# Patient Record
Sex: Female | Born: 1935 | Race: White | Hispanic: No | Smoking: Never smoker
Health system: Southern US, Community
[De-identification: ages and names within clinical notes are randomized; demographics above are authoritative.]

## PROBLEM LIST (undated history)

## (undated) DIAGNOSIS — K224 Dyskinesia of esophagus: Secondary | ICD-10-CM

## (undated) DIAGNOSIS — E1129 Type 2 diabetes mellitus with other diabetic kidney complication: Principal | ICD-10-CM

## (undated) DIAGNOSIS — L309 Dermatitis, unspecified: Secondary | ICD-10-CM

## (undated) DIAGNOSIS — Z950 Presence of cardiac pacemaker: Secondary | ICD-10-CM

## (undated) DIAGNOSIS — I452 Bifascicular block: Secondary | ICD-10-CM

## (undated) DIAGNOSIS — Z9181 History of falling: Secondary | ICD-10-CM

## (undated) DIAGNOSIS — K08109 Complete loss of teeth, unspecified cause, unspecified class: Secondary | ICD-10-CM

## (undated) DIAGNOSIS — I639 Cerebral infarction, unspecified: Secondary | ICD-10-CM

## (undated) DIAGNOSIS — F411 Generalized anxiety disorder: Secondary | ICD-10-CM

## (undated) DIAGNOSIS — I5032 Chronic diastolic (congestive) heart failure: Secondary | ICD-10-CM

## (undated) DIAGNOSIS — D649 Anemia, unspecified: Secondary | ICD-10-CM

## (undated) DIAGNOSIS — E785 Hyperlipidemia, unspecified: Secondary | ICD-10-CM

## (undated) DIAGNOSIS — I350 Nonrheumatic aortic (valve) stenosis: Secondary | ICD-10-CM

## (undated) DIAGNOSIS — N183 Chronic kidney disease, stage 3 unspecified: Secondary | ICD-10-CM

## (undated) DIAGNOSIS — K219 Gastro-esophageal reflux disease without esophagitis: Secondary | ICD-10-CM

## (undated) DIAGNOSIS — L299 Pruritus, unspecified: Secondary | ICD-10-CM

## (undated) DIAGNOSIS — M545 Low back pain, unspecified: Secondary | ICD-10-CM

## (undated) DIAGNOSIS — E611 Iron deficiency: Secondary | ICD-10-CM

## (undated) DIAGNOSIS — H9190 Unspecified hearing loss, unspecified ear: Secondary | ICD-10-CM

## (undated) DIAGNOSIS — R269 Unspecified abnormalities of gait and mobility: Secondary | ICD-10-CM

## (undated) DIAGNOSIS — M25519 Pain in unspecified shoulder: Secondary | ICD-10-CM

## (undated) DIAGNOSIS — I219 Acute myocardial infarction, unspecified: Secondary | ICD-10-CM

## (undated) DIAGNOSIS — I10 Essential (primary) hypertension: Secondary | ICD-10-CM

## (undated) DIAGNOSIS — E1165 Type 2 diabetes mellitus with hyperglycemia: Principal | ICD-10-CM

## (undated) DIAGNOSIS — K648 Other hemorrhoids: Secondary | ICD-10-CM

## (undated) DIAGNOSIS — I480 Paroxysmal atrial fibrillation: Secondary | ICD-10-CM

## (undated) DIAGNOSIS — N393 Stress incontinence (female) (male): Secondary | ICD-10-CM

## (undated) DIAGNOSIS — Z973 Presence of spectacles and contact lenses: Secondary | ICD-10-CM

## (undated) DIAGNOSIS — N390 Urinary tract infection, site not specified: Secondary | ICD-10-CM

## (undated) DIAGNOSIS — Z972 Presence of dental prosthetic device (complete) (partial): Secondary | ICD-10-CM

## (undated) DIAGNOSIS — I951 Orthostatic hypotension: Secondary | ICD-10-CM

## (undated) DIAGNOSIS — I739 Peripheral vascular disease, unspecified: Secondary | ICD-10-CM

## (undated) DIAGNOSIS — E669 Obesity, unspecified: Secondary | ICD-10-CM

## (undated) DIAGNOSIS — R945 Abnormal results of liver function studies: Secondary | ICD-10-CM

## (undated) DIAGNOSIS — M1990 Unspecified osteoarthritis, unspecified site: Secondary | ICD-10-CM

## (undated) DIAGNOSIS — I779 Disorder of arteries and arterioles, unspecified: Secondary | ICD-10-CM

## (undated) DIAGNOSIS — F3289 Other specified depressive episodes: Secondary | ICD-10-CM

## (undated) DIAGNOSIS — R413 Other amnesia: Secondary | ICD-10-CM

## (undated) DIAGNOSIS — I2510 Atherosclerotic heart disease of native coronary artery without angina pectoris: Secondary | ICD-10-CM

## (undated) DIAGNOSIS — F329 Major depressive disorder, single episode, unspecified: Secondary | ICD-10-CM

## (undated) DIAGNOSIS — Z7901 Long term (current) use of anticoagulants: Secondary | ICD-10-CM

## (undated) HISTORY — DX: Chronic kidney disease, stage 3 unspecified: N18.30

## (undated) HISTORY — DX: Generalized anxiety disorder: F41.1

## (undated) HISTORY — DX: Pruritus, unspecified: L29.9

## (undated) HISTORY — DX: Bifascicular block: I45.2

## (undated) HISTORY — DX: Pain in unspecified shoulder: M25.519

## (undated) HISTORY — DX: Stress incontinence (female) (male): N39.3

## (undated) HISTORY — DX: Anemia, unspecified: D64.9

## (undated) HISTORY — DX: Chronic kidney disease, stage 3 (moderate): N18.3

## (undated) HISTORY — DX: History of falling: Z91.81

## (undated) HISTORY — DX: Orthostatic hypotension: I95.1

## (undated) HISTORY — DX: Low back pain, unspecified: M54.50

## (undated) HISTORY — DX: Dermatitis, unspecified: L30.9

## (undated) HISTORY — DX: Other hemorrhoids: K64.8

## (undated) HISTORY — DX: Abnormal results of liver function studies: R94.5

## (undated) HISTORY — DX: Type 2 diabetes mellitus with other diabetic kidney complication: E11.29

## (undated) HISTORY — PX: COLONOSCOPY: SHX174

## (undated) HISTORY — DX: Nonrheumatic aortic (valve) stenosis: I35.0

## (undated) HISTORY — DX: Other specified depressive episodes: F32.89

## (undated) HISTORY — PX: EXTREMITY CYST EXCISION: SHX1558

## (undated) HISTORY — PX: CATARACT EXTRACTION W/ INTRAOCULAR LENS  IMPLANT, BILATERAL: SHX1307

## (undated) HISTORY — PX: INSERT / REPLACE / REMOVE PACEMAKER: SUR710

## (undated) HISTORY — DX: Obesity, unspecified: E66.9

## (undated) HISTORY — DX: Low back pain: M54.5

## (undated) HISTORY — PX: EYE SURGERY: SHX253

## (undated) HISTORY — DX: Essential (primary) hypertension: I10

## (undated) HISTORY — DX: Other amnesia: R41.3

## (undated) HISTORY — DX: Unspecified abnormalities of gait and mobility: R26.9

## (undated) HISTORY — DX: Type 2 diabetes mellitus with hyperglycemia: E11.65

## (undated) HISTORY — DX: Hyperlipidemia, unspecified: E78.5

## (undated) HISTORY — DX: Major depressive disorder, single episode, unspecified: F32.9

---

## 1998-02-05 ENCOUNTER — Ambulatory Visit (HOSPITAL_BASED_OUTPATIENT_CLINIC_OR_DEPARTMENT_OTHER): Admission: RE | Admit: 1998-02-05 | Discharge: 1998-02-05 | Payer: Self-pay | Admitting: Orthopedic Surgery

## 2000-10-23 ENCOUNTER — Emergency Department (HOSPITAL_COMMUNITY): Admission: EM | Admit: 2000-10-23 | Discharge: 2000-10-23 | Payer: Self-pay | Admitting: Emergency Medicine

## 2000-10-23 ENCOUNTER — Encounter: Payer: Self-pay | Admitting: Emergency Medicine

## 2001-08-20 ENCOUNTER — Encounter: Payer: Self-pay | Admitting: Emergency Medicine

## 2001-08-20 ENCOUNTER — Emergency Department (HOSPITAL_COMMUNITY): Admission: EM | Admit: 2001-08-20 | Discharge: 2001-08-20 | Payer: Self-pay | Admitting: Emergency Medicine

## 2002-01-11 ENCOUNTER — Encounter (INDEPENDENT_AMBULATORY_CARE_PROVIDER_SITE_OTHER): Payer: Self-pay | Admitting: *Deleted

## 2002-01-11 ENCOUNTER — Ambulatory Visit (HOSPITAL_BASED_OUTPATIENT_CLINIC_OR_DEPARTMENT_OTHER): Admission: RE | Admit: 2002-01-11 | Discharge: 2002-01-11 | Payer: Self-pay | Admitting: Orthopedic Surgery

## 2003-12-02 ENCOUNTER — Inpatient Hospital Stay (HOSPITAL_COMMUNITY): Admission: AD | Admit: 2003-12-02 | Discharge: 2003-12-03 | Payer: Self-pay | Admitting: Emergency Medicine

## 2003-12-03 HISTORY — PX: CARDIAC CATHETERIZATION: SHX172

## 2005-06-11 ENCOUNTER — Emergency Department (HOSPITAL_COMMUNITY): Admission: EM | Admit: 2005-06-11 | Discharge: 2005-06-11 | Payer: Self-pay | Admitting: Emergency Medicine

## 2007-03-16 ENCOUNTER — Emergency Department (HOSPITAL_COMMUNITY): Admission: EM | Admit: 2007-03-16 | Discharge: 2007-03-16 | Payer: Self-pay | Admitting: Emergency Medicine

## 2009-08-14 ENCOUNTER — Emergency Department (HOSPITAL_COMMUNITY): Admission: EM | Admit: 2009-08-14 | Discharge: 2009-08-15 | Payer: Self-pay | Admitting: Emergency Medicine

## 2010-03-27 ENCOUNTER — Emergency Department (HOSPITAL_COMMUNITY): Admission: EM | Admit: 2010-03-27 | Discharge: 2010-03-27 | Payer: Self-pay | Admitting: Emergency Medicine

## 2010-05-04 ENCOUNTER — Emergency Department (HOSPITAL_COMMUNITY): Admission: EM | Admit: 2010-05-04 | Discharge: 2010-05-04 | Payer: Self-pay | Admitting: Oncology

## 2010-12-26 LAB — RAPID URINE DRUG SCREEN, HOSP PERFORMED
Barbiturates: NOT DETECTED
Benzodiazepines: POSITIVE — AB
Cocaine: NOT DETECTED
Opiates: NOT DETECTED
Tetrahydrocannabinol: NOT DETECTED

## 2010-12-26 LAB — DIFFERENTIAL
Basophils Relative: 1 % (ref 0–1)
Eosinophils Absolute: 0.2 10*3/uL (ref 0.0–0.7)
Eosinophils Relative: 3 % (ref 0–5)
Lymphocytes Relative: 33 % (ref 12–46)
Monocytes Relative: 8 % (ref 3–12)
Neutro Abs: 3.5 10*3/uL (ref 1.7–7.7)

## 2010-12-26 LAB — BASIC METABOLIC PANEL
CO2: 25 mEq/L (ref 19–32)
Calcium: 9.5 mg/dL (ref 8.4–10.5)
Chloride: 109 mEq/L (ref 96–112)
Potassium: 4.2 mEq/L (ref 3.5–5.1)

## 2010-12-26 LAB — CBC
MCV: 90.9 fL (ref 78.0–100.0)
RBC: 4.19 MIL/uL (ref 3.87–5.11)
RDW: 16.9 % — ABNORMAL HIGH (ref 11.5–15.5)

## 2010-12-26 LAB — SALICYLATE LEVEL: Salicylate Lvl: <4 mg/dL (ref 2.8–20.0)

## 2010-12-26 LAB — ETHANOL: Alcohol, Ethyl (B): <5 mg/dL (ref 0–10)

## 2010-12-26 LAB — TRICYCLICS SCREEN, URINE: TCA Scrn: NOT DETECTED

## 2011-01-13 LAB — URINALYSIS, ROUTINE W REFLEX MICROSCOPIC
Bilirubin Urine: NEGATIVE
Glucose, UA: NEGATIVE mg/dL
Hgb urine dipstick: NEGATIVE
Ketones, ur: NEGATIVE mg/dL
Nitrite: NEGATIVE
Protein, ur: 30 mg/dL — AB
Specific Gravity, Urine: 1.024 (ref 1.005–1.030)
Urobilinogen, UA: 1 mg/dL (ref 0.0–1.0)
pH: 6 (ref 5.0–8.0)

## 2011-01-13 LAB — DIFFERENTIAL
Basophils Relative: 1 % (ref 0–1)
Eosinophils Absolute: 0.2 10*3/uL (ref 0.0–0.7)
Lymphs Abs: 3.5 10*3/uL (ref 0.7–4.0)
Monocytes Absolute: 0.7 10*3/uL (ref 0.1–1.0)
Neutro Abs: 3.4 10*3/uL (ref 1.7–7.7)

## 2011-01-13 LAB — BASIC METABOLIC PANEL WITH GFR
BUN: 19 mg/dL (ref 6–23)
CO2: 29 meq/L (ref 19–32)
Calcium: 9.5 mg/dL (ref 8.4–10.5)
Chloride: 102 meq/L (ref 96–112)
Creatinine, Ser: 0.96 mg/dL (ref 0.4–1.2)
GFR calc non Af Amer: 57 mL/min — ABNORMAL LOW
Glucose, Bld: 142 mg/dL — ABNORMAL HIGH (ref 70–99)
Potassium: 3.9 meq/L (ref 3.5–5.1)
Sodium: 138 meq/L (ref 135–145)

## 2011-01-13 LAB — RAPID URINE DRUG SCREEN, HOSP PERFORMED
Amphetamines: NOT DETECTED
Barbiturates: NOT DETECTED
Benzodiazepines: NOT DETECTED
Opiates: NOT DETECTED

## 2011-01-13 LAB — CBC
HCT: 39.5 % (ref 36.0–46.0)
Hemoglobin: 13.2 g/dL (ref 12.0–15.0)
MCHC: 33.4 g/dL (ref 30.0–36.0)
MCV: 91.1 fL (ref 78.0–100.0)
Platelets: 152 10*3/uL (ref 150–400)
RBC: 4.34 MIL/uL (ref 3.87–5.11)
RDW: 15.1 % (ref 11.5–15.5)
WBC: 7.8 10*3/uL (ref 4.0–10.5)

## 2011-01-13 LAB — URINE MICROSCOPIC-ADD ON

## 2011-01-13 LAB — ETHANOL

## 2011-02-26 NOTE — Op Note (Signed)
[name]. [facility]  Patient:    Danielle Benitez, Danielle Benitez Visit Number: [id number] MRN: [id number]          Service Type: DSU Location: [facility] Attending Physician:  Ronne Binning Dictated by:   Nicki Reaper, M.D. Proc. Date: 01/11/02 Admit Date:  01/11/2002 Discharge Date: 01/11/2002   CC:         (2)   Operative Report  PREOPERATIVE DIAGNOSIS: Mass, right middle finger.  POSTOPERATIVE DIAGNOSIS: Mass, right middle finger.  OPERATION/PROCEDURE: Excision of mass of distal interphalangeal joint and debridement of joint, right middle finger.  SURGEON: Nicki Reaper, M.D.  ASSISTANT: Joaquin Courts, R.N.  ANESTHESIA: Forearm-based IV regional.  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  DESCRIPTION OF PROCEDURE: The patient was brought to the operating room, where a forearm-based IV regional anesthetic was carried out without difficulty. She was prepped and draped using Betadine scrubbing solution with the right arm free in the supine position.  A curvilinear incision was made over the mass and carried down through subcutaneous tissue.  A large gouty tophus was immediately encountered.  With blunt and sharp dissection this was dissected free, having eroded through the radial side of the capsule.  A large exostosis was present.  This was removed with a rongeur, including a portion of bone. The remainder of the joint was inspected and no further lesions were identified.  The wound was irrigated.  The skin was then closed with interrupted 5-0 nylon suture.  A sterile compressive dressing and splint were applied to the finger.  The patient tolerated the procedure well and was taken to the recovery room for observation in satisfactory condition.  She is discharged home to return to the [facility] in one week, on Vicodin and Keflex. Dictated by:   Nicki Reaper, M.D. Attending Physician:  Ronne Binning DD:  01/11/02 TD:  01/11/02 Job: [id number]

## 2011-02-26 NOTE — H&P (Signed)
NAME:  Danielle Benitez, Danielle Benitez NO.:  [id number]   MEDICAL RECORD NO.:  [id number]                   PATIENT TYPE:  EMS   LOCATION:  [facility]                                 FACILITY:  [facility]   PHYSICIAN:  Rosanne Sack, M.D.         DATE OF BIRTH:  04/24/1932   DATE OF ADMISSION:  12/02/2003  DATE OF DISCHARGE:                                HISTORY & PHYSICAL   CHIEF COMPLAINT:  Chest pain and tightness.   PROBLEM LIST:  1. Chest pain at rest with radiation to the left arm, left arm numbness but     no diaphoresis on December 01, 2003.  This is now followed with sensation     of chest tightness.     a. History of chest pain distantly with emergency room visit at that        time.  She was discharged home with instructions to follow with Dr.        Lucas Mallow, cardiology but did not see him.  She had sinus bradycardia per        electrocardiogram in 2002 at 48 per minute.  2. Complaints of dysuria, rule out urinary tract infection.  Patient     complaining of frequency and burning.  3. Uncontrolled diabetes.  Serum glucose 188.     a. History of diabetes on oral agents only.  Does not follow home CBG's.  4. Normocytic anemia unclear baseline hemoglobin, current hemoglobin 11.2.  5. Uncontrolled hypertension, systolic blood pressures in the 170's.  6. Non healed ulcers lower abdomen and legs the patient states from old bug     bites.  7. History diverticulosis and hemorrhoids.     a. Flexible sigmoidoscopy August 1989 noting diverticulosis.     b. Barium enema 1987 noting diverticulosis.  8. History of abnormal liver function tests in 1991.  9. History of gout.  10.      History of allergic conjunctivitis.  11.      History chronic back pain.  12.      History osteoarthritis with chronic knee pain.  13.      History fibromyalgia.  14.      Obesity.  15.      ALLERGIES/intolerance's:  Had bad reaction to beta blocker.  16.      CODE STATUS:  Patient  currently full code.   HISTORY OF PRESENT ILLNESS:  This 77 year old female is followed as an  outpatient by Dr. Murray Hodgkins of [facility].  She lives at  home independently.  She is accompanied to the emergency room by her  daughter.  She states she had central chest pain on December 01, 2003.  She  states this was at rest while sitting at a table. There was radiation of  pain to the left arm and then left arm numbness.  She denies diaphoresis or  radiation to the jaw.  She  states that the pain gradually subsided and then  was followed with a sensation of chest tightness which she still currently  feels. She also states she has been having symptomatology consistent with  dysuria with increased frequency and burning.  Finally, she has complaints  of some chronic ulcers of lower abdomen and lower legs bilaterally from old  bug bites.  She came to the emergency room today to have her chest  pain/tightness evaluated.   FAMILY HISTORY:  Noncontributory in this 77 year old female.   SOCIAL HISTORY:  The patient recently widowed in November of 2004.  She has  two children. She has no history of alcohol or tobacco use.  She is followed  in primary care by Dr. Chilton Si.   REVIEW OF SYMPTOMS:  HEENT:  Denies any fever or weight change.  Denies any  falls or trauma.  Denies any syncope associated with her complaints of chest  pain.  EYES:  Wears corrective lenses.  Has had no previous eye surgery.  Ears with no hearing  deficit or use of hearing aids.  Nose with no rhinitis  or sinusitis.  Mouth/throat:  No pain with swallowing.  Denies dysphagia.  HEART:  As described under history of present illness.  The patient also has  a listed history of hypertension which is currently uncontrolled.  LUNGS:  No shortness of breath or cough.  ABDOMEN:  No nausea or vomiting.  No  abdominal pain.  No tarry stools or frank rectal bleeding.  Does have a  listed history of diverticulosis.   UROGENITAL:  As under history of present  illness.  MUSCULOSKELETAL:  Chronic low back and knee pain with listed  history of osteoarthritis.  Denies any falls or trauma.  NEUROLOGICAL:  No  history of cerebrovascular accident or seizure.  Denies peripheral  neuropathy.  INTEGUMENTARY:  Some chronic nonhealing ulcers of lower abdomen  and lower extremities from old bug bites.  Denies any skin cancers.   MEDICATIONS AT TIME OF ADMISSION:  1. Lotrel 5/20 mg one tablet daily.  2. DiaBeta 2.5 mg taking one-half tablet daily in A.M.  3. ___________ for loose stools p.r.n.  4. Tylenol PM as needed.  5. Aleve twice daily.  6. Allopurinol 300 mg daily.  7. Claritin 10 mg daily as needed.  8. Gas-X as needed.   LABORATORY DATA:  Chest x-ray notes bibasilar atelectasis.  Twelve lead  electrocardiogram in 2002 noted sinus bradycardia with a left anterior  block.  Today's electrocardiogram notes rate of 60 per minute, normal sinus  rhythm with left anterior fascicular block.  ISTAT blood gas ph 7.410, pCO2  43.7, bicarb 27.7, total cO2 29.  ISTAT metabolic values sodium 138,  potassium 4.1, chloride 102, BUN 14, creatinine 1.  Glucose was elevated at  188.  A CBC found white blood cell count 5.9, hemoglobin 11.2, hematocrit  34.9, platelet count 222,000.  Differential was unremarkable.  Emergency  room cardiac enzymes X3 sets were unremarkable with CK-MB initially 1.2 and  trending down to less than 1.  Troponin levels all three less than 0.05.  Myoglobin's were unremarkable.   PHYSICAL EXAMINATION:  VITAL SIGNS:  Temperature 98.0. Pulse 69.  Respirations 20.  Blood pressure elevated at 175/79.  Oxygen saturations on  low flow nasal cannula oxygen 96%.  GENERAL: Obese elderly female in no acute distress.  She is alert and  oriented.  HEENT:  Head normocephalic.  Eyes: Pupils equal and reactive.  Red light reflexes bilaterally.  Extraocular  muscles intact.  Ears: Canals are clear.  Hearing  grossly normal.  Nose: Nares patent without masses or discharge  noted. Oral mucosa pink and moist.  NECK:  No jugular venous distention or bruits noted.  No cervical  adenopathy.  Thyroid nonpalpable.  HEART:  Rate and rhythm regular with 1/6 systolic ejection murmur heard best  over the right upper sternal border.  Mild bradycardia during the course of  examination with apical pulse 56 to 58.  There is no edema noted.  LUNGS:  Breath sounds clear and equal bilaterally.  No distress or cough.  CHEST:  No axillary adenopathy.  Breast examination is deferred as  noncontributory.  ABDOMEN:  Soft and obese with positive bowel sounds.  No pain with  palpation.  No organomegaly noted.  GENITOURINARY:  No obvious bladder pain or costovertebral angle tenderness.  Genital examination deferred as noncontributory.  RECTAL: Examination deferred as noncontributory.  MUSCULOSKELETAL:  Range of motion full.  Strength is 5/5 and equal x4  extremities.  Some crepitus in the knee joints.  NEUROLOGICAL:  Cranial nerves II-XII grossly intact.  No obvious unilateral  or focal defects.  Soft touch is intact to the toes bilaterally.  INTEGUMENTARY:  Skin is warm and dry.  Turgor appears adequate.  There are  some stage 2 unhealed ulcers over the lower extremities.  Also similar  ulcers are found to the distal lower extremities bilaterally.  None of these  sites appear infected.   IMPRESSION AND PLANS:  1. Complaints of chest pain, rule out myocardial infarction.  Rule out     unstable angina with chest pain at rest December 01, 2003.  Now complains     of chest tightness.  There was radiation to the left arm initially then     left arm numbness.  No diaphoresis or radiation to the jaw.  Twelve lead     electrocardiogram shows no significant change since 2002 tracings.     Cardiac enzymes are negative X3 sets in the emergency room.  Will     admission to telemetry bed service of Dr. Nolon Rod Monguilod,  [phone].  Follow with three additional sets of full cardiac enzymes.     Cardiology consultation per her daughter's request, Dr. Charolette Child.     Would initiate aspirin and nitroglycerin.  Heparin drip per cardiology.     Would follow with 12 lead electrocardiogram in the A.M. Would check a     serum lipid panel for hyperlipidemia and initiate statin therapy if     indicated.  Patient is not a good candidate for beta blocker due to     previous intolerance which is not further defined by the patient.     Currently on ACE inhibitor and Norvasc.  Not a good candidate for     Cardizem due to mild bradycardia currently.  Would continue her current     combination ACE inhibitor and Norvasc and follow for any further     recommendations by cardiology.  2. Complaints of dysuria with increased frequency and burning.  Will check a     urinalysis, culture and sensitivity and follow for results.  No current    indication for antibiotics with patient afebrile and no leukocytosis.  3. Uncontrolled diabetes.  Serum glucose currently 188.  Does not check home     CBG's.  Is taking oral DiaBeta.  Will initiate with carbohydrate-modified     diet, low sodium, low fat, low  cholesterol.  Continue DiaBeta and follow     with four time daily CBG's and sliding scale insulin.  Adjust medications     as needed.  Will check a hemoglobin A1C post admission.  4. Uncontrolled hypertension.  Systolic blood pressure currently 175.  Will     admit and follow blood pressures.  If blood pressures remain this     elevated would increase Lotrel to maximal dose 10/20 mg.  5. Normocytic anemia with current hemoglobin 11.2.  Unsure of previous     baseline hemoglobin.  Patient is listed taking routine Aleve twice daily.     Would hold Aleve and follow for stool occult bloods.  Recheck hemoglobin     in A.M.  6. Superficial abdominal and leg ulcers.  No indication of cellulitis per     examination.  Sites reported from  old bug bites.  Would use a topical     agent such as triple antibiotic ointment.  7. CODE STATUS:  Patient currently full code.   DISPOSITION:  Telemetry bed service Dr. Nolon Rod Monguilod, [phone].   ACTIVITY:  Up as tolerated to bathroom on telemetry.   DIET:  Carbohydrate-modified, low fat, low cholesterol, low sodium.   CONDITION ON ADMISSION:  Guarded.      Everett Graff, N.P.                     Rosanne Sack, M.D.    TC/MEDQ  D:  12/02/2003  T:  12/02/2003  Job:  [id number]

## 2011-02-26 NOTE — Discharge Summary (Signed)
NAME:  Danielle Benitez, Danielle Benitez                             ACCOUNT NO.:  [id number]   MEDICAL RECORD NO.:  [id number]                   PATIENT TYPE:  INP   LOCATION:  2001                                 FACILITY:  [facility]   PHYSICIAN:  Rosanne Sack, M.D.         DATE OF BIRTH:  12-16-1931   DATE OF ADMISSION:  12/02/2003  DATE OF DISCHARGE:  12/03/2003                                 DISCHARGE SUMMARY   CHIEF COMPLAINT:  1. Chest pain and tightness.     a. A 12-lead EKG essentially unchanged, cardiac enzymes negative.     b. Status post cardiology consult [facility].     c. A cardiac catheterization, done by Dr. Clarene Duke December 03, 2003, notes        no arterial disease except for proximal RCA less than 30% occlusion.        Felt this does not explain symptomatology.     d. Complaints of chest pain, felt possibly due to GERD with proton pump        inhibitor prescribed and continued post discharge.     e. Previous history complaints of chest pain with emergency room visit        distantly.  Was instructed to follow with Dr. Lucas Mallow cardiology at that        time but did not see him post discharge.  2. Uncontrolled diabetes.     a. Hemoglobin A1c 9.4 over the course of hospitalization.     b. DiaBeta dose increased from one half of a 2.5 mg tablet to full 2.5 mg        tablet at discharge.     c. Requested followup diabetic control 1-2 weeks post discharge at        [facility].  3. Uncontrolled hypertension. Systolic blood pressures, at the time of     admission, 175 range, question secondary to anxiety.     a. Blood pressures well controlled over hospitalization in the 120-130        range systolic.  Previous medications continued post discharge with        requested followup blood pressure control post discharge at [facility] office.  4. Complaints of dysuria, rule out urinary tract infection.     a. Urine microscopic 3-6 WBCs  with rate bacteria and rare yeast.     b. Will empirically start Cipro 250 mg twice daily for a five day course        at the time of discharge.  5. Normocytic anemia.  Hemoglobin in the 11-11.2 range over the course of     hospitalization.  No sign of acute bleed.     a. Stool occult blood requested but unfortunately not obtained prior to        discharge.  The patient was taking fairly routine Aleve prior to  hospitalization.     b. The patient requested not to utilize Aleve.  Request followup stool        occult blood, anemia indices, recheck hemoglobin next office visit at        [facility] 1-2 weeks post discharge.     c. If no indication of stool occult blood, consideration should be given        starting low-dose aspirin 81 mg daily.  6. Question hyperlipidemia.  The patient felt at increased risk due to age     and obesity.     a. Lipid panel obtained, over the course of hospitalization, but results        are currently pending at the time of discharge dictation.  Request        followup of lipid panel results post discharge by primary care        physician and anti-lipid medications utilized if indicated.  7. Superficial skin ulcers.     a. No indication of cellulitis.  The patient requested to utilize triple        antibiotic ointment twice daily until healed.  8. History of diverticulosis and hemorrhoids.     a. Flexible sigmoidoscopy, August 1989, noted diverticulosis.     b. Barium enema, 1987, noted diverticulosis.  9. Abnormal liver function tests, 1991.  10.      History of gout.  The patient continues on allopurinol prophylaxis.  11.      History of allergic conjunctivitis.  12.      History of chronic back pain.  13.      History of osteoarthritis with chronic knee pain.  14.      History of fibromyalgia.  15.      Obesity.   ALLERGIES/INTOLERANCE:  Patient states she had a bad reaction previously to:  1. BETA BLOCKERS.  2. INFLUENZA VACCINE.    CODE STATUS:  Patient currently a full code.   DISCHARGE MEDICATIONS:  1. Allopurinol 300 mg daily.  2. Protonix 40 mg daily.  3. Lotrel 5/20 mg once daily.  4. DiaBeta 2.5 mg, full tablet, daily a.m.  5. Cipro 250 mg twice daily for five days post discharge.  6. Triple antibiotic ointment over the counter to his skin ulcers twice     daily until healed.  7. Patient requested not to restart previous medication Aleve.  8. Restart p.r.n. medications as before.   SPECIAL DISCHARGE INSTRUCTIONS:  Tylenol p.r.n. over the counter for pain  management.   DISPOSITION:  Returning home where she lives independently.   ACTIVITY:  Minimize walking for 24 hours status post catheterization, then  light duty for the rest of the week with off work until Monday, February 29,  2005.   WOUND CARE:  Triple antibiotic ointment to skin ulcer as above.  She may  wash right groin catheter site with mild soap and water, pat dry.  Notify of  any increased swelling suggestive of a hematoma.  Notify any increased  redness, pain, fever greater than 100.5.   FOLLOW UP:  1. [facility] 1-2 weeks post discharge, call [phone] for an     appointment.  2. Needs followup post discharge at [facility] diabetic control,     anemia, blood pressure control as above.   CONSULTATIONS:  [facility].   PROCEDURES:  Cardiac catheterization, November 02, 2003, Dr. Clarene Duke with  results as above.   DISCHARGE LABORATORY:  A CBC, December 03, 2003,  found WBC 6.9, hemoglobin  11.0, hematocrit 33.9, platelets 205.  Cardiac enzymes unremarkable.  Hemoglobin A1c elevated at 9.4.  A metabolic panel, December 02, 2003,  sodium 138, potassium 4.0, chloride 102, CO2 27, BUN 11, and creatinine 0.9.  Urine microscopic exam as above.   HISTORY OF PRESENT ILLNESS:  A 77 year old female followed as an outpatient by Dr. Lenon Curt. Green at [facility]  She came to the emergency  room  after suffering central chest pain, December 01, 2003.  She states that  this was while at rest sitting at a table.  There was radiation to her left  arm and some left arm numbness.  She denied diaphoresis.  The pain gradually  subsided but this has been followed with the sensation of chest tightness  continuing to her presentation in the emergency room.  She also complained  of dysuria with increased frequency and burning.  Finally, she complained of  some non-healing ulcers lower legs and lower abdomen originally from bug  bites.  She was admitted to rule out MI.   For dictated family history, social history, review of systems, admission  medications, laboratory data, physical exam, impressions and plan, please  see dictated admission history and physical, dated December 02, 2003.   HOSPITAL COURSE:  1. Chest pain rule out MI.  The patient presented as described under history     of present illness.  Serial cardiac enzymes were obtained and these were     all unremarkable.  EKG did not appear changed from her previous EKG from     2002.  She was placed on the telemetry unit for further monitoring and     telemetry has been unremarkable.  She had a cardiac consult with     [facility].  Dr. Clarene Duke proceed with a cardiac     catheterization, December 03, 2003, noting essentially clear coronary     arteries except for less than 30% blockage proximal RCA.  A lipid panel     was requested but results are still pending at the time of discharge and     these should be followed up by primary care physician.  She is currently     on bed rest with a pressure dressing in place.  She is cleared for     discharge if the wound site is stable post pressure dressing removal and     with ambulation.  Wound care instructions as above.  Most likely     explanation for her presenting complaints is GERD.  The patient was     initiated on proton pump inhibitor and this will continue post  discharge.   1. Uncontrolled diabetes.  Hemoglobin A1c was obtained on admission and this     was elevated at 9.4.  Her blood glucose, labile and elevated, 140-204     range.  Her previous medication DiaBeta is increased to a full 2.5 mg     tab, from previous one half tab.  She does not follow finger stick CBGs     at home and is not interested in starting this.  She should have her     glucose control checked next office visit in 1-2 weeks at [facility].   1. Uncontrolled hypertension.  Systolic blood pressures somewhat elevated     174 on admission.  She was continued on her previous Lotrel 5/20 mg.     Post admission, her blood pressures  have been well controlled with     systolic pressures in the 120-130 range.  I have continued her previous     medications post discharge and would simply follow her blood pressure     control at [facility] office.  1. Dysuria complaints rule out UTI.  The patient did complain of dysuria     with increased frequency and burning.  A urine microscopic exam was     obtained with 3-6 WBCs, rare bacteria, rare yeast.  She has empirically     started Cipro 250 mg twice daily for a five day course at the time of     discharge.   1. Normocytic anemia.  The patient noted normocytic anemia, hemoglobin 11.2     on admission.  She had been taking routine Aleve for her arthritic     complaints.  Aleve was discontinued on admission.  Stool occult blood was     requested but unfortunately not obtained prior to discharge.  Followup     hemoglobin is stable at 11.0.  No sign of acute bleed.  I have requested     the patient refrain from utilizing Aleve post discharge.  Her arthritic     pain can be managed with Tylenol.  Would follow at [facility]     office where I suggest stool occult blood, anemia panel, recheck her     hemoglobin.  The primary care physician may wish to start low-dose     aspirin after a GI bleed is ruled out  per stool occult blood but will     defer to his discretion.   1. Superficial skin ulcers.  The patient states she obtained bug bites and     these have resulted in superficial ulcers lower abdomen and bilateral     distal legs.  She states she scratches these sites frequently due to     pruritus.  There was no sign of infection.  Recommend she use triple     antibiotic ointment topically to prevent infection also this will help     keep the sites moist and diminish her pruritus.   1. Code status.  The patient was full code over the course of the     hospitalization.   DISPOSITION:  Discharged home with followup as above.   ACTIVITY:  The patient to minimize ambulation 24 hours post cath, after that  light duty for the remainder of the week.  She may return to work on Monday,  February 29, 2005.   CONDITION AT TIME OF DISCHARGE:  Stable.   DISCHARGE PROCESS:  Greater than 30 minutes, 10 a.m. through 11 a.m.      Everett Graff, N.P.                     Rosanne Sack, M.D.    TC/MEDQ  D:  12/03/2003  T:  12/03/2003  Job:  [id number]   cc:   Thereasa Solo. Little, M.D.  [address]  [address]  [place]  [place] [address]  Fax: [phone]

## 2011-02-26 NOTE — Cardiovascular Report (Signed)
NAME:  Danielle Benitez, WUBBEN NO.:  [id number]   MEDICAL RECORD NO.:  [id number]                   PATIENT TYPE:  INP   LOCATION:  2001                                 FACILITY:  [facility]   PHYSICIAN:  Thereasa Solo. Little, M.D.              DATE OF BIRTH:  27-Mar-1932   DATE OF PROCEDURE:  12/03/2003  DATE OF DISCHARGE:                              CARDIAC CATHETERIZATION   INDICATIONS FOR TEST:  Ms. Schartz is a 77 year old diabetic female who  presented to the hospital with chest discomfort.  Her cardiac enzymes are  negative.  She is brought to the cath lab for evaluation of coronary  anatomy.   The patient was prepped and draped in the usual sterile fashion exposing the  right groin. Following local anesthetic with 1% Xylocaine, the Seldinger  technique was employed and a 5 [place] introducer sheath was placed into the  right femoral artery.  Left and right coronary arteriography,  ventriculography in the RAO projection and a distal aortogram was performed.  Distal aortogram was performed because of hypertension and because of some  mild tortuosity noted as the catheter traversed the bifurcation of the  iliacs.   COMPLICATIONS:  None.   EQUIPMENT:  5 French Judkins configuration catheters.   TOTAL CONTRAST:  105 mL.   RESULTS:   HEMODYNAMIC MONITORING:  Central aortic pressure was 158/64.  Left  ventricular pressure was 160/7 with  no significant gradient at the time of  pullback.   VENTRICULOGRAPHY:  Ventriculography in the RAO projection revealed normal LV  systolic function.  Ejection fraction was greater than 60%, end-diastolic  pressure 10.  No mitral regurgitation seen.   DISTAL AORTOGRAM:  Distal aortogram at the level of the renal showed no  evidence of renal artery stenosis.  No evidence of abdominal aortic aneurysm  and no disease at the bifurcation.   CORONARY ARTERIOGRAPHY:  On fluoroscopy, calcification was noted in the  proximal portion of  the circumflex and LAD.   1. Left main normal, it bifurcated.  2. LAD:  The left anterior descending went to the apex of the heart and gave     rise to a very large first diagonal.  This system was free of disease.     The LAD itself was around 3.5-4 mm.  3. Circumflex:  The circumflex was a large 4 mm vessel that gave rise to two     large OMs, both of which were free of disease.  4. Right coronary artery:  The right coronary artery was about 3.5 mm in     diameter.  Supplied the PDA and two posterior lateral branches.  There     was proximal 30% narrowing in several areas of the right coronary artery.     No high grade stenoses were seen.   CONCLUSION:  1. Normal left ventricular systolic function.  2. No abdominal aortic  aneurysm.  3. No renal artery stenosis.  4. Mild irregularities in the proximal portion of the right coronary artery     with no high grade stenoses.   At this point, I cannot explain her chest pain from a cardiac anatomy  standpoint.  Provided she does not have any bruising at the time of  introducer sheath removal, she could probably be discharged to home later  today.                                               Thereasa Solo. Little, M.D.    ABL/MEDQ  D:  12/03/2003  T:  12/03/2003  Job:  [id number]   cc:   Lenon Curt. Chilton Si, M.D.  [address]  [place]  [place] [address]  Fax: [phone]   Madaline Savage, M.D.  [phone]  [place]  [place] [address]  Fax: [phone]

## 2012-01-08 ENCOUNTER — Inpatient Hospital Stay (HOSPITAL_COMMUNITY)
Admission: EM | Admit: 2012-01-08 | Discharge: 2012-01-12 | DRG: 243 | Disposition: A | Discharge status: Home / Self Care | Payer: Medicare Other | Attending: Cardiovascular Disease | Admitting: Cardiovascular Disease

## 2012-01-08 ENCOUNTER — Emergency Department (HOSPITAL_COMMUNITY): Payer: Medicare Other

## 2012-01-08 ENCOUNTER — Encounter (HOSPITAL_COMMUNITY): Payer: Self-pay

## 2012-01-08 ENCOUNTER — Other Ambulatory Visit: Payer: Self-pay

## 2012-01-08 DIAGNOSIS — J4489 Other specified chronic obstructive pulmonary disease: Secondary | ICD-10-CM | POA: Diagnosis present

## 2012-01-08 DIAGNOSIS — I350 Nonrheumatic aortic (valve) stenosis: Secondary | ICD-10-CM | POA: Diagnosis present

## 2012-01-08 DIAGNOSIS — Z833 Family history of diabetes mellitus: Secondary | ICD-10-CM

## 2012-01-08 DIAGNOSIS — Y831 Surgical operation with implant of artificial internal device as the cause of abnormal reaction of the patient, or of later complication, without mention of misadventure at the time of the procedure: Secondary | ICD-10-CM | POA: Diagnosis not present

## 2012-01-08 DIAGNOSIS — Y9210 Unspecified residential institution as the place of occurrence of the external cause: Secondary | ICD-10-CM | POA: Diagnosis not present

## 2012-01-08 DIAGNOSIS — F329 Major depressive disorder, single episode, unspecified: Secondary | ICD-10-CM | POA: Diagnosis present

## 2012-01-08 DIAGNOSIS — IMO0001 Reserved for inherently not codable concepts without codable children: Secondary | ICD-10-CM | POA: Diagnosis present

## 2012-01-08 DIAGNOSIS — R42 Dizziness and giddiness: Secondary | ICD-10-CM

## 2012-01-08 DIAGNOSIS — Z950 Presence of cardiac pacemaker: Secondary | ICD-10-CM

## 2012-01-08 DIAGNOSIS — R0789 Other chest pain: Principal | ICD-10-CM | POA: Diagnosis present

## 2012-01-08 DIAGNOSIS — F3289 Other specified depressive episodes: Secondary | ICD-10-CM | POA: Diagnosis present

## 2012-01-08 DIAGNOSIS — D649 Anemia, unspecified: Secondary | ICD-10-CM | POA: Diagnosis present

## 2012-01-08 DIAGNOSIS — R51 Headache: Secondary | ICD-10-CM

## 2012-01-08 DIAGNOSIS — IMO0002 Reserved for concepts with insufficient information to code with codable children: Secondary | ICD-10-CM

## 2012-01-08 DIAGNOSIS — R0602 Shortness of breath: Secondary | ICD-10-CM | POA: Diagnosis present

## 2012-01-08 DIAGNOSIS — E1165 Type 2 diabetes mellitus with hyperglycemia: Secondary | ICD-10-CM | POA: Diagnosis present

## 2012-01-08 DIAGNOSIS — E1129 Type 2 diabetes mellitus with other diabetic kidney complication: Secondary | ICD-10-CM

## 2012-01-08 DIAGNOSIS — J449 Chronic obstructive pulmonary disease, unspecified: Secondary | ICD-10-CM | POA: Diagnosis present

## 2012-01-08 DIAGNOSIS — R5383 Other fatigue: Secondary | ICD-10-CM | POA: Diagnosis present

## 2012-01-08 DIAGNOSIS — I498 Other specified cardiac arrhythmias: Secondary | ICD-10-CM | POA: Diagnosis present

## 2012-01-08 DIAGNOSIS — I10 Essential (primary) hypertension: Secondary | ICD-10-CM | POA: Diagnosis present

## 2012-01-08 DIAGNOSIS — E119 Type 2 diabetes mellitus without complications: Secondary | ICD-10-CM | POA: Diagnosis present

## 2012-01-08 DIAGNOSIS — E785 Hyperlipidemia, unspecified: Secondary | ICD-10-CM | POA: Diagnosis present

## 2012-01-08 DIAGNOSIS — R001 Bradycardia, unspecified: Secondary | ICD-10-CM | POA: Diagnosis present

## 2012-01-08 DIAGNOSIS — I359 Nonrheumatic aortic valve disorder, unspecified: Secondary | ICD-10-CM | POA: Diagnosis present

## 2012-01-08 DIAGNOSIS — I2510 Atherosclerotic heart disease of native coronary artery without angina pectoris: Secondary | ICD-10-CM | POA: Diagnosis present

## 2012-01-08 HISTORY — DX: Unspecified osteoarthritis, unspecified site: M19.90

## 2012-01-08 HISTORY — DX: Presence of cardiac pacemaker: Z95.0

## 2012-01-08 HISTORY — DX: Reserved for concepts with insufficient information to code with codable children: IMO0002

## 2012-01-08 HISTORY — DX: Type 2 diabetes mellitus with other diabetic kidney complication: E11.29

## 2012-01-08 LAB — DIFFERENTIAL
Eosinophils Relative: 3 % (ref 0–5)
Lymphocytes Relative: 36 % (ref 12–46)
Lymphs Abs: 2.4 10*3/uL (ref 0.7–4.0)
Neutrophils Relative %: 55 % (ref 43–77)

## 2012-01-08 LAB — POCT I-STAT TROPONIN I

## 2012-01-08 LAB — POCT I-STAT, CHEM 8
BUN: 22 mg/dL (ref 6–23)
Creatinine, Ser: 1.1 mg/dL (ref 0.50–1.10)
Potassium: 4.4 mEq/L (ref 3.5–5.1)
Sodium: 142 mEq/L (ref 135–145)

## 2012-01-08 LAB — GLUCOSE, CAPILLARY
Glucose-Capillary: 56 mg/dL — ABNORMAL LOW (ref 70–99)
Glucose-Capillary: 126 mg/dL — ABNORMAL HIGH (ref 70–99)

## 2012-01-08 LAB — CBC
MCV: 90.7 fL (ref 78.0–100.0)
Platelets: 149 10*3/uL — ABNORMAL LOW (ref 150–400)
RBC: 4.21 MIL/uL (ref 3.87–5.11)
WBC: 6.7 10*3/uL (ref 4.0–10.5)

## 2012-01-08 LAB — CARDIAC PANEL(CRET KIN+CKTOT+MB+TROPI)
CK, MB: 2 ng/mL (ref 0.3–4.0)
Total CK: 71 U/L (ref 7–177)
Troponin I: <0.3 ng/mL (ref <0.30)

## 2012-01-08 MED ORDER — SODIUM CHLORIDE 0.9 % IV BOLUS (SEPSIS)
1000.0000 mL | Freq: Once | INTRAVENOUS | Status: AC
  Administered 2012-01-08: 1000 mL via INTRAVENOUS

## 2012-01-08 MED ORDER — ONDANSETRON HCL 4 MG PO TABS: 4.0000 mg | ORAL_TABLET | Freq: Four times a day (QID) | ORAL | Status: DC | PRN

## 2012-01-08 MED ORDER — INSULIN ASPART 100 UNIT/ML ~~LOC~~ SOLN: 0.0000 [IU] | Freq: Every day | SUBCUTANEOUS | Status: DC

## 2012-01-08 MED ORDER — ZOLPIDEM TARTRATE 5 MG PO TABS
5.0000 mg | ORAL_TABLET | Freq: Every evening | ORAL | Status: DC | PRN
  Administered 2012-01-08: 5 mg via ORAL
  Filled 2012-01-08: qty 1

## 2012-01-08 MED ORDER — INSULIN ASPART 100 UNIT/ML ~~LOC~~ SOLN
0.0000 [IU] | Freq: Three times a day (TID) | SUBCUTANEOUS | Status: DC
  Administered 2012-01-09 – 2012-01-11 (×2): 1 [IU] via SUBCUTANEOUS
  Administered 2012-01-11 – 2012-01-12 (×2): 3 [IU] via SUBCUTANEOUS

## 2012-01-08 MED ORDER — ONDANSETRON HCL 4 MG/2ML IJ SOLN
4.0000 mg | Freq: Four times a day (QID) | INTRAMUSCULAR | Status: DC | PRN
  Administered 2012-01-10: 4 mg via INTRAVENOUS
  Filled 2012-01-08: qty 2

## 2012-01-08 MED ORDER — NITROGLYCERIN 0.2 MG/HR TD PT24
0.2000 mg | MEDICATED_PATCH | Freq: Every day | TRANSDERMAL | Status: DC
  Administered 2012-01-09 (×2): 0.2 mg via TRANSDERMAL
  Filled 2012-01-08 (×3): qty 1

## 2012-01-08 MED ORDER — MORPHINE SULFATE 4 MG/ML IJ SOLN
4.0000 mg | Freq: Once | INTRAMUSCULAR | Status: AC
  Administered 2012-01-08: 4 mg via INTRAVENOUS
  Filled 2012-01-08: qty 1

## 2012-01-08 MED ORDER — OXYCODONE HCL 5 MG PO TABS
5.0000 mg | ORAL_TABLET | ORAL | Status: DC | PRN
  Administered 2012-01-09 – 2012-01-10 (×2): 5 mg via ORAL
  Filled 2012-01-08 (×2): qty 1

## 2012-01-08 MED ORDER — SODIUM CHLORIDE 0.9 % IJ SOLN
3.0000 mL | Freq: Two times a day (BID) | INTRAMUSCULAR | Status: DC
  Administered 2012-01-08 – 2012-01-10 (×4): 3 mL via INTRAVENOUS

## 2012-01-08 MED ORDER — ASPIRIN 325 MG PO TABS
325.0000 mg | ORAL_TABLET | Freq: Every day | ORAL | Status: DC
  Administered 2012-01-08: 325 mg via ORAL
  Filled 2012-01-08: qty 1

## 2012-01-08 MED ORDER — HYDROMORPHONE HCL PF 1 MG/ML IJ SOLN
0.5000 mg | INTRAMUSCULAR | Status: DC | PRN
  Administered 2012-01-10: 1 mg via INTRAVENOUS
  Filled 2012-01-08: qty 1

## 2012-01-08 MED ORDER — ACETAMINOPHEN 650 MG RE SUPP: 650.0000 mg | Freq: Four times a day (QID) | RECTAL | Status: DC | PRN

## 2012-01-08 MED ORDER — ACETAMINOPHEN 325 MG PO TABS
650.0000 mg | ORAL_TABLET | Freq: Four times a day (QID) | ORAL | Status: DC | PRN
  Administered 2012-01-09: 650 mg via ORAL
  Filled 2012-01-08: qty 2

## 2012-01-08 MED ORDER — ENOXAPARIN SODIUM 40 MG/0.4ML ~~LOC~~ SOLN
40.0000 mg | SUBCUTANEOUS | Status: DC
  Administered 2012-01-08 – 2012-01-09 (×2): 40 mg via SUBCUTANEOUS
  Filled 2012-01-08 (×4): qty 0.4

## 2012-01-08 MED ORDER — ASPIRIN EC 325 MG PO TBEC
325.0000 mg | DELAYED_RELEASE_TABLET | Freq: Every day | ORAL | Status: DC
  Administered 2012-01-09 – 2012-01-12 (×3): 325 mg via ORAL
  Filled 2012-01-08 (×4): qty 1

## 2012-01-08 MED ORDER — ALUM & MAG HYDROXIDE-SIMETH 200-200-20 MG/5ML PO SUSP: 30.0000 mL | Freq: Four times a day (QID) | ORAL | Status: DC | PRN

## 2012-01-08 MED ORDER — SODIUM CHLORIDE 0.9 % IV SOLN
INTRAVENOUS | Status: DC
  Administered 2012-01-08 – 2012-01-09 (×2): via INTRAVENOUS
  Administered 2012-01-10: 50 mL via INTRAVENOUS
  Administered 2012-01-10 – 2012-01-11 (×2): via INTRAVENOUS

## 2012-01-08 NOTE — ED Provider Notes (Signed)
  I performed a history and physical examination of Danielle Benitez and discussed her management with Dr. Criss Alvine.  I agree with the history, physical, assessment, and plan of care, with the following exceptions: None  Multiple risk factors and presents with cp and exertional dyspnea. Chest pain free on arrival. She's also had daily headaches which is gradually gotten worse. She has orthostatic dizziness. Examination is relatively unremarkable except for mild 2/6 systolic murmur. Manner of her workup relatively unremarkable. Examination otherwise unremarkable. She'll be admitted for observation  I was present for the following procedures: None Time Spent in Critical Care of the patient: None Time spent in discussions with the patient and family: 10 min  Brooke Dare, Roney Mans, MD 01/08/12 2025

## 2012-01-08 NOTE — ED Notes (Signed)
Pt. 's blood sugar is 56, orange juice given

## 2012-01-08 NOTE — ED Notes (Signed)
MD at bedside - Dr. Goldston 

## 2012-01-08 NOTE — ED Notes (Signed)
MD at bedside; Dr. King

## 2012-01-08 NOTE — H&P (Signed)
DATE OF ADMISSION:  01/08/2012  PCP:  [facility], Dr. Murray Hodgkins   Chief Complaint: Chest Pain and SOB   HPI: Danielle Benitez is an 78 y.o. female who presents to the ED with complaints of intermittent Chest pain over the past 24 hours, and SOB that she has had for several months.  She describes the chest pain as sharp and stabbing, and is substernal.  The pain last only for seconds and she rates the intensity of the pain as being a 6-7 out of 10.  The chest pain at times radiates into her right shoulder and down her arm.  And the pain occurs at rest.  She denies any previous similar pain.  She denies having any nausea vomiting or diaphoresis associated with the  Pain, but she does report having increased headaches and dizziness. Her ED workup revealed a negative troponin level, but an EKG with T wave inversions present in which it is not clear whether this is new or old.   A ct scan of the Brain was performed in the ED due to Her headache and Dizziness, she was also found to have orthostasis.    Past Medical History  Diagnosis Date  . Diabetes mellitus   . Hypertension   . Gout   . Osteoarthritis   . Bursitis     Past Surgical History  Procedure Date  . Extremity cyst excision     Medications:  HOME MEDS: Prior to Admission medications   Medication Sig Start Date End Date Taking? Authorizing Provider  allopurinol (ZYLOPRIM) 300 MG tablet Take 300 mg by mouth daily.   Yes Historical Provider, MD  furosemide (LASIX) 20 MG tablet Take 20 mg by mouth daily.   Yes Historical Provider, MD  glyBURIDE (DIABETA) 5 MG tablet Take 10 mg by mouth daily with breakfast.   Yes Historical Provider, MD  lisinopril (PRINIVIL,ZESTRIL) 40 MG tablet Take 40 mg by mouth daily.   Yes Historical Provider, MD  metFORMIN (GLUCOPHAGE) 500 MG tablet Take 500 mg by mouth 2 (two) times daily with a meal.   Yes Historical Provider, MD  sertraline (ZOLOFT) 50 MG tablet Take 50 mg by mouth daily.   Yes  Historical Provider, MD    Allergies:  Allergies  Allergen Reactions  . Fluzone (Flu Virus Vaccine) Other (See Comments)    Feeling unwell for a week. Cold symptoms/flu symtoms    Social History:   reports that she has never smoked. She does not have any smokeless tobacco history on file. She reports that she does not drink alcohol or use illicit drugs.  Family History: Family History  Problem Relation Age of Onset  . Breast cancer Mother   . Diabetes Daughter     Review of Systems:  The patient denies anorexia, fever, weight loss,, vision loss, decreased hearing, hoarseness, chest pain, syncope, dyspnea on exertion, peripheral edema, balance deficits, hemoptysis, abdominal pain, melena, hematochezia, severe indigestion/heartburn, hematuria, incontinence, genital sores, muscle weakness, suspicious skin lesions, transient blindness, difficulty walking, depression, unusual weight change, abnormal bleeding, enlarged lymph nodes, angioedema, and breast masses.   Physical Exam:  GEN:  Pleasant 78 year old elderly Caucasian  examined  and in no acute distress; cooperative with exam Filed Vitals:   01/08/12 1552 01/08/12 1800  BP: 133/64 154/64  Pulse: 85 55  Temp: 98.2 F (36.8 C) 98.1 F (36.7 C)  TempSrc: Oral Oral  Resp:  16  SpO2: 96% 99%   Blood pressure 154/64, pulse 55,  temperature 98.1 F (36.7 C), temperature source Oral, resp. rate 16, SpO2 99.00%. PSYCH: She is alert and oriented x4; does not appear anxious does not appear depressed; affect is normal HEENT: Normocephalic and Atraumatic, Mucous membranes pink; PERRLA; EOM intact; Fundi:  Benign;  No scleral icterus, Nares: Patent, Oropharynx: Clear, Neck:  FROM, no cervical lymphadenopathy nor thyromegaly or carotid bruit; no JVD; Breasts:: Not examined CHEST WALL: No tenderness CHEST: Normal respiration, clear to auscultation bilaterally HEART: Regular rate and rhythm; no murmurs rubs or gallops BACK: No kyphosis or  scoliosis; no CVA tenderness ABDOMEN: Positive Bowel Sounds, Soft non-tender; no masses, no organomegaly.   Rectal Exam: Not done EXTREMITIES: No cyanosis, clubbing or edema; no ulcerations. Genitalia: not examined PULSES: 2+ and symmetric SKIN: Normal hydration no rash or ulceration CNS: Cranial nerves 2-12 grossly intact no focal neurologic deficit   Labs & Imaging Results for orders placed during the hospital encounter of 01/08/12 (from the past 48 hour(s))  GLUCOSE, CAPILLARY     Status: Abnormal   Collection Time   01/08/12  4:10 PM      Component Value Range Comment   Glucose-Capillary 56 (*) 70 - 99 (mg/dL)    Comment 1 Notify RN     GLUCOSE, CAPILLARY     Status: Normal   Collection Time   01/08/12  4:30 PM      Component Value Range Comment   Glucose-Capillary 70  70 - 99 (mg/dL)   CBC     Status: Abnormal   Collection Time   01/08/12  4:59 PM      Component Value Range Comment   WBC 6.7  4.0 - 10.5 (K/uL)    RBC 4.21  3.87 - 5.11 (MIL/uL)    Hemoglobin 12.3  12.0 - 15.0 (g/dL)    HCT 40.9  81.1 - 91.4 (%)    MCV 90.7  78.0 - 100.0 (fL)    MCH 29.2  26.0 - 34.0 (pg)    MCHC 32.2  30.0 - 36.0 (g/dL)    RDW 78.2  95.6 - 21.3 (%)    Platelets 149 (*) 150 - 400 (K/uL)   DIFFERENTIAL     Status: Normal   Collection Time   01/08/12  4:59 PM      Component Value Range Comment   Neutrophils Relative 55  43 - 77 (%)    Neutro Abs 3.7  1.7 - 7.7 (K/uL)    Lymphocytes Relative 36  12 - 46 (%)    Lymphs Abs 2.4  0.7 - 4.0 (K/uL)    Monocytes Relative 7  3 - 12 (%)    Monocytes Absolute 0.4  0.1 - 1.0 (K/uL)    Eosinophils Relative 3  0 - 5 (%)    Eosinophils Absolute 0.2  0.0 - 0.7 (K/uL)    Basophils Relative 0  0 - 1 (%)    Basophils Absolute 0.0  0.0 - 0.1 (K/uL)   GLUCOSE, CAPILLARY     Status: Abnormal   Collection Time   01/08/12  5:05 PM      Component Value Range Comment   Glucose-Capillary 126 (*) 70 - 99 (mg/dL)    Comment 1 Documented in Chart      Comment 2  Notify RN     POCT I-STAT TROPONIN I     Status: Normal   Collection Time   01/08/12  5:12 PM      Component Value Range Comment   Troponin i, poc 0.00  0.00 - 0.08 (ng/mL)    Comment 3            POCT I-STAT, CHEM 8     Status: Abnormal   Collection Time   01/08/12  5:14 PM      Component Value Range Comment   Sodium 142  135 - 145 (mEq/L)    Potassium 4.4  3.5 - 5.1 (mEq/L)    Chloride 107  96 - 112 (mEq/L)    BUN 22  6 - 23 (mg/dL)    Creatinine, Ser 2.13  0.50 - 1.10 (mg/dL)    Glucose, Bld 086 (*) 70 - 99 (mg/dL)    Calcium, Ion 5.78  1.12 - 1.32 (mmol/L)    TCO2 24  0 - 100 (mmol/L)    Hemoglobin 12.2  12.0 - 15.0 (g/dL)    HCT 46.9  62.9 - 52.8 (%)    Dg Chest 2 View  01/08/2012  *RADIOLOGY REPORT*  Clinical Data: Chest pain.  CHEST - 2 VIEW  Comparison: 06/11/2005  Findings: Cardiomegaly. There is hyperinflation of the lungs compatible with COPD.  Diffuse peribronchial thickening again noted, likely chronic bronchitis.  No acute opacities or effusions. Degenerative changes in the thoracic spine and shoulders.  IMPRESSION: COPD/chronic bronchitis.  Cardiomegaly.  No active disease.  Original Report Authenticated By: Cyndie Chime, M.D.   Ct Head Wo Contrast  01/08/2012  *RADIOLOGY REPORT*  Clinical Data: Headache.  CT HEAD WITHOUT CONTRAST  Technique:  Contiguous axial images were obtained from the base of the skull through the vertex without contrast.  Comparison: 08/14/2009  Findings: Mild cerebral atrophy.  Physiologic calcifications in the basal ganglia, stable. No acute intracranial abnormality. Specifically, no hemorrhage, hydrocephalus, mass lesion, acute infarction, or significant intracranial injury.  No acute calvarial abnormality. Visualized paranasal sinuses and mastoids clear. Orbital soft tissues unremarkable.  IMPRESSION: No acute intracranial abnormality.  Original Report Authenticated By: Cyndie Chime, M.D.   EKG:  Normal Sinus Rhythm with Twave inversions in the  inferior leads   Assessment: Present on Admission:  .Chest pain at rest .SOB (shortness of breath) .Hypertension .Diabetes mellitus  Headaches  Orthostasis  Plan:    Admit to 23 hour Observation Telemetry Bed for Cardiac Rule Out Cardiac Enzymes, Nitropatch, O2, ASA Rx Continuous Pulse Oximetry Check orthostatic Vitals q shift Gentle IVFs Check Fasting Lipids in AM Reconcile Home Meds DVT Prophylaxis Other plans as per orders.    CODE STATUS:      FULL CODE         Lorian Yaun C 01/08/2012, 8:33 PM

## 2012-01-08 NOTE — ED Notes (Signed)
Pt. Has a hx of chest pain in the last 2days the pain has become worse. Rt. Side of chest radiates into her rt.shoulder. She is also dizzy, nauseated and fatiqued

## 2012-01-08 NOTE — ED Notes (Signed)
Per daughter at bedside, pt c/o headache "for a while," dizziness, nauseous, tremors, mid-sternum chest pain radiating to (R) shoulder, and sob w/exertion. Pt reports her chest pain woke her from sleep last night, increase chest pain w/raising (R) arm, pt reports hx of chronic (R) shoulder pain d/t an old injury. Pt reports she feels like she's off balance when walking, denies increase weakness to one side. No neuro deficits noted, no facial droop, slurred speech, or arm drift

## 2012-01-08 NOTE — ED Provider Notes (Signed)
History     CSN: [id number]  Arrival date & time 01/08/12  1543   First MD Initiated Contact with Patient 01/08/12 1640      Chief Complaint  Patient presents with  . Chest Pain    (Consider location/radiation/quality/duration/timing/severity/associated sxs/prior treatment) HPI Comments: 78 yo female with intermittent chest pain at rest since yesterday. Also having intermittent lightheadedness when standing with headache. No weakness, falls or trouble walking. Lightheadedness only when standing. No blurry vision. Chest pain not worse with exertion, but patient does get dyspneic every time she walks. Radiates to right shoulder. No prior history of heart disease. Was found to have glucose of 88 when she initially had lightheadedness. Got worse in triage, was given food, though despite blood sugar coming up patient's lightheadedness still persists when she stands.  Patient is a 78 y.o. female presenting with chest pain and neurologic complaint. The history is provided by the patient and a relative.  Chest Pain The chest pain began yesterday. Chest pain occurs frequently. The chest pain is unchanged. Associated with: nothing. The severity of the pain is moderate. The quality of the pain is described as sharp. The pain radiates to the right shoulder. Exacerbated by: nothing. Primary symptoms include nausea and dizziness. Pertinent negatives for primary symptoms include no fever, no shortness of breath (none now but has it every time she walks), no cough, no abdominal pain and no vomiting.  Dizziness also occurs with nausea. Dizziness does not occur with vomiting or weakness.   Pertinent negatives for associated symptoms include no weakness.    Neurologic Problem The primary symptoms include headaches (intermittent), dizziness and nausea. Primary symptoms do not include loss of consciousness, fever or vomiting. The symptoms began yesterday. The symptoms are unchanged. The neurological symptoms  are diffuse. The symptoms occurred after standing up.  The headache is not associated with neck stiffness or weakness.  Dizziness also occurs with nausea. Dizziness does not occur with vomiting or weakness.   Additional symptoms do not include neck stiffness, weakness or lower back pain.    Past Medical History  Diagnosis Date  . Diabetes mellitus   . Hypertension     History reviewed. No pertinent past surgical history.  No family history on file.  History  Substance Use Topics  . Smoking status: Never Smoker   . Smokeless tobacco: Not on file  . Alcohol Use: No    OB History    Grav Para Term Preterm Abortions TAB SAB Ect Mult Living                  Review of Systems  Constitutional: Negative for fever and chills.  HENT: Negative for congestion, rhinorrhea and neck stiffness.   Respiratory: Negative for cough, chest tightness and shortness of breath (none now but has it every time she walks).   Cardiovascular: Positive for chest pain. Negative for leg swelling.  Gastrointestinal: Positive for nausea. Negative for vomiting, abdominal pain and constipation.  Genitourinary: Negative for urgency, decreased urine volume and difficulty urinating.  Skin: Negative for wound.  Neurological: Positive for dizziness and headaches (intermittent). Negative for loss of consciousness and weakness.  Psychiatric/Behavioral: Negative for confusion.  All other systems reviewed and are negative.    Allergies  Fluzone  Home Medications   Current Outpatient Rx  Name Route Sig Dispense Refill  . ALLOPURINOL 300 MG PO TABS Oral Take 300 mg by mouth daily.    . FUROSEMIDE 20 MG PO TABS Oral Take 20 mg by  mouth daily.    . GLYBURIDE 5 MG PO TABS Oral Take 10 mg by mouth daily with breakfast.    . LISINOPRIL 40 MG PO TABS Oral Take 40 mg by mouth daily.    Marland Kitchen METFORMIN HCL 500 MG PO TABS Oral Take 500 mg by mouth 2 (two) times daily with a meal.    . SERTRALINE HCL 50 MG PO TABS Oral  Take 50 mg by mouth daily.      BP 133/64  Pulse 85  Temp(Src) 98.2 F (36.8 C) (Oral)  SpO2 96%  Physical Exam  Nursing note and vitals reviewed. Constitutional: She is oriented to person, place, and time. She appears well-developed and well-nourished. No distress.  HENT:  Head: Normocephalic and atraumatic.  Right Ear: External ear normal.  Left Ear: External ear normal.  Nose: Nose normal.  Mouth/Throat: Oropharynx is clear and moist.  Eyes: EOM are normal. Pupils are equal, round, and reactive to light.  Neck: Neck supple.  Cardiovascular: Normal rate, regular rhythm, normal heart sounds and intact distal pulses.   Pulmonary/Chest: Effort normal and breath sounds normal. No respiratory distress. She has no wheezes. She has no rales.  Abdominal: Soft. She exhibits no distension. There is no tenderness.  Musculoskeletal: She exhibits no edema.  Lymphadenopathy:    She has no cervical adenopathy.  Neurological: She is alert and oriented to person, place, and time. She has normal strength. No cranial nerve deficit or sensory deficit. She exhibits normal muscle tone. She displays a negative Romberg sign. Coordination and gait normal. GCS eye subscore is 4. GCS verbal subscore is 5. GCS motor subscore is 6.  Skin: Skin is warm and dry. She is not diaphoretic. No pallor.    ED Course  Procedures (including critical care time)  Labs Reviewed  GLUCOSE, CAPILLARY - Abnormal; Notable for the following:    Glucose-Capillary 56 (*)    All other components within normal limits  CBC - Abnormal; Notable for the following:    Platelets 149 (*)    All other components within normal limits  POCT I-STAT, CHEM 8 - Abnormal; Notable for the following:    Glucose, Bld 121 (*)    All other components within normal limits  GLUCOSE, CAPILLARY - Abnormal; Notable for the following:    Glucose-Capillary 126 (*)    All other components within normal limits  GLUCOSE, CAPILLARY  DIFFERENTIAL    POCT I-STAT TROPONIN I   Dg Chest 2 View  01/08/2012  *RADIOLOGY REPORT*  Clinical Data: Chest pain.  CHEST - 2 VIEW  Comparison: 06/11/2005  Findings: Cardiomegaly. There is hyperinflation of the lungs compatible with COPD.  Diffuse peribronchial thickening again noted, likely chronic bronchitis.  No acute opacities or effusions. Degenerative changes in the thoracic spine and shoulders.  IMPRESSION: COPD/chronic bronchitis.  Cardiomegaly.  No active disease.  Original Report Authenticated By: Cyndie Chime, M.D.   Ct Head Wo Contrast  01/08/2012  *RADIOLOGY REPORT*  Clinical Data: Headache.  CT HEAD WITHOUT CONTRAST  Technique:  Contiguous axial images were obtained from the base of the skull through the vertex without contrast.  Comparison: 08/14/2009  Findings: Mild cerebral atrophy.  Physiologic calcifications in the basal ganglia, stable. No acute intracranial abnormality. Specifically, no hemorrhage, hydrocephalus, mass lesion, acute infarction, or significant intracranial injury.  No acute calvarial abnormality. Visualized paranasal sinuses and mastoids clear. Orbital soft tissues unremarkable.  IMPRESSION: No acute intracranial abnormality.  Original Report Authenticated By: Cyndie Chime, M.D.  Date: 01/08/2012  Rate: 90  Rhythm: normal sinus rhythm  QRS Axis: left  Intervals: normal  ST/T Wave abnormalities: T wave inversions in AVL  Conduction Disutrbances:none  Narrative Interpretation:   Old EKG Reviewed: changes noted - new T wave inversions AVL only   1. Chest pain   2. Lightheadedness   3. Headache       MDM  EKG with mild changes from previous. ASA given, though no trop elevation. Doubt ACS, but due to age and risk factors will admit to [facility] for rule out. CT head negative (obtained for daily headaches). Lightheadedness likely related to dehydration and/or orthostasis. Given fluids. No evidence of vertigo or central nervous system etiology. Will admit to [facility].  Stable in ED.        Pricilla Loveless, MD 01/08/12 2022

## 2012-01-09 ENCOUNTER — Other Ambulatory Visit: Payer: Self-pay

## 2012-01-09 LAB — LIPID PANEL
Cholesterol: 168 mg/dL (ref 0–200)
HDL: 34 mg/dL — ABNORMAL LOW (ref >39)
LDL Cholesterol: 107 mg/dL — ABNORMAL HIGH (ref 0–99)
Total CHOL/HDL Ratio: 4.9 RATIO
Triglycerides: 137 mg/dL (ref <150)
VLDL: 27 mg/dL (ref 0–40)

## 2012-01-09 LAB — BASIC METABOLIC PANEL
BUN: 22 mg/dL (ref 6–23)
CO2: 25 mEq/L (ref 19–32)
GFR calc non Af Amer: 49 mL/min — ABNORMAL LOW (ref >90)
Glucose, Bld: 78 mg/dL (ref 70–99)
Potassium: 3.6 mEq/L (ref 3.5–5.1)
Sodium: 140 mEq/L (ref 135–145)

## 2012-01-09 LAB — GLUCOSE, CAPILLARY
Glucose-Capillary: 116 mg/dL — ABNORMAL HIGH (ref 70–99)
Glucose-Capillary: 157 mg/dL — ABNORMAL HIGH (ref 70–99)

## 2012-01-09 LAB — CBC
HCT: 30.3 % — ABNORMAL LOW (ref 36.0–46.0)
Hemoglobin: 9.8 g/dL — ABNORMAL LOW (ref 12.0–15.0)
MCH: 29.4 pg (ref 26.0–34.0)
MCHC: 32.3 g/dL (ref 30.0–36.0)
MCV: 91 fL (ref 78.0–100.0)
RBC: 3.33 MIL/uL — ABNORMAL LOW (ref 3.87–5.11)

## 2012-01-09 LAB — CARDIAC PANEL(CRET KIN+CKTOT+MB+TROPI)
CK, MB: 2 ng/mL (ref 0.3–4.0)
Total CK: 55 U/L (ref 7–177)
Troponin I: <0.3 ng/mL (ref <0.30)

## 2012-01-09 MED ORDER — SERTRALINE HCL 50 MG PO TABS
50.0000 mg | ORAL_TABLET | Freq: Every day | ORAL | Status: DC
  Administered 2012-01-09: 50 mg via ORAL
  Filled 2012-01-09 (×2): qty 1

## 2012-01-09 MED ORDER — POTASSIUM CHLORIDE CRYS ER 20 MEQ PO TBCR
40.0000 meq | EXTENDED_RELEASE_TABLET | Freq: Once | ORAL | Status: AC
  Administered 2012-01-09: 40 meq via ORAL
  Filled 2012-01-09: qty 2

## 2012-01-09 MED ORDER — FLUTICASONE PROPIONATE 50 MCG/ACT NA SUSP
1.0000 | Freq: Every day | NASAL | Status: DC
  Administered 2012-01-09 – 2012-01-10 (×2): 1 via NASAL
  Filled 2012-01-09: qty 16

## 2012-01-09 MED ORDER — ALLOPURINOL 300 MG PO TABS
300.0000 mg | ORAL_TABLET | Freq: Every day | ORAL | Status: DC
  Administered 2012-01-09 – 2012-01-12 (×3): 300 mg via ORAL
  Filled 2012-01-09 (×4): qty 1

## 2012-01-09 NOTE — Progress Notes (Signed)
  Echocardiogram 2D Echocardiogram has been performed.  Cathie Beams Deneen 01/09/2012, 3:13 PM

## 2012-01-09 NOTE — Progress Notes (Signed)
Subjective: Patient denies chest pain, has mild frontal headache for months now, she has some worsening of her sinus congestion.  She has not been eating well, feels depressed , because she is not anymore independent and is retired. She denies currently suicidal thought. She has had some dizziness for a while worse yesterday.   Objective: Filed Vitals:   01/09/12 0300 01/09/12 0500 01/09/12 0503 01/09/12 0506  BP: 113/37 109/44 129/50 128/54  Pulse: 43 43 51 49  Temp: 97.8 F (36.6 C) 97.8 F (36.6 C)    TempSrc:      Resp: 16 16    Height:      Weight:      SpO2: 98% 98%     Weight change:   Intake/Output Summary (Last 24 hours) at 01/09/12 1040 Last data filed at 01/09/12 0918  Gross per 24 hour  Intake      3 ml  Output      0 ml  Net      3 ml    General: Alert, awake, oriented x3, in no acute distress.  HEENT: No bruits, no goiter.  Heart: Regular rate and rhythm, without murmurs, rubs, gallops.  Lungs: CTA, bilateral air movement.  Abdomen: Soft, nontender, nondistended, positive bowel sounds.  Neuro: Grossly intact, nonfocal. Extremities; no edema.   Lab Results:  [facility] 01/09/12 0514 01/08/12 1714  NA 140 142  K 3.6 4.4  CL 106 107  CO2 25 --  GLUCOSE 78 121*  BUN 22 22  CREATININE 1.06 1.10  CALCIUM 8.9 --  MG -- --  PHOS -- --   Basename 01/09/12 0514 01/08/12 1714 01/08/12 1659  WBC 4.8 -- 6.7  NEUTROABS -- -- 3.7  HGB 9.8* 12.2 --  HCT 30.3* 36.0 --  MCV 91.0 -- 90.7  PLT 120* -- 149*    Basename 01/09/12 0514 01/08/12 2037  CKTOTAL 55 71  CKMB 2.0 2.0  CKMBINDEX -- --  TROPONINI <0.30 <0.30    Basename 01/09/12 0514  CHOL 168  HDL 34*  LDLCALC 107*  TRIG 137  CHOLHDL 4.9  LDLDIRECT --   Micro Results: No results found for this or any previous visit (from the past 240 hour(s)).  Studies/Results: Dg Chest 2 View  01/08/2012  *RADIOLOGY REPORT*  Clinical Data: Chest pain.  CHEST - 2 VIEW  Comparison: 06/11/2005  Findings:  Cardiomegaly. There is hyperinflation of the lungs compatible with COPD.  Diffuse peribronchial thickening again noted, likely chronic bronchitis.  No acute opacities or effusions. Degenerative changes in the thoracic spine and shoulders.  IMPRESSION: COPD/chronic bronchitis.  Cardiomegaly.  No active disease.  Original Report Authenticated By: Cyndie Chime, M.D.   Ct Head Wo Contrast  01/08/2012  *RADIOLOGY REPORT*  Clinical Data: Headache.  CT HEAD WITHOUT CONTRAST  Technique:  Contiguous axial images were obtained from the base of the skull through the vertex without contrast.  Comparison: 08/14/2009  Findings: Mild cerebral atrophy.  Physiologic calcifications in the basal ganglia, stable. No acute intracranial abnormality. Specifically, no hemorrhage, hydrocephalus, mass lesion, acute infarction, or significant intracranial injury.  No acute calvarial abnormality. Visualized paranasal sinuses and mastoids clear. Orbital soft tissues unremarkable.  IMPRESSION: No acute intracranial abnormality.  Original Report Authenticated By: Cyndie Chime, M.D.    Medications: I have reviewed the patient's current medications.   Patient Active Hospital Problem List:  Chest pain; SOB Per admission note EKG with T wave inversion unclear if old. Cardiac enzymes times 2 negative. No more  chest pain.  I will check ECHO. Had cath for same complaints in 2005 that show only 30 % occlusion of proximal RCA. No pulomonary edema. No SOB.  Headaches:  CT head negative. Probably related to sinus headaches. If no resolution of headaches, she might benefit of MRI. Patient aware.  Hypertension (01/08/2012) Hold BP medications in setting of dizziness and orthostatic.  Diabetes mellitus (01/08/2012) CBG in the 70 and 90. Hold Glyburide. Hold metformin.  Bradycardia: Check TSH, continue to cycle cardiac enzymes. ECHO.  Orthostatic, Dizzeness: Continue with IV fluids.  PT, OT consult tomorrow.  Depression:  Continue with  Zoloft. Patient advised to follow up with PCP and psychiatrist son aware also.    LOS: 1 day   Jasten Guyette M.D.  [facility] 01/09/2012, 10:40 AM

## 2012-01-10 ENCOUNTER — Encounter (HOSPITAL_COMMUNITY)
Admission: EM | Disposition: A | Discharge status: Home / Self Care | Payer: Self-pay | Source: Home / Self Care | Attending: Internal Medicine

## 2012-01-10 ENCOUNTER — Inpatient Hospital Stay (HOSPITAL_COMMUNITY): Payer: Medicare Other

## 2012-01-10 DIAGNOSIS — R5383 Other fatigue: Secondary | ICD-10-CM | POA: Diagnosis present

## 2012-01-10 DIAGNOSIS — I350 Nonrheumatic aortic (valve) stenosis: Secondary | ICD-10-CM | POA: Diagnosis present

## 2012-01-10 DIAGNOSIS — R001 Bradycardia, unspecified: Secondary | ICD-10-CM | POA: Diagnosis present

## 2012-01-10 DIAGNOSIS — D649 Anemia, unspecified: Secondary | ICD-10-CM | POA: Diagnosis present

## 2012-01-10 DIAGNOSIS — I2510 Atherosclerotic heart disease of native coronary artery without angina pectoris: Secondary | ICD-10-CM | POA: Diagnosis present

## 2012-01-10 DIAGNOSIS — IMO0001 Reserved for inherently not codable concepts without codable children: Secondary | ICD-10-CM | POA: Diagnosis present

## 2012-01-10 DIAGNOSIS — E785 Hyperlipidemia, unspecified: Secondary | ICD-10-CM | POA: Diagnosis present

## 2012-01-10 HISTORY — PX: LEFT HEART CATHETERIZATION WITH CORONARY ANGIOGRAM: SHX5451

## 2012-01-10 LAB — GLUCOSE, CAPILLARY
Glucose-Capillary: 91 mg/dL (ref 70–99)
Glucose-Capillary: 125 mg/dL — ABNORMAL HIGH (ref 70–99)
Glucose-Capillary: 90 mg/dL (ref 70–99)
Glucose-Capillary: 103 mg/dL — ABNORMAL HIGH (ref 70–99)
Glucose-Capillary: 137 mg/dL — ABNORMAL HIGH (ref 70–99)

## 2012-01-10 LAB — BASIC METABOLIC PANEL
BUN: 21 mg/dL (ref 6–23)
Chloride: 109 mEq/L (ref 96–112)
Glucose, Bld: 109 mg/dL — ABNORMAL HIGH (ref 70–99)
Potassium: 4.4 mEq/L (ref 3.5–5.1)

## 2012-01-10 LAB — CBC
HCT: 30.6 % — ABNORMAL LOW (ref 36.0–46.0)
HCT: 33 % — ABNORMAL LOW (ref 36.0–46.0)
Hemoglobin: 9.7 g/dL — ABNORMAL LOW (ref 12.0–15.0)
Hemoglobin: 10.7 g/dL — ABNORMAL LOW (ref 12.0–15.0)
MCH: 28.9 pg (ref 26.0–34.0)
MCHC: 32.4 g/dL (ref 30.0–36.0)
RBC: 3.39 MIL/uL — ABNORMAL LOW (ref 3.87–5.11)
RDW: 15 % (ref 11.5–15.5)
WBC: 5.2 10*3/uL (ref 4.0–10.5)

## 2012-01-10 SURGERY — LEFT HEART CATHETERIZATION WITH CORONARY ANGIOGRAM
Anesthesia: LOCAL

## 2012-01-10 MED ORDER — LISINOPRIL 40 MG PO TABS
40.0000 mg | ORAL_TABLET | Freq: Every day | ORAL | Status: DC
  Administered 2012-01-10 – 2012-01-12 (×2): 40 mg via ORAL
  Filled 2012-01-10 (×3): qty 1

## 2012-01-10 MED ORDER — SODIUM CHLORIDE 0.9 % IJ SOLN: 3.0000 mL | INTRAMUSCULAR | Status: DC | PRN

## 2012-01-10 MED ORDER — CEFAZOLIN SODIUM-DEXTROSE 2-3 GM-% IV SOLR
2.0000 g | INTRAVENOUS | Status: DC
  Filled 2012-01-10 (×2): qty 50

## 2012-01-10 MED ORDER — DIAZEPAM 5 MG PO TABS
5.0000 mg | ORAL_TABLET | ORAL | Status: AC
  Administered 2012-01-11: 5 mg via ORAL
  Filled 2012-01-10: qty 1

## 2012-01-10 MED ORDER — SODIUM CHLORIDE 0.9 % IV SOLN
INTRAVENOUS | Status: DC
  Administered 2012-01-10: 14:00:00 via INTRAVENOUS

## 2012-01-10 MED ORDER — ENOXAPARIN SODIUM 40 MG/0.4ML ~~LOC~~ SOLN
40.0000 mg | SUBCUTANEOUS | Status: DC
  Filled 2012-01-10: qty 0.4

## 2012-01-10 MED ORDER — DIAZEPAM 5 MG PO TABS
5.0000 mg | ORAL_TABLET | ORAL | Status: AC
  Administered 2012-01-10: 5 mg via ORAL
  Filled 2012-01-10: qty 1

## 2012-01-10 MED ORDER — FENTANYL CITRATE 0.05 MG/ML IJ SOLN
INTRAMUSCULAR | Status: AC
  Filled 2012-01-10: qty 2

## 2012-01-10 MED ORDER — ACETAMINOPHEN 325 MG PO TABS: 650.0000 mg | ORAL_TABLET | ORAL | Status: DC | PRN

## 2012-01-10 MED ORDER — SODIUM CHLORIDE 0.9 % IV SOLN
1.0000 mL/kg/h | INTRAVENOUS | Status: AC
  Administered 2012-01-10: 1 mL/kg/h via INTRAVENOUS

## 2012-01-10 MED ORDER — LIDOCAINE HCL (PF) 1 % IJ SOLN
INTRAMUSCULAR | Status: AC
  Filled 2012-01-10: qty 30

## 2012-01-10 MED ORDER — HYDRALAZINE HCL 20 MG/ML IJ SOLN
INTRAMUSCULAR | Status: AC
  Filled 2012-01-10: qty 1

## 2012-01-10 MED ORDER — SODIUM CHLORIDE 0.45 % IV SOLN: INTRAVENOUS | Status: DC

## 2012-01-10 MED ORDER — NITROGLYCERIN 0.2 MG/ML ON CALL CATH LAB
INTRAVENOUS | Status: AC
  Filled 2012-01-10: qty 1

## 2012-01-10 MED ORDER — HEPARIN (PORCINE) IN NACL 2-0.9 UNIT/ML-% IJ SOLN
INTRAMUSCULAR | Status: AC
  Filled 2012-01-10: qty 2000

## 2012-01-10 MED ORDER — ATORVASTATIN CALCIUM 40 MG PO TABS
40.0000 mg | ORAL_TABLET | Freq: Every day | ORAL | Status: DC
  Administered 2012-01-11: 40 mg via ORAL
  Filled 2012-01-10 (×4): qty 1

## 2012-01-10 MED ORDER — ONDANSETRON HCL 4 MG/2ML IJ SOLN: 4.0000 mg | Freq: Four times a day (QID) | INTRAMUSCULAR | Status: DC | PRN

## 2012-01-10 MED ORDER — MIDAZOLAM HCL 2 MG/2ML IJ SOLN
INTRAMUSCULAR | Status: AC
  Filled 2012-01-10: qty 2

## 2012-01-10 MED ORDER — SODIUM CHLORIDE 0.9 % IR SOLN
80.0000 mg | Status: DC
  Filled 2012-01-10 (×2): qty 2

## 2012-01-10 MED ORDER — SODIUM CHLORIDE 0.9 % IV SOLN
INTRAVENOUS | Status: DC
  Administered 2012-01-11: 07:00:00 via INTRAVENOUS

## 2012-01-10 MED ORDER — SODIUM CHLORIDE 0.9 % IV SOLN: 250.0000 mL | INTRAVENOUS | Status: DC | PRN

## 2012-01-10 MED ORDER — HYDRALAZINE HCL 25 MG PO TABS
25.0000 mg | ORAL_TABLET | Freq: Three times a day (TID) | ORAL | Status: DC | PRN
  Filled 2012-01-10: qty 1

## 2012-01-10 NOTE — Consult Note (Addendum)
Pt. Seen and examined. Agree with the NP/PA-C note as written.  78 yo female with progressive fatigue, dyspnea and chest pain. Pain is somewhat atypical, however, she has a history of prior 30% stenosis to the RCA.  She is also in marked sinus bradycardia (LAFB and NS IVCD) in the mid-30's, suggesting infra-Hissian disease. BP is stable and she is currently tolerating it. Discussed options with the patient and feel that she needs an ischemia evaluation for progression of RCA stenosis. If there is no significant obstructive CAD, then we could plan a permanent pacemaker tomorrow.  She is agreeable to this approach.  Chrystie Nose, MD, [facility] Attending Cardiologist [facility]

## 2012-01-10 NOTE — Consult Note (Signed)
Reason for Consult: Chest pain  Requesting Physician: [facility]  HPI: This is a 78 y.o. female with a past medical history significant for prior cardiac cath 2005 which showed 30% RCA. She was admitted this weekend with complaints of Rt sided chest pain that radiated down her Rt arm Her symptoms were atypical being non-exertion, sharp in nature, and brief in duration. She was noted to have an "abnormal EKG" in the ER and was admitted. Her troponin is negative X2. On telemetry she has had significant bradycardia with rates in the 30s. She is tolerating this well. She does admit to some generalized fatigue but denies any tachycardia or syncope.  PMHx:  Past Medical History  Diagnosis Date  . Diabetes mellitus   . Hypertension   . Gout   . Osteoarthritis   . Bursitis    Past Surgical History  Procedure Date  . Extremity cyst excision     FAMHx: Family History  Problem Relation Age of Onset  . Breast cancer Mother   . Diabetes Daughter     SOCHx:  reports that she has never smoked. She does not have any smokeless tobacco history on file. She reports that she does not drink alcohol or use illicit drugs.  ALLERGIES: Allergies  Allergen Reactions  . Fluzone (Flu Virus Vaccine) Other (See Comments)    Feeling unwell for a week. Cold symptoms/flu symtoms    ROS: Pertinent items are noted in HPI. Positive for symptoms of carpal tunnel  HOME MEDICATIONS: Prescriptions prior to admission  Medication Sig Dispense Refill  . allopurinol (ZYLOPRIM) 300 MG tablet Take 300 mg by mouth daily.      . furosemide (LASIX) 20 MG tablet Take 20 mg by mouth daily.      Marland Kitchen glyBURIDE (DIABETA) 5 MG tablet Take 10 mg by mouth daily with breakfast.      . lisinopril (PRINIVIL,ZESTRIL) 40 MG tablet Take 40 mg by mouth daily.      . metFORMIN (GLUCOPHAGE) 500 MG tablet Take 500 mg by mouth 2 (two) times daily with a meal.      . sertraline (ZOLOFT) 50 MG tablet Take 50 mg by mouth daily.         HOSPITAL MEDICATIONS: I have reviewed the patient's current medications.  VITALS: Blood pressure 148/60, pulse 45, temperature 97.6 F (36.4 C), temperature source Oral, resp. rate 18, height 5\' 4"  (1.626 m), weight 74.8 kg (164 lb 14.5 oz), SpO2 96.00%.  PHYSICAL EXAM: General appearance: alert, cooperative and no distress Neck: no JVD, supple, symmetrical, trachea midline and soft transmitted murmur to Lt carotid Lungs: clear to auscultation bilaterally Heart: regular rate and rhythm and 2/6 systolic murmur LSB Abdomen: soft, non-tender; bowel sounds normal; no masses,  no organomegaly Extremities: extremities normal, atraumatic, no cyanosis or edema Pulses: 2+ and symmetric Skin: Skin color, texture, turgor normal. No rashes or lesions Neurologic: Grossly normal  LABS: Results for orders placed during the hospital encounter of 01/08/12 (from the past 48 hour(s))  GLUCOSE, CAPILLARY     Status: Abnormal   Collection Time   01/08/12  4:10 PM      Component Value Range Comment   Glucose-Capillary 56 (*) 70 - 99 (mg/dL)    Comment 1 Notify RN     GLUCOSE, CAPILLARY     Status: Normal   Collection Time   01/08/12  4:30 PM      Component Value Range Comment   Glucose-Capillary 70  70 - 99 (mg/dL)  CBC     Status: Abnormal   Collection Time   01/08/12  4:59 PM      Component Value Range Comment   WBC 6.7  4.0 - 10.5 (K/uL)    RBC 4.21  3.87 - 5.11 (MIL/uL)    Hemoglobin 12.3  12.0 - 15.0 (g/dL)    HCT 16.1  09.6 - 04.5 (%)    MCV 90.7  78.0 - 100.0 (fL)    MCH 29.2  26.0 - 34.0 (pg)    MCHC 32.2  30.0 - 36.0 (g/dL)    RDW 40.9  81.1 - 91.4 (%)    Platelets 149 (*) 150 - 400 (K/uL)   DIFFERENTIAL     Status: Normal   Collection Time   01/08/12  4:59 PM      Component Value Range Comment   Neutrophils Relative 55  43 - 77 (%)    Neutro Abs 3.7  1.7 - 7.7 (K/uL)    Lymphocytes Relative 36  12 - 46 (%)    Lymphs Abs 2.4  0.7 - 4.0 (K/uL)    Monocytes Relative 7  3 -  12 (%)    Monocytes Absolute 0.4  0.1 - 1.0 (K/uL)    Eosinophils Relative 3  0 - 5 (%)    Eosinophils Absolute 0.2  0.0 - 0.7 (K/uL)    Basophils Relative 0  0 - 1 (%)    Basophils Absolute 0.0  0.0 - 0.1 (K/uL)   GLUCOSE, CAPILLARY     Status: Abnormal   Collection Time   01/08/12  5:05 PM      Component Value Range Comment   Glucose-Capillary 126 (*) 70 - 99 (mg/dL)    Comment 1 Documented in Chart      Comment 2 Notify RN     POCT I-STAT TROPONIN I     Status: Normal   Collection Time   01/08/12  5:12 PM      Component Value Range Comment   Troponin i, poc 0.00  0.00 - 0.08 (ng/mL)    Comment 3            POCT I-STAT, CHEM 8     Status: Abnormal   Collection Time   01/08/12  5:14 PM      Component Value Range Comment   Sodium 142  135 - 145 (mEq/L)    Potassium 4.4  3.5 - 5.1 (mEq/L)    Chloride 107  96 - 112 (mEq/L)    BUN 22  6 - 23 (mg/dL)    Creatinine, Ser 7.82  0.50 - 1.10 (mg/dL)    Glucose, Bld 956 (*) 70 - 99 (mg/dL)    Calcium, Ion 2.13  1.12 - 1.32 (mmol/L)    TCO2 24  0 - 100 (mmol/L)    Hemoglobin 12.2  12.0 - 15.0 (g/dL)    HCT 08.6  57.8 - 46.9 (%)   CARDIAC PANEL(CRET KIN+CKTOT+MB+TROPI)     Status: Normal   Collection Time   01/08/12  8:37 PM      Component Value Range Comment   Total CK 71  7 - 177 (U/L)    CK, MB 2.0  0.3 - 4.0 (ng/mL)    Troponin I <0.30  <0.30 (ng/mL)    Relative Index RELATIVE INDEX IS INVALID  0.0 - 2.5    GLUCOSE, CAPILLARY     Status: Normal   Collection Time   01/08/12  9:04 PM      Component Value  Range Comment   Glucose-Capillary 75  70 - 99 (mg/dL)   HEMOGLOBIN W0J     Status: Abnormal   Collection Time   01/08/12  9:13 PM      Component Value Range Comment   Hemoglobin A1C 6.3 (*) <5.7 (%)    Mean Plasma Glucose 134 (*) <117 (mg/dL)   CARDIAC PANEL(CRET KIN+CKTOT+MB+TROPI)     Status: Normal   Collection Time   01/09/12  5:14 AM      Component Value Range Comment   Total CK 55  7 - 177 (U/L)    CK, MB 2.0  0.3 - 4.0  (ng/mL)    Troponin I <0.30  <0.30 (ng/mL)    Relative Index RELATIVE INDEX IS INVALID  0.0 - 2.5    BASIC METABOLIC PANEL     Status: Abnormal   Collection Time   01/09/12  5:14 AM      Component Value Range Comment   Sodium 140  135 - 145 (mEq/L)    Potassium 3.6  3.5 - 5.1 (mEq/L)    Chloride 106  96 - 112 (mEq/L)    CO2 25  19 - 32 (mEq/L)    Glucose, Bld 78  70 - 99 (mg/dL)    BUN 22  6 - 23 (mg/dL)    Creatinine, Ser 8.11  0.50 - 1.10 (mg/dL)    Calcium 8.9  8.4 - 10.5 (mg/dL)    GFR calc non Af Amer 49 (*) >90 (mL/min)    GFR calc Af Amer 57 (*) >90 (mL/min)   CBC     Status: Abnormal   Collection Time   01/09/12  5:14 AM      Component Value Range Comment   WBC 4.8  4.0 - 10.5 (K/uL)    RBC 3.33 (*) 3.87 - 5.11 (MIL/uL)    Hemoglobin 9.8 (*) 12.0 - 15.0 (g/dL) DELTA CHECK NOTED   HCT 30.3 (*) 36.0 - 46.0 (%)    MCV 91.0  78.0 - 100.0 (fL)    MCH 29.4  26.0 - 34.0 (pg)    MCHC 32.3  30.0 - 36.0 (g/dL)    RDW 91.4  78.2 - 95.6 (%)    Platelets 120 (*) 150 - 400 (K/uL)   LIPID PANEL     Status: Abnormal   Collection Time   01/09/12  5:14 AM      Component Value Range Comment   Cholesterol 168  0 - 200 (mg/dL)    Triglycerides 213  <150 (mg/dL)    HDL 34 (*) >08 (mg/dL)    Total CHOL/HDL Ratio 4.9      VLDL 27  0 - 40 (mg/dL)    LDL Cholesterol 657 (*) 0 - 99 (mg/dL)   GLUCOSE, CAPILLARY     Status: Normal   Collection Time   01/09/12  8:09 AM      Component Value Range Comment   Glucose-Capillary 94  70 - 99 (mg/dL)   GLUCOSE, CAPILLARY     Status: Abnormal   Collection Time   01/09/12 11:46 AM      Component Value Range Comment   Glucose-Capillary 116 (*) 70 - 99 (mg/dL)   CARDIAC PANEL(CRET KIN+CKTOT+MB+TROPI)     Status: Normal   Collection Time   01/09/12 11:56 AM      Component Value Range Comment   Total CK 62  7 - 177 (U/L)    CK, MB 2.2  0.3 - 4.0 (ng/mL)    Troponin I <  0.30  <0.30 (ng/mL)    Relative Index RELATIVE INDEX IS INVALID  0.0 - 2.5    TSH      Status: Normal   Collection Time   01/09/12 11:56 AM      Component Value Range Comment   TSH 1.994  0.350 - 4.500 (uIU/mL)   GLUCOSE, CAPILLARY     Status: Abnormal   Collection Time   01/09/12  4:58 PM      Component Value Range Comment   Glucose-Capillary 125 (*) 70 - 99 (mg/dL)   GLUCOSE, CAPILLARY     Status: Abnormal   Collection Time   01/09/12  9:10 PM      Component Value Range Comment   Glucose-Capillary 157 (*) 70 - 99 (mg/dL)   CBC     Status: Abnormal   Collection Time   01/10/12  5:30 AM      Component Value Range Comment   WBC 5.2  4.0 - 10.5 (K/uL)    RBC 3.39 (*) 3.87 - 5.11 (MIL/uL)    Hemoglobin 9.7 (*) 12.0 - 15.0 (g/dL)    HCT 16.1 (*) 09.6 - 46.0 (%)    MCV 90.3  78.0 - 100.0 (fL)    MCH 28.6  26.0 - 34.0 (pg)    MCHC 31.7  30.0 - 36.0 (g/dL)    RDW 04.5  40.9 - 81.1 (%)    Platelets 121 (*) 150 - 400 (K/uL)   BASIC METABOLIC PANEL     Status: Abnormal   Collection Time   01/10/12  5:30 AM      Component Value Range Comment   Sodium 140  135 - 145 (mEq/L)    Potassium 4.4  3.5 - 5.1 (mEq/L)    Chloride 109  96 - 112 (mEq/L)    CO2 23  19 - 32 (mEq/L)    Glucose, Bld 109 (*) 70 - 99 (mg/dL)    BUN 21  6 - 23 (mg/dL)    Creatinine, Ser 9.14  0.50 - 1.10 (mg/dL)    Calcium 8.9  8.4 - 10.5 (mg/dL)    GFR calc non Af Amer 55 (*) >90 (mL/min)    GFR calc Af Amer 64 (*) >90 (mL/min)   GLUCOSE, CAPILLARY     Status: Normal   Collection Time   01/10/12  7:44 AM      Component Value Range Comment   Glucose-Capillary 91  70 - 99 (mg/dL)     IMAGING: Dg Chest 2 View  01/08/2012  *RADIOLOGY REPORT*  Clinical Data: Chest pain.  CHEST - 2 VIEW  Comparison: 06/11/2005  Findings: Cardiomegaly. There is hyperinflation of the lungs compatible with COPD.  Diffuse peribronchial thickening again noted, likely chronic bronchitis.  No acute opacities or effusions. Degenerative changes in the thoracic spine and shoulders.  IMPRESSION: COPD/chronic bronchitis.  Cardiomegaly.   No active disease.  Original Report Authenticated By: Cyndie Chime, M.D.   Ct Head Wo Contrast  01/08/2012  *RADIOLOGY REPORT*  Clinical Data: Headache.  CT HEAD WITHOUT CONTRAST  Technique:  Contiguous axial images were obtained from the base of the skull through the vertex without contrast.  Comparison: 08/14/2009  Findings: Mild cerebral atrophy.  Physiologic calcifications in the basal ganglia, stable. No acute intracranial abnormality. Specifically, no hemorrhage, hydrocephalus, mass lesion, acute infarction, or significant intracranial injury.  No acute calvarial abnormality. Visualized paranasal sinuses and mastoids clear. Orbital soft tissues unremarkable.  IMPRESSION: No acute intracranial abnormality.  Original Report Authenticated By: Caryn Bee  G. Kearney Hard, M.D.    IMPRESSION:  Principal Problem:  *Chest pain at rest, (atypical)  Active Problems:  Sinus bradycardia, HR as low As 35  SOB (shortness of breath)  COPD bronchitis by CXR  CAD, mild, 30% RCA in 2005  Fatigue  Hypertension  Diabetes mellitus  Dyslipidemia  Aortic stenosis, NL LVF, 2D 01/09/12  Anemia, Hgb 9.7   RECOMMENDATION: MD to see. She needs ischemia evaluation (? Cath vs Myoview)  and possibly a pacemaker as it appears she was not on any chronotropic agents prior to admission. Will add statin with history of DM and CAD. Check stool guaiac and follow up CBC.  Time Spent Directly with Patient: 45 minutes  Marcia Lepera K 01/10/2012, 11:03 AM

## 2012-01-10 NOTE — Evaluation (Signed)
Occupational Therapy Evaluation and Discharge Patient Details Name: Danielle Benitez MRN: [id number] DOB: [birth year] Today's Date: 01/10/2012  Problem List:  Patient Active Problem List  Diagnoses  . Chest pain at rest, (atypical)  . SOB (shortness of breath)  . Hypertension  . Diabetes mellitus  . COPD bronchitis by CXR  . Dyslipidemia  . CAD, mild, 30% RCA in 2005  . Fatigue  . Sinus bradycardia, HR as low As 35  . Aortic stenosis, NL LVF, 2D 01/09/12  . Anemia, hgb 9.7    Past Medical History:  Past Medical History  Diagnosis Date  . Diabetes mellitus   . Hypertension   . Gout   . Osteoarthritis   . Bursitis    Past Surgical History:  Past Surgical History  Procedure Date  . Extremity cyst excision     OT Assessment/Plan/Recommendation OT Assessment Clinical Impression Statement: This 78 yo female at a Supervison level for BADLs and will have intermittent S at home. All DME recommendations discussed with pt and daughter ( tub seat, hand held shower head, 4 wheeled walker). No further OT needs, will sign off. OT Recommendation/Assessment: Patient does not need any further OT services OT Recommendation Follow Up Recommendations: No OT follow up Equipment Recommended:  (discussed with pt and daughter) OT Goals    OT Evaluation Precautions/Restrictions  Precautions Precautions: Fall Precaution Comments: Has a h/o but does not currently want to use an AD Required Braces or Orthoses: No Restrictions Weight Bearing Restrictions: No Prior Functioning Home Living Lives With: Daughter;Family Receives Help From: Family Type of Home: Apartment Home Layout: One level Home Access: Level entry Bathroom Shower/Tub: Tub/shower unit;Curtain Firefighter: Standard Bathroom Accessibility: Yes How Accessible: Accessible via walker Home Adaptive Equipment: Straight cane Prior Function Level of Independence: Independent with basic ADLs;Independent with homemaking with  ambulation;Independent with gait;Independent with transfers Able to Take Stairs?: Yes Driving: Yes Vocation: Retired ADL ADL Eating/Feeding: Simulated;Independent Where Assessed - Eating/Feeding: Edge of bed;Chair Grooming: Performed;Supervision/safety Where Assessed - Grooming: Unsupported;Standing at sink Upper Body Bathing: Simulated;Supervision/safety Where Assessed - Upper Body Bathing: Unsupported;Sitting, chair;Sitting, bed Lower Body Bathing: Simulated;Supervision/safety Where Assessed - Lower Body Bathing: Unsupported;Sit to stand from chair;Sit to stand from bed Upper Body Dressing: Simulated;Supervision/safety Where Assessed - Upper Body Dressing: Unsupported;Sitting, chair;Sitting, bed Lower Body Dressing: Simulated;Supervision/safety Where Assessed - Lower Body Dressing: Unsupported;Sit to stand from chair;Sit to stand from bed Toilet Transfer: Performed;Supervision/safety Toilet Transfer Method: Proofreader: Comfort height toilet;Grab bars Toileting - Clothing Manipulation: Independent;Performed Where Assessed - Glass blower/designer Manipulation: Standing Toileting - Hygiene: Performed;Independent Where Assessed - Toileting Hygiene: Sit to stand from 3-in-1 or toilet Tub/Shower Transfer: Not assessed (Did recommend a tub seat) Tub/Shower Transfer Method: Not assessed Ambulation Related to ADLs: Supervision Vision/Perception    Cognition Cognition Arousal/Alertness: Awake/alert Overall Cognitive Status: Appears within functional limits for tasks assessed Sensation/Coordination   Extremity Assessment RUE Assessment RUE Assessment:  (grossly 3/5) LUE Assessment LUE Assessment:  (grossly 3/5) Mobility  Bed Mobility Bed Mobility: No Transfers Transfers: Yes Sit to Stand: 6: Modified independent (Device/Increase time);With upper extremity assist;From bed (increased time) Stand to Sit: 6: Modified independent (Device/Increase time);With  upper extremity assist;To bed (increased time) Exercises   End of Session OT - End of Session Equipment Utilized During Treatment:  (None) Activity Tolerance: Patient tolerated treatment well Patient left: in bed;with call bell in reach;with family/visitor present (sitting EOB) General Behavior During Session: [facility] for tasks performed Cognition: [facility] for tasks performed  Co-eval with PT  Evette Georges [phone] 01/10/2012, 11:06 AM

## 2012-01-10 NOTE — Significant Event (Signed)
Rapid Response Event Note Called to evaluate new groin site hematoma s/p cath Overview: Time Called: 2044 Arrival Time: 2045 Event Type: Other (Comment) (hematoma)  Initial Focused Assessment: Patient alert and oriented, complaining of pain in her vagina and rectum, hematoma noted on R groin site, reported by bedside RN that initial Mariana Kaufman score was 0 at shift change , BP stable, HR-40-50 (patient's baseline)  Interventions: Manual pressure applied while sandbag was obtained, Dilaudid given to help pain, orders received for stat CBC and to transfer patient to step down for further observation  Event Summary: Name of Physician Notified: Dr. Allyson Sabal at 2045    at    Outcome: Transferred (Comment) (tx [id number])  Event End Time: 2115  Burna Forts Isidoro Donning

## 2012-01-10 NOTE — Progress Notes (Signed)
Physical Therapy Evaluation Patient Details Name: Danielle Benitez MRN: [id number] DOB: [birth year] Today's Date: 01/10/2012  Problem List:  Patient Active Problem List  Diagnoses  . Chest pain at rest, (atypical)  . SOB (shortness of breath)  . Hypertension  . Diabetes mellitus  . COPD bronchitis by CXR  . Dyslipidemia  . Abnormal EKG  . CAD, mild, 30% RCA in 2005    Past Medical History:  Past Medical History  Diagnosis Date  . Diabetes mellitus   . Hypertension   . Gout   . Osteoarthritis   . Bursitis    Past Surgical History:  Past Surgical History  Procedure Date  . Extremity cyst excision     PT Assessment/Plan/Recommendation PT Assessment Clinical Impression Statement: pt presents with Chest Pain and ? Orthostatic.  pt's BP actually increased with standing and pt without c/o dizziness or lightheadedness.  Discussion with pt and Dtr about pt beginning to use cane at home or consider 4WW for safety, as pt notes she hold furniture and sometimes feels like she is going to fall.  Feel pt would benefit from HHPT for Home Safety eval and f/u on AD use.   PT Recommendation/Assessment: Patient will need skilled PT in the acute care venue PT Problem List: Decreased strength;Decreased activity tolerance;Decreased balance;Decreased mobility;Decreased knowledge of use of DME;Decreased knowledge of precautions;Pain Barriers to Discharge: None PT Therapy Diagnosis : Difficulty walking PT Plan PT Frequency: Min 3X/week PT Treatment/Interventions: DME instruction;Gait training;Functional mobility training;Therapeutic activities;Therapeutic exercise;Balance training;Patient/family education PT Recommendation Follow Up Recommendations: Home health PT Equipment Recommended:  (2WU) PT Goals  Acute Rehab PT Goals PT Goal Formulation: With patient/family Time For Goal Achievement: 2 weeks Pt will go Sit to Stand: with modified independence PT Goal: Sit to Stand - Progress: Goal set  today Pt will go Stand to Sit: with modified independence PT Goal: Stand to Sit - Progress: Goal set today Pt will Ambulate: >150 feet;with modified independence;with least restrictive assistive device PT Goal: Ambulate - Progress: Goal set today  PT Evaluation Precautions/Restrictions  Precautions Precautions: Fall Restrictions Weight Bearing Restrictions: No Prior Functioning  Home Living Lives With: Daughter;Family Receives Help From: Family Type of Home: Apartment Home Layout: One level Home Access: Level entry Bathroom Shower/Tub: Tub/shower unit;Curtain Firefighter: Standard Bathroom Accessibility: Yes How Accessible: Accessible via walker Home Adaptive Equipment: Straight cane Prior Function Level of Independence: Independent with basic ADLs;Independent with homemaking with ambulation;Independent with gait;Independent with transfers Able to Take Stairs?: Yes Driving: Yes Vocation: Retired Financial risk analyst Overall Cognitive Status: Appears within functional limits for tasks assessed Sensation/Coordination   Extremity Assessment RLE Assessment RLE Assessment: Exceptions to [facility] RLE Strength RLE Overall Strength Comments: pt's AROM WFL, Strength grossly 4/5 and limited by painful knee and ankle.   LLE Assessment LLE Assessment: Exceptions to [facility] LLE Strength LLE Overall Strength Comments: pt's AROM WFL, Strength grossly 4+/5, limited by painful ankle.   Mobility (including Balance) Bed Mobility Bed Mobility: No (pt sitting EOB) Transfers Transfers: Yes Sit to Stand: 6: Modified independent (Device/Increase time);With upper extremity assist;From bed (Needs increased time.  ) Stand to Sit: 6: Modified independent (Device/Increase time);With upper extremity assist;To bed Ambulation/Gait Ambulation/Gait: Yes Ambulation/Gait Assistance: 5: Supervision (Close Supervision) Ambulation/Gait Assistance Details (indicate cue type and reason): pt requires close  supervision.  pt notes she was told before that she should use a cane, but doesn't like it because it makes her feel old.  pt antalgic R knee and notes always like this.  Also  notes Bil ankles give her "trouble".  Per Dtr, pt's gait appears to be at baseline.   Ambulation Distance (Feet): 100 Feet Assistive device: None Gait Pattern: Step-through pattern;Decreased stride length;Decreased stance time - right;Decreased step length - left;Antalgic;Trunk flexed Stairs: No Wheelchair Mobility Wheelchair Mobility: No  Posture/Postural Control Posture/Postural Control: Postural limitations Postural Limitations: Kyphotic and R Lateral lean.   Balance Balance Assessed: No Exercise    End of Session PT - End of Session Equipment Utilized During Treatment: Gait belt Activity Tolerance: Patient tolerated treatment well Patient left: in bed;with call bell in reach;with family/visitor present (Sitting EOB) Nurse Communication: Mobility status for transfers General Behavior During Session: [facility] for tasks performed Cognition: [facility] for tasks performed  Sunny Schlein, [place] [phone] 01/10/2012, 10:39 AM

## 2012-01-10 NOTE — Progress Notes (Addendum)
Subjective: No more chest pain, still with mild lightheaded on standing.  Had headaches yesterday, none today.  Objective: Filed Vitals:   01/09/12 1300 01/09/12 2100 01/10/12 0100 01/10/12 0500  BP: 110/57 158/49 120/42 134/38  Pulse: 42 45 41 38  Temp: 98 F (36.7 C) 98 F (36.7 C) 97.9 F (36.6 C) 97.6 F (36.4 C)  TempSrc:      Resp: 18 20 16 18   Height:      Weight:      SpO2: 95% 97% 98% 99%   Weight change:   Intake/Output Summary (Last 24 hours) at 01/10/12 0811 Last data filed at 01/10/12 0659  Gross per 24 hour  Intake   2408 ml  Output      0 ml  Net   2408 ml    General: Alert, awake, oriented x3, in no acute distress.  HEENT: No bruits, no goiter.  Heart: Regular rate and rhythm, without murmurs, rubs, gallops.  Lungs: CTA, bilateral air movement.  Abdomen: Soft, nontender, nondistended, positive bowel sounds.  Neuro: Grossly intact, nonfocal. Extremities; no edema.   Lab Results:  [facility] 01/10/12 0530 01/09/12 0514  NA 140 140  K 4.4 3.6  CL 109 106  CO2 23 25  GLUCOSE 109* 78  BUN 21 22  CREATININE 0.96 1.06  CALCIUM 8.9 8.9  MG -- --  PHOS -- --    Basename 01/10/12 0530 01/09/12 0514 01/08/12 1659  WBC 5.2 4.8 --  NEUTROABS -- -- 3.7  HGB 9.7* 9.8* --  HCT 30.6* 30.3* --  MCV 90.3 91.0 --  PLT 121* 120* --    Basename 01/09/12 1156 01/09/12 0514 01/08/12 2037  CKTOTAL 62 55 71  CKMB 2.2 2.0 2.0  CKMBINDEX -- -- --  TROPONINI <0.30 <0.30 <0.30    Basename 01/08/12 2113  HGBA1C 6.3*    Basename 01/09/12 0514  CHOL 168  HDL 34*  LDLCALC 107*  TRIG 137  CHOLHDL 4.9  LDLDIRECT --    Basename 01/09/12 1156  TSH 1.994  T4TOTAL --  T3FREE --  THYROIDAB --    Micro Results: No results found for this or any previous visit (from the past 240 hour(s)).  Studies/Results: Dg Chest 2 View  01/08/2012  *RADIOLOGY REPORT*  Clinical Data: Chest pain.  CHEST - 2 VIEW  Comparison: 06/11/2005  Findings: Cardiomegaly. There is  hyperinflation of the lungs compatible with COPD.  Diffuse peribronchial thickening again noted, likely chronic bronchitis.  No acute opacities or effusions. Degenerative changes in the thoracic spine and shoulders.  IMPRESSION: COPD/chronic bronchitis.  Cardiomegaly.  No active disease.  Original Report Authenticated By: Cyndie Chime, M.D.   Ct Head Wo Contrast  01/08/2012  *RADIOLOGY REPORT*  Clinical Data: Headache.  CT HEAD WITHOUT CONTRAST  Technique:  Contiguous axial images were obtained from the base of the skull through the vertex without contrast.  Comparison: 08/14/2009  Findings: Mild cerebral atrophy.  Physiologic calcifications in the basal ganglia, stable. No acute intracranial abnormality. Specifically, no hemorrhage, hydrocephalus, mass lesion, acute infarction, or significant intracranial injury.  No acute calvarial abnormality. Visualized paranasal sinuses and mastoids clear. Orbital soft tissues unremarkable.  IMPRESSION: No acute intracranial abnormality.  Original Report Authenticated By: Cyndie Chime, M.D.    Medications: I have reviewed the patient's current medications.  Chest pain; SOB  Cardiac enzymes times 3 negative. No more chest pain.  ECHO no wall motion abnormalities. Had cath for same complaints in 2005 that show only 30 %  occlusion of proximal RCA. No pulomonary edema. No SOB.  Headaches:  CT head negative. Probably related to sinus headaches ? Marland Kitchen If no resolution of headaches, she might benefit of MRI. Patient aware.  Hypertension (01/08/2012) Hold BP medications in setting of dizziness and orthostatic.  Diabetes mellitus (01/08/2012) CBG in the 70 and 90. Hold Glyburide. Hold metformin. HB-A1c at 6.3. Will consider discharge patient only on metformin.  Bradycardia:  TSH 1.9,. ECHO no wall motion abnormalities, EF 65 to 70 %. Unclear if some of her symptoms are related to bradycardia, lightheaded, dizziness. I will ask her cardiologist for evaluation.     Dizzeness: Continue with IV fluids, asked nurse to repeat orthostatic vital.  PT, OT consult tomorrow.  Depression: Discontinue Zoloft rare cases can cause bradycardic.  Patient advised to follow up with PCP and psychiatrist, son aware also.       LOS: 2 days   Syrita Dovel M.D.  [facility] 01/10/2012, 8:11 AM

## 2012-01-10 NOTE — Progress Notes (Signed)
   CARE MANAGEMENT NOTE 01/10/2012  Patient:  Danielle Benitez, Danielle Benitez   Account Number:  [id number]  Date Initiated:  01/10/2012  Documentation initiated by:  Darlyne Russian  Subjective/Objective Assessment:   Patient admitted with chest pain     Action/Plan:   Patient lives with daughter   Anticipated DC Date:  01/11/2012   Anticipated DC Plan:  HOME/SELF CARE      DC Planning Services  CM consult      Choice offered to / List presented to:             Status of service:  In process, will continue to follow Medicare Important Message given?   (If response is "NO", the following Medicare IM given date fields will be blank) Date Medicare IM given:   Date Additional Medicare IM given:    Discharge Disposition:    Per UR Regulation:    If discussed at Long Length of Stay Meetings, dates discussed:    Comments:  PCP: Dr Lenoria Farrier Neva Seat  01/10/2012  1200 Darlyne Russian RN, [place] [phone]  Met with patient and daughter to discuss discharge planning. She has a straight cane at home and she thinks a rolling walker may help her walking. She does declines any home health care visits. CM to continue to follow for discharge planning needs.

## 2012-01-10 NOTE — Op Note (Signed)
[facility]     CARDIAC CATHETERIZATION REPORT  Danielle Benitez   [id number] [birth year]  Performing Cardiologist: Chrystie Nose Primary Physician: No primary provider on file. Primary Cardiologist:  Dr. Clarene Duke  Procedures Performed:  Left Heart Catheterization via 5 Fr right femoral artery access  Left Ventriculography, (RAO/LAO) 15 ml/sec for 26 ml total contrast  Native Coronary Angiography  Indication(s): Chest pain, symptomatic bradycardia, know mild RCA disease by cath in 2005  History: 78 y.o. female with a history of several months of progressive shortness of breath, fatigue, weakness and right-sided chest pain. She has a history of mild CAD by cardiac cath in 2005 in the RCA. On presentation she was profoundly bradycardic in the 30's and hypertensive. She is referred for cardiac catheterization.  Consent: The procedure with Risks/Benefits/Alternatives and Indications was reviewed with the patient and family.  All questions were answered.    Risks / Complications include, but not limited to: Death, MI, CVA/TIA, VF/VT (with defibrillation), Bradycardia (need for temporary pacer placement), contrast induced nephropathy, bleeding / bruising / hematoma / pseudoaneurysm, vascular or coronary injury (with possible emergent CT or Vascular Surgery), adverse medication reactions, infection.    The patient voices understanding and agree to proceed.    Risks of procedure as well as the alternatives and risks of each were explained to the (patient/caregiver).  Consent for procedure obtained. Consent for signed by MD and patient with RN witness -- placed on chart.  Procedure: The patient was brought to the 2nd Floor [place] [facility] Lab in the fasting state and prepped and draped in the usual sterile fashion for (Right groin) access.  Sterile technique was used including antiseptics, cap, gloves, gown, hand hygiene, mask and sheet.  Skin prep:  Chlorhexidine;  Time Out: Verified patient identification, verified procedure, site/side was marked, verified correct patient position, special equipment/implants available, medications/allergies/relevent history reviewed, required imaging and test results available.  Performed  @DHRADCATH @ @DHGROINCATH @ @DHGRAFTANGIO @  The patient was transported to the cath lab holding area in stable condition.   The patient  was stable before, during and following the procedure.   Patient did tolerate procedure well. There were not complications. EBL: <10 cc  Medications:  Premedication: None  Sedation:  1 mg IV Versed, 25 IV mcg Fentanyl  Contrast:  90  cc Omnipaque   Hemodynamics:  Central Aortic Pressure / Mean Aortic Pressure: 181/62  LV Pressure / LV End diastolic Pressure:  23  Left Ventriculography:  EF:  65%  Wall Motion: Normal. No significant MR.  Coronary Angiographic Data:  Left Main:  Angiographically normal but calcified distally.  Left Anterior Descending (LAD):  Mild luminal irregularities, extends to the apex.   1st diagonal (D1):  No significant stenosis.  Circumflex (LCx):  Large vessel which bifurcates in the mid portion. No significant obstruction.  1st obtuse marginal:  Large marginal branch from bifurcation point. No obstructive disease  Right Coronary Artery: There is a 20-30% proximal stenosis. Mild luminal irregularities. No significant obstruction.  posterior descending artery: Patent without significant obstruction.  posterior lateral branch:  Patent without significant obstruction.  Impression: 1.  No significant obstructive CAD. 2.  Symptomatic bradycardia with HR in the 30's. 3.  LVEF 65%, no WMA's, no regurgitation. 4.  Hypertension with high LVEDP.  Plan: 1.  Plan permanent pacemaker tomorrow. 2.  Blood pressure control with hydralazine overnight. 3.  External pacer at the bedside should her HR decline overnight.  The case and results was  discussed with the patient (and family). The case and results was not discussed with the patient's PCP. The case and results was not discussed with the patient's Cardiologist.  Time Spend Directly with Patient:  45 minutes  Chrystie Nose, MD, [facility] Attending Cardiologist [facility]  Danielle Benitez 01/10/2012, 5:10 PM

## 2012-01-10 NOTE — Progress Notes (Addendum)
Pt called in to room, experiencing severe pain in groin area. Groin a level 0 when initially assessed, but pt has been "fidgety" and wanting to get out of bed. Rt groin reassessed, pt developed a hematoma below groin extending to vagina. Pt complains of pressure in vagina and rectum.  Pressured held to rt groin, MD, and rapid called.   Pressured held and sand bag applied. VS remain stable, HR 45-50 SB. IV dilaudid 0.5mg  given for pain. Orders to move to stepdown.

## 2012-01-10 NOTE — Progress Notes (Signed)
Utilization review complete 

## 2012-01-11 ENCOUNTER — Encounter (HOSPITAL_COMMUNITY): Payer: Self-pay | Admitting: Cardiology

## 2012-01-11 ENCOUNTER — Encounter (HOSPITAL_COMMUNITY)
Admission: EM | Disposition: A | Discharge status: Home / Self Care | Payer: Self-pay | Source: Home / Self Care | Attending: Internal Medicine

## 2012-01-11 DIAGNOSIS — Z950 Presence of cardiac pacemaker: Secondary | ICD-10-CM

## 2012-01-11 HISTORY — DX: Presence of cardiac pacemaker: Z95.0

## 2012-01-11 HISTORY — PX: PERMANENT PACEMAKER INSERTION: SHX5480

## 2012-01-11 LAB — SURGICAL PCR SCREEN
MRSA, PCR: POSITIVE — AB
Staphylococcus aureus: POSITIVE — AB

## 2012-01-11 LAB — CBC
Hemoglobin: 9.9 g/dL — ABNORMAL LOW (ref 12.0–15.0)
MCH: 28.7 pg (ref 26.0–34.0)
MCV: 90.4 fL (ref 78.0–100.0)
Platelets: 125 10*3/uL — ABNORMAL LOW (ref 150–400)
RBC: 3.45 MIL/uL — ABNORMAL LOW (ref 3.87–5.11)
WBC: 8.8 10*3/uL (ref 4.0–10.5)

## 2012-01-11 LAB — GLUCOSE, CAPILLARY
Glucose-Capillary: 135 mg/dL — ABNORMAL HIGH (ref 70–99)
Glucose-Capillary: 79 mg/dL (ref 70–99)
Glucose-Capillary: 209 mg/dL — ABNORMAL HIGH (ref 70–99)

## 2012-01-11 SURGERY — PERMANENT PACEMAKER INSERTION
Anesthesia: LOCAL

## 2012-01-11 SURGERY — PERMANENT PACEMAKER INSERTION
Anesthesia: Choice | Laterality: Left

## 2012-01-11 MED ORDER — ONDANSETRON HCL 4 MG/2ML IJ SOLN: 4.0000 mg | Freq: Four times a day (QID) | INTRAMUSCULAR | Status: DC | PRN

## 2012-01-11 MED ORDER — ALPRAZOLAM 0.25 MG PO TABS: 0.2500 mg | ORAL_TABLET | Freq: Three times a day (TID) | ORAL | Status: DC | PRN

## 2012-01-11 MED ORDER — VANCOMYCIN HCL IN DEXTROSE 1-5 GM/200ML-% IV SOLN
1000.0000 mg | INTRAVENOUS | Status: AC
  Filled 2012-01-11: qty 200

## 2012-01-11 MED ORDER — MIDAZOLAM HCL 2 MG/2ML IJ SOLN
INTRAMUSCULAR | Status: AC
  Filled 2012-01-11: qty 2

## 2012-01-11 MED ORDER — CHLORHEXIDINE GLUCONATE 4 % EX LIQD: Freq: Once | CUTANEOUS | Status: DC

## 2012-01-11 MED ORDER — ZOLPIDEM TARTRATE 5 MG PO TABS
5.0000 mg | ORAL_TABLET | Freq: Every evening | ORAL | Status: DC | PRN
  Administered 2012-01-11: 5 mg via ORAL
  Filled 2012-01-11: qty 1

## 2012-01-11 MED ORDER — LIDOCAINE HCL (PF) 1 % IJ SOLN
INTRAMUSCULAR | Status: AC
  Filled 2012-01-11: qty 60

## 2012-01-11 MED ORDER — MUPIROCIN 2 % EX OINT: 1.0000 "application " | TOPICAL_OINTMENT | Freq: Two times a day (BID) | CUTANEOUS | Status: DC

## 2012-01-11 MED ORDER — CHLORHEXIDINE GLUCONATE CLOTH 2 % EX PADS: 6.0000 | MEDICATED_PAD | Freq: Every day | CUTANEOUS | Status: DC

## 2012-01-11 MED ORDER — CHLORHEXIDINE GLUCONATE 4 % EX LIQD
CUTANEOUS | Status: AC
  Filled 2012-01-11: qty 30

## 2012-01-11 MED ORDER — CHLORHEXIDINE GLUCONATE CLOTH 2 % EX PADS
6.0000 | MEDICATED_PAD | Freq: Every day | CUTANEOUS | Status: DC
  Administered 2012-01-11 – 2012-01-12 (×2): 6 via TOPICAL

## 2012-01-11 MED ORDER — HEPARIN (PORCINE) IN NACL 2-0.9 UNIT/ML-% IJ SOLN
INTRAMUSCULAR | Status: AC
  Filled 2012-01-11: qty 1000

## 2012-01-11 MED ORDER — HYDROCODONE-ACETAMINOPHEN 5-325 MG PO TABS: 1.0000 | ORAL_TABLET | ORAL | Status: DC | PRN

## 2012-01-11 MED ORDER — ACETAMINOPHEN 325 MG PO TABS: 325.0000 mg | ORAL_TABLET | ORAL | Status: DC | PRN

## 2012-01-11 MED ORDER — VANCOMYCIN HCL IN DEXTROSE 1-5 GM/200ML-% IV SOLN
1000.0000 mg | Freq: Two times a day (BID) | INTRAVENOUS | Status: AC
  Administered 2012-01-11: 1000 mg via INTRAVENOUS
  Filled 2012-01-11: qty 200

## 2012-01-11 MED ORDER — FENTANYL CITRATE 0.05 MG/ML IJ SOLN
INTRAMUSCULAR | Status: AC
  Filled 2012-01-11: qty 2

## 2012-01-11 MED ORDER — SODIUM CHLORIDE 0.9 % IV SOLN
INTRAVENOUS | Status: AC
  Administered 2012-01-11: 12:00:00 via INTRAVENOUS

## 2012-01-11 MED ORDER — VANCOMYCIN HCL 1000 MG IV SOLR: 1000.0000 mg | INTRAVENOUS | Status: DC

## 2012-01-11 MED ORDER — MUPIROCIN 2 % EX OINT
1.0000 "application " | TOPICAL_OINTMENT | Freq: Two times a day (BID) | CUTANEOUS | Status: DC
  Administered 2012-01-11 – 2012-01-12 (×2): 1 via NASAL
  Filled 2012-01-11 (×2): qty 22

## 2012-01-11 NOTE — Progress Notes (Signed)
Physical Therapy Treatment Patient Details Name: Danielle Benitez MRN: [id number] DOB: Mar 03, 1932 Today's Date: 01/11/2012  PT Assessment/Plan  PT - Assessment/Plan Comments on Treatment Session: Patient s/p pacemaker insertion this am little unsteady and groggy with meds from procedure.  Still participates happliy.  Will need HHPT at d/c.  May try with cane at next session. PT Plan: Discharge plan remains appropriate PT Frequency: Min 3X/week Follow Up Recommendations: Home health PT Equipment Recommended: None recommended by PT PT Goals  Acute Rehab PT Goals Pt will go Sit to Stand: with modified independence PT Goal: Sit to Stand - Progress: Progressing toward goal Pt will go Stand to Sit: with modified independence PT Goal: Stand to Sit - Progress: Progressing toward goal Pt will Ambulate: >150 feet;with modified independence;with least restrictive assistive device PT Goal: Ambulate - Progress: Progressing toward goal  PT Treatment Precautions/Restrictions  Precautions Precautions: Fall;ICD/Pacemaker Precaution Comments: Has a h/o but does not currently want to use an AD Required Braces or Orthoses: Yes Other Brace/Splint: sling left UE s/p pacemaker this am Restrictions Weight Bearing Restrictions: Yes LUE Weight Bearing: Non weight bearing Mobility (including Balance) Bed Mobility Supine to Sit: 5: Supervision Supine to Sit Details (indicate cue type and reason): scooting and pushing with right UE only Sit to Supine: 3: Mod assist Sit to Supine - Details (indicate cue type and reason): for LE's into bed Transfers Sit to Stand: 4: Min assist;5: Supervision;From bed;From toilet Sit to Stand Details (indicate cue type and reason): min assist from bed and initally from toilet, then supervision after 2-3 repititions for hygiene Stand to Sit: 4: Min assist;To toilet;To bed Stand to Sit Details: for safety Ambulation/Gait Ambulation/Gait Assistance: 3: Mod assist Ambulation/Gait  Assistance Details (indicate cue type and reason): patient sleep with meds following pacemaker, forward flexed and still unsteady despite pushing IV pole with right UE Ambulation Distance (Feet): 12 Feet (times two going to and from bathroom) Gait Pattern: Trunk flexed;Decreased stride length  Balance Balance Assessed: Yes Static Standing Balance Static Standing - Balance Support: No upper extremity supported Static Standing - Level of Assistance: 4: Min assist Static Standing - Comment/# of Minutes: standing to wipe following toileting Exercise    End of Session PT - End of Session Equipment Utilized During Treatment: Gait belt Activity Tolerance: Patient limited by fatigue Patient left: in bed;with call bell in reach General Behavior During Session: [facility] for tasks performed Cognition: [facility] for tasks performed  [facility] 01/11/2012, 2:50 PM

## 2012-01-11 NOTE — Progress Notes (Signed)
Pt with 4 beat VTach run on telemetry, MD-Croitoru called and notified.  Strip in chart. 01/11/2012 1:32 PM Danielle Benitez, [place]

## 2012-01-11 NOTE — Progress Notes (Signed)
Pt evaluated and discharged from OT yesterday. Noted new order and noted events from yesterday PM with pt now in step down. Noted plans for pacemaker---will need a re-order post pacemaker to go over BADLs with arm movement precautions--please  Re-order OT post pacemaker. Acute OT signing off at this time. Thank you. Ignacia Palma, [place] [phone] 01/11/2012

## 2012-01-11 NOTE — Progress Notes (Signed)
[facility]  DAILY PROGRESS NOTE   Subjective:  Sore in Rt groin, but better compared to last PM.  Objective:  Temp:  [97.2 F (36.2 C)-98.6 F (37 C)] 97.2 F (36.2 C) (04/02 0721) Pulse Rate:  [37-66] 47  (04/02 0700) Resp:  [12-19] 15  (04/02 0700) BP: (117-196)/(41-84) 147/43 mmHg (04/02 0700) SpO2:  [93 %-100 %] 100 % (04/02 0700) Weight:  [78.8 kg (173 lb 11.6 oz)] 78.8 kg (173 lb 11.6 oz) (04/01 1234) Weight change:   Intake/Output from previous day: 04/01 0701 - 04/02 0700 In: 397 [I.V.:397] Out: -   Intake/Output from this shift:    Medications: Current Facility-Administered Medications  Medication Dose Route Frequency Provider Last Rate Last Dose  . 0.45 % sodium chloride infusion   Intravenous Continuous Eda Paschal [place], PA      . 0.9 %  sodium chloride infusion   Intravenous Continuous Belkys A Regalado, MD 50 mL/hr at 01/10/12 1101 50 mL at 01/10/12 1101  . 0.9 %  sodium chloride infusion  1 mL/kg/hr Intravenous Continuous Chrystie Nose, MD 78.8 mL/hr at 01/10/12 1830 1 mL/kg/hr at 01/10/12 1830  . 0.9 %  sodium chloride infusion   Intravenous Continuous Eda Paschal [place], [place] 50 mL/hr at 01/11/12 2956    . acetaminophen (TYLENOL) tablet 650 mg  650 mg Oral Q6H PRN Ron Parker, MD   650 mg at 01/09/12 2204   Or  . acetaminophen (TYLENOL) suppository 650 mg  650 mg Rectal Q6H PRN Ron Parker, MD      . acetaminophen (TYLENOL) tablet 650 mg  650 mg Oral Q4H PRN Chrystie Nose, MD      . allopurinol (ZYLOPRIM) tablet 300 mg  300 mg Oral Daily Belkys A Regalado, MD   300 mg at 01/10/12 0955  . alum & mag hydroxide-simeth (MAALOX/MYLANTA) 200-200-20 MG/5ML suspension 30 mL  30 mL Oral Q6H PRN Ron Parker, MD      . aspirin EC tablet 325 mg  325 mg Oral Daily Ron Parker, MD   325 mg at 01/10/12 0955  . atorvastatin (LIPITOR) tablet 40 mg  40 mg Oral q1800 Eda Paschal [place], [place]      . chlorhexidine (HIBICLENS) 4 %  liquid           . chlorhexidine (HIBICLENS) 4 % liquid   Topical Once Duke Salvia, MD      . Chlorhexidine Gluconate Cloth 2 % PADS 6 each  6 each Topical Q0600 Cristal Ford, MD   6 each at 01/11/12 0615  . diazepam (VALIUM) tablet 5 mg  5 mg Oral On Call Abelino Derrick, PA   5 mg at 01/10/12 1607  . diazepam (VALIUM) tablet 5 mg  5 mg Oral On Call Abelino Derrick, PA      . fentaNYL (SUBLIMAZE) 0.05 MG/ML injection           . fluticasone (FLONASE) 50 MCG/ACT nasal spray 1 spray  1 spray Each Nare Daily Belkys A Regalado, MD   1 spray at 01/10/12 0955  . gentamicin (GARAMYCIN) 80 mg in sodium chloride irrigation 0.9 % 500 mL irrigation  80 mg Irrigation On Call [employer], PA      . heparin 2-0.9 UNIT/ML-% infusion           . hydrALAZINE (APRESOLINE) 20 MG/ML injection           . hydrALAZINE (APRESOLINE) tablet  25 mg  25 mg Oral Q8H PRN Chrystie Nose, MD      . HYDROmorphone (DILAUDID) injection 0.5-1 mg  0.5-1 mg Intravenous Q3H PRN Ron Parker, MD   1 mg at 01/10/12 2101  . insulin aspart (novoLOG) injection 0-5 Units  0-5 Units Subcutaneous QHS Harvette C Jenkins, MD      . insulin aspart (novoLOG) injection 0-9 Units  0-9 Units Subcutaneous TID WC Ron Parker, MD   1 Units at 01/11/12 [phone]  . lidocaine (XYLOCAINE) 1 % injection           . lisinopril (PRINIVIL,ZESTRIL) tablet 40 mg  40 mg Oral Daily Belkys A Regalado, MD   40 mg at 01/10/12 1100  . midazolam (VERSED) 2 MG/2ML injection           . mupirocin ointment (BACTROBAN) 2 % 1 application  1 application Nasal BID Cristal Ford, MD      . nitroGLYCERIN (NTG ON-CALL) 0.2 mg/mL injection           . ondansetron (ZOFRAN) tablet 4 mg  4 mg Oral Q6H PRN Ron Parker, MD       Or  . ondansetron [facility]) injection 4 mg  4 mg Intravenous Q6H PRN Ron Parker, MD   4 mg at 01/10/12 2303  . ondansetron (ZOFRAN) injection 4 mg  4 mg Intravenous Q6H PRN Chrystie Nose, MD      . oxyCODONE (Oxy  IR/ROXICODONE) immediate release tablet 5 mg  5 mg Oral Q4H PRN Ron Parker, MD   5 mg at 01/10/12 2029  . DISCONTD: 0.9 %  sodium chloride infusion  250 mL Intravenous PRN Abelino Derrick, PA      . DISCONTD: 0.9 %  sodium chloride infusion   Intravenous Continuous Abelino Derrick, PA 75 mL/hr at 01/10/12 1426    . DISCONTD: ceFAZolin (ANCEF) IVPB 2 g/50 mL premix  2 g Intravenous On Call Abelino Derrick, PA      . DISCONTD: enoxaparin (LOVENOX) injection 40 mg  40 mg Subcutaneous Q24H Ron Parker, MD   40 mg at 01/09/12 2204  . DISCONTD: enoxaparin (LOVENOX) injection 40 mg  40 mg Subcutaneous Q24H Eda Paschal [place], [place]      . DISCONTD: sertraline (ZOLOFT) tablet 50 mg  50 mg Oral Daily Belkys A Regalado, MD   50 mg at 01/09/12 1546  . DISCONTD: sodium chloride 0.9 % injection 3 mL  3 mL Intravenous Q12H Ron Parker, MD   3 mL at 01/10/12 1000  . DISCONTD: sodium chloride 0.9 % injection 3 mL  3 mL Intravenous PRN Abelino Derrick, PA      . DISCONTD: vancomycin (VANCOCIN) powder 1,000 mg  1,000 mg Other To OR Thurmon Fair, MD        Physical Exam:General appearance: alert, cooperative and no distress  Neck: no JVD, supple, symmetrical, trachea midline and soft transmitted murmur to Lt carotid  Lungs: clear to auscultation bilaterally  Heart: regular rate and rhythm and 2/6 systolic murmur LSB  Abdomen: soft, non-tender; bowel sounds normal; no masses, no organomegaly  Extremities: extremities normal, atraumatic, no cyanosis or edema  Pulses: 2+ and symmetric  Skin: Skin color, texture, turgor normal. No rashes or lesions ; there is an extensive ecchymosis in Rt groin, but there is no palpable hematoma, audible bruit or active bleeding. Neurologic: Grossly normal   Lab Results: Results for orders placed during the hospital encounter  of 01/08/12 (from the past 48 hour(s))  GLUCOSE, CAPILLARY     Status: Abnormal   Collection Time   01/09/12 11:46 AM      Component Value Range  Comment   Glucose-Capillary 116 (*) 70 - 99 (mg/dL)   CARDIAC PANEL(CRET KIN+CKTOT+MB+TROPI)     Status: Normal   Collection Time   01/09/12 11:56 AM      Component Value Range Comment   Total CK 62  7 - 177 (U/L)    CK, MB 2.2  0.3 - 4.0 (ng/mL)    Troponin I <0.30  <0.30 (ng/mL)    Relative Index RELATIVE INDEX IS INVALID  0.0 - 2.5    TSH     Status: Normal   Collection Time   01/09/12 11:56 AM      Component Value Range Comment   TSH 1.994  0.350 - 4.500 (uIU/mL)   GLUCOSE, CAPILLARY     Status: Abnormal   Collection Time   01/09/12  4:58 PM      Component Value Range Comment   Glucose-Capillary 125 (*) 70 - 99 (mg/dL)   GLUCOSE, CAPILLARY     Status: Abnormal   Collection Time   01/09/12  9:10 PM      Component Value Range Comment   Glucose-Capillary 157 (*) 70 - 99 (mg/dL)   CBC     Status: Abnormal   Collection Time   01/10/12  5:30 AM      Component Value Range Comment   WBC 5.2  4.0 - 10.5 (K/uL)    RBC 3.39 (*) 3.87 - 5.11 (MIL/uL)    Hemoglobin 9.7 (*) 12.0 - 15.0 (g/dL)    HCT 16.1 (*) 09.6 - 46.0 (%)    MCV 90.3  78.0 - 100.0 (fL)    MCH 28.6  26.0 - 34.0 (pg)    MCHC 31.7  30.0 - 36.0 (g/dL)    RDW 04.5  40.9 - 81.1 (%)    Platelets 121 (*) 150 - 400 (K/uL)   BASIC METABOLIC PANEL     Status: Abnormal   Collection Time   01/10/12  5:30 AM      Component Value Range Comment   Sodium 140  135 - 145 (mEq/L)    Potassium 4.4  3.5 - 5.1 (mEq/L)    Chloride 109  96 - 112 (mEq/L)    CO2 23  19 - 32 (mEq/L)    Glucose, Bld 109 (*) 70 - 99 (mg/dL)    BUN 21  6 - 23 (mg/dL)    Creatinine, Ser 9.14  0.50 - 1.10 (mg/dL)    Calcium 8.9  8.4 - 10.5 (mg/dL)    GFR calc non Af Amer 55 (*) >90 (mL/min)    GFR calc Af Amer 64 (*) >90 (mL/min)   GLUCOSE, CAPILLARY     Status: Normal   Collection Time   01/10/12  7:44 AM      Component Value Range Comment   Glucose-Capillary 91  70 - 99 (mg/dL)   GLUCOSE, CAPILLARY     Status: Abnormal   Collection Time   01/10/12 11:28 AM       Component Value Range Comment   Glucose-Capillary 125 (*) 70 - 99 (mg/dL)    Comment 1 Notify RN     PROTIME-INR     Status: Normal   Collection Time   01/10/12  1:49 PM      Component Value Range Comment   Prothrombin Time 14.0  11.6 -  15.2 (seconds)    INR 1.06  0.00 - 1.49    GLUCOSE, CAPILLARY     Status: Normal   Collection Time   01/10/12  5:16 PM      Component Value Range Comment   Glucose-Capillary 90  70 - 99 (mg/dL)   GLUCOSE, CAPILLARY     Status: Abnormal   Collection Time   01/10/12  8:20 PM      Component Value Range Comment   Glucose-Capillary 103 (*) 70 - 99 (mg/dL)    Comment 1 Notify RN     CBC     Status: Abnormal   Collection Time   01/10/12  9:15 PM      Component Value Range Comment   WBC 9.7  4.0 - 10.5 (K/uL)    RBC 3.70 (*) 3.87 - 5.11 (MIL/uL)    Hemoglobin 10.7 (*) 12.0 - 15.0 (g/dL)    HCT 96.0 (*) 45.4 - 46.0 (%)    MCV 89.2  78.0 - 100.0 (fL)    MCH 28.9  26.0 - 34.0 (pg)    MCHC 32.4  30.0 - 36.0 (g/dL)    RDW 09.8  11.9 - 14.7 (%)    Platelets 142 (*) 150 - 400 (K/uL)   GLUCOSE, CAPILLARY     Status: Abnormal   Collection Time   01/10/12  9:34 PM      Component Value Range Comment   Glucose-Capillary 137 (*) 70 - 99 (mg/dL)    Comment 1 Documented in Chart      Comment 2 Notify RN     MRSA PCR SCREENING     Status: Abnormal   Collection Time   01/10/12  9:44 PM      Component Value Range Comment   MRSA by PCR POSITIVE (*) NEGATIVE    CBC     Status: Abnormal   Collection Time   01/11/12  6:10 AM      Component Value Range Comment   WBC 8.8  4.0 - 10.5 (K/uL)    RBC 3.45 (*) 3.87 - 5.11 (MIL/uL)    Hemoglobin 9.9 (*) 12.0 - 15.0 (g/dL)    HCT 82.9 (*) 56.2 - 46.0 (%)    MCV 90.4  78.0 - 100.0 (fL)    MCH 28.7  26.0 - 34.0 (pg)    MCHC 31.7  30.0 - 36.0 (g/dL)    RDW 13.0  86.5 - 78.4 (%)    Platelets 125 (*) 150 - 400 (K/uL)     Imaging: Imaging results have been reviewed  Assessment:  1. Principal Problem: 2.  *Chest pain at  rest, (atypical) 3. Active Problems: 4.  SOB (shortness of breath) 5.  Hypertension 6.  Diabetes mellitus 7.  COPD bronchitis by CXR 8.  Dyslipidemia 9.  CAD, mild, 30% RCA in 2005 10.  Fatigue 11.  Sinus bradycardia, HR as low As 35 12.  Aortic stenosis, NL LVF, 2D 01/09/12 13.  Anemia, hgb 9.7 14. Rt gorin hematoma, stable - no change in Hgb  Plan:  1. Risks and benefits of dual chamber permanent pacemaker reviewed in detail with patient and family and they agree to proceed.  Time Spent Directly with Patient:  45 minutes  Length of Stay:  LOS: 3 days    Danielle Benitez 01/11/2012, 8:19 AM

## 2012-01-11 NOTE — CV Procedure (Signed)
Procedure report Danielle Benitez, Danielle Benitez Female, 78 y.o., Feb 14, 1932  MRN: [id number]  Procedure performed: 1. Implantation of new dual chamber permanent pacemaker 2. Fluoroscopy 3.  Light sedation  Reason for procedure: Symptomatic bradycardia due to: Sinus node dysfunction  Procedure performed by: Thurmon Fair, MD  Complications: None  Estimated blood loss: <10 mL  Medications administered during procedure: Vancomycin 1 g intravenously Lidocaine 1% 30 mL locally,  Fentanyl 75 mcg intravenously Versed 2 mg intravenously  Device details:  Generator Medtronic revo model RVD R01 serial number [id number] H Right atrial lead Medtronic 5086-MRI-52 cm serial number [id number] Right ventricular lead Medtronic 5086-MRI-52 cm serial number [id number]  Procedure details:  After the risks and benefits of the procedure were discussed the patient provided informed consent and was brought to the cardiac cath lab in the fasting state. The patient was prepped and draped in usual sterile fashion. Local anesthesia with 1% lidocaine was administered to to the left infraclavicular area. A 5-6 cm horizontal incision was made parallel with and 2-3 cm caudal to the left clavicle. Using electrocautery and blunt dissection a prepectoral pocket was created down to the level of the pectoralis major muscle fascia. The pocket was carefully inspected for hemostasis. An antibiotic-soaked sponge was placed in the pocket.  Under fluoroscopic guidance and using the modified Seldinger technique 2 separate venipunctures were performed to access the left subclavian vein. no difficulty was encountered accessing the vein.  Two J-tip guidewires were subsequently exchanged for two 8 French safe sheaths.  Under fluoroscopic guidance the ventricular lead was advanced to the level of the right ventricle. Multiple sites were mapped out in all locations sensing was very poor to mediocre. R waves vary between one and 4 mV. The  ventricular lead was eventually deployed at the level of the mid to apical right ventricular septum and the active-fixation helix was deployed. Prominent current of injury was seen. Satisfactory pacing and sensing parameters were recorded. There was no evidence of diaphragmatic stimulation at maximum device output. The safe sheath was peeled away and the lead was secured in place with 2-0 silk.  In similar fashion the right atrial lead was advanced to the level of the atrial appendage. The active-fixation helix was deployed. There was prominent current of injury. Satisfactory  pacing and sensing parameters were recorded. There was no evidence of diaphragmatic stimulation with pacing at maximum device output. The safe sheath was peeled away and the lead was secured in place with 2-0 silk.  The antibiotic-soaked sponge was removed from the pocket. The pocket was flushed with copious amounts of antibiotic solution. Reinspection showed excellent hemostasis..  The ventricular lead was connected to the generator and appropriate ventricular pacing was seen. Subsequently the atrial lead was also connected. Repeat testing of the lead parameters later showed excellent values.  The entire system was then carefully inserted in the pocket with care been taking that the leads and device assumed a comfortable position without pressure on the incision. Great care was taken that the leads be located deep to the generator. The pocket was then closed in layers using 2 layers of 2-0 Vicryl and cutaneous staples, after which a sterile dressing was applied.  At the end of the procedure the following lead parameters were encountered:  Right atrial lead  sensed P waves 1.8-2.3 mV, impedance 772ohms, threshold 1.6 V at 0.5 ms pulse width.  Right ventricular lead sensed R waves 3.4 mV, impedance 925ohms, threshold 0.7 V at 0.5 ms pulse width.  Lealand Elting  Elease Swarm, MD, [facility] [phone]  office [phone] pager 01/11/2012 11:11 AM

## 2012-01-11 NOTE — Progress Notes (Signed)
Patient came from Cath unit after pacemaker placement.  Patient open eyes to voice, denies chest pain. Say I am sleepy. No complaints.   Diabetes mellitus (01/08/2012) Discharge only on metformin if renal function stable. Stop glyburide patient BS 70 and 120. On admission had episode hypoglycemia.  Symptomatic Bradycardia: SP pacemaker.   I spoke with Dr Royann Shivers, will transfer patient to his service. Please call triad with questions as needed.  Danielle Benitez.

## 2012-01-12 ENCOUNTER — Other Ambulatory Visit: Payer: Self-pay

## 2012-01-12 ENCOUNTER — Encounter (HOSPITAL_COMMUNITY): Payer: Self-pay

## 2012-01-12 ENCOUNTER — Inpatient Hospital Stay (HOSPITAL_COMMUNITY): Payer: Medicare Other

## 2012-01-12 LAB — GLUCOSE, CAPILLARY

## 2012-01-12 MED ORDER — ACETAMINOPHEN 325 MG PO TABS: 650.0000 mg | ORAL_TABLET | Freq: Four times a day (QID) | ORAL | Status: AC | PRN

## 2012-01-12 MED ORDER — ASPIRIN 325 MG PO TBEC: 325.0000 mg | DELAYED_RELEASE_TABLET | Freq: Every day | ORAL | Status: AC

## 2012-01-12 MED ORDER — ATORVASTATIN CALCIUM 40 MG PO TABS: 20.0000 mg | ORAL_TABLET | Freq: Every day | ORAL | Status: DC

## 2012-01-12 MED ORDER — HYDROCODONE-ACETAMINOPHEN 5-325 MG PO TABS: 1.0000 | ORAL_TABLET | ORAL | Status: AC | PRN

## 2012-01-12 MED ORDER — HYDRALAZINE HCL 25 MG PO TABS: 25.0000 mg | ORAL_TABLET | Freq: Three times a day (TID) | ORAL | Status: DC | PRN

## 2012-01-12 NOTE — Progress Notes (Signed)
[facility]  Subjective: Sore at pacer site.  No SOB.  Objective: Vital signs in last 24 hours: Temp:  [97.1 F (36.2 C)-98.6 F (37 C)] 98.4 F (36.9 C) (04/03 0800) Pulse Rate:  [55-60] 60  (04/03 0800) Resp:  [10-23] 19  (04/03 0800) BP: (90-155)/(42-86) 123/60 mmHg (04/03 0800) SpO2:  [93 %-99 %] 96 % (04/03 0800) Weight:  [79.2 kg (174 lb 9.7 oz)-80.4 kg (177 lb 4 oz)] 79.2 kg (174 lb 9.7 oz) (04/03 0539) Last BM Date: 01/11/12  Intake/Output from previous day: 04/02 0701 - 04/03 0700 In: 1270 [P.O.:680; I.V.:290; IV Piggyback:200] Out: -  Intake/Output this shift:    Medications Current Facility-Administered Medications  Medication Dose Route Frequency Provider Last Rate Last Dose  . 0.9 %  sodium chloride infusion   Intravenous Continuous Haruye Lainez, MD      . acetaminophen (TYLENOL) tablet 650 mg  650 mg Oral Q6H PRN Ron Parker, MD   650 mg at 01/09/12 2204  . allopurinol (ZYLOPRIM) tablet 300 mg  300 mg Oral Daily Belkys A Regalado, MD   300 mg at 01/10/12 0955  . ALPRAZolam Prudy Feeler) tablet 0.25 mg  0.25 mg Oral TID PRN Abelino Derrick, PA      . alum & mag hydroxide-simeth (MAALOX/MYLANTA) 200-200-20 MG/5ML suspension 30 mL  30 mL Oral Q6H PRN Ron Parker, MD      . aspirin EC tablet 325 mg  325 mg Oral Daily Ron Parker, MD   325 mg at 01/10/12 0955  . atorvastatin (LIPITOR) tablet 40 mg  40 mg Oral q1800 Eda Paschal [place], [place]   40 mg at 01/11/12 1806  . chlorhexidine (HIBICLENS) 4 % liquid           . chlorhexidine (HIBICLENS) 4 % liquid   Topical Once Duke Salvia, MD      . Chlorhexidine Gluconate Cloth 2 % PADS 6 each  6 each Topical Q0600 Cristal Ford, MD   6 each at 01/12/12 0559  . fentaNYL (SUBLIMAZE) 0.05 MG/ML injection           . fluticasone (FLONASE) 50 MCG/ACT nasal spray 1 spray  1 spray Each Nare Daily Belkys A Regalado, MD   1 spray at 01/10/12 0955  . heparin 2-0.9 UNIT/ML-% infusion             . hydrALAZINE (APRESOLINE) tablet 25 mg  25 mg Oral Q8H PRN Chrystie Nose, MD      . HYDROcodone-acetaminophen (NORCO) 5-325 MG per tablet 1-2 tablet  1-2 tablet Oral Q4H PRN Lovena Kluck, MD      . HYDROmorphone (DILAUDID) injection 0.5-1 mg  0.5-1 mg Intravenous Q3H PRN Ron Parker, MD   1 mg at 01/10/12 2101  . insulin aspart (novoLOG) injection 0-5 Units  0-5 Units Subcutaneous QHS Harvette C Jenkins, MD      . insulin aspart (novoLOG) injection 0-9 Units  0-9 Units Subcutaneous TID WC Ron Parker, MD   3 Units at 01/11/12 1823  . lidocaine (XYLOCAINE) 1 % injection           . lisinopril (PRINIVIL,ZESTRIL) tablet 40 mg  40 mg Oral Daily Belkys A Regalado, MD   40 mg at 01/10/12 1100  . midazolam (VERSED) 2 MG/2ML injection           . mupirocin ointment (BACTROBAN) 2 % 1 application  1 application Nasal BID Cristal Ford, MD  1 application at 01/11/12 2200  . ondansetron (ZOFRAN) injection 4 mg  4 mg Intravenous Q6H PRN Ron Parker, MD   4 mg at 01/10/12 2303  . vancomycin (VANCOCIN) IVPB 1000 mg/200 mL premix  1,000 mg Intravenous On Call Calvert Cantor, MD      . vancomycin (VANCOCIN) IVPB 1000 mg/200 mL premix  1,000 mg Intravenous Q12H Raevon Broom, MD   1,000 mg at 01/11/12 1957  . zolpidem (AMBIEN) tablet 5 mg  5 mg Oral QHS PRN Abelino Derrick, PA   5 mg at 01/11/12 2303  . DISCONTD: 0.45 % sodium chloride infusion   Intravenous Continuous Eda Paschal [place], [place]      . DISCONTD: 0.9 %  sodium chloride infusion   Intravenous Continuous Belkys A Regalado, MD 50 mL/hr at 01/11/12 1135    . DISCONTD: 0.9 %  sodium chloride infusion   Intravenous Continuous Abelino Derrick, [place] 50 mL/hr at 01/11/12 1610    . DISCONTD: acetaminophen (TYLENOL) suppository 650 mg  650 mg Rectal Q6H PRN Ron Parker, MD      . DISCONTD: acetaminophen (TYLENOL) tablet 325-650 mg  325-650 mg Oral Q4H PRN Thurmon Fair, MD      . DISCONTD: acetaminophen (TYLENOL) tablet 650 mg  650 mg  Oral Q4H PRN Chrystie Nose, MD      . DISCONTD: Chlorhexidine Gluconate Cloth 2 % PADS 6 each  6 each Topical Q0600 Parissa Chiao, MD      . DISCONTD: gentamicin (GARAMYCIN) 80 mg in sodium chloride irrigation 0.9 % 500 mL irrigation  80 mg Irrigation On Call Abelino Derrick, PA      . DISCONTD: mupirocin ointment (BACTROBAN) 2 % 1 application  1 application Nasal BID Thurmon Fair, MD      . DISCONTD: ondansetron (ZOFRAN) injection 4 mg  4 mg Intravenous Q6H PRN Chrystie Nose, MD      . DISCONTD: ondansetron (ZOFRAN) injection 4 mg  4 mg Intravenous Q6H PRN Dazhane Villagomez, MD      . DISCONTD: ondansetron (ZOFRAN) tablet 4 mg  4 mg Oral Q6H PRN Ron Parker, MD      . DISCONTD: oxyCODONE (Oxy IR/ROXICODONE) immediate release tablet 5 mg  5 mg Oral Q4H PRN Ron Parker, MD   5 mg at 01/10/12 2029    PE: General appearance: alert, cooperative and no distress Lungs: clear to auscultation bilaterally Heart: regular rate and rhythm Extremities: No LEE Pulses: 2+ and symmetric Pacer site:  No further expansion of drainage from pacer site. Right Groin:  Mildly tender.  No hematoma or ecchymosis.  Lab Results:   Basename 01/11/12 0610 01/10/12 2115 01/10/12 0530  WBC 8.8 9.7 5.2  HGB 9.9* 10.7* 9.7*  HCT 31.2* 33.0* 30.6*  PLT 125* 142* 121*   BMET  Basename 01/10/12 0530  NA 140  K 4.4  CL 109  CO2 23  GLUCOSE 109*  BUN 21  CREATININE 0.96  CALCIUM 8.9   PT/INR  Basename 01/10/12 1349  LABPROT 14.0  INR 1.06   Cholesterol No results found for this basename: CHOL in the last 72 hours Cardiac Enzymes No components found with this basename: TROPONIN:3, CKMB:3  Studies/Results: Procedure performed:  1. Implantation of new dual chamber permanent pacemaker  2. Fluoroscopy  3. Light sedation  Reason for procedure:  Symptomatic bradycardia due to:  Sinus node dysfunction  Procedure performed by:  Thurmon Fair, MD  Complications:  None  Estimated  blood  loss:  <10 mL  Medications administered during procedure:  Vancomycin 1 g intravenously  Lidocaine 1% 30 mL locally,  Fentanyl 75 mcg intravenously  Versed 2 mg intravenously  Device details:  Generator Medtronic revo model RVD R01 serial number [id number] [id number]  Right atrial lead Medtronic 5086-MRI-52 cm serial number LFP [id number]  Right ventricular lead Medtronic 5086-MRI-52 cm serial number [id number]  Procedure details:  After the risks and benefits of the procedure were discussed the patient provided informed consent and was brought to the cardiac cath lab in the fasting state. The patient was prepped and draped in usual sterile fashion. Local anesthesia with 1% lidocaine was administered to to the left infraclavicular area. A 5-6 cm horizontal incision was made parallel with and 2-3 cm caudal to the left clavicle. Using electrocautery and blunt dissection a prepectoral pocket was created down to the level of the pectoralis major muscle fascia. The pocket was carefully inspected for hemostasis. An antibiotic-soaked sponge was placed in the pocket.  Under fluoroscopic guidance and using the modified Seldinger technique 2 separate venipunctures were performed to access the left subclavian vein. no difficulty was encountered accessing the vein. Two J-tip guidewires were subsequently exchanged for two 8 French safe sheaths.  Under fluoroscopic guidance the ventricular lead was advanced to the level of the right ventricle. Multiple sites were mapped out in all locations sensing was very poor to mediocre. R waves vary between one and 4 mV. The ventricular lead was eventually deployed at the level of the mid to apical right ventricular septum and the active-fixation helix was deployed. Prominent current of injury was seen. Satisfactory pacing and sensing parameters were recorded. There was no evidence of diaphragmatic stimulation at maximum device output. The safe sheath was peeled away and the lead was  secured in place with 2-0 silk.  In similar fashion the right atrial lead was advanced to the level of the atrial appendage. The active-fixation helix was deployed. There was prominent current of injury. Satisfactory pacing and sensing parameters were recorded. There was no evidence of diaphragmatic stimulation with pacing at maximum device output. The safe sheath was peeled away and the lead was secured in place with 2-0 silk.  The antibiotic-soaked sponge was removed from the pocket. The pocket was flushed with copious amounts of antibiotic solution. Reinspection showed excellent hemostasis..  The ventricular lead was connected to the generator and appropriate ventricular pacing was seen. Subsequently the atrial lead was also connected. Repeat testing of the lead parameters later showed excellent values.  The entire system was then carefully inserted in the pocket with care been taking that the leads and device assumed a comfortable position without pressure on the incision. Great care was taken that the leads be located deep to the generator. The pocket was then closed in layers using 2 layers of 2-0 Vicryl and cutaneous staples, after which a sterile dressing was applied.  At the end of the procedure the following lead parameters were encountered:  Right atrial lead  sensed P waves 1.8-2.3 mV, impedance 772ohms, threshold 1.6 V at 0.5 ms pulse width.  Right ventricular lead sensed R waves 3.4 mV, impedance 925ohms, threshold 0.7 V at 0.5 ms pulse width.  Thurmon Fair, MD, [facility]  [facility]  [phone] office  [phone] pager  01/11/2012  11:11 AM  Assessment/Plan  Principal Problem:  *Chest pain at rest, (atypical) Active Problems:  SOB (shortness of breath)  Hypertension  Diabetes mellitus  COPD bronchitis by CXR  Dyslipidemia  CAD, mild, 30% RCA in 2005  Fatigue  Sinus bradycardia, HR as low As 35  Aortic stenosis, NL LVF, 2D 01/09/12  Anemia, hgb  9.7  S/P cardiac pacemaker procedure, PPM Medtronic REVO placed 01/11/2012  Plan:  S/P left heart cath showing no significant obstructive CAD.  EF 65%  S/P Dual chamber PPM for symptomatic bradycardia. POD#1.  No pneumothorax on CXR.   Likely home today.   LOS: 4 days    HAGER,BRYAN W 01/12/2012 8:25 AM  I have seen and examined the patient along with HAGER,BRYAN W, PA.  I have reviewed the chart, notes and new data.  I agree with PA's note.  Key new complaints: Feels better, more energy; sore at pacer site Key examination changes: small amount of oozing at site overnight, now dry Key new findings / data: as expected poor sensing in RV, otherwise good PM parameters. CXR OK  RA P 2.1 mV, impedance 489 ohm, threshold 0.87mV@0 .6ms RV R 2.4-3.5 mV, imp 680 ohm, threshold 1.70mV@0 .1ms 95% AP, 0.2%VP PLAN: DC home, wound check in 7-10 days, PM follow-up in 30 days.  Thurmon Fair, MD, [facility] Center [phone] 01/12/2012, 10:08 AM

## 2012-01-12 NOTE — Discharge Instructions (Signed)

## 2012-01-17 NOTE — Discharge Summary (Signed)
Physician Discharge Summary  Patient ID: BREKYN HUNTOON MRN: [id number] DOB/AGE: Oct 23, 1931 78 y.o.  Admit date: 01/08/2012 Discharge date: 01/17/2012  Admission Diagnoses: Chest Pain  Discharge Diagnoses:  Principal Problem:  *Chest pain at rest, (atypical) Active Problems:  SOB (shortness of breath)  Hypertension  Diabetes mellitus  COPD bronchitis by CXR  Dyslipidemia  CAD, mild, 30% RCA in 2005  Fatigue  Sinus bradycardia, HR as low As 35  Aortic stenosis, NL LVF, 2D 01/09/12  Anemia, hgb 9.7  S/P cardiac pacemaker procedure, PPM Medtronic REVO placed 01/11/2012   Discharged Condition: stable  Hospital Course:   This is a 78 y.o. female with a past medical history significant for prior cardiac cath 2005 which showed 30% RCA. She was admitted with complaints of Rt sided chest pain that radiated down her Rt arm.  Her symptoms were atypical being non-exertion, sharp in nature, and brief in duration. She was noted to have an "abnormal EKG" in the ER and was admitted. Her troponin is negative X2. On telemetry she has had significant bradycardia with rates in the 30s. She is tolerating this well. She does admit to some generalized fatigue but denied any tachycardia or syncope.  The patient was taken for left heart cath on 01/10/12.  This revealed no significant CAD and and EF of 65%.  She was then set up for implantation of a new dual chamber permanent pacemaker.  Post PPM CXR showed no pneumothorax.  The patient was [facility] home in stable condition after being seen by Dr. Royann Shivers.  FU in 7-10days for pacer wound check.   Consults: None  Significant Diagnostic Studies:   01/11/12, PPM Procedure performed:  1. Implantation of new dual chamber permanent pacemaker  2. Fluoroscopy  3. Light sedation  Reason for procedure:  Symptomatic bradycardia due to:  Sinus node dysfunction  Procedure performed by:  Thurmon Fair, MD  Complications:  None  Estimated blood loss:  <10 mL  Medications  administered during procedure:  Vancomycin 1 g intravenously  Lidocaine 1% 30 mL locally,  Fentanyl 75 mcg intravenously  Versed 2 mg intravenously  Device details:  Generator Medtronic revo model RVD R01 serial number [id number] [id number]  Right atrial lead Medtronic 5086-MRI-52 cm serial number [id number]  Right ventricular lead Medtronic 5086-MRI-52 cm serial number [id number] [id number]  Procedure details:  After the risks and benefits of the procedure were discussed the patient provided informed consent and was brought to the cardiac cath lab in the fasting state. The patient was prepped and draped in usual sterile fashion. Local anesthesia with 1% lidocaine was administered to to the left infraclavicular area. A 5-6 cm horizontal incision was made parallel with and 2-3 cm caudal to the left clavicle. Using electrocautery and blunt dissection a prepectoral pocket was created down to the level of the pectoralis major muscle fascia. The pocket was carefully inspected for hemostasis. An antibiotic-soaked sponge was placed in the pocket.  Under fluoroscopic guidance and using the modified Seldinger technique 2 separate venipunctures were performed to access the left subclavian vein. no difficulty was encountered accessing the vein. Two J-tip guidewires were subsequently exchanged for two 8 French safe sheaths.  Under fluoroscopic guidance the ventricular lead was advanced to the level of the right ventricle. Multiple sites were mapped out in all locations sensing was very poor to mediocre. R waves vary between one and 4 mV. The ventricular lead was eventually deployed at the level of the mid to apical right ventricular septum and  the active-fixation helix was deployed. Prominent current of injury was seen. Satisfactory pacing and sensing parameters were recorded. There was no evidence of diaphragmatic stimulation at maximum device output. The safe sheath was peeled away and the lead was secured in place with 2-0 silk.    In similar fashion the right atrial lead was advanced to the level of the atrial appendage. The active-fixation helix was deployed. There was prominent current of injury. Satisfactory pacing and sensing parameters were recorded. There was no evidence of diaphragmatic stimulation with pacing at maximum device output. The safe sheath was peeled away and the lead was secured in place with 2-0 silk.  The antibiotic-soaked sponge was removed from the pocket. The pocket was flushed with copious amounts of antibiotic solution. Reinspection showed excellent hemostasis..  The ventricular lead was connected to the generator and appropriate ventricular pacing was seen. Subsequently the atrial lead was also connected. Repeat testing of the lead parameters later showed excellent values.  The entire system was then carefully inserted in the pocket with care been taking that the leads and device assumed a comfortable position without pressure on the incision. Great care was taken that the leads be located deep to the generator. The pocket was then closed in layers using 2 layers of 2-0 Vicryl and cutaneous staples, after which a sterile dressing was applied.  At the end of the procedure the following lead parameters were encountered:  Right atrial lead  sensed P waves 1.8-2.3 mV, impedance 772ohms, threshold 1.6 V at 0.5 ms pulse width.  Right ventricular lead sensed R waves 3.4 mV, impedance 925ohms, threshold 0.7 V at 0.5 ms pulse width.  Thurmon Fair, MD, [facility]  [phone] office  [phone] pager  01/11/2012  11:11 AM   01/10/12, Left Heart Cath Hemodynamics:  Central Aortic Pressure / Mean Aortic Pressure: 181/62  LV Pressure / LV End diastolic Pressure: 23  Left Ventriculography:  EF: 65%  Wall Motion: Normal. No significant MR.  Coronary Angiographic Data:  Left Main: Angiographically normal but calcified distally.  Left Anterior Descending (LAD): Mild  luminal irregularities, extends to the apex.  1st diagonal (D1): No significant stenosis.  Circumflex (LCx): Large vessel which bifurcates in the mid portion. No significant obstruction.  1st obtuse marginal: Large marginal branch from bifurcation point. No obstructive disease  Right Coronary Artery: There is a 20-30% proximal stenosis. Mild luminal irregularities. No significant obstruction.  posterior descending artery: Patent without significant obstruction.  posterior lateral branch: Patent without significant obstruction. Impression:  1. No significant obstructive CAD.  2. Symptomatic bradycardia with HR in the 30's.  3. LVEF 65%, no WMA's, no regurgitation.  4. Hypertension with high LVEDP.  Plan:  1. Plan permanent pacemaker tomorrow.  2. Blood pressure control with hydralazine overnight.  3. External pacer at the bedside should her HR decline overnight.  01/11/12, CHEST - 2 VIEW:  Post PPM Comparison: Chest x-ray 01/10/2012.  Findings: Compared to the prior examination there is been interval  placement of a left sided pacemaker device with two leads entering  via left subclavian approach with lead tips projecting over the  expected location of the right atrial appendage and right  ventricular apex. No definite pneumothorax or other acute  complicating features. Lungs are well expanded bilaterally,  without consolidative airspace disease or pleural effusions. Mild  pulmonary venous congestion without frank pulmonary edema. Heart  size is borderline enlarged. The patient is rotated to the right on  today's exam,  resulting in distortion of the mediastinal contours  and reduced diagnostic sensitivity and specificity for mediastinal  pathology.  IMPRESSION:  1. Status post left-sided pacemaker implantation, as detailed  above, without pneumothorax or other acute complicating features.  2. Mild pulmonary venous congestion without frank pulmonary edema.   Treatments: Dual Chamber  PPM for symptomatic bradycardia  Discharge Exam: Blood pressure 94/50, pulse 60, temperature 97 F (36.1 C), temperature source Oral, resp. rate 18, height 5\' 2"  (1.575 m), weight 79.2 kg (174 lb 9.7 oz), SpO2 96.00%.   Disposition: 01-Home or Self Care  Discharge Orders    Future Orders Please Complete By Expires   Diet - low sodium heart healthy      Increase activity slowly      Call MD for:  redness, tenderness, or signs of infection (pain, swelling, redness, odor or green/yellow discharge around incision site)        Medication List  As of 01/17/2012 10:34 AM   STOP taking these medications         furosemide 20 MG tablet      glyBURIDE 5 MG tablet         TAKE these medications         acetaminophen 325 MG tablet   Commonly known as: TYLENOL   Take 2 tablets (650 mg total) by mouth every 6 (six) hours as needed (or Fever >/= 101).      allopurinol 300 MG tablet   Commonly known as: ZYLOPRIM   Take 300 mg by mouth daily.      aspirin 325 MG EC tablet   Take 1 tablet (325 mg total) by mouth daily.      atorvastatin 40 MG tablet   Commonly known as: LIPITOR   Take 0.5 tablets (20 mg total) by mouth daily at 6 PM.      hydrALAZINE 25 MG tablet   Commonly known as: APRESOLINE   Take 1 tablet (25 mg total) by mouth every 8 (eight) hours as needed (SBP >160).      HYDROcodone-acetaminophen 5-325 MG per tablet   Commonly known as: NORCO   Take 1-2 tablets by mouth every 4 (four) hours as needed.      lisinopril 40 MG tablet   Commonly known as: PRINIVIL,ZESTRIL   Take 40 mg by mouth daily.      metFORMIN 500 MG tablet   Commonly known as: GLUCOPHAGE   Take 500 mg by mouth 2 (two) times daily with a meal.      sertraline 50 MG tablet   Commonly known as: ZOLOFT   Take 50 mg by mouth daily.           Follow-up Information    Follow up with Thurmon Fair, MD. (Our office will call with appointment date and time)    Contact information:   [address] [place] [address] [phone]          Signed: Dwana Melena 01/17/2012, 10:34 AM

## 2012-02-25 ENCOUNTER — Encounter: Payer: Self-pay | Admitting: Internal Medicine

## 2012-07-25 ENCOUNTER — Ambulatory Visit: Payer: Medicare Other | Admitting: Internal Medicine

## 2012-08-18 ENCOUNTER — Encounter: Payer: Self-pay | Admitting: *Deleted

## 2012-08-18 ENCOUNTER — Ambulatory Visit (INDEPENDENT_AMBULATORY_CARE_PROVIDER_SITE_OTHER): Payer: Medicare Other | Admitting: Internal Medicine

## 2012-08-18 ENCOUNTER — Encounter: Payer: Self-pay | Admitting: Internal Medicine

## 2012-08-18 VITALS — BP 121/72 | HR 72 | Ht 62.0 in | Wt 156.0 lb

## 2012-08-18 DIAGNOSIS — Z950 Presence of cardiac pacemaker: Secondary | ICD-10-CM

## 2012-08-18 DIAGNOSIS — I10 Essential (primary) hypertension: Secondary | ICD-10-CM

## 2012-08-18 DIAGNOSIS — R001 Bradycardia, unspecified: Secondary | ICD-10-CM

## 2012-08-18 DIAGNOSIS — I498 Other specified cardiac arrhythmias: Secondary | ICD-10-CM

## 2012-08-18 LAB — PACEMAKER DEVICE OBSERVATION
AL THRESHOLD: 1 V
BAMS-0001: 170 {beats}/min
BATTERY VOLTAGE: 3.02 V
RV LEAD AMPLITUDE: 6.6 mv

## 2012-08-18 NOTE — Assessment & Plan Note (Signed)
Her Medtronic dual-chamber pacemaker is working normally. We'll plan to recheck in several months. 

## 2012-08-18 NOTE — Progress Notes (Signed)
HPI [name] is self referred today for ongoing evaluation and management of her pacemaker. She is a very pleasant 78 year old woman with a history of symptomatic bradycardia, status post permanent pacemaker insertion, hypertension, diabetes, and arthritis. She has a history of obesity, but has lost some weight in recent months. The patient denies syncope. She has mild peripheral edema. She does admit to sodium indiscretion. Allergies  Allergen Reactions  . Fluzone (Flu Virus Vaccine) Other (See Comments)    Feeling unwell for a week. Cold symptoms/flu symtoms     Current Outpatient Prescriptions  Medication Sig Dispense Refill  . acetaminophen (TYLENOL) 325 MG tablet Take 2 tablets (650 mg total) by mouth every 6 (six) hours as needed (or Fever >/= 101).      Marland Kitchen allopurinol (ZYLOPRIM) 300 MG tablet Take 300 mg by mouth daily.      Marland Kitchen aspirin 81 MG tablet Take 81 mg by mouth daily.      Marland Kitchen atorvastatin (LIPITOR) 40 MG tablet Take 0.5 tablets (20 mg total) by mouth daily at 6 PM.  30 tablet  5  . furosemide (LASIX) 20 MG tablet 1 tab qd      . lisinopril (PRINIVIL,ZESTRIL) 40 MG tablet Take 40 mg by mouth daily.      . metFORMIN (GLUCOPHAGE) 500 MG tablet Take 500 mg by mouth 2 (two) times daily with a meal.         Past Medical History  Diagnosis Date  . Diabetes mellitus   . Hypertension   . Gout   . Osteoarthritis   . Bursitis   . S/P cardiac pacemaker procedure, PPM Medtronic REVO placed 01/11/2012 01/11/2012    ROS:   All systems reviewed and negative except as noted in the HPI.   Past Surgical History  Procedure Date  . Extremity cyst excision      Family History  Problem Relation Age of Onset  . Breast cancer Mother   . Diabetes Daughter      History   Social History  . Marital Status: Widowed    Spouse Name: N/A    Number of Children: N/A  . Years of Education: N/A   Occupational History  . Not on file.   Social History Main Topics  . Smoking status:  Never Smoker   . Smokeless tobacco: Not on file  . Alcohol Use: No  . Drug Use: No  . Sexually Active:    Other Topics Concern  . Not on file   Social History Narrative  . No narrative on file     BP 121/72  Pulse 72  Ht 5\' 2"  (1.575 m)  Wt 156 lb (70.761 kg)  BMI 28.53 kg/m2  Physical Exam:  Well appearing 78 year old woman, NAD HEENT: Unremarkable Neck:  No JVD, no thyromegally Lungs:  Clear with no wheezes, rales, or rhonchi. HEART:  Regular rate rhythm, no murmurs, no rubs, no clicks Abd:  soft, positive bowel sounds, no organomegally, no rebound, no guarding Ext:  2 plus pulses, no edema, no cyanosis, no clubbing Skin:  No rashes no nodules Neuro:  CN II through XII intact, motor grossly intact  EKG Normal sinus rhythm with atrial pacing DEVICE  Normal device function.  See PaceArt for details.   Assess/Plan:

## 2012-08-18 NOTE — Assessment & Plan Note (Signed)
Her blood pressure is well controlled today. She will continue her current medical therapy, and I've asked the patient to maintain a low-sodium diet.

## 2012-08-23 ENCOUNTER — Encounter: Payer: Self-pay | Admitting: Internal Medicine

## 2012-08-28 ENCOUNTER — Encounter: Payer: Self-pay | Admitting: Internal Medicine

## 2012-10-16 ENCOUNTER — Encounter: Payer: Self-pay | Admitting: Internal Medicine

## 2013-01-12 ENCOUNTER — Telehealth: Payer: Self-pay | Admitting: *Deleted

## 2013-01-12 NOTE — Telephone Encounter (Signed)
[name], daughter called and stated that the Cymbalta 20MG  was helping with the depression but not pain. Patient has been having spells for the past two weeks. They were wondering if was the side effects from that or she is not eating right. She only eats a banana for breakfast and maybe some hush puppies for lunch. [name] states that her mother is not eating properly.    [phone]

## 2013-01-19 ENCOUNTER — Telehealth: Payer: Self-pay | Admitting: *Deleted

## 2013-01-19 NOTE — Telephone Encounter (Signed)
Spoke with Dr Chilton Si about the message I sent him and he stated that he did not want to change any medication for the patient at this time.  Danielle Benitez has been notified

## 2013-02-14 ENCOUNTER — Other Ambulatory Visit: Payer: Self-pay | Admitting: Geriatric Medicine

## 2013-02-14 MED ORDER — ALLOPURINOL 300 MG PO TABS: 300.0000 mg | ORAL_TABLET | Freq: Every day | ORAL | Status: DC

## 2013-03-06 ENCOUNTER — Encounter: Payer: Self-pay | Admitting: Internal Medicine

## 2013-03-06 ENCOUNTER — Ambulatory Visit (INDEPENDENT_AMBULATORY_CARE_PROVIDER_SITE_OTHER): Payer: Medicare Other | Admitting: Internal Medicine

## 2013-03-06 VITALS — BP 152/68 | HR 72 | Ht 63.5 in | Wt 153.8 lb

## 2013-03-06 DIAGNOSIS — I498 Other specified cardiac arrhythmias: Secondary | ICD-10-CM

## 2013-03-06 DIAGNOSIS — I10 Essential (primary) hypertension: Secondary | ICD-10-CM

## 2013-03-06 DIAGNOSIS — R001 Bradycardia, unspecified: Secondary | ICD-10-CM

## 2013-03-06 DIAGNOSIS — Z950 Presence of cardiac pacemaker: Secondary | ICD-10-CM

## 2013-03-06 LAB — PACEMAKER DEVICE OBSERVATION
AL IMPEDENCE PM: 376 Ohm
AL THRESHOLD: 0.5 V
RV LEAD AMPLITUDE: 5.2032 mv
RV LEAD IMPEDENCE PM: 512 Ohm

## 2013-03-06 NOTE — Assessment & Plan Note (Signed)
Her blood pressure is slightly elevated today. I've asked the patient to reduce her salt intake. She notes that she did not take her medications earlier today.

## 2013-03-06 NOTE — Patient Instructions (Addendum)
Your physician wants you to follow-up in: 12 months with Dr Taylor You will receive a reminder letter in the mail two months in advance. If you don't receive a letter, please call our office to schedule the follow-up appointment.    Remote monitoring is used to monitor your Pacemaker of ICD from home. This monitoring reduces the number of office visits required to check your device to one time per year. It allows us to keep an eye on the functioning of your device to ensure it is working properly. You are scheduled for a device check from home on 06/12/13. You may send your transmission at any time that day. If you have a wireless device, the transmission will be sent automatically. After your physician reviews your transmission, you will receive a postcard with your next transmission date.   

## 2013-03-06 NOTE — Progress Notes (Signed)
HPI Danielle Benitez returns today for followup. She is a very pleasant elderly woman with a history of symptomatic bradycardia, status post permanent pacemaker insertion. She notes fatigue and weakness with activity. She denies syncope. She has minimal peripheral edema. She denies chest pain or shortness of breath. Allergies  Allergen Reactions  . Fluzone (Flu Virus Vaccine) Other (See Comments)    Feeling unwell for a week. Cold symptoms/flu symtoms     Current Outpatient Prescriptions  Medication Sig Dispense Refill  . allopurinol (ZYLOPRIM) 300 MG tablet Take 1 tablet (300 mg total) by mouth daily.  30 tablet  3  . aspirin 81 MG tablet Take 81 mg by mouth daily.      Marland Kitchen atorvastatin (LIPITOR) 40 MG tablet Take 0.5 tablets (20 mg total) by mouth daily at 6 PM.  30 tablet  5  . DULoxetine (CYMBALTA) 20 MG capsule       . furosemide (LASIX) 20 MG tablet 1 tab qd      . lisinopril (PRINIVIL,ZESTRIL) 40 MG tablet Take 40 mg by mouth daily.      . metFORMIN (GLUCOPHAGE) 500 MG tablet Take 500 mg by mouth 2 (two) times daily with a meal.       No current facility-administered medications for this visit.     Past Medical History  Diagnosis Date  . Diabetes mellitus   . Hypertension   . Gout   . Osteoarthritis   . Bursitis   . S/P cardiac pacemaker procedure, PPM Medtronic REVO placed 01/11/2012 01/11/2012    ROS:   All systems reviewed and negative except as noted in the HPI.   Past Surgical History  Procedure Laterality Date  . Extremity cyst excision       Family History  Problem Relation Age of Onset  . Breast cancer Mother   . Diabetes Daughter      History   Social History  . Marital Status: Widowed    Spouse Name: N/A    Number of Children: N/A  . Years of Education: N/A   Occupational History  . Not on file.   Social History Main Topics  . Smoking status: Never Smoker   . Smokeless tobacco: Not on file  . Alcohol Use: No  . Drug Use: No  . Sexually Active:     Other Topics Concern  . Not on file   Social History Narrative  . No narrative on file     BP 152/68  Pulse 72  Ht 5' 3.5" (1.613 m)  Wt 153 lb 12.8 oz (69.763 kg)  BMI 26.81 kg/m2  Physical Exam:  Well appearing elderly woman,NAD HEENT: Unremarkable Neck:  7 cm JVD, no thyromegally Lungs:  Clear with no wheezes, rales, or rhonchi. Well-healed pacemaker incision. HEART:  Regular rate rhythm, no murmurs, no rubs, no clicks Abd:  soft, positive bowel sounds, no organomegally, no rebound, no guarding Ext:  2 plus pulses, no edema, no cyanosis, no clubbing Skin:  No rashes no nodules Neuro:  CN II through XII intact, motor grossly intact   DEVICE  Normal device function.  See PaceArt for details.   Assess/Plan:

## 2013-03-06 NOTE — Assessment & Plan Note (Signed)
Her MRI compatible Medtronic dual-chamber pacemaker is working normally. We'll plan to recheck in several months.

## 2013-03-15 ENCOUNTER — Telehealth: Payer: Self-pay | Admitting: Internal Medicine

## 2013-03-15 NOTE — Telephone Encounter (Addendum)
When she feel last week her pacemaker may have moved and she is sore but her daughter thinks it is still working properly.  She is not having any symptoms of concern.  They are going to take some Tylenol and call back if she does not continue to improve

## 2013-03-15 NOTE — Telephone Encounter (Signed)
New problem    C/O sore around site - was told by Dr. Ladona Ridgel due to her weight loss.     On last night rub site feel like the pacemaker is sunken back  went into the tissue. Recent fall on left sided not long after her appt with Dr. Ladona Ridgel.

## 2013-04-06 ENCOUNTER — Encounter: Payer: Self-pay | Admitting: *Deleted

## 2013-04-11 ENCOUNTER — Ambulatory Visit: Payer: Self-pay | Admitting: Internal Medicine

## 2013-04-19 ENCOUNTER — Other Ambulatory Visit: Payer: Self-pay | Admitting: Geriatric Medicine

## 2013-04-19 DIAGNOSIS — I10 Essential (primary) hypertension: Secondary | ICD-10-CM

## 2013-04-19 MED ORDER — FUROSEMIDE 20 MG PO TABS: 20.0000 mg | ORAL_TABLET | Freq: Every day | ORAL | Status: DC

## 2013-05-17 ENCOUNTER — Other Ambulatory Visit: Payer: Self-pay | Admitting: *Deleted

## 2013-05-17 DIAGNOSIS — I10 Essential (primary) hypertension: Secondary | ICD-10-CM

## 2013-05-17 MED ORDER — FUROSEMIDE 20 MG PO TABS: ORAL_TABLET | ORAL | Status: DC

## 2013-05-18 ENCOUNTER — Other Ambulatory Visit: Payer: Self-pay | Admitting: *Deleted

## 2013-05-21 ENCOUNTER — Other Ambulatory Visit: Payer: Medicare Other

## 2013-05-21 ENCOUNTER — Other Ambulatory Visit: Payer: Self-pay | Admitting: *Deleted

## 2013-05-21 DIAGNOSIS — E119 Type 2 diabetes mellitus without complications: Secondary | ICD-10-CM

## 2013-05-21 DIAGNOSIS — I10 Essential (primary) hypertension: Secondary | ICD-10-CM

## 2013-05-22 LAB — BASIC METABOLIC PANEL
BUN/Creatinine Ratio: 21 (ref 11–26)
Chloride: 102 mmol/L (ref 97–108)
GFR calc Af Amer: 54 mL/min/{1.73_m2} — ABNORMAL LOW (ref >59)
Potassium: 5 mmol/L (ref 3.5–5.2)
Sodium: 139 mmol/L (ref 134–144)

## 2013-05-23 ENCOUNTER — Other Ambulatory Visit: Payer: Self-pay | Admitting: *Deleted

## 2013-05-23 ENCOUNTER — Ambulatory Visit (INDEPENDENT_AMBULATORY_CARE_PROVIDER_SITE_OTHER): Payer: Medicare Other | Admitting: Internal Medicine

## 2013-05-23 ENCOUNTER — Encounter: Payer: Self-pay | Admitting: Internal Medicine

## 2013-05-23 ENCOUNTER — Telehealth: Payer: Self-pay | Admitting: *Deleted

## 2013-05-23 VITALS — BP 124/86 | HR 93 | Temp 98.0°F | Resp 14 | Ht 63.5 in | Wt 155.0 lb

## 2013-05-23 DIAGNOSIS — D649 Anemia, unspecified: Secondary | ICD-10-CM

## 2013-05-23 DIAGNOSIS — E785 Hyperlipidemia, unspecified: Secondary | ICD-10-CM

## 2013-05-23 DIAGNOSIS — Z950 Presence of cardiac pacemaker: Secondary | ICD-10-CM

## 2013-05-23 DIAGNOSIS — I2510 Atherosclerotic heart disease of native coronary artery without angina pectoris: Secondary | ICD-10-CM

## 2013-05-23 DIAGNOSIS — I10 Essential (primary) hypertension: Secondary | ICD-10-CM

## 2013-05-23 DIAGNOSIS — E119 Type 2 diabetes mellitus without complications: Secondary | ICD-10-CM

## 2013-05-23 MED ORDER — METFORMIN HCL 1000 MG PO TABS: ORAL_TABLET | ORAL | Status: DC

## 2013-05-23 NOTE — Telephone Encounter (Signed)
Patient wants to try the extended release Metformin 500mg  Two tablets twice daily (because pharmacy doesn't have the 1000mg ). She wants to try this because the regular Metformin is causing a urine odor. Please Advise [facility] # [phone]

## 2013-05-23 NOTE — Progress Notes (Signed)
Subjective:    Patient ID: Danielle Benitez, female    DOB: 04-14-1932, 79 y.o.   MRN: [id number]  HPI Appetite is better.  Hypertension: controlled  Diabetes mellitus: A1c is still high  Dyslipidemia: controlled  CAD, mild, 30% RCA in 2005: asymptomatic  Anemia, hgb 9.7in 2013, 11.7 in March 2014: has not been checked recently    Current Outpatient Prescriptions on File Prior to Visit  Medication Sig Dispense Refill  . allopurinol (ZYLOPRIM) 300 MG tablet Take 1 tablet (300 mg total) by mouth daily.  30 tablet  3  . aspirin 81 MG tablet Take 81 mg by mouth daily. Take 1 tablet once daily to prevent  heart attack, stroke and blood clot.      . DULoxetine (CYMBALTA) 20 MG capsule Take 20 mg by mouth daily. Take 1 tablet daily to help treat anxiety and nerves.      . furosemide (LASIX) 20 MG tablet Take one tablet by mouth once daily as needed for diuretic  30 tablet  3  . HYDROcodone-acetaminophen (NORCO/VICODIN) 5-325 MG per tablet Take 1 tablet by mouth every 6 (six) hours as needed for pain. Take 1 tablet up to 4 times daily to control pain.      Marland Kitchen lisinopril (PRINIVIL,ZESTRIL) 40 MG tablet Take 40 mg by mouth daily. Take 1/2 tablet daily for blood pressure.      . metFORMIN (GLUCOPHAGE) 500 MG tablet Take 500 mg by mouth 2 (two) times daily with a meal. Take 1 tablet before breakfast and one tablet before supper to control diabetes mellitus.      Marland Kitchen atorvastatin (LIPITOR) 40 MG tablet Take 0.5 tablets (20 mg total) by mouth daily at 6 PM.  30 tablet  5   No current facility-administered medications on file prior to visit.    Review of Systems  Constitutional: Negative.   HENT: Negative.   Eyes:       Bilateral cataracts  Respiratory: Negative.   Cardiovascular: Positive for leg swelling. Negative for chest pain and palpitations.  Gastrointestinal: Negative.   Endocrine:       Diabetic  Genitourinary: Negative.   Musculoskeletal: Positive for back pain.  Skin: Negative.    Allergic/Immunologic:       History of allergic rhinitis  Neurological:       Occasional headaches. She is aware that she has some mild memory loss.  Hematological: Negative.   Psychiatric/Behavioral:       Depressive symptoms. Processes and worries about members of her family and her health.       Objective:BP 124/86  Pulse 93  Temp(Src) 98 F (36.7 C) (Oral)  Resp 14  Ht 5' 3.5" (1.613 m)  Wt 155 lb (70.308 kg)  BMI 27.02 kg/m2    Physical Exam  Constitutional: She is oriented to person, place, and time.  Morbidly obese  HENT:  Head: Normocephalic and atraumatic.  Right Ear: External ear normal.  Left Ear: External ear normal.  Nose: Nose normal.  Eyes:  Bilateral cataracts  Neck: Normal range of motion. Neck supple. No JVD present. No tracheal deviation present. No thyromegaly present.  Cardiovascular: Normal rate, regular rhythm, normal heart sounds and intact distal pulses.  Exam reveals no gallop and no friction rub.   No murmur heard. Pulmonary/Chest: Effort normal. No respiratory distress. She has no wheezes. She has rales. She exhibits no tenderness.  Pacemaker left upper chest wall  Abdominal: Soft. Bowel sounds are normal. She exhibits no distension and no mass.  Musculoskeletal: Normal range of motion. She exhibits edema. She exhibits no tenderness.  Lymphadenopathy:    She has no cervical adenopathy.  Neurological: She is alert and oriented to person, place, and time. She has normal reflexes. No cranial nerve deficit. Coordination normal.  Skin: No rash noted. No erythema. No pallor.  Psychiatric: She has a normal mood and affect. Her behavior is normal. Thought content normal.      Recent Results (from the past 2160 hour(s))  PACEMAKER DEVICE OBSERVATION     Status: None   Collection Time    03/06/13  3:50 PM      Result Value Range   DEVICE MODEL PM [id number] H     [id number] Lewayne Bunting   M.D.     [id number] Lewayne Bunting   M.D.     [facility]  [facility] NOTES PM       Value: Pacemaker check in clinic. Normal device function. Thresholds, sensing, impedances consistent with previous measurements. Device programmed to maximize longevity. No mode switch or high ventricular rates noted. Device programmed at appropriate safety      margins. Histogram distribution appropriate for patient activity level. Device programmed to optimize intrinsic conduction. Estimated longevity 3.00V. Plan to follow every 3 months remotely and see annually in office. Patient education completed.      [facility] 06/12/13 & ROV in 12 mo w/ GT.   ATRIAL PACING PM 94.46     VENTRICULAR PACING PM 0.04     BATTERY VOLTAGE 3     AL IMPEDENCE PM 376     RV LEAD IMPEDENCE PM 512     AL AMPLITUDE 1.66531     RV LEAD AMPLITUDE 5.2032     AL THRESHOLD 0.5     RV LEAD THRESHOLD 1     BAMS-0001 170    BASIC METABOLIC PANEL     Status: Abnormal   Collection Time    05/21/13  9:32 AM      Result Value Range   Glucose 124 (*) 65 - 99 mg/dL   BUN 23  8 - 27 mg/dL   Creatinine, Ser 3.08 (*) 0.57 - 1.00 mg/dL   GFR calc non Af Amer 47 (*) >59 mL/min/1.73   GFR calc Af Amer 54 (*) >59 mL/min/1.73   BUN/Creatinine Ratio 21  11 - 26   Sodium 139  134 - 144 mmol/L   Potassium 5.0  3.5 - 5.2 mmol/L   Chloride 102  97 - 108 mmol/L   CO2 21  18 - 29 mmol/L   Calcium 9.6  8.6 - 10.2 mg/dL  HEMOGLOBIN M5H     Status: Abnormal   Collection Time    05/21/13  9:32 AM      Result Value Range   Hemoglobin A1C 7.5 (*) 4.8 - 5.6 %   Comment:          Increased risk for diabetes: 5.7 - 6.4              Diabetes: >6.4              Glycemic control for adults with diabetes: <7.0   Estimated average glucose 169         Assessment & Plan:  Hypertension: Controlled  Diabetes mellitus: Stable. Continue to lose weight. Continue routine followup.   - Plan: metFORMIN (GLUCOPHAGE) 1000 MG tablet, Hemoglobin A1c, Basic metabolic panel  Dyslipidemia :  - Plan: Lipid panel  CAD, mild, 30%  RCA in 2005: Denies angina  Anemia,  hgb 11.7 in March 2014  Plan: CBC With differential/Platelet  Pacemaker-Medtronic: Functioning well at this time.

## 2013-05-23 NOTE — Patient Instructions (Signed)
Continue current medications. 

## 2013-05-28 ENCOUNTER — Other Ambulatory Visit: Payer: Self-pay

## 2013-05-28 NOTE — Telephone Encounter (Signed)
Patient has not been seen since 02/25/2012. She is now seen by Dr. Lewayne Bunting at [facility]. Rx request denied, sent to pharmacy electronically.

## 2013-05-28 NOTE — Telephone Encounter (Signed)
OK to try the Metformin extended release 500 mg 2 tablet each morning.

## 2013-05-31 ENCOUNTER — Other Ambulatory Visit: Payer: Self-pay | Admitting: Geriatric Medicine

## 2013-05-31 MED ORDER — METFORMIN HCL ER 500 MG PO TB24: ORAL_TABLET | ORAL | Status: DC

## 2013-05-31 NOTE — Telephone Encounter (Signed)
Patient aware and prescription has been sent to the pharmacy.

## 2013-06-01 ENCOUNTER — Other Ambulatory Visit: Payer: Self-pay | Admitting: Geriatric Medicine

## 2013-06-05 ENCOUNTER — Other Ambulatory Visit: Payer: Self-pay | Admitting: *Deleted

## 2013-06-05 MED ORDER — ATORVASTATIN CALCIUM 40 MG PO TABS: 20.0000 mg | ORAL_TABLET | Freq: Every day | ORAL | Status: DC

## 2013-06-12 ENCOUNTER — Encounter: Payer: Medicare Other | Admitting: *Deleted

## 2013-06-20 ENCOUNTER — Encounter: Payer: Self-pay | Admitting: *Deleted

## 2013-06-25 ENCOUNTER — Other Ambulatory Visit: Payer: Self-pay | Admitting: *Deleted

## 2013-06-25 DIAGNOSIS — I10 Essential (primary) hypertension: Secondary | ICD-10-CM

## 2013-06-25 MED ORDER — DULOXETINE HCL 20 MG PO CPEP: ORAL_CAPSULE | ORAL | Status: DC

## 2013-06-25 MED ORDER — ALLOPURINOL 300 MG PO TABS: 300.0000 mg | ORAL_TABLET | Freq: Every day | ORAL | Status: DC

## 2013-06-25 MED ORDER — FUROSEMIDE 20 MG PO TABS: ORAL_TABLET | ORAL | Status: DC

## 2013-06-29 ENCOUNTER — Ambulatory Visit (INDEPENDENT_AMBULATORY_CARE_PROVIDER_SITE_OTHER): Payer: Medicare Other | Admitting: *Deleted

## 2013-06-29 DIAGNOSIS — R001 Bradycardia, unspecified: Secondary | ICD-10-CM

## 2013-06-29 DIAGNOSIS — I498 Other specified cardiac arrhythmias: Secondary | ICD-10-CM

## 2013-06-29 DIAGNOSIS — Z950 Presence of cardiac pacemaker: Secondary | ICD-10-CM

## 2013-07-07 LAB — REMOTE PACEMAKER DEVICE
AL AMPLITUDE: 2.5 mv
ATRIAL PACING PM: 97.21
VENTRICULAR PACING PM: 0.12

## 2013-07-10 ENCOUNTER — Encounter: Payer: Self-pay | Admitting: *Deleted

## 2013-07-14 ENCOUNTER — Encounter: Payer: Self-pay | Admitting: Internal Medicine

## 2013-08-13 ENCOUNTER — Other Ambulatory Visit: Payer: Medicare Other

## 2013-08-15 ENCOUNTER — Ambulatory Visit: Payer: Medicare Other | Admitting: Internal Medicine

## 2013-08-16 ENCOUNTER — Other Ambulatory Visit: Payer: Self-pay

## 2013-08-17 ENCOUNTER — Ambulatory Visit (INDEPENDENT_AMBULATORY_CARE_PROVIDER_SITE_OTHER): Payer: Medicare Other | Admitting: Cardiology

## 2013-08-17 ENCOUNTER — Ambulatory Visit (INDEPENDENT_AMBULATORY_CARE_PROVIDER_SITE_OTHER): Payer: Medicare Other | Admitting: *Deleted

## 2013-08-17 ENCOUNTER — Encounter: Payer: Self-pay | Admitting: Cardiology

## 2013-08-17 ENCOUNTER — Telehealth: Payer: Self-pay | Admitting: Internal Medicine

## 2013-08-17 VITALS — BP 121/69 | HR 74 | Ht 63.5 in | Wt 154.8 lb

## 2013-08-17 DIAGNOSIS — R0609 Other forms of dyspnea: Secondary | ICD-10-CM

## 2013-08-17 DIAGNOSIS — Z950 Presence of cardiac pacemaker: Secondary | ICD-10-CM

## 2013-08-17 DIAGNOSIS — R0600 Dyspnea, unspecified: Secondary | ICD-10-CM

## 2013-08-17 DIAGNOSIS — I2510 Atherosclerotic heart disease of native coronary artery without angina pectoris: Secondary | ICD-10-CM

## 2013-08-17 DIAGNOSIS — I10 Essential (primary) hypertension: Secondary | ICD-10-CM

## 2013-08-17 DIAGNOSIS — E785 Hyperlipidemia, unspecified: Secondary | ICD-10-CM

## 2013-08-17 DIAGNOSIS — R42 Dizziness and giddiness: Secondary | ICD-10-CM

## 2013-08-17 LAB — BASIC METABOLIC PANEL
CO2: 26 mEq/L (ref 19–32)
GFR: 44.69 mL/min — ABNORMAL LOW (ref >60.00)
Glucose, Bld: 197 mg/dL — ABNORMAL HIGH (ref 70–99)
Potassium: 4.9 mEq/L (ref 3.5–5.1)
Sodium: 135 mEq/L (ref 135–145)

## 2013-08-17 MED ORDER — LISINOPRIL 10 MG PO TABS: 10.0000 mg | ORAL_TABLET | Freq: Every day | ORAL | Status: DC

## 2013-08-17 NOTE — Assessment & Plan Note (Signed)
Decrease lisinopril from 20 to 10 mg daily as described under dizziness.

## 2013-08-17 NOTE — Telephone Encounter (Signed)
New message  Patients daughter called patient was SOB, BP was going high/low, I sent patients daughter to speak with Synetta Fail in Triage.

## 2013-08-17 NOTE — Assessment & Plan Note (Signed)
Patient not volume overloaded on examination. Check BNP. Check echocardiogram for LV function.

## 2013-08-17 NOTE — Patient Instructions (Signed)
Your physician recommends that you schedule a follow-up appointment in: 2-4 WEEKS WITH DR Ladona Ridgel  Your physician recommends that you HAVE LAB WORK TODAY  DECREASE LISINOPRIL TO 10 MG ONCE DAILY  Your physician has requested that you have an echocardiogram. Echocardiography is a painless test that uses sound waves to create images of your heart. It provides your doctor with information about the size and shape of your heart and how well your heart's chambers and valves are working. This procedure takes approximately one hour. There are no restrictions for this procedure.

## 2013-08-17 NOTE — Assessment & Plan Note (Signed)
Continue statin. 

## 2013-08-17 NOTE — Telephone Encounter (Signed)
Daughter called stating Ms. Rizzi is very SOB, dizzy, ;light headed. Daughter works at [employer] (mother visiting her) and she had a nurse at [facility] take her BP.  Sitting was 120/60; standing was 90/60. O2 sats upper 80's-to low 90's.  Daughter feels like she should be seen.  Asked her if she had checked her BS and daughter states she had not.  Spoke w/Dr. Jens Som (DOD) who states he will see her.  Advised daughter to bring her on to office and will be seen about 2:15.

## 2013-08-17 NOTE — Progress Notes (Signed)
HPI: 79 year old female followed by Dr. Ladona Ridgel seen as an add on for dyspnea and dizziness. Patient has had previous pacemaker for symptomatic bradycardia. Patient admitted in March of 2013 with chest pain. Echocardiogram in March of 2013 showed normal LV function, grade 1 diastolic dysfunction, mild aortic stenosis with mean gradient 14 mm of mercury, mild left atrial enlargement and mild mitral regurgitation. Cardiac catheterization revealed no coronary disease. LV function normal. Patient has chronic dyspnea on exertionbut no orthopnea, PND, pedal edema, exertional chest pain or syncope. Today she felt increased shortness of breath with activities. She also has had worsening dizziness with standing. She was therefore added to our schedule. Note last week she had an upper respiratory infection and increased diarrhea.  Current Outpatient Prescriptions  Medication Sig Dispense Refill  . allopurinol (ZYLOPRIM) 300 MG tablet Take 1 tablet (300 mg total) by mouth daily.  30 tablet  3  . aspirin 81 MG tablet Take 81 mg by mouth daily. Take 1 tablet once daily to prevent  heart attack, stroke and blood clot.      Marland Kitchen atorvastatin (LIPITOR) 40 MG tablet Take 0.5 tablets (20 mg total) by mouth daily at 6 PM.  30 tablet  5  . DULoxetine (CYMBALTA) 20 MG capsule Take 1 tablet daily to help treat anxiety and nerves.  30 capsule  5  . furosemide (LASIX) 20 MG tablet Take one tablet by mouth once daily as needed for diuretic  30 tablet  3  . HYDROcodone-acetaminophen (NORCO/VICODIN) 5-325 MG per tablet Take 1 tablet by mouth every 6 (six) hours as needed for pain. Take 1 tablet up to 4 times daily to control pain.      Marland Kitchen lisinopril (PRINIVIL,ZESTRIL) 40 MG tablet Take 40 mg by mouth daily. Take 1/2 tablet daily for blood pressure.      . metFORMIN (GLUCOPHAGE-XR) 500 MG 24 hr tablet Take two tablets by mouth each morning.  60 tablet  3   No current facility-administered medications for this visit.      Past Medical History  Diagnosis Date  . Diabetes mellitus   . Hypertension   . Gout   . Osteoarthritis   . Bursitis   . S/P cardiac pacemaker procedure, PPM Medtronic REVO placed 01/11/2012 01/11/2012  . Personal history of fall   . Chronic kidney disease, stage II (mild)   . Memory loss   . Anxiety state, unspecified   . Other and unspecified hyperlipidemia   . Anemia, unspecified   . Abnormality of gait   . Shortness of breath   . Depressive disorder, not elsewhere classified   . Rheumatism, unspecified and fibrositis   . Lumbago   . Other nonspecific abnormal serum enzyme levels   . Nonspecific abnormal results of liver function study   . Obesity, unspecified   . Internal hemorrhoids without mention of complication   . Viremia, unspecified     Past Surgical History  Procedure Laterality Date  . Extremity cyst excision      History   Social History  . Marital Status: Widowed    Spouse Name: N/A    Number of Children: N/A  . Years of Education: N/A   Occupational History  . Not on file.   Social History Main Topics  . Smoking status: Never Smoker   . Smokeless tobacco: Not on file  . Alcohol Use: No  . Drug Use: No  . Sexual Activity: Not on file   Other Topics Concern  .  Not on file   Social History Narrative  . No narrative on file    ROS: no fevers or chills, productive cough, hemoptysis, dysphasia, odynophagia, melena, hematochezia, dysuria, hematuria, rash, seizure activity, orthopnea, PND, pedal edema, claudication. Remaining systems are negative.  Physical Exam: Well-developed well-nourished in no acute distress.  Skin is warm and dry.  HEENT is normal.  Neck is supple.  Chest is clear to auscultation with normal expansion.  Cardiovascular exam is regular rate and rhythm.  Abdominal exam nontender or distended. No masses palpated. Extremities show no edema. neuro grossly intact  ECG atrial paced rhythm, right bundle branch block, left  ventricular hypertrophy, left axis deviation.

## 2013-08-17 NOTE — Assessment & Plan Note (Signed)
Patient describes orthostatic symptoms. These appear to be chronic. Orthostatic vitals were checked and her systolic was 120 sitting and 90 standing. She has increasing edema and dyspnea when she misses her Lasix. I will continue that dose at present and adjust based on BUN and creatinine. Increase lisinopril from 20 mg to 10 mg daily. Hopefully allowing her blood pressure to run higher will improve her symptoms.

## 2013-08-17 NOTE — Assessment & Plan Note (Signed)
Plan repeat echocardiogram to reassess. 

## 2013-08-17 NOTE — Assessment & Plan Note (Signed)
We will have her pacemaker interrogated. Followup Dr. Ladona Ridgel.

## 2013-08-21 ENCOUNTER — Encounter: Payer: Self-pay | Admitting: Internal Medicine

## 2013-08-21 LAB — MDC_IDC_ENUM_SESS_TYPE_INCLINIC
Brady Statistic AP VS Percent: 96.72 %
Brady Statistic AS VS Percent: 3.15 %
Date Time Interrogation Session: 20141107210212
Lead Channel Impedance Value: 528 Ohm
Lead Channel Pacing Threshold Amplitude: 1 V
Lead Channel Pacing Threshold Pulse Width: 0.4 ms
Lead Channel Pacing Threshold Pulse Width: 0.4 ms
Lead Channel Sensing Intrinsic Amplitude: 2.775
Lead Channel Setting Sensing Sensitivity: 0.9 mV
Zone Setting Detection Interval: 350 ms
Zone Setting Detection Interval: 400 ms

## 2013-08-21 NOTE — Progress Notes (Signed)
Check per Dr. Jens Som. All functions & diagnostics normal. 3 NSVT---2 already seen on [facility]; remaining was SVT. No changes made, full details in PaceArt.  ROV w/ Dr. Ladona Ridgel 09/11/13 @ 4:00.

## 2013-08-30 ENCOUNTER — Ambulatory Visit (HOSPITAL_COMMUNITY): Payer: Medicare Other | Attending: Cardiology | Admitting: Radiology

## 2013-08-30 DIAGNOSIS — I10 Essential (primary) hypertension: Secondary | ICD-10-CM | POA: Insufficient documentation

## 2013-08-30 DIAGNOSIS — I359 Nonrheumatic aortic valve disorder, unspecified: Secondary | ICD-10-CM | POA: Insufficient documentation

## 2013-08-30 DIAGNOSIS — E785 Hyperlipidemia, unspecified: Secondary | ICD-10-CM | POA: Insufficient documentation

## 2013-08-30 DIAGNOSIS — E119 Type 2 diabetes mellitus without complications: Secondary | ICD-10-CM | POA: Insufficient documentation

## 2013-08-30 DIAGNOSIS — I2510 Atherosclerotic heart disease of native coronary artery without angina pectoris: Secondary | ICD-10-CM | POA: Insufficient documentation

## 2013-08-30 DIAGNOSIS — I079 Rheumatic tricuspid valve disease, unspecified: Secondary | ICD-10-CM | POA: Insufficient documentation

## 2013-08-30 DIAGNOSIS — R0600 Dyspnea, unspecified: Secondary | ICD-10-CM

## 2013-08-30 DIAGNOSIS — R0602 Shortness of breath: Secondary | ICD-10-CM | POA: Insufficient documentation

## 2013-08-30 DIAGNOSIS — R42 Dizziness and giddiness: Secondary | ICD-10-CM | POA: Insufficient documentation

## 2013-08-30 NOTE — Progress Notes (Signed)
Echocardiogram performed.  

## 2013-09-11 ENCOUNTER — Encounter: Payer: Self-pay | Admitting: Internal Medicine

## 2013-09-11 ENCOUNTER — Ambulatory Visit (INDEPENDENT_AMBULATORY_CARE_PROVIDER_SITE_OTHER): Payer: Medicare Other | Admitting: Internal Medicine

## 2013-09-11 VITALS — BP 154/72 | HR 74 | Ht 63.5 in | Wt 158.0 lb

## 2013-09-11 DIAGNOSIS — Z950 Presence of cardiac pacemaker: Secondary | ICD-10-CM

## 2013-09-11 DIAGNOSIS — I498 Other specified cardiac arrhythmias: Secondary | ICD-10-CM

## 2013-09-11 DIAGNOSIS — I359 Nonrheumatic aortic valve disorder, unspecified: Secondary | ICD-10-CM

## 2013-09-11 DIAGNOSIS — R001 Bradycardia, unspecified: Secondary | ICD-10-CM

## 2013-09-11 DIAGNOSIS — R0602 Shortness of breath: Secondary | ICD-10-CM

## 2013-09-11 DIAGNOSIS — I350 Nonrheumatic aortic (valve) stenosis: Secondary | ICD-10-CM

## 2013-09-11 LAB — MDC_IDC_ENUM_SESS_TYPE_INCLINIC
Battery Voltage: 3 V
Brady Statistic AP VP Percent: 0.23 %
Brady Statistic RA Percent Paced: 95.23 %
Brady Statistic RV Percent Paced: 0.23 %
Date Time Interrogation Session: 20141202170905
Lead Channel Impedance Value: 408 Ohm
Lead Channel Pacing Threshold Amplitude: 1 V
Lead Channel Pacing Threshold Pulse Width: 0.4 ms
Lead Channel Sensing Intrinsic Amplitude: 2.0159
Lead Channel Sensing Intrinsic Amplitude: 2.4282
Lead Channel Setting Pacing Amplitude: 2 V
Lead Channel Setting Pacing Amplitude: 2.5 V
Lead Channel Setting Pacing Pulse Width: 0.4 ms

## 2013-09-11 MED ORDER — POTASSIUM CHLORIDE ER 10 MEQ PO TBCR: 10.0000 meq | EXTENDED_RELEASE_TABLET | Freq: Every day | ORAL | Status: DC

## 2013-09-11 NOTE — Patient Instructions (Addendum)
Remote monitoring is used to monitor your pacemaker from home. This monitoring reduces the number of office visits required to check your device to one time per year. It allows [place] to keep an eye on the functioning of your device to ensure it is working properly. You are scheduled for a device check from home on 12-13-2013. You may send your transmission at any time that day. If you have a wireless device, the transmission will be sent automatically. After your physician reviews your transmission, you will receive a postcard with your next transmission date.  Your physician recommends that you schedule a follow-up appointment in: 12 months  Your physician has recommended you make the following change in your medication:  1) Start Potassium daily

## 2013-09-12 ENCOUNTER — Encounter: Payer: Self-pay | Admitting: Internal Medicine

## 2013-09-12 NOTE — Assessment & Plan Note (Signed)
Still mild by echo with preserved LV function. Will follow

## 2013-09-12 NOTE — Progress Notes (Signed)
HPI Danielle Benitez returns today for followup. She is a very pleasant elderly woman with a history of symptomatic bradycardia, status post permanent pacemaker insertion. She notes fatigue and weakness with activity. She denies syncope. She has minimal peripheral edema. She denies chest pain or shortness of breath. She was seen by Dr. Jens Som several weeks ago and had increased sob and dizziness. She had a 2D echo which demonstrated no progression of her very mild aortic stenosis. She had her dose of lisinopril reduced but this was increased by the patient after her blood pressure began to be elevated. She takes lasix as needed but notes that when she takes too much lasix she will become dizzy and develop orthostatic sysmptoms. Allergies  Allergen Reactions  . Beta Adrenergic Blockers   . Fluzone [Flu Virus Vaccine] Other (See Comments)    Feeling unwell for a week. Cold symptoms/flu symtoms  . Tetanus Toxoids      Current Outpatient Prescriptions  Medication Sig Dispense Refill  . furosemide (LASIX) 20 MG tablet Take 20 mg by mouth every other day. Take one tablet by mouth once daily as needed for diuretic      . lisinopril (PRINIVIL,ZESTRIL) 20 MG tablet Take 20 mg by mouth daily.      Marland Kitchen allopurinol (ZYLOPRIM) 300 MG tablet Take 1 tablet (300 mg total) by mouth daily.  30 tablet  3  . aspirin 81 MG tablet Take 81 mg by mouth daily. Take 1 tablet once daily to prevent  heart attack, stroke and blood clot.      Marland Kitchen atorvastatin (LIPITOR) 40 MG tablet Take 0.5 tablets (20 mg total) by mouth daily at 6 PM.  30 tablet  5  . DULoxetine (CYMBALTA) 20 MG capsule Take 1 tablet daily to help treat anxiety and nerves.  30 capsule  5  . HYDROcodone-acetaminophen (NORCO/VICODIN) 5-325 MG per tablet Take 1 tablet by mouth every 6 (six) hours as needed for pain. Take 1 tablet up to 4 times daily to control pain.      . metFORMIN (GLUCOPHAGE-XR) 500 MG 24 hr tablet Take two tablets by mouth each morning.  60 tablet   3  . potassium chloride (K-DUR) 10 MEQ tablet Take 1 tablet (10 mEq total) by mouth daily.  30 tablet  11   No current facility-administered medications for this visit.     Past Medical History  Diagnosis Date  . Diabetes mellitus   . Hypertension   . Gout   . Osteoarthritis   . Bursitis   . S/P cardiac pacemaker procedure, PPM Medtronic REVO placed 01/11/2012 01/11/2012  . Personal history of fall   . Chronic kidney disease, stage II (mild)   . Memory loss   . Anxiety state, unspecified   . Other and unspecified hyperlipidemia   . Anemia, unspecified   . Abnormality of gait   . Shortness of breath   . Depressive disorder, not elsewhere classified   . Rheumatism, unspecified and fibrositis   . Lumbago   . Other nonspecific abnormal serum enzyme levels   . Nonspecific abnormal results of liver function study   . Obesity, unspecified   . Internal hemorrhoids without mention of complication   . Viremia, unspecified     ROS:   All systems reviewed and negative except as noted in the HPI.   Past Surgical History  Procedure Laterality Date  . Extremity cyst excision       Family History  Problem Relation Age of Onset  .  Breast cancer Mother   . Cancer Mother   . Diabetes Daughter   . Hypertension Daughter   . Cancer Brother      History   Social History  . Marital Status: Widowed    Spouse Name: N/A    Number of Children: N/A  . Years of Education: N/A   Occupational History  . Not on file.   Social History Main Topics  . Smoking status: Never Smoker   . Smokeless tobacco: Not on file  . Alcohol Use: No  . Drug Use: No  . Sexual Activity: Not on file   Other Topics Concern  . Not on file   Social History Narrative  . No narrative on file     BP 154/72  Pulse 74  Ht 5' 3.5" (1.613 m)  Wt 158 lb (71.668 kg)  BMI 27.55 kg/m2  Physical Exam:  Well appearing elderly woman,NAD HEENT: Unremarkable Neck:  7 cm JVD, no thyromegally Lungs:  Clear  with no wheezes, rales, or rhonchi. Well-healed pacemaker incision. HEART:  Regular rate rhythm, no murmurs, no rubs, no clicks Abd:  soft, positive bowel sounds, no organomegally, no rebound, no guarding Ext:  2 plus pulses, no edema, no cyanosis, no clubbing Skin:  No rashes no nodules Neuro:  CN II through XII intact, motor grossly intact   DEVICE  Normal device function.  See PaceArt for details.   Assess/Plan:

## 2013-09-12 NOTE — Assessment & Plan Note (Signed)
Her Medtronic DDD PM is working normally. Will follow.

## 2013-09-12 NOTE — Assessment & Plan Note (Signed)
Her symptoms are improved. She will take her lasix as needed. I have asked her to follow her weight carefully. If her weight is down or if she is dizzy, she will hold her lasix. If her weight is up then she will continue lasix.

## 2013-10-02 ENCOUNTER — Ambulatory Visit: Payer: Medicare Other | Admitting: Internal Medicine

## 2013-11-05 ENCOUNTER — Other Ambulatory Visit: Payer: Self-pay | Admitting: *Deleted

## 2013-11-05 MED ORDER — ALLOPURINOL 300 MG PO TABS: 300.0000 mg | ORAL_TABLET | Freq: Every day | ORAL | Status: DC

## 2013-11-16 ENCOUNTER — Other Ambulatory Visit: Payer: Self-pay | Admitting: *Deleted

## 2013-11-16 ENCOUNTER — Other Ambulatory Visit: Payer: Medicare Other

## 2013-11-16 ENCOUNTER — Encounter: Payer: Self-pay | Admitting: *Deleted

## 2013-11-16 DIAGNOSIS — D649 Anemia, unspecified: Secondary | ICD-10-CM

## 2013-11-16 DIAGNOSIS — E119 Type 2 diabetes mellitus without complications: Secondary | ICD-10-CM

## 2013-11-16 DIAGNOSIS — E785 Hyperlipidemia, unspecified: Secondary | ICD-10-CM

## 2013-11-17 LAB — CBC WITH DIFFERENTIAL
Basophils Absolute: 0 10*3/uL (ref 0.0–0.2)
Basos: 1 %
EOS: 4 %
Eosinophils Absolute: 0.2 10*3/uL (ref 0.0–0.4)
HCT: 34.9 % (ref 34.0–46.6)
Hemoglobin: 11.3 g/dL (ref 11.1–15.9)
IMMATURE GRANULOCYTES: 0 %
Immature Grans (Abs): 0 10*3/uL (ref 0.0–0.1)
Lymphocytes Absolute: 2.4 10*3/uL (ref 0.7–3.1)
Lymphs: 40 %
MCH: 29.4 pg (ref 26.6–33.0)
MCHC: 32.4 g/dL (ref 31.5–35.7)
MCV: 91 fL (ref 79–97)
Monocytes Absolute: 0.3 10*3/uL (ref 0.1–0.9)
Monocytes: 4 %
Neutrophils Absolute: 3.1 10*3/uL (ref 1.4–7.0)
Neutrophils Relative %: 51 %
PLATELETS: 170 10*3/uL (ref 150–379)
RBC: 3.85 x10E6/uL (ref 3.77–5.28)
RDW: 16.1 % — AB (ref 12.3–15.4)
WBC: 6 10*3/uL (ref 3.4–10.8)

## 2013-11-17 LAB — BASIC METABOLIC PANEL
BUN/Creatinine Ratio: 21 (ref 11–26)
BUN: 20 mg/dL (ref 8–27)
CHLORIDE: 106 mmol/L (ref 97–108)
CO2: 24 mmol/L (ref 18–29)
Calcium: 10 mg/dL (ref 8.7–10.3)
Creatinine, Ser: 0.97 mg/dL (ref 0.57–1.00)
GFR calc non Af Amer: 56 mL/min/{1.73_m2} — ABNORMAL LOW (ref >59)
GFR, EST AFRICAN AMERICAN: 64 mL/min/{1.73_m2} (ref >59)
GLUCOSE: 129 mg/dL — AB (ref 65–99)
Potassium: 6.3 mmol/L — ABNORMAL HIGH (ref 3.5–5.2)
Sodium: 142 mmol/L (ref 134–144)

## 2013-11-17 LAB — LIPID PANEL
CHOLESTEROL TOTAL: 123 mg/dL (ref 100–199)
Chol/HDL Ratio: 2.4 ratio units (ref 0.0–4.4)
HDL: 52 mg/dL (ref >39)
LDL CALC: 49 mg/dL (ref 0–99)
Triglycerides: 110 mg/dL (ref 0–149)
VLDL Cholesterol Cal: 22 mg/dL (ref 5–40)

## 2013-11-17 LAB — HEMOGLOBIN A1C
ESTIMATED AVERAGE GLUCOSE: 192 mg/dL
Hgb A1c MFr Bld: 8.3 % — ABNORMAL HIGH (ref 4.8–5.6)

## 2013-11-20 ENCOUNTER — Encounter: Payer: Self-pay | Admitting: Internal Medicine

## 2013-11-20 ENCOUNTER — Ambulatory Visit (INDEPENDENT_AMBULATORY_CARE_PROVIDER_SITE_OTHER): Payer: Medicare Other | Admitting: Internal Medicine

## 2013-11-20 VITALS — BP 126/70 | HR 71 | Temp 96.2°F | Wt 157.8 lb

## 2013-11-20 DIAGNOSIS — D649 Anemia, unspecified: Secondary | ICD-10-CM

## 2013-11-20 DIAGNOSIS — M25519 Pain in unspecified shoulder: Secondary | ICD-10-CM

## 2013-11-20 DIAGNOSIS — L259 Unspecified contact dermatitis, unspecified cause: Secondary | ICD-10-CM

## 2013-11-20 DIAGNOSIS — L309 Dermatitis, unspecified: Secondary | ICD-10-CM

## 2013-11-20 DIAGNOSIS — L299 Pruritus, unspecified: Secondary | ICD-10-CM

## 2013-11-20 DIAGNOSIS — E785 Hyperlipidemia, unspecified: Secondary | ICD-10-CM

## 2013-11-20 DIAGNOSIS — I10 Essential (primary) hypertension: Secondary | ICD-10-CM

## 2013-11-20 DIAGNOSIS — E119 Type 2 diabetes mellitus without complications: Secondary | ICD-10-CM

## 2013-11-20 DIAGNOSIS — Z23 Encounter for immunization: Secondary | ICD-10-CM

## 2013-11-20 MED ORDER — TRIAMCINOLONE ACETONIDE 0.1 % EX CREA
TOPICAL_CREAM | CUTANEOUS | Status: DC
Start: 2013-11-20 — End: 2015-07-17

## 2013-11-20 NOTE — Patient Instructions (Signed)
Continue current medications. 

## 2013-11-22 ENCOUNTER — Encounter: Payer: Self-pay | Admitting: Internal Medicine

## 2013-11-22 DIAGNOSIS — M25519 Pain in unspecified shoulder: Secondary | ICD-10-CM

## 2013-11-22 DIAGNOSIS — L309 Dermatitis, unspecified: Secondary | ICD-10-CM

## 2013-11-22 DIAGNOSIS — L299 Pruritus, unspecified: Secondary | ICD-10-CM

## 2013-11-22 HISTORY — DX: Pruritus, unspecified: L29.9

## 2013-11-22 HISTORY — DX: Pain in unspecified shoulder: M25.519

## 2013-11-22 HISTORY — DX: Dermatitis, unspecified: L30.9

## 2013-11-22 NOTE — Progress Notes (Signed)
Patient ID: Danielle Benitez, female   DOB: October 05, 1932, 80 y.o.   MRN: [id number]    Location:    [facility]  Place of Service:  OFFICE    Allergies  Allergen Reactions  . Beta Adrenergic Blockers   . Fluzone [Flu Virus Vaccine] Other (See Comments)    Feeling unwell for a week. Cold symptoms/flu symtoms  . Tetanus Toxoids     Chief Complaint  Patient presents with  . Medical Managment of Chronic Issues    3 month f/u and discuss labs. (printed)  . other    depression & fall screening done.  . Immunizations    never had Dexa and the Pneumo & Tdap vaccines  . other    pt having LT arn/shoulder pain,  HA's often, and feeling very tired lately and not sleeping very well at night    HPI:  Diabetes mellitus : poor control; rising A1c. Not compliant with diet  Anemia, hgb 9.7; resolved  Dyslipidemia -controlled  Hypertension: controlled  Pruritus: generalized  Need for prophylactic vaccination with combined diphtheria-tetanus-pertussis (DTP) vaccine  Pain in the left arm and shoulder is chronic. No recent fall or injury.   Medications: Patient's Medications  New Prescriptions   TRIAMCINOLONE CREAM (KENALOG) 0.1 %    Apply sparingly twice daily to areas of itching  Previous Medications   ALLOPURINOL (ZYLOPRIM) 300 MG TABLET    Take 1 tablet (300 mg total) by mouth daily.   ASPIRIN 81 MG TABLET    Take 81 mg by mouth daily. Take 1 tablet once daily to prevent  heart attack, stroke and blood clot.   ATORVASTATIN (LIPITOR) 40 MG TABLET    Take 0.5 tablets (20 mg total) by mouth daily at 6 PM.   DULOXETINE (CYMBALTA) 20 MG CAPSULE    Take 1 tablet daily to help treat anxiety and nerves.   FUROSEMIDE (LASIX) 20 MG TABLET    Take 20 mg by mouth every other day. Take one tablet by mouth once daily as needed for diuretic   HYDROCODONE-ACETAMINOPHEN (NORCO/VICODIN) 5-325 MG PER TABLET    Take 1 tablet by mouth every 6 (six) hours as needed for pain. Take 1 tablet up to 4 times daily to  control pain.   LISINOPRIL (PRINIVIL,ZESTRIL) 20 MG TABLET    Take 20 mg by mouth daily.   METFORMIN (GLUCOPHAGE-XR) 500 MG 24 HR TABLET    Take two tablets by mouth each morning.   POTASSIUM CHLORIDE (K-DUR) 10 MEQ TABLET    Take 1 tablet (10 mEq total) by mouth daily.  Modified Medications   No medications on file  Discontinued Medications   POTASSIUM CHLORIDE (K-DUR,KLOR-CON) 10 MEQ TABLET         Review of Systems  Constitutional: Negative.   HENT: Negative.   Eyes:       Bilateral cataracts  Respiratory: Negative.   Cardiovascular: Positive for leg swelling. Negative for chest pain and palpitations.  Gastrointestinal: Negative.   Endocrine:       Diabetic  Genitourinary: Negative.   Musculoskeletal: Positive for back pain.  Skin: Negative.   Allergic/Immunologic:       History of allergic rhinitis  Neurological:       Occasional headaches. She is aware that she has some mild memory loss.  Hematological: Negative.   Psychiatric/Behavioral:       Depressive symptoms. Processes and worries about members of her family and her health.    Filed Vitals:   11/20/13 1615  BP:  126/70  Pulse: 71  Temp: 96.2 F (35.7 C)  TempSrc: Oral  Weight: 157 lb 12.8 oz (71.578 kg)  SpO2: 97%   Physical Exam  Constitutional: She is oriented to person, place, and time.  Morbidly obese  HENT:  Head: Normocephalic and atraumatic.  Right Ear: External ear normal.  Left Ear: External ear normal.  Nose: Nose normal.  Eyes:  Bilateral cataracts  Neck: Normal range of motion. Neck supple. No JVD present. No tracheal deviation present. No thyromegaly present.  Cardiovascular: Normal rate, regular rhythm, normal heart sounds and intact distal pulses.  Exam reveals no gallop and no friction rub.   No murmur heard. Pulmonary/Chest: Effort normal. No respiratory distress. She has no wheezes. She has rales. She exhibits no tenderness.  Pacemaker left upper chest wall  Abdominal: Soft. Bowel  sounds are normal. She exhibits no distension and no mass.  Musculoskeletal: She exhibits edema.  Pain in the left shoulder with abduction and with rotation  Lymphadenopathy:    She has no cervical adenopathy.  Neurological: She is alert and oriented to person, place, and time. She has normal reflexes. No cranial nerve deficit. Coordination normal.  Skin: No rash noted. No erythema. No pallor.  Eczematous lesions on arms  Psychiatric: She has a normal mood and affect. Her behavior is normal. Thought content normal.     Labs reviewed: Appointment on 11/16/2013  Component Date Value Ref Range Status  . WBC 11/16/2013 6.0  3.4 - 10.8 x10E3/uL Final  . RBC 11/16/2013 3.85  3.77 - 5.28 x10E6/uL Final  . Hemoglobin 11/16/2013 11.3  11.1 - 15.9 g/dL Final  . HCT 30/86/578402/03/2014 34.9  34.0 - 46.6 % Final  . MCV 11/16/2013 91  79 - 97 fL Final  . MCH 11/16/2013 29.4  26.6 - 33.0 pg Final  . MCHC 11/16/2013 32.4  31.5 - 35.7 g/dL Final  . RDW 69/62/952802/03/2014 16.1 12.3 - 15.4 % Final  . Platelets 11/16/2013 170  150 - 379 x10E3/uL Final  . Neutrophils Relative % 11/16/2013 51   Final  . Lymphs 11/16/2013 40   Final  . Monocytes 11/16/2013 4   Final  . Eos 11/16/2013 4   Final  . Basos 11/16/2013 1   Final  . Neutrophils Absolute 11/16/2013 3.1  1.4 - 7.0 x10E3/uL Final  . Lymphocytes Absolute 11/16/2013 2.4  0.7 - 3.1 x10E3/uL Final  . Monocytes Absolute 11/16/2013 0.3  0.1 - 0.9 x10E3/uL Final  . Eosinophils Absolute 11/16/2013 0.2  0.0 - 0.4 x10E3/uL Final  . Basophils Absolute 11/16/2013 0.0  0.0 - 0.2 x10E3/uL Final  . Immature Granulocytes 11/16/2013 0   Final  . Immature Grans (Abs) 11/16/2013 0.0  0.0 - 0.1 x10E3/uL Final  . Hemoglobin A1C 11/16/2013 8.3 4.8 - 5.6 % Final   Comment:          Increased risk for diabetes: 5.7 - 6.4                                   Diabetes: >6.4                                   Glycemic control for adults with diabetes: <7.0  . Estimated average glucose  11/16/2013 192   Final  . Cholesterol, Total 11/16/2013 123  100 - 199 mg/dL Final  .  Triglycerides 11/16/2013 110  0 - 149 mg/dL Final  . HDL 16/07/9603 52  >39 mg/dL Final   Comment: According to ATP-III Guidelines, HDL-C >59 mg/dL is considered a                          negative risk factor for CHD.  Marland Kitchen VLDL Cholesterol Cal 11/16/2013 22  5 - 40 mg/dL Final  . LDL Calculated 11/16/2013 49  0 - 99 mg/dL Final  . Chol/HDL Ratio 11/16/2013 2.4  0.0 - 4.4 ratio units Final   Comment:                                   T. Chol/HDL Ratio                                                                      Men  Women                                                        1/2 Avg.Risk  3.4    3.3                                                            Avg.Risk  5.0    4.4                                                         2X Avg.Risk  9.6    7.1                                                         3X Avg.Risk 23.4   11.0  . Glucose 11/16/2013 129 65 - 99 mg/dL Final  . BUN 54/06/8118 20  8 - 27 mg/dL Final  . Creatinine, Ser 11/16/2013 0.97  0.57 - 1.00 mg/dL Final  . GFR calc non Af Amer 11/16/2013 56 >59 mL/min/1.73 Final  . GFR calc Af Amer 11/16/2013 64  >59 mL/min/1.73 Final  . BUN/Creatinine Ratio 11/16/2013 21  11 - 26 Final  . Sodium 11/16/2013 142  134 - 144 mmol/L Final  . Potassium 11/16/2013 6.3 3.5 - 5.2 mmol/L Final  . Chloride 11/16/2013 106  97 - 108 mmol/L Final  . CO2 11/16/2013 24  18 - 29 mmol/L Final  . Calcium 11/16/2013 10.0  8.7 - 10.3 mg/dL Final       Assessment/Plan  1. Anemia, hgb 9.7  resolved  2. Diabetes mellitus Encouraged dietary compliance. No change in medication. - Hemoglobin A1c; Future - CMP; Future - Microalbumin/Creatinine Ratio, Urine; Future  3. Dyslipidemia Continue current med - Lipid panel; Future  4. Hypertension controlled  5. Pruritus generalized and some localized eczematous lesions - triamcinolone cream (KENALOG)  0.1 %; Apply sparingly twice daily to areas of itching  Dispense: 30 g; Refill: 3  6. Need for prophylactic vaccination with combined diphtheria-tetanus-pertussis (DTP) vaccine Given Rx.  7. Pain in joint, shoulder region Chronic. Does not care for ortho referral at this time  8. Eczema TCA cream

## 2013-12-04 ENCOUNTER — Other Ambulatory Visit: Payer: Self-pay | Admitting: *Deleted

## 2013-12-04 DIAGNOSIS — R0602 Shortness of breath: Secondary | ICD-10-CM

## 2013-12-04 MED ORDER — METFORMIN HCL ER 500 MG PO TB24: ORAL_TABLET | ORAL | Status: DC

## 2013-12-04 MED ORDER — FUROSEMIDE 20 MG PO TABS: ORAL_TABLET | ORAL | Status: DC

## 2013-12-13 ENCOUNTER — Encounter: Payer: Medicare Other | Admitting: *Deleted

## 2013-12-28 ENCOUNTER — Encounter: Payer: Self-pay | Admitting: *Deleted

## 2013-12-31 ENCOUNTER — Ambulatory Visit: Payer: Medicare Other | Admitting: Internal Medicine

## 2014-01-07 ENCOUNTER — Other Ambulatory Visit: Payer: Self-pay | Admitting: *Deleted

## 2014-01-07 MED ORDER — DULOXETINE HCL 20 MG PO CPEP: ORAL_CAPSULE | ORAL | Status: DC

## 2014-01-07 NOTE — Telephone Encounter (Signed)
[facility] 

## 2014-01-14 ENCOUNTER — Ambulatory Visit (INDEPENDENT_AMBULATORY_CARE_PROVIDER_SITE_OTHER): Payer: Medicare Other | Admitting: *Deleted

## 2014-01-14 DIAGNOSIS — Z950 Presence of cardiac pacemaker: Secondary | ICD-10-CM

## 2014-01-14 DIAGNOSIS — R001 Bradycardia, unspecified: Secondary | ICD-10-CM

## 2014-01-14 DIAGNOSIS — I498 Other specified cardiac arrhythmias: Secondary | ICD-10-CM

## 2014-01-15 LAB — MDC_IDC_ENUM_SESS_TYPE_REMOTE
Battery Voltage: 3 V
Brady Statistic AS VP Percent: 0 %
Brady Statistic AS VS Percent: 4.01 %
Brady Statistic RA Percent Paced: 95.99 %
Lead Channel Impedance Value: 364 Ohm
Lead Channel Impedance Value: 488 Ohm
Lead Channel Sensing Intrinsic Amplitude: 2.4 mV
Lead Channel Sensing Intrinsic Amplitude: 2.7 mV
Lead Channel Setting Pacing Amplitude: 2.5 V
Lead Channel Setting Pacing Pulse Width: 0.4 ms
MDC IDC SESS DTM: 20150406000957
MDC IDC SET LEADCHNL RA PACING AMPLITUDE: 2 V
MDC IDC SET LEADCHNL RV SENSING SENSITIVITY: 0.9 mV
MDC IDC SET ZONE DETECTION INTERVAL: 350 ms
MDC IDC SET ZONE DETECTION INTERVAL: 400 ms
MDC IDC STAT BRADY AP VP PERCENT: 0.08 %
MDC IDC STAT BRADY AP VS PERCENT: 95.91 %
MDC IDC STAT BRADY RV PERCENT PACED: 0.08 %

## 2014-01-17 ENCOUNTER — Other Ambulatory Visit: Payer: Self-pay

## 2014-01-24 ENCOUNTER — Encounter: Payer: Self-pay | Admitting: *Deleted

## 2014-02-05 ENCOUNTER — Encounter: Payer: Self-pay | Admitting: Internal Medicine

## 2014-02-25 ENCOUNTER — Other Ambulatory Visit: Payer: Medicare Other

## 2014-02-25 DIAGNOSIS — E119 Type 2 diabetes mellitus without complications: Secondary | ICD-10-CM

## 2014-02-25 DIAGNOSIS — E785 Hyperlipidemia, unspecified: Secondary | ICD-10-CM

## 2014-02-26 LAB — COMPREHENSIVE METABOLIC PANEL
A/G RATIO: 1.7 (ref 1.1–2.5)
ALT: 8 IU/L (ref 0–32)
AST: 17 IU/L (ref 0–40)
Albumin: 4.2 g/dL (ref 3.5–4.7)
Alkaline Phosphatase: 124 IU/L — ABNORMAL HIGH (ref 39–117)
BUN / CREAT RATIO: 22 (ref 11–26)
BUN: 22 mg/dL (ref 8–27)
CO2: 19 mmol/L (ref 18–29)
CREATININE: 0.98 mg/dL (ref 0.57–1.00)
Calcium: 9.6 mg/dL (ref 8.7–10.3)
Chloride: 105 mmol/L (ref 97–108)
GFR calc Af Amer: 63 mL/min/{1.73_m2} (ref >59)
GFR calc non Af Amer: 55 mL/min/{1.73_m2} — ABNORMAL LOW (ref >59)
GLOBULIN, TOTAL: 2.5 g/dL (ref 1.5–4.5)
Glucose: 150 mg/dL — ABNORMAL HIGH (ref 65–99)
Potassium: 5.4 mmol/L — ABNORMAL HIGH (ref 3.5–5.2)
Sodium: 141 mmol/L (ref 134–144)
Total Bilirubin: 0.2 mg/dL (ref 0.0–1.2)
Total Protein: 6.7 g/dL (ref 6.0–8.5)

## 2014-02-26 LAB — LIPID PANEL
Chol/HDL Ratio: 2.4 ratio units (ref 0.0–4.4)
Cholesterol, Total: 124 mg/dL (ref 100–199)
HDL: 52 mg/dL (ref >39)
LDL Calculated: 48 mg/dL (ref 0–99)
Triglycerides: 122 mg/dL (ref 0–149)
VLDL Cholesterol Cal: 24 mg/dL (ref 5–40)

## 2014-02-26 LAB — HEMOGLOBIN A1C
Est. average glucose Bld gHb Est-mCnc: 186 mg/dL
Hgb A1c MFr Bld: 8.1 % — ABNORMAL HIGH (ref 4.8–5.6)

## 2014-02-27 ENCOUNTER — Ambulatory Visit (INDEPENDENT_AMBULATORY_CARE_PROVIDER_SITE_OTHER): Payer: Medicare Other | Admitting: Internal Medicine

## 2014-02-27 ENCOUNTER — Encounter: Payer: Self-pay | Admitting: Internal Medicine

## 2014-02-27 ENCOUNTER — Other Ambulatory Visit: Payer: Medicare Other

## 2014-02-27 VITALS — BP 124/78 | HR 89 | Temp 97.6°F | Wt 159.0 lb

## 2014-02-27 DIAGNOSIS — E669 Obesity, unspecified: Secondary | ICD-10-CM

## 2014-02-27 DIAGNOSIS — F3289 Other specified depressive episodes: Secondary | ICD-10-CM

## 2014-02-27 DIAGNOSIS — E119 Type 2 diabetes mellitus without complications: Secondary | ICD-10-CM

## 2014-02-27 DIAGNOSIS — M545 Low back pain, unspecified: Secondary | ICD-10-CM

## 2014-02-27 DIAGNOSIS — F329 Major depressive disorder, single episode, unspecified: Secondary | ICD-10-CM | POA: Insufficient documentation

## 2014-02-27 DIAGNOSIS — F32A Depression, unspecified: Secondary | ICD-10-CM | POA: Insufficient documentation

## 2014-02-27 DIAGNOSIS — E785 Hyperlipidemia, unspecified: Secondary | ICD-10-CM

## 2014-02-27 DIAGNOSIS — Z950 Presence of cardiac pacemaker: Secondary | ICD-10-CM

## 2014-02-27 DIAGNOSIS — I10 Essential (primary) hypertension: Secondary | ICD-10-CM

## 2014-02-27 MED ORDER — ZOSTER VACCINE LIVE 19400 UNT/0.65ML ~~LOC~~ SOLR: 0.6500 mL | Freq: Once | SUBCUTANEOUS | Status: DC

## 2014-02-27 MED ORDER — EMPAGLIFLOZIN 10 MG PO TABS: ORAL_TABLET | ORAL | Status: DC

## 2014-02-27 NOTE — Progress Notes (Addendum)
Patient ID: Danielle Benitez, female   DOB: [birth year], 80 y.o.   MRN: [id number]    Location:  [facility]  Place of Service: OFFICE   Allergies  Allergen Reactions  . Beta Adrenergic Blockers   . Fluzone [Flu Virus Vaccine] Other (See Comments)    Feeling unwell for a week. Cold symptoms/flu symtoms  . Tetanus Toxoids     Chief Complaint  Patient presents with  . Medical Management of Chronic Issues    3 month follow-up, discuss labs (copy printed)   . 6CIT Dementia Test    Scored 6 out of 28 (Normal)   . Immunizations    Discuss need for pneumonia vaccine     HPI:  Diabetes mellitus - continues to have elevation in A1c to 8.1 and fasting glucose 150 mg%. Non compliant with diet. Obese.  Dyslipidemia: under good control  Hypertension: controlled  Low back pain: chronic and unchanged. Limits some of daily activities.  Obese: non compliant with diet  Depression: improved on Cymbalta.  Pacemaker. Followed by cardiology. Working well.  Medications: Patient's Medications  New Prescriptions   EMPAGLIFLOZIN (JARDIANCE) 10 MG TABS    One each morning to control glucose  Previous Medications   ALLOPURINOL (ZYLOPRIM) 300 MG TABLET    Take 1 tablet (300 mg total) by mouth daily.   ASPIRIN 81 MG TABLET    Take 81 mg by mouth daily. Take 1 tablet once daily to prevent  heart attack, stroke and blood clot.   ATORVASTATIN (LIPITOR) 40 MG TABLET    Take 0.5 tablets (20 mg total) by mouth daily at 6 PM.   DULOXETINE (CYMBALTA) 20 MG CAPSULE    Take 1 tablet daily to help treat anxiety and nerves.   FUROSEMIDE (LASIX) 20 MG TABLET    Take one tablet by mouth once daily as needed for diuretic   HYDROCODONE-ACETAMINOPHEN (NORCO/VICODIN) 5-325 MG PER TABLET    Take 1 tablet by mouth every 6 (six) hours as needed for pain. Take 1 tablet up to 4 times daily to control pain.   LISINOPRIL (PRINIVIL,ZESTRIL) 20 MG TABLET    Take 20 mg by mouth daily.   METFORMIN (GLUCOPHAGE-XR) 500 MG 24 HR TABLET     Take two tablets by mouth each morning.   POTASSIUM CHLORIDE (K-DUR) 10 MEQ TABLET    Take 1 tablet (10 mEq total) by mouth daily.   TRIAMCINOLONE CREAM (KENALOG) 0.1 %    Apply sparingly twice daily to areas of itching  Modified Medications   Modified Medication Previous Medication   ZOSTER VACCINE LIVE, PF, (ZOSTAVAX) [id number] UNT/0.65ML INJECTION zoster vaccine live, PF, (ZOSTAVAX) [id number] UNT/0.65ML injection      Inject 19,400 Units into the skin once.    Inject 0.65 mLs into the skin once.  Discontinued Medications   No medications on file     Review of Systems  Constitutional: Negative.   HENT: Negative.   Eyes:       Bilateral cataracts  Respiratory: Negative.   Cardiovascular: Positive for leg swelling. Negative for chest pain and palpitations.  Gastrointestinal: Negative.   Endocrine:       Diabetic  Genitourinary: Negative.   Musculoskeletal: Positive for back pain.  Skin:       Red itching papules on both arms. Possible insect bites.  Allergic/Immunologic:       History of allergic rhinitis  Neurological:       Occasional headaches. She is aware that she has some mild memory loss.  Hematological: Negative.   Psychiatric/Behavioral:       Prior hx of depressive symptoms.     Filed Vitals:   02/27/14 1129  BP: 124/78  Pulse: 89  Temp: 97.6 F (36.4 C)  TempSrc: Oral  Weight: 159 lb (72.122 kg)  SpO2: 98%   Body mass index is 27.72 kg/(m^2).  Physical Exam  Constitutional: She is oriented to person, place, and time.  Morbidly obese  HENT:  Head: Normocephalic and atraumatic.  Right Ear: External ear normal.  Left Ear: External ear normal.  Nose: Nose normal.  Eyes:  Bilateral cataracts  Neck: Normal range of motion. Neck supple. No JVD present. No tracheal deviation present. No thyromegaly present.  Cardiovascular: Normal rate, regular rhythm, normal heart sounds and intact distal pulses.  Exam reveals no gallop and no friction rub.   No murmur  heard. Pacemaker  Pulmonary/Chest: Effort normal. No respiratory distress. She has no wheezes. She has rales. She exhibits no tenderness.  Pacemaker left upper chest wall  Abdominal: Soft. Bowel sounds are normal. She exhibits no distension and no mass.  Musculoskeletal: She exhibits edema.  Pain in the left shoulder with abduction and with rotation  Lymphadenopathy:    She has no cervical adenopathy.  Neurological: She is alert and oriented to person, place, and time. She has normal reflexes. No cranial nerve deficit. Coordination normal.  Skin: No rash noted. No erythema. No pallor.  Eczematous lesions on arms  Psychiatric: She has a normal mood and affect. Her behavior is normal. Thought content normal.     Labs reviewed: Appointment on 02/25/2014  Component Date Value Ref Range Status  . Hemoglobin A1C 02/25/2014 8.1* 4.8 - 5.6 % Final   Comment:          Increased risk for diabetes: 5.7 - 6.4                                   Diabetes: >6.4                                   Glycemic control for adults with diabetes: <7.0  . Estimated average glucose 02/25/2014 186   Final  . Glucose 02/25/2014 150* 65 - 99 mg/dL Final  . BUN 16/07/9603 22  8 - 27 mg/dL Final  . Creatinine, Ser 02/25/2014 0.98  0.57 - 1.00 mg/dL Final  . GFR calc non Af Amer 02/25/2014 55* >59 mL/min/1.73 Final  . GFR calc Af Amer 02/25/2014 63  >59 mL/min/1.73 Final  . BUN/Creatinine Ratio 02/25/2014 22  11 - 26 Final  . Sodium 02/25/2014 141  134 - 144 mmol/L Final  . Potassium 02/25/2014 5.4* 3.5 - 5.2 mmol/L Final  . Chloride 02/25/2014 105  97 - 108 mmol/L Final  . CO2 02/25/2014 19  18 - 29 mmol/L Final  . Calcium 02/25/2014 9.6  8.7 - 10.3 mg/dL Final  . Total Protein 02/25/2014 6.7  6.0 - 8.5 g/dL Final  . Albumin 54/06/8118 4.2  3.5 - 4.7 g/dL Final  . Globulin, Total 02/25/2014 2.5  1.5 - 4.5 g/dL Final  . Albumin/Globulin Ratio 02/25/2014 1.7  1.1 - 2.5 Final  . Total Bilirubin 02/25/2014 0.2   0.0 - 1.2 mg/dL Final  . Alkaline Phosphatase 02/25/2014 124* 39 - 117 IU/L Final  . AST 02/25/2014 17  0 - 40 IU/L  Final  . ALT 02/25/2014 8  0 - 32 IU/L Final  . Cholesterol, Total 02/25/2014 124  100 - 199 mg/dL Final  . Triglycerides 02/25/2014 122  0 - 149 mg/dL Final  . HDL 75/07/2584 52  >39 mg/dL Final   Comment: According to ATP-III Guidelines, HDL-C >59 mg/dL is considered a                          negative risk factor for CHD.  Marland Kitchen VLDL Cholesterol Cal 02/25/2014 24  5 - 40 mg/dL Final  . LDL Calculated 02/25/2014 48  0 - 99 mg/dL Final  . Chol/HDL Ratio 02/25/2014 2.4  0.0 - 4.4 ratio units Final   Comment:                                   T. Chol/HDL Ratio                                                                      Men  Women                                                        1/2 Avg.Risk  3.4    3.3                                                            Avg.Risk  5.0    4.4                                                         2X Avg.Risk  9.6    7.1                                                         3X Avg.Risk 23.4   11.0  Clinical Support on 01/14/2014  Component Date Value Ref Range Status  . Date Time Interrogation Session 01/15/2014 [id number]   Final  . Pulse Generator Manufacturer 01/15/2014 Medtronic   Final  . Pulse Gen Model 01/15/2014 RVDR01 Revo MRI   Final  . Pulse Gen Serial Number 01/15/2014 [id number]   Final  . RV Sense Sensitivity 01/15/2014 0.9   Final  . RA Pace Amplitude 01/15/2014 2   Final  . RV Pace PulseWidth 01/15/2014 0.4   Final  . RV Pace Amplitude 01/15/2014 2.5   Final  . Zone Setting Type Category 01/15/2014 VF   Final  .  Zone Setting Type Category 01/15/2014 VT   Final  . Zone Setting Type Category 01/15/2014 VENTRICULAR_TACHYCARDIA_1   Final  . Zone Setting Type Category 01/15/2014 VENTRICULAR_TACHYCARDIA_2   Final  . Zone Detect Interval 01/15/2014 400   Final  . Zone Setting Type Category 01/15/2014  ATRIAL_FIBRILLATION   Final  . Zone Setting Type Category 01/15/2014 ATAF   Final  . Zone Detect Interval 01/15/2014 350   Final  . RA Impedance 01/15/2014 364   Final  . RA Amplitude 01/15/2014 2.7   Final  . RV IMPEDANCE 01/15/2014 488   Final  . RV Amplitude 01/15/2014 2.4   Final  . Battery Status 01/15/2014 OK   Final  . Battery Voltage 01/15/2014 3   Final  . [place]on Foley RA Perc Paced 01/15/2014 95.99   Final  . [place]on Foley RV Perc Paced 01/15/2014 0.08   Final  . [place]on Foley AP VP Percent 01/15/2014 0.08   Final  . [place]on Foley AS VP Percent 01/15/2014 0   Final  . [place]uston Foley AP VS Percent 01/15/2014 95.91   Final  . [place]on Foley AS VS Percent 01/15/2014 4.01   Final  . Eval Rhythm 01/15/2014 Ap/Vs   Final  . Miscellaneous Comment 01/15/2014    Final                   Value:Pacemaker remote check. Device function reviewed. Impedance, sensing consistent with previous measurements. Histograms appropriate for patient and level of activity. All other diagnostic data reviewed and is appropriate and stable for patient. Real                          time/magnet EGM shows appropriate sensing and capture. No mode switches. 5 NSVT---2 EGMs, longest 11 beats. Battery @3 .00V. Carelink 04/17/14 & ROV w/ GT 12/15.      Assessment/Plan  1. Diabetes mellitus Poor control - Empagliflozin (JARDIANCE) 10 MG TABS; One each morning to control glucose  Dispense: 30 tablet; Refill: 5 - continue metformin - Hemoglobin A1c; Future - Basic metabolic panel; Future  2. Dyslipidemia Good control  3. Hypertension controlled  4. Low back pain Chronic and unchanged  5. Obese recommend dietary compliance. Some patients lose weight on Jardiance.

## 2014-02-27 NOTE — Patient Instructions (Addendum)
Add Jardiance to your medications.

## 2014-02-28 LAB — MICROALBUMIN / CREATININE URINE RATIO
Creatinine, Ur: 54.3 mg/dL (ref 15.0–278.0)
MICROALB/CREAT RATIO: 22.5 mg/g creat (ref 0.0–30.0)
Microalbumin, Urine: 12.2 ug/mL (ref 0.0–17.0)

## 2014-03-11 ENCOUNTER — Other Ambulatory Visit: Payer: Self-pay | Admitting: *Deleted

## 2014-03-11 MED ORDER — ALLOPURINOL 300 MG PO TABS: 300.0000 mg | ORAL_TABLET | Freq: Every day | ORAL | Status: DC

## 2014-03-11 NOTE — Telephone Encounter (Signed)
[facility] 

## 2014-03-12 ENCOUNTER — Other Ambulatory Visit: Payer: Self-pay | Admitting: *Deleted

## 2014-03-12 DIAGNOSIS — R0602 Shortness of breath: Secondary | ICD-10-CM

## 2014-03-12 MED ORDER — LISINOPRIL 20 MG PO TABS: ORAL_TABLET | ORAL | Status: DC

## 2014-03-12 NOTE — Telephone Encounter (Signed)
[facility] 

## 2014-03-13 ENCOUNTER — Other Ambulatory Visit: Payer: Self-pay | Admitting: *Deleted

## 2014-03-13 MED ORDER — METFORMIN HCL ER 500 MG PO TB24: ORAL_TABLET | ORAL | Status: DC

## 2014-03-13 NOTE — Telephone Encounter (Signed)
[facility] called with Request and patient wanted #90 day supply

## 2014-03-27 ENCOUNTER — Telehealth: Payer: Self-pay | Admitting: *Deleted

## 2014-03-27 NOTE — Telephone Encounter (Signed)
Yeast infection is related to her diabetes, not the Jardiance. Call Rx for diflucan 100 mg (#3) One daily to treat yeast infection.

## 2014-03-27 NOTE — Telephone Encounter (Signed)
Patient caregiver called and stated that the new medication Jardiance has caused a yeast infection and they would like a Diflucan called in for this. Please Advise

## 2014-03-28 ENCOUNTER — Other Ambulatory Visit: Payer: Self-pay | Admitting: *Deleted

## 2014-03-28 MED ORDER — FLUCONAZOLE 100 MG PO TABS: ORAL_TABLET | ORAL | Status: DC

## 2014-03-28 NOTE — Telephone Encounter (Signed)
Rx called in to pharmacy and patient notified.  

## 2014-04-08 ENCOUNTER — Telehealth: Payer: Self-pay | Admitting: *Deleted

## 2014-04-08 ENCOUNTER — Encounter (HOSPITAL_BASED_OUTPATIENT_CLINIC_OR_DEPARTMENT_OTHER): Payer: Self-pay | Admitting: *Deleted

## 2014-04-08 ENCOUNTER — Other Ambulatory Visit: Payer: Self-pay | Admitting: Orthopedic Surgery

## 2014-04-08 NOTE — Progress Notes (Signed)
Reviewed with dr [email or web address] for here 

## 2014-04-08 NOTE — Telephone Encounter (Signed)
Patient called and just wanted to let you know that she has broken her right wrist yesterday.

## 2014-04-08 NOTE — Progress Notes (Signed)
Pt lives with daughter-able to sign own permit-still drives some-alert-no confusion-will need istat-bring all meds and overnight bag-has a pacer-form faxed to dr Ladona Ridgel

## 2014-04-09 ENCOUNTER — Encounter (HOSPITAL_BASED_OUTPATIENT_CLINIC_OR_DEPARTMENT_OTHER): Payer: Self-pay | Admitting: *Deleted

## 2014-04-09 ENCOUNTER — Encounter (HOSPITAL_BASED_OUTPATIENT_CLINIC_OR_DEPARTMENT_OTHER): Payer: Medicare Other | Admitting: Certified Registered"

## 2014-04-09 ENCOUNTER — Ambulatory Visit (HOSPITAL_BASED_OUTPATIENT_CLINIC_OR_DEPARTMENT_OTHER): Payer: Medicare Other | Admitting: Certified Registered"

## 2014-04-09 ENCOUNTER — Encounter (HOSPITAL_BASED_OUTPATIENT_CLINIC_OR_DEPARTMENT_OTHER)
Admission: RE | Disposition: A | Discharge status: Home / Self Care | Payer: Self-pay | Source: Ambulatory Visit | Attending: Orthopedic Surgery

## 2014-04-09 ENCOUNTER — Ambulatory Visit (HOSPITAL_BASED_OUTPATIENT_CLINIC_OR_DEPARTMENT_OTHER)
Admission: RE | Admit: 2014-04-09 | Discharge: 2014-04-10 | Disposition: A | Discharge status: Home / Self Care | Payer: Medicare Other | Source: Ambulatory Visit | Attending: Orthopedic Surgery | Admitting: Orthopedic Surgery

## 2014-04-09 DIAGNOSIS — F411 Generalized anxiety disorder: Secondary | ICD-10-CM | POA: Insufficient documentation

## 2014-04-09 DIAGNOSIS — W19XXXA Unspecified fall, initial encounter: Secondary | ICD-10-CM | POA: Insufficient documentation

## 2014-04-09 DIAGNOSIS — S52509A Unspecified fracture of the lower end of unspecified radius, initial encounter for closed fracture: Secondary | ICD-10-CM

## 2014-04-09 DIAGNOSIS — J4489 Other specified chronic obstructive pulmonary disease: Secondary | ICD-10-CM | POA: Insufficient documentation

## 2014-04-09 DIAGNOSIS — M1990 Unspecified osteoarthritis, unspecified site: Secondary | ICD-10-CM | POA: Insufficient documentation

## 2014-04-09 DIAGNOSIS — R269 Unspecified abnormalities of gait and mobility: Secondary | ICD-10-CM | POA: Insufficient documentation

## 2014-04-09 DIAGNOSIS — Z7982 Long term (current) use of aspirin: Secondary | ICD-10-CM | POA: Insufficient documentation

## 2014-04-09 DIAGNOSIS — Z950 Presence of cardiac pacemaker: Secondary | ICD-10-CM | POA: Insufficient documentation

## 2014-04-09 DIAGNOSIS — I129 Hypertensive chronic kidney disease with stage 1 through stage 4 chronic kidney disease, or unspecified chronic kidney disease: Secondary | ICD-10-CM | POA: Insufficient documentation

## 2014-04-09 DIAGNOSIS — S52501A Unspecified fracture of the lower end of right radius, initial encounter for closed fracture: Secondary | ICD-10-CM

## 2014-04-09 DIAGNOSIS — E119 Type 2 diabetes mellitus without complications: Secondary | ICD-10-CM | POA: Insufficient documentation

## 2014-04-09 DIAGNOSIS — F3289 Other specified depressive episodes: Secondary | ICD-10-CM | POA: Insufficient documentation

## 2014-04-09 DIAGNOSIS — F329 Major depressive disorder, single episode, unspecified: Secondary | ICD-10-CM | POA: Insufficient documentation

## 2014-04-09 DIAGNOSIS — N182 Chronic kidney disease, stage 2 (mild): Secondary | ICD-10-CM | POA: Insufficient documentation

## 2014-04-09 DIAGNOSIS — S52609A Unspecified fracture of lower end of unspecified ulna, initial encounter for closed fracture: Principal | ICD-10-CM

## 2014-04-09 DIAGNOSIS — J449 Chronic obstructive pulmonary disease, unspecified: Secondary | ICD-10-CM | POA: Insufficient documentation

## 2014-04-09 DIAGNOSIS — I2510 Atherosclerotic heart disease of native coronary artery without angina pectoris: Secondary | ICD-10-CM | POA: Insufficient documentation

## 2014-04-09 DIAGNOSIS — E669 Obesity, unspecified: Secondary | ICD-10-CM | POA: Insufficient documentation

## 2014-04-09 HISTORY — DX: Unspecified hearing loss, unspecified ear: H91.90

## 2014-04-09 HISTORY — DX: Presence of spectacles and contact lenses: Z97.3

## 2014-04-09 HISTORY — PX: ORIF WRIST FRACTURE: SHX2133

## 2014-04-09 HISTORY — DX: Complete loss of teeth, unspecified cause, unspecified class: K08.109

## 2014-04-09 HISTORY — DX: Presence of dental prosthetic device (complete) (partial): Z97.2

## 2014-04-09 LAB — POCT I-STAT, CHEM 8
BUN: 35 mg/dL — ABNORMAL HIGH (ref 6–23)
CALCIUM ION: 1.23 mmol/L (ref 1.13–1.30)
Chloride: 106 mEq/L (ref 96–112)
Creatinine, Ser: 1.4 mg/dL — ABNORMAL HIGH (ref 0.50–1.10)
Glucose, Bld: 186 mg/dL — ABNORMAL HIGH (ref 70–99)
HEMATOCRIT: 41 % (ref 36.0–46.0)
Hemoglobin: 13.9 g/dL (ref 12.0–15.0)
Potassium: 5 mEq/L (ref 3.7–5.3)
Sodium: 139 mEq/L (ref 137–147)
TCO2: 21 mmol/L (ref 0–100)

## 2014-04-09 LAB — GLUCOSE, CAPILLARY
Glucose-Capillary: 143 mg/dL — ABNORMAL HIGH (ref 70–99)
Glucose-Capillary: 251 mg/dL — ABNORMAL HIGH (ref 70–99)

## 2014-04-09 SURGERY — OPEN REDUCTION INTERNAL FIXATION (ORIF) WRIST FRACTURE
Anesthesia: Monitor Anesthesia Care | Site: Wrist | Laterality: Right

## 2014-04-09 MED ORDER — ALPRAZOLAM 0.5 MG PO TABS
0.5000 mg | ORAL_TABLET | Freq: Four times a day (QID) | ORAL | Status: DC | PRN
  Administered 2014-04-09: 0.5 mg via ORAL
  Filled 2014-04-09: qty 2

## 2014-04-09 MED ORDER — CEFAZOLIN SODIUM 1-5 GM-% IV SOLN
1.0000 g | INTRAVENOUS | Status: AC
  Administered 2014-04-09: 1 g via INTRAVENOUS

## 2014-04-09 MED ORDER — FENTANYL CITRATE 0.05 MG/ML IJ SOLN
INTRAMUSCULAR | Status: AC
  Filled 2014-04-09: qty 2

## 2014-04-09 MED ORDER — CHLORHEXIDINE GLUCONATE 4 % EX LIQD: 60.0000 mL | Freq: Once | CUTANEOUS | Status: DC

## 2014-04-09 MED ORDER — BUPIVACAINE HCL (PF) 0.25 % IJ SOLN
INTRAMUSCULAR | Status: AC
  Filled 2014-04-09: qty 30

## 2014-04-09 MED ORDER — EPHEDRINE SULFATE 50 MG/ML IJ SOLN
INTRAMUSCULAR | Status: DC | PRN
  Administered 2014-04-09 (×2): 10 mg via INTRAVENOUS

## 2014-04-09 MED ORDER — VITAMIN C 500 MG PO TABS: 1000.0000 mg | ORAL_TABLET | Freq: Every day | ORAL | Status: DC

## 2014-04-09 MED ORDER — LIDOCAINE HCL (CARDIAC) 20 MG/ML IV SOLN
INTRAVENOUS | Status: DC | PRN
  Administered 2014-04-09: 50 mg via INTRAVENOUS

## 2014-04-09 MED ORDER — POTASSIUM CHLORIDE ER 10 MEQ PO TBCR
10.0000 meq | EXTENDED_RELEASE_TABLET | Freq: Every day | ORAL | Status: DC
  Administered 2014-04-09: 10 meq via ORAL

## 2014-04-09 MED ORDER — ONDANSETRON HCL 4 MG PO TABS: 4.0000 mg | ORAL_TABLET | Freq: Four times a day (QID) | ORAL | Status: DC | PRN

## 2014-04-09 MED ORDER — CEFAZOLIN SODIUM 1-5 GM-% IV SOLN
1.0000 g | Freq: Three times a day (TID) | INTRAVENOUS | Status: DC
  Administered 2014-04-10: 1 g via INTRAVENOUS

## 2014-04-09 MED ORDER — SODIUM CHLORIDE 0.45 % IV SOLN
INTRAVENOUS | Status: DC
  Administered 2014-04-10: via INTRAVENOUS

## 2014-04-09 MED ORDER — MORPHINE SULFATE 2 MG/ML IJ SOLN
1.0000 mg | INTRAMUSCULAR | Status: DC | PRN
  Administered 2014-04-10: 1 mg via INTRAVENOUS
  Filled 2014-04-09: qty 1

## 2014-04-09 MED ORDER — ONDANSETRON HCL 4 MG/2ML IJ SOLN: 4.0000 mg | Freq: Four times a day (QID) | INTRAMUSCULAR | Status: DC | PRN

## 2014-04-09 MED ORDER — CLINDAMYCIN PHOSPHATE 900 MG/50ML IV SOLN
900.0000 mg | INTRAVENOUS | Status: AC
  Administered 2014-04-09: 900 mg via INTRAVENOUS

## 2014-04-09 MED ORDER — CEFAZOLIN SODIUM 1-5 GM-% IV SOLN
INTRAVENOUS | Status: AC
  Filled 2014-04-09: qty 50

## 2014-04-09 MED ORDER — BUPIVACAINE-EPINEPHRINE (PF) 0.5% -1:200000 IJ SOLN
INTRAMUSCULAR | Status: DC | PRN
Start: 2014-04-09 — End: 2014-04-09
  Administered 2014-04-09: 30 mL via PERINEURAL

## 2014-04-09 MED ORDER — OXYCODONE-ACETAMINOPHEN 5-325 MG PO TABS
1.0000 | ORAL_TABLET | ORAL | Status: DC | PRN
  Administered 2014-04-10: 2 via ORAL
  Filled 2014-04-09: qty 2

## 2014-04-09 MED ORDER — DULOXETINE HCL 20 MG PO CPEP
20.0000 mg | ORAL_CAPSULE | Freq: Every day | ORAL | Status: DC
  Administered 2014-04-09: 20 mg via ORAL
  Filled 2014-04-09: qty 1

## 2014-04-09 MED ORDER — LISINOPRIL 20 MG PO TABS: 20.0000 mg | ORAL_TABLET | Freq: Every day | ORAL | Status: DC

## 2014-04-09 MED ORDER — LACTATED RINGERS IV SOLN: INTRAVENOUS | Status: DC

## 2014-04-09 MED ORDER — FUROSEMIDE 20 MG PO TABS: 20.0000 mg | ORAL_TABLET | Freq: Every day | ORAL | Status: DC

## 2014-04-09 MED ORDER — SENNA 8.6 MG PO TABS: 1.0000 | ORAL_TABLET | Freq: Two times a day (BID) | ORAL | Status: DC

## 2014-04-09 MED ORDER — ATORVASTATIN CALCIUM 20 MG PO TABS
20.0000 mg | ORAL_TABLET | Freq: Every day | ORAL | Status: DC
  Administered 2014-04-09: 20 mg via ORAL

## 2014-04-09 MED ORDER — PHENYLEPHRINE HCL 10 MG/ML IJ SOLN
INTRAMUSCULAR | Status: DC | PRN
  Administered 2014-04-09 (×2): 40 ug via INTRAVENOUS

## 2014-04-09 MED ORDER — DIPHENHYDRAMINE HCL 25 MG PO CAPS: 25.0000 mg | ORAL_CAPSULE | Freq: Four times a day (QID) | ORAL | Status: DC | PRN

## 2014-04-09 MED ORDER — BUPIVACAINE HCL (PF) 0.5 % IJ SOLN
INTRAMUSCULAR | Status: DC | PRN
  Administered 2014-04-09: 5 mL

## 2014-04-09 MED ORDER — LACTATED RINGERS IV SOLN
INTRAVENOUS | Status: DC
  Administered 2014-04-09 (×2): via INTRAVENOUS

## 2014-04-09 MED ORDER — PROPOFOL INFUSION 10 MG/ML OPTIME
INTRAVENOUS | Status: DC | PRN
  Administered 2014-04-09: 100 ug/kg/min via INTRAVENOUS

## 2014-04-09 MED ORDER — MIDAZOLAM HCL 2 MG/2ML IJ SOLN: 1.0000 mg | INTRAMUSCULAR | Status: DC | PRN

## 2014-04-09 MED ORDER — ONDANSETRON HCL 4 MG/2ML IJ SOLN
INTRAMUSCULAR | Status: DC | PRN
  Administered 2014-04-09: 4 mg via INTRAVENOUS

## 2014-04-09 MED ORDER — ALLOPURINOL 300 MG PO TABS: 300.0000 mg | ORAL_TABLET | Freq: Every day | ORAL | Status: DC

## 2014-04-09 MED ORDER — CLINDAMYCIN PHOSPHATE 900 MG/50ML IV SOLN
INTRAVENOUS | Status: AC
  Filled 2014-04-09: qty 50

## 2014-04-09 MED ORDER — FENTANYL CITRATE 0.05 MG/ML IJ SOLN
50.0000 ug | INTRAMUSCULAR | Status: DC | PRN
  Administered 2014-04-09: 50 ug via INTRAVENOUS

## 2014-04-09 SURGICAL SUPPLY — 78 items
BANDAGE ELASTIC 3 VELCRO ST LF (GAUZE/BANDAGES/DRESSINGS) ×3 IMPLANT
BANDAGE ELASTIC 4 VELCRO ST LF (GAUZE/BANDAGES/DRESSINGS) ×3 IMPLANT
BIT DRILL 2.2 SS TIBIAL (BIT) ×2 IMPLANT
BLADE SURG 15 STRL LF DISP TIS (BLADE) ×2 IMPLANT
BLADE SURG 15 STRL SS (BLADE) ×6
BLADE SURG ROTATE 9660 (MISCELLANEOUS) IMPLANT
BNDG CONFORM 3 STRL LF (GAUZE/BANDAGES/DRESSINGS) ×3 IMPLANT
BRUSH SCRUB EZ PLAIN DRY (MISCELLANEOUS) ×3 IMPLANT
CANISTER SUCT 1200ML W/VALVE (MISCELLANEOUS) ×3 IMPLANT
CORDS BIPOLAR (ELECTRODE) ×3 IMPLANT
COVER MAYO STAND STRL (DRAPES) ×3 IMPLANT
COVER TABLE BACK 60X90 (DRAPES) ×3 IMPLANT
CUFF TOURNIQUET SINGLE 18IN (TOURNIQUET CUFF) ×2 IMPLANT
DECANTER SPIKE VIAL GLASS SM (MISCELLANEOUS) IMPLANT
DRAPE EXTREMITY T 121X128X90 (DRAPE) ×3 IMPLANT
DRAPE OEC MINIVIEW 54X84 (DRAPES) ×3 IMPLANT
DRAPE SURG 17X23 STRL (DRAPES) ×3 IMPLANT
DRSG EMULSION OIL 3X3 NADH (GAUZE/BANDAGES/DRESSINGS) ×3 IMPLANT
GAUZE SPONGE 4X4 12PLY STRL (GAUZE/BANDAGES/DRESSINGS) ×3 IMPLANT
GAUZE SPONGE 4X4 16PLY XRAY LF (GAUZE/BANDAGES/DRESSINGS) IMPLANT
GAUZE XEROFORM 1X8 LF (GAUZE/BANDAGES/DRESSINGS) ×2 IMPLANT
GLOVE BIO SURGEON STRL SZ7 (GLOVE) ×2 IMPLANT
GLOVE BIOGEL M STRL SZ7.5 (GLOVE) ×3 IMPLANT
GLOVE BIOGEL PI IND STRL 7.5 (GLOVE) IMPLANT
GLOVE BIOGEL PI INDICATOR 7.5 (GLOVE) ×2
GLOVE SS BIOGEL STRL SZ 8 (GLOVE) ×1 IMPLANT
GLOVE SUPERSENSE BIOGEL SZ 8 (GLOVE) ×2
GOWN STRL REUS W/ TWL LRG LVL3 (GOWN DISPOSABLE) ×1 IMPLANT
GOWN STRL REUS W/ TWL XL LVL3 (GOWN DISPOSABLE) ×1 IMPLANT
GOWN STRL REUS W/TWL LRG LVL3 (GOWN DISPOSABLE) ×6
GOWN STRL REUS W/TWL XL LVL3 (GOWN DISPOSABLE) ×3
NDL HYPO 25X1 1.5 SAFETY (NEEDLE) ×1 IMPLANT
NEEDLE HYPO 22GX1.5 SAFETY (NEEDLE) IMPLANT
NEEDLE HYPO 25X1 1.5 SAFETY (NEEDLE) IMPLANT
NS IRRIG 1000ML POUR BTL (IV SOLUTION) ×3 IMPLANT
PACK BASIN DAY SURGERY FS (CUSTOM PROCEDURE TRAY) ×3 IMPLANT
PAD CAST 3X4 CTTN HI CHSV (CAST SUPPLIES) ×2 IMPLANT
PAD CAST 4YDX4 CTTN HI CHSV (CAST SUPPLIES) ×1 IMPLANT
PADDING CAST ABS 3INX4YD NS (CAST SUPPLIES) ×2
PADDING CAST ABS 4INX4YD NS (CAST SUPPLIES) ×2
PADDING CAST ABS COTTON 3X4 (CAST SUPPLIES) ×1 IMPLANT
PADDING CAST ABS COTTON 4X4 ST (CAST SUPPLIES) ×1 IMPLANT
PADDING CAST COTTON 3X4 STRL (CAST SUPPLIES) ×6
PADDING CAST COTTON 4X4 STRL (CAST SUPPLIES) ×3
PEG LOCKING SMOOTH 2.2X16 (Screw) ×2 IMPLANT
PEG LOCKING SMOOTH 2.2X18 (Peg) ×2 IMPLANT
PEG LOCKING SMOOTH 2.2X20 (Screw) ×6 IMPLANT
PLATE NARROW DVR RIGHT (Plate) ×2 IMPLANT
SCREW LOCK 14X2.7X 3 LD TPR (Screw) IMPLANT
SCREW LOCK 20X2.7X 3 LD TPR (Screw) IMPLANT
SCREW LOCKING 2.7X13MM (Screw) ×2 IMPLANT
SCREW LOCKING 2.7X14 (Screw) ×3 IMPLANT
SCREW LOCKING 2.7X15MM (Screw) ×4 IMPLANT
SCREW LOCKING 2.7X20MM (Screw) ×3 IMPLANT
SHEET MEDIUM DRAPE 40X70 STRL (DRAPES) IMPLANT
SLEEVE SCD COMPRESS KNEE MED (MISCELLANEOUS) ×2 IMPLANT
SPLINT FIBERGLASS 3X35 (CAST SUPPLIES) ×2 IMPLANT
SPLINT FIBERGLASS 4X30 (CAST SUPPLIES) IMPLANT
SPLINT PLASTER CAST XFAST 3X15 (CAST SUPPLIES) IMPLANT
SPLINT PLASTER XTRA FASTSET 3X (CAST SUPPLIES)
STOCKINETTE 4X48 STRL (DRAPES) ×3 IMPLANT
STOCKINETTE SYNTHETIC 3 UNSTER (CAST SUPPLIES) ×3 IMPLANT
SUCTION FRAZIER TIP 10 FR DISP (SUCTIONS) ×3 IMPLANT
SUT ETHILON 4 0 PS 2 18 (SUTURE) IMPLANT
SUT PROLENE 4 0 PS 2 18 (SUTURE) ×6 IMPLANT
SUT VIC AB 3-0 FS2 27 (SUTURE) ×3 IMPLANT
SYR BULB 3OZ (MISCELLANEOUS) ×3 IMPLANT
SYR CONTROL 10ML LL (SYRINGE) IMPLANT
SYSTEM CHEST DRAIN TLS 7FR (DRAIN) ×2 IMPLANT
TAPE SURG TRANSPORE 1 IN (GAUZE/BANDAGES/DRESSINGS) ×1 IMPLANT
TAPE SURGICAL TRANSPORE 1 IN (GAUZE/BANDAGES/DRESSINGS) ×2
TOWEL OR 17X24 6PK STRL BLUE (TOWEL DISPOSABLE) ×4 IMPLANT
TOWEL OR NON WOVEN STRL DISP B (DISPOSABLE) ×3 IMPLANT
TRAP DIGIT (INSTRUMENTS) IMPLANT
TRAP FINGER LRG (INSTRUMENTS) IMPLANT
TUBE CONNECTING 20'X1/4 (TUBING) ×1
TUBE CONNECTING 20X1/4 (TUBING) ×2 IMPLANT
UNDERPAD 30X30 INCONTINENT (UNDERPADS AND DIAPERS) ×3 IMPLANT

## 2014-04-09 NOTE — H&P (Signed)
Danielle Benitez is an 80 y.o. female.   Chief Complaint: Fracture right wrist HPI: Patient presents for evaluation and treatment of her broken right wrist. I've counseled her extensively and she desires to perceive with open reduction internal fixation. She complains of right knee pain. Her knee is a fragile as a baseline she states due to arthritic change.  She notes no left upper extremity complaints or left lower extremity pain complaints.  She denies neck or back pain she denies chest pain. She is here for surgical evaluation.  Past Medical History  Diagnosis Date  . Diabetes mellitus   . Hypertension   . Gout   . Osteoarthritis   . Bursitis   . S/P cardiac pacemaker procedure, PPM [employer] REVO placed 01/11/2012 01/11/2012  . Personal history of fall   . Chronic kidney disease, stage II (mild)   . Memory loss   . Anxiety state, unspecified   . Other and unspecified hyperlipidemia   . Anemia, unspecified   . Abnormality of gait   . Shortness of breath   . Depressive disorder, not elsewhere classified   . Rheumatism, unspecified and fibrositis   . Lumbago   . Other nonspecific abnormal serum enzyme levels   . Nonspecific abnormal results of liver function study   . Obesity, unspecified   . Internal hemorrhoids without mention of complication   . Viremia, unspecified   . Osteoarthrosis, unspecified whether generalized or localized, unspecified site   . Abnormality of gait   . Viremia, unspecified   . Lumbago   . Rheumatism, unspecified and fibrositis   . Pain in joint, shoulder region 11/22/2013    leeft   . Pruritus 11/22/2013  . Eczema 11/22/2013  . Full dentures   . HOH (hard of hearing)   . Wears glasses     Past Surgical History  Procedure Laterality Date  . Extremity cyst excision    . Cardiac catherterization  12/03/2003    Dr Clarene DukeLittle  . Pacemaker placement  2013  . Colonoscopy      Family History  Problem Relation Age of Onset  . Breast cancer Mother   .  Cancer Mother   . Diabetes Daughter   . Hypertension Daughter   . Cancer Brother    Social History:  reports that she has never smoked. She does not have any smokeless tobacco history on file. She reports that she does not drink alcohol or use illicit drugs.  Allergies:  Allergies  Allergen Reactions  . Beta Adrenergic Blockers   . Fluzone [Flu Virus Vaccine] Other (See Comments)    Feeling unwell for a week. Cold symptoms/flu symtoms  . Tetanus Toxoids     Medications Prior to Admission  Medication Sig Dispense Refill  . allopurinol (ZYLOPRIM) 300 MG tablet Take 1 tablet (300 mg total) by mouth daily.  30 tablet  3  . aspirin 81 MG tablet Take 81 mg by mouth daily. Take 1 tablet once daily to prevent  heart attack, stroke and blood clot.      Marland Kitchen. atorvastatin (LIPITOR) 40 MG tablet Take 0.5 tablets (20 mg total) by mouth daily at 6 PM.  30 tablet  5  . DULoxetine (CYMBALTA) 20 MG capsule Take 1 tablet daily to help treat anxiety and nerves.  30 capsule  5  . Empagliflozin (JARDIANCE) 10 MG TABS One each morning to control glucose  30 tablet  5  . fluconazole (DIFLUCAN) 100 MG tablet Take one tablet by mouth once daily  for yeast infection  3 tablet  0  . furosemide (LASIX) 20 MG tablet Take one tablet by mouth once daily as needed for diuretic  30 tablet  3  . HYDROcodone-acetaminophen (NORCO/VICODIN) 5-325 MG per tablet Take 1 tablet by mouth every 6 (six) hours as needed for pain. Take 1 tablet up to 4 times daily to control pain.      Marland Kitchen lisinopril (PRINIVIL,ZESTRIL) 20 MG tablet Take one tablet by mouth once daily for blood pressure  90 tablet  3  . metFORMIN (GLUCOPHAGE-XR) 500 MG 24 hr tablet Take two tablets by mouth each morning.  180 tablet  1  . potassium chloride (K-DUR) 10 MEQ tablet Take 1 tablet (10 mEq total) by mouth daily.  30 tablet  11  . triamcinolone cream (KENALOG) 0.1 % Apply sparingly twice daily to areas of itching  30 g  3  . zoster vaccine live, PF, (ZOSTAVAX)  19400 UNT/0.65ML injection Inject 19,400 Units into the skin once.  1 each  0    Results for orders placed during the hospital encounter of 04/09/14 (from the past 48 hour(s))  POCT I-STAT, CHEM 8     Status: Abnormal   Collection Time    04/09/14 12:38 PM      Result Value Ref Range   Sodium 139  137 - 147 mEq/L   Potassium 5.0  3.7 - 5.3 mEq/L   Chloride 106  96 - 112 mEq/L   BUN 35 (*) 6 - 23 mg/dL   Creatinine, Ser 6.81 (*) 0.50 - 1.10 mg/dL   Glucose, Bld 275 (*) 70 - 99 mg/dL   Calcium, Ion 1.70  0.17 - 1.30 mmol/L   TCO2 21  0 - 100 mmol/L   Hemoglobin 13.9  12.0 - 15.0 g/dL   HCT 49.4  49.6 - 75.9 %   No results found.  Review of Systems  Respiratory: Negative.   Genitourinary: Negative.   Neurological: Negative.   Psychiatric/Behavioral: Negative.     Blood pressure 100/43, pulse 70, temperature 97.5 F (36.4 C), temperature source Oral, resp. rate 13, height 5\' 3"  (1.6 m), weight 71.271 kg (157 lb 2 oz), SpO2 95.00%. Physical Exam fracture right radius with displacement. Intact refill no compartment syndrome no evidence of infection. Elbow is stable there  Right knee has no effusion she can perform a straight leg raise she has no evidence of DVT infection or vascular compromise. She is generally tender at the joint line.  The patient is alert and oriented in no acute distress the patient complains of pain in the affected upper extremity.  The patient is noted to have a normal HEENT exam.  Lung fields show equal chest expansion and no shortness of breath  abdomen exam is nontender without distention.  Lower extremity examination does not show any fracture dislocation or blood clot symptoms.  Pelvis is stable neck and back are stable and nontender  Assessment/Plan Will plan open reduction internal fixation of her right wrist fracture and repair is necessary. We'll also plan x-ray evaluation of her knee intraoperatively.  Patient's where all issues as is her  family.  We are planning surgery for your upper extremity. The risk and benefits of surgery include risk of bleeding infection anesthesia damage to normal structures and failure of the surgery to accomplish its intended goals of relieving symptoms and restoring function with this in mind we'll going to proceed. I have specifically discussed with the patient the pre-and postoperative regime and the does  and don'ts and risk and benefits in great detail. Risk and benefits of surgery also include risk of dystrophy chronic nerve pain failure of the healing process to go onto completion and other inherent risks of surgery The relavent the pathophysiology of the disease/injury process, as well as the alternatives for treatment and postoperative course of action has been discussed in great detail with the patient who desires to proceed.  We will do everything in our power to help you (the patient) restore function to the upper extremity. Is a pleasure to see this patient today.   GRAMIG III,WILLIAM M 04/09/2014, 1:56 PM

## 2014-04-09 NOTE — Progress Notes (Signed)
Assisted Dr. Fitzgerald with right, ultrasound guided, supraclavicular block. Side rails up, monitors on throughout procedure. See vital signs in flow sheet. Tolerated Procedure well. 

## 2014-04-09 NOTE — Transfer of Care (Signed)
Immediate Anesthesia Transfer of Care Note  Patient: Danielle Benitez  Procedure(s) Performed: Procedure(s): OPEN REDUCTION INTERNAL FIXATION (ORIF) RIGHT DISPLACED  DISTAL RADIUS  FRACTURE (Right)  Patient Location: PACU  Anesthesia Type:MAC and Regional  Level of Consciousness: awake, alert  and oriented  Airway & Oxygen Therapy: Patient Spontanous Breathing and Patient connected to face mask oxygen  Post-op Assessment: Report given to PACU RN and Post -op Vital signs reviewed and stable  Post vital signs: Reviewed and stable  Complications: No apparent anesthesia complications

## 2014-04-09 NOTE — Anesthesia Procedure Notes (Addendum)
Anesthesia Regional Block:  Supraclavicular block  Pre-Anesthetic Checklist: ,, timeout performed, Correct Patient, Correct Site, Correct Laterality, Correct Procedure, Correct Position, site marked, Risks and benefits discussed, pre-op evaluation, post-op pain management  Laterality: Right  Prep: Maximum Sterile Barrier Precautions used and chloraprep       Needles:  Injection technique: Single-shot  Needle Type: Echogenic Stimulator Needle     Needle Length: 5cm 5 cm Needle Gauge: 22 and 22 G    Additional Needles:  Procedures: ultrasound guided (picture in chart) Supraclavicular block Narrative:  Start time: 04/09/2014 1:09 PM End time: 04/09/2014 1:18 PM Injection made incrementally with aspirations every 5 mL. Anesthesiologist: Fitzgerald,MD  Additional Notes: 2% Lidocaine skin wheel. Intercostobrachial block with 5cc of 0.5% Bupivicaine plain.   Procedure Name: MAC Date/Time: 04/09/2014 2:10 PM Performed by: Caren Macadam Pre-anesthesia Checklist: Patient identified, Timeout performed, Emergency Drugs available, Suction available and Patient being monitored Patient Re-evaluated:Patient Re-evaluated prior to inductionOxygen Delivery Method: Simple face mask Preoxygenation: Pre-oxygenation with 100% oxygen Intubation Type: IV induction Placement Confirmation: positive ETCO2

## 2014-04-09 NOTE — Discharge Instructions (Signed)

## 2014-04-09 NOTE — Op Note (Signed)
Please see full dictation [id number] Gramig MD

## 2014-04-09 NOTE — Anesthesia Postprocedure Evaluation (Signed)
  Anesthesia Post-op Note  Patient: Danielle Benitez  Procedure(s) Performed: Procedure(s): OPEN REDUCTION INTERNAL FIXATION (ORIF) RIGHT DISPLACED  DISTAL RADIUS  FRACTURE (Right)  Patient Location: PACU  Anesthesia Type: MAC and block  Level of Consciousness: awake and alert   Airway and Oxygen Therapy: Patient Spontanous Breathing  Post-op Pain: none  Post-op Assessment: Post-op Vital signs reviewed, Patient's Cardiovascular Status Stable and Respiratory Function Stable  Post-op Vital Signs: Reviewed  Filed Vitals:   04/09/14 1545  BP: 109/48  Pulse: 70  Temp:   Resp: 15    Complications: No apparent anesthesia complications

## 2014-04-09 NOTE — Anesthesia Preprocedure Evaluation (Signed)
Anesthesia Evaluation  Patient identified by MRN, date of birth, ID band Patient awake    Reviewed: Allergy & Precautions, H&P , NPO status , Patient's Chart, lab work & pertinent test results  Airway Mallampati: II TM Distance: >3 FB Neck ROM: Full    Dental no notable dental hx. (+) Upper Dentures, Lower Dentures, Dental Advisory Given   Pulmonary neg pulmonary ROS, COPD breath sounds clear to auscultation  Pulmonary exam normal       Cardiovascular hypertension, On Medications + CAD + pacemaker Rhythm:Regular Rate:Normal     Neuro/Psych Anxiety Depression negative neurological ROS     GI/Hepatic negative GI ROS, Neg liver ROS,   Endo/Other  negative endocrine ROSdiabetes  Renal/GU Renal disease  negative genitourinary   Musculoskeletal   Abdominal   Peds  Hematology negative hematology ROS (+) anemia ,   Anesthesia Other Findings   Reproductive/Obstetrics negative OB ROS                           Anesthesia Physical Anesthesia Plan  ASA: III  Anesthesia Plan: MAC and Regional   Post-op Pain Management:    Induction: Intravenous  Airway Management Planned: Simple Face Mask  Additional Equipment:   Intra-op Plan:   Post-operative Plan:   Informed Consent: I have reviewed the patients History and Physical, chart, labs and discussed the procedure including the risks, benefits and alternatives for the proposed anesthesia with the patient or authorized representative who has indicated his/her understanding and acceptance.   Dental advisory given  Plan Discussed with: CRNA  Anesthesia Plan Comments:         Anesthesia Quick Evaluation

## 2014-04-10 ENCOUNTER — Encounter (HOSPITAL_BASED_OUTPATIENT_CLINIC_OR_DEPARTMENT_OTHER): Payer: Self-pay | Admitting: Orthopedic Surgery

## 2014-04-10 MED ORDER — FLUCONAZOLE 150 MG PO TABS: 150.0000 mg | ORAL_TABLET | Freq: Every day | ORAL | Status: DC

## 2014-04-10 NOTE — Op Note (Signed)
NAMJeneen Rinks:  Yehle, Nanako                   ACCOUNT NO.:  [id number]  MEDICAL RECORD NO.:  [id number]  LOCATION:                                FACILITY:  [facility]  PHYSICIAN:  Dionne AnoWilliam M. Janete Quilling, M.D.DATE OF BIRTH:  [birth year]  DATE OF PROCEDURE:  04/09/2014 DATE OF DISCHARGE:  04/10/2014                              OPERATIVE REPORT   PREOPERATIVE DIAGNOSES: 1. Comminuted complex greater than three-part intra-articular distal     radius fracture, right upper extremity. 2. Right knee pain.  POSTOPERATIVE DIAGNOSES: 1. Comminuted complex greater than three-part intra-articular distal     radius fracture, right upper extremity. 2. Right knee pain.  PROCEDURES: 1. Open reduction and internal fixation, distal radius fracture, right     upper extremity, greater than three-part intra-articular comminuted     fracture. 2. AP, lateral and oblique x-rays, right wrist performed, interpreted     and evaluated by myself. 3. Sliding brachioradialis tenotomy, right wrist. 4. Closed treatment of ulnar styloid fracture, right wrist. 5. Evaluation of right knee under anesthesia and AP, lateral and     oblique radiographs and they were performed, evaluated and     interpreted by myself.  SURGEON:  Dionne AnoWilliam M. Amanda PeaGramig, M.D.  ASSISTANT:  Karie ChimeraBrian Buchanan, P.A.-C.  COMPLICATION:  None.  ANESTHESIA:  Block anesthetic with light IV sedation.  TOURNIQUET TIME:  Less than an hour.  INDICATIONS:  Pleasant female, highly functional, independent lady who drives, cooks, cleans and does everything independently.  She fell, has comminuted fracture.  She desires definitive fixation to try and give her the best quality of life if possible.  I discussed the risks, benefits, its timeframe, duration of recovery and other issues as they are germane and pertain to her upper extremity and lower extremity predicament.  OPERATION IN DETAIL:  The patient was seen by myself and anesthesia, taken to the operative suite,  underwent smooth induction of IV sedation. Block was placed without difficulty preoperatively.  Ancef was given preoperatively.  She tolerated this well.  Under sterile field after thorough prep and drape supervised by myself, volar radial incision was made.  Dissection was carried down.  Skin was retracted.  FCR tendon sheath was incised palmarly and dorsally.  Carpal canal contents were then retracted radial to ulna.  Pronator incised.  Fracture accessed. Sliding brachioradialis tenotomy was then accomplished to lessen the deforming forces of the distal fragmentation.  Following this, a comminuted complex fracture was reduced and a narrow DVR plate and screw construct was accomplished achieving adequate radial height, inclination and volar tilt to my satisfaction without difficulty.  I performed AP, lateral and oblique x-rays, performed, evaluated and interpreted by myself, which I deemed to look excellent.  I performed evaluation under anesthesia and closed treatment of the ulnar styloid fracture given the fact that there was no gross TFC instability.  Following this, we irrigated it copiously, closed the pronator and following this, placed a TLS drain followed by closure of the skin edge. Thus, this was an ORIF of the radius, closed treatment of ulnar styloid and a DVR narrow plate was used for fixation purposes.  She had smooth arc of motion.  No complications.  After the wound was closed and TLS drain was hooked to suction, volar fiberglass splint was applied.  Following this, we performed evaluation under anesthesia of her knee. Preoperatively, she had a normal straight leg raise.  She had pain, some degree of swelling, but no hemarthrosis.  X-rays showed mild-to-moderate degenerative changes.  She will be monitored in the recovery room.  I will see her back in the office in 12 days.  Sutures out, standard DVR protocol for interval range of motion at 4 weeks after weeks 2-4 of  immobilization with the cast and after 8 weeks, we will begin strengthening, predicated upon radiographic healing.  Do's and don'ts have been discussed.  All questions have been encouraged and answered.  It was pleasure to see her today and participate in her care.     Dionne Ano. Amanda Pea, M.D.     [facility]  D:  04/09/2014  T:  04/10/2014  Job:  [id number]

## 2014-04-10 NOTE — Discharge Summary (Signed)
  Patient has been seen and examined. Patient has pain appropriate to his injury/process. Patient denies new complaints at this present time. I have discussed his care with nursing staff. Patient is appropriate and alert.  We reviewed vital signs and intake output which are stable.  The upper extremity is neurovascularly intact. Refill is normal. There is no signs of compartment syndrome. There is no signs of dystrophy. There is normal sensation.  I have spent a  great deal of time discussing range of motion edema control and other techniques to decrease edema and promote flexion extension of the fingers. Patient understands the importance of elevation range of motion massage and other measures to lessen pain and prevent swelling.  We have discussed with the patient shoulder range of motion to prevent adhesive capsulitis.  The remainder of the examination is normal today without complicating feature.  Drain was removed without difficulty  Patient will be discharged home. Will plan to see the patient back in the off as per discharge instructions (please see discharge instructions).  Patient had an uneventful hospital course. At the time of discharge patient is stable awake alert and oriented in no acute distress. Regular diet will be continued and has been tolerated. Patient will notify should have problems occur. There is no signs of DVT infection or other complication at this juncture.  All questions have been incurred and answered.  I will give Diflucan 150 mg by mouth x1 for yeast infection prophylaxis which she is prone to.  She looks great she can move massage and elevate and call for any problems  Omeed Osuna M.D.

## 2014-04-17 ENCOUNTER — Telehealth: Payer: Self-pay | Admitting: Cardiology

## 2014-04-17 ENCOUNTER — Encounter: Payer: Medicare Other | Admitting: *Deleted

## 2014-04-17 NOTE — Telephone Encounter (Signed)
Spoke with pt and reminded pt of remote transmission that is due today. Pt verbalized understanding.   

## 2014-04-18 ENCOUNTER — Encounter: Payer: Self-pay | Admitting: Cardiology

## 2014-05-05 DIAGNOSIS — I498 Other specified cardiac arrhythmias: Secondary | ICD-10-CM

## 2014-05-05 DIAGNOSIS — Z950 Presence of cardiac pacemaker: Secondary | ICD-10-CM

## 2014-05-06 ENCOUNTER — Ambulatory Visit (INDEPENDENT_AMBULATORY_CARE_PROVIDER_SITE_OTHER): Payer: Medicare Other | Admitting: *Deleted

## 2014-05-06 DIAGNOSIS — Z950 Presence of cardiac pacemaker: Secondary | ICD-10-CM

## 2014-05-06 DIAGNOSIS — R001 Bradycardia, unspecified: Secondary | ICD-10-CM

## 2014-05-06 LAB — MDC_IDC_ENUM_SESS_TYPE_REMOTE
Battery Voltage: 3 V
Brady Statistic AP VP Percent: 0.06 %
Brady Statistic AP VS Percent: 97.7 %
Brady Statistic RA Percent Paced: 97.76 %
Date Time Interrogation Session: 20150727012524
Lead Channel Impedance Value: 536 Ohm
Lead Channel Sensing Intrinsic Amplitude: 3.1 mV
Lead Channel Setting Pacing Amplitude: 2.5 V
MDC IDC MSMT LEADCHNL RA IMPEDANCE VALUE: 352 Ohm
MDC IDC MSMT LEADCHNL RA SENSING INTR AMPL: 2.1 mV
MDC IDC SET LEADCHNL RA PACING AMPLITUDE: 2 V
MDC IDC SET LEADCHNL RV PACING PULSEWIDTH: 0.4 ms
MDC IDC SET LEADCHNL RV SENSING SENSITIVITY: 0.9 mV
MDC IDC SET ZONE DETECTION INTERVAL: 350 ms
MDC IDC SET ZONE DETECTION INTERVAL: 400 ms
MDC IDC STAT BRADY AS VP PERCENT: 0 %
MDC IDC STAT BRADY AS VS PERCENT: 2.24 %
MDC IDC STAT BRADY RV PERCENT PACED: 0.06 %

## 2014-05-06 NOTE — Progress Notes (Signed)
Remote pacemaker transmission.   

## 2014-05-17 ENCOUNTER — Encounter: Payer: Self-pay | Admitting: Cardiology

## 2014-05-21 ENCOUNTER — Encounter: Payer: Self-pay | Admitting: Cardiology

## 2014-05-23 ENCOUNTER — Encounter: Payer: Self-pay | Admitting: Internal Medicine

## 2014-05-27 ENCOUNTER — Other Ambulatory Visit: Payer: Medicare Other

## 2014-05-29 ENCOUNTER — Ambulatory Visit: Payer: Medicare Other | Admitting: Internal Medicine

## 2014-06-11 ENCOUNTER — Other Ambulatory Visit: Payer: Self-pay | Admitting: *Deleted

## 2014-06-11 MED ORDER — ATORVASTATIN CALCIUM 40 MG PO TABS: 20.0000 mg | ORAL_TABLET | Freq: Every day | ORAL | Status: DC

## 2014-06-11 MED ORDER — ALLOPURINOL 300 MG PO TABS: 300.0000 mg | ORAL_TABLET | Freq: Every day | ORAL | Status: DC

## 2014-06-11 MED ORDER — DULOXETINE HCL 20 MG PO CPEP: ORAL_CAPSULE | ORAL | Status: DC

## 2014-06-11 NOTE — Telephone Encounter (Signed)
[facility] 

## 2014-06-12 ENCOUNTER — Other Ambulatory Visit: Payer: Self-pay | Admitting: *Deleted

## 2014-06-12 MED ORDER — POTASSIUM CHLORIDE ER 10 MEQ PO TBCR: 10.0000 meq | EXTENDED_RELEASE_TABLET | Freq: Every day | ORAL | Status: DC

## 2014-06-13 ENCOUNTER — Other Ambulatory Visit: Payer: Self-pay | Admitting: *Deleted

## 2014-06-13 MED ORDER — DULOXETINE HCL 20 MG PO CPEP: ORAL_CAPSULE | ORAL | Status: DC

## 2014-06-13 MED ORDER — ALLOPURINOL 300 MG PO TABS: 300.0000 mg | ORAL_TABLET | Freq: Every day | ORAL | Status: DC

## 2014-06-13 MED ORDER — ATORVASTATIN CALCIUM 40 MG PO TABS: 20.0000 mg | ORAL_TABLET | Freq: Every day | ORAL | Status: DC

## 2014-06-13 NOTE — Telephone Encounter (Signed)
[facility] 

## 2014-06-21 ENCOUNTER — Other Ambulatory Visit: Payer: Self-pay | Admitting: *Deleted

## 2014-06-21 MED ORDER — EMPAGLIFLOZIN 10 MG PO TABS: ORAL_TABLET | ORAL | Status: DC

## 2014-06-21 MED ORDER — EMPAGLIFLOZIN 10 MG PO TABS
ORAL_TABLET | ORAL | Status: DC
Start: 2014-06-21 — End: 2015-01-23

## 2014-06-21 NOTE — Telephone Encounter (Signed)
[facility] 

## 2014-06-24 ENCOUNTER — Other Ambulatory Visit: Payer: Self-pay | Admitting: *Deleted

## 2014-06-24 DIAGNOSIS — R0602 Shortness of breath: Secondary | ICD-10-CM

## 2014-06-24 MED ORDER — FUROSEMIDE 20 MG PO TABS: ORAL_TABLET | ORAL | Status: DC

## 2014-06-24 NOTE — Telephone Encounter (Signed)
[facility] 

## 2014-08-12 ENCOUNTER — Other Ambulatory Visit: Payer: Medicare Other

## 2014-08-12 DIAGNOSIS — E119 Type 2 diabetes mellitus without complications: Secondary | ICD-10-CM

## 2014-08-13 LAB — BASIC METABOLIC PANEL
BUN / CREAT RATIO: 19 (ref 11–26)
BUN: 25 mg/dL (ref 8–27)
CO2: 21 mmol/L (ref 18–29)
Calcium: 10 mg/dL (ref 8.7–10.3)
Chloride: 100 mmol/L (ref 97–108)
Creatinine, Ser: 1.31 mg/dL — ABNORMAL HIGH (ref 0.57–1.00)
GFR, EST AFRICAN AMERICAN: 44 mL/min/{1.73_m2} — AB (ref >59)
GFR, EST NON AFRICAN AMERICAN: 38 mL/min/{1.73_m2} — AB (ref >59)
GLUCOSE: 141 mg/dL — AB (ref 65–99)
Potassium: 5.7 mmol/L — ABNORMAL HIGH (ref 3.5–5.2)
Sodium: 138 mmol/L (ref 134–144)

## 2014-08-13 LAB — HEMOGLOBIN A1C
Est. average glucose Bld gHb Est-mCnc: 174 mg/dL
Hgb A1c MFr Bld: 7.7 % — ABNORMAL HIGH (ref 4.8–5.6)

## 2014-08-14 ENCOUNTER — Ambulatory Visit (INDEPENDENT_AMBULATORY_CARE_PROVIDER_SITE_OTHER): Payer: Medicare Other | Admitting: Internal Medicine

## 2014-08-14 ENCOUNTER — Encounter: Payer: Self-pay | Admitting: Internal Medicine

## 2014-08-14 VITALS — BP 122/80 | HR 80 | Temp 97.4°F | Resp 10 | Ht 63.0 in | Wt 157.0 lb

## 2014-08-14 DIAGNOSIS — E669 Obesity, unspecified: Secondary | ICD-10-CM

## 2014-08-14 DIAGNOSIS — B3731 Acute candidiasis of vulva and vagina: Secondary | ICD-10-CM

## 2014-08-14 DIAGNOSIS — E785 Hyperlipidemia, unspecified: Secondary | ICD-10-CM

## 2014-08-14 DIAGNOSIS — Z23 Encounter for immunization: Secondary | ICD-10-CM

## 2014-08-14 DIAGNOSIS — IMO0002 Reserved for concepts with insufficient information to code with codable children: Secondary | ICD-10-CM

## 2014-08-14 DIAGNOSIS — B373 Candidiasis of vulva and vagina: Secondary | ICD-10-CM

## 2014-08-14 DIAGNOSIS — M545 Low back pain, unspecified: Secondary | ICD-10-CM

## 2014-08-14 DIAGNOSIS — I350 Nonrheumatic aortic (valve) stenosis: Secondary | ICD-10-CM

## 2014-08-14 DIAGNOSIS — E1165 Type 2 diabetes mellitus with hyperglycemia: Secondary | ICD-10-CM

## 2014-08-14 DIAGNOSIS — I10 Essential (primary) hypertension: Secondary | ICD-10-CM

## 2014-08-14 NOTE — Progress Notes (Signed)
Patient ID: Danielle Benitez, female   DOB: 11/28/1931, 80 y.o.   MRN: [id number]    Facility  [facility]    Place of Service:   OFFICE   Allergies  Allergen Reactions  . Beta Adrenergic Blockers   . Fluzone [Flu Virus Vaccine] Other (See Comments)    Feeling unwell for a week. Cold symptoms/flu symtoms  . Tetanus Toxoids     Chief Complaint  Patient presents with  . Medical Management of Chronic Issues    3 month follow-up, discuss labs (copy printed)   . Vaginitis    Patient with yeast infection since starting new diabetic medication- patient reviewed data and this is a common side effect   . Immunizations    Discuss pneumonia vaccine- patient concerned about cost     HPI:  Diabetes mellitus type 2, uncontrolled: Improving   Dyslipidemia - folllow up Lipid panel next visit  Essential hypertension: Controlled  Aortic stenosis, NL LVF, 2D 01/09/12: Stable  Obese: no progress in weight loss  Midline low back pain without sciatica: stable  Vaginal yeast infection: Reports frequent yeast infections with itching since beginning Empaglifozin. Taking Diflucan prn which clears infection for a week at a time.   Hyperkalemia: K 5.7.    Medications: Patient's Medications  New Prescriptions   No medications on file  Previous Medications   ALLOPURINOL (ZYLOPRIM) 300 MG TABLET    Take 1 tablet (300 mg total) by mouth daily.   ASPIRIN 81 MG TABLET    Take 81 mg by mouth daily. Take 1 tablet once daily to prevent  heart attack, stroke and blood clot.   ATORVASTATIN (LIPITOR) 40 MG TABLET    Take 0.5 tablets (20 mg total) by mouth daily at 6 PM.   DULOXETINE (CYMBALTA) 20 MG CAPSULE    Take 1 tablet daily to help treat anxiety and nerves.   EMPAGLIFLOZIN (JARDIANCE) 10 MG TABS    One each morning to control glucose   FLUCONAZOLE (DIFLUCAN) 150 MG TABLET    Take 1 tablet (150 mg total) by mouth daily. Please take 1 time only for yeast infection   FUROSEMIDE (LASIX) 20 MG TABLET    Take one  tablet by mouth once daily as needed for diuretic   HYDROCODONE-ACETAMINOPHEN (NORCO/VICODIN) 5-325 MG PER TABLET    Take 1 tablet by mouth every 6 (six) hours as needed for pain. Take 1 tablet up to 4 times daily to control pain.   LISINOPRIL (PRINIVIL,ZESTRIL) 20 MG TABLET    Take one tablet by mouth once daily for blood pressure   METFORMIN (GLUCOPHAGE-XR) 500 MG 24 HR TABLET    Take two tablets by mouth each morning.   POTASSIUM CHLORIDE (K-DUR) 10 MEQ TABLET    Take 1 tablet (10 mEq total) by mouth daily.   TRIAMCINOLONE CREAM (KENALOG) 0.1 %    Apply sparingly twice daily to areas of itching  Modified Medications   No medications on file  Discontinued Medications   FLUCONAZOLE (DIFLUCAN) 100 MG TABLET    Take one tablet by mouth once daily for yeast infection   ZOSTER VACCINE LIVE, PF, (ZOSTAVAX) 78295 UNT/0.65ML INJECTION    Inject 19,400 Units into the skin once.     Review of Systems  Constitutional: Negative.   HENT: Negative.   Eyes:       Bilateral cataracts  Respiratory: Negative.   Cardiovascular: Negative for chest pain, palpitations and leg swelling.  Gastrointestinal: Negative.   Endocrine:  Diabetic  Genitourinary: Negative.   Musculoskeletal: Positive for back pain.  Skin: Positive for color change.       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 She is aware that she has some mild memory loss.  Hematological: Negative.   Psychiatric/Behavioral:       Prior hx of depressive symptoms.     Filed Vitals:   08/14/14 1214  BP: 122/80  Pulse: 80  Temp: 97.4 F (36.3 C)  TempSrc: Oral  Resp: 10  Height: 5\' 3"  (1.6 m)  Weight: 157 lb (71.215 kg)  SpO2: 99%   Body mass index is 27.82 kg/(m^2).  Physical Exam  Constitutional: She is oriented to person, place, and time.  Morbidly obese  HENT:  Head: Normocephalic and atraumatic.  Right Ear:  External ear normal.  Left Ear: External ear normal.  Nose: Nose normal.  Eyes:  Bilateral cataracts  Neck: Normal range of motion. Neck supple. No JVD present. No tracheal deviation present. No thyromegaly present.  Cardiovascular: Normal rate, regular rhythm and normal heart sounds.  Exam reveals no gallop and no friction rub.   No murmur heard. Pulses:      Dorsalis pedis pulses are 1+ on the right side, and 0 on the left side.       Posterior tibial pulses are 2+ on the right side, and 2+ on the left side.  Pacemaker  Pulmonary/Chest: Effort normal. No respiratory distress. She has no wheezes. She has rales. She exhibits no tenderness.  Pacemaker left upper chest wall  Abdominal: Soft. Bowel sounds are normal. She exhibits no distension and no mass.  Musculoskeletal: She exhibits edema.  Pain in the left shoulder with abduction and with rotation  Lymphadenopathy:    She has no cervical adenopathy.  Neurological: She is alert and oriented to person, place, and time. She has normal reflexes. No cranial nerve deficit. Coordination normal.  Decreased sensation to bilateral feet  Skin: No rash noted. No erythema. No pallor.  Eczematous lesions on arms  Psychiatric: She has a normal mood and affect. Her behavior is normal. Thought content normal.     Labs reviewed: Appointment on 08/12/2014  Component Date Value Ref Range Status  . Hgb A1c MFr Bld 08/12/2014 7.7* 4.8 - 5.6 % Final   Comment:          Pre-diabetes: 5.7 - 6.4          Diabetes: >6.4          Glycemic control for adults with diabetes: <7.0   . Est. average glucose Bld gHb Est-m* 08/12/2014 174   Final  . Glucose 08/12/2014 141* 65 - 99 mg/dL Final  . BUN 16/10/960411/11/2013 25  8 - 27 mg/dL Final  . Creatinine, Ser 08/12/2014 1.31* 0.57 - 1.00 mg/dL Final  . GFR calc non Af Amer 08/12/2014 38* >59 mL/min/1.73 Final  . GFR calc Af Amer 08/12/2014 44* >59 mL/min/1.73 Final  . BUN/Creatinine Ratio 08/12/2014 19  11 - 26 Final   . Sodium 08/12/2014 138  134 - 144 mmol/L Final  . Potassium 08/12/2014 5.7* 3.5 - 5.2 mmol/L Final  . Chloride 08/12/2014 100  97 - 108 mmol/L Final  . CO2 08/12/2014 21  18 - 29 mmol/L Final  . Calcium 08/12/2014 10.0  8.7 - 10.3 mg/dL Final     Assessment/Plan 1. Diabetes mellitus type 2,  uncontrolled A1c improving, down 0.6 points - Hemoglobin A1c; Future - Basic metabolic panel; Future - Microalbumin, urine; Future  2. Dyslipidemia - Lipid panel; Future  3. Essential hypertension Controlled  4. Aortic stenosis, NL LVF, 2D 01/09/12 Stable  5. Obese Diet counseling  6. Midline low back pain without sciatica Controlled on Hydrocodone-acetaminophen  7. Vaginal yeast infection Continue using Diflucan as needed Counseled that will improved as BSGs improve  8. Hyperkalemia Discontinue K-Dur

## 2014-09-19 ENCOUNTER — Encounter (HOSPITAL_COMMUNITY): Payer: Self-pay | Admitting: Internal Medicine

## 2014-10-14 ENCOUNTER — Other Ambulatory Visit: Payer: Self-pay | Admitting: *Deleted

## 2014-10-14 MED ORDER — METFORMIN HCL ER 500 MG PO TB24: ORAL_TABLET | ORAL | Status: DC

## 2014-10-14 NOTE — Telephone Encounter (Signed)
[facility] 

## 2014-10-16 ENCOUNTER — Encounter: Payer: Self-pay | Admitting: *Deleted

## 2014-11-05 ENCOUNTER — Telehealth: Payer: Self-pay | Admitting: Internal Medicine

## 2014-11-05 NOTE — Telephone Encounter (Signed)
11-05-14 called pt to set up dec pacer ck with taylor, pt to have dtr call to schedule/mt

## 2014-11-08 ENCOUNTER — Encounter: Payer: Self-pay | Admitting: *Deleted

## 2014-12-01 DIAGNOSIS — R001 Bradycardia, unspecified: Secondary | ICD-10-CM

## 2014-12-02 ENCOUNTER — Ambulatory Visit (INDEPENDENT_AMBULATORY_CARE_PROVIDER_SITE_OTHER): Payer: Self-pay | Admitting: *Deleted

## 2014-12-02 DIAGNOSIS — R001 Bradycardia, unspecified: Secondary | ICD-10-CM

## 2014-12-02 NOTE — Progress Notes (Signed)
Remote pacemaker transmission.   

## 2014-12-04 LAB — MDC_IDC_ENUM_SESS_TYPE_REMOTE
Brady Statistic AP VP Percent: 0.05 %
Brady Statistic AP VS Percent: 97.67 %
Brady Statistic RV Percent Paced: 0.06 %
Date Time Interrogation Session: 20160222021639
Lead Channel Impedance Value: 356 Ohm
Lead Channel Sensing Intrinsic Amplitude: 3.1219
Lead Channel Setting Pacing Amplitude: 2 V
MDC IDC MSMT BATTERY VOLTAGE: 2.99 V
MDC IDC MSMT LEADCHNL RA SENSING INTR AMPL: 2.8047
MDC IDC MSMT LEADCHNL RV IMPEDANCE VALUE: 536 Ohm
MDC IDC SET LEADCHNL RV PACING AMPLITUDE: 2.5 V
MDC IDC SET LEADCHNL RV PACING PULSEWIDTH: 0.4 ms
MDC IDC SET LEADCHNL RV SENSING SENSITIVITY: 0.9 mV
MDC IDC SET ZONE DETECTION INTERVAL: 350 ms
MDC IDC STAT BRADY AS VP PERCENT: 0 %
MDC IDC STAT BRADY AS VS PERCENT: 2.27 %
MDC IDC STAT BRADY RA PERCENT PACED: 97.73 %
Zone Setting Detection Interval: 400 ms

## 2014-12-05 ENCOUNTER — Encounter: Payer: Self-pay | Admitting: *Deleted

## 2014-12-06 ENCOUNTER — Other Ambulatory Visit: Payer: Self-pay | Admitting: *Deleted

## 2014-12-06 DIAGNOSIS — R0602 Shortness of breath: Secondary | ICD-10-CM

## 2014-12-06 MED ORDER — FUROSEMIDE 20 MG PO TABS: ORAL_TABLET | ORAL | Status: DC

## 2014-12-06 NOTE — Telephone Encounter (Signed)
[facility] 

## 2014-12-13 ENCOUNTER — Encounter: Payer: Self-pay | Admitting: Cardiology

## 2014-12-13 ENCOUNTER — Other Ambulatory Visit: Payer: Medicare Other

## 2014-12-16 ENCOUNTER — Other Ambulatory Visit: Payer: Self-pay

## 2014-12-16 DIAGNOSIS — IMO0002 Reserved for concepts with insufficient information to code with codable children: Secondary | ICD-10-CM

## 2014-12-16 DIAGNOSIS — E785 Hyperlipidemia, unspecified: Secondary | ICD-10-CM | POA: Diagnosis not present

## 2014-12-16 DIAGNOSIS — E1165 Type 2 diabetes mellitus with hyperglycemia: Secondary | ICD-10-CM | POA: Diagnosis not present

## 2014-12-17 LAB — LIPID PANEL
Chol/HDL Ratio: 2 ratio units (ref 0.0–4.4)
Cholesterol, Total: 116 mg/dL (ref 100–199)
HDL: 58 mg/dL (ref >39)
LDL CALC: 43 mg/dL (ref 0–99)
TRIGLYCERIDES: 77 mg/dL (ref 0–149)
VLDL CHOLESTEROL CAL: 15 mg/dL (ref 5–40)

## 2014-12-17 LAB — HEMOGLOBIN A1C
Est. average glucose Bld gHb Est-mCnc: 186 mg/dL
Hgb A1c MFr Bld: 8.1 % — ABNORMAL HIGH (ref 4.8–5.6)

## 2014-12-17 LAB — MICROALBUMIN, URINE: Microalbumin, Urine: 14.8 ug/mL (ref 0.0–17.0)

## 2014-12-17 LAB — BASIC METABOLIC PANEL
BUN / CREAT RATIO: 17 (ref 11–26)
BUN: 17 mg/dL (ref 8–27)
CO2: 18 mmol/L (ref 18–29)
CREATININE: 0.99 mg/dL (ref 0.57–1.00)
Calcium: 9.5 mg/dL (ref 8.7–10.3)
Chloride: 102 mmol/L (ref 97–108)
GFR, EST AFRICAN AMERICAN: 62 mL/min/{1.73_m2} (ref >59)
GFR, EST NON AFRICAN AMERICAN: 54 mL/min/{1.73_m2} — AB (ref >59)
Glucose: 125 mg/dL — ABNORMAL HIGH (ref 65–99)
Potassium: 4.8 mmol/L (ref 3.5–5.2)
SODIUM: 136 mmol/L (ref 134–144)

## 2014-12-18 ENCOUNTER — Encounter: Payer: Self-pay | Admitting: Internal Medicine

## 2014-12-18 ENCOUNTER — Ambulatory Visit (INDEPENDENT_AMBULATORY_CARE_PROVIDER_SITE_OTHER): Payer: Medicare Other | Admitting: Internal Medicine

## 2014-12-18 VITALS — BP 122/68 | HR 72 | Temp 98.1°F | Resp 20 | Ht 63.0 in | Wt 158.2 lb

## 2014-12-18 DIAGNOSIS — I2510 Atherosclerotic heart disease of native coronary artery without angina pectoris: Secondary | ICD-10-CM | POA: Diagnosis not present

## 2014-12-18 DIAGNOSIS — E669 Obesity, unspecified: Secondary | ICD-10-CM

## 2014-12-18 DIAGNOSIS — R1013 Epigastric pain: Secondary | ICD-10-CM

## 2014-12-18 DIAGNOSIS — M25561 Pain in right knee: Secondary | ICD-10-CM | POA: Diagnosis not present

## 2014-12-18 DIAGNOSIS — E1165 Type 2 diabetes mellitus with hyperglycemia: Secondary | ICD-10-CM | POA: Diagnosis not present

## 2014-12-18 DIAGNOSIS — M25562 Pain in left knee: Secondary | ICD-10-CM

## 2014-12-18 DIAGNOSIS — I10 Essential (primary) hypertension: Secondary | ICD-10-CM | POA: Diagnosis not present

## 2014-12-18 DIAGNOSIS — IMO0002 Reserved for concepts with insufficient information to code with codable children: Secondary | ICD-10-CM

## 2014-12-18 DIAGNOSIS — M25569 Pain in unspecified knee: Secondary | ICD-10-CM | POA: Insufficient documentation

## 2014-12-18 DIAGNOSIS — K30 Functional dyspepsia: Secondary | ICD-10-CM | POA: Insufficient documentation

## 2014-12-18 DIAGNOSIS — E785 Hyperlipidemia, unspecified: Secondary | ICD-10-CM

## 2014-12-18 MED ORDER — ONETOUCH DELICA LANCETS FINE MISC: Status: DC

## 2014-12-18 MED ORDER — HYDROCODONE-ACETAMINOPHEN 5-325 MG PO TABS: ORAL_TABLET | ORAL | Status: DC

## 2014-12-18 MED ORDER — ESOMEPRAZOLE MAGNESIUM 40 MG PO CPDR: DELAYED_RELEASE_CAPSULE | ORAL | Status: DC

## 2014-12-18 MED ORDER — GLUCOSE BLOOD VI STRP: ORAL_STRIP | Status: DC

## 2014-12-18 NOTE — Patient Instructions (Addendum)
Follow a proper Diet. Avoid sweets ands starches.

## 2014-12-18 NOTE — Progress Notes (Signed)
Patient ID: Danielle Benitez, female   DOB: 27-Jul-1932, 81 y.o.   MRN: [id number]    Facility  [facility]    Place of Service:   OFFICE   Allergies  Allergen Reactions  . Beta Adrenergic Blockers   . Fluzone [Flu Virus Vaccine] Other (See Comments)    Feeling unwell for a week. Cold symptoms/flu symtoms  . Tetanus Toxoids     Chief Complaint  Patient presents with  . Medical Management of Chronic Issues    4 month follow up, Discuss labs (copy printed)  . Gastrophageal Reflux    Patient c/o burping and indigestion  . Leg Problem    Patient c/o legs cramps , right is worse  . Urinary Incontinence    Patient c/o Lovenia Shuck issues 2x months or longer    HPI:  Pain in the feet and legs.  Diabetes mellitus type 2, uncontrolled - recent labs showed A1c 8.1 and fasting glucose 125. She is noncompliant with her diet.  Dyslipidemia: Controlled  Coronary artery disease involving native coronary artery of native heart without angina pectoris: Stable and without angina  Obese: She is not really making an effort to lose weight. Weight is stable.  Essential hypertension - controlled  Arthralgia of both knees - chronic and unchanged. Benefits from HYDROcodone-acetaminophen (NORCO/VICODIN) 5-325 MG per tablet  Indigestion - occurring 3 or 4 times weekly.    Medications: Patient's Medications  New Prescriptions   No medications on file  Previous Medications   ALLOPURINOL (ZYLOPRIM) 300 MG TABLET    Take 1 tablet (300 mg total) by mouth daily.   ASPIRIN 81 MG TABLET    Take 81 mg by mouth daily. Take 1 tablet once daily to prevent  heart attack, stroke and blood clot.   ATORVASTATIN (LIPITOR) 40 MG TABLET    Take 0.5 tablets (20 mg total) by mouth daily at 6 PM.   DULOXETINE (CYMBALTA) 20 MG CAPSULE    Take 1 tablet daily to help treat anxiety and nerves.   EMPAGLIFLOZIN (JARDIANCE) 10 MG TABS    One each morning to control glucose   FLUCONAZOLE (DIFLUCAN) 150 MG TABLET    Take 1 tablet (150 mg  total) by mouth daily. Please take 1 time only for yeast infection   FUROSEMIDE (LASIX) 20 MG TABLET    Take one tablet by mouth once daily as needed for diuretic   HYDROCODONE-ACETAMINOPHEN (NORCO/VICODIN) 5-325 MG PER TABLET    Take 1 tablet by mouth every 6 (six) hours as needed for pain. Take 1 tablet up to 4 times daily to control pain.   LISINOPRIL (PRINIVIL,ZESTRIL) 20 MG TABLET    Take one tablet by mouth once daily for blood pressure   METFORMIN (GLUCOPHAGE-XR) 500 MG 24 HR TABLET    Take two tablets by mouth each morning to control blood sugar   TRIAMCINOLONE CREAM (KENALOG) 0.1 %    Apply sparingly twice daily to areas of itching  Modified Medications   Modified Medication Previous Medication   GLUCOSE BLOOD TEST STRIP glucose blood test strip      Check blood sugar daily as directed DX E11.65    Check blood sugar daily as directed DX E11.65   ONETOUCH DELICA LANCETS FINE MISC ONETOUCH DELICA LANCETS FINE MISC      Check blood sugar daily as directed DX E11.65    by Does not apply route. Check blood sugar daily as directed DX E11.65  Discontinued Medications   POTASSIUM CHLORIDE (K-DUR) 10 MEQ  TABLET    Take 1 tablet (10 mEq total) by mouth daily.     Review of Systems  Constitutional: Negative.   HENT: Negative.   Eyes:       Bilateral cataracts  Respiratory: Negative.   Cardiovascular: Positive for leg swelling. Negative for chest pain and palpitations.  Gastrointestinal:       Burping and reflux  Endocrine:       Diabetic  Genitourinary: Positive for frequency.       Urge incontinence  Musculoskeletal: Positive for back pain.       Knee pains. Shoulder pains. Some ba ck pains.  Skin: Positive for color change.       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 She is aware that she has some mild memory loss.  Hematological: Negative.   Psychiatric/Behavioral:  Positive for sleep disturbance. The patient is nervous/anxious.        Prior hx of depressive symptoms.     Filed Vitals:   12/18/14 1312  BP: 122/68  Pulse: 72  Temp: 98.1 F (36.7 C)  TempSrc: Oral  Resp: 20  Height:  (1.6 m)  Weight: 158 lb 3.2 oz (71.759 kg)  SpO2: 96%   Body mass index is 28.03 kg/(m^2).  Physical Exam  Constitutional: She is oriented to person, place, and time.  Morbidly obese  HENT:  Head: Normocephalic and atraumatic.  Right Ear: External ear normal.  Left Ear: External ear normal.  Nose: Nose normal.  Eyes:  Bilateral cataracts  Neck: Normal range of motion. Neck supple. No JVD present. No tracheal deviation present. No thyromegaly present.  Cardiovascular: Normal rate, regular rhythm and normal heart sounds.  Exam reveals no gallop and no friction rub.   No murmur heard. Pulses:      Dorsalis pedis pulses are 1+ on the right side, and 0 on the left side.       Posterior tibial pulses are 2+ on the right side, and 2+ on the left side.  Pacemaker Diminished DP AND PT bilaterally  Pulmonary/Chest: Effort normal. No respiratory distress. She has no wheezes. She has rales. She exhibits no tenderness.  Pacemaker left upper chest wall  Abdominal: Soft. Bowel sounds are normal. She exhibits no distension and no mass.  Musculoskeletal: She exhibits edema.  Pain in the left shoulder with abduction and with rotation  Lymphadenopathy:    She has no cervical adenopathy.  Neurological: She is alert and oriented to person, place, and time. She has normal reflexes. No cranial nerve deficit. Coordination normal.  Decreased sensation to bilateral feet  Skin: No rash noted. No erythema. No pallor.  Eczematous lesions on arms  Psychiatric: She has a normal mood and affect. Her behavior is normal. Thought content normal.     Labs reviewed: Appointment on 12/16/2014  Component Date Value Ref Range Status  . Hgb A1c MFr Bld 12/16/2014 8.1* 4.8 - 5.6 %  Final   Comment:          Pre-diabetes: 5.7 - 6.4          Diabetes: >6.4          Glycemic control for adults with diabetes: <7.0   . Est. average glucose Bld gHb Est-m* 12/16/2014 186   Final  . Glucose 12/16/2014 125* 65 - 99 mg/dL Final  . BUN 16/07/9603 17  8 -  27 mg/dL Final  . Creatinine, Ser 12/16/2014 0.99  0.57 - 1.00 mg/dL Final  . GFR calc non Af Amer 12/16/2014 54* >59 mL/min/1.73 Final  . GFR calc Af Amer 12/16/2014 62  >59 mL/min/1.73 Final  . BUN/Creatinine Ratio 12/16/2014 17  11 - 26 Final  . Sodium 12/16/2014 136  134 - 144 mmol/L Final  . Potassium 12/16/2014 4.8  3.5 - 5.2 mmol/L Final  . Chloride 12/16/2014 102  97 - 108 mmol/L Final  . CO2 12/16/2014 18  18 - 29 mmol/L Final  . Calcium 12/16/2014 9.5  8.7 - 10.3 mg/dL Final  . Cholesterol, Total 12/16/2014 116  100 - 199 mg/dL Final  . Triglycerides 12/16/2014 77  0 - 149 mg/dL Final  . HDL 16/07/9603 58  >39 mg/dL Final   Comment: According to ATP-III Guidelines, HDL-C >59 mg/dL is considered a negative risk factor for CHD.   Marland Kitchen VLDL Cholesterol Cal 12/16/2014 15  5 - 40 mg/dL Final  . LDL Calculated 12/16/2014 43  0 - 99 mg/dL Final  . Chol/HDL Ratio 12/16/2014 2.0  0.0 - 4.4 ratio units Final   Comment:                                   T. Chol/HDL Ratio                                             Men  Women                               1/2 Avg.Risk  3.4    3.3                                   Avg.Risk  5.0    4.4                                2X Avg.Risk  9.6    7.1                                3X Avg.Risk 23.4   11.0   . Microalbum.,U,Random 12/16/2014 14.8  0.0 - 17.0 ug/mL Final  Clinical Support on 12/02/2014  Component Date Value Ref Range Status  . Date Time Interrogation Session 12/04/2014 [id number]   Final  . Pulse Generator Manufacturer 12/04/2014 Medtronic   Final  . Pulse Gen Model 12/04/2014 RVDR01 Revo MRI   Final  . Pulse Gen Serial Number 12/04/2014 [id number]   Final  . RV  Sense Sensitivity 12/04/2014 0.9   Final  . RA Pace Amplitude 12/04/2014 2   Final  . RV Pace PulseWidth 12/04/2014 0.4   Final  . RV Pace Amplitude 12/04/2014 2.5   Final  . Zone Setting Type Category 12/04/2014 VF   Final  . Zone Setting Type Category 12/04/2014 VT   Final  . Zone Setting Type Category 12/04/2014 VENTRICULAR_TACHYCARDIA_1   Final  . Zone Setting Type Category 12/04/2014 VENTRICULAR_TACHYCARDIA_2   Final  . Zone Detect Interval 12/04/2014 400   Final  .  Zone Setting Type Category 12/04/2014 ATRIAL_FIBRILLATION   Final  . Zone Setting Type Category 12/04/2014 ATAF   Final  . Zone Detect Interval 12/04/2014 350   Final  . RA Impedance 12/04/2014 356   Final  . RA Amplitude 12/04/2014 2.80474   Final  . RV IMPEDANCE 12/04/2014 536   Final  . RV Amplitude 12/04/2014 3.12192   Final  . Battery Status 12/04/2014 OK   Final  . Battery Voltage 12/04/2014 2.99   Final  . Huston Foley RA Perc Paced 12/04/2014 97.73   Final  . Huston Foley RV Perc Paced 12/04/2014 0.06   Final  . Huston Foley AP VP Percent 12/04/2014 0.05   Final  . Huston Foley AS VP Percent 12/04/2014 0   Final  . Huston Foley AP VS Percent 12/04/2014 97.67   Final  . Huston Foley AS VS Percent 12/04/2014 2.27   Final  . Eval Rhythm 12/04/2014 APVS   Final  . Miscellaneous Comment 12/04/2014    Final                   Value:Pacemaker remote check. Device function reviewed. Impedance and sensing consistent with previous measurements. Histograms appropriate for patient and level of activity. All other diagnostic data reviewed and is appropriate and stable for patient. Real  time EGM shows appropriate sensing and capture. 1 AT/AF episode x 1hr 46 mins, Max Avg A 278, Max Avg  V 109  + ASA. 2 Fast A&V episodes---max dur. 12 sec, Max A/V 167. 4 "NSVT" episodes---max dur. 2 sec, Max Avg V 181---AT per EGMs. Batt voltage 2.99V  (ERI 2.81V) Plan to follow up with [facility] in 02-2015 (overdue).      Assessment/Plan 1. Diabetes mellitus type 2,  uncontrolled Discussed dietary compliance. Patient says she knows she can do better. No medication changes today. - Microalbumin, urine; Future - Hemoglobin A1c; Future - Basic metabolic panel; Future  2. Dyslipidemia - Lipid panel; Future  3. Coronary artery disease involving native coronary artery of native heart without angina pectoris Stable  4. Obese Work on weight loss. Diet discussed.  5. Essential hypertension Controlled - Basic metabolic panel; Future  6. Arthralgia of both knees Unchanged - HYDROcodone-acetaminophen (NORCO/VICODIN) 5-325 MG per tablet; Take 1 tablet up to 4 times daily to control pain.  Dispense: 30 tablet; Refill: 0  7. Indigestion Add Nexium - esomeprazole (NEXIUM) 40 MG capsule; One daily to reduce stomach acid  Dispense: 30 capsule; Refill: 3

## 2014-12-27 ENCOUNTER — Encounter: Payer: Self-pay | Admitting: Cardiology

## 2015-01-21 ENCOUNTER — Telehealth: Payer: Self-pay | Admitting: *Deleted

## 2015-01-21 NOTE — Telephone Encounter (Signed)
Patient called and stated that she is having a reaction to the new medication Jardiance. Patient states she cannot hold her urine, she puts a pad on but it is so bad it soaks it and her cloths and it is getting embarrassing, itching all over and yeast infections back and back. Would like it changed to something else. Please Advise.

## 2015-01-23 MED ORDER — SAXAGLIPTIN HCL 5 MG PO TABS: ORAL_TABLET | ORAL | Status: DC

## 2015-01-23 NOTE — Telephone Encounter (Signed)
Patient Notified and agreed. Faxed medication to pharmacy and added to allergy list.

## 2015-01-23 NOTE — Telephone Encounter (Signed)
Stop Jardiance. Start Onglyza 5 mg daily to control diabetes. Disp 30 tabs with 5 refills

## 2015-02-05 DIAGNOSIS — E119 Type 2 diabetes mellitus without complications: Secondary | ICD-10-CM | POA: Diagnosis not present

## 2015-02-18 ENCOUNTER — Encounter: Payer: Self-pay | Admitting: Internal Medicine

## 2015-02-18 ENCOUNTER — Ambulatory Visit (INDEPENDENT_AMBULATORY_CARE_PROVIDER_SITE_OTHER): Payer: Medicare Other | Admitting: Internal Medicine

## 2015-02-18 ENCOUNTER — Other Ambulatory Visit: Payer: Self-pay

## 2015-02-18 VITALS — BP 150/68 | HR 78 | Ht 63.0 in | Wt 168.2 lb

## 2015-02-18 DIAGNOSIS — R001 Bradycardia, unspecified: Secondary | ICD-10-CM

## 2015-02-18 DIAGNOSIS — J449 Chronic obstructive pulmonary disease, unspecified: Secondary | ICD-10-CM

## 2015-02-18 DIAGNOSIS — Z950 Presence of cardiac pacemaker: Secondary | ICD-10-CM

## 2015-02-18 DIAGNOSIS — IMO0001 Reserved for inherently not codable concepts without codable children: Secondary | ICD-10-CM

## 2015-02-18 DIAGNOSIS — I10 Essential (primary) hypertension: Secondary | ICD-10-CM

## 2015-02-18 LAB — CUP PACEART INCLINIC DEVICE CHECK
Battery Voltage: 2.99 V
Brady Statistic AP VP Percent: 0.08 %
Brady Statistic AP VS Percent: 97.43 %
Brady Statistic RA Percent Paced: 97.51 %
Brady Statistic RV Percent Paced: 0.09 %
Date Time Interrogation Session: 20160510171121
Lead Channel Impedance Value: 336 Ohm
Lead Channel Impedance Value: 464 Ohm
Lead Channel Pacing Threshold Amplitude: 1 V
Lead Channel Pacing Threshold Amplitude: 1 V
Lead Channel Pacing Threshold Pulse Width: 0.4 ms
Lead Channel Sensing Intrinsic Amplitude: 2.4282
Lead Channel Sensing Intrinsic Amplitude: 2.8924
Lead Channel Setting Pacing Amplitude: 2.5 V
Lead Channel Setting Pacing Pulse Width: 0.4 ms
Lead Channel Setting Sensing Sensitivity: 0.9 mV
MDC IDC MSMT LEADCHNL RV PACING THRESHOLD PULSEWIDTH: 0.4 ms
MDC IDC SET LEADCHNL RA PACING AMPLITUDE: 2 V
MDC IDC SET ZONE DETECTION INTERVAL: 350 ms
MDC IDC SET ZONE DETECTION INTERVAL: 400 ms
MDC IDC STAT BRADY AS VP PERCENT: 0 %
MDC IDC STAT BRADY AS VS PERCENT: 2.48 %

## 2015-02-18 NOTE — Progress Notes (Signed)
HPI Danielle Benitez returns today for followup. She is a very pleasant elderly woman with a history of symptomatic bradycardia, status post permanent pacemaker insertion. She notes fatigue and weakness with activity. She denies syncope. She has minimal peripheral edema. She denies chest pain or shortness of breath. She is very sedentary, and not particularly active. She had no other complaints today however. Allergies  Allergen Reactions  . Beta Adrenergic Blockers     Couldn't speak or hear  . Jardiance [Empagliflozin]     Rash, kidney issues  . Fluzone [Flu Virus Vaccine] Other (See Comments)    Feeling unwell for a week. Cold symptoms/flu symtoms  . Tetanus Toxoids     UNKNOWN     Current Outpatient Prescriptions  Medication Sig Dispense Refill  . allopurinol (ZYLOPRIM) 300 MG tablet Take 1 tablet (300 mg total) by mouth daily. 90 tablet 3  . aspirin 81 MG tablet Take 81 mg by mouth daily. Take 1 tablet once daily to prevent  heart attack, stroke and blood clot.    Marland Kitchen atorvastatin (LIPITOR) 40 MG tablet Take 0.5 tablets (20 mg total) by mouth daily at 6 PM. 90 tablet 3  . DULoxetine (CYMBALTA) 20 MG capsule Take 1 tablet daily to help treat anxiety and nerves. (Patient taking differently: Take 1 tablet by mouth daily to help treat anxiety and nerves.) 90 capsule 3  . furosemide (LASIX) 20 MG tablet Take one tablet by mouth once daily as needed for diuretic 30 tablet 3  . glucose blood test strip Check blood sugar daily as directed DX E11.65 100 each 3  . HYDROcodone-acetaminophen (NORCO/VICODIN) 5-325 MG per tablet Take 1 tablet up to 4 times daily to control pain. (Patient taking differently: Take 1 tablet by mouth up to 4 times daily to control pain.) 30 tablet 0  . lisinopril (PRINIVIL,ZESTRIL) 20 MG tablet Take one tablet by mouth once daily for blood pressure 90 tablet 3  . metFORMIN (GLUCOPHAGE-XR) 500 MG 24 hr tablet Take two tablets by mouth each morning to control blood sugar 180 tablet  1  . ONETOUCH DELICA LANCETS FINE MISC Check blood sugar daily as directed DX E11.65 100 each 3  . saxagliptin HCl (ONGLYZA) 5 MG TABS tablet Take one tablet by mouth once daily for blood sugar 30 tablet 5  . triamcinolone cream (KENALOG) 0.1 % Apply sparingly twice daily to areas of itching (Patient taking differently: 1 application. Apply sparingly twice daily to areas of itching) 30 g 3   No current facility-administered medications for this visit.     Past Medical History  Diagnosis Date  . Diabetes mellitus   . Hypertension   . Gout   . Osteoarthritis   . Bursitis   . S/P cardiac pacemaker procedure, PPM Medtronic REVO placed 01/11/2012 01/11/2012  . Personal history of fall   . Chronic kidney disease, stage II (mild)   . Memory loss   . Anxiety state, unspecified   . Other and unspecified hyperlipidemia   . Anemia, unspecified   . Abnormality of gait   . Shortness of breath   . Depressive disorder, not elsewhere classified   . Rheumatism, unspecified and fibrositis   . Lumbago   . Other nonspecific abnormal serum enzyme levels   . Nonspecific abnormal results of liver function study   . Obesity, unspecified   . Internal hemorrhoids without mention of complication   . Viremia, unspecified   . Osteoarthrosis, unspecified whether generalized or localized, unspecified site   .  Abnormality of gait   . Viremia, unspecified   . Lumbago   . Rheumatism, unspecified and fibrositis   . Pain in joint, shoulder region 11/22/2013    leeft   . Pruritus 11/22/2013  . Eczema 11/22/2013  . Full dentures   . HOH (hard of hearing)   . Wears glasses     ROS:   All systems reviewed and negative except as noted in the HPI.   Past Surgical History  Procedure Laterality Date  . Extremity cyst excision    . Cardiac catherterization  12/03/2003    Dr Clarene Duke  . Pacemaker placement  2013  . Colonoscopy    . Orif wrist fracture Right 04/09/2014    Procedure: OPEN REDUCTION INTERNAL  FIXATION (ORIF) RIGHT DISPLACED  DISTAL RADIUS  FRACTURE;  Surgeon: Dominica Severin, MD;  Location: [facility];  Service: Orthopedics;  Laterality: Right;  . Left heart catheterization with coronary angiogram N/A 01/10/2012    Procedure: LEFT HEART CATHETERIZATION WITH CORONARY ANGIOGRAM;  Surgeon: Chrystie Nose, MD;  Location: [facility] CATH LAB;  Service: Cardiovascular;  Laterality: N/A;  . Permanent pacemaker insertion Left 01/11/2012    Procedure: PERMANENT PACEMAKER INSERTION;  Surgeon: Thurmon Fair, MD;  Location: [facility] CATH LAB;  Service: Cardiovascular;  Laterality: Left;     Family History  Problem Relation Age of Onset  . Breast cancer Mother   . Cancer Mother   . Diabetes Daughter   . Hypertension Daughter   . Cancer Brother      History   Social History  . Marital Status: Widowed    Spouse Name: N/A  . Number of Children: N/A  . Years of Education: N/A   Occupational History  . Not on file.   Social History Main Topics  . Smoking status: Never Smoker   . Smokeless tobacco: Not on file  . Alcohol Use: No  . Drug Use: No  . Sexual Activity: Not on file   Other Topics Concern  . Not on file   Social History Narrative     BP 150/68 mmHg  Pulse 78  Ht  (1.6 m)  Wt 168 lb 3.2 oz (76.295 kg)  BMI 29.80 kg/m2  Physical Exam:  Well appearing elderly woman,NAD HEENT: Unremarkable Neck:  7 cm JVD, no thyromegally Lungs:  Clear with no wheezes, rales, or rhonchi. Well-healed pacemaker incision. HEART:  Regular rate rhythm, no murmurs, no rubs, no clicks Abd:  soft, positive bowel sounds, no organomegally, no rebound, no guarding Ext:  2 plus pulses, no edema, no cyanosis, no clubbing Skin:  No rashes no nodules Neuro:  CN II through XII intact, motor grossly intact   DEVICE  Normal device function.  See PaceArt for details.   Assess/Plan:

## 2015-02-18 NOTE — Patient Instructions (Addendum)
Medication Instructions:  Your physician recommends that you continue on your current medications as directed. Please refer to the Current Medication list given to you today.   Labwork: None ordered  Testing/Procedures: None ordered  Follow-Up:  Your physician wants you to follow-up in: 12 months with Dr Court Joy will receive a reminder letter in the mail two months in advance. If you don't receive a letter, please call our office to schedule the follow-up appointment.  Remote monitoring is used to monitor your Pacemaker of ICD from home. This monitoring reduces the number of office visits required to check your device to one time per year. It allows [place] to keep an eye on the functioning of your device to ensure it is working properly. You are scheduled for a device check from home on 05/20/15. You may send your transmission at any time that day. If you have a wireless device, the transmission will be sent automatically. After your physician reviews your transmission, you will receive a postcard with your next transmission date.    Any Other Special Instructions Will Be Listed Below (If Applicable).

## 2015-02-18 NOTE — Assessment & Plan Note (Signed)
She will continue her current medications. She is well compensated.

## 2015-02-18 NOTE — Assessment & Plan Note (Signed)
Her Medtronic MR compatible pacemaker is working normally. We'll plan to recheck in several months.

## 2015-02-18 NOTE — Assessment & Plan Note (Signed)
Her systolic blood pressure is slightly elevated. No change in medications.

## 2015-02-21 DIAGNOSIS — H2511 Age-related nuclear cataract, right eye: Secondary | ICD-10-CM | POA: Diagnosis not present

## 2015-02-21 DIAGNOSIS — H2512 Age-related nuclear cataract, left eye: Secondary | ICD-10-CM | POA: Diagnosis not present

## 2015-02-21 DIAGNOSIS — H02839 Dermatochalasis of unspecified eye, unspecified eyelid: Secondary | ICD-10-CM | POA: Diagnosis not present

## 2015-02-21 DIAGNOSIS — H18411 Arcus senilis, right eye: Secondary | ICD-10-CM | POA: Diagnosis not present

## 2015-02-21 DIAGNOSIS — H18412 Arcus senilis, left eye: Secondary | ICD-10-CM | POA: Diagnosis not present

## 2015-03-18 ENCOUNTER — Other Ambulatory Visit: Payer: Self-pay | Admitting: *Deleted

## 2015-03-18 DIAGNOSIS — R0602 Shortness of breath: Secondary | ICD-10-CM

## 2015-03-18 MED ORDER — LISINOPRIL 20 MG PO TABS: ORAL_TABLET | ORAL | Status: DC

## 2015-03-31 DIAGNOSIS — H2512 Age-related nuclear cataract, left eye: Secondary | ICD-10-CM | POA: Diagnosis not present

## 2015-03-31 DIAGNOSIS — H25812 Combined forms of age-related cataract, left eye: Secondary | ICD-10-CM | POA: Diagnosis not present

## 2015-04-01 DIAGNOSIS — H2511 Age-related nuclear cataract, right eye: Secondary | ICD-10-CM | POA: Diagnosis not present

## 2015-04-01 DIAGNOSIS — H25011 Cortical age-related cataract, right eye: Secondary | ICD-10-CM | POA: Diagnosis not present

## 2015-04-01 DIAGNOSIS — H25041 Posterior subcapsular polar age-related cataract, right eye: Secondary | ICD-10-CM | POA: Diagnosis not present

## 2015-04-08 DIAGNOSIS — H2513 Age-related nuclear cataract, bilateral: Secondary | ICD-10-CM | POA: Diagnosis not present

## 2015-04-09 ENCOUNTER — Other Ambulatory Visit: Payer: Self-pay | Admitting: *Deleted

## 2015-04-09 MED ORDER — METFORMIN HCL ER 500 MG PO TB24: ORAL_TABLET | ORAL | Status: DC

## 2015-04-09 NOTE — Telephone Encounter (Signed)
[facility] 

## 2015-04-11 DIAGNOSIS — H25811 Combined forms of age-related cataract, right eye: Secondary | ICD-10-CM | POA: Diagnosis not present

## 2015-04-11 DIAGNOSIS — H2511 Age-related nuclear cataract, right eye: Secondary | ICD-10-CM | POA: Diagnosis not present

## 2015-04-21 ENCOUNTER — Other Ambulatory Visit: Payer: Medicare Other

## 2015-04-28 ENCOUNTER — Other Ambulatory Visit: Payer: Medicare Other

## 2015-04-28 DIAGNOSIS — E1165 Type 2 diabetes mellitus with hyperglycemia: Secondary | ICD-10-CM

## 2015-04-28 DIAGNOSIS — IMO0002 Reserved for concepts with insufficient information to code with codable children: Secondary | ICD-10-CM

## 2015-04-28 DIAGNOSIS — E785 Hyperlipidemia, unspecified: Secondary | ICD-10-CM | POA: Diagnosis not present

## 2015-04-29 ENCOUNTER — Ambulatory Visit: Payer: Medicare Other | Admitting: Internal Medicine

## 2015-04-29 LAB — BASIC METABOLIC PANEL
BUN / CREAT RATIO: 17 (ref 11–26)
BUN: 18 mg/dL (ref 8–27)
CHLORIDE: 104 mmol/L (ref 97–108)
CO2: 21 mmol/L (ref 18–29)
CREATININE: 1.05 mg/dL — AB (ref 0.57–1.00)
Calcium: 9.3 mg/dL (ref 8.7–10.3)
GFR calc Af Amer: 58 mL/min/{1.73_m2} — ABNORMAL LOW (ref >59)
GFR calc non Af Amer: 50 mL/min/{1.73_m2} — ABNORMAL LOW (ref >59)
Glucose: 101 mg/dL — ABNORMAL HIGH (ref 65–99)
POTASSIUM: 5 mmol/L (ref 3.5–5.2)
Sodium: 140 mmol/L (ref 134–144)

## 2015-04-29 LAB — LIPID PANEL
CHOL/HDL RATIO: 2.1 ratio (ref 0.0–4.4)
CHOLESTEROL TOTAL: 115 mg/dL (ref 100–199)
HDL: 55 mg/dL (ref >39)
LDL Calculated: 48 mg/dL (ref 0–99)
Triglycerides: 61 mg/dL (ref 0–149)
VLDL CHOLESTEROL CAL: 12 mg/dL (ref 5–40)

## 2015-04-29 LAB — HEMOGLOBIN A1C
ESTIMATED AVERAGE GLUCOSE: 163 mg/dL
Hgb A1c MFr Bld: 7.3 % — ABNORMAL HIGH (ref 4.8–5.6)

## 2015-04-29 LAB — MICROALBUMIN, URINE: Microalbumin, Urine: 160.3 ug/mL

## 2015-04-30 ENCOUNTER — Ambulatory Visit: Payer: Medicare Other | Admitting: Internal Medicine

## 2015-05-06 ENCOUNTER — Encounter: Payer: Self-pay | Admitting: Internal Medicine

## 2015-05-06 ENCOUNTER — Ambulatory Visit (INDEPENDENT_AMBULATORY_CARE_PROVIDER_SITE_OTHER): Payer: Medicare Other | Admitting: Internal Medicine

## 2015-05-06 VITALS — BP 132/82 | HR 91 | Temp 98.4°F | Resp 20 | Ht 63.0 in | Wt 167.4 lb

## 2015-05-06 DIAGNOSIS — R0602 Shortness of breath: Secondary | ICD-10-CM

## 2015-05-06 DIAGNOSIS — E785 Hyperlipidemia, unspecified: Secondary | ICD-10-CM

## 2015-05-06 DIAGNOSIS — I2510 Atherosclerotic heart disease of native coronary artery without angina pectoris: Secondary | ICD-10-CM | POA: Diagnosis not present

## 2015-05-06 DIAGNOSIS — M25561 Pain in right knee: Secondary | ICD-10-CM | POA: Diagnosis not present

## 2015-05-06 DIAGNOSIS — N393 Stress incontinence (female) (male): Secondary | ICD-10-CM | POA: Diagnosis not present

## 2015-05-06 DIAGNOSIS — R1013 Epigastric pain: Secondary | ICD-10-CM

## 2015-05-06 DIAGNOSIS — IMO0001 Reserved for inherently not codable concepts without codable children: Secondary | ICD-10-CM

## 2015-05-06 DIAGNOSIS — E1165 Type 2 diabetes mellitus with hyperglycemia: Secondary | ICD-10-CM

## 2015-05-06 DIAGNOSIS — J449 Chronic obstructive pulmonary disease, unspecified: Secondary | ICD-10-CM

## 2015-05-06 DIAGNOSIS — E669 Obesity, unspecified: Secondary | ICD-10-CM | POA: Diagnosis not present

## 2015-05-06 DIAGNOSIS — F329 Major depressive disorder, single episode, unspecified: Secondary | ICD-10-CM

## 2015-05-06 DIAGNOSIS — IMO0002 Reserved for concepts with insufficient information to code with codable children: Secondary | ICD-10-CM

## 2015-05-06 DIAGNOSIS — M25562 Pain in left knee: Secondary | ICD-10-CM

## 2015-05-06 DIAGNOSIS — L299 Pruritus, unspecified: Secondary | ICD-10-CM

## 2015-05-06 DIAGNOSIS — E1129 Type 2 diabetes mellitus with other diabetic kidney complication: Secondary | ICD-10-CM

## 2015-05-06 DIAGNOSIS — I10 Essential (primary) hypertension: Secondary | ICD-10-CM

## 2015-05-06 DIAGNOSIS — K30 Functional dyspepsia: Secondary | ICD-10-CM

## 2015-05-06 DIAGNOSIS — F32A Depression, unspecified: Secondary | ICD-10-CM

## 2015-05-06 HISTORY — DX: Stress incontinence (female) (male): N39.3

## 2015-05-06 MED ORDER — FLUTICASONE FUROATE-VILANTEROL 200-25 MCG/INH IN AEPB: 1.0000 | INHALATION_SPRAY | Freq: Once | RESPIRATORY_TRACT | Status: DC

## 2015-05-06 NOTE — Progress Notes (Signed)
Patient ID: Danielle Benitez, female   DOB: [birth year], 81 y.o.   MRN: [id number]    Facility  [facility]    Place of Service:   OFFICE    Allergies  Allergen Reactions  . Beta Adrenergic Blockers     Couldn't speak or hear  . Jardiance [Empagliflozin]     Rash, kidney issues  . Fluzone [Flu Virus Vaccine] Other (See Comments)    Feeling unwell for a week. Cold symptoms/flu symtoms  . Tetanus Toxoids     UNKNOWN    Chief Complaint  Patient presents with  . Medical Management of Chronic Issues    HPI:  Cataracts removed by Dr. Vonna Kotyk on 03/31/2015 and 04/11/2015.  DM (diabetes mellitus), type 2, uncontrolled, with renal complications - noncompliant with diet, but she does take her medication regularly  Obese - dietary noncompliance  Dyslipidemia - controlled  Essential hypertension - controlled  COPD bronchitis by CXR -increasing shortness of breath, particularly on exertion.  SOB (shortness of breath) - worse with exertion  Female stress incontinence - persistent problem, but she does not want further urologic referrals.  Coronary artery disease involving native coronary artery of native heart without angina pectoris - episodes of palpitations, but no chest pains.  Depression - persistent low-grade depression. No crying, but she feels worthless much of the time.  Pruritus - improved  Arthralgia of both knees - inhibits activity  Indigestion - heartburn and reflux frequently    Medications: Patient's Medications  New Prescriptions   No medications on file  Previous Medications   ALLOPURINOL (ZYLOPRIM) 300 MG TABLET    Take 1 tablet (300 mg total) by mouth daily.   ASPIRIN 81 MG TABLET    Take 81 mg by mouth daily. Take 1 tablet once daily to prevent  heart attack, stroke and blood clot.   ATORVASTATIN (LIPITOR) 40 MG TABLET    Take 0.5 tablets (20 mg total) by mouth daily at 6 PM.   DULOXETINE (CYMBALTA) 20 MG CAPSULE    Take 1 tablet daily to help treat anxiety and  nerves.   FUROSEMIDE (LASIX) 20 MG TABLET    Take one tablet by mouth once daily as needed for diuretic   GLUCOSE BLOOD TEST STRIP    Check blood sugar daily as directed DX E11.65   HYDROCODONE-ACETAMINOPHEN (NORCO/VICODIN) 5-325 MG PER TABLET    Take 1 tablet up to 4 times daily to control pain.   ILEVRO 0.3 % OPHTHALMIC SUSPENSION    Place 1 drop into the right eye at bedtime.   LISINOPRIL (PRINIVIL,ZESTRIL) 20 MG TABLET    Take one tablet by mouth once daily for blood pressure   METFORMIN (GLUCOPHAGE-XR) 500 MG 24 HR TABLET    Take two tablets by mouth each morning to control blood sugar   ONETOUCH DELICA LANCETS FINE MISC    Check blood sugar daily as directed DX E11.65   SAXAGLIPTIN HCL (ONGLYZA) 5 MG TABS TABLET    Take one tablet by mouth once daily for blood sugar   TRIAMCINOLONE CREAM (KENALOG) 0.1 %    Apply sparingly twice daily to areas of itching  Modified Medications   No medications on file  Discontinued Medications   No medications on file     Review of Systems  Constitutional:       Obese  HENT: Negative.   Eyes:       Bilateral cataracts  Respiratory: Positive for shortness of breath (On walking).   Cardiovascular: Positive for  palpitations and leg swelling. Negative for chest pain.  Gastrointestinal:       Burping and reflux  Endocrine:       Diabetic  Genitourinary: Positive for frequency.       Urge incontinence  Musculoskeletal: Positive for back pain.       Knee pains. Shoulder pains. Some back pains.  Skin: Positive for color change.       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 She is aware that she has some mild memory loss.  Hematological: Negative.   Psychiatric/Behavioral: Positive for sleep disturbance. The patient is nervous/anxious.        Prior hx of depressive symptoms.     Filed Vitals:   05/06/15 1541  BP: 132/82  Pulse: 91    Temp: 98.4 F (36.9 C)  TempSrc: Oral  Resp: 20  Height: 5\' 3"  (1.6 m)  Weight: 167 lb 6.4 oz (75.932 kg)  SpO2: 96%   Body mass index is 29.66 kg/(m^2).  Physical Exam  Constitutional: She is oriented to person, place, and time.  Morbidly obese  HENT:  Head: Normocephalic and atraumatic.  Right Ear: External ear normal.  Left Ear: External ear normal.  Nose: Nose normal.  Eyes:  Bilateral cataracts  Neck: Normal range of motion. Neck supple. No JVD present. No tracheal deviation present. No thyromegaly present.  Cardiovascular: Normal rate, regular rhythm and normal heart sounds.  Exam reveals no gallop and no friction rub.   No murmur heard. Pulses:      Dorsalis pedis pulses are 1+ on the right side, and 0 on the left side.       Posterior tibial pulses are 2+ on the right side, and 2+ on the left side.  Pacemaker Diminished DP AND PT bilaterally  Pulmonary/Chest: Effort normal. No respiratory distress. She has no wheezes. She has rales. She exhibits no tenderness.  Pacemaker left upper chest wall. Dyspnea on exertion  Abdominal: Soft. Bowel sounds are normal. She exhibits no distension and no mass.  Musculoskeletal: She exhibits edema.  Pain in the left shoulder with abduction and with rotation  Lymphadenopathy:    She has no cervical adenopathy.  Neurological: She is alert and oriented to person, place, and time. She has normal reflexes. No cranial nerve deficit. Coordination normal.  Decreased sensation to bilateral feet. 05/06/15 6CIT score 3/28  Skin: No rash noted. No erythema. No pallor.  Eczematous lesions on arms  Psychiatric: She has a normal mood and affect. Her behavior is normal. Thought content normal.     Labs reviewed: Appointment on 04/28/2015  Component Date Value Ref Range Status  . Microalbum.,U,Random 04/28/2015 160.3  Not Estab. ug/mL Final  . Hgb A1c MFr Bld 04/28/2015 7.3* 4.8 - 5.6 % Final   Comment:          Pre-diabetes: 5.7 - 6.4           Diabetes: >6.4          Glycemic control for adults with diabetes: <7.0   . Est. average glucose Bld gHb Est-m* 04/28/2015 163   Final  . Glucose 04/28/2015 101* 65 - 99 mg/dL Final  . BUN 96/75/9163 18  8 - 27 mg/dL Final  . Creatinine, Ser 04/28/2015 1.05* 0.57 - 1.00 mg/dL Final  . GFR calc non Af Amer 04/28/2015 50* >59 mL/min/1.73 Final  . GFR  calc Af Amer 04/28/2015 58* >59 mL/min/1.73 Final  . BUN/Creatinine Ratio 04/28/2015 17  11 - 26 Final  . Sodium 04/28/2015 140  134 - 144 mmol/L Final  . Potassium 04/28/2015 5.0  3.5 - 5.2 mmol/L Final  . Chloride 04/28/2015 104  97 - 108 mmol/L Final  . CO2 04/28/2015 21  18 - 29 mmol/L Final  . Calcium 04/28/2015 9.3  8.7 - 10.3 mg/dL Final  . Cholesterol, Total 04/28/2015 115  100 - 199 mg/dL Final  . Triglycerides 04/28/2015 61  0 - 149 mg/dL Final  . HDL 40/98/1191 55  >39 mg/dL Final   Comment: According to ATP-III Guidelines, HDL-C >59 mg/dL is considered a negative risk factor for CHD.   Marland Kitchen VLDL Cholesterol Cal 04/28/2015 12  5 - 40 mg/dL Final  . LDL Calculated 04/28/2015 48  0 - 99 mg/dL Final  . Chol/HDL Ratio 04/28/2015 2.1  0.0 - 4.4 ratio units Final   Comment:                                   T. Chol/HDL Ratio                                             Men  Women                               1/2 Avg.Risk  3.4    3.3                                   Avg.Risk  5.0    4.4                                2X Avg.Risk  9.6    7.1                                3X Avg.Risk 23.4   11.0   Office Visit on 02/18/2015  Component Date Value Ref Range Status  . Date Time Interrogation Session 02/18/2015 [id number]   Preliminary  . Pulse Generator Manufacturer 02/18/2015 MERM   Preliminary  . Pulse Gen Model 02/18/2015 [id number] Revo MRI   Preliminary  . Pulse Gen Serial Number 02/18/2015 [id number]   Preliminary  . Implantable Pulse Generator Type 02/18/2015 Implantable Pulse Generator   Preliminary  . Implantable Pulse  Generator Implan* 02/18/2015 [id number]   Preliminary  . Lead Channel Setting Sensing Sensi* 02/18/2015 0.9   Preliminary  . Lead Channel Setting Pacing Amplit* 02/18/2015 2   Preliminary  . Lead Channel Setting Pacing Pulse * 02/18/2015 0.4   Preliminary  . Lead Channel Setting Pacing Amplit* 02/18/2015 2.5   Preliminary  . Zone Setting Type Category 02/18/2015 VF   Preliminary  . Zone Setting Type Category 02/18/2015 VT   Preliminary  . Zone Setting Type Category 02/18/2015 VENTRICULAR_TACHYCARDIA_1   Preliminary  . Zone Setting Type Category 02/18/2015 VENTRICULAR_TACHYCARDIA_2   Preliminary  . Zone Setting Detection Interval 02/18/2015 400   Preliminary  . Zone Setting Type Category 02/18/2015 ATRIAL_FIBRILLATION  Preliminary  . Zone Setting Type Category 02/18/2015 ATAF   Preliminary  . Zone Setting Detection Interval 02/18/2015 350   Preliminary  . Lead Channel Impedance Value 02/18/2015 336   Preliminary  . Lead Channel Sensing Intrinsic Amp* 02/18/2015 4.09811   Preliminary  . Lead Channel Pacing Threshold Ampl* 02/18/2015 1   Preliminary  . Lead Channel Pacing Threshold Puls* 02/18/2015 0.4   Preliminary  . Lead Channel Impedance Value 02/18/2015 464   Preliminary  . Lead Channel Sensing Intrinsic Amp* 02/18/2015 9.14782   Preliminary  . Lead Channel Pacing Threshold Ampl* 02/18/2015 1   Preliminary  . Lead Channel Pacing Threshold Puls* 02/18/2015 0.4   Preliminary  . Battery Status 02/18/2015 OK   Preliminary  . Battery Voltage 02/18/2015 2.99   Preliminary  . Huston Foley Statistic RA Percent Paced 02/18/2015 97.51   Preliminary  . Huston Foley Statistic RV Percent Paced 02/18/2015 0.09   Preliminary  . Huston Foley Statistic AP VP Percent 02/18/2015 0.08   Preliminary  . [place]on Foley Statistic AS VP Percent 02/18/2015 0   Preliminary  . H[place]on Foley Statistic AP VS Percent 02/18/2015 97.43   Preliminary  . Huston Foley Statistic AS VS Percent 02/18/2015 2.48   Preliminary  . Eval Rhythm 02/18/2015 SB 48    Preliminary     Assessment/Plan  1. DM (diabetes mellitus), type 2, uncontrolled, with renal complications Lose weight - Hemoglobin A1c; Future - Basic metabolic panel; Future  2. Obese Lose weight . Diet discussed  3. Dyslipidemia controlled  4. Essential hypertension controlled - Basic metabolic panel; Future  5. COPD bronchitis by CXR add - Fluticasone Furoate-Vilanterol (BREO ELLIPTA) 200-25 MCG/INH AEPB; Inhale 1 puff into the lungs once. daily to help breathing.  Dispense: 1 each; Refill: 0  6. SOB (shortness of breath) Try Breo  7. Female stress incontinence No new orders  8. Coronary artery disease involving native coronary artery of native heart without angina pectoris stable  9. Depression Continue duloxetine  10. Pruritus improved  11. Arthralgia of both knees Continue Tylenol  12. Indigestion Try Tums or ranitidine

## 2015-05-07 ENCOUNTER — Telehealth: Payer: Self-pay | Admitting: Internal Medicine

## 2015-05-07 ENCOUNTER — Other Ambulatory Visit: Payer: Self-pay | Admitting: Internal Medicine

## 2015-05-07 DIAGNOSIS — E1129 Type 2 diabetes mellitus with other diabetic kidney complication: Secondary | ICD-10-CM

## 2015-05-07 DIAGNOSIS — IMO0002 Reserved for concepts with insufficient information to code with codable children: Secondary | ICD-10-CM

## 2015-05-07 DIAGNOSIS — E1165 Type 2 diabetes mellitus with hyperglycemia: Principal | ICD-10-CM

## 2015-05-07 NOTE — Telephone Encounter (Signed)
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 She is concerned about her mom's labs. She wants to know why her mom's numbers on the protein and kidney function are so high. Colin Mulders ask that you call her at [phone] ext [phone]. She will be at this number until she goes to lunch at 1:00 pm and will return at 2:00. Her cell number is [phone]. She is very concerned.

## 2015-05-07 NOTE — Telephone Encounter (Signed)
I returned call to [facility]. The lab tests and concern was an elevation in the urine microalbumin on a random specimen. It went from the last value of 14.8 up to the current value of 160 mcg/mL. I advised her, as I told the patient yesterday, that the elevation feels certain is related to the effects of diabetes and blood pressure on her kidneys. There are other possibilities, but I don't think she needs referral to a nephrologist at this time. Lab tests will be repeated prior to her next office appointment.

## 2015-05-14 ENCOUNTER — Encounter: Payer: Self-pay | Admitting: Internal Medicine

## 2015-05-20 ENCOUNTER — Encounter: Payer: Medicare Other | Admitting: *Deleted

## 2015-05-20 ENCOUNTER — Telehealth: Payer: Self-pay | Admitting: Cardiology

## 2015-05-20 NOTE — Telephone Encounter (Signed)
Spoke with pt and reminded pt of remote transmission that is due today. Pt verbalized understanding.   

## 2015-05-21 ENCOUNTER — Encounter: Payer: Self-pay | Admitting: Cardiology

## 2015-05-26 ENCOUNTER — Ambulatory Visit (INDEPENDENT_AMBULATORY_CARE_PROVIDER_SITE_OTHER): Payer: Medicare Other | Admitting: *Deleted

## 2015-05-26 ENCOUNTER — Encounter: Payer: Self-pay | Admitting: Internal Medicine

## 2015-05-26 DIAGNOSIS — R001 Bradycardia, unspecified: Secondary | ICD-10-CM

## 2015-05-27 NOTE — Progress Notes (Signed)
Remote pacemaker transmission.   

## 2015-05-29 LAB — CUP PACEART REMOTE DEVICE CHECK
Battery Voltage: 2.99 V
Brady Statistic AP VP Percent: 10.05 %
Brady Statistic AP VS Percent: 88.13 %
Brady Statistic AS VP Percent: 0.1 %
Brady Statistic AS VS Percent: 1.72 %
Brady Statistic RV Percent Paced: 10.16 %
Lead Channel Impedance Value: 328 Ohm
Lead Channel Impedance Value: 448 Ohm
Lead Channel Sensing Intrinsic Amplitude: 1.928 mV
Lead Channel Setting Pacing Amplitude: 2.5 V
Lead Channel Setting Pacing Pulse Width: 0.4 ms
MDC IDC SESS DTM: 20160815224159
MDC IDC SET LEADCHNL RA PACING AMPLITUDE: 2 V
MDC IDC SET LEADCHNL RV SENSING SENSITIVITY: 0.9 mV
MDC IDC SET ZONE DETECTION INTERVAL: 350 ms
MDC IDC STAT BRADY RA PERCENT PACED: 98.18 %
Zone Setting Detection Interval: 400 ms

## 2015-06-12 ENCOUNTER — Encounter: Payer: Self-pay | Admitting: Cardiology

## 2015-06-13 ENCOUNTER — Encounter: Payer: Self-pay | Admitting: Cardiovascular Disease

## 2015-06-17 ENCOUNTER — Encounter: Payer: Self-pay | Admitting: Cardiovascular Disease

## 2015-06-19 ENCOUNTER — Encounter: Payer: Self-pay | Admitting: Nurse Practitioner

## 2015-06-19 ENCOUNTER — Ambulatory Visit (INDEPENDENT_AMBULATORY_CARE_PROVIDER_SITE_OTHER): Payer: Medicare Other | Admitting: Nurse Practitioner

## 2015-06-19 VITALS — BP 130/78 | HR 73 | Temp 98.2°F | Resp 14 | Ht 63.0 in | Wt 171.0 lb

## 2015-06-19 DIAGNOSIS — K219 Gastro-esophageal reflux disease without esophagitis: Secondary | ICD-10-CM | POA: Diagnosis not present

## 2015-06-19 DIAGNOSIS — Z23 Encounter for immunization: Secondary | ICD-10-CM

## 2015-06-19 DIAGNOSIS — J449 Chronic obstructive pulmonary disease, unspecified: Secondary | ICD-10-CM | POA: Diagnosis not present

## 2015-06-19 DIAGNOSIS — IMO0001 Reserved for inherently not codable concepts without codable children: Secondary | ICD-10-CM

## 2015-06-19 MED ORDER — ALBUTEROL SULFATE HFA 108 (90 BASE) MCG/ACT IN AERS: 2.0000 | INHALATION_SPRAY | Freq: Four times a day (QID) | RESPIRATORY_TRACT | Status: DC | PRN

## 2015-06-19 MED ORDER — MONTELUKAST SODIUM 10 MG PO TABS: 10.0000 mg | ORAL_TABLET | Freq: Every day | ORAL | Status: DC

## 2015-06-19 MED ORDER — PANTOPRAZOLE SODIUM 40 MG PO TBEC: 40.0000 mg | DELAYED_RELEASE_TABLET | Freq: Every day | ORAL | Status: DC

## 2015-06-19 NOTE — Progress Notes (Signed)
Patient ID: Danielle Benitez, female   DOB: 1931-12-16, 81 y.o.   MRN: [id number]    PCP: Kimber Relic, MD  Allergies  Allergen Reactions  . Beta Adrenergic Blockers     Couldn't speak or hear  . Jardiance [Empagliflozin]     Rash, kidney issues  . Fluzone [Flu Virus Vaccine] Other (See Comments)    Feeling unwell for a week. Cold symptoms/flu symtoms  . Tetanus Toxoids     UNKNOWN    Chief Complaint  Patient presents with  . Acute Visit    Chest Congestion, head congestion, cough, sneezing, drainage, nasal sores, and SOB      HPI: Patient is a 81 y.o. female seen in the office today for shortness of breath that has not improved since last visit in May with Dr Chilton Si. Waking up at night having hard time breathing but also hard time breathing during the day at times. Coughing up sputum throughout the day. Never smoked.  Has been having increased shortness of breath for some time. Sneezing now. Hx of bronchitis by chest xray. Added breo ellipta sample, used sample and it did not help at all.  No fevers or chills. Increase fatigue over the last few months. Nothing acute.  More shortness of breath with exertion.  LV EF: 50% - 55% in November 2014 Does have swelling in lower extremities. Takes lasix, but only a few times a week, for lower extremities swelling   Has acid reflux that is not controlled. Stopped taking her nexium because it did not help but Protonix has helped  Review of Systems:  Review of Systems  Constitutional: Negative for activity change, appetite change, fatigue and unexpected weight change.  HENT: Negative for congestion.   Eyes: Negative.   Respiratory: Positive for shortness of breath (with activity). Negative for cough.   Cardiovascular: Negative for chest pain, palpitations and leg swelling.  Gastrointestinal: Negative for abdominal pain, diarrhea and constipation.       GERD symptoms  Musculoskeletal: Negative for myalgias and arthralgias.  Skin: Negative  for color change.  Neurological: Negative for dizziness and weakness.    Past Medical History  Diagnosis Date  . Diabetes mellitus   . Hypertension   . Gout   . Osteoarthritis   . Bursitis   . S/P cardiac pacemaker procedure, PPM Medtronic REVO placed 01/11/2012 01/11/2012  . Personal history of fall   . Chronic kidney disease, stage II (mild)   . Memory loss   . Anxiety state, unspecified   . Other and unspecified hyperlipidemia   . Anemia, unspecified   . Abnormality of gait   . Shortness of breath   . Depressive disorder, not elsewhere classified   . Rheumatism, unspecified and fibrositis   . Lumbago   . Other nonspecific abnormal serum enzyme levels   . Nonspecific abnormal results of liver function study   . Obesity, unspecified   . Internal hemorrhoids without mention of complication   . Viremia, unspecified   . Osteoarthrosis, unspecified whether generalized or localized, unspecified site   . Abnormality of gait   . Viremia, unspecified   . Lumbago   . Rheumatism, unspecified and fibrositis   . Pain in joint, shoulder region 11/22/2013    leeft   . Pruritus 11/22/2013  . Eczema 11/22/2013  . Full dentures   . HOH (hard of hearing)   . Wears glasses   . Female stress incontinence 05/06/2015  . DM (diabetes mellitus), type 2, uncontrolled, with renal  complications 01/08/2012   Past Surgical History  Procedure Laterality Date  . Extremity cyst excision    . Cardiac catherterization  12/03/2003    Dr Clarene Duke  . Pacemaker placement  2013  . Colonoscopy    . Orif wrist fracture Right 04/09/2014    Procedure: OPEN REDUCTION INTERNAL FIXATION (ORIF) RIGHT DISPLACED  DISTAL RADIUS  FRACTURE;  Surgeon: Dominica Severin, MD;  Location: [facility];  Service: Orthopedics;  Laterality: Right;  . Left heart catheterization with coronary angiogram N/A 01/10/2012    Procedure: LEFT HEART CATHETERIZATION WITH CORONARY ANGIOGRAM;  Surgeon: Chrystie Nose, MD;  Location[facility]  CATH LAB;  Service: Cardiovascular;  Laterality: N/A;  . Permanent pacemaker insertion Left 01/11/2012    Procedure: PERMANENT PACEMAKER INSERTION;  Surgeon: Thurmon Fair, MD;  Location: [facility] CATH LAB;  Service: Cardiovascular;  Laterality: Left;   Social History:   reports that she has never smoked. She does not have any smokeless tobacco history on file. She reports that she does not drink alcohol or use illicit drugs.  Family History  Problem Relation Age of Onset  . Breast cancer Mother   . Cancer Mother   . Diabetes Daughter   . Hypertension Daughter   . Cancer Brother     Medications: Patient's Medications  New Prescriptions   No medications on file  Previous Medications   ALLOPURINOL (ZYLOPRIM) 300 MG TABLET    Take 1 tablet (300 mg total) by mouth daily.   ASPIRIN 81 MG TABLET    Take 81 mg by mouth daily. Take 1 tablet once daily to prevent  heart attack, stroke and blood clot.   ATORVASTATIN (LIPITOR) 40 MG TABLET    Take 0.5 tablets (20 mg total) by mouth daily at 6 PM.   DULOXETINE (CYMBALTA) 20 MG CAPSULE    Take 1 tablet daily to help treat anxiety and nerves.   FUROSEMIDE (LASIX) 20 MG TABLET    Take one tablet by mouth once daily as needed for diuretic   GLUCOSE BLOOD TEST STRIP    Check blood sugar daily as directed DX E11.65   HYDROCODONE-ACETAMINOPHEN (NORCO/VICODIN) 5-325 MG PER TABLET    Take 1 tablet up to 4 times daily to control pain.   LISINOPRIL (PRINIVIL,ZESTRIL) 20 MG TABLET    Take one tablet by mouth once daily for blood pressure   METFORMIN (GLUCOPHAGE-XR) 500 MG 24 HR TABLET    Take two tablets by mouth each morning to control blood sugar   ONETOUCH DELICA LANCETS FINE MISC    Check blood sugar daily as directed DX E11.65   SAXAGLIPTIN HCL (ONGLYZA) 5 MG TABS TABLET    Take one tablet by mouth once daily for blood sugar   TRIAMCINOLONE CREAM (KENALOG) 0.1 %    Apply sparingly twice daily to areas of itching  Modified Medications   No medications on file   Discontinued Medications   FLUTICASONE FUROATE-VILANTEROL (BREO ELLIPTA) 200-25 MCG/INH AEPB    Inhale 1 puff into the lungs once. daily to help breathing.   ILEVRO 0.3 % OPHTHALMIC SUSPENSION    Place 1 drop into the right eye at bedtime.     Physical Exam:  Filed Vitals:   06/19/15 1638  BP: 130/78  Pulse: 73  Temp: 98.2 F (36.8 C)  TempSrc: Oral  Resp: 14  Height: 5\' 3"  (1.6 m)  Weight: 171 lb (77.565 kg)  SpO2: 90%    Physical Exam  Constitutional: She is oriented to person, place,  and time. She appears well-developed and well-nourished. No distress.  HENT:  Head: Normocephalic and atraumatic.  Neck: Normal range of motion. Neck supple. No JVD present.  Cardiovascular: Normal rate, regular rhythm and normal heart sounds.   Pulmonary/Chest: Effort normal and breath sounds normal.  Abdominal: Soft. Bowel sounds are normal.  Musculoskeletal: She exhibits no edema.  Neurological: She is alert and oriented to person, place, and time.  Skin: Skin is warm and dry. She is not diaphoretic.  Psychiatric: She has a normal mood and affect.    Labs reviewed: Basic Metabolic Panel:  Recent Labs  16/10/96 0903 12/16/14 0858 04/28/15 0857  NA 138 136 140  K 5.7* 4.8 5.0  CL 100 102 104  CO2 GLUCOSE 141* 125* 101*  BUN CREATININE 1.31* 0.99 1.05*  CALCIUM 10.0 9.5 9.3   Liver Function Tests: No results for input(s): AST, ALT, ALKPHOS, BILITOT, PROT, ALBUMIN in the last 8760 hours. No results for input(s): LIPASE, AMYLASE in the last 8760 hours. No results for input(s): AMMONIA in the last 8760 hours. CBC: No results for input(s): WBC, NEUTROABS, HGB, HCT, MCV, PLT in the last 8760 hours. Lipid Panel:  Recent Labs  12/16/14 0858 04/28/15 0857  CHOL 116 115  HDL 58 55  LDLCALC 43 48  TRIG 77 61  CHOLHDL 2.0 2.1   TSH: No results for input(s): TSH in the last 8760 hours. A1C: Lab Results  Component Value Date   HGBA1C 7.3* 04/28/2015       Assessment/Plan  1. COPD bronchitis by CXR -breo elipta not effective so stopped using worsening symptoms over the last 6 months.  - albuterol (PROVENTIL HFA;VENTOLIN HFA) 108 (90 BASE) MCG/ACT inhaler; Inhale 2 puffs into the lungs every 6 (six) hours as needed for wheezing or shortness of breath.  Dispense: 1 Inhaler; Refill: 0 - Start montelukast (SINGULAIR) 10 MG tablet; Take 1 tablet (10 mg total) by mouth at bedtime.  Dispense: 30 tablet; Refill: 3 - DG Chest 2 View to follow up due to worsening symptoms  2. Gastroesophageal reflux disease without esophagitis -uncontrolled GERD which could be making COPD symptoms worse - pantoprazole (PROTONIX) 40 MG tablet; Take 1 tablet (40 mg total) by mouth daily.  Dispense: 30 tablet; Refill: 3  3. Encounter for immunization Influenza Given today.  To keep follow up with Dr Chilton Si in 1 month, sooner if needed  Janene Harvey. Biagio Borg  [facility] [phone] 8 am - 5 pm) [phone] (after hours)

## 2015-06-19 NOTE — Patient Instructions (Signed)
Take lasix daily for the next week then as needed  May use mucinex DM twice daily as needed for cough and congestion- take with full glass of water  To start Protonix for acid reflux which may be making shortness of breath worse  To start Singulair 10 mg daily   To use albuterol inhaler as needed for cough or shortness of breath

## 2015-06-20 ENCOUNTER — Other Ambulatory Visit: Payer: Self-pay | Admitting: *Deleted

## 2015-06-20 MED ORDER — ALLOPURINOL 300 MG PO TABS: 300.0000 mg | ORAL_TABLET | Freq: Every day | ORAL | Status: DC

## 2015-06-20 MED ORDER — DULOXETINE HCL 20 MG PO CPEP: ORAL_CAPSULE | ORAL | Status: DC

## 2015-06-20 MED ORDER — ATORVASTATIN CALCIUM 40 MG PO TABS: 20.0000 mg | ORAL_TABLET | Freq: Every day | ORAL | Status: DC

## 2015-06-20 NOTE — Telephone Encounter (Signed)
[facility] 

## 2015-06-24 ENCOUNTER — Telehealth: Payer: Self-pay

## 2015-06-24 NOTE — Telephone Encounter (Signed)
Spoke with patient, patient states she is doing better and does not feel the need to get x-ray right now. Patient states if her symptoms return she will then consider x-ray.  Message forwarded to [facility] as a FYI

## 2015-06-24 NOTE — Telephone Encounter (Signed)
-----   Message from Sharon Seller, NP sent at 06/24/2015  8:41 AM EDT ----- Please call pt and remind of chest xray  ----- Message -----    From: SYSTEM    Sent: 06/20/2015  12:04 AM      To: Sharon Seller, NP

## 2015-06-26 ENCOUNTER — Encounter: Payer: Self-pay | Admitting: Cardiology

## 2015-07-10 ENCOUNTER — Telehealth: Payer: Self-pay | Admitting: Internal Medicine

## 2015-07-10 NOTE — Telephone Encounter (Signed)
Ms.Guzzi's daughter [name] called on 07/10/2015 and stated that Ms.Brogan is going next week to get her x-ray done.

## 2015-07-11 ENCOUNTER — Ambulatory Visit
Admission: RE | Admit: 2015-07-11 | Discharge: 2015-07-11 | Disposition: A | Discharge status: Home / Self Care | Payer: Medicare Other | Source: Ambulatory Visit | Attending: Nurse Practitioner | Admitting: Nurse Practitioner

## 2015-07-11 ENCOUNTER — Telehealth: Payer: Self-pay | Admitting: *Deleted

## 2015-07-11 DIAGNOSIS — R05 Cough: Secondary | ICD-10-CM | POA: Diagnosis not present

## 2015-07-11 DIAGNOSIS — I480 Paroxysmal atrial fibrillation: Secondary | ICD-10-CM

## 2015-07-11 HISTORY — DX: Paroxysmal atrial fibrillation: I48.0

## 2015-07-11 NOTE — Telephone Encounter (Signed)
No pt will need to be seen for that, please offer another OV or can go to urgent care if she can not wait - pts PCP is Dr Chilton Si I saw her several weeks ago, also order chest xray and she did not get it done.

## 2015-07-11 NOTE — Telephone Encounter (Signed)
Spoke with daughter [name] and she stated patient will not go to Urgent Care and stated that she won't go and have the chest xray done either. She scheduled an appointment with Dr. Chilton Si Tuesday. I offered a Monday appointment but she wanted to go with Dr. Chilton Si. I instructed her that if patient worsens or new symptoms to take her to ER or Urgent Care. She agreed.

## 2015-07-11 NOTE — Telephone Encounter (Signed)
Daughter, [name] called and stated that patient has been taking her allergy medication and inhaler you prescribed but is no better, she is worse. Would like to know if you will call in an antibiotic. Please Advise.

## 2015-07-12 DIAGNOSIS — I219 Acute myocardial infarction, unspecified: Secondary | ICD-10-CM

## 2015-07-12 HISTORY — DX: Acute myocardial infarction, unspecified: I21.9

## 2015-07-15 ENCOUNTER — Encounter: Payer: Self-pay | Admitting: Internal Medicine

## 2015-07-15 ENCOUNTER — Ambulatory Visit (INDEPENDENT_AMBULATORY_CARE_PROVIDER_SITE_OTHER): Payer: Medicare Other | Admitting: Internal Medicine

## 2015-07-15 VITALS — BP 118/76 | HR 95 | Temp 97.6°F | Resp 14 | Ht 63.0 in | Wt 173.0 lb

## 2015-07-15 DIAGNOSIS — I319 Disease of pericardium, unspecified: Secondary | ICD-10-CM | POA: Diagnosis not present

## 2015-07-15 DIAGNOSIS — R0602 Shortness of breath: Secondary | ICD-10-CM

## 2015-07-15 DIAGNOSIS — I517 Cardiomegaly: Secondary | ICD-10-CM

## 2015-07-15 DIAGNOSIS — I3139 Other pericardial effusion (noninflammatory): Secondary | ICD-10-CM

## 2015-07-15 DIAGNOSIS — I313 Pericardial effusion (noninflammatory): Secondary | ICD-10-CM | POA: Insufficient documentation

## 2015-07-15 MED ORDER — FUROSEMIDE 40 MG PO TABS: ORAL_TABLET | ORAL | Status: DC

## 2015-07-15 MED ORDER — SPIRONOLACTONE 25 MG PO TABS: ORAL_TABLET | ORAL | Status: DC

## 2015-07-15 NOTE — Progress Notes (Signed)
Patient ID: Danielle Benitez, female   DOB: Mar 24, 1932, 81 y.o.   MRN: [id number]    Facility  [facility]    Place of Service:   OFFICE    Allergies  Allergen Reactions  . Beta Adrenergic Blockers     Couldn't speak or hear  . Jardiance [Empagliflozin]     Rash, kidney issues  . Fluzone [Flu Virus Vaccine] Other (See Comments)    Feeling unwell for a week. Cold symptoms/flu symtoms  . Tetanus Toxoids     UNKNOWN    Chief Complaint  Patient presents with  . URI    Cold symptoms: Drainage, SOB, wheezing, no appetitie, fatigue, and headaches. Symptoms ongoing from last OV   . Irregular Heart Beat    FYI fast pulse:112-130, patient was taking mucinex DM. Cardiology was contacted.   . Orders    Discuss the need for Bone Density     HPI:  Patient was seen at our office on 06/19/15 by Sherrie Mustache, NP. She is complaining of a persistent cough and dyspnea. Subsequent x-ray showed an enlarging cardiac silhouette and the radiologist felt there was a possibility of pericardial effusion. There was also cephalization of vasculature. She continues to be short of breath. She is in a wheelchair today which is something new for her. She denies any blood in the sputum. Sputum general he is clear to white. There have been no fevers. There is chronic peripheral edema which doesn't seem any worse to her than in the past.   Medications: Patient's Medications  New Prescriptions   No medications on file  Previous Medications   ALBUTEROL (PROVENTIL HFA;VENTOLIN HFA) 108 (90 BASE) MCG/ACT INHALER    Inhale 2 puffs into the lungs every 6 (six) hours as needed for wheezing or shortness of breath.   ALLOPURINOL (ZYLOPRIM) 300 MG TABLET    Take 1 tablet (300 mg total) by mouth daily.   ASPIRIN 81 MG TABLET    Take 81 mg by mouth daily. Take 1 tablet once daily to prevent  heart attack, stroke and blood clot.   ATORVASTATIN (LIPITOR) 40 MG TABLET    Take 0.5 tablets (20 mg total) by mouth daily at 6 PM.   DULOXETINE (CYMBALTA) 20 MG CAPSULE    Take 1 tablet by mouth daily to help treat anxiety and nerves.   FUROSEMIDE (LASIX) 20 MG TABLET    Take one tablet by mouth once daily as needed for diuretic   GLUCOSE BLOOD TEST STRIP    Check blood sugar daily as directed DX E11.65   HYDROCODONE-ACETAMINOPHEN (NORCO/VICODIN) 5-325 MG PER TABLET    Take 1 tablet up to 4 times daily to control pain.   LISINOPRIL (PRINIVIL,ZESTRIL) 20 MG TABLET    Take one tablet by mouth once daily for blood pressure   METFORMIN (GLUCOPHAGE-XR) 500 MG 24 HR TABLET    Take two tablets by mouth each morning to control blood sugar   MONTELUKAST (SINGULAIR) 10 MG TABLET    Take 1 tablet (10 mg total) by mouth at bedtime.   ONETOUCH DELICA LANCETS FINE MISC    Check blood sugar daily as directed DX E11.65   PANTOPRAZOLE (PROTONIX) 40 MG TABLET    Take 1 tablet (40 mg total) by mouth daily.   SAXAGLIPTIN HCL (ONGLYZA) 5 MG TABS TABLET    Take one tablet by mouth once daily for blood sugar   TRIAMCINOLONE CREAM (KENALOG) 0.1 %    Apply sparingly twice daily to areas of itching  Modified Medications   No medications on file  Discontinued Medications   No medications on file    Review of Systems  Constitutional:       Obese  HENT: Negative.   Eyes:       Bilateral cataracts  Respiratory: Positive for shortness of breath (On walking).   Cardiovascular: Positive for palpitations and leg swelling. Negative for chest pain.  Gastrointestinal:       Burping and reflux  Endocrine:       Diabetic  Genitourinary: Positive for frequency.       Urge incontinence  Musculoskeletal: Positive for back pain.       Knee pains. Shoulder pains. Some back pains.  Skin: Positive for color change.       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 She is aware that she has some mild memory loss.  Hematological: Negative.     Psychiatric/Behavioral: Positive for sleep disturbance. The patient is nervous/anxious.        Prior hx of depressive symptoms.     Filed Vitals:   07/15/15 1618  BP: 118/76  Pulse: 95  Temp: 97.6 F (36.4 C)  TempSrc: Oral  Resp: 14  Height: $Remove'5\' 3"'mwdbWAc$  (1.6 m)  Weight: 173 lb (78.472 kg)  SpO2: 98%   Body mass index is 30.65 kg/(m^2).  Physical Exam  Constitutional: She is oriented to person, place, and time.  Morbidly obese  HENT:  Head: Normocephalic and atraumatic.  Right Ear: External ear normal.  Left Ear: External ear normal.  Nose: Nose normal.  Eyes:  Bilateral cataracts  Neck: Normal range of motion. Neck supple. No JVD present. No tracheal deviation present. No thyromegaly present.  Cardiovascular: Normal rate, regular rhythm and normal heart sounds.  Exam reveals no gallop and no friction rub.   No murmur heard. Pulses:      Dorsalis pedis pulses are 1+ on the right side, and 0 on the left side.       Posterior tibial pulses are 2+ on the right side, and 2+ on the left side.  Pacemaker Diminished DP AND PT bilaterally  Pulmonary/Chest: Effort normal. No respiratory distress. She has no wheezes. She has rales. She exhibits no tenderness.  Pacemaker left upper chest wall. Dyspnea on exertion  Abdominal: Soft. Bowel sounds are normal. She exhibits no distension and no mass.  Musculoskeletal: She exhibits edema.  Pain in the left shoulder with abduction and with rotation  Lymphadenopathy:    She has no cervical adenopathy.  Neurological: She is alert and oriented to person, place, and time. She has normal reflexes. No cranial nerve deficit. Coordination normal.  Decreased sensation to bilateral feet. 05/06/15 6CIT score 3/28  Skin: No rash noted. No erythema. No pallor.  Eczematous lesions on arms  Psychiatric: She has a normal mood and affect. Her behavior is normal. Thought content normal.    Labs reviewed: Lab Summary Latest Ref Rng 04/28/2015 12/16/2014  08/12/2014  Hemoglobin 13.0-17.0 g/dL (None) (None) (None)  Hematocrit 39.0-52.0 % (None) (None) (None)  White count - (None) (None) (None)  Platelet count - (None) (None) (None)  Sodium 134 - 144 mmol/L 140 136 138  Potassium 3.5 - 5.2 mmol/L 5.0 4.8 5.7(H)  Calcium 8.7 - 10.3 mg/dL 9.3 9.5 10.0  Phosphorus - (None) (None) (None)  Creatinine 0.57 - 1.00 mg/dL 1.05(H) 0.99 1.31(H)  AST - (  None) (None) (None)  Alk Phos - (None) (None) (None)  Bilirubin - (None) (None) (None)  Glucose 65 - 99 mg/dL 101(H) 125(H) 141(H)  Cholesterol - (None) (None) (None)  HDL cholesterol >39 mg/dL 55 58 (None)  Triglycerides 0 - 149 mg/dL 61 77 (None)  LDL Direct - (None) (None) (None)  LDL Calc 0 - 99 mg/dL 48 43 (None)  Total protein - (None) (None) (None)  Albumin - (None) (None) (None)   Lab Results  Component Value Date   TSH 1.994 01/09/2012   Lab Results  Component Value Date   BUN 18 04/28/2015   Lab Results  Component Value Date   HGBA1C 7.3* 04/28/2015   Dg Chest 2 View  07/11/2015   CLINICAL DATA:  COPD. Bronchitis. Cough. Wheezing. Shortness of breath.  EXAM: CHEST  2 VIEW  COMPARISON:  01/11/2012 chest radiograph .  FINDINGS: Stable configuration of dual lead left subclavian pacemaker with lead tips overlying the right atrium and right ventricle. Moderate enlargement of the cardiac silhouette, which appears increased in the interval. Otherwise stable mediastinal contour with mild tortuosity of the thoracic aorta. No pneumothorax. No pleural effusion. No focal lung consolidation. There is cephalization of the pulmonary vasculature without overt pulmonary edema. Moderate degenerative changes in the visualized thoracolumbar spine.  IMPRESSION: 1. Worsening moderate enlargement of the cardiac silhouette, cannot exclude pericardial effusion. 2. Cephalization of the pulmonary vasculature without overt pulmonary edema.   Electronically Signed   By: Ilona Sorrel M.D.   On: 07/11/2015 16:53    Assessment/Plan  1. Cardiomegaly - Ambulatory referral to Cardiology - Echocardiogram; Future  2. SOB (shortness of breath) Has not improved  3. Pericardial effusion - Ambulatory referral to Cardiology - Echocardiogram; Future - furosemide (LASIX) 40 MG tablet; One daily to help control fluid retention  Dispense: 30 tablet; Refill: 3 - spironolactone (ALDACTONE) 25 MG tablet; One daily to reduce fluid retention and to strenghthen the heart  Dispense: 30 tablet; Refill: 3

## 2015-07-16 ENCOUNTER — Telehealth: Payer: Self-pay | Admitting: Internal Medicine

## 2015-07-16 NOTE — Telephone Encounter (Signed)
Danielle Benitez called and spoke with Dr Thomasene Lot office.  They do not know about an echo but only that she needed to be seen.  Danielle Benitez has contacted the daughter and left her a message to return her call.  We can have her seen by a PA tomorrow

## 2015-07-16 NOTE — Telephone Encounter (Signed)
New message  Pt c/o Shortness Of Breath: STAT if SOB developed within the last 24 hours or pt is noticeably SOB on the phone  1. Are you currently SOB (can you hear that pt is SOB on the phone)? No ( Daughter called states that she was when she left the house this morning.  2. How long have you been experiencing SOB? 4-6 weeks  3. Are you SOB when sitting or when up moving around? Moving around mostly and has gotten worse. Per daughter she had an episode last night and had to sit in the recliner for 3 hours  4.  Are you currently experiencing any other symptoms? Cough   Comments: Pt daughter called states that for 4-6 weeks pt has been sick with SOB. They though it was from allergies, went for chest xray found that the pt had an enlarged heart and pericardial perfusion will need an ECHO Per PCP Dr. Eulah Citizen with [facility]. Please call daughter back to discuss

## 2015-07-17 ENCOUNTER — Encounter: Payer: Self-pay | Admitting: Physician Assistant

## 2015-07-17 ENCOUNTER — Ambulatory Visit (HOSPITAL_BASED_OUTPATIENT_CLINIC_OR_DEPARTMENT_OTHER): Payer: Medicare Other

## 2015-07-17 ENCOUNTER — Other Ambulatory Visit: Payer: Self-pay

## 2015-07-17 ENCOUNTER — Encounter: Payer: Self-pay | Admitting: Internal Medicine

## 2015-07-17 ENCOUNTER — Ambulatory Visit (INDEPENDENT_AMBULATORY_CARE_PROVIDER_SITE_OTHER): Payer: Medicare Other | Admitting: *Deleted

## 2015-07-17 ENCOUNTER — Ambulatory Visit (INDEPENDENT_AMBULATORY_CARE_PROVIDER_SITE_OTHER): Payer: Medicare Other | Admitting: Physician Assistant

## 2015-07-17 VITALS — BP 102/62 | HR 111 | Ht 63.0 in | Wt 168.8 lb

## 2015-07-17 DIAGNOSIS — E875 Hyperkalemia: Secondary | ICD-10-CM | POA: Diagnosis not present

## 2015-07-17 DIAGNOSIS — I313 Pericardial effusion (noninflammatory): Secondary | ICD-10-CM

## 2015-07-17 DIAGNOSIS — I10 Essential (primary) hypertension: Secondary | ICD-10-CM

## 2015-07-17 DIAGNOSIS — E669 Obesity, unspecified: Secondary | ICD-10-CM

## 2015-07-17 DIAGNOSIS — N183 Chronic kidney disease, stage 3 unspecified: Secondary | ICD-10-CM

## 2015-07-17 DIAGNOSIS — I3139 Other pericardial effusion (noninflammatory): Secondary | ICD-10-CM

## 2015-07-17 DIAGNOSIS — I517 Cardiomegaly: Secondary | ICD-10-CM

## 2015-07-17 DIAGNOSIS — E1121 Type 2 diabetes mellitus with diabetic nephropathy: Secondary | ICD-10-CM | POA: Diagnosis not present

## 2015-07-17 DIAGNOSIS — I4819 Other persistent atrial fibrillation: Secondary | ICD-10-CM

## 2015-07-17 DIAGNOSIS — I340 Nonrheumatic mitral (valve) insufficiency: Secondary | ICD-10-CM

## 2015-07-17 DIAGNOSIS — I319 Disease of pericardium, unspecified: Secondary | ICD-10-CM

## 2015-07-17 DIAGNOSIS — D509 Iron deficiency anemia, unspecified: Secondary | ICD-10-CM | POA: Diagnosis not present

## 2015-07-17 DIAGNOSIS — I5023 Acute on chronic systolic (congestive) heart failure: Secondary | ICD-10-CM | POA: Diagnosis not present

## 2015-07-17 DIAGNOSIS — Z950 Presence of cardiac pacemaker: Secondary | ICD-10-CM | POA: Insufficient documentation

## 2015-07-17 DIAGNOSIS — I480 Paroxysmal atrial fibrillation: Secondary | ICD-10-CM | POA: Diagnosis not present

## 2015-07-17 DIAGNOSIS — R001 Bradycardia, unspecified: Secondary | ICD-10-CM

## 2015-07-17 DIAGNOSIS — N184 Chronic kidney disease, stage 4 (severe): Secondary | ICD-10-CM | POA: Insufficient documentation

## 2015-07-17 DIAGNOSIS — I130 Hypertensive heart and chronic kidney disease with heart failure and stage 1 through stage 4 chronic kidney disease, or unspecified chronic kidney disease: Secondary | ICD-10-CM | POA: Diagnosis not present

## 2015-07-17 DIAGNOSIS — I350 Nonrheumatic aortic (valve) stenosis: Secondary | ICD-10-CM

## 2015-07-17 DIAGNOSIS — E119 Type 2 diabetes mellitus without complications: Secondary | ICD-10-CM | POA: Insufficient documentation

## 2015-07-17 DIAGNOSIS — E860 Dehydration: Secondary | ICD-10-CM | POA: Diagnosis not present

## 2015-07-17 DIAGNOSIS — R0602 Shortness of breath: Secondary | ICD-10-CM

## 2015-07-17 DIAGNOSIS — Z887 Allergy status to serum and vaccine status: Secondary | ICD-10-CM | POA: Diagnosis not present

## 2015-07-17 DIAGNOSIS — E785 Hyperlipidemia, unspecified: Secondary | ICD-10-CM | POA: Diagnosis not present

## 2015-07-17 DIAGNOSIS — I5021 Acute systolic (congestive) heart failure: Secondary | ICD-10-CM

## 2015-07-17 DIAGNOSIS — I959 Hypotension, unspecified: Secondary | ICD-10-CM | POA: Diagnosis not present

## 2015-07-17 DIAGNOSIS — I951 Orthostatic hypotension: Secondary | ICD-10-CM | POA: Diagnosis not present

## 2015-07-17 DIAGNOSIS — N170 Acute kidney failure with tubular necrosis: Secondary | ICD-10-CM | POA: Diagnosis not present

## 2015-07-17 DIAGNOSIS — D649 Anemia, unspecified: Secondary | ICD-10-CM | POA: Diagnosis not present

## 2015-07-17 DIAGNOSIS — Z7901 Long term (current) use of anticoagulants: Secondary | ICD-10-CM | POA: Diagnosis not present

## 2015-07-17 DIAGNOSIS — I4891 Unspecified atrial fibrillation: Secondary | ICD-10-CM

## 2015-07-17 DIAGNOSIS — E1122 Type 2 diabetes mellitus with diabetic chronic kidney disease: Secondary | ICD-10-CM | POA: Diagnosis not present

## 2015-07-17 DIAGNOSIS — Z6830 Body mass index (BMI) 30.0-30.9, adult: Secondary | ICD-10-CM | POA: Insufficient documentation

## 2015-07-17 DIAGNOSIS — Z888 Allergy status to other drugs, medicaments and biological substances status: Secondary | ICD-10-CM | POA: Diagnosis not present

## 2015-07-17 DIAGNOSIS — I071 Rheumatic tricuspid insufficiency: Secondary | ICD-10-CM | POA: Insufficient documentation

## 2015-07-17 DIAGNOSIS — Z7984 Long term (current) use of oral hypoglycemic drugs: Secondary | ICD-10-CM | POA: Diagnosis not present

## 2015-07-17 DIAGNOSIS — I38 Endocarditis, valve unspecified: Secondary | ICD-10-CM | POA: Diagnosis not present

## 2015-07-17 DIAGNOSIS — I2510 Atherosclerotic heart disease of native coronary artery without angina pectoris: Secondary | ICD-10-CM | POA: Diagnosis not present

## 2015-07-17 DIAGNOSIS — I4510 Unspecified right bundle-branch block: Secondary | ICD-10-CM | POA: Diagnosis not present

## 2015-07-17 LAB — CUP PACEART INCLINIC DEVICE CHECK
Battery Voltage: 2.97 V
Brady Statistic RA Percent Paced: 94.38 %
Brady Statistic RV Percent Paced: 10.31 %
Lead Channel Pacing Threshold Pulse Width: 0.4 ms
Lead Channel Sensing Intrinsic Amplitude: 1.6653
Lead Channel Setting Pacing Amplitude: 2.5 V
Lead Channel Setting Pacing Pulse Width: 0.4 ms
MDC IDC MSMT LEADCHNL RA IMPEDANCE VALUE: 332 Ohm
MDC IDC MSMT LEADCHNL RV IMPEDANCE VALUE: 464 Ohm
MDC IDC MSMT LEADCHNL RV PACING THRESHOLD AMPLITUDE: 1 V
MDC IDC MSMT LEADCHNL RV SENSING INTR AMPL: 2.4282
MDC IDC SESS DTM: 20161006160225
MDC IDC SET LEADCHNL RA PACING AMPLITUDE: 2 V
MDC IDC SET LEADCHNL RV SENSING SENSITIVITY: 0.9 mV
MDC IDC STAT BRADY AP VP PERCENT: 10.07 %
MDC IDC STAT BRADY AP VS PERCENT: 84.31 %
MDC IDC STAT BRADY AS VP PERCENT: 0.23 %
MDC IDC STAT BRADY AS VS PERCENT: 5.38 %
Zone Setting Detection Interval: 350 ms
Zone Setting Detection Interval: 400 ms

## 2015-07-17 LAB — CBC WITH DIFFERENTIAL/PLATELET
BASOS ABS: 0.1 10*3/uL (ref 0.0–0.1)
BASOS PCT: 1 % (ref 0–1)
Eosinophils Absolute: 0.2 10*3/uL (ref 0.0–0.7)
Eosinophils Relative: 3 % (ref 0–5)
HEMATOCRIT: 29.1 % — AB (ref 36.0–46.0)
HEMOGLOBIN: 8.7 g/dL — AB (ref 12.0–15.0)
LYMPHS PCT: 33 % (ref 12–46)
Lymphs Abs: 1.8 10*3/uL (ref 0.7–4.0)
MCH: 23.3 pg — ABNORMAL LOW (ref 26.0–34.0)
MCHC: 29.9 g/dL — ABNORMAL LOW (ref 30.0–36.0)
MCV: 77.8 fL — ABNORMAL LOW (ref 78.0–100.0)
MPV: 10.8 fL (ref 8.6–12.4)
Monocytes Absolute: 0.5 10*3/uL (ref 0.1–1.0)
Monocytes Relative: 10 % (ref 3–12)
NEUTROS ABS: 2.9 10*3/uL (ref 1.7–7.7)
NEUTROS PCT: 53 % (ref 43–77)
Platelets: 191 10*3/uL (ref 150–400)
RBC: 3.74 MIL/uL — AB (ref 3.87–5.11)
RDW: 18 % — ABNORMAL HIGH (ref 11.5–15.5)
WBC: 5.4 10*3/uL (ref 4.0–10.5)

## 2015-07-17 MED ORDER — METOPROLOL TARTRATE 25 MG PO TABS: 25.0000 mg | ORAL_TABLET | Freq: Two times a day (BID) | ORAL | Status: DC

## 2015-07-17 MED ORDER — LISINOPRIL 20 MG PO TABS: ORAL_TABLET | ORAL | Status: DC

## 2015-07-17 MED ORDER — APIXABAN 5 MG PO TABS: 5.0000 mg | ORAL_TABLET | Freq: Two times a day (BID) | ORAL | Status: DC

## 2015-07-17 NOTE — Progress Notes (Signed)
Pacemaker check in clinic w/Dayna Mctier, [place]. Normal device function. Thresholds, sensing, impedances consistent with previous measurements. Device programmed to maximize longevity. (548) AT/AF episodes (4.8%)---max dur. 3 hrs, presently in AF + ASA. (38) high ventricular rates noted---max dur. 1 sec, Max Avg V 171---undersensed AF/RVR. Device programmed at appropriate safety margins. Histogram distribution appropriate for patient activity level. Device programmed to optimize intrinsic conduction. PVAB decreased from to , A sensitivity increased from 0.33mV to 0.37mV due to undersensed AF. Batt voltage 2.97 (ERI 2.81V). Patient will follow up as scheduled.

## 2015-07-17 NOTE — Addendum Note (Signed)
Addended by: Sydnee Cabal R on: 07/17/2015 04:59 PM   Modules accepted: Orders

## 2015-07-17 NOTE — Patient Instructions (Addendum)
Medication Instructions:  Your physician has recommended you make the following change in your medication:  1.  STOP the Aspirin  2.  DECREASE the Lisinopril to 20 mg taking 1/2 tablet daily 3.  START Eliquis 5 mg tablet taking 1 tablet twice a day 4.  START the Lopressor 25 mg tablet taking 1 tablet twice a day    Labwork: TODAY:  CBC W/DIFF                CMET                TSH                BNP                FREE T4                PT/INR  Testing/Procedures: None ordered  Follow-Up: Your physician recommends that you schedule a follow-up appointment in: 1 WEEK WITH THE 1ST AVAILABLE EXTENDER / FLEX IF HAVE TO IS FINE

## 2015-07-17 NOTE — Progress Notes (Signed)
Cardiology Office Note Date:  07/17/2015  Patient ID:  Danielle Benitez, DOB [birth year], MRN [id number] PCP:  Kimber Relic, MD  Cardiologist:  Dr. Jens Som Electrophysiogist:  Dr. Ladona Ridgel  Chief Complaint: shortness of breath  History of Present Illness: Danielle Benitez is a 81 y.o. female with history of HTN, orthostatic hypotension, DM, mild aortic stenosis by echo 08/2013, CKD stage III, symptomatic bradycardia s/p PPM, RBBB, LAFB, dyslipidemia who presents for evaluation of SOB. Prior echo 08/2013: mild LVH, EF 50-55%, mild AS, mild LAE.  She has been followed recently by primary care for dyspnea. CXR 07/11/15: moderate enlargement of cardiac silhouette, cannot exclude pericardial effusion; cephalization of the pulmonary vasculature without overt pulmonary edema. She saw Dr. Neva Seat on 07/15/15 at which time her Lasix was increased to 40mg  daily and she was started on spironolactone 25mg  daily. She was referred here for evaluation and echocardiogram. 2D echo today (per preliminary review by Dr. Tenny Craw) revealed mild AS, EF 25-30%, small pericardial effusion, and she was noted to be in atrial fib with RVR, HR 100-120s. At rest she is around 100-110. HR at PCP's office was documented at 95. She has not had any recent labs.  She says she's been feeling poorly for 5-6 weeks. She initially felt she had a viral syndrome with bronchitis. Since that time she's gradually had worsening SOB, PND and orthopnea. She had to sleep in a recliner last night. She endorses a 12-15lb weight gain over the last 6 months (but before then had lost some weight). Her weight is down 5lb since seeing PCP two days ago. She has had intermittent LEE. She endorses rare fleeting chest pain around her pacemaker site that lasts a second or two. She has not had any other chest pain or exertional angina. She denies any awareness of heart palpitations. She has not had any syncope. There is no known history of bleeding. CHADSVASC = 6 for CHF,  HTN, age x2, diabetes, female. She is not acutely symptomatic in clinic today.   Per pacemaker interrogation review, she has been in atrial fib since 07/11/15 with HR 100-120s.    Past Medical History  Diagnosis Date  . Gout   . Osteoarthritis   . Bursitis   . S/P cardiac pacemaker procedure, PPM [employer] REVO placed 01/11/2012 01/11/2012    a. for symptomatic bradycardia. Followed by Dr. Ladona Ridgel.  . Personal history of fall   . Chronic kidney disease, stage II (mild)   . Memory loss   . Anxiety state, unspecified   . Other and unspecified hyperlipidemia   . Anemia, unspecified   . Abnormality of gait   . Shortness of breath   . Depressive disorder, not elsewhere classified   . Rheumatism, unspecified and fibrositis   . Lumbago   . Nonspecific abnormal results of liver function study   . Obesity, unspecified   . Internal hemorrhoids without mention of complication   . Viremia, unspecified   . Osteoarthrosis, unspecified whether generalized or localized, unspecified site   . Abnormality of gait   . Viremia, unspecified   . Rheumatism, unspecified and fibrositis   . Pain in joint, shoulder region 11/22/2013    leeft   . Pruritus 11/22/2013  . Eczema 11/22/2013  . Full dentures   . HOH (hard of hearing)   . Wears glasses   . Female stress incontinence 05/06/2015  . DM (diabetes mellitus), type 2, uncontrolled, with renal complications (HCC) 01/08/2012  . CKD (chronic kidney disease), stage III   .  Aortic stenosis     a. Mild by echo 08/2013.  . Essential hypertension   . Orthostatic hypotension   . Bifascicular block     Past Surgical History  Procedure Laterality Date  . Extremity cyst excision    . Cardiac catherterization  12/03/2003    Dr Clarene Duke  . Pacemaker placement  2013  . Colonoscopy    . Orif wrist fracture Right 04/09/2014    Procedure: OPEN REDUCTION INTERNAL FIXATION (ORIF) RIGHT DISPLACED  DISTAL RADIUS  FRACTURE;  Surgeon: Dominica Severin, MD;  Location: [facility];  Service: Orthopedics;  Laterality: Right;  . Left heart catheterization with coronary angiogram N/A 01/10/2012    Procedure: LEFT HEART CATHETERIZATION WITH CORONARY ANGIOGRAM;  Surgeon: Chrystie Nose, MD;  Location: [facility] CATH LAB;  Service: Cardiovascular;  Laterality: N/A;  . Permanent pacemaker insertion Left 01/11/2012    Procedure: PERMANENT PACEMAKER INSERTION;  Surgeon: Thurmon Fair, MD;  Location: [facility] CATH LAB;  Service: Cardiovascular;  Laterality: Left;    Current Outpatient Prescriptions  Medication Sig Dispense Refill  . albuterol (PROVENTIL HFA;VENTOLIN HFA) 108 (90 BASE) MCG/ACT inhaler Inhale 2 puffs into the lungs every 6 (six) hours as needed for wheezing or shortness of breath. 1 Inhaler 0  . allopurinol (ZYLOPRIM) 300 MG tablet Take 1 tablet (300 mg total) by mouth daily. 90 tablet 3  . aspirin 81 MG tablet Take 81 mg by mouth daily. Take 1 tablet once daily to prevent  heart attack, stroke and blood clot.    Marland Kitchen atorvastatin (LIPITOR) 40 MG tablet Take 0.5 tablets (20 mg total) by mouth daily at 6 PM. 90 tablet 3  . DULoxetine (CYMBALTA) 20 MG capsule Take 1 tablet by mouth daily to help treat anxiety and nerves. 90 capsule 3  . furosemide (LASIX) 40 MG tablet One daily to help control fluid retention 30 tablet 3  . HYDROcodone-acetaminophen (NORCO/VICODIN) 5-325 MG tablet Take 1 tablet by mouth every 6 (six) hours as needed for moderate pain (joint pain).    Marland Kitchen lisinopril (PRINIVIL,ZESTRIL) 20 MG tablet Take one tablet by mouth once daily for blood pressure 90 tablet 3  . metFORMIN (GLUCOPHAGE-XR) 500 MG 24 hr tablet Take two tablets by mouth each morning to control blood sugar 180 tablet 1  . montelukast (SINGULAIR) 10 MG tablet Take 1 tablet (10 mg total) by mouth at bedtime. 30 tablet 3  . ONETOUCH DELICA LANCETS FINE MISC Check blood sugar daily as directed DX E11.65 100 each 3  . pantoprazole (PROTONIX) 40 MG tablet Take 1 tablet (40 mg total) by mouth  daily. 30 tablet 3  . saxagliptin HCl (ONGLYZA) 5 MG TABS tablet Take one tablet by mouth once daily for blood sugar 30 tablet 5  . spironolactone (ALDACTONE) 25 MG tablet One daily to reduce fluid retention and to strenghthen the heart 30 tablet 3  . triamcinolone (KENALOG) 0.025 % cream Apply 1 application topically 4 (four) times daily as needed (itching).     No current facility-administered medications for this visit.    Allergies:   Beta adrenergic blockers; Jardiance; Fluzone; and Tetanus toxoids   Social History:  The patient  reports that she has never smoked. She does not have any smokeless tobacco history on file. She reports that she does not drink alcohol or use illicit drugs.   Family History:  The patient's family history includes Breast cancer in her mother; Cancer in her brother and mother; Diabetes in her daughter; Hypertension in her  daughter.  ROS:  Please see the history of present illness.    All other systems are reviewed and otherwise negative.   PHYSICAL EXAM: VS:  BP 102/62 mmHg  Pulse 111  Ht  (1.6 m)  Wt 168 lb 12.8 oz (76.567 kg)  BMI 29.91 kg/m2 BMI: Body mass index is 29.91 kg/(m^2). Well nourished, well developed WF in no acute distress. Lively, joking. Speaks in full sentences. HEENT: normocephalic, atraumatic Neck: no JVD, carotid bruits or masses Cardiac:  Irregularly irregular, tachycardic; no murmurs, rubs, or gallops Lungs:  Mild occasional wheezing, otherwise good air movement and no rhonchi or rales Abd: soft, nontender, no hepatomegaly, + BS MS: no deformity or atrophy Ext: trace BLE edema Skin: warm and dry, no rash Neuro:  moves all extremities spontaneously, no focal abnormalities noted, follows commands Psych: euthymic mood, full affect  EKG:  Done today shows atrial fib 111bpm, RBBB, nonspecific T wave changes  Recent Labs: 04/28/2015: BUN 18; Creatinine, Ser 1.05*; Potassium 5.0; Sodium 140  04/28/2015: Chol/HDL Ratio 2.1;  Cholesterol, Total 115; HDL 55; LDL Calculated 48; Triglycerides 61   CrCl cannot be calculated (Patient has no serum creatinine result on file.).   Wt Readings from Last 3 Encounters:  07/17/15 168 lb 12.8 oz (76.567 kg)  07/15/15 173 lb (78.472 kg)  06/19/15 171 lb (77.565 kg)     Other studies reviewed: Additional studies/records reviewed today include: summarized above  ASSESSMENT AND PLAN:  1. Newly recognized atrial fib with RVR - the patient was seen and examined by myself and Dr. Tenny Craw who reviewed the echo in clinic today - the plan was formulated together per our discussion. The patient has been in AF since 07/11/15. Her symptoms of dyspnea and malaise pre-date this by about a months' time. Therefore we cannot attribute all of her symptoms to her tachycardia. It is unclear if this was precipitated by worsening CHF, recent viral syndrome, or perhaps underlying ischemia. Will stop aspirin and start Eliquis  BID for CHADSVASC of 6. Risks/benefits discussed with patient. Will start Lopressor  BID to help with rate control (this was chosen over carvedilol due to softer BP and mild wheezing). Would avoid CCB given her LV dysfunction. Check full panel of labs today. We will bring her back in a week to reassess volume status and rate control. At that time, we will determine next steps including the type of ischemic evaluation to pursue as well as possible TEE/DCCV. Note Dr. Tenny Craw tried to see if she is in the clinic next week but she is not. She says she will still be available by phone to discuss the next steps (I will be out on CME next week.) 2. Acute systolic CHF - EF 25-30% of unclear etiology. She does report a viral syndrome before all of her symptoms started. However, she also has cardiac risk factors. She has not been in atrial fib that long so tachy-mediated cardiomyopathy may be less likely. Will continue current regimen but await labwork to determine if anything needs adjustment.  See rest of plan for med changes.  3. Essential HTN with h/o orthostatic hypotension - BP running softer in clinic today. Will decrease lisinopril to allow for med changes as above. 4. Aortic stenosis - remains mild by echocardiogram. Will follow with periodic echocardiograms. 5. CKD stage III - check labs today given recent initiation of spironolactone & increase in Lasix. 6. Pericardial effusion - will follow clinically. She is not really describing any symptoms of pericarditis.  Disposition: F/u with APP in 1 week.   Current medicines are reviewed at length with the patient today.  The patient did not have any concerns regarding medicines.  Clarise, Chacko PA-C 07/17/2015 3:26 PM     [facility] [address] [place] [address] [phone] (office)  [phone] (fax)

## 2015-07-18 ENCOUNTER — Emergency Department (HOSPITAL_COMMUNITY): Payer: Medicare Other

## 2015-07-18 ENCOUNTER — Inpatient Hospital Stay (HOSPITAL_COMMUNITY)
Admission: EM | Admit: 2015-07-18 | Discharge: 2015-07-28 | DRG: 811 | Disposition: A | Discharge status: Home Health | Payer: Medicare Other | Attending: Cardiology | Admitting: Cardiology

## 2015-07-18 ENCOUNTER — Other Ambulatory Visit: Payer: Self-pay

## 2015-07-18 ENCOUNTER — Encounter (HOSPITAL_COMMUNITY): Payer: Self-pay | Admitting: Emergency Medicine

## 2015-07-18 ENCOUNTER — Other Ambulatory Visit (HOSPITAL_COMMUNITY): Payer: Self-pay

## 2015-07-18 ENCOUNTER — Telehealth: Payer: Self-pay

## 2015-07-18 DIAGNOSIS — Z950 Presence of cardiac pacemaker: Secondary | ICD-10-CM | POA: Diagnosis not present

## 2015-07-18 DIAGNOSIS — Z6832 Body mass index (BMI) 32.0-32.9, adult: Secondary | ICD-10-CM | POA: Diagnosis not present

## 2015-07-18 DIAGNOSIS — I130 Hypertensive heart and chronic kidney disease with heart failure and stage 1 through stage 4 chronic kidney disease, or unspecified chronic kidney disease: Secondary | ICD-10-CM | POA: Diagnosis present

## 2015-07-18 DIAGNOSIS — I5022 Chronic systolic (congestive) heart failure: Secondary | ICD-10-CM

## 2015-07-18 DIAGNOSIS — I4510 Unspecified right bundle-branch block: Secondary | ICD-10-CM | POA: Diagnosis present

## 2015-07-18 DIAGNOSIS — I340 Nonrheumatic mitral (valve) insufficiency: Secondary | ICD-10-CM | POA: Diagnosis present

## 2015-07-18 DIAGNOSIS — E875 Hyperkalemia: Secondary | ICD-10-CM | POA: Diagnosis present

## 2015-07-18 DIAGNOSIS — Z79899 Other long term (current) drug therapy: Secondary | ICD-10-CM

## 2015-07-18 DIAGNOSIS — I951 Orthostatic hypotension: Secondary | ICD-10-CM | POA: Diagnosis present

## 2015-07-18 DIAGNOSIS — D649 Anemia, unspecified: Secondary | ICD-10-CM | POA: Diagnosis not present

## 2015-07-18 DIAGNOSIS — E119 Type 2 diabetes mellitus without complications: Secondary | ICD-10-CM | POA: Diagnosis not present

## 2015-07-18 DIAGNOSIS — M109 Gout, unspecified: Secondary | ICD-10-CM | POA: Diagnosis present

## 2015-07-18 DIAGNOSIS — F411 Generalized anxiety disorder: Secondary | ICD-10-CM | POA: Diagnosis present

## 2015-07-18 DIAGNOSIS — N186 End stage renal disease: Secondary | ICD-10-CM | POA: Diagnosis not present

## 2015-07-18 DIAGNOSIS — N170 Acute kidney failure with tubular necrosis: Secondary | ICD-10-CM | POA: Diagnosis present

## 2015-07-18 DIAGNOSIS — Z992 Dependence on renal dialysis: Secondary | ICD-10-CM | POA: Diagnosis not present

## 2015-07-18 DIAGNOSIS — N179 Acute kidney failure, unspecified: Secondary | ICD-10-CM | POA: Diagnosis not present

## 2015-07-18 DIAGNOSIS — I4891 Unspecified atrial fibrillation: Secondary | ICD-10-CM | POA: Diagnosis present

## 2015-07-18 DIAGNOSIS — R0602 Shortness of breath: Secondary | ICD-10-CM | POA: Diagnosis not present

## 2015-07-18 DIAGNOSIS — I5040 Unspecified combined systolic (congestive) and diastolic (congestive) heart failure: Secondary | ICD-10-CM | POA: Diagnosis not present

## 2015-07-18 DIAGNOSIS — Z7984 Long term (current) use of oral hypoglycemic drugs: Secondary | ICD-10-CM

## 2015-07-18 DIAGNOSIS — E860 Dehydration: Secondary | ICD-10-CM | POA: Diagnosis present

## 2015-07-18 DIAGNOSIS — E669 Obesity, unspecified: Secondary | ICD-10-CM | POA: Diagnosis present

## 2015-07-18 DIAGNOSIS — I2510 Atherosclerotic heart disease of native coronary artery without angina pectoris: Secondary | ICD-10-CM | POA: Diagnosis present

## 2015-07-18 DIAGNOSIS — H9190 Unspecified hearing loss, unspecified ear: Secondary | ICD-10-CM | POA: Diagnosis present

## 2015-07-18 DIAGNOSIS — D509 Iron deficiency anemia, unspecified: Principal | ICD-10-CM

## 2015-07-18 DIAGNOSIS — Z8679 Personal history of other diseases of the circulatory system: Secondary | ICD-10-CM | POA: Diagnosis not present

## 2015-07-18 DIAGNOSIS — E785 Hyperlipidemia, unspecified: Secondary | ICD-10-CM | POA: Diagnosis present

## 2015-07-18 DIAGNOSIS — I480 Paroxysmal atrial fibrillation: Secondary | ICD-10-CM

## 2015-07-18 DIAGNOSIS — Z794 Long term (current) use of insulin: Secondary | ICD-10-CM | POA: Diagnosis not present

## 2015-07-18 DIAGNOSIS — I5023 Acute on chronic systolic (congestive) heart failure: Secondary | ICD-10-CM | POA: Diagnosis not present

## 2015-07-18 DIAGNOSIS — N183 Chronic kidney disease, stage 3 unspecified: Secondary | ICD-10-CM

## 2015-07-18 DIAGNOSIS — I071 Rheumatic tricuspid insufficiency: Secondary | ICD-10-CM | POA: Diagnosis present

## 2015-07-18 DIAGNOSIS — M19012 Primary osteoarthritis, left shoulder: Secondary | ICD-10-CM | POA: Diagnosis present

## 2015-07-18 DIAGNOSIS — I5021 Acute systolic (congestive) heart failure: Secondary | ICD-10-CM

## 2015-07-18 DIAGNOSIS — Z887 Allergy status to serum and vaccine status: Secondary | ICD-10-CM | POA: Diagnosis not present

## 2015-07-18 DIAGNOSIS — I120 Hypertensive chronic kidney disease with stage 5 chronic kidney disease or end stage renal disease: Secondary | ICD-10-CM | POA: Diagnosis not present

## 2015-07-18 DIAGNOSIS — IMO0001 Reserved for inherently not codable concepts without codable children: Secondary | ICD-10-CM

## 2015-07-18 DIAGNOSIS — I959 Hypotension, unspecified: Secondary | ICD-10-CM | POA: Diagnosis present

## 2015-07-18 DIAGNOSIS — I350 Nonrheumatic aortic (valve) stenosis: Secondary | ICD-10-CM

## 2015-07-18 DIAGNOSIS — E1121 Type 2 diabetes mellitus with diabetic nephropathy: Secondary | ICD-10-CM | POA: Diagnosis present

## 2015-07-18 DIAGNOSIS — I481 Persistent atrial fibrillation: Secondary | ICD-10-CM | POA: Diagnosis not present

## 2015-07-18 DIAGNOSIS — I38 Endocarditis, valve unspecified: Secondary | ICD-10-CM | POA: Diagnosis present

## 2015-07-18 DIAGNOSIS — Z7901 Long term (current) use of anticoagulants: Secondary | ICD-10-CM | POA: Diagnosis not present

## 2015-07-18 DIAGNOSIS — I10 Essential (primary) hypertension: Secondary | ICD-10-CM | POA: Diagnosis not present

## 2015-07-18 DIAGNOSIS — I509 Heart failure, unspecified: Secondary | ICD-10-CM

## 2015-07-18 DIAGNOSIS — I3139 Other pericardial effusion (noninflammatory): Secondary | ICD-10-CM

## 2015-07-18 DIAGNOSIS — I313 Pericardial effusion (noninflammatory): Secondary | ICD-10-CM | POA: Diagnosis present

## 2015-07-18 DIAGNOSIS — Z7982 Long term (current) use of aspirin: Secondary | ICD-10-CM | POA: Diagnosis not present

## 2015-07-18 DIAGNOSIS — E1122 Type 2 diabetes mellitus with diabetic chronic kidney disease: Secondary | ICD-10-CM | POA: Diagnosis present

## 2015-07-18 DIAGNOSIS — Z888 Allergy status to other drugs, medicaments and biological substances status: Secondary | ICD-10-CM

## 2015-07-18 DIAGNOSIS — R069 Unspecified abnormalities of breathing: Secondary | ICD-10-CM | POA: Diagnosis not present

## 2015-07-18 DIAGNOSIS — E1129 Type 2 diabetes mellitus with other diabetic kidney complication: Secondary | ICD-10-CM | POA: Diagnosis not present

## 2015-07-18 HISTORY — DX: Atherosclerotic heart disease of native coronary artery without angina pectoris: I25.10

## 2015-07-18 LAB — COMPREHENSIVE METABOLIC PANEL
ALK PHOS: 87 U/L (ref 33–130)
ALT: 7 U/L (ref 6–29)
ALT: 11 U/L — AB (ref 14–54)
AST: 18 U/L (ref 10–35)
AST: 25 U/L (ref 15–41)
Albumin: 4 g/dL (ref 3.6–5.1)
Albumin: 3.4 g/dL — ABNORMAL LOW (ref 3.5–5.0)
Alkaline Phosphatase: 91 U/L (ref 38–126)
Anion gap: 10 (ref 5–15)
BUN: 29 mg/dL — ABNORMAL HIGH (ref 7–25)
BUN: 32 mg/dL — AB (ref 6–20)
CALCIUM: 9.2 mg/dL (ref 8.6–10.4)
CHLORIDE: 97 mmol/L — AB (ref 98–110)
CHLORIDE: 101 mmol/L (ref 101–111)
CO2: 26 mmol/L (ref 20–31)
CO2: 25 mmol/L (ref 22–32)
Calcium: 9.2 mg/dL (ref 8.9–10.3)
Creat: 1.75 mg/dL — ABNORMAL HIGH (ref 0.60–0.88)
Creatinine, Ser: 1.63 mg/dL — ABNORMAL HIGH (ref 0.44–1.00)
GFR, EST AFRICAN AMERICAN: 33 mL/min — AB (ref >60)
GFR, EST NON AFRICAN AMERICAN: 28 mL/min — AB (ref >60)
Glucose, Bld: 112 mg/dL — ABNORMAL HIGH (ref 65–99)
Glucose, Bld: 133 mg/dL — ABNORMAL HIGH (ref 65–99)
POTASSIUM: 5.6 mmol/L — AB (ref 3.5–5.3)
POTASSIUM: 5.1 mmol/L (ref 3.5–5.1)
SODIUM: 136 mmol/L (ref 135–145)
Sodium: 134 mmol/L — ABNORMAL LOW (ref 135–146)
TOTAL PROTEIN: 7.2 g/dL (ref 6.1–8.1)
Total Bilirubin: 0.4 mg/dL (ref 0.2–1.2)
Total Bilirubin: 0.4 mg/dL (ref 0.3–1.2)
Total Protein: 6.6 g/dL (ref 6.5–8.1)

## 2015-07-18 LAB — URINALYSIS, ROUTINE W REFLEX MICROSCOPIC
Bilirubin Urine: NEGATIVE
Glucose, UA: NEGATIVE mg/dL
Hgb urine dipstick: NEGATIVE
Ketones, ur: NEGATIVE mg/dL
Nitrite: NEGATIVE
PROTEIN: 100 mg/dL — AB
SPECIFIC GRAVITY, URINE: 1.012 (ref 1.005–1.030)
UROBILINOGEN UA: 0.2 mg/dL (ref 0.0–1.0)
pH: 5.5 (ref 5.0–8.0)

## 2015-07-18 LAB — BRAIN NATRIURETIC PEPTIDE
B NATRIURETIC PEPTIDE 5: 1357.3 pg/mL — AB (ref 0.0–100.0)
BRAIN NATRIURETIC PEPTIDE: 676.9 pg/mL — AB (ref 0.0–100.0)

## 2015-07-18 LAB — VITAMIN B12: Vitamin B-12: 375 pg/mL (ref 180–914)

## 2015-07-18 LAB — CBC WITH DIFFERENTIAL/PLATELET
BASOS ABS: 0 10*3/uL (ref 0.0–0.1)
Basophils Relative: 0 %
EOS ABS: 0.1 10*3/uL (ref 0.0–0.7)
EOS PCT: 2 %
HCT: 28.2 % — ABNORMAL LOW (ref 36.0–46.0)
Hemoglobin: 8.4 g/dL — ABNORMAL LOW (ref 12.0–15.0)
LYMPHS PCT: 15 %
Lymphs Abs: 1 10*3/uL (ref 0.7–4.0)
MCH: 23.5 pg — ABNORMAL LOW (ref 26.0–34.0)
MCHC: 29.8 g/dL — ABNORMAL LOW (ref 30.0–36.0)
MCV: 78.8 fL (ref 78.0–100.0)
MONO ABS: 0.4 10*3/uL (ref 0.1–1.0)
Monocytes Relative: 7 %
Neutro Abs: 5.1 10*3/uL (ref 1.7–7.7)
Neutrophils Relative %: 76 %
PLATELETS: 178 10*3/uL (ref 150–400)
RBC: 3.58 MIL/uL — AB (ref 3.87–5.11)
RDW: 17.8 % — AB (ref 11.5–15.5)
WBC: 6.7 10*3/uL (ref 4.0–10.5)

## 2015-07-18 LAB — PROTIME-INR
INR: 1.15 (ref <1.50)
PROTHROMBIN TIME: 14.8 s (ref 11.6–15.2)

## 2015-07-18 LAB — GLUCOSE, CAPILLARY
Glucose-Capillary: 132 mg/dL — ABNORMAL HIGH (ref 65–99)
Glucose-Capillary: 149 mg/dL — ABNORMAL HIGH (ref 65–99)

## 2015-07-18 LAB — IRON AND TIBC
Iron: 13 ug/dL — ABNORMAL LOW (ref 28–170)
Saturation Ratios: 3 % — ABNORMAL LOW (ref 10.4–31.8)
TIBC: 423 ug/dL (ref 250–450)
UIBC: 410 ug/dL

## 2015-07-18 LAB — RETICULOCYTES
RBC.: 3.5 MIL/uL — AB (ref 3.87–5.11)
RETIC COUNT ABSOLUTE: 38.5 10*3/uL (ref 19.0–186.0)
RETIC CT PCT: 1.1 % (ref 0.4–3.1)

## 2015-07-18 LAB — I-STAT TROPONIN, ED: Troponin i, poc: 0.01 ng/mL (ref 0.00–0.08)

## 2015-07-18 LAB — FOLATE: FOLATE: 15.3 ng/mL (ref >5.9)

## 2015-07-18 LAB — TROPONIN I
Troponin I: 0.03 ng/mL (ref <0.031)
Troponin I: 0.04 ng/mL — ABNORMAL HIGH (ref <0.031)

## 2015-07-18 LAB — URINE MICROSCOPIC-ADD ON

## 2015-07-18 LAB — FERRITIN: FERRITIN: 5 ng/mL — AB (ref 11–307)

## 2015-07-18 MED ORDER — ZOLPIDEM TARTRATE 5 MG PO TABS
5.0000 mg | ORAL_TABLET | Freq: Once | ORAL | Status: AC
  Administered 2015-07-18: 5 mg via ORAL
  Filled 2015-07-18: qty 1

## 2015-07-18 MED ORDER — ALBUTEROL SULFATE HFA 108 (90 BASE) MCG/ACT IN AERS: 2.0000 | INHALATION_SPRAY | Freq: Four times a day (QID) | RESPIRATORY_TRACT | Status: DC | PRN

## 2015-07-18 MED ORDER — SODIUM CHLORIDE 0.9 % IV SOLN
INTRAVENOUS | Status: DC
  Administered 2015-07-18: 22:00:00 via INTRAVENOUS

## 2015-07-18 MED ORDER — FUROSEMIDE 20 MG PO TABS
20.0000 mg | ORAL_TABLET | Freq: Every day | ORAL | Status: DC
  Administered 2015-07-19 – 2015-07-20 (×2): 20 mg via ORAL
  Filled 2015-07-18 (×2): qty 1

## 2015-07-18 MED ORDER — METOPROLOL TARTRATE 25 MG PO TABS
25.0000 mg | ORAL_TABLET | Freq: Two times a day (BID) | ORAL | Status: DC
  Administered 2015-07-18: 25 mg via ORAL
  Filled 2015-07-18 (×2): qty 1

## 2015-07-18 MED ORDER — ALBUTEROL SULFATE (2.5 MG/3ML) 0.083% IN NEBU
2.5000 mg | INHALATION_SOLUTION | Freq: Four times a day (QID) | RESPIRATORY_TRACT | Status: DC | PRN
Start: 2015-07-18 — End: 2015-07-28

## 2015-07-18 MED ORDER — SODIUM CHLORIDE 0.9 % IV SOLN: 250.0000 mL | INTRAVENOUS | Status: DC | PRN

## 2015-07-18 MED ORDER — SODIUM CHLORIDE 0.9 % IJ SOLN
3.0000 mL | Freq: Two times a day (BID) | INTRAMUSCULAR | Status: DC
  Administered 2015-07-18 – 2015-07-27 (×11): 3 mL via INTRAVENOUS

## 2015-07-18 MED ORDER — ONDANSETRON HCL 4 MG/2ML IJ SOLN
4.0000 mg | Freq: Once | INTRAMUSCULAR | Status: AC
  Administered 2015-07-18: 4 mg via INTRAVENOUS
  Filled 2015-07-18: qty 2

## 2015-07-18 MED ORDER — ALLOPURINOL 300 MG PO TABS
300.0000 mg | ORAL_TABLET | Freq: Every day | ORAL | Status: DC
  Administered 2015-07-19: 300 mg via ORAL
  Filled 2015-07-18: qty 1

## 2015-07-18 MED ORDER — INSULIN ASPART 100 UNIT/ML ~~LOC~~ SOLN
0.0000 [IU] | Freq: Three times a day (TID) | SUBCUTANEOUS | Status: DC
Start: 2015-07-18 — End: 2015-07-28
  Administered 2015-07-18: 1 [IU] via SUBCUTANEOUS
  Administered 2015-07-19: 2 [IU] via SUBCUTANEOUS
  Administered 2015-07-19: 1 [IU] via SUBCUTANEOUS
  Administered 2015-07-20 (×2): 2 [IU] via SUBCUTANEOUS
  Administered 2015-07-20: 1 [IU] via SUBCUTANEOUS
  Administered 2015-07-21: 3 [IU] via SUBCUTANEOUS
  Administered 2015-07-21 – 2015-07-22 (×3): 2 [IU] via SUBCUTANEOUS
  Administered 2015-07-22: 3 [IU] via SUBCUTANEOUS
  Administered 2015-07-22: 1 [IU] via SUBCUTANEOUS
  Administered 2015-07-23: 2 [IU] via SUBCUTANEOUS
  Administered 2015-07-23: 3 [IU] via SUBCUTANEOUS
  Administered 2015-07-23: 2 [IU] via SUBCUTANEOUS
  Administered 2015-07-24: 3 [IU] via SUBCUTANEOUS
  Administered 2015-07-24 (×2): 2 [IU] via SUBCUTANEOUS
  Administered 2015-07-25: 5 [IU] via SUBCUTANEOUS
  Administered 2015-07-26: 2 [IU] via SUBCUTANEOUS
  Administered 2015-07-26: 3 [IU] via SUBCUTANEOUS
  Administered 2015-07-26: 2 [IU] via SUBCUTANEOUS
  Administered 2015-07-27: 5 [IU] via SUBCUTANEOUS
  Administered 2015-07-27: 1 [IU] via SUBCUTANEOUS
  Administered 2015-07-27 – 2015-07-28 (×2): 3 [IU] via SUBCUTANEOUS
  Administered 2015-07-28: 2 [IU] via SUBCUTANEOUS

## 2015-07-18 MED ORDER — SODIUM CHLORIDE 0.9 % IJ SOLN
3.0000 mL | INTRAMUSCULAR | Status: DC | PRN
Start: 2015-07-18 — End: 2015-07-28

## 2015-07-18 MED ORDER — ATORVASTATIN CALCIUM 20 MG PO TABS
20.0000 mg | ORAL_TABLET | Freq: Every day | ORAL | Status: DC
  Administered 2015-07-18 – 2015-07-27 (×10): 20 mg via ORAL
  Filled 2015-07-18 (×10): qty 1

## 2015-07-18 MED ORDER — LINAGLIPTIN 5 MG PO TABS: 5.0000 mg | ORAL_TABLET | Freq: Every day | ORAL | Status: DC

## 2015-07-18 MED ORDER — PANTOPRAZOLE SODIUM 40 MG PO TBEC
40.0000 mg | DELAYED_RELEASE_TABLET | Freq: Two times a day (BID) | ORAL | Status: DC
  Administered 2015-07-18 – 2015-07-28 (×20): 40 mg via ORAL
  Filled 2015-07-18 (×20): qty 1

## 2015-07-18 MED ORDER — DULOXETINE HCL 20 MG PO CPEP
20.0000 mg | ORAL_CAPSULE | Freq: Every day | ORAL | Status: DC
  Administered 2015-07-19 – 2015-07-28 (×9): 20 mg via ORAL
  Filled 2015-07-18 (×10): qty 1

## 2015-07-18 MED ORDER — ACETAMINOPHEN 325 MG PO TABS
650.0000 mg | ORAL_TABLET | ORAL | Status: DC | PRN
  Administered 2015-07-19: 650 mg via ORAL
  Filled 2015-07-18: qty 2

## 2015-07-18 MED ORDER — ONDANSETRON HCL 4 MG/2ML IJ SOLN
4.0000 mg | Freq: Four times a day (QID) | INTRAMUSCULAR | Status: DC | PRN
  Administered 2015-07-23: 4 mg via INTRAVENOUS
  Filled 2015-07-18: qty 2

## 2015-07-18 MED ORDER — MONTELUKAST SODIUM 10 MG PO TABS
10.0000 mg | ORAL_TABLET | Freq: Every day | ORAL | Status: DC
  Administered 2015-07-18 – 2015-07-27 (×10): 10 mg via ORAL
  Filled 2015-07-18 (×10): qty 1

## 2015-07-18 NOTE — ED Notes (Signed)
Pt was transferring from bedside commode to stretcher. Pt bent her knee to get into the bed and stated that her knee gave out. Pt was not a high fall risk at the time, pt had on brown grip socks. Pt slid herself to the floor, falling on her buttocks between the stretcher and counter. Pt did not fall onto back, hit her head nor her neck. Pt is now a fall risk and has on a yellow bracelet and yellow grip socks. Erin, RN and this NT assisted pt back up and onto the stretcher. Pt states nothing hurts, however Dr. Adela Lank made aware of fall as well as Consulting civil engineer.

## 2015-07-18 NOTE — ED Notes (Signed)
Adela Lank MD made aware of pt blood pressure

## 2015-07-18 NOTE — Telephone Encounter (Signed)
Prior auth for Eliquis 5 mg sent to [facility]. 

## 2015-07-18 NOTE — H&P (Signed)
Cardiology H&P  Patient ID: Danielle Benitez, MRN: [id number], DOB/AGE: 12-Apr-1932 81 y.o. Admit date: 07/18/2015   Date of Consult: 07/18/2015 Primary Physician: Kimber Relic, MD Primary Cardiologist: Dr. Jens Som, EP: Dr. Ladona Ridgel  Chief Complaint: SOB, nausea, fatigue Reason for Consultation: atrial fib, low EF  HPI: Danielle Benitez is a 81 y.o. female with history of HTN, orthostatic hypotension, DM, mild aortic stenosis, CKD stage III, symptomatic bradycardia s/p Medtronic PPM, RBBB/LAFB, dyslipidemia, nonobstructive CAD in 2013 (20-30% prox RCA), and diagnosis of LV dysfunction/afib yesterday who presented to the ER for worsening hemoglobin level.   She has recently been followed by primary care for dyspnea. She's been feeling poorly for 5-6 weeks. She initially thought she had a viral syndrome with bronchitis. Since that time she's gradually had worsening SOB, PND and orthopnea. She's had to sleep in a recliner. She endorses a 12-15lb weight gain over the last 6 months (but before then, says she had lost some weight). Outpatient CXR 07/11/15: moderate enlargement of cardiac silhouette, cannot exclude pericardial effusion; cephalization of the pulmonary vasculature without overt pulmonary edema. She saw Dr. Neva Seat on 07/15/15 at which time her Lasix was increased to  daily and she was started on spironolactone  daily. She was referred for cardiology evaluation and echocardiogram.  She had not had any recent labs. Her weight was down 5lbs since increase in diuretics. Yesterday we found her to have new diagnoses of systolic CHF and atrial fib with RVR. 2D echo yesterday (formal read): EF 35-40%, mod dilated LV, severely calcified AV leaflets with mild AS, mod MR, mod TR, severely dilated LA, small pericardial effusion (findings not c/w tamponade), PASP 48. She was noted to be in atrial fib with RVR, HR 100-120s. Pacemaker interrogation revealed that she'd been in atrial fib since 07/11/15 with HR 100-120.  She denied any awareness of palpitations and was not acutely symptomatic yesterday. We added Toprol, added Eliquis (CHADSVASC 6), stopped aspirin, and cut lisinopril in half. Comprehensive labwork was obtained. We planned close f/u in 1 week to determine plan for TEE/DCCV +/- ischemic eval once rate control and volume status had improved.  Her CBC resulted early this morning, revealing a Hgb of 8.7, down from previous value in 04/09/14 of 13.9. She was referred to the ER for further evaluation. While trying to get ready this AM to come, she developed worsening SOB, diaphoresis, nausea and chest pressure. (Yesterday she had reported that she only has occasional fleeting chest discomfort around pacemaker site.) 911 was called. In the ER, HR is 80s-100, BP hypotensive in the 80s-90s systolic. Labs in the ER reveal AKI with BUN/Cr 32/1.63, glucose 133, albumin 3.4, BNP 1357, Hgb now 8.4. Troponin neg x 1. As yesterday, the patient denies any features consistent with significant bleeding - no BRBPR, melena, hematemesis, hematuria. She did have issues with post-cath bruising in 2013 but that has long since resolved. CXR shows stable cardiomegaly with mild pulmonary vascular congestion. She was given 500 cc NS bolus and Zofran. She now feels comfortable at rest. No LEE present.   Past Medical History  Diagnosis Date  . Gout   . Osteoarthritis   . Bursitis   . S/P cardiac pacemaker procedure, PPM Medtronic REVO placed 01/11/2012 01/11/2012    a. for symptomatic bradycardia. Followed by Dr. Ladona Ridgel.  . Personal history of fall   . Chronic kidney disease, stage II (mild)   . Memory loss   . Anxiety state, unspecified   . Other and unspecified  hyperlipidemia   . Anemia, unspecified   . Abnormality of gait   . Shortness of breath   . Depressive disorder, not elsewhere classified   . Rheumatism, unspecified and fibrositis   . Lumbago   . Nonspecific abnormal results of liver function study   . Obesity,  unspecified   . Internal hemorrhoids without mention of complication   . Viremia, unspecified   . Osteoarthrosis, unspecified whether generalized or localized, unspecified site   . Abnormality of gait   . Viremia, unspecified   . Rheumatism, unspecified and fibrositis   . Pain in joint, shoulder region 11/22/2013    leeft   . Pruritus 11/22/2013  . Eczema 11/22/2013  . Full dentures   . HOH (hard of hearing)   . Wears glasses   . Female stress incontinence 05/06/2015  . DM (diabetes mellitus), type 2, uncontrolled, with renal complications (HCC) 01/08/2012  . CKD (chronic kidney disease), stage III   . Aortic stenosis     a. Mild by echo 08/2013.  . Essential hypertension   . Orthostatic hypotension   . Bifascicular block   . Atrial fibrillation (HCC)   . Chronic systolic CHF (congestive heart failure) (HCC)     a. Dx 07/2015 - EF 35-40%, unclear etiology      Surgical History:  Past Surgical History  Procedure Laterality Date  . Extremity cyst excision    . Cardiac catherterization  12/03/2003    Dr Clarene Duke  . Pacemaker placement  2013  . Colonoscopy    . Orif wrist fracture Right 04/09/2014    Procedure: OPEN REDUCTION INTERNAL FIXATION (ORIF) RIGHT DISPLACED  DISTAL RADIUS  FRACTURE;  Surgeon: Dominica Severin, MD;  Location: [facility];  Service: Orthopedics;  Laterality: Right;  . Left heart catheterization with coronary angiogram N/A 01/10/2012    Procedure: LEFT HEART CATHETERIZATION WITH CORONARY ANGIOGRAM;  Surgeon: Chrystie Nose, MD;  Location: [facility] CATH LAB;  Service: Cardiovascular;  Laterality: N/A;  . Permanent pacemaker insertion Left 01/11/2012    Procedure: PERMANENT PACEMAKER INSERTION;  Surgeon: Thurmon Fair, MD;  Location: [facility] CATH LAB;  Service: Cardiovascular;  Laterality: Left;     Home Meds: Prior to Admission medications   Medication Sig Start Date End Date Taking? Authorizing Provider  albuterol (PROVENTIL HFA;VENTOLIN HFA) 108 (90 BASE)  MCG/ACT inhaler Inhale 2 puffs into the lungs every 6 (six) hours as needed for wheezing or shortness of breath. 06/19/15  Yes Sharon Seller, NP  allopurinol (ZYLOPRIM) 300 MG tablet Take 1 tablet (300 mg total) by mouth daily. 06/20/15  Yes Kimber Relic, MD  apixaban (ELIQUIS) 5 MG TABS tablet Take 1 tablet (5 mg total) by mouth 2 (two) times daily. 07/17/15  Yes Dayna N Neuner, PA-C  aspirin 81 MG chewable tablet Chew 81 mg by mouth daily.   Yes Historical Provider, MD  atorvastatin (LIPITOR) 40 MG tablet Take 0.5 tablets (20 mg total) by mouth daily at 6 PM. 06/20/15 06/25/16 Yes Kimber Relic, MD  DULoxetine (CYMBALTA) 20 MG capsule Take 1 tablet by mouth daily to help treat anxiety and nerves. 06/20/15  Yes Kimber Relic, MD  furosemide (LASIX) 40 MG tablet One daily to help control fluid retention Patient taking differently: 40 mg daily. One daily to help control fluid retention 07/15/15  Yes Kimber Relic, MD  HYDROcodone-acetaminophen (NORCO/VICODIN) 5-325 MG tablet Take 1 tablet by mouth every 6 (six) hours as needed for moderate pain (joint pain).   Yes  Historical Provider, MD  lisinopril (PRINIVIL,ZESTRIL) 20 MG tablet Take 1/2 tablet by mouth once daily for blood pressure 07/17/15  Yes Dayna N Sparkman, PA-C  metFORMIN (GLUCOPHAGE-XR) 500 MG 24 hr tablet Take two tablets by mouth each morning to control blood sugar Patient taking differently: Take 1,000 mg by mouth daily with breakfast. Take two tablets by mouth each morning to control blood sugar 04/09/15  Yes Kimber Relic, MD  metoprolol tartrate (LOPRESSOR) 25 MG tablet Take 1 tablet (25 mg total) by mouth 2 (two) times daily. 07/17/15  Yes Dayna N Ridolfi, PA-C  montelukast (SINGULAIR) 10 MG tablet Take 1 tablet (10 mg total) by mouth at bedtime. 06/19/15  Yes Sharon Seller, NP  [facility] DELICA LANCETS FINE MISC Check blood sugar daily as directed DX E11.65 12/18/14  Yes Kimber Relic, MD  pantoprazole (PROTONIX) 40 MG tablet Take 1 tablet (40 mg  total) by mouth daily. 06/19/15  Yes Sharon Seller, NP  saxagliptin HCl (ONGLYZA) 5 MG TABS tablet Take one tablet by mouth once daily for blood sugar Patient taking differently: Take 5 mg by mouth daily. Take one tablet by mouth once daily for blood sugar 01/23/15  Yes Kimber Relic, MD  spironolactone (ALDACTONE) 25 MG tablet One daily to reduce fluid retention and to strenghthen the heart Patient taking differently: Take 25 mg by mouth daily. One daily to reduce fluid retention and to strenghthen the heart 07/15/15  Yes Kimber Relic, MD  triamcinolone (KENALOG) 0.025 % cream Apply 1 application topically 4 (four) times daily as needed (itching).   Yes Historical Provider, MD    Inpatient Medications:     Allergies:  Allergies  Allergen Reactions  . Beta Adrenergic Blockers     Couldn't speak or hear  . Jardiance [Empagliflozin]     Rash, kidney issues  . Fluzone [Flu Virus Vaccine] Other (See Comments)    Feeling unwell for a week. Cold symptoms/flu symtoms  . Tetanus Toxoids     UNKNOWN    Social History   Social History  . Marital Status: Widowed    Spouse Name: N/A  . Number of Children: N/A  . Years of Education: N/A   Occupational History  . Not on file.   Social History Main Topics  . Smoking status: Never Smoker   . Smokeless tobacco: Not on file  . Alcohol Use: No  . Drug Use: No  . Sexual Activity: Not on file   Other Topics Concern  . Not on file   Social History Narrative   Physically inactive. She says she hurts in her back and knees too much to do much walking.     Family History  Problem Relation Age of Onset  . Breast cancer Mother   . Cancer Mother   . Diabetes Daughter   . Hypertension Daughter   . Cancer Brother      Review of Systems: All other systems reviewed and are otherwise negative except as noted above.  Labs:  Lab Results  Component Value Date   WBC 6.7 07/18/2015   HGB 8.4* 07/18/2015   HCT 28.2* 07/18/2015   MCV  78.8 07/18/2015   PLT 178 07/18/2015    Recent Labs Lab 07/18/15 1023  NA 136  K 5.1  CL 101  CO2 25  BUN 32*  CREATININE 1.63*  CALCIUM 9.2  PROT 6.6  BILITOT 0.4  ALKPHOS 91  ALT 11*  AST 25  GLUCOSE 133*   Lab  Results  Component Value Date   CHOL 115 04/28/2015   HDL 55 04/28/2015   LDLCALC 48 04/28/2015   TRIG 61 04/28/2015    Radiology/Studies:  Dg Chest 2 View  07/18/2015   CLINICAL DATA:  Worsening shortness of breath  EXAM: CHEST  2 VIEW  COMPARISON:  07/11/2015  FINDINGS: There is mild bilateral interstitial thickening. There is no focal parenchymal opacity. There is no pleural effusion or pneumothorax. There is stable cardiomegaly. There is a dual lead cardiac pacemaker.  There is severe osteoarthritis of the left glenohumeral joint.  IMPRESSION: 1. Stable cardiomegaly with mild pulmonary vascular congestion.   Electronically Signed   By: Elige Ko   On: 07/18/2015 11:08   Dg Chest 2 View  07/11/2015   CLINICAL DATA:  COPD. Bronchitis. Cough. Wheezing. Shortness of breath.  EXAM: CHEST  2 VIEW  COMPARISON:  01/11/2012 chest radiograph .  FINDINGS: Stable configuration of dual lead left subclavian pacemaker with lead tips overlying the right atrium and right ventricle. Moderate enlargement of the cardiac silhouette, which appears increased in the interval. Otherwise stable mediastinal contour with mild tortuosity of the thoracic aorta. No pneumothorax. No pleural effusion. No focal lung consolidation. There is cephalization of the pulmonary vasculature without overt pulmonary edema. Moderate degenerative changes in the visualized thoracolumbar spine.  IMPRESSION: 1. Worsening moderate enlargement of the cardiac silhouette, cannot exclude pericardial effusion. 2. Cephalization of the pulmonary vasculature without overt pulmonary edema.   Electronically Signed   By: Delbert Phenix M.D.   On: 07/11/2015 16:53    Wt Readings from Last 3 Encounters:  07/18/15 171 lb (77.565  kg)  07/17/15 168 lb 12.8 oz (76.567 kg)  07/15/15 173 lb (78.472 kg)    EKG: atrial fib with RBBB, LAFB, occasional V pacing - rate 99  Physical Exam: Blood pressure 85/53, pulse 101, temperature 98.1 F (36.7 C), temperature source Oral, resp. rate 17, height 5\' 4"  (1.626 m), weight 171 lb (77.565 kg), SpO2 99 %. General: Well developed, well nourished WF, in no acute distress. Laying 20 degrees in bed without SOB. Head: Normocephalic, atraumatic, sclera non-icteric, no xanthomas, nares are without discharge.  Neck: JVD not elevated. Lungs: Coarse but generally bilaterally to auscultation without wheezes, rales, or rhonchi. Breathing is unlabored. Heart: Irregularly irregular with S1 S2. No murmurs, rubs, or gallops appreciated. Abdomen: Soft, non-tender, non-distended with normoactive bowel sounds. No hepatomegaly. No rebound/guarding. No obvious abdominal masses. Msk:  Strength and tone appear normal for age. Extremities: No clubbing or cyanosis. No edema. + varicose veins present (spider veins). Distal pedal pulses are 2+ and equal bilaterally. Neuro: Alert and oriented X 3. No facial asymmetry. No focal deficit. Moves all extremities spontaneously. Psych:  Responds to questions appropriately with a normal affect.    Assessment and Plan:   1. Symptomatic anemia - this was present before prescription of Eliquis yesterday. This may be the driving force behind recent fatigue, dyspnea and malaise for the last 5-6 weeks. (These symptoms started well before atrial fib started per PPM interrogation.) Check anemia panel. Hemoccult stools. Recommend further workup per GI. We will call unassigned service. Hold further ASA and Eliquis until cleared by GI.  2. Recently diagnosed acute systolic CHF EF 35-40% - will d/c lisinopril and spironolactone given AKI. She already took both of these today. Per Dr. Jens Som, continue Lasix tomorrow but at lower dose of 20mg  daily. Etiology of cardiomyopathy  is not clear. Question tachy-mediated, although she has not been in atrial  fib that long. There is also question of viral disorder last month. She has history of minimal CAD by cath in 2013. After issues with anemia and atrial fib have been sorted out, we may consider updating ischemic workup down the road. Will cycle troponins given brief chest pressure this AM.  3. Recently diagnosed atrial fib (in this since 07/11/15) - suspect this is also making her feel poorly. Rate is now improved on metoprolol. Will continue as long as BP allows. She has reported h/o unusual reaction to BB in the past (could not speak/hear) but has tolerated metoprolol thus far. Will watch closely. Eliquis is on hold due to #1. Avoid CCB with LV dysfunction. We plan to consider TEE/DCCV once issues regarding anemia are sorted out.  4. AKI on CKD stage III - likely pre-renal due to recently increased diuretics in the setting of worsening anemia. Hold diuretics/ACEI and follow Cr.  5. Essential HTN with history of orthostasis, now with hypotension - watch BP with med changes.  6. Valvular heart disease with mod MR, mod TR, mild AS - follow clinically.  7. Small pericardial effusion - follow clinically. She has not had classic signs of pericarditis.  8. DM with nephro complications - hold metformin due to renal insufficiency. Continue onglyza. Add SSI.  SignedRonie Spies PA-C 07/18/2015, 1:08 PM Pager: [phone]  As above; patient seen and examined. Briefly she is an 81 year old female followed by Dr. Ladona Ridgel with past medical history of hypertension, diabetes mellitus, prior pacemaker placement for evaluation of atrial fibrillation and congestive heart failure. The patient describes 6 weeks of progressive dyspnea on exertion, orthopnea and PND. She has mild pedal edema. She denies palpitations or syncope. She occasionally has a chest heaviness unrelated to exertion. She was seen in the office yesterday. Her pacemaker was  interrogated and she was found to have new onset atrial fibrillation dating back to September 30. She was started on apixaban with plans for TEE guided cardioversion. Laboratories obtained revealed a hemoglobin of 8.7. She was asked to come to the emergency room. Laboratory significant for BUN 32 and creatinine 1.63. Note these were normal in July. BNP 1357. Hemoglobin 8.4 with MCV 78.8.  Electrocardiogram shows atrial fibrillation, right bundle branch block, left anterior fascicular block, left ventricular hypertrophy. Echocardiogram shows ejection fraction 35-40%, mild aortic stenosis, moderate mitral regurgitation, severe left atrial enlargement, moderate tricuspid regurgitation and moderately elevated pulmonary pressures. Small pericardial effusion.  Patient presents with new onset combined systolic/diastolic congestive heart failure and new onset atrial fibrillation. Her hemoglobin is noted to be 8.4 with MCV 78.8 (anemia could also be contributing to her dyspnea and fatigue). We will check iron studies and stool guaiacs. We will ask for GI evaluation as she will likely require upper and lower endoscopy. Once it is clear that there is no GI blood loss we will plan to resume apixaban (CHADsvasc 6). Would then proceed with TEE guided cardioversion. Blood pressure is borderline. Discontinue lisinopril and spironolactone for now. Continue metoprolol for rate control as tolerated by blood pressure. If blood pressure becomes an issue could add amiodarone both for rate control and to help maintain sinus rhythm after cardioversion. Continue gentle diuresis for congestive heart failure. Following discharge would arrange outpatient nuclear study for risk stratification given newly reduced LV function. Note she did have a cardiac catheterization in 2013 that showed no obstructive coronary artery disease. Note TSH and free T4 drawn yesterday in the office pending at the time of this dictation. Olga Millers

## 2015-07-18 NOTE — Consult Note (Signed)
[facility] CONSULT Reason for consult: G.I. bleeding and anemia Referring Physician: ER. PCP: Dr. Nyoka Cowden. Primary G.I.: unassigned  Danielle Benitez is an 81 y.o. female.  HPI: Patient has had worsening shortness of breath and fatigue. She also gained some weight. She has a pacemaker in place. She saw Dr. Nyoka Cowden several days ago and her flu pills were adjusted. She was found to have a new diagnosis of atrial fibrillation with a rapid ventricular response. Her pacemaker interrogation suggested that she had gone into atrial fibrillation 9/30. She was to be started on Eliquis but her hemoglobin had dropped from 13.9 to 8.7 and she was sent to the emergency room. It is felt that she will need to be anticoagulated and cardioverted but this is on hold pending identification of G.I. bleeding source. Patient reports that she has been on pantoprazole which was started recently and had been on some other asked reducing medication prior to that for several weeks. This was for heartburn which was fairly new for her. She's never had colonoscopy but describes what sounds to be a sigmoidoscopy about 30 years ago. The patient denies history of melanoma or hematochezia. She does have a family history of colon cancer in grandparent.  Past Medical History  Diagnosis Date  . Gout   . Osteoarthritis   . Bursitis   . S/P cardiac pacemaker procedure, PPM [employer] REVO placed 01/11/2012 01/11/2012    a. for symptomatic bradycardia. Followed by Dr. Lovena Le.  . Personal history of fall   . Chronic kidney disease, stage II (mild)   . Memory loss   . Anxiety state, unspecified   . Other and unspecified hyperlipidemia   . Anemia, unspecified   . Abnormality of gait   . Depressive disorder, not elsewhere classified   . Rheumatism, unspecified and fibrositis   . Lumbago   . Nonspecific abnormal results of liver function study   . Obesity, unspecified   . Internal hemorrhoids without mention of complication   . Viremia,  unspecified   . Osteoarthrosis, unspecified whether generalized or localized, unspecified site   . Abnormality of gait   . Viremia, unspecified   . Rheumatism, unspecified and fibrositis   . Pain in joint, shoulder region 11/22/2013    leeft   . Pruritus 11/22/2013  . Eczema 11/22/2013  . Full dentures   . HOH (hard of hearing)   . Wears glasses   . Female stress incontinence 05/06/2015  . DM (diabetes mellitus), type 2, uncontrolled, with renal complications ([place]) 0/16/0109  . CKD (chronic kidney disease), stage III   . Aortic stenosis     a. Mild by echo 08/2013.  . Essential hypertension   . Orthostatic hypotension   . Bifascicular block   . Atrial fibrillation ([place])   . Chronic systolic CHF (congestive heart failure) ([place])     a. Dx 07/2015 - EF 35-40%, unclear etiology  . Mild CAD     a. Cath 2013: 20-30% prox RCA.    Past Surgical History  Procedure Laterality Date  . Extremity cyst excision    . Cardiac catherterization  12/03/2003    Dr Rex Kras  . Pacemaker placement  2013  . Colonoscopy    . Orif wrist fracture Right 04/09/2014    Procedure: OPEN REDUCTION INTERNAL FIXATION (ORIF) RIGHT DISPLACED  DISTAL RADIUS  FRACTURE;  Surgeon: Roseanne Kaufman, MD;  Location: [place];  Service: Orthopedics;  Laterality: Right;  . Left heart catheterization with coronary angiogram N/A 01/10/2012  Procedure: LEFT HEART CATHETERIZATION WITH CORONARY ANGIOGRAM;  Surgeon: Pixie Casino, MD;  Location: [facility] CATH LAB;  Service: Cardiovascular;  Laterality: N/A;  . Permanent pacemaker insertion Left 01/11/2012    Procedure: PERMANENT PACEMAKER INSERTION;  Surgeon: Sanda Klein, MD;  Location: [place] CATH LAB;  Service: Cardiovascular;  Laterality: Left;    Family History  Problem Relation Age of Onset  . Breast cancer Mother   . Cancer Mother   . Diabetes Daughter   . Hypertension Daughter   . Cancer Brother     Social History:  reports that she has never smoked. She  does not have any smokeless tobacco history on file. She reports that she does not drink alcohol or use illicit drugs.  Allergies:  Allergies  Allergen Reactions  . Beta Adrenergic Blockers     Couldn't speak or hear  . Jardiance [Empagliflozin]     Rash, kidney issues  . Fluzone [Flu Virus Vaccine] Other (See Comments)    Feeling unwell for a week. Cold symptoms/flu symtoms  . Tetanus Toxoids     UNKNOWN    Medications; Prior to Admission medications   Medication Sig Start Date End Date Taking? Authorizing Provider  albuterol (PROVENTIL HFA;VENTOLIN HFA) 108 (90 BASE) MCG/ACT inhaler Inhale 2 puffs into the lungs every 6 (six) hours as needed for wheezing or shortness of breath. 06/19/15  Yes Lauree Chandler, NP  allopurinol (ZYLOPRIM) 300 MG tablet Take 1 tablet (300 mg total) by mouth daily. 06/20/15  Yes Estill Dooms, MD  apixaban (ELIQUIS) 5 MG TABS tablet Take 1 tablet (5 mg total) by mouth 2 (two) times daily. 07/17/15  Yes Dayna N Ruppel, PA-C  aspirin 81 MG chewable tablet Chew 81 mg by mouth daily.   Yes Historical Provider, MD  atorvastatin (LIPITOR) 40 MG tablet Take 0.5 tablets (20 mg total) by mouth daily at 6 PM. 06/20/15 06/25/16 Yes Estill Dooms, MD  DULoxetine (CYMBALTA) 20 MG capsule Take 1 tablet by mouth daily to help treat anxiety and nerves. 06/20/15  Yes Estill Dooms, MD  furosemide (LASIX) 40 MG tablet One daily to help control fluid retention Patient taking differently: 40 mg daily. One daily to help control fluid retention 07/15/15  Yes Estill Dooms, MD  HYDROcodone-acetaminophen (NORCO/VICODIN) 5-325 MG tablet Take 1 tablet by mouth every 6 (six) hours as needed for moderate pain (joint pain).   Yes Historical Provider, MD  lisinopril (PRINIVIL,ZESTRIL) 20 MG tablet Take 1/2 tablet by mouth once daily for blood pressure 07/17/15  Yes Dayna N Villada, PA-C  metFORMIN (GLUCOPHAGE-XR) 500 MG 24 hr tablet Take two tablets by mouth each morning to control blood  sugar Patient taking differently: Take 1,000 mg by mouth daily with breakfast. Take two tablets by mouth each morning to control blood sugar 04/09/15  Yes Estill Dooms, MD  metoprolol tartrate (LOPRESSOR) 25 MG tablet Take 1 tablet (25 mg total) by mouth 2 (two) times daily. 07/17/15  Yes Dayna N Danker, PA-C  montelukast (SINGULAIR) 10 MG tablet Take 1 tablet (10 mg total) by mouth at bedtime. 06/19/15  Yes Lauree Chandler, NP  [facility] DELICA LANCETS FINE MISC Check blood sugar daily as directed DX E11.65 12/18/14  Yes Estill Dooms, MD  pantoprazole (PROTONIX) 40 MG tablet Take 1 tablet (40 mg total) by mouth daily. 06/19/15  Yes Lauree Chandler, NP  saxagliptin HCl (ONGLYZA) 5 MG TABS tablet Take one tablet by mouth once daily for blood sugar Patient  taking differently: Take 5 mg by mouth daily. Take one tablet by mouth once daily for blood sugar 01/23/15  Yes Estill Dooms, MD  spironolactone (ALDACTONE) 25 MG tablet One daily to reduce fluid retention and to strenghthen the heart Patient taking differently: Take 25 mg by mouth daily. One daily to reduce fluid retention and to strenghthen the heart 07/15/15  Yes Estill Dooms, MD  triamcinolone (KENALOG) 0.025 % cream Apply 1 application topically 4 (four) times daily as needed (itching).   Yes Historical Provider, MD    PRN Meds  Results for orders placed or performed during the hospital encounter of 07/18/15 (from the past 48 hour(s))  Urinalysis, Routine w reflex microscopic (not at [facility])     Status: Abnormal   Collection Time: 07/18/15 10:10 AM  Result Value Ref Range   Color, Urine YELLOW YELLOW   APPearance CLEAR CLEAR   Specific Gravity, Urine 1.012 1.005 - 1.030   pH 5.5 5.0 - 8.0   Glucose, UA NEGATIVE NEGATIVE mg/dL   Hgb urine dipstick NEGATIVE NEGATIVE   Bilirubin Urine NEGATIVE NEGATIVE   Ketones, ur NEGATIVE NEGATIVE mg/dL   Protein, ur 100 (A) NEGATIVE mg/dL   Urobilinogen, UA 0.2 0.0 - 1.0 mg/dL   Nitrite NEGATIVE  NEGATIVE   Leukocytes, UA SMALL (A) NEGATIVE  Urine microscopic-add on     Status: Abnormal   Collection Time: 07/18/15 10:10 AM  Result Value Ref Range   Squamous Epithelial / LPF FEW (A) RARE   WBC, UA 7-10 <3 WBC/hpf   Bacteria, UA RARE RARE  CBC with Differential     Status: Abnormal   Collection Time: 07/18/15 10:23 AM  Result Value Ref Range   WBC 6.7 4.0 - 10.5 K/uL   RBC 3.58 (Benitez) 3.87 - 5.11 MIL/uL   Hemoglobin 8.4 (Benitez) 12.0 - 15.0 g/dL   HCT 28.2 (Benitez) 36.0 - 46.0 %   MCV 78.8 78.0 - 100.0 fL   MCH 23.5 (Benitez) 26.0 - 34.0 pg   MCHC 29.8 (Benitez) 30.0 - 36.0 g/dL   RDW 17.8 (H) 11.5 - 15.5 %   Platelets 178 150 - 400 K/uL   Neutrophils Relative % 76 %   Neutro Abs 5.1 1.7 - 7.7 K/uL   Lymphocytes Relative 15 %   Lymphs Abs 1.0 0.7 - 4.0 K/uL   Monocytes Relative 7 %   Monocytes Absolute 0.4 0.1 - 1.0 K/uL   Eosinophils Relative 2 %   Eosinophils Absolute 0.1 0.0 - 0.7 K/uL   Basophils Relative 0 %   Basophils Absolute 0.0 0.0 - 0.1 K/uL  Comprehensive metabolic panel     Status: Abnormal   Collection Time: 07/18/15 10:23 AM  Result Value Ref Range   Sodium 136 135 - 145 mmol/Benitez   Potassium 5.1 3.5 - 5.1 mmol/Benitez   Chloride 101 101 - 111 mmol/Benitez   CO2 25 22 - 32 mmol/Benitez   Glucose, Bld 133 (H) 65 - 99 mg/dL   BUN 32 (H) 6 - 20 mg/dL   Creatinine, Ser 1.63 (H) 0.44 - 1.00 mg/dL   Calcium 9.2 8.9 - 10.3 mg/dL   Total Protein 6.6 6.5 - 8.1 g/dL   Albumin 3.4 (Benitez) 3.5 - 5.0 g/dL   AST 25 15 - 41 U/Benitez   ALT 11 (Benitez) 14 - 54 U/Benitez   Alkaline Phosphatase 91 38 - 126 U/Benitez   Total Bilirubin 0.4 0.3 - 1.2 mg/dL   GFR calc non Af Amer 28 (Benitez) >60  mL/min   GFR calc Af Amer 33 (Benitez) >60 mL/min    Comment: (NOTE) The eGFR has been calculated using the CKD EPI equation. This calculation has not been validated in all clinical situations. eGFR's persistently <60 mL/min signify possible Chronic Kidney Disease.    Anion gap 10 5 - 15  Brain natriuretic peptide     Status: Abnormal   Collection  Time: 07/18/15 10:23 AM  Result Value Ref Range   B Natriuretic Peptide 1357.3 (H) 0.0 - 100.0 pg/mL  I-Stat Troponin, ED (not at [facility])     Status: None   Collection Time: 07/18/15 10:50 AM  Result Value Ref Range   Troponin i, poc 0.01 0.00 - 0.08 ng/mL   Comment 3            Comment: Due to the release kinetics of cTnI, a negative result within the first hours of the onset of symptoms does not rule out myocardial infarction with certainty. If myocardial infarction is still suspected, repeat the test at appropriate intervals.     Dg Chest 2 View  07/18/2015   CLINICAL DATA:  Worsening shortness of breath  EXAM: CHEST  2 VIEW  COMPARISON:  07/11/2015  FINDINGS: There is mild bilateral interstitial thickening. There is no focal parenchymal opacity. There is no pleural effusion or pneumothorax. There is stable cardiomegaly. There is a dual lead cardiac pacemaker.  There is severe osteoarthritis of the left glenohumeral joint.  IMPRESSION: 1. Stable cardiomegaly with mild pulmonary vascular congestion.   Electronically Signed   By: Kathreen Devoid   On: 07/18/2015 11:08               Blood pressure 87/60, pulse 100, temperature 98.1 F (36.7 C), temperature source Oral, resp. rate 20, height _0  (1.626 m), weight 77.565 kg (171 lb), SpO2 95 %.  Physical exam:   General--pleasant white female no acute distress  ENT-- nonicteric mucous membranes dry  Neck--lymphadenopathy  Heart--regular rate and rhythm without murmurs or gallops  Lungs--clear  Abdomen--soft and nontender  Psych--alert and oriented, answers questions appropriately   Assessment: 1. Symptomatic anemia. Agreed that the etiology of this needs to be determined prior to initiating anticoagulation. The patient is a history of relatively new onset of heartburn over the past several months necessitating several changes in her ass reducing medication. For this reason I think EGD would be the 1st step this is negative she  would need: evaluation. 2. CHF probably secondary to atrial fibrillation and anemia 3. Atrial fibrillation. This is new onset 4. CKD stage III 5. Diabetes type 2     Plan: who will start off with EGD in the morning. Have discussed this in the detail with the patient and her family. If this is negative, will consider colonoscopy. Go ahead and give her liquids tonight. Would continue on empiric PPI therapy   Danielle Benitez,Danielle Benitez 07/18/2015, 4:34 PM   Pager: [phone] If no answer or after hours call [phone]

## 2015-07-18 NOTE — Progress Notes (Signed)
Quick Note:  CBC has resulted, but nothing else has. Hgb is now 8.7, microcytic. The last labs we have are from 03/2014 when Hgb was 13.9 and 11.3 in 11/2013. D/w Dr. Tenny Craw. At this point the patient needs to be managed on inpatient basis to follow blood counts closely and move forward with further workup of anemia. I discussed with triage this AM who will contact patient to come to the ER. (We decided to have her come through the ER so that we could get stat repeat labs drawn there before seeing her to determine further plan.) Dayna Kleve PA-C  ______

## 2015-07-18 NOTE — Progress Notes (Signed)
GI consult appreciated. Note recommendation for liquid diet tonight, will order. In that case since she'll be NPO after midnight will hold Trajendta in AM and just use SSI for now. Gari Trovato Cortright PA-C

## 2015-07-18 NOTE — ED Provider Notes (Signed)
CSN: [id number]     Arrival date & time 07/18/15  0945 History   First MD Initiated Contact with Patient 07/18/15 [phone]     Chief Complaint  Patient presents with  . Shortness of Breath  . Nausea   HPI  Ms. Clopper is an 81 year old female with PMHx of a fib, CHF, aortic stenosis and CKD presenting with increased SOB and nausea. Pt states that she has felt increasing SOB on exertion over the past 1-2 months but that it has rapidly gotten worse over the past week. She is also endorsing increased SOB when lying supine and states that she has to sleep in a recliner now so that she can breath easier. She denies SOB at rest. She is also complaining of nausea x 1 week without vomiting. She reports that her weight has increased by approximately 15 lbs in the past few months and that she is experiencing increased bilateral lower extremity edema. She reports that she occasionally has sharp chest pains around her pacemaker implant site. She states they last approximately 1 second and then resolve. She reports that this morning she had a new type of chest pain. She described it as a pressure in the center of her chest that lasted for a few minutes. This pain has since resolved on its own. Denies exertional chest pain. She states that she felt like she had a cold virus about a month ago and that she has felt more fatigued and "just all around bad" since then. Denies fevers, chills, headaches, dizziness, syncopal events, blurred vision, congestion, rhinorrhea, neck pain, abdominal pain, vomiting, flank pain or dysuria.   Per pt's cardiology appointment yesterday: "2D echo today (per preliminary review by Dr. Tenny Craw) revealed mild AS, EF 25-30%, small pericardial effusion, and she was noted to be in atrial fib with RVR, HR 100-120s. At rest she is around 100-110. HR at PCP's office was documented at 95. Per pacemaker interrogation review, she has been in atrial fib since 07/11/15 with HR 100-120s."  Past Medical History   Diagnosis Date  . Gout   . Osteoarthritis   . Bursitis   . S/P cardiac pacemaker procedure, PPM [employer] REVO placed 01/11/2012 01/11/2012    a. for symptomatic bradycardia. Followed by Dr. Ladona Ridgel.  . Personal history of fall   . Chronic kidney disease, stage II (mild)   . Memory loss   . Anxiety state, unspecified   . Other and unspecified hyperlipidemia   . Anemia, unspecified   . Abnormality of gait   . Depressive disorder, not elsewhere classified   . Rheumatism, unspecified and fibrositis   . Lumbago   . Nonspecific abnormal results of liver function study   . Obesity, unspecified   . Internal hemorrhoids without mention of complication   . Viremia, unspecified   . Osteoarthrosis, unspecified whether generalized or localized, unspecified site   . Abnormality of gait   . Viremia, unspecified   . Rheumatism, unspecified and fibrositis   . Pain in joint, shoulder region 11/22/2013    leeft   . Pruritus 11/22/2013  . Eczema 11/22/2013  . Full dentures   . HOH (hard of hearing)   . Wears glasses   . Female stress incontinence 05/06/2015  . DM (diabetes mellitus), type 2, uncontrolled, with renal complications (HCC) 01/08/2012  . CKD (chronic kidney disease), stage III   . Aortic stenosis     a. Mild by echo 08/2013.  . Essential hypertension   . Orthostatic hypotension   .  Bifascicular block   . Atrial fibrillation (HCC)   . Chronic systolic CHF (congestive heart failure) (HCC)     a. Dx 07/2015 - EF 35-40%, unclear etiology  . Mild CAD     a. Cath 2013: 20-30% prox RCA.   Past Surgical History  Procedure Laterality Date  . Extremity cyst excision    . Cardiac catherterization  12/03/2003    Dr Clarene Duke  . Pacemaker placement  2013  . Colonoscopy    . Orif wrist fracture Right 04/09/2014    Procedure: OPEN REDUCTION INTERNAL FIXATION (ORIF) RIGHT DISPLACED  DISTAL RADIUS  FRACTURE;  Surgeon: Dominica Severin, MD;  Location: [facility];  Service: Orthopedics;   Laterality: Right;  . Left heart catheterization with coronary angiogram N/A 01/10/2012    Procedure: LEFT HEART CATHETERIZATION WITH CORONARY ANGIOGRAM;  Surgeon: Chrystie Nose, MD;  Location: [facility] CATH LAB;  Service: Cardiovascular;  Laterality: N/A;  . Permanent pacemaker insertion Left 01/11/2012    Procedure: PERMANENT PACEMAKER INSERTION;  Surgeon: Thurmon Fair, MD;  Location: [facility] CATH LAB;  Service: Cardiovascular;  Laterality: Left;   Family History  Problem Relation Age of Onset  . Breast cancer Mother   . Cancer Mother   . Diabetes Daughter   . Hypertension Daughter   . Cancer Brother    Social History  Substance Use Topics  . Smoking status: Never Smoker   . Smokeless tobacco: None  . Alcohol Use: No   OB History    No data available     Review of Systems  Constitutional: Negative for fever and chills.  HENT: Negative for congestion and rhinorrhea.   Eyes: Negative for visual disturbance.  Respiratory: Positive for shortness of breath. Negative for cough and wheezing.   Cardiovascular: Positive for chest pain and leg swelling. Negative for palpitations.  Gastrointestinal: Positive for nausea. Negative for vomiting and abdominal pain.  Genitourinary: Negative for dysuria and flank pain.  Musculoskeletal: Negative for back pain and neck pain.  Skin: Negative for rash.  Neurological: Negative for dizziness, syncope, weakness, light-headedness and headaches.  Hematological: Does not bruise/bleed easily.      Allergies  Beta adrenergic blockers; Jardiance; Fluzone; and Tetanus toxoids  Home Medications   Prior to Admission medications   Medication Sig Start Date End Date Taking? Authorizing Provider  albuterol (PROVENTIL HFA;VENTOLIN HFA) 108 (90 BASE) MCG/ACT inhaler Inhale 2 puffs into the lungs every 6 (six) hours as needed for wheezing or shortness of breath. 06/19/15  Yes Sharon Seller, NP  allopurinol (ZYLOPRIM) 300 MG tablet Take 1 tablet (300 mg total) by  mouth daily. 06/20/15  Yes Kimber Relic, MD  apixaban (ELIQUIS) 5 MG TABS tablet Take 1 tablet (5 mg total) by mouth 2 (two) times daily. 07/17/15  Yes Dayna N Chalk, PA-C  aspirin 81 MG chewable tablet Chew 81 mg by mouth daily.   Yes Historical Provider, MD  atorvastatin (LIPITOR) 40 MG tablet Take 0.5 tablets (20 mg total) by mouth daily at 6 PM. 06/20/15 06/25/16 Yes Kimber Relic, MD  DULoxetine (CYMBALTA) 20 MG capsule Take 1 tablet by mouth daily to help treat anxiety and nerves. 06/20/15  Yes Kimber Relic, MD  furosemide (LASIX) 40 MG tablet One daily to help control fluid retention Patient taking differently: 40 mg daily. One daily to help control fluid retention 07/15/15  Yes Kimber Relic, MD  HYDROcodone-acetaminophen (NORCO/VICODIN) 5-325 MG tablet Take 1 tablet by mouth every 6 (six) hours as needed  for moderate pain (joint pain).   Yes Historical Provider, MD  lisinopril (PRINIVIL,ZESTRIL) 20 MG tablet Take 1/2 tablet by mouth once daily for blood pressure 07/17/15  Yes Dayna N Ruggirello, PA-C  metFORMIN (GLUCOPHAGE-XR) 500 MG 24 hr tablet Take two tablets by mouth each morning to control blood sugar Patient taking differently: Take 1,000 mg by mouth daily with breakfast. Take two tablets by mouth each morning to control blood sugar 04/09/15  Yes Kimber Relic, MD  metoprolol tartrate (LOPRESSOR) 25 MG tablet Take 1 tablet (25 mg total) by mouth 2 (two) times daily. 07/17/15  Yes Dayna N Hodder, PA-C  montelukast (SINGULAIR) 10 MG tablet Take 1 tablet (10 mg total) by mouth at bedtime. 06/19/15  Yes Sharon Seller, NP  [facility] DELICA LANCETS FINE MISC Check blood sugar daily as directed DX E11.65 12/18/14  Yes Kimber Relic, MD  pantoprazole (PROTONIX) 40 MG tablet Take 1 tablet (40 mg total) by mouth daily. 06/19/15  Yes Sharon Seller, NP  saxagliptin HCl (ONGLYZA) 5 MG TABS tablet Take one tablet by mouth once daily for blood sugar Patient taking differently: Take 5 mg by mouth daily. Take one  tablet by mouth once daily for blood sugar 01/23/15  Yes Kimber Relic, MD  spironolactone (ALDACTONE) 25 MG tablet One daily to reduce fluid retention and to strenghthen the heart Patient taking differently: Take 25 mg by mouth daily. One daily to reduce fluid retention and to strenghthen the heart 07/15/15  Yes Kimber Relic, MD  triamcinolone (KENALOG) 0.025 % cream Apply 1 application topically 4 (four) times daily as needed (itching).   Yes Historical Provider, MD   BP 87/60 mmHg  Pulse 100  Temp(Src) 98.1 F (36.7 C) (Oral)  Resp 20  Ht  (1.626 m)  Wt 171 lb (77.565 kg)  BMI 29.34 kg/m2  SpO2 95% Physical Exam  Constitutional: She is oriented to person, place, and time. She appears well-developed and well-nourished. No distress.  HENT:  Head: Normocephalic and atraumatic.  Mouth/Throat: Oropharynx is clear and moist. No oropharyngeal exudate.  Eyes: Conjunctivae and EOM are normal. Pupils are equal, round, and reactive to light. Right eye exhibits no discharge. Left eye exhibits no discharge. No scleral icterus.  Neck: Normal range of motion. Neck supple. No JVD present.  Cardiovascular: Normal rate.  An irregularly irregular rhythm present.  No murmur heard. Pedal pulses palpable. Pitting edema present from ankle to knee b/l  Pulmonary/Chest: Effort normal. No respiratory distress. She has no wheezes. She has rales.  Rales in bilateral bases  Abdominal: Soft. Bowel sounds are normal. She exhibits no distension. There is no tenderness. There is no rebound and no guarding.  Musculoskeletal: Normal range of motion.  Moves extremities spontaneously.   Neurological: She is alert and oriented to person, place, and time. No cranial nerve deficit. Coordination normal.  Major muscle groups 5/5 strength. Sensation to light touch intact.   Skin: Skin is warm and dry.  Psychiatric: She has a normal mood and affect. Her behavior is normal.  Nursing note and vitals reviewed.   ED  Course  Procedures (including critical care time) Labs Review Labs Reviewed  CBC WITH DIFFERENTIAL/PLATELET - Abnormal; Notable for the following:    RBC 3.58 (*)    Hemoglobin 8.4 (*)    HCT 28.2 (*)    MCH 23.5 (*)    MCHC 29.8 (*)    RDW 17.8 (*)    All other components within normal  limits  COMPREHENSIVE METABOLIC PANEL - Abnormal; Notable for the following:    Glucose, Bld 133 (*)    BUN 32 (*)    Creatinine, Ser 1.63 (*)    Albumin 3.4 (*)    ALT 11 (*)    GFR calc non Af Amer 28 (*)    GFR calc Af Amer 33 (*)    All other components within normal limits  URINALYSIS, ROUTINE W REFLEX MICROSCOPIC (NOT AT [facility]) - Abnormal; Notable for the following:    Protein, ur 100 (*)    Leukocytes, UA SMALL (*)    All other components within normal limits  BRAIN NATRIURETIC PEPTIDE - Abnormal; Notable for the following:    B Natriuretic Peptide 1357.3 (*)    All other components within normal limits  URINE MICROSCOPIC-ADD ON - Abnormal; Notable for the following:    Squamous Epithelial / LPF FEW (*)    All other components within normal limits  GLUCOSE, CAPILLARY - Abnormal; Notable for the following:    Glucose-Capillary 132 (*)    All other components within normal limits  VITAMIN B12  FOLATE  IRON AND TIBC  FERRITIN  RETICULOCYTES  OCCULT BLOOD X 1 CARD TO LAB, STOOL  TROPONIN I  TROPONIN I  TROPONIN I  BASIC METABOLIC PANEL  CBC  FERRITIN  IRON AND TIBC  I-STAT TROPOININ, ED    Imaging Review Dg Chest 2 View  07/18/2015   CLINICAL DATA:  Worsening shortness of breath  EXAM: CHEST  2 VIEW  COMPARISON:  07/11/2015  FINDINGS: There is mild bilateral interstitial thickening. There is no focal parenchymal opacity. There is no pleural effusion or pneumothorax. There is stable cardiomegaly. There is a dual lead cardiac pacemaker.  There is severe osteoarthritis of the left glenohumeral joint.  IMPRESSION: 1. Stable cardiomegaly with mild pulmonary vascular congestion.    Electronically Signed   By: Elige Ko   On: 07/18/2015 11:08   I have personally reviewed and evaluated these images and lab results as part of my medical decision-making.   EKG Interpretation None      MDM   Final diagnoses:  Symptomatic anemia  Chronic systolic CHF (congestive heart failure) (HCC)  Acute on chronic congestive heart failure, unspecified congestive heart failure type (HCC)   Pt presenting at the urging of her cardiologist for falling Hgb levels. Pt recently diagnosed with CHF and a fib. Pt complaining of worsening SOB, PND and orthopnea. Pt had one episode of chest pressure this morning that resolved before presentation to ED. Pt hypotensive during ED stay; gentle hydration started with 500 mL bolus in ED. Pt in a fib currently without RVR. Increased Creatinine and BUN, likely pre-renal AKI from dehydration. BNP ~1350. Consulted cardiology who will admit the pt.     Alveta Heimlich, PA-C 07/18/15 1804  Melene Plan, DO 07/19/15 [phone]

## 2015-07-18 NOTE — ED Provider Notes (Signed)
Medical screening examination/treatment/procedure(s) were conducted as a shared visit with non-physician practitioner(s) and myself.  I personally evaluated the patient during the encounter.   EKG Interpretation None       See the written copy of this report in the patient's paper medical record.  These results did not interface directly into the electronic medical record and are summarized here.  81 yo F with a chief complaint shortness breath. Patient recently diagnosed with CHF. Has had recent weight gain progressive shortness of breath worse when she lies back flat.  On exam patient is well-appearing and nontoxic. Vital signs answering for hypertension. Patient has had soft blood pressures since her diagnosis. Mentating normally. Given a 500 mL bolus with some mild improvement. Patient appears to have a mild AK I with an elevated BUN. Feel likely prerenal. Patient states she's been having difficulty eating drinking at home.   On exam patient with Rales in the bases. 2+ edema to the knees. No noted S3. JVD halfway up the neck.  Feel likely worsening of patient's underlying CHF. Cardiology consult. Admit    Melene Plan, DO 07/19/15 [phone]

## 2015-07-18 NOTE — ED Notes (Signed)
Pt arrived per EMS with c/o SOB. Pt has a hx of A fib and low hgb. Pt c/o SOB on exertion. [name] MD called this morning to tell pt to come to ER due to low hgb levels. Pt has pacemaker, pt is unable to take blood thinners for A fib. Pt denies chest pain. States she is nauseous and only SOB upon exertion.

## 2015-07-19 ENCOUNTER — Encounter (HOSPITAL_COMMUNITY)
Admission: EM | Disposition: A | Discharge status: Home Health | Payer: Self-pay | Source: Home / Self Care | Attending: Cardiology

## 2015-07-19 DIAGNOSIS — N183 Chronic kidney disease, stage 3 (moderate): Secondary | ICD-10-CM

## 2015-07-19 DIAGNOSIS — I9589 Other hypotension: Secondary | ICD-10-CM

## 2015-07-19 DIAGNOSIS — I481 Persistent atrial fibrillation: Secondary | ICD-10-CM

## 2015-07-19 DIAGNOSIS — N179 Acute kidney failure, unspecified: Secondary | ICD-10-CM

## 2015-07-19 DIAGNOSIS — I10 Essential (primary) hypertension: Secondary | ICD-10-CM

## 2015-07-19 DIAGNOSIS — I5022 Chronic systolic (congestive) heart failure: Secondary | ICD-10-CM

## 2015-07-19 DIAGNOSIS — E1121 Type 2 diabetes mellitus with diabetic nephropathy: Secondary | ICD-10-CM

## 2015-07-19 DIAGNOSIS — Z8679 Personal history of other diseases of the circulatory system: Secondary | ICD-10-CM

## 2015-07-19 LAB — CBC
HCT: 29.3 % — ABNORMAL LOW (ref 36.0–46.0)
Hemoglobin: 8.9 g/dL — ABNORMAL LOW (ref 12.0–15.0)
MCH: 23.9 pg — ABNORMAL LOW (ref 26.0–34.0)
MCHC: 30.4 g/dL (ref 30.0–36.0)
MCV: 78.8 fL (ref 78.0–100.0)
PLATELETS: 175 10*3/uL (ref 150–400)
RBC: 3.72 MIL/uL — ABNORMAL LOW (ref 3.87–5.11)
RDW: 18 % — AB (ref 11.5–15.5)
WBC: 6.2 10*3/uL (ref 4.0–10.5)

## 2015-07-19 LAB — BASIC METABOLIC PANEL
Anion gap: 12 (ref 5–15)
BUN: 40 mg/dL — AB (ref 6–20)
CO2: 23 mmol/L (ref 22–32)
CREATININE: 2.44 mg/dL — AB (ref 0.44–1.00)
Calcium: 9.2 mg/dL (ref 8.9–10.3)
Chloride: 100 mmol/L — ABNORMAL LOW (ref 101–111)
GFR calc Af Amer: 20 mL/min — ABNORMAL LOW (ref >60)
GFR, EST NON AFRICAN AMERICAN: 17 mL/min — AB (ref >60)
GLUCOSE: 145 mg/dL — AB (ref 65–99)
Potassium: 5.5 mmol/L — ABNORMAL HIGH (ref 3.5–5.1)
SODIUM: 135 mmol/L (ref 135–145)

## 2015-07-19 LAB — GLUCOSE, CAPILLARY
GLUCOSE-CAPILLARY: 134 mg/dL — AB (ref 65–99)
GLUCOSE-CAPILLARY: 124 mg/dL — AB (ref 65–99)
GLUCOSE-CAPILLARY: 180 mg/dL — AB (ref 65–99)
GLUCOSE-CAPILLARY: 131 mg/dL — AB (ref 65–99)

## 2015-07-19 LAB — IRON AND TIBC
IRON: 18 ug/dL — AB (ref 28–170)
Saturation Ratios: 4 % — ABNORMAL LOW (ref 10.4–31.8)
TIBC: 431 ug/dL (ref 250–450)
UIBC: 413 ug/dL

## 2015-07-19 LAB — FERRITIN: Ferritin: 9 ng/mL — ABNORMAL LOW (ref 11–307)

## 2015-07-19 LAB — MRSA PCR SCREENING: MRSA by PCR: NEGATIVE

## 2015-07-19 LAB — TROPONIN I: TROPONIN I: 0.04 ng/mL — AB (ref <0.031)

## 2015-07-19 SURGERY — CANCELLED PROCEDURE

## 2015-07-19 MED ORDER — ALLOPURINOL 100 MG PO TABS
200.0000 mg | ORAL_TABLET | Freq: Every day | ORAL | Status: DC
  Administered 2015-07-20 – 2015-07-28 (×9): 200 mg via ORAL
  Filled 2015-07-19 (×9): qty 2

## 2015-07-19 NOTE — Progress Notes (Signed)
[facility] Progress Note  Subjective: The patient was going to have an EGD today by Dr. Randa Evens however it was canceled because he was concerned that overnight her creatinine increased to 2.44, BUN increased to 40, and potassium increased 5.5, and it was removed reported she was hypotensive. No signs of active GI bleeding.  Objective: Vital signs in last 24 hours: Temp:  [97.5 F (36.4 C)-98 F (36.7 C)] 98 F (36.7 C) (10/08 1136) Pulse Rate:  [64-110] 88 (10/08 1136) Resp:  [14-20] 19 (10/08 1136) BP: (65-105)/(49-70) 98/64 mmHg (10/08 1136) SpO2:  [90 %-99 %] 96 % (10/08 1136) Weight change:    PE:  Alert  Heart irregular  Lungs clear  Abdomen soft nontender  Lab Results: Results for orders placed or performed during the hospital encounter of 07/18/15 (from the past 24 hour(s))  Glucose, capillary     Status: Abnormal   Collection Time: 07/18/15  4:47 PM  Result Value Ref Range   Glucose-Capillary 132 (H) 65 - 99 mg/dL  Vitamin B90     Status: None   Collection Time: 07/18/15  6:00 PM  Result Value Ref Range   Vitamin B-12 375 180 - 914 pg/mL  Folate     Status: None   Collection Time: 07/18/15  6:00 PM  Result Value Ref Range   Folate 15.3 >5.9 ng/mL  Iron and TIBC     Status: Abnormal   Collection Time: 07/18/15  6:00 PM  Result Value Ref Range   Iron 13 (L) 28 - 170 ug/dL   TIBC 383 338 - 329 ug/dL   Saturation Ratios 3 (L) 10.4 - 31.8 %   UIBC 410 ug/dL  Ferritin     Status: Abnormal   Collection Time: 07/18/15  6:00 PM  Result Value Ref Range   Ferritin 5 (L) 11 - 307 ng/mL  Reticulocytes     Status: Abnormal   Collection Time: 07/18/15  6:00 PM  Result Value Ref Range   Retic Ct Pct 1.1 0.4 - 3.1 %   RBC. 3.50 (L) 3.87 - 5.11 MIL/uL   Retic Count, Manual 38.5 19.0 - 186.0 K/uL  Troponin I (q 6hr x 3)     Status: None   Collection Time: 07/18/15  6:00 PM  Result Value Ref Range   Troponin I 0.03 <0.031 ng/mL  Glucose, capillary      Status: Abnormal   Collection Time: 07/18/15  9:50 PM  Result Value Ref Range   Glucose-Capillary 149 (H) 65 - 99 mg/dL  Troponin I (q 6hr x 3)     Status: Abnormal   Collection Time: 07/18/15 10:40 PM  Result Value Ref Range   Troponin I 0.04 (H) <0.031 ng/mL  Troponin I (q 6hr x 3)     Status: Abnormal   Collection Time: 07/19/15  5:00 AM  Result Value Ref Range   Troponin I 0.04 (H) <0.031 ng/mL  Basic metabolic panel     Status: Abnormal   Collection Time: 07/19/15  5:00 AM  Result Value Ref Range   Sodium 135 135 - 145 mmol/L   Potassium 5.5 (H) 3.5 - 5.1 mmol/L   Chloride 100 (L) 101 - 111 mmol/L   CO2 23 22 - 32 mmol/L   Glucose, Bld 145 (H) 65 - 99 mg/dL   BUN 40 (H) 6 - 20 mg/dL   Creatinine, Ser 1.91 (H) 0.44 - 1.00 mg/dL   Calcium 9.2 8.9 - 66.0 mg/dL   GFR calc non Af  Amer 17 (L) >60 mL/min   GFR calc Af Amer 20 (L) >60 mL/min   Anion gap 12 5 - 15  CBC     Status: Abnormal   Collection Time: 07/19/15  5:00 AM  Result Value Ref Range   WBC 6.2 4.0 - 10.5 K/uL   RBC 3.72 (L) 3.87 - 5.11 MIL/uL   Hemoglobin 8.9 (L) 12.0 - 15.0 g/dL   HCT 69.6 (L) 29.5 - 28.4 %   MCV 78.8 78.0 - 100.0 fL   MCH 23.9 (L) 26.0 - 34.0 pg   MCHC 30.4 30.0 - 36.0 g/dL   RDW 13.2 (H) 44.0 - 10.2 %   Platelets 175 150 - 400 K/uL  Ferritin     Status: Abnormal   Collection Time: 07/19/15  5:00 AM  Result Value Ref Range   Ferritin 9 (L) 11 - 307 ng/mL  Iron and TIBC     Status: Abnormal   Collection Time: 07/19/15  5:00 AM  Result Value Ref Range   Iron 18 (L) 28 - 170 ug/dL   TIBC 725 366 - 440 ug/dL   Saturation Ratios 4 (L) 10.4 - 31.8 %   UIBC 413 ug/dL  MRSA PCR Screening     Status: None   Collection Time: 07/19/15  5:31 AM  Result Value Ref Range   MRSA by PCR NEGATIVE NEGATIVE  Glucose, capillary     Status: Abnormal   Collection Time: 07/19/15  7:38 AM  Result Value Ref Range   Glucose-Capillary 134 (H) 65 - 99 mg/dL  Glucose, capillary     Status: Abnormal    Collection Time: 07/19/15 11:31 AM  Result Value Ref Range   Glucose-Capillary 124 (H) 65 - 99 mg/dL    Studies/Results: No results found.    Assessment: Anemia  Atrial fibrillation  Plan:   Medical management for now. Postpone the EGD. Cardiology clearance prior to EGD.    Gwenevere Abbot 07/19/2015, 11:47 AM  Pager: [phone] If no answer or after 5 PM call [phone] Lab Results  Component Value Date   HGB 8.9* 07/19/2015   HGB 8.4* 07/18/2015   HGB 8.7* 07/17/2015   HCT 29.3* 07/19/2015   HCT 28.2* 07/18/2015   HCT 29.1* 07/17/2015   ALKPHOS 91 07/18/2015   ALKPHOS 87 07/17/2015   ALKPHOS 124* 02/25/2014   AST 25 07/18/2015   AST 18 07/17/2015   AST 17 02/25/2014   ALT 11* 07/18/2015   ALT 7 07/17/2015   ALT 8 02/25/2014

## 2015-07-19 NOTE — Progress Notes (Signed)
Responded to room to fix telemetry leads and re-learn arrhythmia settings for false VT alarms. Noted a low SBP in 70's on bedside monitor. BP cuff was malpositioned, making for strong likelihood of outlier in reading. Re-positioned cuff and then BP was re-taken.  Of note:  patient's BP has characteristically been "soft" since admission with some SBPs in the 80s.  Recent vitals: Filed Vitals:   07/18/15 1800 07/18/15 2100 07/19/15 0048 07/19/15 0324  BP: 99/58 105/70 100/68 80/58  Pulse:  87 88   Temp:  98 F (36.7 C) 97.7 F (36.5 C)   TempSrc:  Oral Oral   Resp:  16 16 16   Height:      Weight:      SpO2:  94% 96%    Currently, patient is mildly confused, but otherwise asymptomatic (received ambien 5mg  PO at HS for sleep).  Spoke with Dr. Sherren Kerns, MD on-call for cardiology. Given acute heart failure, he does not recommend bolus fluids at this time to support BP.  AM labs to be drawn shortly. Will notify MD of any significant changes.  Continuing to monitor closely.

## 2015-07-19 NOTE — Progress Notes (Signed)
DAILY PROGRESS NOTE  Subjective:  Events noted overnight. EGD cancelled due to acute rise in creatinine and hypotension. Creatinine increased from 1.63 to 2.44. Troponin has been borderline elevated at 0.04. BNP is elevated at 1357. H/H appears stable. K is elevated today at 5.5. Very little urine output recorded. Review of BP shows persistent hypotension yesterday and overnight BP in the 60-80 systolic range.  Objective:  Temp:  [97.5 F (36.4 C)-98 F (36.7 C)] 98 F (36.7 C) (10/08 1136) Pulse Rate:  [64-110] 88 (10/08 1136) Resp:  [14-20] 19 (10/08 1136) BP: (65-105)/(49-70) 98/64 mmHg (10/08 1136) SpO2:  [94 %-99 %] 96 % (10/08 1136) Weight change:   Intake/Output from previous day: 10/07 0701 - 10/08 0700 In: 181.3 [I.V.:181.3] Out: 250 [Urine:250]  Intake/Output from this shift:    Medications: Current Facility-Administered Medications  Medication Dose Route Frequency Provider Last Rate Last Dose  . 0.9 %  sodium chloride infusion  250 mL Intravenous PRN Dayna N Bunt, PA-C      . 0.9 %  sodium chloride infusion   Intravenous Continuous Carman Ching, MD 20 mL/hr at 07/18/15 2155    . acetaminophen (TYLENOL) tablet 650 mg  650 mg Oral Q4H PRN Dayna N Riecke, PA-C   650 mg at 07/19/15 1000  . albuterol (PROVENTIL) (2.5 MG/3ML) 0.083% nebulizer solution 2.5 mg  2.5 mg Nebulization Q6H PRN Lewayne Bunting, MD      . Melene Muller ON 07/20/2015] allopurinol (ZYLOPRIM) tablet 200 mg  200 mg Oral Daily Brittainy M Simmons, PA-C      . atorvastatin (LIPITOR) tablet 20 mg  20 mg Oral q1800 Dayna N Ringenberg, PA-C   20 mg at 07/18/15 1802  . DULoxetine (CYMBALTA) DR capsule 20 mg  20 mg Oral Daily Dayna N Bisesi, PA-C   20 mg at 07/19/15 1228  . furosemide (LASIX) tablet 20 mg  20 mg Oral Daily Dayna N Henard, PA-C   20 mg at 07/19/15 0948  . insulin aspart (novoLOG) injection 0-9 Units  0-9 Units Subcutaneous TID WC Dayna N Muzio, PA-C   1 Units at 07/19/15 1228  . metoprolol tartrate  (LOPRESSOR) tablet 25 mg  25 mg Oral BID Dayna N Duesing, PA-C   25 mg at 07/18/15 2029  . montelukast (SINGULAIR) tablet 10 mg  10 mg Oral QHS Dayna N Canelo, PA-C   10 mg at 07/18/15 2029  . ondansetron (ZOFRAN) injection 4 mg  4 mg Intravenous Q6H PRN Dayna N Wolfrey, PA-C      . pantoprazole (PROTONIX) EC tablet 40 mg  40 mg Oral BID Dayna N Ohanesian, PA-C   40 mg at 07/19/15 0948  . sodium chloride 0.9 % injection 3 mL  3 mL Intravenous Q12H Dayna N Gebert, PA-C   3 mL at 07/18/15 2033  . sodium chloride 0.9 % injection 3 mL  3 mL Intravenous PRN Laurann Montana, PA-C        Physical Exam: General appearance: alert, no distress and seated upright in bed Neck: JVD - 4 cm above sternal notch and no carotid bruit Lungs: diminished breath sounds bibasilar Heart: irregularly irregular rhythm Abdomen: soft, non-tender; bowel sounds normal; no masses,  no organomegaly Extremities: extremities normal, atraumatic, no cyanosis or edema Pulses: 2+ and symmetric Skin: pale, cool, dry Neurologic: Grossly normal Psych: Pleasant  Lab Results: Results for orders placed or performed during the hospital encounter of 07/18/15 (from the past 48 hour(s))  Urinalysis, Routine w reflex microscopic (not  at [facility])     Status: Abnormal   Collection Time: 07/18/15 10:10 AM  Result Value Ref Range   Color, Urine YELLOW YELLOW   APPearance CLEAR CLEAR   Specific Gravity, Urine 1.012 1.005 - 1.030   pH 5.5 5.0 - 8.0   Glucose, UA NEGATIVE NEGATIVE mg/dL   Hgb urine dipstick NEGATIVE NEGATIVE   Bilirubin Urine NEGATIVE NEGATIVE   Ketones, ur NEGATIVE NEGATIVE mg/dL   Protein, ur 100 (A) NEGATIVE mg/dL   Urobilinogen, UA 0.2 0.0 - 1.0 mg/dL   Nitrite NEGATIVE NEGATIVE   Leukocytes, UA SMALL (A) NEGATIVE  Urine microscopic-add on     Status: Abnormal   Collection Time: 07/18/15 10:10 AM  Result Value Ref Range   Squamous Epithelial / LPF FEW (A) RARE   WBC, UA 7-10 <3 WBC/hpf   Bacteria, UA RARE RARE  CBC with  Differential     Status: Abnormal   Collection Time: 07/18/15 10:23 AM  Result Value Ref Range   WBC 6.7 4.0 - 10.5 K/uL   RBC 3.58 (L) 3.87 - 5.11 MIL/uL   Hemoglobin 8.4 (L) 12.0 - 15.0 g/dL   HCT 28.2 (L) 36.0 - 46.0 %   MCV 78.8 78.0 - 100.0 fL   MCH 23.5 (L) 26.0 - 34.0 pg   MCHC 29.8 (L) 30.0 - 36.0 g/dL   RDW 17.8 (H) 11.5 - 15.5 %   Platelets 178 150 - 400 K/uL   Neutrophils Relative % 76 %   Neutro Abs 5.1 1.7 - 7.7 K/uL   Lymphocytes Relative 15 %   Lymphs Abs 1.0 0.7 - 4.0 K/uL   Monocytes Relative 7 %   Monocytes Absolute 0.4 0.1 - 1.0 K/uL   Eosinophils Relative 2 %   Eosinophils Absolute 0.1 0.0 - 0.7 K/uL   Basophils Relative 0 %   Basophils Absolute 0.0 0.0 - 0.1 K/uL  Comprehensive metabolic panel     Status: Abnormal   Collection Time: 07/18/15 10:23 AM  Result Value Ref Range   Sodium 136 135 - 145 mmol/L   Potassium 5.1 3.5 - 5.1 mmol/L   Chloride 101 101 - 111 mmol/L   CO2 25 22 - 32 mmol/L   Glucose, Bld 133 (H) 65 - 99 mg/dL   BUN 32 (H) 6 - 20 mg/dL   Creatinine, Ser 1.63 (H) 0.44 - 1.00 mg/dL   Calcium 9.2 8.9 - 10.3 mg/dL   Total Protein 6.6 6.5 - 8.1 g/dL   Albumin 3.4 (L) 3.5 - 5.0 g/dL   AST 25 15 - 41 U/L   ALT 11 (L) 14 - 54 U/L   Alkaline Phosphatase 91 38 - 126 U/L   Total Bilirubin 0.4 0.3 - 1.2 mg/dL   GFR calc non Af Amer 28 (L) >60 mL/min   GFR calc Af Amer 33 (L) >60 mL/min    Comment: (NOTE) The eGFR has been calculated using the CKD EPI equation. This calculation has not been validated in all clinical situations. eGFR's persistently <60 mL/min signify possible Chronic Kidney Disease.    Anion gap 10 5 - 15  Brain natriuretic peptide     Status: Abnormal   Collection Time: 07/18/15 10:23 AM  Result Value Ref Range   B Natriuretic Peptide 1357.3 (H) 0.0 - 100.0 pg/mL  I-Stat Troponin, ED (not at [facility])     Status: None   Collection Time: 07/18/15 10:50 AM  Result Value Ref Range   Troponin i, poc 0.01 0.00 - 0.08 ng/mL  Comment 3            Comment: Due to the release kinetics of cTnI, a negative result within the first hours of the onset of symptoms does not rule out myocardial infarction with certainty. If myocardial infarction is still suspected, repeat the test at appropriate intervals.   Glucose, capillary     Status: Abnormal   Collection Time: 07/18/15  4:47 PM  Result Value Ref Range   Glucose-Capillary 132 (H) 65 - 99 mg/dL  Vitamin B12     Status: None   Collection Time: 07/18/15  6:00 PM  Result Value Ref Range   Vitamin B-12 375 180 - 914 pg/mL    Comment: (NOTE) This assay is not validated for testing neonatal or myeloproliferative syndrome specimens for Vitamin B12 levels.   Folate     Status: None   Collection Time: 07/18/15  6:00 PM  Result Value Ref Range   Folate 15.3 >5.9 ng/mL  Iron and TIBC     Status: Abnormal   Collection Time: 07/18/15  6:00 PM  Result Value Ref Range   Iron 13 (L) 28 - 170 ug/dL   TIBC 423 250 - 450 ug/dL   Saturation Ratios 3 (L) 10.4 - 31.8 %   UIBC 410 ug/dL  Ferritin     Status: Abnormal   Collection Time: 07/18/15  6:00 PM  Result Value Ref Range   Ferritin 5 (L) 11 - 307 ng/mL  Reticulocytes     Status: Abnormal   Collection Time: 07/18/15  6:00 PM  Result Value Ref Range   Retic Ct Pct 1.1 0.4 - 3.1 %   RBC. 3.50 (L) 3.87 - 5.11 MIL/uL   Retic Count, Manual 38.5 19.0 - 186.0 K/uL  Troponin I (q 6hr x 3)     Status: None   Collection Time: 07/18/15  6:00 PM  Result Value Ref Range   Troponin I 0.03 <0.031 ng/mL    Comment:        NO INDICATION OF MYOCARDIAL INJURY.   Glucose, capillary     Status: Abnormal   Collection Time: 07/18/15  9:50 PM  Result Value Ref Range   Glucose-Capillary 149 (H) 65 - 99 mg/dL  Troponin I (q 6hr x 3)     Status: Abnormal   Collection Time: 07/18/15 10:40 PM  Result Value Ref Range   Troponin I 0.04 (H) <0.031 ng/mL    Comment:        PERSISTENTLY INCREASED TROPONIN VALUES IN THE RANGE OF  0.04-0.49 ng/mL CAN BE SEEN IN:       -UNSTABLE ANGINA       -CONGESTIVE HEART FAILURE       -MYOCARDITIS       -CHEST TRAUMA       -ARRYHTHMIAS       -LATE PRESENTING MYOCARDIAL INFARCTION       -COPD   CLINICAL FOLLOW-UP RECOMMENDED.   Troponin I (q 6hr x 3)     Status: Abnormal   Collection Time: 07/19/15  5:00 AM  Result Value Ref Range   Troponin I 0.04 (H) <0.031 ng/mL    Comment:        PERSISTENTLY INCREASED TROPONIN VALUES IN THE RANGE OF 0.04-0.49 ng/mL CAN BE SEEN IN:       -UNSTABLE ANGINA       -CONGESTIVE HEART FAILURE       -MYOCARDITIS       -CHEST TRAUMA       -ARRYHTHMIAS       -  LATE PRESENTING MYOCARDIAL INFARCTION       -COPD   CLINICAL FOLLOW-UP RECOMMENDED.   Basic metabolic panel     Status: Abnormal   Collection Time: 07/19/15  5:00 AM  Result Value Ref Range   Sodium 135 135 - 145 mmol/L   Potassium 5.5 (H) 3.5 - 5.1 mmol/L   Chloride 100 (L) 101 - 111 mmol/L   CO2 23 22 - 32 mmol/L   Glucose, Bld 145 (H) 65 - 99 mg/dL   BUN 40 (H) 6 - 20 mg/dL   Creatinine, Ser 2.44 (H) 0.44 - 1.00 mg/dL   Calcium 9.2 8.9 - 10.3 mg/dL   GFR calc non Af Amer 17 (L) >60 mL/min   GFR calc Af Amer 20 (L) >60 mL/min    Comment: (NOTE) The eGFR has been calculated using the CKD EPI equation. This calculation has not been validated in all clinical situations. eGFR's persistently <60 mL/min signify possible Chronic Kidney Disease.    Anion gap 12 5 - 15  CBC     Status: Abnormal   Collection Time: 07/19/15  5:00 AM  Result Value Ref Range   WBC 6.2 4.0 - 10.5 K/uL   RBC 3.72 (L) 3.87 - 5.11 MIL/uL   Hemoglobin 8.9 (L) 12.0 - 15.0 g/dL   HCT 29.3 (L) 36.0 - 46.0 %   MCV 78.8 78.0 - 100.0 fL   MCH 23.9 (L) 26.0 - 34.0 pg   MCHC 30.4 30.0 - 36.0 g/dL   RDW 18.0 (H) 11.5 - 15.5 %   Platelets 175 150 - 400 K/uL    Comment: SPECIMEN CHECKED FOR CLOTS REPEATED TO VERIFY CONSISTENT WITH PREVIOUS RESULT   Ferritin     Status: Abnormal   Collection Time:  07/19/15  5:00 AM  Result Value Ref Range   Ferritin 9 (L) 11 - 307 ng/mL  Iron and TIBC     Status: Abnormal   Collection Time: 07/19/15  5:00 AM  Result Value Ref Range   Iron 18 (L) 28 - 170 ug/dL   TIBC 431 250 - 450 ug/dL   Saturation Ratios 4 (L) 10.4 - 31.8 %   UIBC 413 ug/dL  MRSA PCR Screening     Status: None   Collection Time: 07/19/15  5:31 AM  Result Value Ref Range   MRSA by PCR NEGATIVE NEGATIVE    Comment:        The GeneXpert MRSA Assay (FDA approved for NASAL specimens only), is one component of a comprehensive MRSA colonization surveillance program. It is not intended to diagnose MRSA infection nor to guide or monitor treatment for MRSA infections.   Glucose, capillary     Status: Abnormal   Collection Time: 07/19/15  7:38 AM  Result Value Ref Range   Glucose-Capillary 134 (H) 65 - 99 mg/dL  Glucose, capillary     Status: Abnormal   Collection Time: 07/19/15 11:31 AM  Result Value Ref Range   Glucose-Capillary 124 (H) 65 - 99 mg/dL    Imaging: Dg Chest 2 View  07/18/2015   CLINICAL DATA:  Worsening shortness of breath  EXAM: CHEST  2 VIEW  COMPARISON:  07/11/2015  FINDINGS: There is mild bilateral interstitial thickening. There is no focal parenchymal opacity. There is no pleural effusion or pneumothorax. There is stable cardiomegaly. There is a dual lead cardiac pacemaker.  There is severe osteoarthritis of the left glenohumeral joint.  IMPRESSION: 1. Stable cardiomegaly with mild pulmonary vascular congestion.   Electronically Signed  By: Kathreen Devoid   On: 07/18/2015 11:08    Assessment:  1. Active Problems: 2.   Mild CAD - 20-30% RCA in 2013 3.   Mild aortic stenosis 4.   Essential hypertension 5.   Symptomatic anemia 6.   Atrial fibrillation - developed 07/11/15 by PPM interrogation 7.   Moderate mitral regurgitation 8.   Moderate tricuspid regurgitation 9.   Chronic systolic CHF (congestive heart failure) ([place]) 10.   History of pacemaker -  Medtronic 11.   Microcytic anemia 12.   Acute renal failure superimposed on stage 3 chronic kidney disease ([place]) 13.   Diabetes mellitus with nephropathy ([place]) 14.   Plan: 1. Symptomatic anemia - appreciate GI evaluation, plan EGD once BP and creatinine improved.  2. Recently diagnosed acute systolic CHF EF 17-35% - off of ACE-I and aldactone. Hold metoprolol due to hypotension. Not clear that she is diuresing much, however, hold off on additional lasix due to rising creatinine.   3. Recently diagnosed atrial fib (in this since 07/11/15) - Eliquis on hold due to anemia. Cannot pursue cardioversion safely unless she can be anticoagulated.  4. AKI on CKD stage III - acute worsening overnight. May be pre-renal and possible ATN secondary to hypotension. D/c b-blocker to allow renal perfusion. May need low dose dopamine to aid in diuresis.  5. Essential HTN with history of orthostasis, now with hypotension - d/c bp meds. Monitor BP closely, may need to start on low dose dopamine.  6. Valvular heart disease with mod MR, mod TR, mild AS - follow clinically.  7. Small pericardial effusion - follow clinically. She has not had classic signs of pericarditis.  8. DM with nephro complications - hold metformin due to renal insufficiency. Continue onglyza. Add SSI.  9. Hyperkalemia - mildly elevated at 5.5. Hold exogenous potassium, switch to renal diet. Monitor in the setting of possible ATN.  Time Spent Directly with Patient:  30 minutes  Length of Stay:  LOS: 1 day   Pixie Casino, MD, [facility] Attending Cardiologist [place] 07/19/2015, 12:38 PM

## 2015-07-20 DIAGNOSIS — I350 Nonrheumatic aortic (valve) stenosis: Secondary | ICD-10-CM

## 2015-07-20 LAB — BASIC METABOLIC PANEL
ANION GAP: 13 (ref 5–15)
BUN: 49 mg/dL — ABNORMAL HIGH (ref 6–20)
CO2: 20 mmol/L — ABNORMAL LOW (ref 22–32)
Calcium: 8.9 mg/dL (ref 8.9–10.3)
Chloride: 98 mmol/L — ABNORMAL LOW (ref 101–111)
Creatinine, Ser: 3.41 mg/dL — ABNORMAL HIGH (ref 0.44–1.00)
GFR calc Af Amer: 14 mL/min — ABNORMAL LOW (ref >60)
GFR, EST NON AFRICAN AMERICAN: 12 mL/min — AB (ref >60)
GLUCOSE: 136 mg/dL — AB (ref 65–99)
POTASSIUM: 5.6 mmol/L — AB (ref 3.5–5.1)
Sodium: 131 mmol/L — ABNORMAL LOW (ref 135–145)

## 2015-07-20 LAB — CREATININE, URINE, RANDOM: Creatinine, Urine: 148.42 mg/dL

## 2015-07-20 LAB — CBC
HEMATOCRIT: 29 % — AB (ref 36.0–46.0)
HEMOGLOBIN: 8.6 g/dL — AB (ref 12.0–15.0)
MCH: 23.5 pg — ABNORMAL LOW (ref 26.0–34.0)
MCHC: 29.7 g/dL — AB (ref 30.0–36.0)
MCV: 79.2 fL (ref 78.0–100.0)
Platelets: 190 10*3/uL (ref 150–400)
RBC: 3.66 MIL/uL — ABNORMAL LOW (ref 3.87–5.11)
RDW: 18.3 % — ABNORMAL HIGH (ref 11.5–15.5)
WBC: 6.1 10*3/uL (ref 4.0–10.5)

## 2015-07-20 LAB — GLUCOSE, CAPILLARY
GLUCOSE-CAPILLARY: 176 mg/dL — AB (ref 65–99)
GLUCOSE-CAPILLARY: 155 mg/dL — AB (ref 65–99)
Glucose-Capillary: 130 mg/dL — ABNORMAL HIGH (ref 65–99)
Glucose-Capillary: 158 mg/dL — ABNORMAL HIGH (ref 65–99)

## 2015-07-20 LAB — SODIUM, URINE, RANDOM: SODIUM UR: 41 mmol/L

## 2015-07-20 LAB — OCCULT BLOOD X 1 CARD TO LAB, STOOL: Fecal Occult Bld: NEGATIVE

## 2015-07-20 MED ORDER — SODIUM POLYSTYRENE SULFONATE 15 GM/60ML PO SUSP
30.0000 g | Freq: Once | ORAL | Status: AC
  Administered 2015-07-20: 30 g via ORAL
  Filled 2015-07-20: qty 120

## 2015-07-20 MED ORDER — SODIUM CHLORIDE 0.9 % IV SOLN
INTRAVENOUS | Status: DC
  Administered 2015-07-20: 1000 mL via INTRAVENOUS

## 2015-07-20 NOTE — Progress Notes (Signed)
Hemoglobin and hematocrit are stable. Appreciate cardiology's input. We will await cardiology clearance prior to endoscopic evaluation. We will reevaluate tomorrow.

## 2015-07-20 NOTE — Consult Note (Addendum)
Renal Service Consult Note [facility]  Danielle Benitez 07/20/2015 Danielle Benitez Requesting Physician:  Dr Rennis Golden  Reason for Consult:  Acute on CRF HPI: The patient is a 81 y.o. year-old with hx of DM2, CKD, HTN, aortic stenosis, afib, systolic CHF and mild CAD w PPM.  Had been followed by PCP for 5-6 wks for SOB, thought to have viral illness with bronchitis. THne developed PND/ orthopnea, had to sleep in recliner.   15 lb wt gain. Saw caridiology in referral and ECHO showed EF 35-40% and she was in afib.  MTP and Eliquis were added, lisinopril decreased. On 10/7 then became more OSB and had chest pressure. Went to ED on 10.7 with SOB and found to have low Hb. Gen weakness and nausea, creat up at 1.63. CXR vasc congestion. Given 500 cc NS and admitted. BP's have been low in the 80's - 90's, creat up to 2.4 yesterday and 3.4 today. Asked to see for AKI.     Baseline creat 1.05 in July 2016, 1.75 on admission 07/17/15.  UA clear, 1.012, 100 prot, 7-10 wbc, no rbcs or cast.   Home meds > allopurinol, apixaban, aspirin, atorvastatin, duloxetine, furosemide, norco, lisinopril, metformin, metoprolol tartrate, montelukast, pantoprazole, saxagliptin, spironolactone   Acei held on admission.  Got lasix 20 po x 1 on 10/8 and on 10/9. Got MTP on 10/7, held since. NO IV contrast or nsaids.  UOP 250 cc on 10.7, none recorded since then.  Admit weight was 77.5, today is 78.6kg.   CXR vasc congestion on 10.7. ECHO w EF 35-40%.  Mild AS, mod MR, mod TR, severely dilated LA, PA pressure 48 mm Hg. UA clear, 1.012, 100 prot, 7-10 wbc, no rbcs or cast.    Patient is w/o compalitns now. No SOB, CP, no orthopnea.  +DOE.  +weak, no abd pain, n/v/d.   Past Medical History  Past Medical History  Diagnosis Date  . Gout   . Osteoarthritis   . Bursitis   . S/P cardiac pacemaker procedure, PPM Medtronic REVO placed 01/11/2012 01/11/2012    a. for symptomatic bradycardia. Followed by Dr. Ladona Ridgel.  .  Personal history of fall   . Chronic kidney disease, stage II (mild)   . Memory loss   . Anxiety state, unspecified   . Other and unspecified hyperlipidemia   . Anemia, unspecified   . Abnormality of gait   . Depressive disorder, not elsewhere classified   . Rheumatism, unspecified and fibrositis   . Lumbago   . Nonspecific abnormal results of liver function study   . Obesity, unspecified   . Internal hemorrhoids without mention of complication   . Viremia, unspecified   . Osteoarthrosis, unspecified whether generalized or localized, unspecified site   . Abnormality of gait   . Viremia, unspecified   . Rheumatism, unspecified and fibrositis   . Pain in joint, shoulder region 11/22/2013    leeft   . Pruritus 11/22/2013  . Eczema 11/22/2013  . Full dentures   . HOH (hard of hearing)   . Wears glasses   . Female stress incontinence 05/06/2015  . DM (diabetes mellitus), type 2, uncontrolled, with renal complications (HCC) 01/08/2012  . CKD (chronic kidney disease), stage III   . Aortic stenosis     a. Mild by echo 08/2013.  . Essential hypertension   . Orthostatic hypotension   . Bifascicular block   . Atrial fibrillation (HCC)   . Chronic systolic CHF (congestive heart failure) (HCC)  a. Dx 07/2015 - EF 35-40%, unclear etiology  . Mild CAD     a. Cath 2013: 20-30% prox RCA.   Past Surgical History  Past Surgical History  Procedure Laterality Date  . Extremity cyst excision    . Cardiac catherterization  12/03/2003    Dr Clarene Duke  . Pacemaker placement  2013  . Colonoscopy    . Orif wrist fracture Right 04/09/2014    Procedure: OPEN REDUCTION INTERNAL FIXATION (ORIF) RIGHT DISPLACED  DISTAL RADIUS  FRACTURE;  Surgeon: Dominica Severin, MD;  Location: [facility];  Service: Orthopedics;  Laterality: Right;  . Left heart catheterization with coronary angiogram N/A 01/10/2012    Procedure: LEFT HEART CATHETERIZATION WITH CORONARY ANGIOGRAM;  Surgeon: Chrystie Nose,  MD;  Location[facility] CATH LAB;  Service: Cardiovascular;  Laterality: N/A;  . Permanent pacemaker insertion Left 01/11/2012    Procedure: PERMANENT PACEMAKER INSERTION;  Surgeon: Thurmon Fair, MD;  Location: [facility] CATH LAB;  Service: Cardiovascular;  Laterality: Left;   Family History  Family History  Problem Relation Age of Onset  . Breast cancer Mother   . Cancer Mother   . Diabetes Daughter   . Hypertension Daughter   . Cancer Brother    Social History  reports that she has never smoked. She does not have any smokeless tobacco history on file. She reports that she does not drink alcohol or use illicit drugs. Allergies  Allergies  Allergen Reactions  . Beta Adrenergic Blockers     Couldn't speak or hear  . Jardiance [Empagliflozin]     Rash, kidney issues  . Fluzone [Flu Virus Vaccine] Other (See Comments)    Feeling unwell for a week. Cold symptoms/flu symtoms  . Tetanus Toxoids     UNKNOWN   Home medications Prior to Admission medications   Medication Sig Start Date End Date Taking? Authorizing Provider  albuterol (PROVENTIL HFA;VENTOLIN HFA) 108 (90 BASE) MCG/ACT inhaler Inhale 2 puffs into the lungs every 6 (six) hours as needed for wheezing or shortness of breath. 06/19/15  Yes Sharon Seller, NP  allopurinol (ZYLOPRIM) 300 MG tablet Take 1 tablet (300 mg total) by mouth daily. 06/20/15  Yes Kimber Relic, MD  apixaban (ELIQUIS) 5 MG TABS tablet Take 1 tablet (5 mg total) by mouth 2 (two) times daily. 07/17/15  Yes Dayna N Bottari, PA-C  aspirin 81 MG chewable tablet Chew 81 mg by mouth daily.   Yes Historical Provider, MD  atorvastatin (LIPITOR) 40 MG tablet Take 0.5 tablets (20 mg total) by mouth daily at 6 PM. 06/20/15 06/25/16 Yes Kimber Relic, MD  DULoxetine (CYMBALTA) 20 MG capsule Take 1 tablet by mouth daily to help treat anxiety and nerves. 06/20/15  Yes Kimber Relic, MD  furosemide (LASIX) 40 MG tablet One daily to help control fluid retention Patient taking differently: 40  mg daily. One daily to help control fluid retention 07/15/15  Yes Kimber Relic, MD  HYDROcodone-acetaminophen (NORCO/VICODIN) 5-325 MG tablet Take 1 tablet by mouth every 6 (six) hours as needed for moderate pain (joint pain).   Yes Historical Provider, MD  lisinopril (PRINIVIL,ZESTRIL) 20 MG tablet Take 1/2 tablet by mouth once daily for blood pressure 07/17/15  Yes Dayna N Monday, PA-C  metFORMIN (GLUCOPHAGE-XR) 500 MG 24 hr tablet Take two tablets by mouth each morning to control blood sugar Patient taking differently: Take 1,000 mg by mouth daily with breakfast. Take two tablets by mouth each morning to control blood sugar 04/09/15  Yes Kimber Relic, MD  metoprolol tartrate (LOPRESSOR) 25 MG tablet Take 1 tablet (25 mg total) by mouth 2 (two) times daily. 07/17/15  Yes Dayna N Blatter, PA-C  montelukast (SINGULAIR) 10 MG tablet Take 1 tablet (10 mg total) by mouth at bedtime. 06/19/15  Yes Sharon Seller, NP  [facility] DELICA LANCETS FINE MISC Check blood sugar daily as directed DX E11.65 12/18/14  Yes Kimber Relic, MD  pantoprazole (PROTONIX) 40 MG tablet Take 1 tablet (40 mg total) by mouth daily. 06/19/15  Yes Sharon Seller, NP  saxagliptin HCl (ONGLYZA) 5 MG TABS tablet Take one tablet by mouth once daily for blood sugar Patient taking differently: Take 5 mg by mouth daily. Take one tablet by mouth once daily for blood sugar 01/23/15  Yes Kimber Relic, MD  spironolactone (ALDACTONE) 25 MG tablet One daily to reduce fluid retention and to strenghthen the heart Patient taking differently: Take 25 mg by mouth daily. One daily to reduce fluid retention and to strenghthen the heart 07/15/15  Yes Kimber Relic, MD  triamcinolone (KENALOG) 0.025 % cream Apply 1 application topically 4 (four) times daily as needed (itching).   Yes Historical Provider, MD   Liver Function Tests  Recent Labs Lab 07/17/15 1631 07/18/15 1023  AST 18 25  ALT 7 11*  ALKPHOS 87 91  BILITOT 0.4 0.4  PROT 7.2 6.6   ALBUMIN 4.0 3.4*   No results for input(s): LIPASE, AMYLASE in the last 168 hours. CBC  Recent Labs Lab 07/17/15 1631 07/18/15 1023 07/19/15 0500 07/20/15 0439  WBC 5.4 6.7 6.2 6.1  NEUTROABS 2.9 5.1  --   --   HGB 8.7* 8.4* 8.9* 8.6*  HCT 29.1* 28.2* 29.3* 29.0*  MCV 77.8* 78.8 78.8 79.2  PLT 191 178 175 190   Basic Metabolic Panel  Recent Labs Lab 07/17/15 1631 07/18/15 1023 07/19/15 0500 07/20/15 0439  NA 134* 136 135 131*  K 5.6* 5.1 5.5* 5.6*  CL 97* 101 100* 98*  CO2 20*  GLUCOSE 112* 133* 145* 136*  BUN 29* 32* 40* 49*  CREATININE 1.75* 1.63* 2.44* 3.41*  CALCIUM 9.2 9.2 9.2 8.9    Filed Vitals:   07/20/15 0548 07/20/15 0800 07/20/15 0810 07/20/15 1305  BP: 84/50  103/75 132/109  Pulse: 86  91   Temp: 97.6 F (36.4 C)  98.4 F (36.9 C)   TempSrc: Oral  Oral   Resp: 18  16   Height:   (1.626 m)    Weight: 81.285 kg (179 lb 3.2 oz) 78.6 kg (173 lb 4.5 oz)    SpO2: 92%  98%    Exam Awake and alert, obese, WF in no distress No rash, cyanosis or gangrene Sclera anicteric, throat clear NO jvd, flat neck veins, no bruits Chest shows slight crackles L base, R clear Cor is irreg rhythm, no MRG Abd obese , large pannus, no abd wall edema, no ascites GU deferred LE's thin w/o edema , ulcers or wounds Neuro is alert and Ox 3, no asterixis   CXR vasc congestion on 10.7 ECHO w EF 35-40%.  Mild AS, mod MR, mod TR, severely dilated LA, PA pressure 48 mm Hg UA clear, 1.012, 100 prot, 7-10 wbc, no rbcs or cast   Assessment: 1. Acute on CRF - baseline creat 1.0- 1.3.  She was recently diagnosed with CHF, low EF and afib and was put on higher dose diuretics at home.  She  came in with hypotension and AKI which is getting worse. Has not received much in way of IVF or diuretics here.  On exam she has no sign off extra volume and is probably low on volume. Agree with holding all BP-lowering medications for now, let BP come up over 110 minimum.  Will  add IVF's at a moderate rate.  Urine Na/ Cr.  REnal US. No further w/u for now. Avoid acei/ ARB/ nsaid's and diuretics for now. Explained to pt and family and quetsions answered.  2. Syst CHF EF 30-35% 3. Hx HTN 30  Years 4. PPM 5. New afib 6. Hyperkalemia - cont renal diet for low K benefits, dc'd fluid restriction   Plan- as above  Vinson Moselle MD [facility] pager [phone]    cell [phone] 07/20/2015, 2:05 PM

## 2015-07-20 NOTE — Progress Notes (Signed)
Per Dr. Arta Silence Bladder scan done with resulting volume at 170 cc. BP left arm = 82/62  ; Right arm = 97/71 taken by dynamap.  Manual BP at 90/60.

## 2015-07-20 NOTE — Progress Notes (Signed)
DAILY PROGRESS NOTE  Subjective:  Creatinine has risen additionally overnight - now 3.42. K+ elevated at 5.6. GFR is quite low and she is sleepy today. H/H stable, GI is awaiting improvement before considering additional workup.  BP improved this morning.  Objective:  Temp:  [97.3 F (36.3 C)-98.6 F (37 C)] 98.4 F (36.9 C) (10/09 0810) Pulse Rate:  [71-91] 91 (10/09 0810) Resp:  [16-18] 16 (10/09 0810) BP: (84-132)/(47-109) 132/109 mmHg (10/09 1305) SpO2:  [92 %-98 %] 98 % (10/09 0810) Weight:  [173 lb 4.5 oz (78.6 kg)-179 lb 3.2 oz (81.285 kg)] 173 lb 4.5 oz (78.6 kg) (10/09 0800) Weight change: 8 lb 3.2 oz (3.719 kg)   Intake/Output from previous day: 10/08 0701 - 10/09 0700 In: 240 [P.O.:240] Out: -   Intake/Output from this shift: Total I/O In: 120 [P.O.:120] Out: -   Medications: Current Facility-Administered Medications  Medication Dose Route Frequency Provider Last Rate Last Dose  . 0.9 %  sodium chloride infusion  250 mL Intravenous PRN Dayna N Favor, PA-C      . 0.9 %  sodium chloride infusion   Intravenous Continuous Laurence Spates, MD 20 mL/hr at 07/18/15 2155    . acetaminophen (TYLENOL) tablet 650 mg  650 mg Oral Q4H PRN Dayna N Baranski, PA-C   650 mg at 07/19/15 1000  . albuterol (PROVENTIL) (2.5 MG/3ML) 0.083% nebulizer solution 2.5 mg  2.5 mg Nebulization Q6H PRN Lelon Perla, MD      . allopurinol (ZYLOPRIM) tablet 200 mg  200 mg Oral Daily Brittainy Erie Noe, PA-C   200 mg at 07/20/15 0941  . atorvastatin (LIPITOR) tablet 20 mg  20 mg Oral q1800 Dayna N Kunath, PA-C   20 mg at 07/19/15 1810  . DULoxetine (CYMBALTA) DR capsule 20 mg  20 mg Oral Daily Dayna N Creger, PA-C   20 mg at 07/20/15 0941  . furosemide (LASIX) tablet 20 mg  20 mg Oral Daily Dayna N Yan, PA-C   20 mg at 07/20/15 0941  . insulin aspart (novoLOG) injection 0-9 Units  0-9 Units Subcutaneous TID WC Dayna N Santillana, PA-C   2 Units at 07/20/15 1215  . montelukast (SINGULAIR) tablet 10 mg  10  mg Oral QHS Dayna N Hagos, PA-C   10 mg at 07/19/15 2209  . ondansetron (ZOFRAN) injection 4 mg  4 mg Intravenous Q6H PRN Dayna N Henard, PA-C      . pantoprazole (PROTONIX) EC tablet 40 mg  40 mg Oral BID Dayna N Gruwell, PA-C   40 mg at 07/20/15 0942  . sodium chloride 0.9 % injection 3 mL  3 mL Intravenous Q12H Dayna N Bober, PA-C   3 mL at 07/19/15 2207  . sodium chloride 0.9 % injection 3 mL  3 mL Intravenous PRN Charlie Pitter, PA-C        Physical Exam: General appearance: alert, no distress and seated upright in bed Neck: JVD - 4 cm above sternal notch and no carotid bruit Lungs: diminished breath sounds bibasilar Heart: irregularly irregular rhythm Abdomen: soft, non-tender; bowel sounds normal; no masses,  no organomegaly Extremities: extremities normal, atraumatic, no cyanosis or edema Pulses: 2+ and symmetric Skin: pale, cool, dry Neurologic: Grossly normal Psych: Pleasant  Lab Results: Results for orders placed or performed during the hospital encounter of 07/18/15 (from the past 48 hour(s))  Glucose, capillary     Status: Abnormal   Collection Time: 07/18/15  4:47 PM  Result Value Ref Range  Glucose-Capillary 132 (H) 65 - 99 mg/dL  Vitamin B12     Status: None   Collection Time: 07/18/15  6:00 PM  Result Value Ref Range   Vitamin B-12 375 180 - 914 pg/mL    Comment: (NOTE) This assay is not validated for testing neonatal or myeloproliferative syndrome specimens for Vitamin B12 levels.   Folate     Status: None   Collection Time: 07/18/15  6:00 PM  Result Value Ref Range   Folate 15.3 >5.9 ng/mL  Iron and TIBC     Status: Abnormal   Collection Time: 07/18/15  6:00 PM  Result Value Ref Range   Iron 13 (L) 28 - 170 ug/dL   TIBC 423 250 - 450 ug/dL   Saturation Ratios 3 (L) 10.4 - 31.8 %   UIBC 410 ug/dL  Ferritin     Status: Abnormal   Collection Time: 07/18/15  6:00 PM  Result Value Ref Range   Ferritin 5 (L) 11 - 307 ng/mL  Reticulocytes     Status: Abnormal    Collection Time: 07/18/15  6:00 PM  Result Value Ref Range   Retic Ct Pct 1.1 0.4 - 3.1 %   RBC. 3.50 (L) 3.87 - 5.11 MIL/uL   Retic Count, Manual 38.5 19.0 - 186.0 K/uL  Troponin I (q 6hr x 3)     Status: None   Collection Time: 07/18/15  6:00 PM  Result Value Ref Range   Troponin I 0.03 <0.031 ng/mL    Comment:        NO INDICATION OF MYOCARDIAL INJURY.   Glucose, capillary     Status: Abnormal   Collection Time: 07/18/15  9:50 PM  Result Value Ref Range   Glucose-Capillary 149 (H) 65 - 99 mg/dL  Troponin I (q 6hr x 3)     Status: Abnormal   Collection Time: 07/18/15 10:40 PM  Result Value Ref Range   Troponin I 0.04 (H) <0.031 ng/mL    Comment:        PERSISTENTLY INCREASED TROPONIN VALUES IN THE RANGE OF 0.04-0.49 ng/mL CAN BE SEEN IN:       -UNSTABLE ANGINA       -CONGESTIVE HEART FAILURE       -MYOCARDITIS       -CHEST TRAUMA       -ARRYHTHMIAS       -LATE PRESENTING MYOCARDIAL INFARCTION       -COPD   CLINICAL FOLLOW-UP RECOMMENDED.   Troponin I (q 6hr x 3)     Status: Abnormal   Collection Time: 07/19/15  5:00 AM  Result Value Ref Range   Troponin I 0.04 (H) <0.031 ng/mL    Comment:        PERSISTENTLY INCREASED TROPONIN VALUES IN THE RANGE OF 0.04-0.49 ng/mL CAN BE SEEN IN:       -UNSTABLE ANGINA       -CONGESTIVE HEART FAILURE       -MYOCARDITIS       -CHEST TRAUMA       -ARRYHTHMIAS       -LATE PRESENTING MYOCARDIAL INFARCTION       -COPD   CLINICAL FOLLOW-UP RECOMMENDED.   Basic metabolic panel     Status: Abnormal   Collection Time: 07/19/15  5:00 AM  Result Value Ref Range   Sodium 135 135 - 145 mmol/L   Potassium 5.5 (H) 3.5 - 5.1 mmol/L   Chloride 100 (L) 101 - 111 mmol/L   CO2 23 22 -  32 mmol/L   Glucose, Bld 145 (H) 65 - 99 mg/dL   BUN 40 (H) 6 - 20 mg/dL   Creatinine, Ser 2.44 (H) 0.44 - 1.00 mg/dL   Calcium 9.2 8.9 - 10.3 mg/dL   GFR calc non Af Amer 17 (L) >60 mL/min   GFR calc Af Amer 20 (L) >60 mL/min    Comment: (NOTE) The eGFR  has been calculated using the CKD EPI equation. This calculation has not been validated in all clinical situations. eGFR's persistently <60 mL/min signify possible Chronic Kidney Disease.    Anion gap 12 5 - 15  CBC     Status: Abnormal   Collection Time: 07/19/15  5:00 AM  Result Value Ref Range   WBC 6.2 4.0 - 10.5 K/uL   RBC 3.72 (L) 3.87 - 5.11 MIL/uL   Hemoglobin 8.9 (L) 12.0 - 15.0 g/dL   HCT 29.3 (L) 36.0 - 46.0 %   MCV 78.8 78.0 - 100.0 fL   MCH 23.9 (L) 26.0 - 34.0 pg   MCHC 30.4 30.0 - 36.0 g/dL   RDW 18.0 (H) 11.5 - 15.5 %   Platelets 175 150 - 400 K/uL    Comment: SPECIMEN CHECKED FOR CLOTS REPEATED TO VERIFY CONSISTENT WITH PREVIOUS RESULT   Ferritin     Status: Abnormal   Collection Time: 07/19/15  5:00 AM  Result Value Ref Range   Ferritin 9 (L) 11 - 307 ng/mL  Iron and TIBC     Status: Abnormal   Collection Time: 07/19/15  5:00 AM  Result Value Ref Range   Iron 18 (L) 28 - 170 ug/dL   TIBC 431 250 - 450 ug/dL   Saturation Ratios 4 (L) 10.4 - 31.8 %   UIBC 413 ug/dL  MRSA PCR Screening     Status: None   Collection Time: 07/19/15  5:31 AM  Result Value Ref Range   MRSA by PCR NEGATIVE NEGATIVE    Comment:        The GeneXpert MRSA Assay (FDA approved for NASAL specimens only), is one component of a comprehensive MRSA colonization surveillance program. It is not intended to diagnose MRSA infection nor to guide or monitor treatment for MRSA infections.   Glucose, capillary     Status: Abnormal   Collection Time: 07/19/15  7:38 AM  Result Value Ref Range   Glucose-Capillary 134 (H) 65 - 99 mg/dL  Glucose, capillary     Status: Abnormal   Collection Time: 07/19/15 11:31 AM  Result Value Ref Range   Glucose-Capillary 124 (H) 65 - 99 mg/dL  Glucose, capillary     Status: Abnormal   Collection Time: 07/19/15  4:39 PM  Result Value Ref Range   Glucose-Capillary 180 (H) 65 - 99 mg/dL  Glucose, capillary     Status: Abnormal   Collection Time:  07/19/15  8:49 PM  Result Value Ref Range   Glucose-Capillary 131 (H) 65 - 99 mg/dL  Basic metabolic panel     Status: Abnormal   Collection Time: 07/20/15  4:39 AM  Result Value Ref Range   Sodium 131 (L) 135 - 145 mmol/L   Potassium 5.6 (H) 3.5 - 5.1 mmol/L   Chloride 98 (L) 101 - 111 mmol/L   CO2 20 (L) 22 - 32 mmol/L   Glucose, Bld 136 (H) 65 - 99 mg/dL   BUN 49 (H) 6 - 20 mg/dL   Creatinine, Ser 3.41 (H) 0.44 - 1.00 mg/dL   Calcium 8.9 8.9 -  10.3 mg/dL   GFR calc non Af Amer 12 (L) >60 mL/min   GFR calc Af Amer 14 (L) >60 mL/min    Comment: (NOTE) The eGFR has been calculated using the CKD EPI equation. This calculation has not been validated in all clinical situations. eGFR's persistently <60 mL/min signify possible Chronic Kidney Disease.    Anion gap 13 5 - 15  CBC     Status: Abnormal   Collection Time: 07/20/15  4:39 AM  Result Value Ref Range   WBC 6.1 4.0 - 10.5 K/uL   RBC 3.66 (L) 3.87 - 5.11 MIL/uL   Hemoglobin 8.6 (L) 12.0 - 15.0 g/dL   HCT 29.0 (L) 36.0 - 46.0 %   MCV 79.2 78.0 - 100.0 fL   MCH 23.5 (L) 26.0 - 34.0 pg   MCHC 29.7 (L) 30.0 - 36.0 g/dL   RDW 18.3 (H) 11.5 - 15.5 %   Platelets 190 150 - 400 K/uL  Glucose, capillary     Status: Abnormal   Collection Time: 07/20/15  8:05 AM  Result Value Ref Range   Glucose-Capillary 130 (H) 65 - 99 mg/dL   Comment 1 Notify RN     Imaging: No results found.  Assessment:  Active Problems:   Mild CAD - 20-30% RCA in 2013   Mild aortic stenosis   Essential hypertension   Symptomatic anemia   Atrial fibrillation - developed 07/11/15 by PPM interrogation   Moderate mitral regurgitation   Moderate tricuspid regurgitation   Chronic systolic CHF (congestive heart failure) (HCC)   History of pacemaker - Medtronic   Microcytic anemia   Acute renal failure superimposed on stage 3 chronic kidney disease (HCC)   Diabetes mellitus with nephropathy ([place])   Plan: 1. Symptomatic anemia - appreciate GI  evaluation, plan EGD once BP and creatinine improved.  2. Recently diagnosed acute systolic CHF EF 07-86% - off of ACE-I and aldactone. Hold metoprolol due to hypotension. D/c lasix in the setting of rising creatinine, although still appears volume overloaded.  3. Recently diagnosed atrial fib (in this since 07/11/15) - Eliquis on hold due to anemia. Cannot pursue cardioversion safely unless she can be anticoagulated.  4. AKI on CKD stage III - creatinine continues to climb. Hold lasix. Will ask for nephrology consult today.  5. Essential HTN with history of orthostasis, now with hypotension - BP improved today, holding meds.  6. Valvular heart disease with mod MR, mod TR, mild AS - follow clinically.  7. Small pericardial effusion - follow clinically. She has not had classic signs of pericarditis.  8. DM with nephro complications - hold metformin due to renal insufficiency. Continue onglyza. Add SSI.  9. Hyperkalemia - elevated at 5.6. Hold exogenous potassium, on renal diet. Kayexelate x 1 today.  Time Spent Directly with Patient:  30 minutes  Length of Stay:  LOS: 2 days   Danielle Casino, MD, [facility] Attending Cardiologist [place] 07/20/2015, 1:17 PM

## 2015-07-21 ENCOUNTER — Inpatient Hospital Stay (HOSPITAL_COMMUNITY): Payer: Medicare Other

## 2015-07-21 DIAGNOSIS — I340 Nonrheumatic mitral (valve) insufficiency: Secondary | ICD-10-CM

## 2015-07-21 LAB — CBC
HEMATOCRIT: 27.5 % — AB (ref 36.0–46.0)
HEMOGLOBIN: 8.1 g/dL — AB (ref 12.0–15.0)
MCH: 23.2 pg — ABNORMAL LOW (ref 26.0–34.0)
MCHC: 29.5 g/dL — ABNORMAL LOW (ref 30.0–36.0)
MCV: 78.8 fL (ref 78.0–100.0)
Platelets: 179 10*3/uL (ref 150–400)
RBC: 3.49 MIL/uL — ABNORMAL LOW (ref 3.87–5.11)
RDW: 18.1 % — AB (ref 11.5–15.5)
WBC: 5.9 10*3/uL (ref 4.0–10.5)

## 2015-07-21 LAB — GLUCOSE, CAPILLARY
Glucose-Capillary: 174 mg/dL — ABNORMAL HIGH (ref 65–99)
Glucose-Capillary: 230 mg/dL — ABNORMAL HIGH (ref 65–99)
Glucose-Capillary: 170 mg/dL — ABNORMAL HIGH (ref 65–99)
Glucose-Capillary: 149 mg/dL — ABNORMAL HIGH (ref 65–99)

## 2015-07-21 LAB — BASIC METABOLIC PANEL
ANION GAP: 11 (ref 5–15)
BUN: 51 mg/dL — AB (ref 6–20)
CHLORIDE: 101 mmol/L (ref 101–111)
CO2: 22 mmol/L (ref 22–32)
Calcium: 8.6 mg/dL — ABNORMAL LOW (ref 8.9–10.3)
Creatinine, Ser: 2.94 mg/dL — ABNORMAL HIGH (ref 0.44–1.00)
GFR calc Af Amer: 16 mL/min — ABNORMAL LOW (ref >60)
GFR calc non Af Amer: 14 mL/min — ABNORMAL LOW (ref >60)
Glucose, Bld: 164 mg/dL — ABNORMAL HIGH (ref 65–99)
POTASSIUM: 4.5 mmol/L (ref 3.5–5.1)
SODIUM: 134 mmol/L — AB (ref 135–145)

## 2015-07-21 MED ORDER — SENNA 8.6 MG PO TABS
1.0000 | ORAL_TABLET | Freq: Once | ORAL | Status: AC
  Administered 2015-07-21: 8.6 mg via ORAL
  Filled 2015-07-21: qty 1

## 2015-07-21 MED ORDER — METOPROLOL TARTRATE 12.5 MG HALF TABLET
12.5000 mg | ORAL_TABLET | Freq: Two times a day (BID) | ORAL | Status: DC
  Administered 2015-07-21 – 2015-07-22 (×3): 12.5 mg via ORAL
  Filled 2015-07-21 (×3): qty 1

## 2015-07-21 NOTE — Progress Notes (Signed)
Subjective:  Feeling weak and still has DOE  Objective:  Temp:  [97.9 F (36.6 C)-98 F (36.7 C)] 97.9 F (36.6 C) (10/10 0900) Pulse Rate:  [64-118] 101 (10/10 0900) BP: (82-113)/(60-83) 113/83 mmHg (10/10 0900) SpO2:  [94 %-100 %] 100 % (10/10 0900) Weight:  [172 lb 9.6 oz (78.291 kg)] 172 lb 9.6 oz (78.291 kg) (10/10 0400) Weight change: -5 lb 14.7 oz (-2.685 kg)  Intake/Output from previous day: 10/09 0701 - 10/10 0700 In: 740 [P.O.:740] Out: 550 [Urine:550]  Intake/Output from this shift: Total I/O In: 740 [P.O.:480; I.V.:260] Out: -   Physical Exam: General appearance: alert and no distress Neck: no adenopathy, no carotid bruit, no JVD, supple, symmetrical, trachea midline and thyroid not enlarged, symmetric, no tenderness/mass/nodules Lungs: clear to auscultation bilaterally Heart: irregularly irregular rhythm Extremities: extremities normal, atraumatic, no cyanosis or edema  Lab Results: Results for orders placed or performed during the hospital encounter of 07/18/15 (from the past 48 hour(s))  Glucose, capillary     Status: Abnormal   Collection Time: 07/19/15  4:39 PM  Result Value Ref Range   Glucose-Capillary 180 (H) 65 - 99 mg/dL  Glucose, capillary     Status: Abnormal   Collection Time: 07/19/15  8:49 PM  Result Value Ref Range   Glucose-Capillary 131 (H) 65 - 99 mg/dL  Basic metabolic panel     Status: Abnormal   Collection Time: 07/20/15  4:39 AM  Result Value Ref Range   Sodium 131 (L) 135 - 145 mmol/L   Potassium 5.6 (H) 3.5 - 5.1 mmol/L   Chloride 98 (L) 101 - 111 mmol/L   CO2 20 (L) 22 - 32 mmol/L   Glucose, Bld 136 (H) 65 - 99 mg/dL   BUN 49 (H) 6 - 20 mg/dL   Creatinine, Ser 3.41 (H) 0.44 - 1.00 mg/dL   Calcium 8.9 8.9 - 10.3 mg/dL   GFR calc non Af Amer 12 (L) >60 mL/min   GFR calc Af Amer 14 (L) >60 mL/min    Comment: (NOTE) The eGFR has been calculated using the CKD EPI equation. This calculation has not been validated in all  clinical situations. eGFR's persistently <60 mL/min signify possible Chronic Kidney Disease.    Anion gap 13 5 - 15  CBC     Status: Abnormal   Collection Time: 07/20/15  4:39 AM  Result Value Ref Range   WBC 6.1 4.0 - 10.5 K/uL   RBC 3.66 (L) 3.87 - 5.11 MIL/uL   Hemoglobin 8.6 (L) 12.0 - 15.0 g/dL   HCT 29.0 (L) 36.0 - 46.0 %   MCV 79.2 78.0 - 100.0 fL   MCH 23.5 (L) 26.0 - 34.0 pg   MCHC 29.7 (L) 30.0 - 36.0 g/dL   RDW 18.3 (H) 11.5 - 15.5 %   Platelets 190 150 - 400 K/uL  Glucose, capillary     Status: Abnormal   Collection Time: 07/20/15  8:05 AM  Result Value Ref Range   Glucose-Capillary 130 (H) 65 - 99 mg/dL   Comment 1 Notify RN   Glucose, capillary     Status: Abnormal   Collection Time: 07/20/15 11:58 AM  Result Value Ref Range   Glucose-Capillary 176 (H) 65 - 99 mg/dL   Comment 1 Notify RN   Sodium, urine, random     Status: None   Collection Time: 07/20/15  4:00 PM  Result Value Ref Range   Sodium, Ur 41 mmol/L  Creatinine, urine, random  Status: None   Collection Time: 07/20/15  4:00 PM  Result Value Ref Range   Creatinine, Urine 148.42 mg/dL  Occult blood card to lab, stool RN will collect     Status: None   Collection Time: 07/20/15  5:18 PM  Result Value Ref Range   Fecal Occult Bld NEGATIVE NEGATIVE  Glucose, capillary     Status: Abnormal   Collection Time: 07/20/15  5:33 PM  Result Value Ref Range   Glucose-Capillary 158 (H) 65 - 99 mg/dL   Comment 1 Notify RN   Glucose, capillary     Status: Abnormal   Collection Time: 07/20/15  8:44 PM  Result Value Ref Range   Glucose-Capillary 155 (H) 65 - 99 mg/dL  Basic metabolic panel     Status: Abnormal   Collection Time: 07/21/15  3:26 AM  Result Value Ref Range   Sodium 134 (L) 135 - 145 mmol/L   Potassium 4.5 3.5 - 5.1 mmol/L    Comment: DELTA CHECK NOTED   Chloride 101 101 - 111 mmol/L   CO2 22 22 - 32 mmol/L   Glucose, Bld 164 (H) 65 - 99 mg/dL   BUN 51 (H) 6 - 20 mg/dL   Creatinine, Ser  2.94 (H) 0.44 - 1.00 mg/dL   Calcium 8.6 (L) 8.9 - 10.3 mg/dL   GFR calc non Af Amer 14 (L) >60 mL/min   GFR calc Af Amer 16 (L) >60 mL/min    Comment: (NOTE) The eGFR has been calculated using the CKD EPI equation. This calculation has not been validated in all clinical situations. eGFR's persistently <60 mL/min signify possible Chronic Kidney Disease.    Anion gap 11 5 - 15  CBC     Status: Abnormal   Collection Time: 07/21/15  3:26 AM  Result Value Ref Range   WBC 5.9 4.0 - 10.5 K/uL   RBC 3.49 (L) 3.87 - 5.11 MIL/uL   Hemoglobin 8.1 (L) 12.0 - 15.0 g/dL   HCT 27.5 (L) 36.0 - 46.0 %   MCV 78.8 78.0 - 100.0 fL   MCH 23.2 (L) 26.0 - 34.0 pg   MCHC 29.5 (L) 30.0 - 36.0 g/dL   RDW 18.1 (H) 11.5 - 15.5 %   Platelets 179 150 - 400 K/uL  Glucose, capillary     Status: Abnormal   Collection Time: 07/21/15  7:41 AM  Result Value Ref Range   Glucose-Capillary 174 (H) 65 - 99 mg/dL  Glucose, capillary     Status: Abnormal   Collection Time: 07/21/15 12:00 PM  Result Value Ref Range   Glucose-Capillary 230 (H) 65 - 99 mg/dL    Imaging: Imaging results have been reviewed Tele- Afib with VR 110-130 Assessment/Plan:   1. Active Problems: 2.   Mild CAD - 20-30% RCA in 2013 3.   Mild aortic stenosis 4.   Essential hypertension 5.   Symptomatic anemia 6.   Atrial fibrillation - developed 07/11/15 by PPM interrogation 7.   Moderate mitral regurgitation 8.   Moderate tricuspid regurgitation 9.   Chronic systolic CHF (congestive heart failure) ([place]) 10.   History of pacemaker - Medtronic 11.   Microcytic anemia 12.   Acute renal failure superimposed on stage 3 chronic kidney disease ([place]) 13.   Diabetes mellitus with nephropathy ([place]) 14.   Time Spent Directly with Patient:  20 minutes  Length of Stay:  LOS: 3 days   Pt admitted 10/7 with CHF, declining HGB and rising Scr as a result  of OP diuresis. She has had a PTVPM inserted remotely. Her EF 2 years ago was nl, now 35-40%.  Her BNP was elevated . SCr peaked at 3.4 and has declined to 3.94 with holding diuretics and gentle hydration. I/O + 1 liter. I think that the issue is her AFIB however can't consider DCCV without anticoag and with declining HGB makes IV hep and /or OAC problematic. Will add low dose BB for HR. Follow Scr. GI to re eval for bleeding source and ability to start anticoagulation. Apprec Renal Service input.  Quay Burow 07/21/2015, 1:37 PM

## 2015-07-21 NOTE — Care Management Important Message (Signed)
Important Message  Patient Details  Name: Danielle Benitez MRN: [id number] Date of Birth: [birth year]   Medicare Important Message Given:  Yes-second notification given    Kyla Balzarine 07/21/2015, 11:44 AM

## 2015-07-21 NOTE — Progress Notes (Signed)
Pt up in the chair this afternoon. Pt a little unsteady on the feet. Will check with MD for PT/OT order

## 2015-07-21 NOTE — Progress Notes (Signed)
Patient ID: Danielle Benitez, female   DOB: 1932-05-26, 81 y.o.   MRN: [id number] S:feeling better today and wants to go hoe O:BP 113/83 mmHg  Pulse 101  Temp(Src) 97.9 F (36.6 C) (Oral)  Resp 20  Ht 5\' 4"  (1.626 m)  Wt 78.291 kg (172 lb 9.6 oz)  BMI 29.61 kg/m2  SpO2 100%  Intake/Output Summary (Last 24 hours) at 07/21/15 1117 Last data filed at 07/21/15 0549  Gross per 24 hour  Intake    380 ml  Output    550 ml  Net   -170 ml   Intake/Output: I/O last 3 completed shifts: In: 740 [P.O.:740] Out: 550 [Urine:550]  Intake/Output this shift:    Weight change: -2.685 kg (-5 lb 14.7 oz) QDI:YMEBR, elderly WF in NAD CVS:irr irr and tachycardic Resp:cta AXE:NMMHWK Ext:no edema   Recent Labs Lab 07/17/15 1631 07/18/15 1023 07/19/15 0500 07/20/15 0439 07/21/15 0326  NA 134* 136 135 131* 134*  K 5.6* 5.1 5.5* 5.6* 4.5  CL 97* 101 100* 98* 101  CO2 26 25 23  20* 22  GLUCOSE 112* 133* 145* 136* 164*  BUN 29* 32* 40* 49* 51*  CREATININE 1.75* 1.63* 2.44* 3.41* 2.94*  ALBUMIN 4.0 3.4*  --   --   --   CALCIUM 9.2 9.2 9.2 8.9 8.6*  AST 18 25  --   --   --   ALT 7 11*  --   --   --    Liver Function Tests:  Recent Labs Lab 07/17/15 1631 07/18/15 1023  AST 18 25  ALT 7 11*  ALKPHOS 87 91  BILITOT 0.4 0.4  PROT 7.2 6.6  ALBUMIN 4.0 3.4*   No results for input(s): LIPASE, AMYLASE in the last 168 hours. No results for input(s): AMMONIA in the last 168 hours. CBC:  Recent Labs Lab 07/17/15 1631 07/18/15 1023 07/19/15 0500 07/20/15 0439 07/21/15 0326  WBC 5.4 6.7 6.2 6.1 5.9  NEUTROABS 2.9 5.1  --   --   --   HGB 8.7* 8.4* 8.9* 8.6* 8.1*  HCT 29.1* 28.2* 29.3* 29.0* 27.5*  MCV 77.8* 78.8 78.8 79.2 78.8  PLT 191 178 175 190 179   Cardiac Enzymes:  Recent Labs Lab 07/18/15 1800 07/18/15 2240 07/19/15 0500  TROPONINI 0.03 0.04* 0.04*   CBG:  Recent Labs Lab 07/20/15 0805 07/20/15 1158 07/20/15 1733 07/20/15 2044 07/21/15 0741  GLUCAP 130* 176* 158*  155* 174*    Iron Studies:  Recent Labs  07/19/15 0500  IRON 18*  TIBC 431  FERRITIN 9*   Studies/Results: US Renal  07/21/2015   CLINICAL DATA:  Acute renal failure  EXAM: RENAL / URINARY TRACT ULTRASOUND COMPLETE  COMPARISON:  None.  FINDINGS: Right Kidney:  Length: 10.3 cm. Echogenicity within normal limits. No mass or hydronephrosis visualized.  Left Kidney:  Length: 10.6 cm. Mildly increased echotexture. No mass or hydronephrosis.  Bladder:  Appears normal for degree of bladder distention.  IMPRESSION: No hydronephrosis or acute findings. Mildly increased echotexture on the left.   Electronically Signed   By: Charlett Nose M.D.   On: 07/21/2015 11:11   . allopurinol  200 mg Oral Daily  . atorvastatin  20 mg Oral q1800  . DULoxetine  20 mg Oral Daily  . insulin aspart  0-9 Units Subcutaneous TID WC  . montelukast  10 mg Oral QHS  . pantoprazole  40 mg Oral BID  . sodium chloride  3 mL Intravenous Q12H  BMET    Component Value Date/Time   NA 134* 07/21/2015 0326   NA 140 04/28/2015 0857   K 4.5 07/21/2015 0326   CL 101 07/21/2015 0326   CO2 22 07/21/2015 0326   GLUCOSE 164* 07/21/2015 0326   GLUCOSE 101* 04/28/2015 0857   BUN 51* 07/21/2015 0326   BUN 18 04/28/2015 0857   CREATININE 2.94* 07/21/2015 0326   CREATININE 1.75* 07/17/2015 1631   CALCIUM 8.6* 07/21/2015 0326   GFRNONAA 14* 07/21/2015 0326   GFRAA 16* 07/21/2015 0326   CBC    Component Value Date/Time   WBC 5.9 07/21/2015 0326   WBC 6.0 11/16/2013 0912   RBC 3.49* 07/21/2015 0326   RBC 3.50* 07/18/2015 1800   RBC 3.85 11/16/2013 0912   HGB 8.1* 07/21/2015 0326   HCT 27.5* 07/21/2015 0326   PLT 179 07/21/2015 0326   MCV 78.8 07/21/2015 0326   MCH 23.2* 07/21/2015 0326   MCH 29.4 11/16/2013 0912   MCHC 29.5* 07/21/2015 0326   MCHC 32.4 11/16/2013 0912   RDW 18.1* 07/21/2015 0326   RDW 16.1* 11/16/2013 0912   LYMPHSABS 1.0 07/18/2015 1023   LYMPHSABS 2.4 11/16/2013 0912   MONOABS 0.4  07/18/2015 1023   EOSABS 0.1 07/18/2015 1023   EOSABS 0.2 11/16/2013 0912   BASOSABS 0.0 07/18/2015 1023   BASOSABS 0.0 11/16/2013 0912    Assessment/Plan:  1. AKI/CKD stage 3 (Scr 1-1.3) in setting of decompensated CHF, low EF and escalating diuretic use.  Presumably due to intravascular volume depletion/ATN.  Scr improving after gentle hydration.  Cont to follow and avoid nephrotoxic agents such as NSAIDs/Cox-II I's, hold ACE/ARB/diuretics for now. 1. Stop IVF's and follow off of diuretics 2. Acute on chronic systolic CHF- appears euvolemic on exam 3. HTN- stable 4. New A fib- with RVR.  Plan per cardiology 5. Hyperkalemia- due to #1.  Resolved 6. Anemia- appears acute and work up underway but on hold due to new onset A fib and AKI. 7. Valvular heart disease- per Cardiology 8. DM- metformin on hold due to AKI.  Plan per primary svc.  Chloie Loney A

## 2015-07-21 NOTE — Progress Notes (Signed)
Pt and family requests to have [facility] consult

## 2015-07-21 NOTE — Progress Notes (Signed)
Events noted.  In my opinion, endoscopy in this medically ill patient would be somewhat risky and is of dubious benefit, given her heme-negative stool on admission and alternative causes for her anemia (which is not that much different than it was a few years ago).  I will plan to sign off at this time. However, if endoscopic evaluation is felt to be of critical importance prior to initiation of anticoagulation, feel free to contact me, and I would be happy to arrange it.  Florencia Reasons, M.D. Pager [phone] If no answer or after 5 PM call [phone]

## 2015-07-21 NOTE — Progress Notes (Signed)
Utilization review completed. Bellarae Lizer, RN, BSN. 

## 2015-07-22 DIAGNOSIS — I2510 Atherosclerotic heart disease of native coronary artery without angina pectoris: Secondary | ICD-10-CM

## 2015-07-22 LAB — BASIC METABOLIC PANEL
Anion gap: 10 (ref 5–15)
BUN: 45 mg/dL — AB (ref 6–20)
CO2: 23 mmol/L (ref 22–32)
CREATININE: 2.25 mg/dL — AB (ref 0.44–1.00)
Calcium: 9.1 mg/dL (ref 8.9–10.3)
Chloride: 102 mmol/L (ref 101–111)
GFR, EST AFRICAN AMERICAN: 22 mL/min — AB (ref >60)
GFR, EST NON AFRICAN AMERICAN: 19 mL/min — AB (ref >60)
Glucose, Bld: 142 mg/dL — ABNORMAL HIGH (ref 65–99)
Potassium: 4 mmol/L (ref 3.5–5.1)
SODIUM: 135 mmol/L (ref 135–145)

## 2015-07-22 LAB — GLUCOSE, CAPILLARY
GLUCOSE-CAPILLARY: 144 mg/dL — AB (ref 65–99)
GLUCOSE-CAPILLARY: 190 mg/dL — AB (ref 65–99)
Glucose-Capillary: 221 mg/dL — ABNORMAL HIGH (ref 65–99)

## 2015-07-22 LAB — CBC
HCT: 27.1 % — ABNORMAL LOW (ref 36.0–46.0)
Hemoglobin: 8.2 g/dL — ABNORMAL LOW (ref 12.0–15.0)
MCH: 23.5 pg — ABNORMAL LOW (ref 26.0–34.0)
MCHC: 30.3 g/dL (ref 30.0–36.0)
MCV: 77.7 fL — ABNORMAL LOW (ref 78.0–100.0)
PLATELETS: 165 10*3/uL (ref 150–400)
RBC: 3.49 MIL/uL — AB (ref 3.87–5.11)
RDW: 18 % — AB (ref 11.5–15.5)
WBC: 6.8 10*3/uL (ref 4.0–10.5)

## 2015-07-22 LAB — T4, FREE

## 2015-07-22 LAB — TSH: TSH: 1.26 u[IU]/mL (ref 0.350–4.500)

## 2015-07-22 LAB — BRAIN NATRIURETIC PEPTIDE: B NATRIURETIC PEPTIDE 5: 1485.8 pg/mL — AB (ref 0.0–100.0)

## 2015-07-22 MED ORDER — METOPROLOL TARTRATE 25 MG PO TABS
25.0000 mg | ORAL_TABLET | Freq: Two times a day (BID) | ORAL | Status: DC
  Administered 2015-07-22 – 2015-07-28 (×12): 25 mg via ORAL
  Filled 2015-07-22 (×12): qty 1

## 2015-07-22 MED ORDER — SODIUM CHLORIDE 0.9 % IV SOLN
510.0000 mg | Freq: Once | INTRAVENOUS | Status: AC
  Administered 2015-07-22: 510 mg via INTRAVENOUS
  Filled 2015-07-22: qty 17

## 2015-07-22 NOTE — Evaluation (Signed)
Physical Therapy Evaluation Patient Details Name: Danielle Benitez MRN: [id number] DOB: Oct 21, 1931 Today's Date: 07/22/2015   History of Present Illness  Danielle Benitez is a 81 y.o. female with history of HTN, orthostatic hypotension, DM, mild aortic stenosis, CKD stage III, symptomatic bradycardia s/p Medtronic PPM, RBBB/LAFB, dyslipidemia, nonobstructive CAD in 2013 (20-30% prox RCA), and diagnosis of LV dysfunction/afib who presented to the ER for worsening hemoglobin level.   Clinical Impression  Pt admitted with above diagnosis. Pt currently with functional limitations due to the deficits listed below (see PT Problem List). Danielle Benitez currently requires min assist ambulating w/ RW as she demonstrates forward flexion, putting her at increased risk for falls.  She was limited by SOB and is quick to fatigue w/ ambulation.  She will have assist prn during the day as her daughter works only 5 mins from pt's home.  Pt will benefit from skilled PT to increase their independence and safety with mobility to allow discharge to the venue listed below.      Follow Up Recommendations Home health PT;Supervision - Intermittent (Daughter only 5 min drive away when at work)    Equipment Recommendations  None recommended by PT    Recommendations for Other Services OT consult     Precautions / Restrictions Precautions Precautions: Fall Restrictions Weight Bearing Restrictions: No      Mobility  Bed Mobility Overal bed mobility: Modified Independent             General bed mobility comments: Use of bed rail and increased time to sit up on EOB.  No physical assist or cues needed.  Transfers Overall transfer level: Needs assistance Equipment used: Rolling walker (2 wheeled) Transfers: Sit to/from Stand Sit to Stand: Min guard         General transfer comment: Cues to push up from bed rather than pulling on RW.  Pt slow to rise 2/2 feeling "stiff".    Ambulation/Gait Ambulation/Gait assistance:  Min assist;+2 safety/equipment Ambulation Distance (Feet): 70 Feet Assistive device: Rolling walker (2 wheeled) Gait Pattern/deviations: Step-through pattern;Decreased stride length;Antalgic;Trunk flexed   Gait velocity interpretation: <1.8 ft/sec, indicative of risk for recurrent falls General Gait Details: Pt continues to push RW too far ahead of her, demonstrating trunk flexion, despite verbal cues.  She corrects her posture but returns to trunk flexion quickly.  Min assist at times for positioning of RW.  Pt requires several standing rest breaks 2/2 fatigue and SOB.    Stairs            Wheelchair Mobility    Modified Rankin (Stroke Patients Only)       Balance Overall balance assessment: Needs assistance Sitting-balance support: Bilateral upper extremity supported;Feet supported Sitting balance-Leahy Scale: Good     Standing balance support: Bilateral upper extremity supported;During functional activity Standing balance-Leahy Scale: Poor                               Pertinent Vitals/Pain Pain Assessment: Faces Faces Pain Scale: Hurts little more Pain Location: Rt knee Pain Descriptors / Indicators: Aching Pain Intervention(s): Limited activity within patient's tolerance;Monitored during session;Repositioned    Home Living Family/patient expects to be discharged to:: Private residence Living Arrangements: Children;Other relatives (daughter, son in law) Available Help at Discharge: Family;Available PRN/intermittently (neighbors, daughter works during the day only 5 mins away) Type of Home: Apartment Home Access: Level entry     Home Layout: One level Home Equipment: Environmental consultant -  2 wheels;Cane - quad;Cane - single point      Prior Function Level of Independence: Independent with assistive device(s)         Comments: PTA pt using cane most of the time. She reports that she recognizes that she needs to start using her RW as she has noticed she is  unsteady.     Hand Dominance        Extremity/Trunk Assessment   Upper Extremity Assessment: Generalized weakness           Lower Extremity Assessment: RLE deficits/detail;LLE deficits/detail RLE Deficits / Details: Pt c/o Rt knee pain and reports that she has h/o gout which effects her day to day.  Grossly strength WFL. LLE Deficits / Details: Grossly strength WFL.  5/5 throughout.     Communication   Communication: No difficulties  Cognition Arousal/Alertness: Awake/alert Behavior During Therapy: WFL for tasks assessed/performed Overall Cognitive Status: Within Functional Limits for tasks assessed                      General Comments      Exercises        Assessment/Plan    PT Assessment Patient needs continued PT services  PT Diagnosis Difficulty walking;Abnormality of gait;Acute pain   PT Problem List Decreased strength;Decreased range of motion;Decreased activity tolerance;Decreased balance;Decreased mobility;Decreased knowledge of use of DME;Decreased safety awareness;Decreased knowledge of precautions  PT Treatment Interventions DME instruction;Gait training;Stair training;Functional mobility training;Therapeutic activities;Therapeutic exercise;Balance training;Neuromuscular re-education;Patient/family education   PT Goals (Current goals can be found in the Care Plan section) Acute Rehab PT Goals Patient Stated Goal: to get stronger so she can go back to spending time w/ her grandson PT Goal Formulation: With patient Time For Goal Achievement: 08/05/15 Potential to Achieve Goals: Good    Frequency Min 3X/week   Barriers to discharge Decreased caregiver support Pt will be alone during the day when her daughter is at work and grandson at school; however, pt's daughter works only 5 mins away and has ability to come home prn.  Additionally, neighbors who told pt they would be willing to spend time w/ her prn.    Co-evaluation                End of Session Equipment Utilized During Treatment: Gait belt Activity Tolerance: Patient limited by fatigue Patient left: in bed;with call bell/phone within reach Nurse Communication: Mobility status;Precautions;Other (comment) ([name] forward flexion w/ use of RW)         Time: 8413-2440 PT Time Calculation (min) (ACUTE ONLY): 30 min   Charges:   PT Evaluation $Initial PT Evaluation Tier I: 1 Procedure PT Treatments $Gait Training: 8-22 mins   PT G CodesMichail Jewels PT, DPT [phone] Pager: [phone] 07/22/2015, 1:03 PM

## 2015-07-22 NOTE — Progress Notes (Signed)
Pt IV infiltrated. New IV placed, Iron infusing. Foley Cath removed per Order. Pt due to void. Cecille Rubin, RN

## 2015-07-22 NOTE — Progress Notes (Signed)
Patient ID: Danielle Benitez, female   DOB: 1931/12/22, 81 y.o.   MRN: [id number] S:feeling better O:BP 97/63 mmHg  Pulse 102  Temp(Src) 98.3 F (36.8 C) (Oral)  Resp 18  Ht  (1.626 m)  Wt 78.245 kg (172 lb 8 oz)  BMI 29.59 kg/m2  SpO2 98%  Intake/Output Summary (Last 24 hours) at 07/22/15 0859 Last data filed at 07/22/15 0806  Gross per 24 hour  Intake   1020 ml  Output   1075 ml  Net    -55 ml   Intake/Output: I/O last 3 completed shifts: In: 1080 [P.O.:820; I.V.:260] Out: 1625 [Urine:1625]  Intake/Output this shift:  Total I/O In: 240 [P.O.:240] Out: -  Weight change: -0.354 kg (-12.5 oz) Gen:WD elderly WF in NAD CVS:IRR IRR and tachycardic Resp:cta JXB:JYNWGN Ext:no edema   Recent Labs Lab 07/17/15 1631 07/18/15 1023 07/19/15 0500 07/20/15 0439 07/21/15 0326 07/22/15 0438  NA 134* 136 135 131* 134* 135  K 5.6* 5.1 5.5* 5.6* 4.5 4.0  CL 97* 101 100* 98* 101 102  CO2 20* 22 23  GLUCOSE 112* 133* 145* 136* 164* 142*  BUN 29* 32* 40* 49* 51* 45*  CREATININE 1.75* 1.63* 2.44* 3.41* 2.94* 2.25*  ALBUMIN 4.0 3.4*  --   --   --   --   CALCIUM 9.2 9.2 9.2 8.9 8.6* 9.1  AST 18 25  --   --   --   --   ALT 7 11*  --   --   --   --    Liver Function Tests:  Recent Labs Lab 07/17/15 1631 07/18/15 1023  AST 18 25  ALT 7 11*  ALKPHOS 87 91  BILITOT 0.4 0.4  PROT 7.2 6.6  ALBUMIN 4.0 3.4*   No results for input(s): LIPASE, AMYLASE in the last 168 hours. No results for input(s): AMMONIA in the last 168 hours. CBC:  Recent Labs Lab 07/17/15 1631 07/18/15 1023 07/19/15 0500 07/20/15 0439 07/21/15 0326 07/22/15 0438  WBC 5.4 6.7 6.2 6.1 5.9 6.8  NEUTROABS 2.9 5.1  --   --   --   --   HGB 8.7* 8.4* 8.9* 8.6* 8.1* 8.2*  HCT 29.1* 28.2* 29.3* 29.0* 27.5* 27.1*  MCV 77.8* 78.8 78.8 79.2 78.8 77.7*  PLT 191 178 175 190 179 165   Cardiac Enzymes:  Recent Labs Lab 07/18/15 1800 07/18/15 2240 07/19/15 0500  TROPONINI 0.03 0.04* 0.04*    CBG:  Recent Labs Lab 07/21/15 0741 07/21/15 1200 07/21/15 1646 07/21/15 2106 07/22/15 0733  GLUCAP 174* 230* 170* 149* 144*    Iron Studies: No results for input(s): IRON, TIBC, TRANSFERRIN, FERRITIN in the last 72 hours. Studies/Results: US Renal  07/21/2015   CLINICAL DATA:  Acute renal failure  EXAM: RENAL / URINARY TRACT ULTRASOUND COMPLETE  COMPARISON:  None.  FINDINGS: Right Kidney:  Length: 10.3 cm. Echogenicity within normal limits. No mass or hydronephrosis visualized.  Left Kidney:  Length: 10.6 cm. Mildly increased echotexture. No mass or hydronephrosis.  Bladder:  Appears normal for degree of bladder distention.  IMPRESSION: No hydronephrosis or acute findings. Mildly increased echotexture on the left.   Electronically Signed   By: Charlett Nose M.D.   On: 07/21/2015 11:11   . allopurinol  200 mg Oral Daily  . atorvastatin  20 mg Oral q1800  . DULoxetine  20 mg Oral Daily  . insulin aspart  0-9 Units Subcutaneous TID WC  . metoprolol tartrate  12.5 mg Oral BID  . montelukast  10 mg Oral QHS  . pantoprazole  40 mg Oral BID  . sodium chloride  3 mL Intravenous Q12H    BMET    Component Value Date/Time   NA 135 07/22/2015 0438   NA 140 04/28/2015 0857   K 4.0 07/22/2015 0438   CL 102 07/22/2015 0438   CO2 23 07/22/2015 0438   GLUCOSE 142* 07/22/2015 0438   GLUCOSE 101* 04/28/2015 0857   BUN 45* 07/22/2015 0438   BUN 18 04/28/2015 0857   CREATININE 2.25* 07/22/2015 0438   CREATININE 1.75* 07/17/2015 1631   CALCIUM 9.1 07/22/2015 0438   GFRNONAA 19* 07/22/2015 0438   GFRAA 22* 07/22/2015 0438   CBC    Component Value Date/Time   WBC 6.8 07/22/2015 0438   WBC 6.0 11/16/2013 0912   RBC 3.49* 07/22/2015 0438   RBC 3.50* 07/18/2015 1800   RBC 3.85 11/16/2013 0912   HGB 8.2* 07/22/2015 0438   HCT 27.1* 07/22/2015 0438   PLT 165 07/22/2015 0438   MCV 77.7* 07/22/2015 0438   MCH 23.5* 07/22/2015 0438   MCH 29.4 11/16/2013 0912   MCHC 30.3 07/22/2015  0438   MCHC 32.4 11/16/2013 0912   RDW 18.0* 07/22/2015 0438   RDW 16.1* 11/16/2013 0912   LYMPHSABS 1.0 07/18/2015 1023   LYMPHSABS 2.4 11/16/2013 0912   MONOABS 0.4 07/18/2015 1023   EOSABS 0.1 07/18/2015 1023   EOSABS 0.2 11/16/2013 0912   BASOSABS 0.0 07/18/2015 1023   BASOSABS 0.0 11/16/2013 0912     Assessment/Plan:  1. AKI/CKD stage 3 (Scr 1-1.3) in setting of decompensated CHF, low EF and escalating diuretic use. Presumably due to intravascular volume depletion/ATN. Scr improving after gentle hydration. Cont to follow and avoid nephrotoxic agents such as NSAIDs/Cox-II I's, hold ACE/ARB/diuretics for now. 1. Scr and sodium improved off of IVF's. 2. Continue to hold diuretics 2. Acute on chronic systolic CHF- appears euvolemic on exam 3. HTN- stable  4. New A fib- with RVR. Plan per cardiology 5. Hyperkalemia- due to #1. Resolved 6. Anemia- appears acute and work up underway but on hold due to new onset A fib and AKI. 1. Hgb was normal in 2015 but no more recent labs to compare.  Possibly related to CKD but should not be so acute.  Will check iron stores and consider starting EPO. 7. Valvular heart disease- per Cardiology 8. DM- metformin on hold due to AKI. Plan per primary svc.  Kalis Friese A

## 2015-07-22 NOTE — Progress Notes (Signed)
Subjective:  Feeling better. Less SOB  Objective:  Temp:  [97.5 F (36.4 C)-98.5 F (36.9 C)] 98.3 F (36.8 C) (10/11 0857) Pulse Rate:  [60-128] 102 (10/11 0857) Resp:  [18-20] 18 (10/11 0500) BP: (94-112)/(59-83) 97/63 mmHg (10/11 0857) SpO2:  [94 %-98 %] 98 % (10/11 0857) Weight:  [172 lb 8 oz (78.245 kg)] 172 lb 8 oz (78.245 kg) (10/11 0500) Weight change: -12.5 oz (-0.354 kg)  Intake/Output from previous day: 10/10 0701 - 10/11 0700 In: 1020 [P.O.:760; I.V.:260] Out: 1075 [Urine:1075]  Intake/Output from this shift: Total I/O In: 240 [P.O.:240] Out: -   Physical Exam: General appearance: alert and no distress Neck: no adenopathy, no carotid bruit, no JVD, supple, symmetrical, trachea midline and thyroid not enlarged, symmetric, no tenderness/mass/nodules Lungs: clear to auscultation bilaterally Heart: irregularly irregular rhythm Extremities: extremities normal, atraumatic, no cyanosis or edema  Lab Results: Results for orders placed or performed during the hospital encounter of 07/18/15 (from the past 48 hour(s))  Glucose, capillary     Status: Abnormal   Collection Time: 07/20/15 11:58 AM  Result Value Ref Range   Glucose-Capillary 176 (H) 65 - 99 mg/dL   Comment 1 Notify RN   Sodium, urine, random     Status: None   Collection Time: 07/20/15  4:00 PM  Result Value Ref Range   Sodium, Ur 41 mmol/L  Creatinine, urine, random     Status: None   Collection Time: 07/20/15  4:00 PM  Result Value Ref Range   Creatinine, Urine 148.42 mg/dL  Occult blood card to lab, stool RN will collect     Status: None   Collection Time: 07/20/15  5:18 PM  Result Value Ref Range   Fecal Occult Bld NEGATIVE NEGATIVE  Glucose, capillary     Status: Abnormal   Collection Time: 07/20/15  5:33 PM  Result Value Ref Range   Glucose-Capillary 158 (H) 65 - 99 mg/dL   Comment 1 Notify RN   Glucose, capillary     Status: Abnormal   Collection Time: 07/20/15  8:44 PM  Result  Value Ref Range   Glucose-Capillary 155 (H) 65 - 99 mg/dL  Basic metabolic panel     Status: Abnormal   Collection Time: 07/21/15  3:26 AM  Result Value Ref Range   Sodium 134 (L) 135 - 145 mmol/L   Potassium 4.5 3.5 - 5.1 mmol/L    Comment: DELTA CHECK NOTED   Chloride 101 101 - 111 mmol/L   CO2 22 22 - 32 mmol/L   Glucose, Bld 164 (H) 65 - 99 mg/dL   BUN 51 (H) 6 - 20 mg/dL   Creatinine, Ser 2.94 (H) 0.44 - 1.00 mg/dL   Calcium 8.6 (L) 8.9 - 10.3 mg/dL   GFR calc non Af Amer 14 (L) >60 mL/min   GFR calc Af Amer 16 (L) >60 mL/min    Comment: (NOTE) The eGFR has been calculated using the CKD EPI equation. This calculation has not been validated in all clinical situations. eGFR's persistently <60 mL/min signify possible Chronic Kidney Disease.    Anion gap 11 5 - 15  CBC     Status: Abnormal   Collection Time: 07/21/15  3:26 AM  Result Value Ref Range   WBC 5.9 4.0 - 10.5 K/uL   RBC 3.49 (L) 3.87 - 5.11 MIL/uL   Hemoglobin 8.1 (L) 12.0 - 15.0 g/dL   HCT 27.5 (L) 36.0 - 46.0 %   MCV 78.8 78.0 -  100.0 fL   MCH 23.2 (L) 26.0 - 34.0 pg   MCHC 29.5 (L) 30.0 - 36.0 g/dL   RDW 18.1 (H) 11.5 - 15.5 %   Platelets 179 150 - 400 K/uL  Glucose, capillary     Status: Abnormal   Collection Time: 07/21/15  7:41 AM  Result Value Ref Range   Glucose-Capillary 174 (H) 65 - 99 mg/dL  Glucose, capillary     Status: Abnormal   Collection Time: 07/21/15 12:00 PM  Result Value Ref Range   Glucose-Capillary 230 (H) 65 - 99 mg/dL  Glucose, capillary     Status: Abnormal   Collection Time: 07/21/15  4:46 PM  Result Value Ref Range   Glucose-Capillary 170 (H) 65 - 99 mg/dL   Comment 1 Notify RN   Glucose, capillary     Status: Abnormal   Collection Time: 07/21/15  9:06 PM  Result Value Ref Range   Glucose-Capillary 149 (H) 65 - 99 mg/dL  Basic metabolic panel     Status: Abnormal   Collection Time: 07/22/15  4:38 AM  Result Value Ref Range   Sodium 135 135 - 145 mmol/L   Potassium 4.0 3.5  - 5.1 mmol/L   Chloride 102 101 - 111 mmol/L   CO2 23 22 - 32 mmol/L   Glucose, Bld 142 (H) 65 - 99 mg/dL   BUN 45 (H) 6 - 20 mg/dL   Creatinine, Ser 2.25 (H) 0.44 - 1.00 mg/dL   Calcium 9.1 8.9 - 10.3 mg/dL   GFR calc non Af Amer 19 (L) >60 mL/min   GFR calc Af Amer 22 (L) >60 mL/min    Comment: (NOTE) The eGFR has been calculated using the CKD EPI equation. This calculation has not been validated in all clinical situations. eGFR's persistently <60 mL/min signify possible Chronic Kidney Disease.    Anion gap 10 5 - 15  CBC     Status: Abnormal   Collection Time: 07/22/15  4:38 AM  Result Value Ref Range   WBC 6.8 4.0 - 10.5 K/uL   RBC 3.49 (L) 3.87 - 5.11 MIL/uL   Hemoglobin 8.2 (L) 12.0 - 15.0 g/dL   HCT 27.1 (L) 36.0 - 46.0 %   MCV 77.7 (L) 78.0 - 100.0 fL   MCH 23.5 (L) 26.0 - 34.0 pg   MCHC 30.3 30.0 - 36.0 g/dL   RDW 18.0 (H) 11.5 - 15.5 %   Platelets 165 150 - 400 K/uL  Glucose, capillary     Status: Abnormal   Collection Time: 07/22/15  7:33 AM  Result Value Ref Range   Glucose-Capillary 144 (H) 65 - 99 mg/dL    Imaging: Imaging results have been reviewed  Tele- Afib with VVI demand pacing, VR 100-120  Assessment/Plan:   1. Active Problems: 2.   Mild CAD - 20-30% RCA in 2013 3.   Mild aortic stenosis 4.   Essential hypertension 5.   Symptomatic anemia 6.   Atrial fibrillation - developed 07/11/15 by PPM interrogation 7.   Moderate mitral regurgitation 8.   Moderate tricuspid regurgitation 9.   Chronic systolic CHF (congestive heart failure) ([place]) 10.   History of pacemaker - Medtronic 11.   Microcytic anemia 12.   Acute renal failure superimposed on stage 3 chronic kidney disease ([place]) 13.   Diabetes mellitus with nephropathy ([place]) 14.   Time Spent Directly with Patient:  20 minutes  Length of Stay:  LOS: 4 days   Clinically improving, SCr 3.4----> 2.25 (baseline  1.0). Not on diuretic. Hgb stable. No sx of CHF. Afib with VR still increased. Will  titrate BB. Exam benign. Lungs clear. May want to consider starting a NOAC once renal fxn improves with hopes of performing DCCV.   Quay Burow 07/22/2015, 10:43 AM

## 2015-07-23 ENCOUNTER — Encounter: Payer: Self-pay | Admitting: *Deleted

## 2015-07-23 LAB — CBC
HEMATOCRIT: 26.8 % — AB (ref 36.0–46.0)
HEMOGLOBIN: 8.1 g/dL — AB (ref 12.0–15.0)
MCH: 23.9 pg — ABNORMAL LOW (ref 26.0–34.0)
MCHC: 30.2 g/dL (ref 30.0–36.0)
MCV: 79.1 fL (ref 78.0–100.0)
Platelets: 159 10*3/uL (ref 150–400)
RBC: 3.39 MIL/uL — AB (ref 3.87–5.11)
RDW: 18.3 % — AB (ref 11.5–15.5)
WBC: 5.2 10*3/uL (ref 4.0–10.5)

## 2015-07-23 LAB — BASIC METABOLIC PANEL
ANION GAP: 13 (ref 5–15)
BUN: 39 mg/dL — AB (ref 6–20)
CHLORIDE: 100 mmol/L — AB (ref 101–111)
CO2: 21 mmol/L — ABNORMAL LOW (ref 22–32)
Calcium: 9.2 mg/dL (ref 8.9–10.3)
Creatinine, Ser: 1.72 mg/dL — ABNORMAL HIGH (ref 0.44–1.00)
GFR, EST AFRICAN AMERICAN: 31 mL/min — AB (ref >60)
GFR, EST NON AFRICAN AMERICAN: 27 mL/min — AB (ref >60)
Glucose, Bld: 190 mg/dL — ABNORMAL HIGH (ref 65–99)
POTASSIUM: 4 mmol/L (ref 3.5–5.1)
SODIUM: 134 mmol/L — AB (ref 135–145)

## 2015-07-23 LAB — GLUCOSE, CAPILLARY
GLUCOSE-CAPILLARY: 180 mg/dL — AB (ref 65–99)
GLUCOSE-CAPILLARY: 204 mg/dL — AB (ref 65–99)
GLUCOSE-CAPILLARY: 162 mg/dL — AB (ref 65–99)
GLUCOSE-CAPILLARY: 206 mg/dL — AB (ref 65–99)

## 2015-07-23 LAB — BRAIN NATRIURETIC PEPTIDE: B Natriuretic Peptide: 2816.9 pg/mL — ABNORMAL HIGH (ref 0.0–100.0)

## 2015-07-23 MED ORDER — APIXABAN 2.5 MG PO TABS
2.5000 mg | ORAL_TABLET | Freq: Two times a day (BID) | ORAL | Status: DC
  Administered 2015-07-23 – 2015-07-28 (×11): 2.5 mg via ORAL
  Filled 2015-07-23 (×11): qty 1

## 2015-07-23 MED ORDER — FUROSEMIDE 40 MG PO TABS
40.0000 mg | ORAL_TABLET | Freq: Once | ORAL | Status: AC
  Administered 2015-07-23: 40 mg via ORAL
  Filled 2015-07-23: qty 1

## 2015-07-23 MED ORDER — ZOLPIDEM TARTRATE 5 MG PO TABS
5.0000 mg | ORAL_TABLET | Freq: Once | ORAL | Status: AC
  Administered 2015-07-23: 5 mg via ORAL
  Filled 2015-07-23: qty 1

## 2015-07-23 NOTE — Progress Notes (Signed)
Patient ID: [name], female   DOB: 1932/09/13, 81 y.o.   MRN: [id number] S:had some SOB and nausea this am O:BP 130/81 mmHg  Pulse 112  Temp(Src) 97.6 F (36.4 C) (Oral)  Resp 19  Ht 5\' 4"  (1.626 m)  Wt 78.62 kg (173 lb 5.2 oz)  BMI 29.74 kg/m2  SpO2 96%  Intake/Output Summary (Last 24 hours) at 07/23/15 0842 Last data filed at 07/22/15 2106  Gross per 24 hour  Intake    120 ml  Output      0 ml  Net    120 ml   Intake/Output: I/O last 3 completed shifts: In: 360 [P.O.:360] Out: 650 [Urine:650]  Intake/Output this shift:    Weight change: 0.375 kg (13.2 oz) Gen:WD frail, WF in mild distress CVS:IRR IRR, tachycardic Resp:bibasilar crackles VXB:LTJQZE Ext:no edema   Recent Labs Lab 07/17/15 1631 07/18/15 1023 07/19/15 0500 07/20/15 0439 07/21/15 0326 07/22/15 0438 07/23/15 0537  NA 134* 136 135 131* 134* 135 134*  K 5.6* 5.1 5.5* 5.6* 4.5 4.0 4.0  CL 97* 101 100* 98* 101 102 100*  CO2 26 25 23  20* 22 23 21*  GLUCOSE 112* 133* 145* 136* 164* 142* 190*  BUN 29* 32* 40* 49* 51* 45* 39*  CREATININE 1.75* 1.63* 2.44* 3.41* 2.94* 2.25* 1.72*  ALBUMIN 4.0 3.4*  --   --   --   --   --   CALCIUM 9.2 9.2 9.2 8.9 8.6* 9.1 9.2  AST 18 25  --   --   --   --   --   ALT 7 11*  --   --   --   --   --    Liver Function Tests:  Recent Labs Lab 07/17/15 1631 07/18/15 1023  AST 18 25  ALT 7 11*  ALKPHOS 87 91  BILITOT 0.4 0.4  PROT 7.2 6.6  ALBUMIN 4.0 3.4*   No results for input(s): LIPASE, AMYLASE in the last 168 hours. No results for input(s): AMMONIA in the last 168 hours. CBC:  Recent Labs Lab 07/17/15 1631 07/18/15 1023 07/19/15 0500 07/20/15 0439 07/21/15 0326 07/22/15 0438 07/23/15 0537  WBC 5.4 6.7 6.2 6.1 5.9 6.8 5.2  NEUTROABS 2.9 5.1  --   --   --   --   --   HGB 8.7* 8.4* 8.9* 8.6* 8.1* 8.2* 8.1*  HCT 29.1* 28.2* 29.3* 29.0* 27.5* 27.1* 26.8*  MCV 77.8* 78.8 78.8 79.2 78.8 77.7* 79.1  PLT 191 178 175 190 179 165 159   Cardiac  Enzymes:  Recent Labs Lab 07/18/15 1800 07/18/15 2240 07/19/15 0500  TROPONINI 0.03 0.04* 0.04*   CBG:  Recent Labs Lab 07/21/15 2106 07/22/15 0733 07/22/15 1126 07/22/15 1630 07/23/15 0741  GLUCAP 149* 144* 190* 221* 180*    Iron Studies: No results for input(s): IRON, TIBC, TRANSFERRIN, FERRITIN in the last 72 hours. Studies/Results: US Renal  07/21/2015  CLINICAL DATA:  Acute renal failure EXAM: RENAL / URINARY TRACT ULTRASOUND COMPLETE COMPARISON:  None. FINDINGS: Right Kidney: Length: 10.3 cm. Echogenicity within normal limits. No mass or hydronephrosis visualized. Left Kidney: Length: 10.6 cm. Mildly increased echotexture. No mass or hydronephrosis. Bladder: Appears normal for degree of bladder distention. IMPRESSION: No hydronephrosis or acute findings. Mildly increased echotexture on the left. Electronically Signed   By: Charlett Nose M.D.   On: 07/21/2015 11:11   . allopurinol  200 mg Oral Daily  . atorvastatin  20 mg Oral q1800  . DULoxetine  20 mg Oral Daily  . insulin aspart  0-9 Units Subcutaneous TID WC  . metoprolol tartrate  25 mg Oral BID  . montelukast  10 mg Oral QHS  . pantoprazole  40 mg Oral BID  . sodium chloride  3 mL Intravenous Q12H    BMET    Component Value Date/Time   NA 134* 07/23/2015 0537   NA 140 04/28/2015 0857   K 4.0 07/23/2015 0537   CL 100* 07/23/2015 0537   CO2 21* 07/23/2015 0537   GLUCOSE 190* 07/23/2015 0537   GLUCOSE 101* 04/28/2015 0857   BUN 39* 07/23/2015 0537   BUN 18 04/28/2015 0857   CREATININE 1.72* 07/23/2015 0537   CREATININE 1.75* 07/17/2015 1631   CALCIUM 9.2 07/23/2015 0537   GFRNONAA 27* 07/23/2015 0537   GFRAA 31* 07/23/2015 0537   CBC    Component Value Date/Time   WBC 5.2 07/23/2015 0537   WBC 6.0 11/16/2013 0912   RBC 3.39* 07/23/2015 0537   RBC 3.50* 07/18/2015 1800   RBC 3.85 11/16/2013 0912   HGB 8.1* 07/23/2015 0537   HCT 26.8* 07/23/2015 0537   PLT 159 07/23/2015 0537   MCV 79.1  07/23/2015 0537   MCH 23.9* 07/23/2015 0537   MCH 29.4 11/16/2013 0912   MCHC 30.2 07/23/2015 0537   MCHC 32.4 11/16/2013 0912   RDW 18.3* 07/23/2015 0537   RDW 16.1* 11/16/2013 0912   LYMPHSABS 1.0 07/18/2015 1023   LYMPHSABS 2.4 11/16/2013 0912   MONOABS 0.4 07/18/2015 1023   EOSABS 0.1 07/18/2015 1023   EOSABS 0.2 11/16/2013 0912   BASOSABS 0.0 07/18/2015 1023   BASOSABS 0.0 11/16/2013 0912     Assessment/Plan:  1. AKI/CKD stage 3 (Scr 1-1.3) in setting of decompensated CHF, low EF and escalating diuretic use. Presumably due to intravascular volume depletion/ATN. Scr improving after gentle hydration. Cont to follow and avoid nephrotoxic agents such as NSAIDs/Cox-II I's, hold ACE/ARB/diuretics for now. 1. Scr and sodium improved even off of IVF's. 2. Acute on chronic systolic CHF- appears slightly volume overloaded on lung exam 1. Start outpt po lasix dose x 1 and follow exam and labs 3. HTN- stable  4. New A fib- with RVR. Plan per cardiology 5. Hyperkalemia- due to #1. Resolved 6. Anemia- appears acute and work up underway but on hold due to new onset A fib and AKI. 1. Hgb was normal in 2015 but no more recent labs to compare. Possibly related to CKD but should not be so acute.  2. Low iron stores and will replete with IV feraheme 3. consider starting EPO if this has been more chronic (will need outpt records) 7. Valvular heart disease- per Cardiology 8. DM- metformin on hold due to AKI. Plan per primary svc.  Ammaar Encina A

## 2015-07-23 NOTE — Progress Notes (Signed)
Pt complaining of chest pressure and nausea, EKG done, VSS, 02 BNC place at 2 liters, zofran per PRN orders, positioned in bed with HOB up, MD notified, no new orders at present, pt states symptoms improving, will continue to monitor closely.  Raymon Mutton RN

## 2015-07-23 NOTE — Consult Note (Addendum)
   [facility] CM Inpatient Consult   07/23/2015  Danielle Benitez 07/04/1932 [id number]   Patient evaluated for [facility] Care Management services. Came to bedside to explain and offer [facility] Care Management services. Both daughter and son-in-law were at bedside as well. Patient agreeable and consents signed. Explained to patient that she will receive post hospital transition of care calls and will be evaluated for monthly home visits. Discussed that these services will not interfere or replace services provided by home health. Ms. Pepi endorses that she lives with her daughter and son-in-law. Her daughter, Danielle Benitez, states Ms. Geremia could use some teaching about how to check her blood sugar. [name] states reports she has tried to show her mother how to but has not been successful in doing so. Made inpatient RNCM aware of the above. Left contact information and [facility] packet at bedside. Appreciative of visit.  Confirmed best contact number for patient as [phone]. Danielle Benitez, daughter's work number is [phone] ext [phone]. Danielle Benitez- son's work number is [phone]. Son-in-law, Danielle Benitez number is [phone]. Confirmed Primary Care MD as Dr. Chilton Si. Will request for patient to be assigned to [facility] RNCM.   Raiford Noble, MSN-Ed, RN,BSN [facility] Liaison [phone]

## 2015-07-23 NOTE — Progress Notes (Addendum)
ANTICOAGULATION CONSULT NOTE - Initial Consult  Pharmacy Consult for Apixaban Indication: atrial fibrillation  Allergies  Allergen Reactions  . Beta Adrenergic Blockers     Couldn't speak or hear  . Jardiance [Empagliflozin]     Rash, kidney issues  . Fluzone [Flu Virus Vaccine] Other (See Comments)    Feeling unwell for a week. Cold symptoms/flu symtoms  . Tetanus Toxoids     UNKNOWN    Patient Measurements: Height:  (162.6 cm) Weight: 173 lb 5.2 oz (78.62 kg) IBW/kg (Calculated) : 54.7  Vital Signs: Temp: 97.7 F (36.5 C) (10/12 0902) Temp Source: Oral (10/12 0902) BP: 120/74 mmHg (10/12 0902) Pulse Rate: 109 (10/12 0902)  Labs:  Recent Labs  07/21/15 0326 07/22/15 0438 07/23/15 0537  HGB 8.1* 8.2* 8.1*  HCT 27.5* 27.1* 26.8*  PLT 179 165 159  CREATININE 2.94* 2.25* 1.72*    Estimated Creatinine Clearance: 26 mL/min (by C-G formula based on Cr of 1.72).   Medical History: Past Medical History  Diagnosis Date  . Gout   . Osteoarthritis   . Bursitis   . S/P cardiac pacemaker procedure, PPM Medtronic REVO placed 01/11/2012 01/11/2012    a. for symptomatic bradycardia. Followed by Dr. Ladona Ridgel.  . Personal history of fall   . Chronic kidney disease, stage II (mild)   . Memory loss   . Anxiety state, unspecified   . Other and unspecified hyperlipidemia   . Anemia, unspecified   . Abnormality of gait   . Depressive disorder, not elsewhere classified   . Rheumatism, unspecified and fibrositis   . Lumbago   . Nonspecific abnormal results of liver function study   . Obesity, unspecified   . Internal hemorrhoids without mention of complication   . Viremia, unspecified   . Osteoarthrosis, unspecified whether generalized or localized, unspecified site   . Abnormality of gait   . Viremia, unspecified   . Rheumatism, unspecified and fibrositis   . Pain in joint, shoulder region 11/22/2013    leeft   . Pruritus 11/22/2013  . Eczema 11/22/2013  . Full dentures    . HOH (hard of hearing)   . Wears glasses   . Female stress incontinence 05/06/2015  . DM (diabetes mellitus), type 2, uncontrolled, with renal complications (HCC) 01/08/2012  . CKD (chronic kidney disease), stage III   . Aortic stenosis     a. Mild by echo 08/2013.  . Essential hypertension   . Orthostatic hypotension   . Bifascicular block   . Atrial fibrillation (HCC)   . Chronic systolic CHF (congestive heart failure) (HCC)     a. Dx 07/2015 - EF 35-40%, unclear etiology  . Mild CAD     a. Cath 2013: 20-30% prox RCA.    Medications:  Prescriptions prior to admission  Medication Sig Dispense Refill Last Dose  . albuterol (PROVENTIL HFA;VENTOLIN HFA) 108 (90 BASE) MCG/ACT inhaler Inhale 2 puffs into the lungs every 6 (six) hours as needed for wheezing or shortness of breath. 1 Inhaler 0 rescue at rescue  . allopurinol (ZYLOPRIM) 300 MG tablet Take 1 tablet (300 mg total) by mouth daily. 90 tablet 3 07/18/2015 at Unknown time  . apixaban (ELIQUIS) 5 MG TABS tablet Take 1 tablet (5 mg total) by mouth 2 (two) times daily. 180 tablet 3 07/18/2015 at Unknown time  . aspirin 81 MG chewable tablet Chew 81 mg by mouth daily.   07/18/2015 at Unknown time  . atorvastatin (LIPITOR) 40 MG tablet Take 0.5 tablets (20  mg total) by mouth daily at 6 PM. 90 tablet 3 07/17/2015 at Unknown time  . DULoxetine (CYMBALTA) 20 MG capsule Take 1 tablet by mouth daily to help treat anxiety and nerves. 90 capsule 3 07/18/2015 at Unknown time  . furosemide (LASIX) 40 MG tablet One daily to help control fluid retention (Patient taking differently: 40 mg daily. One daily to help control fluid retention) 30 tablet 3 07/18/2015 at Unknown time  . HYDROcodone-acetaminophen (NORCO/VICODIN) 5-325 MG tablet Take 1 tablet by mouth every 6 (six) hours as needed for moderate pain (joint pain).   Past Week at Unknown time  . lisinopril (PRINIVIL,ZESTRIL) 20 MG tablet Take 1/2 tablet by mouth once daily for blood pressure 45 tablet  3 07/18/2015 at Unknown time  . metFORMIN (GLUCOPHAGE-XR) 500 MG 24 hr tablet Take two tablets by mouth each morning to control blood sugar (Patient taking differently: Take 1,000 mg by mouth daily with breakfast. Take two tablets by mouth each morning to control blood sugar) 180 tablet 1 07/18/2015 at Unknown time  . metoprolol tartrate (LOPRESSOR) 25 MG tablet Take 1 tablet (25 mg total) by mouth 2 (two) times daily. 180 tablet 3 07/18/2015 at 0800  . montelukast (SINGULAIR) 10 MG tablet Take 1 tablet (10 mg total) by mouth at bedtime. 30 tablet 3 07/17/2015 at Unknown time  . ONETOUCH DELICA LANCETS FINE MISC Check blood sugar daily as directed DX E11.65 100 each 3 07/17/2015 at Unknown time  . pantoprazole (PROTONIX) 40 MG tablet Take 1 tablet (40 mg total) by mouth daily. 30 tablet 3 07/18/2015 at Unknown time  . saxagliptin HCl (ONGLYZA) 5 MG TABS tablet Take one tablet by mouth once daily for blood sugar (Patient taking differently: Take 5 mg by mouth daily. Take one tablet by mouth once daily for blood sugar) 30 tablet 5 07/18/2015 at Unknown time  . spironolactone (ALDACTONE) 25 MG tablet One daily to reduce fluid retention and to strenghthen the heart (Patient taking differently: Take 25 mg by mouth daily. One daily to reduce fluid retention and to strenghthen the heart) 30 tablet 3 07/18/2015 at Unknown time  . triamcinolone (KENALOG) 0.025 % cream Apply 1 application topically 4 (four) times daily as needed (itching).   Past Week at Unknown time    Assessment: 81 yo F on Apixaban PTA for recent onset afib. PTA dose 5mg  BID. Admitted with Hgb drop from 13.9 to 8.7. GI evaluated for possible GIB but with heme negative stools and no overt bleeding, endoscopy not pursued and GI signed off. Hgb 8.1 - low but stable during this admission. Plan to restart apixaban for afib and DCCV on Monday. Pt with ARF which is improving, SCr down to 1.72. Pt now qualifies for reduced dose of 2.5mg  po BID due to age and  SCr.  Iron studies 10/8: Iron 18, ferritin 9, tsat 4, TIBC 431 indicating moderate to severe iron deficiency anemia  Goal of Therapy:  Prevention of stroke Monitor platelets by anticoagulation protocol: Yes   Plan:  Apixaban 2.5mg  po BID Will follow CBC closely with recent drop in Hgb PTA Will f/u renal function Consider addition of IV iron.  Christoper Fabian, PharmD, BCPS Clinical pharmacist, pager [phone] 07/23/2015,9:59 AM

## 2015-07-23 NOTE — Progress Notes (Signed)
    Subjective: SOB, nausea and sweaty this morning.   Objective: Vital signs in last 24 hours: Temp:  [97.6 F (36.4 C)-98.4 F (36.9 C)] 97.6 F (36.4 C) (10/12 0352) Pulse Rate:  [102-112] 112 (10/11 2049) Resp:  [16-19] 19 (10/12 0352) BP: (92-130)/(56-93) 130/81 mmHg (10/12 0352) SpO2:  [96 %-98 %] 96 % (10/12 0352) Weight:  [173 lb 5.2 oz (78.62 kg)] 173 lb 5.2 oz (78.62 kg) (10/12 0352) Last BM Date: 07/20/15  Intake/Output from previous day: 10/11 0701 - 10/12 0700 In: 360 [P.O.:360] Out: -  Intake/Output this shift:    Medications Scheduled Meds: . allopurinol  200 mg Oral Daily  . atorvastatin  20 mg Oral q1800  . DULoxetine  20 mg Oral Daily  . insulin aspart  0-9 Units Subcutaneous TID WC  . metoprolol tartrate  25 mg Oral BID  . montelukast  10 mg Oral QHS  . pantoprazole  40 mg Oral BID  . sodium chloride  3 mL Intravenous Q12H   Continuous Infusions: . sodium chloride Stopped (07/21/15 1123)   PRN Meds:.sodium chloride, acetaminophen, albuterol, ondansetron (ZOFRAN) IV, sodium chloride  PE: General appearance: alert, cooperative and no distress Lungs: clear to auscultation bilaterally Heart: irregularly irregular rhythm and -MRG Extremities: No LEE Pulses: 2+ and symmetric Skin: Warm and dry Neurologic: Grossly normal  Lab Results:   Recent Labs  07/21/15 0326 07/22/15 0438 07/23/15 0537  WBC 5.9 6.8 5.2  HGB 8.1* 8.2* 8.1*  HCT 27.5* 27.1* 26.8*  PLT 179 165 159   BMET  Recent Labs  07/21/15 0326 07/22/15 0438 07/23/15 0537  NA 134* 135 134*  K 4.5 4.0 4.0  CL 101 102 100*  CO2 22 23 21*  GLUCOSE 164* 142* 190*  BUN 51* 45* 39*  CREATININE 2.94* 2.25* 1.72*  CALCIUM 8.6* 9.1 9.2      Assessment/Plan   Active Problems:   Mild CAD - 20-30% RCA in 2013   Mild aortic stenosis   Essential hypertension   Symptomatic anemia   Atrial fibrillation - developed 07/11/15 by PPM interrogation   Moderate mitral regurgitation  Moderate tricuspid regurgitation   Chronic systolic CHF (congestive heart failure) (HCC)   History of pacemaker - Medtronic   Microcytic anemia   Acute renal failure superimposed on stage 3 chronic kidney disease (HCC)   Diabetes mellitus with nephropathy (HCC)  SCr continues to improve: 1.72 today.  HGB has been stable during entire stay.  Currently 8.1.  In June it was 13.9.    Heme nega stool.  Afib rate stable although a bit higher than I would like.  Metoprolol increased to 25 yesterday.  Continue current dose and titrate tomorrow or this evening if possible.  Zofran for nausea. Afebrile.  Continue to get up and ambulate.      LOS: 5 days    HAGER, BRYAN PA-C 07/23/2015 8:02 AM   Agree with note written by Jones Skene PAC  Scr continues to decline however pt has increasing SOB. HGB stable. Diuretics on hold. AFIB with CVR. She has basilar crackles on exam. Will start Eliquis and plan TEE DCCV Mond. Follow HGB. If she continues to have increasing SOB may have to re start diuretics.  Nanetta Batty 07/23/2015 9:50 AM

## 2015-07-23 NOTE — Progress Notes (Signed)
Pharmacist Heart Failure Core Measure Documentation  Assessment: Danielle Benitez has an EF documented as 35-40% on 10/6 by ECHO.  Rationale: Heart failure patients with left ventricular systolic dysfunction (LVSD) and an EF < 40% should be prescribed an angiotensin converting enzyme inhibitor (ACEI) or angiotensin receptor blocker (ARB) at discharge unless a contraindication is documented in the medical record.  This patient is not currently on an ACEI or ARB for HF.  This note is being placed in the record in order to provide documentation that a contraindication to the use of these agents is present for this encounter.  ACE Inhibitor or Angiotensin Receptor Blocker is contraindicated (specify all that apply)  []   ACEI allergy AND ARB allergy []   Angioedema []   Moderate or severe aortic stenosis []   Hyperkalemia []   Hypotension []   Renal artery stenosis [x]   Worsening renal function, preexisting renal disease or dysfunction   [name] 07/23/2015 8:45 AM

## 2015-07-23 NOTE — Patient Outreach (Signed)
[facility] Customer service manager [facility]) Care Management  07/23/2015  Danielle Benitez Feb 28, 1932 [id number]   Referral from Charlesetta Shanks, RN to assign [employer], assigned Ma Rings Minor, Charity fundraiser (for Jodi Mourning, RN).  Thanks, Corrie Mckusick. Sharlee Blew [facility] Care Management [facility] CM Assistant Phone: [phone] Fax: [phone]

## 2015-07-24 LAB — BASIC METABOLIC PANEL
ANION GAP: 9 (ref 5–15)
BUN: 37 mg/dL — ABNORMAL HIGH (ref 6–20)
CALCIUM: 9 mg/dL (ref 8.9–10.3)
CO2: 23 mmol/L (ref 22–32)
Chloride: 98 mmol/L — ABNORMAL LOW (ref 101–111)
Creatinine, Ser: 1.92 mg/dL — ABNORMAL HIGH (ref 0.44–1.00)
GFR calc Af Amer: 27 mL/min — ABNORMAL LOW (ref >60)
GFR, EST NON AFRICAN AMERICAN: 23 mL/min — AB (ref >60)
GLUCOSE: 154 mg/dL — AB (ref 65–99)
Potassium: 4.1 mmol/L (ref 3.5–5.1)
SODIUM: 130 mmol/L — AB (ref 135–145)

## 2015-07-24 LAB — CBC
HCT: 28.3 % — ABNORMAL LOW (ref 36.0–46.0)
HEMOGLOBIN: 8.4 g/dL — AB (ref 12.0–15.0)
MCH: 23.6 pg — ABNORMAL LOW (ref 26.0–34.0)
MCHC: 29.7 g/dL — AB (ref 30.0–36.0)
MCV: 79.5 fL (ref 78.0–100.0)
Platelets: 203 10*3/uL (ref 150–400)
RBC: 3.56 MIL/uL — ABNORMAL LOW (ref 3.87–5.11)
RDW: 18.3 % — AB (ref 11.5–15.5)
WBC: 5.9 10*3/uL (ref 4.0–10.5)

## 2015-07-24 LAB — GLUCOSE, CAPILLARY
GLUCOSE-CAPILLARY: 151 mg/dL — AB (ref 65–99)
GLUCOSE-CAPILLARY: 182 mg/dL — AB (ref 65–99)
Glucose-Capillary: 236 mg/dL — ABNORMAL HIGH (ref 65–99)
Glucose-Capillary: 195 mg/dL — ABNORMAL HIGH (ref 65–99)

## 2015-07-24 LAB — CREATININE, URINE, RANDOM: CREATININE, URINE: 195.91 mg/dL

## 2015-07-24 LAB — SODIUM, URINE, RANDOM

## 2015-07-24 MED ORDER — SODIUM CHLORIDE 0.9 % IJ SOLN: 3.0000 mL | INTRAMUSCULAR | Status: DC | PRN

## 2015-07-24 MED ORDER — SODIUM CHLORIDE 0.9 % IJ SOLN
3.0000 mL | Freq: Two times a day (BID) | INTRAMUSCULAR | Status: DC
  Administered 2015-07-24 – 2015-07-25 (×3): 3 mL via INTRAVENOUS
  Administered 2015-07-25: 10 mL via INTRAVENOUS
  Administered 2015-07-26 – 2015-07-28 (×4): 3 mL via INTRAVENOUS

## 2015-07-24 MED ORDER — SODIUM CHLORIDE 0.9 % IV SOLN: 250.0000 mL | INTRAVENOUS | Status: DC

## 2015-07-24 MED ORDER — SODIUM CHLORIDE 0.9 % IV SOLN
INTRAVENOUS | Status: DC
  Administered 2015-07-24: 23:00:00 via INTRAVENOUS

## 2015-07-24 NOTE — Care Management Note (Addendum)
Case Management Note  Patient Details  Name: Danielle Benitez MRN: [id number] Date of Birth: 02-Jan-1932  Subjective/Objective: Pt admitted for SOB. Pt with increased Cr. Nephrology has been following. Plan for TEE Cardioversion 07-25-15.                    Action/Plan: CM did leave agency choice at bedside for Daughter to see and choose. CM will f/u in am.    Expected Discharge Date:                  Expected Discharge Plan:  Home w Home Health Services  In-House Referral:  NA  Discharge planning Services  CM Consult  Post Acute Care Choice:    Choice offered to:     DME Arranged:    DME Agency:     HH Arranged:  PT HH Agency:   [facility]  Status of Service:  In process, will continue to follow  Medicare Important Message Given:  Yes-third notification given Date Medicare IM Given:    Medicare IM give by:    Date Additional Medicare IM Given:    Additional Medicare Important Message give by:     If discussed at Long Length of Stay Meetings, dates discussed:    Additional Comments:  89 Daughter did call CM back and made choice for [facility] for [facility] PT Services. Pt will need orders. Daughter did state that pt will need shower chair as well and order will need to be written. CM did call [facility] for [facility] PT Services- and SOC to begin within 24-48 hrs of d/c.   1510 Tomi Bamberger, RN,BSN [phone] CM did leave voice mail for pt's daughter [name] in regards to [facility] agency choice.   Gala Lewandowsky, RN 07/24/2015, 3:00 PM

## 2015-07-24 NOTE — Care Management Important Message (Signed)
Important Message  Patient Details  Name: Danielle Benitez MRN: [id number] Date of Birth: 1932/04/05   Medicare Important Message Given:  Yes-third notification given    Kyla Balzarine 07/24/2015, 10:58 AM

## 2015-07-24 NOTE — Progress Notes (Addendum)
    [facility] has been requested to perform a transesophageal echocardiogram and direct current cardioversion on Danielle Benitez for atrial fibrillation.  After careful review of history and examination, the risks and benefits of transesophageal echocardiogram have been explained including risks of esophageal damage, perforation (1:10,000 risk), bleeding, pharyngeal hematoma as well as other potential complications associated with conscious sedation including aspiration, arrhythmia, respiratory failure and death. Alternatives to treatment were discussed, questions were answered. Patient is willing to proceed.   Also spoke with the patient's daughter, [name] # [phone], per the patient's request and explained the procedure in detail, including risks and benefits. She is in agreement for her mother to have the procedure tomorrow as well.   Ellsworth Lennox, PA-C 07/24/2015 11:51 AM    Agree with note by Alanda Amass, M.D., Roseanne Reno [facility] [address] [place], [place]  [address]  [phone] 07/25/2015 9:11 AM

## 2015-07-24 NOTE — Progress Notes (Signed)
ANTICOAGULATION CONSULT NOTE - Initial Consult  Pharmacy Consult for Apixaban Indication: atrial fibrillation  Allergies  Allergen Reactions  . Beta Adrenergic Blockers     Couldn't speak or hear  . Jardiance [Empagliflozin]     Rash, kidney issues  . Fluzone [Flu Virus Vaccine] Other (See Comments)    Feeling unwell for a week. Cold symptoms/flu symtoms  . Tetanus Toxoids     UNKNOWN    Patient Measurements: Height:  (162.6 cm) Weight: 179 lb 9.6 oz (81.466 kg) IBW/kg (Calculated) : 54.7  Vital Signs: Temp: 98.2 F (36.8 C) (10/13 0500) Temp Source: Oral (10/13 0500) BP: 95/54 mmHg (10/13 0500) Pulse Rate: 100 (10/13 0500)  Labs:  Recent Labs  07/22/15 0438 07/23/15 0537 07/24/15 0353  HGB 8.2* 8.1* 8.4*  HCT 27.1* 26.8* 28.3*  PLT 165 159 203  CREATININE 2.25* 1.72* 1.92*    Estimated Creatinine Clearance: 23.7 mL/min (by C-G formula based on Cr of 1.92).   Medical History: Past Medical History  Diagnosis Date  . Gout   . Osteoarthritis   . Bursitis   . S/P cardiac pacemaker procedure, PPM Medtronic REVO placed 01/11/2012 01/11/2012    a. for symptomatic bradycardia. Followed by Dr. Ladona Ridgel.  . Personal history of fall   . Chronic kidney disease, stage II (mild)   . Memory loss   . Anxiety state, unspecified   . Other and unspecified hyperlipidemia   . Anemia, unspecified   . Abnormality of gait   . Depressive disorder, not elsewhere classified   . Rheumatism, unspecified and fibrositis   . Lumbago   . Nonspecific abnormal results of liver function study   . Obesity, unspecified   . Internal hemorrhoids without mention of complication   . Viremia, unspecified   . Osteoarthrosis, unspecified whether generalized or localized, unspecified site   . Abnormality of gait   . Viremia, unspecified   . Rheumatism, unspecified and fibrositis   . Pain in joint, shoulder region 11/22/2013    leeft   . Pruritus 11/22/2013  . Eczema 11/22/2013  . Full  dentures   . HOH (hard of hearing)   . Wears glasses   . Female stress incontinence 05/06/2015  . DM (diabetes mellitus), type 2, uncontrolled, with renal complications (HCC) 01/08/2012  . CKD (chronic kidney disease), stage III   . Aortic stenosis     a. Mild by echo 08/2013.  . Essential hypertension   . Orthostatic hypotension   . Bifascicular block   . Atrial fibrillation (HCC)   . Chronic systolic CHF (congestive heart failure) (HCC)     a. Dx 07/2015 - EF 35-40%, unclear etiology  . Mild CAD     a. Cath 2013: 20-30% prox RCA.    Medications:  Prescriptions prior to admission  Medication Sig Dispense Refill Last Dose  . albuterol (PROVENTIL HFA;VENTOLIN HFA) 108 (90 BASE) MCG/ACT inhaler Inhale 2 puffs into the lungs every 6 (six) hours as needed for wheezing or shortness of breath. 1 Inhaler 0 rescue at rescue  . allopurinol (ZYLOPRIM) 300 MG tablet Take 1 tablet (300 mg total) by mouth daily. 90 tablet 3 07/18/2015 at Unknown time  . apixaban (ELIQUIS) 5 MG TABS tablet Take 1 tablet (5 mg total) by mouth 2 (two) times daily. 180 tablet 3 07/18/2015 at Unknown time  . aspirin 81 MG chewable tablet Chew 81 mg by mouth daily.   07/18/2015 at Unknown time  . atorvastatin (LIPITOR) 40 MG tablet Take 0.5 tablets (20  mg total) by mouth daily at 6 PM. 90 tablet 3 07/17/2015 at Unknown time  . DULoxetine (CYMBALTA) 20 MG capsule Take 1 tablet by mouth daily to help treat anxiety and nerves. 90 capsule 3 07/18/2015 at Unknown time  . furosemide (LASIX) 40 MG tablet One daily to help control fluid retention (Patient taking differently: 40 mg daily. One daily to help control fluid retention) 30 tablet 3 07/18/2015 at Unknown time  . HYDROcodone-acetaminophen (NORCO/VICODIN) 5-325 MG tablet Take 1 tablet by mouth every 6 (six) hours as needed for moderate pain (joint pain).   Past Week at Unknown time  . lisinopril (PRINIVIL,ZESTRIL) 20 MG tablet Take 1/2 tablet by mouth once daily for blood pressure  45 tablet 3 07/18/2015 at Unknown time  . metFORMIN (GLUCOPHAGE-XR) 500 MG 24 hr tablet Take two tablets by mouth each morning to control blood sugar (Patient taking differently: Take 1,000 mg by mouth daily with breakfast. Take two tablets by mouth each morning to control blood sugar) 180 tablet 1 07/18/2015 at Unknown time  . metoprolol tartrate (LOPRESSOR) 25 MG tablet Take 1 tablet (25 mg total) by mouth 2 (two) times daily. 180 tablet 3 07/18/2015 at 0800  . montelukast (SINGULAIR) 10 MG tablet Take 1 tablet (10 mg total) by mouth at bedtime. 30 tablet 3 07/17/2015 at Unknown time  . ONETOUCH DELICA LANCETS FINE MISC Check blood sugar daily as directed DX E11.65 100 each 3 07/17/2015 at Unknown time  . pantoprazole (PROTONIX) 40 MG tablet Take 1 tablet (40 mg total) by mouth daily. 30 tablet 3 07/18/2015 at Unknown time  . saxagliptin HCl (ONGLYZA) 5 MG TABS tablet Take one tablet by mouth once daily for blood sugar (Patient taking differently: Take 5 mg by mouth daily. Take one tablet by mouth once daily for blood sugar) 30 tablet 5 07/18/2015 at Unknown time  . spironolactone (ALDACTONE) 25 MG tablet One daily to reduce fluid retention and to strenghthen the heart (Patient taking differently: Take 25 mg by mouth daily. One daily to reduce fluid retention and to strenghthen the heart) 30 tablet 3 07/18/2015 at Unknown time  . triamcinolone (KENALOG) 0.025 % cream Apply 1 application topically 4 (four) times daily as needed (itching).   Past Week at Unknown time    Assessment: 81 yo F on Apixaban PTA for recent onset afib. PTA dose 5mg  BID. Admitted with Hgb drop from 13.9 to 8.7. GI evaluated for possible GIB but with heme negative stools and no overt bleeding, endoscopy not pursued and GI signed off. Hgb 8.4 - low but stable during this admission.Pt with ARF which is improving, SCr down to 1.92. Pt now qualifies for reduced dose of 2.5mg  po BID due to age and SCr.  Goal of Therapy:  Prevention of  stroke Monitor platelets by anticoagulation protocol: Yes   Plan:  Apixaban 2.5mg  po BID Will follow CBC closely with recent drop in Hgb PTA Will f/u renal function  Pharmacy will sign-off but are available as needed  Isaac Bliss, PharmD, BCPS Clinical Pharmacist Pager [phone] 07/24/2015 1:25 PM

## 2015-07-24 NOTE — Progress Notes (Signed)
Subjective:  Denies CP/SOB  Objective:  Temp:  [98.2 F (36.8 C)-98.4 F (36.9 C)] 98.2 F (36.8 C) (10/13 0500) Pulse Rate:  [100] 100 (10/13 0500) Resp:  [16-20] 16 (10/13 0500) BP: (95-115)/(54-68) 95/54 mmHg (10/13 0500) SpO2:  [96 %-100 %] 96 % (10/13 0500) Weight:  [179 lb 9.6 oz (81.466 kg)] 179 lb 9.6 oz (81.466 kg) (10/13 0600) Weight change: 6 lb 4.4 oz (2.846 kg)  Intake/Output from previous day: 10/12 0701 - 10/13 0700 In: 300 [P.O.:300] Out: -   Intake/Output from this shift:    Physical Exam: General appearance: alert and no distress Neck: no adenopathy, no carotid bruit, no JVD, supple, symmetrical, trachea midline and thyroid not enlarged, symmetric, no tenderness/mass/nodules Lungs: scattered basilar crackles Heart: irregularly irregular rhythm Extremities: extremities normal, atraumatic, no cyanosis or edema  Lab Results: Results for orders placed or performed during the hospital encounter of 07/18/15 (from the past 48 hour(s))  Glucose, capillary     Status: Abnormal   Collection Time: 07/22/15 11:26 AM  Result Value Ref Range   Glucose-Capillary 190 (H) 65 - 99 mg/dL  Glucose, capillary     Status: Abnormal   Collection Time: 07/22/15  4:30 PM  Result Value Ref Range   Glucose-Capillary 221 (H) 65 - 99 mg/dL  TSH     Status: None   Collection Time: 07/22/15  5:06 PM  Result Value Ref Range   TSH 1.260 0.350 - 4.500 uIU/mL  Basic metabolic panel     Status: Abnormal   Collection Time: 07/23/15  5:37 AM  Result Value Ref Range   Sodium 134 (L) 135 - 145 mmol/L   Potassium 4.0 3.5 - 5.1 mmol/L   Chloride 100 (L) 101 - 111 mmol/L   CO2 21 (L) 22 - 32 mmol/L   Glucose, Bld 190 (H) 65 - 99 mg/dL   BUN 39 (H) 6 - 20 mg/dL   Creatinine, Ser 1.72 (H) 0.44 - 1.00 mg/dL   Calcium 9.2 8.9 - 10.3 mg/dL   GFR calc non Af Amer 27 (L) >60 mL/min   GFR calc Af Amer 31 (L) >60 mL/min    Comment: (NOTE) The eGFR has been calculated using the CKD EPI  equation. This calculation has not been validated in all clinical situations. eGFR's persistently <60 mL/min signify possible Chronic Kidney Disease.    Anion gap 13 5 - 15  CBC     Status: Abnormal   Collection Time: 07/23/15  5:37 AM  Result Value Ref Range   WBC 5.2 4.0 - 10.5 K/uL   RBC 3.39 (L) 3.87 - 5.11 MIL/uL   Hemoglobin 8.1 (L) 12.0 - 15.0 g/dL   HCT 26.8 (L) 36.0 - 46.0 %   MCV 79.1 78.0 - 100.0 fL   MCH 23.9 (L) 26.0 - 34.0 pg   MCHC 30.2 30.0 - 36.0 g/dL   RDW 18.3 (H) 11.5 - 15.5 %   Platelets 159 150 - 400 K/uL  Glucose, capillary     Status: Abnormal   Collection Time: 07/23/15  7:41 AM  Result Value Ref Range   Glucose-Capillary 180 (H) 65 - 99 mg/dL  Brain natriuretic peptide     Status: Abnormal   Collection Time: 07/23/15 10:10 AM  Result Value Ref Range   B Natriuretic Peptide 2816.9 (H) 0.0 - 100.0 pg/mL  Glucose, capillary     Status: Abnormal   Collection Time: 07/23/15 11:24 AM  Result Value Ref Range   Glucose-Capillary  204 (H) 65 - 99 mg/dL  Glucose, capillary     Status: Abnormal   Collection Time: 07/23/15  4:37 PM  Result Value Ref Range   Glucose-Capillary 162 (H) 65 - 99 mg/dL  Glucose, capillary     Status: Abnormal   Collection Time: 07/23/15 10:06 PM  Result Value Ref Range   Glucose-Capillary 206 (H) 65 - 99 mg/dL  Basic metabolic panel     Status: Abnormal   Collection Time: 07/24/15  3:53 AM  Result Value Ref Range   Sodium 130 (L) 135 - 145 mmol/L   Potassium 4.1 3.5 - 5.1 mmol/L   Chloride 98 (L) 101 - 111 mmol/L   CO2 23 22 - 32 mmol/L   Glucose, Bld 154 (H) 65 - 99 mg/dL   BUN 37 (H) 6 - 20 mg/dL   Creatinine, Ser 1.92 (H) 0.44 - 1.00 mg/dL   Calcium 9.0 8.9 - 10.3 mg/dL   GFR calc non Af Amer 23 (L) >60 mL/min   GFR calc Af Amer 27 (L) >60 mL/min    Comment: (NOTE) The eGFR has been calculated using the CKD EPI equation. This calculation has not been validated in all clinical situations. eGFR's persistently <60 mL/min  signify possible Chronic Kidney Disease.    Anion gap 9 5 - 15  CBC     Status: Abnormal   Collection Time: 07/24/15  3:53 AM  Result Value Ref Range   WBC 5.9 4.0 - 10.5 K/uL   RBC 3.56 (L) 3.87 - 5.11 MIL/uL   Hemoglobin 8.4 (L) 12.0 - 15.0 g/dL   HCT 28.3 (L) 36.0 - 46.0 %   MCV 79.5 78.0 - 100.0 fL   MCH 23.6 (L) 26.0 - 34.0 pg   MCHC 29.7 (L) 30.0 - 36.0 g/dL   RDW 18.3 (H) 11.5 - 15.5 %   Platelets 203 150 - 400 K/uL  Glucose, capillary     Status: Abnormal   Collection Time: 07/24/15  8:14 AM  Result Value Ref Range   Glucose-Capillary 151 (H) 65 - 99 mg/dL    Imaging: Imaging results have been reviewed  Tele- Afib with VR 90-110, intermittent demand pacing  Assessment/Plan:   1. Active Problems: 2.   Mild CAD - 20-30% RCA in 2013 3.   Mild aortic stenosis 4.   Essential hypertension 5.   Symptomatic anemia 6.   Atrial fibrillation - developed 07/11/15 by PPM interrogation 7.   Moderate mitral regurgitation 8.   Moderate tricuspid regurgitation 9.   Chronic systolic CHF (congestive heart failure) ([place]) 10.   History of pacemaker - Medtronic 11.   Microcytic anemia 12.   Acute renal failure superimposed on stage 3 chronic kidney disease ([place]) 13.   Diabetes mellitus with nephropathy ([place]) 14.   Time Spent Directly with Patient:  20 minutes  Length of Stay:  LOS: 6 days   Eliquis 2.5 mg PO BIB started yesterday. Afib rate slightly better controlled on metop. SCr increased 1.72---> 1.92 today after lasix 40 mg PO given yesterday. Exam benign with scattered basilar crackles. Plan TEE DCCV Monday. Hopefully restoration of NSR will improve CO and renal fxn.   Quay Burow 07/24/2015, 10:20 AM

## 2015-07-24 NOTE — Progress Notes (Signed)
Physical Therapy Treatment Patient Details Name: Danielle Benitez MRN: [id number] DOB: 1932-05-21 Today's Date: 07/24/2015    History of Present Illness Ms. Ritz is a 81 y.o. female with history of HTN, orthostatic hypotension, DM, mild aortic stenosis, CKD stage III, symptomatic bradycardia s/p Medtronic PPM, RBBB/LAFB, dyslipidemia, nonobstructive CAD in 2013 (20-30% prox RCA), and diagnosis of LV dysfunction/afib who presented to the ER for worsening hemoglobin level.     PT Comments    Patient in bed, eager to participate in PT today - she is dedicated to get home safely to play with her grandson. Patient was able to ambulate, transfer and exercise as described below. HR was 98-108 bpm in sitting, 108-111 bpm during ambulation with a brief peak at 125 bpm that quickly returned to previous range. Telemetry monitored throughout. Patient will benefit from continued PT to improve ambulatory endurance, strength, and independence with transfers and ambulation while in the hospital. Patient agrees that she will benefit from HHPT upon d/c to return to PLOF.   Follow Up Recommendations  Home health PT;Supervision - Intermittent     Equipment Recommendations  None recommended by PT    Recommendations for Other Services       Precautions / Restrictions Precautions Precautions: Fall Restrictions Weight Bearing Restrictions: No    Mobility  Bed Mobility Overal bed mobility: Modified Independent             General bed mobility comments: HOB elevated, able to pull self to sitting with use of bed rails.  Transfers Overall transfer level: Needs assistance Equipment used: Rolling walker (2 wheeled) Transfers: Sit to/from Stand Sit to Stand: Supervision         General transfer comment: Able to stand without physical assistance, heavy use of RW and grab bars in the bathroom. Able to stand 3 times and perform independent pericare after use of toilet.  Ambulation/Gait Ambulation/Gait  assistance: Min guard Ambulation Distance (Feet): 80 Feet Assistive device: Rolling walker (2 wheeled) Gait Pattern/deviations: Step-through pattern;Decreased step length - left;Decreased stance time - right;Decreased weight shift to right;Trunk flexed;Drifts right/left Gait velocity: Erratic Gait velocity interpretation: Below normal speed for age/gender General Gait Details: Patient safer with use of RW today than previous notes. Required min VC's for placement within walker. At times an erratic gait speed - will speed up randomly and then come to a stop. Educated patient on need for smooth, constant gait speed for safety and endurance. Patient thoroughly fatigued at end of session. Trunk thoroughly flexed throughout, patient looks at ground throughout ambulation.   Stairs            Wheelchair Mobility    Modified Rankin (Stroke Patients Only)       Balance Overall balance assessment: Needs assistance Sitting-balance support: Feet supported;No upper extremity supported Sitting balance-Leahy Scale: Good     Standing balance support: Bilateral upper extremity supported;During functional activity Standing balance-Leahy Scale: Fair Standing balance comment: Requires use of RW for balance during activity. Was able to stand and balance without physical assistance or use of RW for pericare, which patient performed independently.                    Cognition Arousal/Alertness: Awake/alert Behavior During Therapy: WFL for tasks assessed/performed Overall Cognitive Status: Within Functional Limits for tasks assessed                      Exercises General Exercises - Lower Extremity Long Arc Quad: AROM;Both;15 reps;Seated  Hip Flexion/Marching: AROM;Both;10 reps;Seated    General Comments        Pertinent Vitals/Pain Pain Assessment: No/denies pain    Home Living                      Prior Function            PT Goals (current goals can now be  found in the care plan section) Acute Rehab PT Goals Patient Stated Goal: to get stronger so she can go back to spending time w/ her grandson PT Goal Formulation: With patient Time For Goal Achievement: 08/05/15 Potential to Achieve Goals: Good Progress towards PT goals: Progressing toward goals    Frequency  Min 3X/week    PT Plan Current plan remains appropriate    Co-evaluation             End of Session Equipment Utilized During Treatment: Gait belt Activity Tolerance: Patient limited by fatigue Patient left: in chair;with call bell/phone within reach     Time: 1155-1217 PT Time Calculation (min) (ACUTE ONLY): 22 min  Charges:  $Gait Training: 8-22 mins                    G CodesMichele Rockers, SPT [phone] 07/24/2015, 1:47 PM

## 2015-07-24 NOTE — Progress Notes (Signed)
Patient ID: Danielle Benitez, female   DOB: 05-05-1932, 81 y.o.   MRN: [id number] S:feels better today O:BP 95/54 mmHg  Pulse 100  Temp(Src) 98.2 F (36.8 C) (Oral)  Resp 16  Ht 5\' 4"  (1.626 m)  Wt 81.466 kg (179 lb 9.6 oz)  BMI 30.81 kg/m2  SpO2 96%  Intake/Output Summary (Last 24 hours) at 07/24/15 0758 Last data filed at 07/24/15 0600  Gross per 24 hour  Intake    300 ml  Output      0 ml  Net    300 ml   Intake/Output: I/O last 3 completed shifts: In: 420 [P.O.:420] Out: -   Intake/Output this shift:    Weight change: 2.846 kg (6 lb 4.4 oz) NTI:RWERX elderly WF in NAD CVS:IRR IRR Resp:bibasilar crackles VQM:GQQPYP Ext:no edema   Recent Labs Lab 07/17/15 1631 07/18/15 1023 07/19/15 0500 07/20/15 0439 07/21/15 0326 07/22/15 0438 07/23/15 0537 07/24/15 0353  NA 134* 136 135 131* 134* 135 134* 130*  K 5.6* 5.1 5.5* 5.6* 4.5 4.0 4.0 4.1  CL 97* 101 100* 98* 101 102 100* 98*  CO2 26 25 23  20* 22 23 21* 23  GLUCOSE 112* 133* 145* 136* 164* 142* 190* 154*  BUN 29* 32* 40* 49* 51* 45* 39* 37*  CREATININE 1.75* 1.63* 2.44* 3.41* 2.94* 2.25* 1.72* 1.92*  ALBUMIN 4.0 3.4*  --   --   --   --   --   --   CALCIUM 9.2 9.2 9.2 8.9 8.6* 9.1 9.2 9.0  AST 18 25  --   --   --   --   --   --   ALT 7 11*  --   --   --   --   --   --    Liver Function Tests:  Recent Labs Lab 07/17/15 1631 07/18/15 1023  AST 18 25  ALT 7 11*  ALKPHOS 87 91  BILITOT 0.4 0.4  PROT 7.2 6.6  ALBUMIN 4.0 3.4*   No results for input(s): LIPASE, AMYLASE in the last 168 hours. No results for input(s): AMMONIA in the last 168 hours. CBC:  Recent Labs Lab 07/17/15 1631 07/18/15 1023  07/20/15 0439 07/21/15 0326 07/22/15 0438 07/23/15 0537 07/24/15 0353  WBC 5.4 6.7  < > 6.1 5.9 6.8 5.2 5.9  NEUTROABS 2.9 5.1  --   --   --   --   --   --   HGB 8.7* 8.4*  < > 8.6* 8.1* 8.2* 8.1* 8.4*  HCT 29.1* 28.2*  < > 29.0* 27.5* 27.1* 26.8* 28.3*  MCV 77.8* 78.8  < > 79.2 78.8 77.7* 79.1 79.5  PLT  191 178  < > 190 179 165 159 203  < > = values in this interval not displayed. Cardiac Enzymes:  Recent Labs Lab 07/18/15 1800 07/18/15 2240 07/19/15 0500  TROPONINI 0.03 0.04* 0.04*   CBG:  Recent Labs Lab 07/22/15 1630 07/23/15 0741 07/23/15 1124 07/23/15 1637 07/23/15 2206  GLUCAP 221* 180* 204* 162* 206*    Iron Studies: No results for input(s): IRON, TIBC, TRANSFERRIN, FERRITIN in the last 72 hours. Studies/Results: No results found. Marland Kitchen allopurinol  200 mg Oral Daily  . apixaban  2.5 mg Oral BID  . atorvastatin  20 mg Oral q1800  . DULoxetine  20 mg Oral Daily  . insulin aspart  0-9 Units Subcutaneous TID WC  . metoprolol tartrate  25 mg Oral BID  . montelukast  10 mg Oral  QHS  . pantoprazole  40 mg Oral BID  . sodium chloride  3 mL Intravenous Q12H    BMET    Component Value Date/Time   NA 130* 07/24/2015 0353   NA 140 04/28/2015 0857   K 4.1 07/24/2015 0353   CL 98* 07/24/2015 0353   CO2 23 07/24/2015 0353   GLUCOSE 154* 07/24/2015 0353   GLUCOSE 101* 04/28/2015 0857   BUN 37* 07/24/2015 0353   BUN 18 04/28/2015 0857   CREATININE 1.92* 07/24/2015 0353   CREATININE 1.75* 07/17/2015 1631   CALCIUM 9.0 07/24/2015 0353   GFRNONAA 23* 07/24/2015 0353   GFRAA 27* 07/24/2015 0353   CBC    Component Value Date/Time   WBC 5.9 07/24/2015 0353   WBC 6.0 11/16/2013 0912   RBC 3.56* 07/24/2015 0353   RBC 3.50* 07/18/2015 1800   RBC 3.85 11/16/2013 0912   HGB 8.4* 07/24/2015 0353   HCT 28.3* 07/24/2015 0353   PLT 203 07/24/2015 0353   MCV 79.5 07/24/2015 0353   MCH 23.6* 07/24/2015 0353   MCH 29.4 11/16/2013 0912   MCHC 29.7* 07/24/2015 0353   MCHC 32.4 11/16/2013 0912   RDW 18.3* 07/24/2015 0353   RDW 16.1* 11/16/2013 0912   LYMPHSABS 1.0 07/18/2015 1023   LYMPHSABS 2.4 11/16/2013 0912   MONOABS 0.4 07/18/2015 1023   EOSABS 0.1 07/18/2015 1023   EOSABS 0.2 11/16/2013 0912   BASOSABS 0.0 07/18/2015 1023   BASOSABS 0.0 11/16/2013 0912      Assessment/Plan:  1. AKI/CKD stage 3 (Scr 1-1.3) in setting of decompensated CHF, low EF and escalating diuretic use. Presumably due to intravascular volume depletion/ATN. Scr improving after gentle hydration. Cont to follow and avoid nephrotoxic agents such as NSAIDs/Cox-II I's, hold ACE/ARB/diuretics for now. 1. Scr and sodium improved even off of IVF's but worsened after one dose of po lasix 2. Hold lasix and follow. 3. Recheck FeNa 2. Acute on chronic systolic CHF- appears slightly volume overloaded on lung exam 1. S/p po lasix dose x 1 and follow exam and labs but hold any more lasix 3. HTN- stable  4. New A fib- with RVR. Plan per cardiology 1. For TEE and DC cardioversion on Monday 5. Hyperkalemia- due to #1. Resolved 6. Anemia- appears acute and work up underway but on hold due to new onset A fib and AKI. 1. Hgb was normal in 2015 but no more recent labs to compare. Possibly related to CKD but should not be so acute.  2. Low iron stores and s/p one dose of IV feraheme 3. consider starting EPO if this has been more chronic (will need outpt records) 7. Valvular heart disease- per Cardiology 8. DM- metformin on hold due to AKI. Plan per primary svc.  Marchetta Navratil A

## 2015-07-25 ENCOUNTER — Inpatient Hospital Stay (HOSPITAL_COMMUNITY): Payer: Medicare Other

## 2015-07-25 ENCOUNTER — Inpatient Hospital Stay (HOSPITAL_COMMUNITY): Payer: Medicare Other | Admitting: Certified Registered"

## 2015-07-25 ENCOUNTER — Encounter (HOSPITAL_COMMUNITY)
Admission: EM | Disposition: A | Discharge status: Home Health | Payer: Self-pay | Source: Home / Self Care | Attending: Cardiology

## 2015-07-25 ENCOUNTER — Ambulatory Visit: Payer: Medicare Other | Admitting: Cardiology

## 2015-07-25 ENCOUNTER — Encounter (HOSPITAL_COMMUNITY): Payer: Self-pay

## 2015-07-25 HISTORY — PX: CARDIOVERSION: SHX1299

## 2015-07-25 HISTORY — PX: TEE WITHOUT CARDIOVERSION: SHX5443

## 2015-07-25 LAB — GLUCOSE, CAPILLARY
GLUCOSE-CAPILLARY: 190 mg/dL — AB (ref 65–99)
Glucose-Capillary: 147 mg/dL — ABNORMAL HIGH (ref 65–99)
Glucose-Capillary: 135 mg/dL — ABNORMAL HIGH (ref 65–99)
Glucose-Capillary: 251 mg/dL — ABNORMAL HIGH (ref 65–99)

## 2015-07-25 LAB — BASIC METABOLIC PANEL
ANION GAP: 10 (ref 5–15)
BUN: 42 mg/dL — ABNORMAL HIGH (ref 6–20)
CALCIUM: 8.9 mg/dL (ref 8.9–10.3)
CO2: 22 mmol/L (ref 22–32)
Chloride: 98 mmol/L — ABNORMAL LOW (ref 101–111)
Creatinine, Ser: 2.12 mg/dL — ABNORMAL HIGH (ref 0.44–1.00)
GFR calc Af Amer: 24 mL/min — ABNORMAL LOW (ref >60)
GFR calc non Af Amer: 21 mL/min — ABNORMAL LOW (ref >60)
GLUCOSE: 143 mg/dL — AB (ref 65–99)
Potassium: 4.2 mmol/L (ref 3.5–5.1)
Sodium: 130 mmol/L — ABNORMAL LOW (ref 135–145)

## 2015-07-25 LAB — CBC
HEMATOCRIT: 27.8 % — AB (ref 36.0–46.0)
HEMOGLOBIN: 8.3 g/dL — AB (ref 12.0–15.0)
MCH: 23.6 pg — AB (ref 26.0–34.0)
MCHC: 29.9 g/dL — AB (ref 30.0–36.0)
MCV: 79.2 fL (ref 78.0–100.0)
PLATELETS: 207 10*3/uL (ref 150–400)
RBC: 3.51 MIL/uL — ABNORMAL LOW (ref 3.87–5.11)
RDW: 18.6 % — AB (ref 11.5–15.5)
WBC: 6.4 10*3/uL (ref 4.0–10.5)

## 2015-07-25 SURGERY — ECHOCARDIOGRAM, TRANSESOPHAGEAL
Anesthesia: General

## 2015-07-25 MED ORDER — OXYCODONE HCL 5 MG PO TABS
5.0000 mg | ORAL_TABLET | Freq: Once | ORAL | Status: AC | PRN
  Administered 2015-07-25: 5 mg via ORAL
  Filled 2015-07-25: qty 1

## 2015-07-25 MED ORDER — FENTANYL CITRATE (PF) 100 MCG/2ML IJ SOLN: 25.0000 ug | INTRAMUSCULAR | Status: DC | PRN

## 2015-07-25 MED ORDER — PROPOFOL 500 MG/50ML IV EMUL
INTRAVENOUS | Status: DC | PRN
  Administered 2015-07-25: 25 ug/kg/min via INTRAVENOUS

## 2015-07-25 MED ORDER — OXYCODONE HCL 5 MG/5ML PO SOLN: 5.0000 mg | Freq: Once | ORAL | Status: AC | PRN

## 2015-07-25 MED ORDER — ONDANSETRON HCL 4 MG/2ML IJ SOLN: 4.0000 mg | Freq: Once | INTRAMUSCULAR | Status: DC | PRN

## 2015-07-25 NOTE — Op Note (Signed)
LA, LAA without masses TV is normal  At least mod TR MV is normal  Mild MR AV is mildly thickened LVEF and RVEF are severely depressed   Full report to follow

## 2015-07-25 NOTE — Anesthesia Preprocedure Evaluation (Addendum)
Anesthesia Evaluation  Patient identified by MRN, date of birth, ID band Patient awake    Reviewed: Allergy & Precautions, NPO status , Patient's Chart, lab work & pertinent test results  Airway Mallampati: II   Neck ROM: Full    Dental  (+) Edentulous Upper, Edentulous Lower   Pulmonary shortness of breath and with exertion, COPD,    breath sounds clear to auscultation       Cardiovascular hypertension, Pt. on medications + CAD and +CHF  + dysrhythmias Atrial Fibrillation + pacemaker  Rhythm:Irregular Rate:Normal     Neuro/Psych PSYCHIATRIC DISORDERS    GI/Hepatic   Endo/Other  diabetes, Well Controlled, Oral Hypoglycemic Agents  Renal/GU Renal disease     Musculoskeletal   Abdominal (+) + obese,   Peds  Hematology  (+) anemia ,   Anesthesia Other Findings   Reproductive/Obstetrics                           Anesthesia Physical Anesthesia Plan  ASA: III  Anesthesia Plan: General   Post-op Pain Management:    Induction: Intravenous  Airway Management Planned: Natural Airway and Simple Face Mask  Additional Equipment:   Intra-op Plan:   Post-operative Plan:   Informed Consent: I have reviewed the patients History and Physical, chart, labs and discussed the procedure including the risks, benefits and alternatives for the proposed anesthesia with the patient or authorized representative who has indicated his/her understanding and acceptance.     Plan Discussed with: CRNA and Anesthesiologist  Anesthesia Plan Comments:         Anesthesia Quick Evaluation

## 2015-07-25 NOTE — Progress Notes (Signed)
Subjective:  No SOB, For TEE DCCV today at 11:00  Objective:  Temp:  [97.5 F (36.4 C)-98.2 F (36.8 C)] 97.9 F (36.6 C) (10/14 0624) Pulse Rate:  [74] 74 (10/13 1402) Resp:  [18-19] 18 (10/14 0624) BP: (79-112)/(59-79) 112/79 mmHg (10/14 0624) SpO2:  [94 %-100 %] 100 % (10/14 0624) Weight:  [181 lb 8 oz (82.328 kg)] 181 lb 8 oz (82.328 kg) (10/14 0624) Weight change: 1 lb 14.4 oz (0.862 kg)  Intake/Output from previous day: 10/13 0701 - 10/14 0700 In: 470.7 [P.O.:360; I.V.:110.7] Out: 275 [Urine:275]  Intake/Output from this shift: Total I/O In: 79.3 [I.V.:79.3] Out: -   Physical Exam: General appearance: alert and no distress Neck: no adenopathy, no carotid bruit, no JVD, supple, symmetrical, trachea midline and thyroid not enlarged, symmetric, no tenderness/mass/nodules Lungs: clear to auscultation bilaterally Heart: irregularly irregular rhythm Extremities: extremities normal, atraumatic, no cyanosis or edema  Lab Results: Results for orders placed or performed during the hospital encounter of 07/18/15 (from the past 48 hour(s))  Brain natriuretic peptide     Status: Abnormal   Collection Time: 07/23/15 10:10 AM  Result Value Ref Range   B Natriuretic Peptide 2816.9 (H) 0.0 - 100.0 pg/mL  Glucose, capillary     Status: Abnormal   Collection Time: 07/23/15 11:24 AM  Result Value Ref Range   Glucose-Capillary 204 (H) 65 - 99 mg/dL  Glucose, capillary     Status: Abnormal   Collection Time: 07/23/15  4:37 PM  Result Value Ref Range   Glucose-Capillary 162 (H) 65 - 99 mg/dL  Glucose, capillary     Status: Abnormal   Collection Time: 07/23/15 10:06 PM  Result Value Ref Range   Glucose-Capillary 206 (H) 65 - 99 mg/dL  Basic metabolic panel     Status: Abnormal   Collection Time: 07/24/15  3:53 AM  Result Value Ref Range   Sodium 130 (L) 135 - 145 mmol/L   Potassium 4.1 3.5 - 5.1 mmol/L   Chloride 98 (L) 101 - 111 mmol/L   CO2 23 22 - 32 mmol/L   Glucose, Bld 154 (H) 65 - 99 mg/dL   BUN 37 (H) 6 - 20 mg/dL   Creatinine, Ser 1.92 (H) 0.44 - 1.00 mg/dL   Calcium 9.0 8.9 - 10.3 mg/dL   GFR calc non Af Amer 23 (L) >60 mL/min   GFR calc Af Amer 27 (L) >60 mL/min    Comment: (NOTE) The eGFR has been calculated using the CKD EPI equation. This calculation has not been validated in all clinical situations. eGFR's persistently <60 mL/min signify possible Chronic Kidney Disease.    Anion gap 9 5 - 15  CBC     Status: Abnormal   Collection Time: 07/24/15  3:53 AM  Result Value Ref Range   WBC 5.9 4.0 - 10.5 K/uL   RBC 3.56 (L) 3.87 - 5.11 MIL/uL   Hemoglobin 8.4 (L) 12.0 - 15.0 g/dL   HCT 28.3 (L) 36.0 - 46.0 %   MCV 79.5 78.0 - 100.0 fL   MCH 23.6 (L) 26.0 - 34.0 pg   MCHC 29.7 (L) 30.0 - 36.0 g/dL   RDW 18.3 (H) 11.5 - 15.5 %   Platelets 203 150 - 400 K/uL  Glucose, capillary     Status: Abnormal   Collection Time: 07/24/15  8:14 AM  Result Value Ref Range   Glucose-Capillary 151 (H) 65 - 99 mg/dL  Glucose, capillary     Status: Abnormal  Collection Time: 07/24/15 12:13 PM  Result Value Ref Range   Glucose-Capillary 236 (H) 65 - 99 mg/dL   Comment 1 Notify RN    Comment 2 Document in Chart   Glucose, capillary     Status: Abnormal   Collection Time: 07/24/15  4:59 PM  Result Value Ref Range   Glucose-Capillary 195 (H) 65 - 99 mg/dL  Sodium, urine, random     Status: None   Collection Time: 07/24/15  6:54 PM  Result Value Ref Range   Sodium, Ur <10 mmol/L  Creatinine, urine, random     Status: None   Collection Time: 07/24/15  6:54 PM  Result Value Ref Range   Creatinine, Urine 195.91 mg/dL  Glucose, capillary     Status: Abnormal   Collection Time: 07/24/15  9:10 PM  Result Value Ref Range   Glucose-Capillary 182 (H) 65 - 99 mg/dL  Basic metabolic panel     Status: Abnormal   Collection Time: 07/25/15  3:46 AM  Result Value Ref Range   Sodium 130 (L) 135 - 145 mmol/L   Potassium 4.2 3.5 - 5.1 mmol/L   Chloride  98 (L) 101 - 111 mmol/L   CO2 22 22 - 32 mmol/L   Glucose, Bld 143 (H) 65 - 99 mg/dL   BUN 42 (H) 6 - 20 mg/dL   Creatinine, Ser 2.12 (H) 0.44 - 1.00 mg/dL   Calcium 8.9 8.9 - 10.3 mg/dL   GFR calc non Af Amer 21 (L) >60 mL/min   GFR calc Af Amer 24 (L) >60 mL/min    Comment: (NOTE) The eGFR has been calculated using the CKD EPI equation. This calculation has not been validated in all clinical situations. eGFR's persistently <60 mL/min signify possible Chronic Kidney Disease.    Anion gap 10 5 - 15  CBC     Status: Abnormal   Collection Time: 07/25/15  3:46 AM  Result Value Ref Range   WBC 6.4 4.0 - 10.5 K/uL   RBC 3.51 (L) 3.87 - 5.11 MIL/uL   Hemoglobin 8.3 (L) 12.0 - 15.0 g/dL   HCT 27.8 (L) 36.0 - 46.0 %   MCV 79.2 78.0 - 100.0 fL   MCH 23.6 (L) 26.0 - 34.0 pg   MCHC 29.9 (L) 30.0 - 36.0 g/dL   RDW 18.6 (H) 11.5 - 15.5 %   Platelets 207 150 - 400 K/uL  Glucose, capillary     Status: Abnormal   Collection Time: 07/25/15  7:47 AM  Result Value Ref Range   Glucose-Capillary 147 (H) 65 - 99 mg/dL    Imaging: Imaging results have been reviewed  Tele- Afib with demand VVI pacing Assessment/Plan:   1. Active Problems: 2.   Mild CAD - 20-30% RCA in 2013 3.   Mild aortic stenosis 4.   Essential hypertension 5.   Symptomatic anemia 6.   Atrial fibrillation - developed 07/11/15 by PPM interrogation 7.   Moderate mitral regurgitation 8.   Moderate tricuspid regurgitation 9.   Chronic systolic CHF (congestive heart failure) ([place]) 10.   History of pacemaker - Medtronic 11.   Microcytic anemia 12.   Acute renal failure superimposed on stage 3 chronic kidney disease ([place]) 13.   Diabetes mellitus with nephropathy ([place]) 14.   Time Spent Directly with Patient:  20 minutes  Length of Stay:  LOS: 7 days   Pt has received 5 total doses of Eliquis so far. Remains in Afib with VR about 100, demand VVI pacing.  Scr has risen to 2.12 (diuretics on hold). I/O + 1.2 L. HGB stable  as well.Lungs clear but JVP elevated. For TEE DCCV this AM.   Quay Burow 07/25/2015, 9:18 AM

## 2015-07-25 NOTE — Interval H&P Note (Signed)
History and Physical Interval Note:  07/25/2015 8:22 AM  Danielle Benitez  has presented today for surgery, with the diagnosis of A FIB   The various methods of treatment have been discussed with the patient and family. After consideration of risks, benefits and other options for treatment, the patient has consented to  Procedure(s): TRANSESOPHAGEAL ECHOCARDIOGRAM (TEE) (N/A) CARDIOVERSION (N/A) as a surgical intervention .  The patient's history has been reviewed, patient examined, no change in status, stable for surgery.  I have reviewed the patient's chart and labs.  Questions were answered to the patient's satisfaction.     Dietrich Pates

## 2015-07-25 NOTE — Progress Notes (Signed)
PT Cancellation Note  Patient Details Name: KORALYNN SUER MRN: [id number] DOB: 1932/05/31   Cancelled Treatment:    Reason Eval/Treat Not Completed: Patient at procedure or test/unavailable (for TEE).  PT will continue to follow acutely and will complete therapy treatment session as time permits.    Michail Jewels PT, DPT [phone] Pager: [phone] 07/25/2015, 10:36 AM

## 2015-07-25 NOTE — H&P (View-Only) (Signed)
Subjective:  Denies CP/SOB  Objective:  Temp:  [98.2 F (36.8 C)-98.4 F (36.9 C)] 98.2 F (36.8 C) (10/13 0500) Pulse Rate:  [100] 100 (10/13 0500) Resp:  [16-20] 16 (10/13 0500) BP: (95-115)/(54-68) 95/54 mmHg (10/13 0500) SpO2:  [96 %-100 %] 96 % (10/13 0500) Weight:  [179 lb 9.6 oz (81.466 kg)] 179 lb 9.6 oz (81.466 kg) (10/13 0600) Weight change: 6 lb 4.4 oz (2.846 kg)  Intake/Output from previous day: 10/12 0701 - 10/13 0700 In: 300 [P.O.:300] Out: -   Intake/Output from this shift:    Physical Exam: General appearance: alert and no distress Neck: no adenopathy, no carotid bruit, no JVD, supple, symmetrical, trachea midline and thyroid not enlarged, symmetric, no tenderness/mass/nodules Lungs: scattered basilar crackles Heart: irregularly irregular rhythm Extremities: extremities normal, atraumatic, no cyanosis or edema  Lab Results: Results for orders placed or performed during the hospital encounter of 07/18/15 (from the past 48 hour(s))  Glucose, capillary     Status: Abnormal   Collection Time: 07/22/15 11:26 AM  Result Value Ref Range   Glucose-Capillary 190 (H) 65 - 99 mg/dL  Glucose, capillary     Status: Abnormal   Collection Time: 07/22/15  4:30 PM  Result Value Ref Range   Glucose-Capillary 221 (H) 65 - 99 mg/dL  TSH     Status: None   Collection Time: 07/22/15  5:06 PM  Result Value Ref Range   TSH 1.260 0.350 - 4.500 uIU/mL  Basic metabolic panel     Status: Abnormal   Collection Time: 07/23/15  5:37 AM  Result Value Ref Range   Sodium 134 (L) 135 - 145 mmol/L   Potassium 4.0 3.5 - 5.1 mmol/L   Chloride 100 (L) 101 - 111 mmol/L   CO2 21 (L) 22 - 32 mmol/L   Glucose, Bld 190 (H) 65 - 99 mg/dL   BUN 39 (H) 6 - 20 mg/dL   Creatinine, Ser 1.72 (H) 0.44 - 1.00 mg/dL   Calcium 9.2 8.9 - 10.3 mg/dL   GFR calc non Af Amer 27 (L) >60 mL/min   GFR calc Af Amer 31 (L) >60 mL/min    Comment: (NOTE) The eGFR has been calculated using the CKD EPI  equation. This calculation has not been validated in all clinical situations. eGFR's persistently <60 mL/min signify possible Chronic Kidney Disease.    Anion gap 13 5 - 15  CBC     Status: Abnormal   Collection Time: 07/23/15  5:37 AM  Result Value Ref Range   WBC 5.2 4.0 - 10.5 K/uL   RBC 3.39 (L) 3.87 - 5.11 MIL/uL   Hemoglobin 8.1 (L) 12.0 - 15.0 g/dL   HCT 26.8 (L) 36.0 - 46.0 %   MCV 79.1 78.0 - 100.0 fL   MCH 23.9 (L) 26.0 - 34.0 pg   MCHC 30.2 30.0 - 36.0 g/dL   RDW 18.3 (H) 11.5 - 15.5 %   Platelets 159 150 - 400 K/uL  Glucose, capillary     Status: Abnormal   Collection Time: 07/23/15  7:41 AM  Result Value Ref Range   Glucose-Capillary 180 (H) 65 - 99 mg/dL  Brain natriuretic peptide     Status: Abnormal   Collection Time: 07/23/15 10:10 AM  Result Value Ref Range   B Natriuretic Peptide 2816.9 (H) 0.0 - 100.0 pg/mL  Glucose, capillary     Status: Abnormal   Collection Time: 07/23/15 11:24 AM  Result Value Ref Range   Glucose-Capillary  204 (H) 65 - 99 mg/dL  Glucose, capillary     Status: Abnormal   Collection Time: 07/23/15  4:37 PM  Result Value Ref Range   Glucose-Capillary 162 (H) 65 - 99 mg/dL  Glucose, capillary     Status: Abnormal   Collection Time: 07/23/15 10:06 PM  Result Value Ref Range   Glucose-Capillary 206 (H) 65 - 99 mg/dL  Basic metabolic panel     Status: Abnormal   Collection Time: 07/24/15  3:53 AM  Result Value Ref Range   Sodium 130 (L) 135 - 145 mmol/L   Potassium 4.1 3.5 - 5.1 mmol/L   Chloride 98 (L) 101 - 111 mmol/L   CO2 23 22 - 32 mmol/L   Glucose, Bld 154 (H) 65 - 99 mg/dL   BUN 37 (H) 6 - 20 mg/dL   Creatinine, Ser 1.92 (H) 0.44 - 1.00 mg/dL   Calcium 9.0 8.9 - 10.3 mg/dL   GFR calc non Af Amer 23 (L) >60 mL/min   GFR calc Af Amer 27 (L) >60 mL/min    Comment: (NOTE) The eGFR has been calculated using the CKD EPI equation. This calculation has not been validated in all clinical situations. eGFR's persistently <60 mL/min  signify possible Chronic Kidney Disease.    Anion gap 9 5 - 15  CBC     Status: Abnormal   Collection Time: 07/24/15  3:53 AM  Result Value Ref Range   WBC 5.9 4.0 - 10.5 K/uL   RBC 3.56 (L) 3.87 - 5.11 MIL/uL   Hemoglobin 8.4 (L) 12.0 - 15.0 g/dL   HCT 28.3 (L) 36.0 - 46.0 %   MCV 79.5 78.0 - 100.0 fL   MCH 23.6 (L) 26.0 - 34.0 pg   MCHC 29.7 (L) 30.0 - 36.0 g/dL   RDW 18.3 (H) 11.5 - 15.5 %   Platelets 203 150 - 400 K/uL  Glucose, capillary     Status: Abnormal   Collection Time: 07/24/15  8:14 AM  Result Value Ref Range   Glucose-Capillary 151 (H) 65 - 99 mg/dL    Imaging: Imaging results have been reviewed  Tele- Afib with VR 90-110, intermittent demand pacing  Assessment/Plan:   1. Active Problems: 2.   Mild CAD - 20-30% RCA in 2013 3.   Mild aortic stenosis 4.   Essential hypertension 5.   Symptomatic anemia 6.   Atrial fibrillation - developed 07/11/15 by PPM interrogation 7.   Moderate mitral regurgitation 8.   Moderate tricuspid regurgitation 9.   Chronic systolic CHF (congestive heart failure) ([place]) 10.   History of pacemaker - Medtronic 11.   Microcytic anemia 12.   Acute renal failure superimposed on stage 3 chronic kidney disease ([place]) 13.   Diabetes mellitus with nephropathy ([place]) 14.   Time Spent Directly with Patient:  20 minutes  Length of Stay:  LOS: 6 days   Eliquis 2.5 mg PO BIB started yesterday. Afib rate slightly better controlled on metop. SCr increased 1.72---> 1.92 today after lasix 40 mg PO given yesterday. Exam benign with scattered basilar crackles. Plan TEE DCCV Monday. Hopefully restoration of NSR will improve CO and renal fxn.   Quay Burow 07/24/2015, 10:20 AM

## 2015-07-25 NOTE — Progress Notes (Signed)
Patient ID: Danielle Benitez, female   DOB: Mar 10, 1932, 81 y.o.   MRN: [id number] S:s/p cardioversion today and feels better O:BP 112/79 mmHg  Pulse 74  Temp(Src) 97.9 F (36.6 C) (Oral)  Resp 18  Ht  (1.626 m)  Wt 82.328 kg (181 lb 8 oz)  BMI 31.14 kg/m2  SpO2 100%  Intake/Output Summary (Last 24 hours) at 07/25/15 0914 Last data filed at 07/25/15 0800  Gross per 24 hour  Intake    550 ml  Output    275 ml  Net    275 ml   Intake/Output: I/O last 3 completed shifts: In: 770.7 [P.O.:660; I.V.:110.7] Out: 275 [Urine:275]  Intake/Output this shift:  Total I/O In: 79.3 [I.V.:79.3] Out: -  Weight change: 0.862 kg (1 lb 14.4 oz) Gen:WD WN elderly WF in NAd CVS:RRR no rub Resp:cta WUJ:WJXBJY Ext:no edema   Recent Labs Lab 07/18/15 1023 07/19/15 0500 07/20/15 0439 07/21/15 0326 07/22/15 0438 07/23/15 0537 07/24/15 0353 07/25/15 0346  NA 136 135 131* 134* 135 134* 130* 130*  K 5.1 5.5* 5.6* 4.5 4.0 4.0 4.1 4.2  CL 101 100* 98* 101 102 100* 98* 98*  CO2 25 23 20* 22 23 21* 23 22  GLUCOSE 133* 145* 136* 164* 142* 190* 154* 143*  BUN 32* 40* 49* 51* 45* 39* 37* 42*  CREATININE 1.63* 2.44* 3.41* 2.94* 2.25* 1.72* 1.92* 2.12*  ALBUMIN 3.4*  --   --   --   --   --   --   --   CALCIUM 9.2 9.2 8.9 8.6* 9.1 9.2 9.0 8.9  AST 25  --   --   --   --   --   --   --   ALT 11*  --   --   --   --   --   --   --    Liver Function Tests:  Recent Labs Lab 07/18/15 1023  AST 25  ALT 11*  ALKPHOS 91  BILITOT 0.4  PROT 6.6  ALBUMIN 3.4*   No results for input(s): LIPASE, AMYLASE in the last 168 hours. No results for input(s): AMMONIA in the last 168 hours. CBC:  Recent Labs Lab 07/18/15 1023  07/21/15 0326 07/22/15 0438 07/23/15 0537 07/24/15 0353 07/25/15 0346  WBC 6.7  < > 5.9 6.8 5.2 5.9 6.4  NEUTROABS 5.1  --   --   --   --   --   --   HGB 8.4*  < > 8.1* 8.2* 8.1* 8.4* 8.3*  HCT 28.2*  < > 27.5* 27.1* 26.8* 28.3* 27.8*  MCV 78.8  < > 78.8 77.7* 79.1 79.5 79.2   PLT 178  < > 179 165 159 203 207  < > = values in this interval not displayed. Cardiac Enzymes:  Recent Labs Lab 07/18/15 1800 07/18/15 2240 07/19/15 0500  TROPONINI 0.03 0.04* 0.04*   CBG:  Recent Labs Lab 07/24/15 0814 07/24/15 1213 07/24/15 1659 07/24/15 2110 07/25/15 0747  GLUCAP 151* 236* 195* 182* 147*    Iron Studies: No results for input(s): IRON, TIBC, TRANSFERRIN, FERRITIN in the last 72 hours. Studies/Results: No results found. Marland Kitchen allopurinol  200 mg Oral Daily  . apixaban  2.5 mg Oral BID  . atorvastatin  20 mg Oral q1800  . DULoxetine  20 mg Oral Daily  . insulin aspart  0-9 Units Subcutaneous TID WC  . metoprolol tartrate  25 mg Oral BID  . montelukast  10 mg Oral QHS  .  pantoprazole  40 mg Oral BID  . sodium chloride  3 mL Intravenous Q12H  . sodium chloride  3 mL Intravenous Q12H    BMET    Component Value Date/Time   NA 130* 07/25/2015 0346   NA 140 04/28/2015 0857   K 4.2 07/25/2015 0346   CL 98* 07/25/2015 0346   CO2 22 07/25/2015 0346   GLUCOSE 143* 07/25/2015 0346   GLUCOSE 101* 04/28/2015 0857   BUN 42* 07/25/2015 0346   BUN 18 04/28/2015 0857   CREATININE 2.12* 07/25/2015 0346   CREATININE 1.75* 07/17/2015 1631   CALCIUM 8.9 07/25/2015 0346   GFRNONAA 21* 07/25/2015 0346   GFRAA 24* 07/25/2015 0346   CBC    Component Value Date/Time   WBC 6.4 07/25/2015 0346   WBC 6.0 11/16/2013 0912   RBC 3.51* 07/25/2015 0346   RBC 3.50* 07/18/2015 1800   RBC 3.85 11/16/2013 0912   HGB 8.3* 07/25/2015 0346   HCT 27.8* 07/25/2015 0346   PLT 207 07/25/2015 0346   MCV 79.2 07/25/2015 0346   MCH 23.6* 07/25/2015 0346   MCH 29.4 11/16/2013 0912   MCHC 29.9* 07/25/2015 0346   MCHC 32.4 11/16/2013 0912   RDW 18.6* 07/25/2015 0346   RDW 16.1* 11/16/2013 0912   LYMPHSABS 1.0 07/18/2015 1023   LYMPHSABS 2.4 11/16/2013 0912   MONOABS 0.4 07/18/2015 1023   EOSABS 0.1 07/18/2015 1023   EOSABS 0.2 11/16/2013 0912   BASOSABS 0.0 07/18/2015 1023    BASOSABS 0.0 11/16/2013 0912     Assessment/Plan:  1. AKI/CKD stage 3 (Scr 1-1.3) in setting of decompensated CHF, low EF and escalating diuretic use. Presumably due to intravascular volume depletion/ATN. Scr improving after gentle hydration. Cont to follow and avoid nephrotoxic agents such as NSAIDs/Cox-II I's, hold ACE/ARB/diuretics for now. 1. Scr and sodium improved even off of IVF's but worsened after one dose of po lasix 2. Hold lasix and follow. 3. Recheck renal panel in am, hopefully with sinus rhythm she will have better renal perfusion. 4. Hold off discharge until we see improvement of her Scr  2. Acute on chronic systolic CHF- appears slightly volume overloaded on lung exam 1. hold any more lasix 3. HTN- stable  4. New A fib- with RVR. Plan per cardiology 1. S/p TEE and DC cardioversion today  5. Hyperkalemia- due to #1. Resolved 6. Anemia- appears acute and work up underway but on hold due to new onset A fib and AKI. 1. Hgb was normal in 2015 but no more recent labs to compare. Possibly related to CKD but should not be so acute.  2. Low iron stores and s/p one dose of IV feraheme 3. consider starting EPO if this has been more chronic (will need outpt records) 7. Valvular heart disease- per Cardiology 8. DM- metformin on hold due to AKI. Plan per primary svc.  Markelle Asaro A

## 2015-07-25 NOTE — Op Note (Signed)
Patient anesthetized by anesthesia with propofol With pads in AP position, pt cardioverted to SR with 200 J synchronized biphasic energy  Pacer interrogated. Procedure without complications.

## 2015-07-25 NOTE — Progress Notes (Signed)
  Echocardiogram Echocardiogram Transesophageal With Cardioversion has been performed.  Leta Jungling M 07/25/2015, 11:51 AM

## 2015-07-25 NOTE — Anesthesia Postprocedure Evaluation (Signed)
  Anesthesia Post-op Note  Patient: Danielle Benitez  Procedure(s) Performed: Procedure(s): TRANSESOPHAGEAL ECHOCARDIOGRAM (TEE) (N/A) CARDIOVERSION (N/A)  Patient Location: PACU  Anesthesia Type:General  Level of Consciousness: awake, alert  and oriented  Airway and Oxygen Therapy: Patient Spontanous Breathing and Patient connected to nasal cannula oxygen  Post-op Pain: mild  Post-op Assessment: Post-op Vital signs reviewed, Patient's Cardiovascular Status Stable, Respiratory Function Stable, Patent Airway and Pain level controlled              Post-op Vital Signs: stable  Last Vitals:  Filed Vitals:   07/25/15 1229  BP: 101/66  Pulse:   Temp: 36.7 C  Resp:     Complications: No apparent anesthesia complications

## 2015-07-25 NOTE — Progress Notes (Signed)
Physical Therapy Treatment Patient Details Name: Danielle Benitez MRN: [id number] DOB: 1931-11-16 Today's Date: 07/25/2015    History of Present Illness Danielle Benitez is a 81 y.o. female with history of HTN, orthostatic hypotension, DM, mild aortic stenosis, CKD stage III, symptomatic bradycardia s/p Medtronic PPM, RBBB/LAFB, dyslipidemia, nonobstructive CAD in 2013 (20-30% prox RCA), and diagnosis of LV dysfunction/afib who presented to the ER for worsening hemoglobin level.     PT Comments    Danielle Benitez required min assist managing RW during ambulation 2/2 trunk flexion but ambulated 80 ft.  She c/o low back pain (which is Danielle baseline) during ambulation, requiring several standing rest breaks for this reason.  She remains very motivated to be as strong as she can so she can return home.   Follow Up Recommendations  Home health PT;Supervision - Intermittent     Equipment Recommendations  None recommended by PT    Recommendations for Other Services       Precautions / Restrictions Precautions Precautions: Fall Restrictions Weight Bearing Restrictions: No    Mobility  Bed Mobility Overal bed mobility: Needs Assistance Bed Mobility: Supine to Sit     Supine to sit: Min assist     General bed mobility comments: 1 person HHA and use of bed rail to pull up to sitting w/ HOB elevated.    Transfers Overall transfer level: Needs assistance Equipment used: Rolling walker (2 wheeled) Transfers: Sit to/from Stand Sit to Stand: Supervision         General transfer comment: Able to stand without physical assistance.  Cues for hand placement as pt ininitially has Bil hands on RW prior to standing  Ambulation/Gait Ambulation/Gait assistance: Min assist Ambulation Distance (Feet): 80 Feet Assistive device: Rolling walker (2 wheeled) Gait Pattern/deviations: Step-through pattern;Antalgic;Trunk flexed;Decreased stride length   Gait velocity interpretation: Below normal speed for  age/gender General Gait Details: Pt continues to demonstrate trunk flexion which improves w/ verbal cues and min assist for proper positioning.  Pt c/o low back pain while ambulating (which is Danielle baseline) that causes Danielle to require several standing rest breaks.   Stairs            Wheelchair Mobility    Modified Rankin (Stroke Patients Only)       Balance Overall balance assessment: Needs assistance Sitting-balance support: Feet supported Sitting balance-Leahy Scale: Good     Standing balance support: Bilateral upper extremity supported;During functional activity Standing balance-Leahy Scale: Fair                      Cognition Arousal/Alertness: Awake/alert Behavior During Therapy: WFL for tasks assessed/performed Overall Cognitive Status: Within Functional Limits for tasks assessed                      Exercises General Exercises - Lower Extremity Ankle Circles/Pumps: AROM;Both;10 reps;Seated Long Arc Quad: AROM;Both;15 reps;Seated Hip Flexion/Marching: AROM;Both;Seated;15 reps    General Comments General comments (skin integrity, edema, etc.): Pt remains very motivated to return home and to get as strong as she can.      Pertinent Vitals/Pain Pain Assessment: Faces Faces Pain Scale: Hurts little more Pain Location: low back w/ ambulation Pain Descriptors / Indicators: Aching;Grimacing Pain Intervention(s): Limited activity within patient's tolerance;Monitored during session;Repositioned    Home Living                      Prior Function  PT Goals (current goals can now be found in the care plan section) Acute Rehab PT Goals Patient Stated Goal: to get stronger so she can go back to spending time w/ Danielle Benitez PT Goal Formulation: With patient Time For Goal Achievement: 08/05/15 Potential to Achieve Goals: Good Progress towards PT goals: Progressing toward goals    Frequency  Min 3X/week    PT Plan Current  plan remains appropriate    Co-evaluation             End of Session Equipment Utilized During Treatment: Gait belt Activity Tolerance: Patient limited by fatigue;Patient limited by pain Patient left: in chair;with call bell/phone within reach;with family/visitor present     Time: 1540-1556 PT Time Calculation (min) (ACUTE ONLY): 16 min  Charges:  $Gait Training: 8-22 mins                    G Codes:      Michail Jewels PT, [place] [phone] Pager: [phone] 07/25/2015, 4:05 PM

## 2015-07-25 NOTE — Transfer of Care (Signed)
Immediate Anesthesia Transfer of Care Note  Patient: Danielle Benitez  Procedure(s) Performed: Procedure(s): TRANSESOPHAGEAL ECHOCARDIOGRAM (TEE) (N/A) CARDIOVERSION (N/A)  Patient Location: Endoscopy Unit  Anesthesia Type:MAC  Level of Consciousness: sedated  Airway & Oxygen Therapy: Patient connected to nasal cannula oxygen  Post-op Assessment: Report given to RN  Post vital signs: stable  Last Vitals:  Filed Vitals:   07/25/15 1021  BP: 102/71  Pulse: 90  Temp: 36.7 C  Resp: 14    Complications: No apparent anesthesia complications

## 2015-07-26 LAB — GLUCOSE, CAPILLARY
GLUCOSE-CAPILLARY: 164 mg/dL — AB (ref 65–99)
GLUCOSE-CAPILLARY: 236 mg/dL — AB (ref 65–99)
GLUCOSE-CAPILLARY: 188 mg/dL — AB (ref 65–99)
Glucose-Capillary: 192 mg/dL — ABNORMAL HIGH (ref 65–99)

## 2015-07-26 LAB — RENAL FUNCTION PANEL
ANION GAP: 10 (ref 5–15)
Albumin: 3.5 g/dL (ref 3.5–5.0)
BUN: 48 mg/dL — ABNORMAL HIGH (ref 6–20)
CO2: 22 mmol/L (ref 22–32)
Calcium: 9.4 mg/dL (ref 8.9–10.3)
Chloride: 101 mmol/L (ref 101–111)
Creatinine, Ser: 2.33 mg/dL — ABNORMAL HIGH (ref 0.44–1.00)
GFR calc Af Amer: 21 mL/min — ABNORMAL LOW (ref >60)
GFR calc non Af Amer: 18 mL/min — ABNORMAL LOW (ref >60)
GLUCOSE: 170 mg/dL — AB (ref 65–99)
PHOSPHORUS: 5.1 mg/dL — AB (ref 2.5–4.6)
POTASSIUM: 4.7 mmol/L (ref 3.5–5.1)
Sodium: 133 mmol/L — ABNORMAL LOW (ref 135–145)

## 2015-07-26 LAB — CBC
HEMATOCRIT: 31.4 % — AB (ref 36.0–46.0)
HEMOGLOBIN: 9.3 g/dL — AB (ref 12.0–15.0)
MCH: 23.7 pg — AB (ref 26.0–34.0)
MCHC: 29.6 g/dL — AB (ref 30.0–36.0)
MCV: 80.1 fL (ref 78.0–100.0)
Platelets: 237 10*3/uL (ref 150–400)
RBC: 3.92 MIL/uL (ref 3.87–5.11)
RDW: 19.1 % — ABNORMAL HIGH (ref 11.5–15.5)
WBC: 7.3 10*3/uL (ref 4.0–10.5)

## 2015-07-26 MED ORDER — TRAZODONE HCL 50 MG PO TABS
25.0000 mg | ORAL_TABLET | Freq: Every evening | ORAL | Status: AC | PRN
  Administered 2015-07-26: 25 mg via ORAL
  Filled 2015-07-26: qty 1

## 2015-07-26 NOTE — Progress Notes (Signed)
Patient Name: Danielle Benitez Date of Encounter: 07/26/2015     Active Problems:   Mild CAD - 20-30% RCA in 2013   Mild aortic stenosis   Essential hypertension   Symptomatic anemia   Atrial fibrillation - developed 07/11/15 by PPM interrogation   Moderate mitral regurgitation   Moderate tricuspid regurgitation   Chronic systolic CHF (congestive heart failure) ([facility])   History of pacemaker - Medtronic   Microcytic anemia   Acute renal failure superimposed on stage 3 chronic kidney disease ([facility])   Diabetes mellitus with nephropathy ([facility])    SUBJECTIVE  The patient is less short of breath.  She is maintaining normal sinus rhythm following cardioversion yesterday.  Her renal failure has deteriorated slightly and nephrology is seeing and recommends that we observe her here in the hospital a while longer to be sure that her kidneys are functioning adequately.  CURRENT MEDS . allopurinol  200 mg Oral Daily  . apixaban  2.5 mg Oral BID  . atorvastatin  20 mg Oral q1800  . DULoxetine  20 mg Oral Daily  . insulin aspart  0-9 Units Subcutaneous TID WC  . metoprolol tartrate  25 mg Oral BID  . montelukast  10 mg Oral QHS  . pantoprazole  40 mg Oral BID  . sodium chloride  3 mL Intravenous Q12H  . sodium chloride  3 mL Intravenous Q12H    OBJECTIVE  Filed Vitals:   07/25/15 2035 07/26/15 0028 07/26/15 0517 07/26/15 0813  BP: 108/68 119/77 122/76 120/70  Pulse: 69 70 70 76  Temp: 98.8 F (37.1 C) 98.8 F (37.1 C) 97.8 F (36.6 C)   TempSrc: Oral Oral Oral   Resp:      Height:      Weight:   187 lb 2.7 oz (84.9 kg)   SpO2: 93% 100% 92%     Intake/Output Summary (Last 24 hours) at 07/26/15 1353 Last data filed at 07/25/15 1800  Gross per 24 hour  Intake    480 ml  Output      0 ml  Net    480 ml   Filed Weights   07/24/15 0600 07/25/15 0624 07/26/15 0517  Weight: 179 lb 9.6 oz (81.466 kg) 181 lb 8 oz (82.328 kg) 187 lb 2.7 oz (84.9 kg)    PHYSICAL EXAM  General:  Pleasant, NAD. Neuro: Alert and oriented X 3. Moves all extremities spontaneously. Psych: Normal affect. HEENT:  Normal  Neck: Supple without bruits or JVD. Lungs:  Resp regular and unlabored, CTA. Heart: RRR no s3, s4, or murmurs.  No pericardial rub Abdomen: Soft, non-tender, non-distended, BS + x 4.  Extremities: No clubbing, cyanosis or edema. DP/PT/Radials 2+ and equal bilaterally.  Accessory Clinical Findings  CBC  Recent Labs  07/25/15 0346 07/26/15 0316  WBC 6.4 7.3  HGB 8.3* 9.3*  HCT 27.8* 31.4*  MCV 79.2 80.1  PLT 207 237   Basic Metabolic Panel  Recent Labs  07/25/15 0346 07/26/15 0316  NA 130* 133*  K 4.2 4.7  CL 98* 101  CO2 22 22  GLUCOSE 143* 170*  BUN 42* 48*  CREATININE 2.12* 2.33*  CALCIUM 8.9 9.4  PHOS  --  5.1*   Liver Function Tests  Recent Labs  07/26/15 0316  ALBUMIN 3.5   No results for input(s): LIPASE, AMYLASE in the last 72 hours. Cardiac Enzymes No results for input(s): CKTOTAL, CKMB, CKMBINDEX, TROPONINI in the last 72 hours. BNP Invalid input(s): POCBNP D-Dimer No  results for input(s): DDIMER in the last 72 hours. Hemoglobin A1C No results for input(s): HGBA1C in the last 72 hours. Fasting Lipid Panel No results for input(s): CHOL, HDL, LDLCALC, TRIG, CHOLHDL, LDLDIRECT in the last 72 hours. Thyroid Function Tests No results for input(s): TSH, T4TOTAL, T3FREE, THYROIDAB in the last 72 hours.  Invalid input(s): FREET3  TELE  Atrial paced ventricular sensed  ECG    Radiology/Studies  Dg Chest 2 View  07/18/2015  CLINICAL DATA:  Worsening shortness of breath EXAM: CHEST  2 VIEW COMPARISON:  07/11/2015 FINDINGS: There is mild bilateral interstitial thickening. There is no focal parenchymal opacity. There is no pleural effusion or pneumothorax. There is stable cardiomegaly. There is a dual lead cardiac pacemaker. There is severe osteoarthritis of the left glenohumeral joint. IMPRESSION: 1. Stable cardiomegaly with  mild pulmonary vascular congestion. Electronically Signed   By: Elige Ko   On: 07/18/2015 11:08   Dg Chest 2 View  07/11/2015  CLINICAL DATA:  COPD. Bronchitis. Cough. Wheezing. Shortness of breath. EXAM: CHEST  2 VIEW COMPARISON:  01/11/2012 chest radiograph . FINDINGS: Stable configuration of dual lead left subclavian pacemaker with lead tips overlying the right atrium and right ventricle. Moderate enlargement of the cardiac silhouette, which appears increased in the interval. Otherwise stable mediastinal contour with mild tortuosity of the thoracic aorta. No pneumothorax. No pleural effusion. No focal lung consolidation. There is cephalization of the pulmonary vasculature without overt pulmonary edema. Moderate degenerative changes in the visualized thoracolumbar spine. IMPRESSION: 1. Worsening moderate enlargement of the cardiac silhouette, cannot exclude pericardial effusion. 2. Cephalization of the pulmonary vasculature without overt pulmonary edema. Electronically Signed   By: Delbert Phenix M.D.   On: 07/11/2015 16:53   US Renal  07/21/2015  CLINICAL DATA:  Acute renal failure EXAM: RENAL / URINARY TRACT ULTRASOUND COMPLETE COMPARISON:  None. FINDINGS: Right Kidney: Length: 10.3 cm. Echogenicity within normal limits. No mass or hydronephrosis visualized. Left Kidney: Length: 10.6 cm. Mildly increased echotexture. No mass or hydronephrosis. Bladder: Appears normal for degree of bladder distention. IMPRESSION: No hydronephrosis or acute findings. Mildly increased echotexture on the left. Electronically Signed   By: Charlett Nose M.D.   On: 07/21/2015 11:11    ASSESSMENT AND PLAN 1.  Paroxysmal atrial fibrillation, now back in normal sinus rhythm following TEE/DCCV yesterday.  On long-term Apixaban 2.  Acute on chronic kidney disease stage III--nephrology following 3.  Medtronic dual-chamber pacemaker 4.  Diabetes mellitus with nephropathy 5.  Valvular heart disease with mild aortic stenosis  moderate mitral regurgitation and moderate tricuspid regurgitation and chronic systolic heart failure  Plan: Lasix on hold.  Daily assessment of renal function.  Hold in hospital until we see clear improvement.  Signed, Cassell Clement MD

## 2015-07-26 NOTE — Progress Notes (Signed)
Patient ID: Danielle Benitez, female   DOB: 1932-06-25, 81 y.o.   MRN: [id number] S:feels better O:BP 120/70 mmHg  Pulse 76  Temp(Src) 97.8 F (36.6 C) (Oral)  Resp 10  Ht 5\' 4"  (1.626 m)  Wt 84.9 kg (187 lb 2.7 oz)  BMI 32.11 kg/m2  SpO2 92%  Intake/Output Summary (Last 24 hours) at 07/26/15 0844 Last data filed at 07/25/15 1800  Gross per 24 hour  Intake    480 ml  Output      0 ml  Net    480 ml   Intake/Output: I/O last 3 completed shifts: In: 790 [P.O.:600; I.V.:190] Out: 275 [Urine:275]  Intake/Output this shift:    Weight change: 2.572 kg (5 lb 10.7 oz) JQZ:ESPQZ elderly wf in nad RAQ:TMAUQJF rate and rhythm, no rub Resp:cta HLK:TGYBWL Ext:no edema   Recent Labs Lab 07/20/15 0439 07/21/15 0326 07/22/15 0438 07/23/15 0537 07/24/15 0353 07/25/15 0346 07/26/15 0316  NA 131* 134* 135 134* 130* 130* 133*  K 5.6* 4.5 4.0 4.0 4.1 4.2 4.7  CL 98* 101 102 100* 98* 98* 101  CO2 20* 22 23 21* 23 22 22   GLUCOSE 136* 164* 142* 190* 154* 143* 170*  BUN 49* 51* 45* 39* 37* 42* 48*  CREATININE 3.41* 2.94* 2.25* 1.72* 1.92* 2.12* 2.33*  ALBUMIN  --   --   --   --   --   --  3.5  CALCIUM 8.9 8.6* 9.1 9.2 9.0 8.9 9.4  PHOS  --   --   --   --   --   --  5.1*   Liver Function Tests:  Recent Labs Lab 07/26/15 0316  ALBUMIN 3.5   No results for input(s): LIPASE, AMYLASE in the last 168 hours. No results for input(s): AMMONIA in the last 168 hours. CBC:  Recent Labs Lab 07/22/15 0438 07/23/15 0537 07/24/15 0353 07/25/15 0346 07/26/15 0316  WBC 6.8 5.2 5.9 6.4 7.3  HGB 8.2* 8.1* 8.4* 8.3* 9.3*  HCT 27.1* 26.8* 28.3* 27.8* 31.4*  MCV 77.7* 79.1 79.5 79.2 80.1  PLT 165 159 203 207 237   Cardiac Enzymes: No results for input(s): CKTOTAL, CKMB, CKMBINDEX, TROPONINI in the last 168 hours. CBG:  Recent Labs Lab 07/25/15 0747 07/25/15 1234 07/25/15 1622 07/25/15 2037 07/26/15 0737  GLUCAP 147* 135* 251* 190* 164*    Iron Studies: No results for input(s):  IRON, TIBC, TRANSFERRIN, FERRITIN in the last 72 hours. Studies/Results: No results found. Marland Kitchen allopurinol  200 mg Oral Daily  . apixaban  2.5 mg Oral BID  . atorvastatin  20 mg Oral q1800  . DULoxetine  20 mg Oral Daily  . insulin aspart  0-9 Units Subcutaneous TID WC  . metoprolol tartrate  25 mg Oral BID  . montelukast  10 mg Oral QHS  . pantoprazole  40 mg Oral BID  . sodium chloride  3 mL Intravenous Q12H  . sodium chloride  3 mL Intravenous Q12H    BMET    Component Value Date/Time   NA 133* 07/26/2015 0316   NA 140 04/28/2015 0857   K 4.7 07/26/2015 0316   CL 101 07/26/2015 0316   CO2 22 07/26/2015 0316   GLUCOSE 170* 07/26/2015 0316   GLUCOSE 101* 04/28/2015 0857   BUN 48* 07/26/2015 0316   BUN 18 04/28/2015 0857   CREATININE 2.33* 07/26/2015 0316   CREATININE 1.75* 07/17/2015 1631   CALCIUM 9.4 07/26/2015 0316   GFRNONAA 18* 07/26/2015 0316   GFRAA  21* 07/26/2015 0316   CBC    Component Value Date/Time   WBC 7.3 07/26/2015 0316   WBC 6.0 11/16/2013 0912   RBC 3.92 07/26/2015 0316   RBC 3.50* 07/18/2015 1800   RBC 3.85 11/16/2013 0912   HGB 9.3* 07/26/2015 0316   HCT 31.4* 07/26/2015 0316   PLT 237 07/26/2015 0316   MCV 80.1 07/26/2015 0316   MCH 23.7* 07/26/2015 0316   MCH 29.4 11/16/2013 0912   MCHC 29.6* 07/26/2015 0316   MCHC 32.4 11/16/2013 0912   RDW 19.1* 07/26/2015 0316   RDW 16.1* 11/16/2013 0912   LYMPHSABS 1.0 07/18/2015 1023   LYMPHSABS 2.4 11/16/2013 0912   MONOABS 0.4 07/18/2015 1023   EOSABS 0.1 07/18/2015 1023   EOSABS 0.2 11/16/2013 0912   BASOSABS 0.0 07/18/2015 1023   BASOSABS 0.0 11/16/2013 0912     Assessment/Plan:  1. AKI/CKD stage 3 (Scr 1-1.3) in setting of decompensated CHF, low EF and escalating diuretic use. Presumably due to intravascular volume depletion/ATN. Scr improving after gentle hydration. Cont to follow and avoid nephrotoxic agents such as NSAIDs/Cox-II I's, hold ACE/ARB/diuretics for now. 1. Scr and  sodium improved even off of IVF's but worsened after one dose of po lasix and still climbing slowly 2. Hold lasix and follow. 3. Recheck renal panel in am, hopefully with sinus rhythm she will have better renal perfusion. 4. Hold off discharge until we see improvement of her Scr  2. Acute on chronic systolic CHF- appears slightly volume overloaded on lung exam 1. hold any more lasix 3. HTN- stable  4. New A fib- with RVR. Plan per cardiology 1. S/p TEE and DC cardioversion 07/25/15 and appears to remain in NSR 5. Hyperkalemia- due to #1. Resolved 6. Anemia- appears acute and work up underway but on hold due to new onset A fib and AKI. 1. Hgb was normal in 2015 but no more recent labs to compare. Possibly related to CKD but should not be so acute.  2. Low iron stores and s/p one dose of IV feraheme 3. consider starting EPO if this has been more chronic (will need outpt records) 7. Valvular heart disease- per Cardiology 8. DM- metformin on hold due to AKI. Plan per primary svc.  Jancarlos Thrun A

## 2015-07-27 LAB — GLUCOSE, CAPILLARY
GLUCOSE-CAPILLARY: 138 mg/dL — AB (ref 65–99)
GLUCOSE-CAPILLARY: 287 mg/dL — AB (ref 65–99)
GLUCOSE-CAPILLARY: 203 mg/dL — AB (ref 65–99)
Glucose-Capillary: 205 mg/dL — ABNORMAL HIGH (ref 65–99)

## 2015-07-27 LAB — CBC
HCT: 27.1 % — ABNORMAL LOW (ref 36.0–46.0)
Hemoglobin: 8 g/dL — ABNORMAL LOW (ref 12.0–15.0)
MCH: 23.4 pg — AB (ref 26.0–34.0)
MCHC: 29.5 g/dL — ABNORMAL LOW (ref 30.0–36.0)
MCV: 79.2 fL (ref 78.0–100.0)
PLATELETS: 207 10*3/uL (ref 150–400)
RBC: 3.42 MIL/uL — AB (ref 3.87–5.11)
RDW: 19.3 % — AB (ref 11.5–15.5)
WBC: 5.8 10*3/uL (ref 4.0–10.5)

## 2015-07-27 LAB — RENAL FUNCTION PANEL
Albumin: 3.1 g/dL — ABNORMAL LOW (ref 3.5–5.0)
Anion gap: 11 (ref 5–15)
BUN: 49 mg/dL — ABNORMAL HIGH (ref 6–20)
CALCIUM: 9.4 mg/dL (ref 8.9–10.3)
CO2: 22 mmol/L (ref 22–32)
CREATININE: 2.18 mg/dL — AB (ref 0.44–1.00)
Chloride: 101 mmol/L (ref 101–111)
GFR, EST AFRICAN AMERICAN: 23 mL/min — AB (ref >60)
GFR, EST NON AFRICAN AMERICAN: 20 mL/min — AB (ref >60)
Glucose, Bld: 140 mg/dL — ABNORMAL HIGH (ref 65–99)
Phosphorus: 4.5 mg/dL (ref 2.5–4.6)
Potassium: 4.3 mmol/L (ref 3.5–5.1)
SODIUM: 134 mmol/L — AB (ref 135–145)

## 2015-07-27 MED ORDER — TRAZODONE HCL 50 MG PO TABS
25.0000 mg | ORAL_TABLET | Freq: Once | ORAL | Status: AC
  Administered 2015-07-27: 25 mg via ORAL
  Filled 2015-07-27: qty 1

## 2015-07-27 MED ORDER — SENNOSIDES-DOCUSATE SODIUM 8.6-50 MG PO TABS
1.0000 | ORAL_TABLET | Freq: Every day | ORAL | Status: DC | PRN
  Administered 2015-07-27 – 2015-07-28 (×2): 1 via ORAL
  Filled 2015-07-27 (×2): qty 1

## 2015-07-27 NOTE — Progress Notes (Signed)
Patient Name: Danielle Benitez Date of Encounter: 07/27/2015     Active Problems:   Mild CAD - 20-30% RCA in 2013   Mild aortic stenosis   Essential hypertension   Symptomatic anemia   Atrial fibrillation - developed 07/11/15 by PPM interrogation   Moderate mitral regurgitation   Moderate tricuspid regurgitation   Chronic systolic CHF (congestive heart failure) (HCC)   History of pacemaker - Medtronic   Microcytic anemia   Acute renal failure superimposed on stage 3 chronic kidney disease (HCC)   Diabetes mellitus with nephropathy (HCC)    SUBJECTIVE  Breathing is improved, patient off of oxygen currently.  Says that she is feeling well and ready to return home.  CURRENT MEDS . allopurinol  200 mg Oral Daily  . apixaban  2.5 mg Oral BID  . atorvastatin  20 mg Oral q1800  . DULoxetine  20 mg Oral Daily  . insulin aspart  0-9 Units Subcutaneous TID WC  . metoprolol tartrate  25 mg Oral BID  . montelukast  10 mg Oral QHS  . pantoprazole  40 mg Oral BID  . sodium chloride  3 mL Intravenous Q12H  . sodium chloride  3 mL Intravenous Q12H    OBJECTIVE  Filed Vitals:   07/26/15 1600 07/26/15 2200 07/27/15 0427 07/27/15 0807  BP: 112/59 105/67 99/61 116/69  Pulse: 70 70 68   Temp: 98 F (36.7 C) 98.5 F (36.9 C) 97.8 F (36.6 C)   TempSrc: Oral Oral Oral   Resp:  16 16   Height:      Weight:   188 lb (85.276 kg)   SpO2: 100% 100% 99%     Intake/Output Summary (Last 24 hours) at 07/27/15 1144 Last data filed at 07/27/15 1130  Gross per 24 hour  Intake    720 ml  Output    450 ml  Net    270 ml   Filed Weights   07/25/15 0624 07/26/15 0517 07/27/15 0427  Weight: 181 lb 8 oz (82.328 kg) 187 lb 2.7 oz (84.9 kg) 188 lb (85.276 kg)    PHYSICAL EXAM  General: Pleasant, NAD. Neuro: Alert and oriented X 3. Moves all extremities spontaneously. Psych: Normal affect. HEENT:  Normal  Neck: Supple without bruits or JVD. Lungs:  Resp regular and unlabored, CTA. Heart:  RRR no s3, s4, or murmurs.  No pericardial rub Abdomen: Soft, non-tender, non-distended, BS + x 4.  Extremities: No clubbing, cyanosis or edema. DP/PT/Radials 2+ and equal bilaterally.  Accessory Clinical Findings  CBC  Recent Labs  07/26/15 0316 07/27/15 0400  WBC 7.3 5.8  HGB 9.3* 8.0*  HCT 31.4* 27.1*  MCV 80.1 79.2  PLT 237 207   Basic Metabolic Panel  Recent Labs  07/26/15 0316 07/27/15 0400  NA 133* 134*  K 4.7 4.3  CL 101 101  CO2 22 22  GLUCOSE 170* 140*  BUN 48* 49*  CREATININE 2.33* 2.18*  CALCIUM 9.4 9.4  PHOS 5.1* 4.5   Liver Function Tests  Recent Labs  07/26/15 0316 07/27/15 0400  ALBUMIN 3.5 3.1*   No results for input(s): LIPASE, AMYLASE in the last 72 hours. Cardiac Enzymes No results for input(s): CKTOTAL, CKMB, CKMBINDEX, TROPONINI in the last 72 hours. BNP Invalid input(s): POCBNP D-Dimer No results for input(s): DDIMER in the last 72 hours. Hemoglobin A1C No results for input(s): HGBA1C in the last 72 hours. Fasting Lipid Panel No results for input(s): CHOL, HDL, LDLCALC, TRIG, CHOLHDL, LDLDIRECT in  the last 72 hours. Thyroid Function Tests No results for input(s): TSH, T4TOTAL, T3FREE, THYROIDAB in the last 72 hours.  Invalid input(s): FREET3  TELE  Atrial paced ventricular sensed  ECG    Radiology/Studies  Dg Chest 2 View  07/18/2015  CLINICAL DATA:  Worsening shortness of breath EXAM: CHEST  2 VIEW COMPARISON:  07/11/2015 FINDINGS: There is mild bilateral interstitial thickening. There is no focal parenchymal opacity. There is no pleural effusion or pneumothorax. There is stable cardiomegaly. There is a dual lead cardiac pacemaker. There is severe osteoarthritis of the left glenohumeral joint. IMPRESSION: 1. Stable cardiomegaly with mild pulmonary vascular congestion. Electronically Signed   By: Elige Ko   On: 07/18/2015 11:08   Dg Chest 2 View  07/11/2015  CLINICAL DATA:  COPD. Bronchitis. Cough. Wheezing. Shortness  of breath. EXAM: CHEST  2 VIEW COMPARISON:  01/11/2012 chest radiograph . FINDINGS: Stable configuration of dual lead left subclavian pacemaker with lead tips overlying the right atrium and right ventricle. Moderate enlargement of the cardiac silhouette, which appears increased in the interval. Otherwise stable mediastinal contour with mild tortuosity of the thoracic aorta. No pneumothorax. No pleural effusion. No focal lung consolidation. There is cephalization of the pulmonary vasculature without overt pulmonary edema. Moderate degenerative changes in the visualized thoracolumbar spine. IMPRESSION: 1. Worsening moderate enlargement of the cardiac silhouette, cannot exclude pericardial effusion. 2. Cephalization of the pulmonary vasculature without overt pulmonary edema. Electronically Signed   By: Delbert Phenix M.D.   On: 07/11/2015 16:53   US Renal  07/21/2015  CLINICAL DATA:  Acute renal failure EXAM: RENAL / URINARY TRACT ULTRASOUND COMPLETE COMPARISON:  None. FINDINGS: Right Kidney: Length: 10.3 cm. Echogenicity within normal limits. No mass or hydronephrosis visualized. Left Kidney: Length: 10.6 cm. Mildly increased echotexture. No mass or hydronephrosis. Bladder: Appears normal for degree of bladder distention. IMPRESSION: No hydronephrosis or acute findings. Mildly increased echotexture on the left. Electronically Signed   By: Charlett Nose M.D.   On: 07/21/2015 11:11    ASSESSMENT AND PLAN 1.  Paroxysmal atrial fibrillation, now back in normal sinus rhythm following TEE/DCCV yesterday.  On long-term Apixaban 2.  Acute on chronic kidney disease stage III--nephrology following 3.  Medtronic dual-chamber pacemaker 4.  Diabetes mellitus with nephropathy 5.  Valvular heart disease with mild aortic stenosis moderate mitral regurgitation and moderate tricuspid regurgitation and chronic systolic heart failure  Plan: Lasix on hold.  Creatinine had increased but is currently improving now her volume  status is equilibrating.  Will continue to monitor.  Will Elberta Fortis, MD 07/27/2015 11:51 AM

## 2015-07-27 NOTE — Progress Notes (Signed)
Pt sats 100% on RA, throughout Shift

## 2015-07-27 NOTE — Progress Notes (Signed)
Patient ID: Danielle Benitez, female   DOB: [birth year], 81 y.o.   MRN: [id number] S:feels better each day O:BP 116/69 mmHg  Pulse 68  Temp(Src) 97.8 F (36.6 C) (Oral)  Resp 16  Ht  (1.626 m)  Wt 85.276 kg (188 lb)  BMI 32.25 kg/m2  SpO2 99%  Intake/Output Summary (Last 24 hours) at 07/27/15 0855 Last data filed at 07/27/15 0752  Gross per 24 hour  Intake    720 ml  Output    300 ml  Net    420 ml   Intake/Output: I/O last 3 completed shifts: In: 720 [P.O.:720] Out: -   Intake/Output this shift:  Total I/O In: -  Out: 300 [Urine:300] Weight change: 0.376 kg (13.3 oz) Gen:WD frail elderly WF in NAd CVS:no rub Resp:bibasilar crackles WUJ:WJXBJY Ext:tr edema   Recent Labs Lab 07/21/15 0326 07/22/15 0438 07/23/15 0537 07/24/15 0353 07/25/15 0346 07/26/15 0316 07/27/15 0400  NA 134* 135 134* 130* 130* 133* 134*  K 4.5 4.0 4.0 4.1 4.2 4.7 4.3  CL 101 102 100* 98* 98* 101 101  CO2 22 23 21* GLUCOSE 164* 142* 190* 154* 143* 170* 140*  BUN 51* 45* 39* 37* 42* 48* 49*  CREATININE 2.94* 2.25* 1.72* 1.92* 2.12* 2.33* 2.18*  ALBUMIN  --   --   --   --   --  3.5 3.1*  CALCIUM 8.6* 9.1 9.2 9.0 8.9 9.4 9.4  PHOS  --   --   --   --   --  5.1* 4.5   Liver Function Tests:  Recent Labs Lab 07/26/15 0316 07/27/15 0400  ALBUMIN 3.5 3.1*   No results for input(s): LIPASE, AMYLASE in the last 168 hours. No results for input(s): AMMONIA in the last 168 hours. CBC:  Recent Labs Lab 07/23/15 0537 07/24/15 0353 07/25/15 0346 07/26/15 0316 07/27/15 0400  WBC 5.2 5.9 6.4 7.3 5.8  HGB 8.1* 8.4* 8.3* 9.3* 8.0*  HCT 26.8* 28.3* 27.8* 31.4* 27.1*  MCV 79.1 79.5 79.2 80.1 79.2  PLT 159 203 207 237 207   Cardiac Enzymes: No results for input(s): CKTOTAL, CKMB, CKMBINDEX, TROPONINI in the last 168 hours. CBG:  Recent Labs Lab 07/26/15 0737 07/26/15 1139 07/26/15 1605 07/26/15 2059 07/27/15 0734  GLUCAP 164* 236* 192* 188* 138*    Iron Studies: No  results for input(s): IRON, TIBC, TRANSFERRIN, FERRITIN in the last 72 hours. Studies/Results: No results found. Marland Kitchen allopurinol  200 mg Oral Daily  . apixaban  2.5 mg Oral BID  . atorvastatin  20 mg Oral q1800  . DULoxetine  20 mg Oral Daily  . insulin aspart  0-9 Units Subcutaneous TID WC  . metoprolol tartrate  25 mg Oral BID  . montelukast  10 mg Oral QHS  . pantoprazole  40 mg Oral BID  . sodium chloride  3 mL Intravenous Q12H  . sodium chloride  3 mL Intravenous Q12H    BMET    Component Value Date/Time   NA 134* 07/27/2015 0400   NA 140 04/28/2015 0857   K 4.3 07/27/2015 0400   CL 101 07/27/2015 0400   CO2 22 07/27/2015 0400   GLUCOSE 140* 07/27/2015 0400   GLUCOSE 101* 04/28/2015 0857   BUN 49* 07/27/2015 0400   BUN 18 04/28/2015 0857   CREATININE 2.18* 07/27/2015 0400   CREATININE 1.75* 07/17/2015 1631   CALCIUM 9.4 07/27/2015 0400   GFRNONAA 20* 07/27/2015 0400   GFRAA 23* 07/27/2015  0400   CBC    Component Value Date/Time   WBC 5.8 07/27/2015 0400   WBC 6.0 11/16/2013 0912   RBC 3.42* 07/27/2015 0400   RBC 3.50* 07/18/2015 1800   RBC 3.85 11/16/2013 0912   HGB 8.0* 07/27/2015 0400   HCT 27.1* 07/27/2015 0400   PLT 207 07/27/2015 0400   MCV 79.2 07/27/2015 0400   MCH 23.4* 07/27/2015 0400   MCH 29.4 11/16/2013 0912   MCHC 29.5* 07/27/2015 0400   MCHC 32.4 11/16/2013 0912   RDW 19.3* 07/27/2015 0400   RDW 16.1* 11/16/2013 0912   LYMPHSABS 1.0 07/18/2015 1023   LYMPHSABS 2.4 11/16/2013 0912   MONOABS 0.4 07/18/2015 1023   EOSABS 0.1 07/18/2015 1023   EOSABS 0.2 11/16/2013 0912   BASOSABS 0.0 07/18/2015 1023   BASOSABS 0.0 11/16/2013 0912     Assessment/Plan:  1. AKI/CKD stage 3 (Scr 1-1.3) in setting of decompensated CHF, low EF and escalating diuretic use. Presumably due to intravascular volume depletion/ATN. Scr improving after gentle hydration. Cont to follow and avoid nephrotoxic agents such as NSAIDs/Cox-II I's, hold ACE/ARB/diuretics for  now. 1. Scr and sodium improved even off of IVF's but worsened after one dose of po lasix and still climbing slowly 2. Continue to hold lasix for now. 3. Recheck renal panel in am, hopefully with sinus rhythm she will have better renal perfusion. 4. Hold off discharge until we see improvement of her Scr  2. Acute on chronic systolic CHF- appears slightly volume overloaded on lung exam 1. hold any more lasix 3. HTN- stable  4. New A fib- with RVR. Plan per cardiology 1. S/p TEE and DC cardioversion 07/25/15 and appears to remain in NSR 5. Hyperkalemia- due to #1. Resolved 6. Anemia- appears acute and work up underway but on hold due to new onset A fib and AKI. 1. Hgb was normal in 2015 but no more recent labs to compare. Possibly related to CKD but should not be so acute.  2. Low iron stores and s/p one dose of IV feraheme 3. consider starting EPO if this has been more chronic (will need outpt records) 7. Valvular heart disease- per Cardiology 8. DM- metformin on hold due to AKI. Plan per primary svc.  Vasily Fedewa A

## 2015-07-27 NOTE — Progress Notes (Signed)
Pt family has many concerns about pt new diet when she goes home r/t Salt intake.

## 2015-07-27 NOTE — Progress Notes (Signed)
Pt ambulating to BR on RA, 02 Sats 100%. Pt also walked in the hallway with no complaints of SOB. Cecille Rubin, RN

## 2015-07-28 ENCOUNTER — Inpatient Hospital Stay (HOSPITAL_COMMUNITY): Payer: Medicare Other

## 2015-07-28 ENCOUNTER — Other Ambulatory Visit: Payer: Medicare Other

## 2015-07-28 ENCOUNTER — Telehealth: Payer: Self-pay | Admitting: Physician Assistant

## 2015-07-28 ENCOUNTER — Encounter (HOSPITAL_COMMUNITY): Payer: Self-pay | Admitting: Internal Medicine

## 2015-07-28 DIAGNOSIS — I5023 Acute on chronic systolic (congestive) heart failure: Secondary | ICD-10-CM

## 2015-07-28 LAB — RENAL FUNCTION PANEL
ANION GAP: 12 (ref 5–15)
Albumin: 3.2 g/dL — ABNORMAL LOW (ref 3.5–5.0)
BUN: 42 mg/dL — ABNORMAL HIGH (ref 6–20)
CHLORIDE: 101 mmol/L (ref 101–111)
CO2: 20 mmol/L — AB (ref 22–32)
Calcium: 9.1 mg/dL (ref 8.9–10.3)
Creatinine, Ser: 1.73 mg/dL — ABNORMAL HIGH (ref 0.44–1.00)
GFR calc Af Amer: 31 mL/min — ABNORMAL LOW (ref >60)
GFR calc non Af Amer: 26 mL/min — ABNORMAL LOW (ref >60)
GLUCOSE: 169 mg/dL — AB (ref 65–99)
PHOSPHORUS: 4 mg/dL (ref 2.5–4.6)
POTASSIUM: 4.6 mmol/L (ref 3.5–5.1)
Sodium: 133 mmol/L — ABNORMAL LOW (ref 135–145)

## 2015-07-28 LAB — CBC
HEMATOCRIT: 27.6 % — AB (ref 36.0–46.0)
HEMOGLOBIN: 8.3 g/dL — AB (ref 12.0–15.0)
MCH: 24.1 pg — AB (ref 26.0–34.0)
MCHC: 30.1 g/dL (ref 30.0–36.0)
MCV: 80 fL (ref 78.0–100.0)
Platelets: 148 10*3/uL — ABNORMAL LOW (ref 150–400)
RBC: 3.45 MIL/uL — ABNORMAL LOW (ref 3.87–5.11)
RDW: 20.3 % — ABNORMAL HIGH (ref 11.5–15.5)
WBC: 6.2 10*3/uL (ref 4.0–10.5)

## 2015-07-28 LAB — GLUCOSE, CAPILLARY
GLUCOSE-CAPILLARY: 150 mg/dL — AB (ref 65–99)
Glucose-Capillary: 170 mg/dL — ABNORMAL HIGH (ref 65–99)
Glucose-Capillary: 218 mg/dL — ABNORMAL HIGH (ref 65–99)

## 2015-07-28 LAB — BRAIN NATRIURETIC PEPTIDE: B NATRIURETIC PEPTIDE 5: 1616.8 pg/mL — AB (ref 0.0–100.0)

## 2015-07-28 MED ORDER — SENNOSIDES-DOCUSATE SODIUM 8.6-50 MG PO TABS: 1.0000 | ORAL_TABLET | Freq: Every day | ORAL | Status: DC | PRN

## 2015-07-28 MED ORDER — APIXABAN 2.5 MG PO TABS: 2.5000 mg | ORAL_TABLET | Freq: Two times a day (BID) | ORAL | Status: DC

## 2015-07-28 MED ORDER — POLYSACCHARIDE IRON COMPLEX 150 MG PO CAPS: 150.0000 mg | ORAL_CAPSULE | Freq: Two times a day (BID) | ORAL | Status: DC

## 2015-07-28 MED ORDER — SAXAGLIPTIN HCL 2.5 MG PO TABS: 2.5000 mg | ORAL_TABLET | Freq: Every day | ORAL | Status: DC

## 2015-07-28 NOTE — Progress Notes (Signed)
Patient Name: Danielle Benitez Date of Encounter: 07/28/2015  Active Problems:   Mild CAD - 20-30% RCA in 2013   Mild aortic stenosis   Essential hypertension   Symptomatic anemia   Atrial fibrillation - developed 07/11/15 by PPM interrogation   Moderate mitral regurgitation   Moderate tricuspid regurgitation   Chronic systolic CHF (congestive heart failure) (HCC)   History of pacemaker - Medtronic   Microcytic anemia   Acute renal failure superimposed on stage 3 chronic kidney disease (HCC)   Diabetes mellitus with nephropathy [facility])   Primary Cardiologist: Dr Jens Som, Dr Ladona Ridgel  Patient Profile: 81 yo female w/ hx HTN, DM, mild AS, CKD III, MDT PPM, HL, orthostatic hypotension. Dx LVD, afib RVR 10/06 after eval for SOB showed EF newly 25-30%, Hgb 8.7 and rapid afib.  CHADSVASC of 6>>Eliquis. Admitted 10/07 for worsening SOB, anemia. TEE/DCCV 10/14. Renal function worsened w/ diuresis, peak 49/3.41  SUBJECTIVE: No chest pain or palpitations, struggling with low-sodium choices  OBJECTIVE Filed Vitals:   07/27/15 0807 07/27/15 1658 07/27/15 2050 07/28/15 0436  BP: 116/69 115/73 110/64 122/71  Pulse:  70 71 70  Temp:  97.6 F (36.4 C) 98.4 F (36.9 C) 98.6 F (37 C)  TempSrc:  Axillary Oral Oral  Resp:  18    Height:      Weight:    188 lb 3.2 oz (85.367 kg)  SpO2:  100% 100% 96%    Intake/Output Summary (Last 24 hours) at 07/28/15 0848 Last data filed at 07/28/15 0840  Gross per 24 hour  Intake   1200 ml  Output    800 ml  Net    400 ml   Filed Weights   07/26/15 0517 07/27/15 0427 07/28/15 0436  Weight: 187 lb 2.7 oz (84.9 kg) 188 lb (85.276 kg) 188 lb 3.2 oz (85.367 kg)    PHYSICAL EXAM General: Well developed, well nourished, female in no acute distress. Head: Normocephalic, atraumatic.  Neck: Supple without bruits, JVD 10 cm. Lungs:  Resp regular and unlabored, rales bases. Heart: RRR, S1, S2, no S3, S4, soft murmur; no rub. Abdomen: Soft, non-tender,  non-distended, BS + x 4.  Extremities: No clubbing, cyanosis, trace edema.  Neuro: Alert and oriented X 3. Moves all extremities spontaneously. Psych: Normal affect.  LABS: CBC: Recent Labs  07/27/15 0400 07/28/15 0322  WBC 5.8 6.2  HGB 8.0* 8.3*  HCT 27.1* 27.6*  MCV 79.2 80.0  PLT 207 148*   Basic Metabolic Panel: Recent Labs  07/27/15 0400 07/28/15 0322  NA 134* 133*  K 4.3 4.6  CL 101 101  CO2 22 20*  GLUCOSE 140* 169*  BUN 49* 42*  CREATININE 2.18* 1.73*  CALCIUM 9.4 9.1  PHOS 4.5 4.0   Liver Function Tests: Recent Labs  07/27/15 0400 07/28/15 0322  ALBUMIN 3.1* 3.2*   Lab Results  Component Value Date   IRON 18* 07/19/2015   TIBC 431 07/19/2015   FERRITIN 9* 07/19/2015   BNP:  B NATRIURETIC PEPTIDE  Date/Time Value Ref Range Status  07/23/2015 10:10 AM 2816.9* 0.0 - 100.0 pg/mL Final  07/22/2015 04:38 AM 1485.8* 0.0 - 100.0 pg/mL Final   TELE: A pacing        TEE: 10/14  LA, LAA without masses TV is normal At least mod TR MV is normal Mild MR AV is mildly thickened LVEF and RVEF are severely depressed   Radiology/Studies: Dg Chest 2 View 07/18/2015  CLINICAL DATA:  Worsening shortness  of breath EXAM: CHEST  2 VIEW COMPARISON:  07/11/2015 FINDINGS: There is mild bilateral interstitial thickening. There is no focal parenchymal opacity. There is no pleural effusion or pneumothorax. There is stable cardiomegaly. There is a dual lead cardiac pacemaker. There is severe osteoarthritis of the left glenohumeral joint. IMPRESSION: 1. Stable cardiomegaly with mild pulmonary vascular congestion. Electronically Signed   By: Elige Ko   On: 07/18/2015 11:08   Current Medications:  . allopurinol  200 mg Oral Daily  . apixaban  2.5 mg Oral BID  . atorvastatin  20 mg Oral q1800  . DULoxetine  20 mg Oral Daily  . insulin aspart  0-9 Units Subcutaneous TID WC  . metoprolol tartrate  25 mg Oral BID  . montelukast  10 mg Oral QHS  . pantoprazole  40 mg  Oral BID  . sodium chloride  3 mL Intravenous Q12H  . sodium chloride  3 mL Intravenous Q12H   . sodium chloride Stopped (07/21/15 1123)  . sodium chloride      ASSESSMENT AND PLAN:   Acute systolic CHF - CHF new, recent dx - mild pulm vasc cong on admit CXR - wt up 10 lbs since admit - PO Lasix 20 mg 10/08 and 10/09, Lasix 40 10/12, Cr trending down the whole time (peaked 10/08) - believe she would benefit from IV Lasix, will discuss with MD - will recheck CXR and BNP  Active Problems:   Mild CAD - 20-30% RCA in 2013 - Stable    Mild aortic stenosis - see TEE report above, follow    Essential hypertension - good control to low on current rx    Symptomatic anemia - seen by GI, but EGD never performed due to other medical issues - heme negative x 1, alternative causes for anemia felt to exist - GI signed off  - iron is low, will add bid    Atrial fibrillation - developed 07/11/15 by PPM interrogation - s/p TEE/DCCV 10/14 - now pacing - anticoag w/ Eliquis    Moderate mitral regurgitation   Moderate tricuspid regurgitation   Chronic systolic CHF (congestive heart failure) (HCC) - see above    History of pacemaker - Medtronic - follow    Microcytic anemia - see above    Acute renal failure superimposed on stage 3 chronic kidney disease (HCC) - still above baseline, follow - no ACE/ARB    Diabetes mellitus with nephropathy (HCC) - on SSI - Glucophage held 2nd poor renal function - MD advise on adding med or f/u with primary MD  Signed, Theodore Demark , PA-C 8:48 AM 07/28/2015 Patient is holding NSR after TEE/DCCV last week. On exam her jugular venous pressure is elevated with V waves consistent with her known moderate TR. Her lungs are essentially clear at present. Repeat  Chest xray pending. Xray findings will help determine timing of restarting low dose diuretic. Patient denies chest pain or dyspnea. Oxygenating okay on room air. Would hold off on  restarting metformin at this time. Wait for further stabilization of renal function. Can be managed as OP by PCP.

## 2015-07-28 NOTE — Discharge Instructions (Addendum)
Heart-Healthy Eating Plan °Many factors influence your heart health, including eating and exercise habits. Heart (coronary) risk increases with abnormal blood fat (lipid) levels. Heart-healthy meal planning includes limiting unhealthy fats, increasing healthy fats, and making other small dietary changes. This includes maintaining a healthy body weight to help keep lipid levels within a normal range. °WHAT IS MY PLAN?  °Your health care provider recommends that you: °· Get no more than _________% of the total calories in your daily diet from fat. °· Limit your intake of saturated fat to less than _________% of your total calories each day. °· Limit the amount of cholesterol in your diet to less than _________ mg per day. °WHAT TYPES OF FAT SHOULD I CHOOSE? °· Choose healthy fats more often. Choose monounsaturated and polyunsaturated fats, such as olive oil and canola oil, flaxseeds, walnuts, almonds, and seeds. °· Eat more omega-3 fats. Good choices include salmon, mackerel, sardines, tuna, flaxseed oil, and ground flaxseeds. Aim to eat fish at least two times each week. °· Limit saturated fats. Saturated fats are primarily found in animal products, such as meats, butter, and cream. Plant sources of saturated fats include palm oil, palm kernel oil, and coconut oil. °· Avoid foods with partially hydrogenated oils in them. These contain trans fats. Examples of foods that contain trans fats are stick margarine, some tub margarines, cookies, crackers, and other baked goods. °WHAT GENERAL GUIDELINES DO I NEED TO FOLLOW? °· Check food labels carefully to identify foods with trans fats or high amounts of saturated fat. °· Fill one half of your plate with vegetables and green salads. Eat 4-5 servings of vegetables per day. A serving of vegetables equals 1 cup of raw leafy vegetables, ½ cup of raw or cooked cut-up vegetables, or ½ cup of vegetable juice. °· Fill one fourth of your plate with whole grains. Look for the word  "whole" as the first word in the ingredient list. °· Fill one fourth of your plate with lean protein foods. °· Eat 4-5 servings of fruit per day. A serving of fruit equals one medium whole fruit, ¼ cup of dried fruit, ½ cup of fresh, frozen, or canned fruit, or ½ cup of 100% fruit juice. °· Eat more foods that contain soluble fiber. Examples of foods that contain this type of fiber are apples, broccoli, carrots, beans, peas, and barley. Aim to get 20-30 g of fiber per day. °· Eat more home-cooked food and less restaurant, buffet, and fast food. °· Limit or avoid alcohol. °· Limit foods that are high in starch and sugar. °· Avoid fried foods. °· Cook foods by using methods other than frying. Baking, boiling, grilling, and broiling are all great options. Other fat-reducing suggestions include: °¨ Removing the skin from poultry. °¨ Removing all visible fats from meats. °¨ Skimming the fat off of stews, soups, and gravies before serving them. °¨ Steaming vegetables in water or broth. °· Lose weight if you are overweight. Losing just 5-10% of your initial body weight can help your overall health and prevent diseases such as diabetes and heart disease. °· Increase your consumption of nuts, legumes, and seeds to 4-5 servings per week. One serving of dried beans or legumes equals ½ cup after being cooked, one serving of nuts equals 1½ ounces, and one serving of seeds equals ½ ounce or 1 tablespoon. °· You may need to monitor your salt (sodium) intake, especially if you have high blood pressure. Talk with your health care provider or dietitian to get   more information about reducing sodium. °WHAT FOODS CAN I EAT? °Grains °Breads, including French, white, pita, wheat, raisin, rye, oatmeal, and Italian. Tortillas that are neither fried nor made with lard or trans fat. Low-fat rolls, including hotdog and hamburger buns and English muffins. Biscuits. Muffins. Waffles. Pancakes. Light popcorn. Whole-grain cereals. Flatbread. Melba  toast. Pretzels. Breadsticks. Rusks. Low-fat snacks and crackers, including oyster, saltine, matzo, graham, animal, and rye. Rice and pasta, including brown rice and those that are made with whole wheat. °Vegetables °All vegetables. °Fruits °All fruits, but limit coconut. °Meats and Other Protein Sources °Lean, well-trimmed beef, veal, pork, and lamb. Chicken and turkey without skin. All fish and shellfish. Wild duck, rabbit, pheasant, and venison. Egg whites or low-cholesterol egg substitutes. Dried beans, peas, lentils, and tofu. Seeds and most nuts. °Dairy °Low-fat or nonfat cheeses, including ricotta, string, and mozzarella. Skim or 1% milk that is liquid, powdered, or evaporated. Buttermilk that is made with low-fat milk. Nonfat or low-fat yogurt. °Beverages °Mineral water. Diet carbonated beverages. °Sweets and Desserts °Sherbets and fruit ices. Honey, jam, marmalade, jelly, and syrups. Meringues and gelatins. Pure sugar candy, such as hard candy, jelly beans, gumdrops, mints, marshmallows, and small amounts of dark chocolate. Angel food cake. °Eat all sweets and desserts in moderation. °Fats and Oils °Nonhydrogenated (trans-free) margarines. Vegetable oils, including soybean, sesame, sunflower, olive, peanut, safflower, corn, canola, and cottonseed. Salad dressings or mayonnaise that are made with a vegetable oil. Limit added fats and oils that you use for cooking, baking, salads, and as spreads. °Other °Cocoa powder. Coffee and tea. All seasonings and condiments. °The items listed above may not be a complete list of recommended foods or beverages. Contact your dietitian for more options. °WHAT FOODS ARE NOT RECOMMENDED? °Grains °Breads that are made with saturated or trans fats, oils, or whole milk. Croissants. Butter rolls. Cheese breads. Sweet rolls. Donuts. Buttered popcorn. Chow mein noodles. High-fat crackers, such as cheese or butter crackers. °Meats and Other Protein Sources °Fatty meats, such as  hotdogs, short ribs, sausage, spareribs, bacon, ribeye roast or steak, and mutton. High-fat deli meats, such as salami and bologna. Caviar. Domestic duck and goose. Organ meats, such as kidney, liver, sweetbreads, brains, gizzard, chitterlings, and heart. °Dairy °Cream, sour cream, cream cheese, and creamed cottage cheese. Whole milk cheeses, including blue (bleu), Monterey Jack, Brie, [place], American, Havarti, Swiss, cheddar, Camembert, and [place].  Whole or 2% milk that is liquid, evaporated, or condensed. Whole buttermilk. Cream sauce or high-fat cheese sauce. Yogurt that is made from whole milk. °Beverages °Regular sodas and drinks with added sugar. °Sweets and Desserts °Frosting. Pudding. Cookies. Cakes other than angel food cake. Candy that has milk chocolate or white chocolate, hydrogenated fat, butter, coconut, or unknown ingredients. Buttered syrups. Full-fat ice cream or ice cream drinks. °Fats and Oils °Gravy that has suet, meat fat, or shortening. Cocoa butter, hydrogenated oils, palm oil, coconut oil, palm kernel oil. These can often be found in baked products, candy, fried foods, nondairy creamers, and whipped toppings. Solid fats and shortenings, including bacon fat, salt pork, lard, and butter. Nondairy cream substitutes, such as coffee creamers and sour cream substitutes. Salad dressings that are made of unknown oils, cheese, or sour cream. °The items listed above may not be a complete list of foods and beverages to avoid. Contact your dietitian for more information. °  °This information is not intended to replace advice given to you by your health care provider. Make sure you discuss any questions you have with your health   care provider.   Document Released: 07/06/2008 Document Revised: 10/18/2014 Document Reviewed: 03/21/2014 Elsevier Interactive Patient Education 2016 [employer] Atrial Fibrillation Atrial fibrillation is a type of irregular or rapid heartbeat (arrhythmia). In atrial  fibrillation, the heart quivers continuously in a chaotic pattern. This occurs when parts of the heart receive disorganized signals that make the heart unable to pump blood normally. This can increase the risk for stroke, heart failure, and other heart-related conditions. There are different types of atrial fibrillation, including:  Paroxysmal atrial fibrillation. This type starts suddenly, and it usually stops on its own shortly after it starts.  Persistent atrial fibrillation. This type often lasts longer than a week. It may stop on its own or with treatment.  Long-lasting persistent atrial fibrillation. This type lasts longer than 12 months.  Permanent atrial fibrillation. This type does not go away. Talk with your health care provider to learn about the type of atrial fibrillation that you have. CAUSES This condition is caused by some heart-related conditions or procedures, including:  A heart attack.  Coronary artery disease.  Heart failure.  Heart valve conditions.  High blood pressure.  Inflammation of the sac that surrounds the heart (pericarditis).  Heart surgery.  Certain heart rhythm disorders, such as Wolf-Parkinson-White syndrome. Other causes include:  Pneumonia.  Obstructive sleep apnea.  Blockage of an artery in the lungs (pulmonary embolism, or PE).  Lung cancer.  Chronic lung disease.  Thyroid problems, especially if the thyroid is overactive (hyperthyroidism).  Caffeine.  Excessive alcohol use or illegal drug use.  Use of some medicines, including certain decongestants and diet pills. Sometimes, the cause cannot be found. RISK FACTORS This condition is more likely to develop in:  People who are older in age.  People who smoke.  People who have diabetes mellitus.  People who are overweight (obese).  Athletes who exercise vigorously. SYMPTOMS Symptoms of this condition include:  A feeling that your heart is beating rapidly or  irregularly.  A feeling of discomfort or pain in your chest.  Shortness of breath.  Sudden light-headedness or weakness.  Getting tired easily during exercise. In some cases, there are no symptoms. DIAGNOSIS Your health care provider may be able to detect atrial fibrillation when taking your pulse. If detected, this condition may be diagnosed with:  An electrocardiogram (ECG).  A Holter monitor test that records your heartbeat patterns over a 24-hour period.  Transthoracic echocardiogram (TTE) to evaluate how blood flows through your heart.  Transesophageal echocardiogram (TEE) to view more detailed images of your heart.  A stress test.  Imaging tests, such as a CT scan or chest X-ray.  Blood tests. TREATMENT The main goals of treatment are to prevent blood clots from forming and to keep your heart beating at a normal rate and rhythm. The type of treatment that you receive depends on many factors, such as your underlying medical conditions and how you feel when you are experiencing atrial fibrillation. This condition may be treated with:  Medicine to slow down the heart rate, bring the heart's rhythm back to normal, or prevent clots from forming.  Electrical cardioversion. This is a procedure that resets your heart's rhythm by delivering a controlled, low-energy shock to the heart through your skin.  Different types of ablation, such as catheter ablation, catheter ablation with pacemaker, or surgical ablation. These procedures destroy the heart tissues that send abnormal signals. When the pacemaker is used, it is placed under your skin to help your heart beat in a  regular rhythm. HOME CARE INSTRUCTIONS  Take over-the counter and prescription medicines only as told by your health care provider.  If your health care provider prescribed a blood-thinning medicine (anticoagulant), take it exactly as told. Taking too much blood-thinning medicine can cause bleeding. If you do not take  enough blood-thinning medicine, you will not have the protection that you need against stroke and other problems.  Do not use tobacco products, including cigarettes, chewing tobacco, and e-cigarettes. If you need help quitting, ask your health care provider.  If you have obstructive sleep apnea, manage your condition as told by your health care provider.  Do not drink alcohol.  Do not drink beverages that contain caffeine, such as coffee, soda, and tea.  Maintain a healthy weight. Do not use diet pills unless your health care provider approves. Diet pills may make heart problems worse.  Follow diet instructions as told by your health care provider.  Exercise regularly as told by your health care provider.  Keep all follow-up visits as told by your health care provider. This is important. PREVENTION  Avoid drinking beverages that contain caffeine or alcohol.  Avoid certain medicines, especially medicines that are used for breathing problems.  Avoid certain herbs and herbal medicines, such as those that contain ephedra or ginseng.  Do not use illegal drugs, such as cocaine and amphetamines.  Do not smoke.  Manage your high blood pressure. SEEK MEDICAL CARE IF:  You notice a change in the rate, rhythm, or strength of your heartbeat.  You are taking an anticoagulant and you notice increased bruising.  You tire more easily when you exercise or exert yourself. SEEK IMMEDIATE MEDICAL CARE IF:  You have chest pain, abdominal pain, sweating, or weakness.  You feel nauseous.  You notice blood in your vomit, bowel movement, or urine.  You have shortness of breath.  You suddenly have swollen feet and ankles.  You feel dizzy.  You have sudden weakness or numbness of the face, arm, or leg, especially on one side of the body.  You have trouble speaking, trouble understanding, or both (aphasia).  Your face or your eyelid droops on one side. These symptoms may represent a  serious problem that is an emergency. Do not wait to see if the symptoms will go away. Get medical help right away. Call your local emergency services (911 in the [place] Do not drive yourself to the hospital.   This information is not intended to replace advice given to you by your health care provider. Make sure you discuss any questions you have with your health care provider.   Document Released: 09/27/2005 Document Revised: 06/18/2015 Document Reviewed: 01/22/2015 Elsevier Interactive Patient Education 2016 [employer]. Anemia, Nonspecific Anemia is a condition in which the concentration of red blood cells or hemoglobin in the blood is below normal. Hemoglobin is a substance in red blood cells that carries oxygen to the tissues of the body. Anemia results in not enough oxygen reaching these tissues.  CAUSES  Common causes of anemia include:   Excessive bleeding. Bleeding may be internal or external. This includes excessive bleeding from periods (in women) or from the intestine.   Poor nutrition.   Chronic kidney, thyroid, and liver disease.  Bone marrow disorders that decrease red blood cell production.  Cancer and treatments for cancer.  HIV, AIDS, and their treatments.  Spleen problems that increase red blood cell destruction.  Blood disorders.  Excess destruction of red blood cells due to infection, medicines, and autoimmune  disorders. SIGNS AND SYMPTOMS   Minor weakness.   Dizziness.   Headache.  Palpitations.   Shortness of breath, especially with exercise.   Paleness.  Cold sensitivity.  Indigestion.  Nausea.  Difficulty sleeping.  Difficulty concentrating. Symptoms may occur suddenly or they may develop slowly.  DIAGNOSIS  Additional blood tests are often needed. These help your health care provider determine the best treatment. Your health care provider will check your stool for blood and look for other causes of blood loss.  TREATMENT    Treatment varies depending on the cause of the anemia. Treatment can include:   Supplements of iron, vitamin B12, or folic acid.   Hormone medicines.   A blood transfusion. This may be needed if blood loss is severe.   Hospitalization. This may be needed if there is significant continual blood loss.   Dietary changes.  Spleen removal. HOME CARE INSTRUCTIONS Keep all follow-up appointments. It often takes many weeks to correct anemia, and having your health care provider check on your condition and your response to treatment is very important. SEEK IMMEDIATE MEDICAL CARE IF:   You develop extreme weakness, shortness of breath, or chest pain.   You become dizzy or have trouble concentrating.  You develop heavy vaginal bleeding.   You develop a rash.   You have bloody or black, tarry stools.   You faint.   You vomit up blood.   You vomit repeatedly.   You have abdominal pain.  You have a fever or persistent symptoms for more than 2-3 days.   You have a fever and your symptoms suddenly get worse.   You are dehydrated.  MAKE SURE YOU:  Understand these instructions.  Will watch your condition.  Will get help right away if you are not doing well or get worse.   This information is not intended to replace advice given to you by your health care provider. Make sure you discuss any questions you have with your health care provider.   Document Released: 11/04/2004 Document Revised: 05/30/2013 Document Reviewed: 03/23/2013 Elsevier Interactive Patient Education [employer]

## 2015-07-28 NOTE — Progress Notes (Signed)
Assessment/Plan:  1. AKI/CKD stage 3 (Scr 1-1.3) in setting of decompensated CHF, low EF and escalating diuretic use. Presumably due to intravascular volume depletion/ATN. Scr improving. Cont to follow and avoid nephrotoxic agents such as NSAIDs/Cox-II I's, hold ACE/ARB for now.  Can restart diuretic as outpt with close OP follow up of labs and exam. Doubt renal f/u needed at this time.  We will sign off.  2. Acute on chronic systolic CHF- appears compensated 3. HTN- stable  4. New A fib- with RVR. S/p TEE and DC cardioversion 10/14 in NSR on Eliquis 5. Hyperkalemia- due to #1. Resolved 6. Anemia 7. Valvular heart disease- per Cardiology 8. DM- metformin on hold due to AKI.Would resume as OP as renal fct stabilizes per primary svc.  Subjective: Interval History: none.  Objective: Vital signs in last 24 hours: Temp:  [97.6 F (36.4 C)-98.6 F (37 C)] 98.6 F (37 C) (10/17 0436) Pulse Rate:  [70-71] 70 (10/17 0436) Resp:  [18] 18 (10/16 1658) BP: (110-122)/(64-73) 122/71 mmHg (10/17 0436) SpO2:  [96 %-100 %] 96 % (10/17 0436) Weight:  [85.367 kg (188 lb 3.2 oz)] 85.367 kg (188 lb 3.2 oz) (10/17 0436) Weight change: 0.091 kg (3.2 oz)  Intake/Output from previous day: 10/16 0701 - 10/17 0700 In: 960 [P.O.:960] Out: 1100 [Urine:1100] Intake/Output this shift:    General appearance: alert and cooperative Resp: clear to auscultation bilaterally Chest wall: no tenderness Cardio: regular rate and rhythm, S1, S2 normal, no murmur, click, rub or gallop GI: soft, non-tender; bowel sounds normal; no masses,  no organomegaly Extremities: extremities normal, atraumatic, no cyanosis or edema  Lab Results:  Recent Labs  07/27/15 0400 07/28/15 0322  WBC 5.8 6.2  HGB 8.0* 8.3*  HCT 27.1* 27.6*  PLT 207 148*   BMET:  Recent Labs  07/27/15 0400 07/28/15 0322  NA 134* 133*  K 4.3 4.6  CL 101 101  CO2 22 20*  GLUCOSE 140* 169*  BUN 49* 42*  CREATININE 2.18* 1.73*   CALCIUM 9.4 9.1   No results for input(s): PTH in the last 72 hours. Iron Studies: No results for input(s): IRON, TIBC, TRANSFERRIN, FERRITIN in the last 72 hours. Studies/Results: No results found.  Scheduled: . allopurinol  200 mg Oral Daily  . apixaban  2.5 mg Oral BID  . atorvastatin  20 mg Oral q1800  . DULoxetine  20 mg Oral Daily  . insulin aspart  0-9 Units Subcutaneous TID WC  . metoprolol tartrate  25 mg Oral BID  . montelukast  10 mg Oral QHS  . pantoprazole  40 mg Oral BID  . sodium chloride  3 mL Intravenous Q12H  . sodium chloride  3 mL Intravenous Q12H    LOS: 10 days   Danielle Benitez C 07/28/2015,8:39 AM

## 2015-07-28 NOTE — Progress Notes (Signed)
Inpatient Diabetes Program Recommendations  AACE/ADA: New Consensus Statement on Inpatient Glycemic Control (2015)  Target Ranges:  Prepandial:   less than 140 mg/dL      Peak postprandial:   less than 180 mg/dL (1-2 hours)      Critically ill patients:  140 - 180 mg/dL   Review of Glycemic Control   Inpatient Diabetes Program Recommendations:  Correction (SSI): Please consder increase correction to moderate tidwc   Thank you Lenor Coffin, RN, MSN, CDE  Diabetes Inpatient Program Office: [phone] Pager: [phone] 8:00 am to 5:00 pm

## 2015-07-28 NOTE — Progress Notes (Signed)
Pt being discharged home with family. Pt daughter [name] verbalized understanding of all discharge instructions. Pt and daughter instructed to call department if there are any questions regarding discharge. IV removed, follow up appts scheduled

## 2015-07-28 NOTE — Progress Notes (Signed)
Nutrition Education Note  RD consulted for nutrition education regarding new onset CHF.  RD provided "Low Sodium Nutrition Therapy" handout from the Academy of [employer]. Reviewed patient's dietary recall. Spoke with patient's daughter over the phone. Provided examples on ways to decrease sodium intake in diet. Discouraged intake of processed foods and use of salt shaker. Encouraged fresh fruits and vegetables as well as whole grain sources of carbohydrates to maximize fiber intake.   RD discussed why it is important for patient to adhere to diet recommendations, and emphasized the role of fluids, foods to avoid, and importance of weighing self daily. Teach back method used.  Expect good compliance.  Body mass index is 32.29 kg/(m^2). Pt meets criteria for class 1 obesity based on current BMI.  Current diet order is heart healthy CHO modified, patient is consuming approximately 50-100% of meals at this time. Labs and medications reviewed. No further nutrition interventions warranted at this time. RD contact information provided. If additional nutrition issues arise, please re-consult RD.    Joaquin Courts, RD, LDN, CNSC Pager [phone] After Hours Pager [phone]

## 2015-07-28 NOTE — Progress Notes (Signed)
Physical Therapy Treatment Patient Details Name: Danielle Benitez MRN: [id number] DOB: [birth year] Today's Date: 07/28/2015    History of Present Illness Danielle Benitez is Benitez 81 y.o. female with history of HTN, orthostatic hypotension, DM, mild aortic stenosis, CKD stage III, symptomatic bradycardia s/p Medtronic PPM, RBBB/LAFB, dyslipidemia, nonobstructive CAD in 2013 (20-30% prox RCA), and diagnosis of LV dysfunction/afib who presented to the ER for worsening hemoglobin level.  S/p cardioversion 10/14.    PT Comments    Patient progressing well towards PT goals. Improved ambulation distance today with less assist. Continues to require standing and seated rest breaks during ambulation due to fatigue and decreased endurance. Highly motivated to return to independence. Eager to return home today. Will follow acutely per current POC.   Follow Up Recommendations  Home health PT;Supervision - Intermittent     Equipment Recommendations  None recommended by PT    Recommendations for Other Services       Precautions / Restrictions Precautions Precautions: Fall Restrictions Weight Bearing Restrictions: No    Mobility  Bed Mobility Overal bed mobility: Needs Assistance Bed Mobility: Sit to Supine       Sit to supine: Supervision   General bed mobility comments: HOB flat, no use of rails to simulate home. Assist to scoot up in bed for repositioning.   Transfers Overall transfer level: Needs assistance Equipment used: Rolling walker (2 wheeled) Transfers: Sit to/from Stand Sit to Stand: Supervision         General transfer comment: Supervision for safety. Increased difficulty standing from low surfaces - requires multiple attempts and use of momentum to stand but no physical assist needed. Stood from chair x1, from [employer], from toilet x1.   Ambulation/Gait Ambulation/Gait assistance: Min guard Ambulation Distance (Feet): 100 Feet (x2 bouts) Assistive device: Rolling walker (2  wheeled) Gait Pattern/deviations: Step-through pattern;Antalgic;Decreased stride length   Gait velocity interpretation: Below normal speed for age/gender General Gait Details: Increased trunk flexion which improves with verbal cues. Cues for RW proximity and management. Multiple short standing rest breaks due to SOB, fatigue. 1 longer seated rest break.   Stairs            Wheelchair Mobility    Modified Rankin (Stroke Patients Only)       Balance Overall balance assessment: Needs assistance Sitting-balance support: Feet supported;No upper extremity supported Sitting balance-Leahy Scale: Good     Standing balance support: During functional activity Standing balance-Leahy Scale: Fair Standing balance comment: Furniture walking within room- unsteady. Able to perform pericare without UE support for short period and wash hands at sink with 1 UE support.                    Cognition Arousal/Alertness: Awake/alert Behavior During Therapy: WFL for tasks assessed/performed Overall Cognitive Status: Within Functional Limits for tasks assessed                      Exercises      General Comments        Pertinent Vitals/Pain Pain Assessment: Faces Faces Pain Scale: Hurts little more Pain Location: BLEs- arthritic  knees Pain Descriptors / Indicators: Sore Pain Intervention(s): Monitored during session;Repositioned    Home Living                      Prior Function            PT Goals (current goals can now be found in the care plan  section) Progress towards PT goals: Progressing toward goals    Frequency  Min 3X/week    PT Plan Current plan remains appropriate    Co-evaluation             End of Session Equipment Utilized During Treatment: Gait belt Activity Tolerance: Patient tolerated treatment well;Patient limited by fatigue Patient left: in bed;with call bell/phone within reach     Time: 1354-1424 PT Time Calculation  (min) (ACUTE ONLY): 30 min  Charges:  $Gait Training: 8-22 mins $Therapeutic Activity: 8-22 mins                    G Codes:      Danielle Benitez Danielle Benitez 07/28/2015, 2:42 PM  Danielle Benitez, PT, DPT [phone]

## 2015-07-28 NOTE — Telephone Encounter (Signed)
New problem    Pt has TCM w/Danya Meunier 10.21.16.

## 2015-07-28 NOTE — Discharge Summary (Signed)
CARDIOLOGY DISCHARGE SUMMARY   Patient ID: Danielle Benitez MRN: [id number] DOB/AGE: 1931-12-19 81 y.o.  Admit date: 07/18/2015 Discharge date: 07/28/2015  PCP: Kimber Relic, MD Primary Cardiologist: Dr. Jens Som Electrophysiologist: Dr. Ladona Ridgel  Primary Discharge Diagnosis:  Acute on chronic systolic congestive heart failure Secondary Discharge Diagnosis:    Mild CAD - 20-30% RCA in 2013   Mild aortic stenosis   Essential hypertension   Symptomatic anemia   Atrial fibrillation - developed 07/11/15 by PPM interrogation   Moderate mitral regurgitation   Moderate tricuspid regurgitation   Chronic systolic CHF (congestive heart failure) (HCC)   History of pacemaker - Medtronic   Microcytic anemia   Acute renal failure superimposed on stage 3 chronic kidney disease (HCC)   Diabetes mellitus with nephropathy (HCC)   Hyperkalemia   Consults: Gastroenterology, Nephrology, [facility]  Procedures: Two-view chest x-ray, renal ultrasound, TEE  Hospital Course: Danielle Benitez is a 81 y.o. female with a history of HTN, DM, mild AS, CKD III, MDT PPM, HL, and orthostatic hypotension.  She was seen in the office for shortness of breath and was in atrial fibrillation with rapid ventricular response. An echocardiogram was performed which showed new left ventricular dysfunction with an EF of 25-30 percent. Lab work showed a hemoglobin of 8.7.   She was started on appropriate medications with plans for outpatient management. However she developed increasing shortness of breath and came to the hospital where she was admitted for further evaluation and treatment.  The shortness of breath was felt to be multifactorial, with a contribution of CHF as well as anemia and atrial fibrillation.  #1 anemia: A GI consult was called as her hemoglobin was 8.7. She was seen by Dr. Randa Evens and initially an EGD was planned. However, with her rapid atrial fibrillation and heart failure, the procedure was deferred. A  stool was heme-negative. On 07/21/2015, she was seen by Dr. Jeannetta Ellis who felt and discopathy would be somewhat risky and dubious benefit. He also felt that her anemia was not that much different than it was a few years ago. She is encouraged to follow-up with [facility] as an outpatient. Iron levels were greatly decreased, she was given one dose of IV iron and she was started on oral iron supplementation.   #2 atrial fibrillation: Metoprolol was tried for rate control and increased as her blood pressure would tolerate. It was felt she would benefit from restoration of sinus rhythm, so a TEE cardioversion was planned. This was performed on 07/25/2015, results are below. The cardioversion was successful and she maintained an atrial paced rhythm throughout the rest of her hospital stay. She is to continue on the beta blocker.  #3 acute systolic CHF: She had some volume overload by exam, but also had some renal insufficiency. She was given some oral Lasix but never required IV Lasix. Her weight at discharge is 188 pounds. It was hoped that once she was back in the sinus rhythm, her need for diuretics would decrease. Currently she is still off diuretics. She will need an early follow-up in the office, and can be restarted on oral Lasix at that time if her renal function remains stable. She is to follow her daily weights.   #4 acute renal failure superimposed on CKD stage III: Previously, her creatinine had been 1.00 up to 1.4. However, on the day of admission, her potassium was 5.6, her BUN 25 and her creatinine 1.75. This was followed closely during her hospital stay. Initially,  her BUN/creatinine trended up, peaking at 49/3.41. A Nephrology consult was called. She was seen by the nephrologist and it was recommended that she stay off the metformin as well as any ACE inhibitor or ARB. He recommended avoiding any nephrotoxic medications. Her discharge labs are below. Even with getting several doses of  Lasix, her BUN and creatinine improved steadily during the rest of her hospital stay. This will be followed closely as an outpatient. Because of the hyperkalemia that was seen on admission, she will not be restarted on Aldactone.  #5 diabetes: Danielle Benitez struggled with low sodium and diabetic options. However, she is very determined to follow dietary restrictions order to minimize her chance of ending up back in the hospital. She was seen by the nutritionist and was given options and encouraged to avoid processed or packaged foods and the use of the salt shaker. The nutritionist spoke with her daughter by phone which is appropriate because her daughter does some of the food preparation. Her metformin is discontinued because of her worsening renal function. The ongoing was decreased from 5 mg down to 2.5 mg daily.  #6 systemic anticoagulation: Danielle Benitez had been started on Eliquis as an outpatient. However, with a worsening in her renal function, the dose was decreased from 5 mg twice a day down to 2.5 mg twice a day.  On 07/28/2015, Danielle Benitez was seen by Dr. Patty Sermons and all data were reviewed. Dr. Lowell Guitar felt no further inpatient workup was indicated. Gastroenterology had signed off. Her hemoglobin and hematocrit were low but stable. It is hoped they will improve with iron supplementation. She was maintaining her oxygen saturation with ambulation. No further inpatient workup is indicated and she is considered stable for discharge, to follow up as an outpatient.  Labs:   Lab Results  Component Value Date   WBC 6.2 07/28/2015   HGB 8.3* 07/28/2015   HCT 27.6* 07/28/2015   MCV 80.0 07/28/2015   PLT 148* 07/28/2015     Recent Labs Lab 07/28/15 0322  NA 133*  K 4.6  CL 101  CO2 20*  BUN 42*  CREATININE 1.73*  CALCIUM 9.1  GLUCOSE 169*   B NATRIURETIC PEPTIDE  Date/Time Value Ref Range Status  07/28/2015 11:36 AM 1616.8* 0.0 - 100.0 pg/mL Final  07/23/2015 10:10 AM 2816.9* 0.0 - 100.0 pg/mL  Final   Lab Results  Component Value Date   IRON 18* 07/19/2015   TIBC 431 07/19/2015   FERRITIN 9* 07/19/2015    Radiology: Dg Chest 2 View 07/28/2015  CLINICAL DATA:  Patient admitted 1 week ago with shortness of breath. EXAM: CHEST  2 VIEW COMPARISON:  07/18/2015 FINDINGS: There is mild bilateral interstitial thickening. There is no focal parenchymal opacity. There are bilateral small pleural effusions. There is no pneumothorax. There is stable cardiomegaly. There is a dual lead cardiac pacemaker. There is severe osteoarthritis of the left glenohumeral joint. IMPRESSION: Stable cardiomegaly with mild pulmonary vascular congestion. Electronically Signed   By: Elige Ko   On: 07/28/2015 12:09   Dg Chest 2 View 07/18/2015  CLINICAL DATA:  Worsening shortness of breath EXAM: CHEST  2 VIEW COMPARISON:  07/11/2015 FINDINGS: There is mild bilateral interstitial thickening. There is no focal parenchymal opacity. There is no pleural effusion or pneumothorax. There is stable cardiomegaly. There is a dual lead cardiac pacemaker. There is severe osteoarthritis of the left glenohumeral joint. IMPRESSION: 1. Stable cardiomegaly with mild pulmonary vascular congestion. Electronically Signed   By: Elige Ko  On: 07/18/2015 11:08   US Renal 07/21/2015  CLINICAL DATA:  Acute renal failure EXAM: RENAL / URINARY TRACT ULTRASOUND COMPLETE COMPARISON:  None. FINDINGS: Right Kidney: Length: 10.3 cm. Echogenicity within normal limits. No mass or hydronephrosis visualized. Left Kidney: Length: 10.6 cm. Mildly increased echotexture. No mass or hydronephrosis. Bladder: Appears normal for degree of bladder distention. IMPRESSION: No hydronephrosis or acute findings. Mildly increased echotexture on the left. Electronically Signed   By: Charlett Nose M.D.   On: 07/21/2015 11:11   EKG: 07/23/2015 Atrial fibrillation with rapid ventricular response Right bundle branch block, no significant change  TEE:  07/25/2015 Conclusions LA, LAA without masses TV is normal At least mod TR MV is normal Mild MR AV is mildly thickened LVEF and RVEF are severely depressed   FOLLOW UP PLANS AND APPOINTMENTS Allergies  Allergen Reactions  . Beta Adrenergic Blockers     Couldn't speak or hear  . Jardiance [Empagliflozin]     Rash, kidney issues  . Fluzone [Flu Virus Vaccine] Other (See Comments)    Feeling unwell for a week. Cold symptoms/flu symtoms  . Tetanus Toxoids     UNKNOWN     Medication List    STOP taking these medications        furosemide 40 MG tablet  Commonly known as:  LASIX     metFORMIN 500 MG 24 hr tablet  Commonly known as:  GLUCOPHAGE-XR     spironolactone 25 MG tablet  Commonly known as:  ALDACTONE      TAKE these medications        albuterol 108 (90 BASE) MCG/ACT inhaler  Commonly known as:  PROVENTIL HFA;VENTOLIN HFA  Inhale 2 puffs into the lungs every 6 (six) hours as needed for wheezing or shortness of breath.     allopurinol 300 MG tablet  Commonly known as:  ZYLOPRIM  Take 1 tablet (300 mg total) by mouth daily.     apixaban 2.5 MG Tabs tablet  Commonly known as:  ELIQUIS  Take 1 tablet (2.5 mg total) by mouth 2 (two) times daily.     aspirin 81 MG chewable tablet  Chew 81 mg by mouth daily.     atorvastatin 40 MG tablet  Commonly known as:  LIPITOR  Take 0.5 tablets (20 mg total) by mouth daily at 6 PM.     DULoxetine 20 MG capsule  Commonly known as:  CYMBALTA  Take 1 tablet by mouth daily to help treat anxiety and nerves.     HYDROcodone-acetaminophen 5-325 MG tablet  Commonly known as:  NORCO/VICODIN  Take 1 tablet by mouth every 6 (six) hours as needed for moderate pain (joint pain).     iron polysaccharides 150 MG capsule  Commonly known as:  NU-IRON  Take 1 capsule (150 mg total) by mouth 2 (two) times daily.     lisinopril 20 MG tablet  Commonly known as:  PRINIVIL,ZESTRIL  Take 1/2 tablet by mouth once daily for blood  pressure     metoprolol tartrate 25 MG tablet  Commonly known as:  LOPRESSOR  Take 1 tablet (25 mg total) by mouth 2 (two) times daily.     montelukast 10 MG tablet  Commonly known as:  SINGULAIR  Take 1 tablet (10 mg total) by mouth at bedtime.     ONETOUCH DELICA LANCETS FINE Misc  Check blood sugar daily as directed DX E11.65     pantoprazole 40 MG tablet  Commonly known as:  PROTONIX  Take  1 tablet (40 mg total) by mouth daily.     saxagliptin HCl 2.5 MG Tabs tablet  Commonly known as:  ONGLYZA  Take 1 tablet (2.5 mg total) by mouth daily. Take one tablet by mouth once daily for blood sugar     senna-docusate 8.6-50 MG tablet  Commonly known as:  Senokot-S  Take 1 tablet by mouth daily as needed for mild constipation.     triamcinolone 0.025 % cream  Commonly known as:  KENALOG  Apply 1 application topically 4 (four) times daily as needed (itching).        Discharge Instructions    (HEART FAILURE PATIENTS) Call MD:  Anytime you have any of the following symptoms: 1) 3 pound weight gain in 24 hours or 5 pounds in 1 week 2) shortness of breath, with or without a dry hacking cough 3) swelling in the hands, feet or stomach 4) if you have to sleep on extra pillows at night in order to breathe.    Complete by:  As directed      AMB Referral to [facility] Care Management    Complete by:  As directed   Please assign to [facility] RNCM for DM and CHF management. Consents signed. She will need reinforcement on how to check blood sugars as well. Will likely have home health services as well. Currently in hospital. Please call with questions. Raiford Noble, MSN-Ed, RN,BSN [facility] Liaison-[phone]  Reason for consult:  Please assign to [facility] RNCM  Diagnoses of:   Heart Failure Diabetes    Expected date of contact:  1-3 days (reserved for hospital discharges)     Diet - low sodium heart healthy    Complete by:  As directed      Diet Carb Modified    Complete by:  As directed       Increase activity slowly    Complete by:  As directed           Follow-up Information    Follow up with [facility].   Why:  Physical Therapy   Contact information:   [address] [place] [address] [phone]       Follow up with [facility].   Why:  Paediatric nurse.    Contact information:   [address] [place] [address] [phone]       Follow up with Ronie Spies, PA-C On 08/01/2015.   Specialties:  Cardiology, Radiology   Why:  See provider at 9:30 AM, please arrive 15 minutes early for paperwork.   Contact information:   [address] [place] [address] [phone]       Follow up with [facility].   Why:  As needed   Contact information:   [address] [place] [address] [phone]       Follow up with [facility], Lenon Curt, MD.   Specialty:  Internal Medicine   Why:  Diabetes management.   Contact information:   [address] [place] [address] [phone]       BRING ALL MEDICATIONS WITH YOU TO FOLLOW UP APPOINTMENTS  Time spent with patient to include physician time: 48 min Signed: Theodore Demark, PA-C 07/28/2015, 3:05 PM Co-Sign MD

## 2015-07-28 NOTE — Care Management Important Message (Signed)
Important Message  Patient Details  Name: NORINE SKORUPSKI MRN: [id number] Date of Birth: 1932-03-20   Medicare Important Message Given:  Yes-fourth notification given    Kyla Balzarine 07/28/2015, 12:05 PM

## 2015-07-29 ENCOUNTER — Other Ambulatory Visit: Payer: Self-pay | Admitting: *Deleted

## 2015-07-29 ENCOUNTER — Telehealth: Payer: Self-pay | Admitting: *Deleted

## 2015-07-29 DIAGNOSIS — I4891 Unspecified atrial fibrillation: Secondary | ICD-10-CM | POA: Diagnosis not present

## 2015-07-29 DIAGNOSIS — I083 Combined rheumatic disorders of mitral, aortic and tricuspid valves: Secondary | ICD-10-CM | POA: Diagnosis not present

## 2015-07-29 DIAGNOSIS — I5023 Acute on chronic systolic (congestive) heart failure: Secondary | ICD-10-CM | POA: Diagnosis not present

## 2015-07-29 DIAGNOSIS — N183 Chronic kidney disease, stage 3 (moderate): Secondary | ICD-10-CM | POA: Diagnosis not present

## 2015-07-29 DIAGNOSIS — E1122 Type 2 diabetes mellitus with diabetic chronic kidney disease: Secondary | ICD-10-CM | POA: Diagnosis not present

## 2015-07-29 DIAGNOSIS — D649 Anemia, unspecified: Secondary | ICD-10-CM | POA: Diagnosis not present

## 2015-07-29 DIAGNOSIS — E1140 Type 2 diabetes mellitus with diabetic neuropathy, unspecified: Secondary | ICD-10-CM | POA: Diagnosis not present

## 2015-07-29 DIAGNOSIS — I2510 Atherosclerotic heart disease of native coronary artery without angina pectoris: Secondary | ICD-10-CM | POA: Diagnosis not present

## 2015-07-29 DIAGNOSIS — Z950 Presence of cardiac pacemaker: Secondary | ICD-10-CM | POA: Diagnosis not present

## 2015-07-29 DIAGNOSIS — I129 Hypertensive chronic kidney disease with stage 1 through stage 4 chronic kidney disease, or unspecified chronic kidney disease: Secondary | ICD-10-CM | POA: Diagnosis not present

## 2015-07-29 NOTE — Telephone Encounter (Signed)
[name] number not set up,LM on son's number to call me back-C.Les Pou RN

## 2015-07-29 NOTE — Telephone Encounter (Signed)
Pt daughter, Danielle Benitez returned Danielle Benitez's call.  Gave her the info per Danielle Benitez message per 07/29/2015-  Danielle Benitez said thanks for confirming....cdavis

## 2015-07-29 NOTE — Telephone Encounter (Signed)
Patient contacted regarding discharge from [facility]  on 07/28/15. Patient understands to follow up with provider Ronie Spies on 08/01/15 at 0930 at [facility] location. Patient understands discharge instructions? yes Patient understands medications and regiment? yes Patient understands to bring all medications to this visit? yes  Daughter Colin Mulders is who I spoke with,correct contact number is [phone]

## 2015-07-29 NOTE — Patient Outreach (Signed)
Transition of care (pt discharged 10/17): Spoke with pt, HIPPA verified.  Pt states had a spell early this am could not breath right, got out of bed, sat in the recliner, spell lasted an hour, got better sitting in recliner,coming from rattling.  Pt states to go to HF clinic 10/21. RN CM discussed with pt if it happens again, to call MD.  Pt states HH RN is coming tonight.   Pt states daughter lives with her,has and read her discharge papers, fixed her medications for her last night, this am and will be doing it again tonight.  Pt reports daughter is going to work on getting pt back to fixing her own medications by this weekend. Pt states her weight today was 178 lbs, has some swelling in feet/ankles/hands.  Pt states she had an appointment to see Dr. Chilton Si 10/20 but cancelled due to being in the hospital, did not reschedule, thinking about seeing a MD that is closer to her.  RN CM discussed with pt [facility] services- plan to f/u with weekly calls (31 days post day of discharge). Also discussed can receive a  home visit from a nurse to which pt agreed.    Plan to f/u with pt 10/25- home visit.     Shayne Alken.   Pierzchala RN CCM [facility] Care Management  [phone]

## 2015-07-29 NOTE — Telephone Encounter (Signed)
[name], daughter called and left message and stated that patient was just discharged from the hospital and Metformin is not on patient's medication list to take and wanted to know if she should still be taking this. I reviewed patient's chart and Discharge note dated 07/28/2015 stated that the Metformin was discontinued due to worsening renal functions and the Onglyza was decreased from 5mg  to 2.5mg .   Tried calling daughter back and LMOM to return call.

## 2015-07-29 NOTE — Telephone Encounter (Addendum)
Son told me to contact [employer] [phone]

## 2015-07-30 ENCOUNTER — Emergency Department (HOSPITAL_COMMUNITY): Payer: Medicare Other

## 2015-07-30 ENCOUNTER — Ambulatory Visit: Payer: Medicare Other | Admitting: Internal Medicine

## 2015-07-30 ENCOUNTER — Inpatient Hospital Stay (HOSPITAL_COMMUNITY)
Admission: EM | Admit: 2015-07-30 | Discharge: 2015-08-03 | DRG: 291 | Disposition: A | Discharge status: Home Health | Payer: Medicare Other | Attending: Cardiology | Admitting: Cardiology

## 2015-07-30 ENCOUNTER — Encounter (HOSPITAL_COMMUNITY): Payer: Self-pay | Admitting: Family Medicine

## 2015-07-30 ENCOUNTER — Telehealth: Payer: Self-pay | Admitting: Cardiology

## 2015-07-30 DIAGNOSIS — I5021 Acute systolic (congestive) heart failure: Secondary | ICD-10-CM

## 2015-07-30 DIAGNOSIS — Z833 Family history of diabetes mellitus: Secondary | ICD-10-CM

## 2015-07-30 DIAGNOSIS — M109 Gout, unspecified: Secondary | ICD-10-CM | POA: Diagnosis present

## 2015-07-30 DIAGNOSIS — F329 Major depressive disorder, single episode, unspecified: Secondary | ICD-10-CM | POA: Diagnosis not present

## 2015-07-30 DIAGNOSIS — N184 Chronic kidney disease, stage 4 (severe): Secondary | ICD-10-CM | POA: Diagnosis present

## 2015-07-30 DIAGNOSIS — E1122 Type 2 diabetes mellitus with diabetic chronic kidney disease: Secondary | ICD-10-CM | POA: Diagnosis present

## 2015-07-30 DIAGNOSIS — I482 Chronic atrial fibrillation: Secondary | ICD-10-CM | POA: Diagnosis not present

## 2015-07-30 DIAGNOSIS — N183 Chronic kidney disease, stage 3 unspecified: Secondary | ICD-10-CM

## 2015-07-30 DIAGNOSIS — E1121 Type 2 diabetes mellitus with diabetic nephropathy: Secondary | ICD-10-CM | POA: Diagnosis not present

## 2015-07-30 DIAGNOSIS — I10 Essential (primary) hypertension: Secondary | ICD-10-CM | POA: Diagnosis present

## 2015-07-30 DIAGNOSIS — I481 Persistent atrial fibrillation: Secondary | ICD-10-CM | POA: Diagnosis not present

## 2015-07-30 DIAGNOSIS — M790 Rheumatism, unspecified: Secondary | ICD-10-CM | POA: Diagnosis not present

## 2015-07-30 DIAGNOSIS — E1165 Type 2 diabetes mellitus with hyperglycemia: Secondary | ICD-10-CM | POA: Diagnosis present

## 2015-07-30 DIAGNOSIS — I110 Hypertensive heart disease with heart failure: Secondary | ICD-10-CM | POA: Diagnosis not present

## 2015-07-30 DIAGNOSIS — N390 Urinary tract infection, site not specified: Secondary | ICD-10-CM | POA: Diagnosis not present

## 2015-07-30 DIAGNOSIS — E785 Hyperlipidemia, unspecified: Secondary | ICD-10-CM | POA: Diagnosis present

## 2015-07-30 DIAGNOSIS — I130 Hypertensive heart and chronic kidney disease with heart failure and stage 1 through stage 4 chronic kidney disease, or unspecified chronic kidney disease: Principal | ICD-10-CM | POA: Diagnosis present

## 2015-07-30 DIAGNOSIS — Z888 Allergy status to other drugs, medicaments and biological substances status: Secondary | ICD-10-CM

## 2015-07-30 DIAGNOSIS — H9190 Unspecified hearing loss, unspecified ear: Secondary | ICD-10-CM | POA: Diagnosis not present

## 2015-07-30 DIAGNOSIS — I4891 Unspecified atrial fibrillation: Secondary | ICD-10-CM | POA: Diagnosis not present

## 2015-07-30 DIAGNOSIS — Z79899 Other long term (current) drug therapy: Secondary | ICD-10-CM

## 2015-07-30 DIAGNOSIS — Z79891 Long term (current) use of opiate analgesic: Secondary | ICD-10-CM

## 2015-07-30 DIAGNOSIS — F411 Generalized anxiety disorder: Secondary | ICD-10-CM | POA: Diagnosis present

## 2015-07-30 DIAGNOSIS — I480 Paroxysmal atrial fibrillation: Secondary | ICD-10-CM | POA: Diagnosis present

## 2015-07-30 DIAGNOSIS — I5023 Acute on chronic systolic (congestive) heart failure: Secondary | ICD-10-CM | POA: Diagnosis not present

## 2015-07-30 DIAGNOSIS — Z887 Allergy status to serum and vaccine status: Secondary | ICD-10-CM | POA: Diagnosis not present

## 2015-07-30 DIAGNOSIS — R0602 Shortness of breath: Secondary | ICD-10-CM

## 2015-07-30 DIAGNOSIS — I5022 Chronic systolic (congestive) heart failure: Secondary | ICD-10-CM | POA: Diagnosis present

## 2015-07-30 DIAGNOSIS — Z7901 Long term (current) use of anticoagulants: Secondary | ICD-10-CM | POA: Diagnosis not present

## 2015-07-30 DIAGNOSIS — E669 Obesity, unspecified: Secondary | ICD-10-CM | POA: Diagnosis present

## 2015-07-30 DIAGNOSIS — Z950 Presence of cardiac pacemaker: Secondary | ICD-10-CM

## 2015-07-30 DIAGNOSIS — D649 Anemia, unspecified: Secondary | ICD-10-CM | POA: Diagnosis present

## 2015-07-30 DIAGNOSIS — IMO0002 Reserved for concepts with insufficient information to code with codable children: Secondary | ICD-10-CM | POA: Diagnosis present

## 2015-07-30 DIAGNOSIS — E1129 Type 2 diabetes mellitus with other diabetic kidney complication: Secondary | ICD-10-CM | POA: Diagnosis present

## 2015-07-30 DIAGNOSIS — I429 Cardiomyopathy, unspecified: Secondary | ICD-10-CM | POA: Diagnosis present

## 2015-07-30 DIAGNOSIS — I2510 Atherosclerotic heart disease of native coronary artery without angina pectoris: Secondary | ICD-10-CM | POA: Diagnosis present

## 2015-07-30 DIAGNOSIS — I350 Nonrheumatic aortic (valve) stenosis: Secondary | ICD-10-CM

## 2015-07-30 DIAGNOSIS — I313 Pericardial effusion (noninflammatory): Secondary | ICD-10-CM

## 2015-07-30 DIAGNOSIS — I3139 Other pericardial effusion (noninflammatory): Secondary | ICD-10-CM

## 2015-07-30 HISTORY — DX: Long term (current) use of anticoagulants: Z79.01

## 2015-07-30 HISTORY — DX: Presence of cardiac pacemaker: Z95.0

## 2015-07-30 LAB — BASIC METABOLIC PANEL
Anion gap: 9 (ref 5–15)
BUN: 29 mg/dL — ABNORMAL HIGH (ref 6–20)
CHLORIDE: 100 mmol/L — AB (ref 101–111)
CO2: 24 mmol/L (ref 22–32)
CREATININE: 1.44 mg/dL — AB (ref 0.44–1.00)
Calcium: 9.2 mg/dL (ref 8.9–10.3)
GFR calc non Af Amer: 33 mL/min — ABNORMAL LOW (ref >60)
GFR, EST AFRICAN AMERICAN: 38 mL/min — AB (ref >60)
GLUCOSE: 187 mg/dL — AB (ref 65–99)
Potassium: 4.6 mmol/L (ref 3.5–5.1)
Sodium: 133 mmol/L — ABNORMAL LOW (ref 135–145)

## 2015-07-30 LAB — CBC
HCT: 31.3 % — ABNORMAL LOW (ref 36.0–46.0)
Hemoglobin: 9.4 g/dL — ABNORMAL LOW (ref 12.0–15.0)
MCH: 24.7 pg — AB (ref 26.0–34.0)
MCHC: 30 g/dL (ref 30.0–36.0)
MCV: 82.2 fL (ref 78.0–100.0)
PLATELETS: 218 10*3/uL (ref 150–400)
RBC: 3.81 MIL/uL — AB (ref 3.87–5.11)
RDW: 22 % — ABNORMAL HIGH (ref 11.5–15.5)
WBC: 6.2 10*3/uL (ref 4.0–10.5)

## 2015-07-30 LAB — I-STAT TROPONIN, ED: Troponin i, poc: 0.02 ng/mL (ref 0.00–0.08)

## 2015-07-30 LAB — BRAIN NATRIURETIC PEPTIDE: B Natriuretic Peptide: 3279.3 pg/mL — ABNORMAL HIGH (ref 0.0–100.0)

## 2015-07-30 MED ORDER — ATORVASTATIN CALCIUM 20 MG PO TABS
20.0000 mg | ORAL_TABLET | Freq: Every day | ORAL | Status: DC
  Administered 2015-07-30 – 2015-08-02 (×4): 20 mg via ORAL
  Filled 2015-07-30 (×4): qty 1

## 2015-07-30 MED ORDER — SODIUM CHLORIDE 0.9 % IV SOLN: 250.0000 mL | INTRAVENOUS | Status: DC | PRN

## 2015-07-30 MED ORDER — ALLOPURINOL 300 MG PO TABS
300.0000 mg | ORAL_TABLET | Freq: Every day | ORAL | Status: DC
  Administered 2015-07-31 – 2015-08-03 (×4): 300 mg via ORAL
  Filled 2015-07-30 (×4): qty 1

## 2015-07-30 MED ORDER — ASPIRIN 81 MG PO CHEW: 81.0000 mg | CHEWABLE_TABLET | Freq: Every day | ORAL | Status: DC

## 2015-07-30 MED ORDER — SODIUM CHLORIDE 0.9 % IJ SOLN
3.0000 mL | Freq: Two times a day (BID) | INTRAMUSCULAR | Status: DC
  Administered 2015-07-30 – 2015-08-03 (×7): 3 mL via INTRAVENOUS

## 2015-07-30 MED ORDER — FUROSEMIDE 10 MG/ML IJ SOLN
40.0000 mg | Freq: Once | INTRAMUSCULAR | Status: AC
  Administered 2015-07-30: 40 mg via INTRAVENOUS
  Filled 2015-07-30: qty 4

## 2015-07-30 MED ORDER — DULOXETINE HCL 20 MG PO CPEP
20.0000 mg | ORAL_CAPSULE | Freq: Every day | ORAL | Status: DC
  Administered 2015-07-30 – 2015-08-02 (×4): 20 mg via ORAL
  Filled 2015-07-30 (×6): qty 1

## 2015-07-30 MED ORDER — PANTOPRAZOLE SODIUM 40 MG PO TBEC
40.0000 mg | DELAYED_RELEASE_TABLET | Freq: Every day | ORAL | Status: DC
  Administered 2015-07-31 – 2015-08-03 (×4): 40 mg via ORAL
  Filled 2015-07-30 (×4): qty 1

## 2015-07-30 MED ORDER — HYDROCODONE-ACETAMINOPHEN 5-325 MG PO TABS
1.0000 | ORAL_TABLET | Freq: Four times a day (QID) | ORAL | Status: DC | PRN
  Administered 2015-07-31 – 2015-08-03 (×3): 1 via ORAL
  Filled 2015-07-30 (×3): qty 1

## 2015-07-30 MED ORDER — APIXABAN 2.5 MG PO TABS
2.5000 mg | ORAL_TABLET | Freq: Two times a day (BID) | ORAL | Status: DC
  Administered 2015-07-30 – 2015-08-03 (×8): 2.5 mg via ORAL
  Filled 2015-07-30 (×8): qty 1

## 2015-07-30 MED ORDER — ONDANSETRON HCL 4 MG/2ML IJ SOLN: 4.0000 mg | Freq: Four times a day (QID) | INTRAMUSCULAR | Status: DC | PRN

## 2015-07-30 MED ORDER — INSULIN ASPART 100 UNIT/ML ~~LOC~~ SOLN
0.0000 [IU] | Freq: Three times a day (TID) | SUBCUTANEOUS | Status: DC
  Administered 2015-07-31: 5 [IU] via SUBCUTANEOUS
  Administered 2015-08-01: 3 [IU] via SUBCUTANEOUS
  Administered 2015-08-01: 2 [IU] via SUBCUTANEOUS
  Administered 2015-08-01 – 2015-08-02 (×4): 3 [IU] via SUBCUTANEOUS
  Administered 2015-08-03: 5 [IU] via SUBCUTANEOUS
  Administered 2015-08-03: 3 [IU] via SUBCUTANEOUS

## 2015-07-30 MED ORDER — SODIUM CHLORIDE 0.9 % IJ SOLN: 3.0000 mL | INTRAMUSCULAR | Status: DC | PRN

## 2015-07-30 MED ORDER — METOPROLOL TARTRATE 25 MG PO TABS
25.0000 mg | ORAL_TABLET | Freq: Two times a day (BID) | ORAL | Status: DC
  Administered 2015-07-30 – 2015-08-01 (×4): 25 mg via ORAL
  Filled 2015-07-30 (×6): qty 1

## 2015-07-30 MED ORDER — MONTELUKAST SODIUM 10 MG PO TABS
10.0000 mg | ORAL_TABLET | Freq: Every day | ORAL | Status: DC
  Administered 2015-07-30 – 2015-08-02 (×4): 10 mg via ORAL
  Filled 2015-07-30 (×4): qty 1

## 2015-07-30 MED ORDER — POLYSACCHARIDE IRON COMPLEX 150 MG PO CAPS
150.0000 mg | ORAL_CAPSULE | Freq: Two times a day (BID) | ORAL | Status: DC
  Administered 2015-07-30 – 2015-08-03 (×8): 150 mg via ORAL
  Filled 2015-07-30 (×8): qty 1

## 2015-07-30 MED ORDER — LISINOPRIL 10 MG PO TABS
10.0000 mg | ORAL_TABLET | Freq: Every day | ORAL | Status: DC
  Administered 2015-07-31: 10 mg via ORAL
  Filled 2015-07-30 (×3): qty 1

## 2015-07-30 MED ORDER — ALBUTEROL SULFATE (2.5 MG/3ML) 0.083% IN NEBU: 2.5000 mg | INHALATION_SOLUTION | Freq: Four times a day (QID) | RESPIRATORY_TRACT | Status: DC | PRN

## 2015-07-30 MED ORDER — SENNOSIDES-DOCUSATE SODIUM 8.6-50 MG PO TABS: 1.0000 | ORAL_TABLET | Freq: Every day | ORAL | Status: DC | PRN

## 2015-07-30 MED ORDER — ACETAMINOPHEN 325 MG PO TABS: 650.0000 mg | ORAL_TABLET | ORAL | Status: DC | PRN

## 2015-07-30 NOTE — ED Notes (Signed)
Cards at bedside

## 2015-07-30 NOTE — Telephone Encounter (Signed)
New problem   Pt has gained 2 lbs in 2 days per daughter calling and wish to speak to nurse.

## 2015-07-30 NOTE — Telephone Encounter (Signed)
Spoke with pt and daughter, pt was discharged on Monday and she has gained 2 lbs in 2 days. She has woke up for the last 3 nights SOB and unable to go back to sleep. She is SOB with exertion through the home and has a little edema in her legs. The pt reports weakness and feeling bad. She has a rattling and cough of white sputum. Her bp last night by the [facility] was 120/60. Discussed with dayna Fleagle pa, pt advised to return to the ER for evaluation. Pt and dtr aware of recommendations. Hospital team made aware.

## 2015-07-30 NOTE — ED Provider Notes (Addendum)
CSN: [id number]     Arrival date & time 07/30/15  1039 History   First MD Initiated Contact with Patient 07/30/15 1042     Chief Complaint  Patient presents with  . Chest Pain  . Shortness of Breath     (Consider location/radiation/quality/duration/timing/severity/associated sxs/prior Treatment) HPI Comments: Patient presents to the ER for evaluation of shortness of breath. Patient was recently hospitalized for atrial fibrillation with rapid ventricular response and congestive heart failure. She underwent cardioversion during her hospitalization. Patient went home from the hospital 2 days ago. Since getting home, however, she has had progressive worsening shortness of breath. Patient reports severe dyspnea on exertion and now is experiencing orthopnea and paroxysmal nocturnal dyspnea. She was unable to sleep last night because of difficulty breathing.  Patient is a 81 y.o. female presenting with chest pain and shortness of breath.  Chest Pain Associated symptoms: shortness of breath   Shortness of Breath   Past Medical History  Diagnosis Date  . Gout   . Osteoarthritis   . Bursitis   . S/P cardiac pacemaker procedure, PPM [employer] REVO placed 01/11/2012 01/11/2012    a. for symptomatic bradycardia. Followed by Dr. Ladona Ridgel.  . Personal history of fall   . Chronic kidney disease, stage II (mild)   . Memory loss   . Anxiety state, unspecified   . Other and unspecified hyperlipidemia   . Anemia, unspecified   . Abnormality of gait   . Depressive disorder, not elsewhere classified   . Rheumatism, unspecified and fibrositis   . Lumbago   . Nonspecific abnormal results of liver function study   . Obesity, unspecified   . Internal hemorrhoids without mention of complication   . Viremia, unspecified   . Osteoarthrosis, unspecified whether generalized or localized, unspecified site   . Abnormality of gait   . Viremia, unspecified   . Rheumatism, unspecified and fibrositis   . Pain in  joint, shoulder region 11/22/2013    leeft   . Pruritus 11/22/2013  . Eczema 11/22/2013  . Full dentures   . HOH (hard of hearing)   . Wears glasses   . Female stress incontinence 05/06/2015  . DM (diabetes mellitus), type 2, uncontrolled, with renal complications (HCC) 01/08/2012  . CKD (chronic kidney disease), stage III   . Aortic stenosis     a. Mild by echo 08/2013.  . Essential hypertension   . Orthostatic hypotension   . Bifascicular block   . Atrial fibrillation (HCC)   . Chronic systolic CHF (congestive heart failure) (HCC)     a. Dx 07/2015 - EF 35-40%, unclear etiology  . Mild CAD     a. Cath 2013: 20-30% prox RCA.   Past Surgical History  Procedure Laterality Date  . Extremity cyst excision    . Cardiac catherterization  12/03/2003    Dr Clarene Duke  . Pacemaker placement  2013  . Colonoscopy    . Orif wrist fracture Right 04/09/2014    Procedure: OPEN REDUCTION INTERNAL FIXATION (ORIF) RIGHT DISPLACED  DISTAL RADIUS  FRACTURE;  Surgeon: Dominica Severin, MD;  Location: [facility];  Service: Orthopedics;  Laterality: Right;  . Left heart catheterization with coronary angiogram N/A 01/10/2012    Procedure: LEFT HEART CATHETERIZATION WITH CORONARY ANGIOGRAM;  Surgeon: Chrystie Nose, MD;  Location: [facility] CATH LAB;  Service: Cardiovascular;  Laterality: N/A;  . Permanent pacemaker insertion Left 01/11/2012    Procedure: PERMANENT PACEMAKER INSERTION;  Surgeon: Thurmon Fair, MD;  Location: [facility] CATH LAB;  Service: Cardiovascular;  Laterality: Left;  . Tee without cardioversion N/A 07/25/2015    Procedure: TRANSESOPHAGEAL ECHOCARDIOGRAM (TEE);  Surgeon: Pricilla Riffle, MD;  Location: [facility] ENDOSCOPY;  Service: Cardiovascular;  Laterality: N/A;  . Cardioversion N/A 07/25/2015    Procedure: CARDIOVERSION;  Surgeon: Pricilla Riffle, MD;  Location: [facility] ENDOSCOPY;  Service: Cardiovascular;  Laterality: N/A;   Family History  Problem Relation Age of Onset  . Breast cancer Mother   .  Cancer Mother   . Diabetes Daughter   . Hypertension Daughter   . Cancer Brother    Social History  Substance Use Topics  . Smoking status: Never Smoker   . Smokeless tobacco: None  . Alcohol Use: No   OB History    No data available     Review of Systems  Respiratory: Positive for shortness of breath.   All other systems reviewed and are negative.     Allergies  Beta adrenergic blockers; Jardiance; Fluzone; and Tetanus toxoids  Home Medications   Prior to Admission medications   Medication Sig Start Date End Date Taking? Authorizing Provider  albuterol (PROVENTIL HFA;VENTOLIN HFA) 108 (90 BASE) MCG/ACT inhaler Inhale 2 puffs into the lungs every 6 (six) hours as needed for wheezing or shortness of breath. 06/19/15   Sharon Seller, NP  allopurinol (ZYLOPRIM) 300 MG tablet Take 1 tablet (300 mg total) by mouth daily. 06/20/15   Kimber Relic, MD  apixaban (ELIQUIS) 2.5 MG TABS tablet Take 1 tablet (2.5 mg total) by mouth 2 (two) times daily. 07/28/15   Joline Salt Barrett, PA-C  aspirin 81 MG chewable tablet Chew 81 mg by mouth daily.    Historical Provider, MD  atorvastatin (LIPITOR) 40 MG tablet Take 0.5 tablets (20 mg total) by mouth daily at 6 PM. 06/20/15 06/25/16  Kimber Relic, MD  DULoxetine (CYMBALTA) 20 MG capsule Take 1 tablet by mouth daily to help treat anxiety and nerves. 06/20/15   Kimber Relic, MD  HYDROcodone-acetaminophen (NORCO/VICODIN) 5-325 MG tablet Take 1 tablet by mouth every 6 (six) hours as needed for moderate pain (joint pain).    Historical Provider, MD  iron polysaccharides (NU-IRON) 150 MG capsule Take 1 capsule (150 mg total) by mouth 2 (two) times daily. 07/28/15   Rhonda G Barrett, PA-C  lisinopril (PRINIVIL,ZESTRIL) 20 MG tablet Take 1/2 tablet by mouth once daily for blood pressure 07/17/15   Dayna N Andes, PA-C  metoprolol tartrate (LOPRESSOR) 25 MG tablet Take 1 tablet (25 mg total) by mouth 2 (two) times daily. 07/17/15   Dayna N Para, PA-C   montelukast (SINGULAIR) 10 MG tablet Take 1 tablet (10 mg total) by mouth at bedtime. 06/19/15   Sharon Seller, NP  [facility] DELICA LANCETS FINE MISC Check blood sugar daily as directed DX E11.65 12/18/14   Kimber Relic, MD  pantoprazole (PROTONIX) 40 MG tablet Take 1 tablet (40 mg total) by mouth daily. 06/19/15   Sharon Seller, NP  saxagliptin HCl (ONGLYZA) 2.5 MG TABS tablet Take 1 tablet (2.5 mg total) by mouth daily. Take one tablet by mouth once daily for blood sugar 07/28/15   Rhonda G Barrett, PA-C  senna-docusate (SENOKOT-S) 8.6-50 MG tablet Take 1 tablet by mouth daily as needed for mild constipation. 07/28/15   Rhonda G Barrett, PA-C  triamcinolone (KENALOG) 0.025 % cream Apply 1 application topically 4 (four) times daily as needed (itching).    Historical Provider, MD   BP 108/78 mmHg  Pulse 86  Temp(Src) 97.5 F (36.4 C) (Oral)  Resp 17  Wt 180 lb (81.647 kg)  SpO2 100% Physical Exam  Constitutional: She is oriented to person, place, and time. She appears well-developed and well-nourished. No distress.  HENT:  Head: Normocephalic and atraumatic.  Right Ear: Hearing normal.  Left Ear: Hearing normal.  Nose: Nose normal.  Mouth/Throat: Oropharynx is clear and moist and mucous membranes are normal.  Eyes: Conjunctivae and EOM are normal. Pupils are equal, round, and reactive to light.  Neck: Normal range of motion. Neck supple.  Cardiovascular: S1 normal and S2 normal.  An irregularly irregular rhythm present. Exam reveals no gallop and no friction rub.   No murmur heard. Pulmonary/Chest: Effort normal and breath sounds normal. No respiratory distress. She exhibits no tenderness.  Abdominal: Soft. Normal appearance and bowel sounds are normal. There is no hepatosplenomegaly. There is no tenderness. There is no rebound, no guarding, no tenderness at McBurney's point and negative Murphy's sign. No hernia.  Musculoskeletal: Normal range of motion. She exhibits edema.   Neurological: She is alert and oriented to person, place, and time. She has normal strength. No cranial nerve deficit or sensory deficit. Coordination normal. GCS eye subscore is 4. GCS verbal subscore is 5. GCS motor subscore is 6.  Skin: Skin is warm, dry and intact. No rash noted. No cyanosis.  Psychiatric: She has a normal mood and affect. Her speech is normal and behavior is normal. Thought content normal.  Nursing note and vitals reviewed.   ED Course  Procedures (including critical care time) Labs Review Labs Reviewed  BASIC METABOLIC PANEL - Abnormal; Notable for the following:    Sodium 133 (*)    Chloride 100 (*)    Glucose, Bld 187 (*)    BUN 29 (*)    Creatinine, Ser 1.44 (*)    GFR calc non Af Amer 33 (*)    GFR calc Af Amer 38 (*)    All other components within normal limits  CBC - Abnormal; Notable for the following:    RBC 3.81 (*)    Hemoglobin 9.4 (*)    HCT 31.3 (*)    MCH 24.7 (*)    RDW 22.0 (*)    All other components within normal limits  BRAIN NATRIURETIC PEPTIDE - Abnormal; Notable for the following:    B Natriuretic Peptide 3279.3 (*)    All other components within normal limits  I-STAT TROPOININ, ED    Imaging Review Dg Chest 2 View  07/30/2015  CLINICAL DATA:  Shortness of breath EXAM: CHEST  2 VIEW COMPARISON:  07/28/2015 chest radiograph FINDINGS: Do lead left subclavian pacemaker is stable in configuration with lead tips overlying the right atrium and right ventricle. Stable cardiomediastinal silhouette with mild-to-moderate cardiomegaly. Stable trace bilateral pleural effusions. No pneumothorax. Mild pulmonary edema, which is worsened. No focal lung consolidation. IMPRESSION: 1. Mild congestive heart failure, worsened. 2. Stable trace bilateral pleural effusions. Electronically Signed   By: Delbert Phenix M.D.   On: 07/30/2015 11:53   I have personally reviewed and evaluated these images and lab results as part of my medical decision-making.    EKG Interpretation   Date/Time:  Wednesday July 30 2015 10:46:39 EDT Ventricular Rate:  86 PR Interval:  190 QRS Duration: 153 QT Interval:  379 QTC Calculation: 453 R Axis:   -73 Text Interpretation:  Atrial fibrillation Right bundle branch block No  significant change since last tracing other than slower rate Confirmed by  Sava Proby  MD,  Meleena Munroe [phone]) on 07/30/2015 11:01:40 AM      MDM   Final diagnoses:  Acute on chronic systolic (congestive) heart failure (HCC)  Paroxysmal atrial fibrillation (HCC)    Patient presents to the ER for evaluation of worsening shortness of breath. Patient was recently hospitalized for atrial fibrillation with rapid ventricular response and acute congestive heart failure. She had cardioversion during her hospital stay. She was in sinus rhythm at discharge 2 days ago. She returns now with evidence of congestive heart failure and recurrent atrial fibrillation with rate control. She will require repeat hospitalization for further management.  Addendum: Report from [employer] reveals that interrogation of the pacemaker shows that she went back into atrial fibrillation around 3 AM this morning.  Gilda Crease, MD 07/30/15 1227  Gilda Crease, MD 07/30/15 1236

## 2015-07-30 NOTE — H&P (Signed)
Patient ID: Danielle Benitez MRN: [id number], DOB/AGE: 1931-11-16   Admit date: 07/30/2015   Primary Physician: Kimber Relic, MD Primary Cardiologist: Dr. Jens Som   Danielle Benitez is a 81 y.o. female with past medical history of chronic systolic CHF, HTN, HLD, orthostatic hypotension, DM, mild aortic stenosis, CKD stage III, symptomatic bradycardia (s/p PPM 2013), RBBB, and LAFB presenting to [facility] ED on 07/30/2015 for worsening shortness of breath and 2 pound weight gain since being discharged from the hospital on 07/28/2015.  The patient was recently admitted from 07/18/2015 - 07/28/2015 for acute on chronic systolic CHF and atrial fibrillation. She had been in atrial fibrillation since 07/11/2015 and had a TEE/DCCV on 07/25/2015 which was successful with return to atrial paced rhythm. She remained on Eliquis 2.5mg  BID at discharge for anticoagulation and Metoprolol 25mg  BID for rate control. As for her heart failure, she weighed 188 pounds at discharge and was not on oral lasix at that time with the plans to restart Lasix at her hospital follow-up appointment. She never required IV Lasix during that hospitalization.  During that admission, she was also noted to have anemia with hemoglobin of 8.7. She was evaluated by GI and instructed to follow-up with [facility] at discharge. She also had acute on chronic kidney injury with max creatinine at 3.41 with a baseline of 1.0-1.4. Creatinine had improved at 1.73 at time of discharge and she remained off Metformin due to potential nephrotoxic effects and off of Spironolactone due to having hyperkalemia at time of admission.  Reports she has had orthopnea (using 3 pillows at night), PND, and dyspnea ever since the evening of 07/28/2015. Her weight has went up on her home scale by two pounds over the past two days. Her daughter reports her face "looks more swollen" than previously. She had been using a walker at home for ambulation but  reports feeling very fatigued and weak from her last hospitalization.  Upon presenting to the ED today, she was noted to be back in atrial fibrillation. Medtronic device interrogation showed she went into atrial fibrillation this morning at 0300. She reports having no palpitations or chest pressure since being discharged and noted having dyspnea this morning at that time, but says it was similar to what she had been experiencing for the past few days.  While in the ED, her BNP was found to be elevated to 3279 (previously 1616 on 07/28/2015). Hgb stable at 9.4. Creatinine improved to 1.44 (1.73 at recent discharge). CXR showed mild congestive heart failure, worsened since previous imaging and stable bilateral pleural effusions.   Problem List  Past Medical History  Diagnosis Date  . Gout   . Osteoarthritis   . Bursitis   . S/P cardiac pacemaker procedure, PPM Medtronic REVO placed 01/11/2012 01/11/2012    a. for symptomatic bradycardia. Followed by Dr. Ladona Ridgel.  . Personal history of fall   . Chronic kidney disease, stage II (mild)   . Memory loss   . Anxiety state, unspecified   . Other and unspecified hyperlipidemia   . Anemia, unspecified   . Abnormality of gait   . Depressive disorder, not elsewhere classified   . Rheumatism, unspecified and fibrositis   . Lumbago   . Nonspecific abnormal results of liver function study   . Obesity, unspecified   . Internal hemorrhoids without mention of complication   . Viremia, unspecified   . Osteoarthrosis, unspecified whether generalized or localized, unspecified site   . Abnormality of gait   .  Viremia, unspecified   . Rheumatism, unspecified and fibrositis   . Pain in joint, shoulder region 11/22/2013    leeft   . Pruritus 11/22/2013  . Eczema 11/22/2013  . Full dentures   . HOH (hard of hearing)   . Wears glasses   . Female stress incontinence 05/06/2015  . DM (diabetes mellitus), type 2, uncontrolled, with renal complications (HCC)  01/08/2012  . CKD (chronic kidney disease), stage III   . Aortic stenosis     a. Mild by echo 08/2013.  . Essential hypertension   . Orthostatic hypotension   . Bifascicular block   . Atrial fibrillation (HCC)   . Chronic systolic CHF (congestive heart failure) (HCC)     a. Dx 07/2015 - EF 35-40%, unclear etiology  . Mild CAD     a. Cath 2013: 20-30% prox RCA.    Past Surgical History  Procedure Laterality Date  . Extremity cyst excision    . Cardiac catherterization  12/03/2003    Dr Clarene Duke  . Pacemaker placement  2013  . Colonoscopy    . Orif wrist fracture Right 04/09/2014    Procedure: OPEN REDUCTION INTERNAL FIXATION (ORIF) RIGHT DISPLACED  DISTAL RADIUS  FRACTURE;  Surgeon: Dominica Severin, MD;  Location: [facility];  Service: Orthopedics;  Laterality: Right;  . Left heart catheterization with coronary angiogram N/A 01/10/2012    Procedure: LEFT HEART CATHETERIZATION WITH CORONARY ANGIOGRAM;  Surgeon: Chrystie Nose, MD;  Location: [facility] CATH LAB;  Service: Cardiovascular;  Laterality: N/A;  . Permanent pacemaker insertion Left 01/11/2012    Procedure: PERMANENT PACEMAKER INSERTION;  Surgeon: Thurmon Fair, MD;  Location: [facility] CATH LAB;  Service: Cardiovascular;  Laterality: Left;  . Tee without cardioversion N/A 07/25/2015    Procedure: TRANSESOPHAGEAL ECHOCARDIOGRAM (TEE);  Surgeon: Pricilla Riffle, MD;  Location: [facility] ENDOSCOPY;  Service: Cardiovascular;  Laterality: N/A;  . Cardioversion N/A 07/25/2015    Procedure: CARDIOVERSION;  Surgeon: Pricilla Riffle, MD;  Location: [facility] ENDOSCOPY;  Service: Cardiovascular;  Laterality: N/A;      Home Medications  Prior to Admission medications   Medication Sig Start Date End Date Taking? Authorizing Provider  albuterol (PROVENTIL HFA;VENTOLIN HFA) 108 (90 BASE) MCG/ACT inhaler Inhale 2 puffs into the lungs every 6 (six) hours as needed for wheezing or shortness of breath. 06/19/15   Sharon Seller, NP  allopurinol (ZYLOPRIM) 300 MG  tablet Take 1 tablet (300 mg total) by mouth daily. 06/20/15   Kimber Relic, MD  apixaban (ELIQUIS) 2.5 MG TABS tablet Take 1 tablet (2.5 mg total) by mouth 2 (two) times daily. 07/28/15   Joline Salt Barrett, PA-C  aspirin 81 MG chewable tablet Chew 81 mg by mouth daily.    Historical Provider, MD  atorvastatin (LIPITOR) 40 MG tablet Take 0.5 tablets (20 mg total) by mouth daily at 6 PM. 06/20/15 06/25/16  Kimber Relic, MD  DULoxetine (CYMBALTA) 20 MG capsule Take 1 tablet by mouth daily to help treat anxiety and nerves. 06/20/15   Kimber Relic, MD  HYDROcodone-acetaminophen (NORCO/VICODIN) 5-325 MG tablet Take 1 tablet by mouth every 6 (six) hours as needed for moderate pain (joint pain).    Historical Provider, MD  iron polysaccharides (NU-IRON) 150 MG capsule Take 1 capsule (150 mg total) by mouth 2 (two) times daily. 07/28/15   Joline Salt Barrett, PA-C  lisinopril (PRINIVIL,ZESTRIL) 20 MG tablet Take 1/2 tablet by mouth once daily for blood pressure 07/17/15   Laurann Montana, PA-C  metoprolol tartrate (LOPRESSOR) 25 MG tablet Take 1 tablet (25 mg total) by mouth 2 (two) times daily. 07/17/15   Dayna N Vandemark, PA-C  montelukast (SINGULAIR) 10 MG tablet Take 1 tablet (10 mg total) by mouth at bedtime. 06/19/15   Sharon Seller, NP  [facility] DELICA LANCETS FINE MISC Check blood sugar daily as directed DX E11.65 12/18/14   Kimber Relic, MD  pantoprazole (PROTONIX) 40 MG tablet Take 1 tablet (40 mg total) by mouth daily. 06/19/15   Sharon Seller, NP  saxagliptin HCl (ONGLYZA) 2.5 MG TABS tablet Take 1 tablet (2.5 mg total) by mouth daily. Take one tablet by mouth once daily for blood sugar 07/28/15   Rhonda G Barrett, PA-C  senna-docusate (SENOKOT-S) 8.6-50 MG tablet Take 1 tablet by mouth daily as needed for mild constipation. 07/28/15   Rhonda G Barrett, PA-C  triamcinolone (KENALOG) 0.025 % cream Apply 1 application topically 4 (four) times daily as needed (itching).    Historical Provider, MD     Allergies Allergies  Allergen Reactions  . Beta Adrenergic Blockers     Couldn't speak or hear  . Jardiance [Empagliflozin]     Rash, kidney issues  . Fluzone [Flu Virus Vaccine] Other (See Comments)    Feeling unwell for a week. Cold symptoms/flu symtoms  . Tetanus Toxoids     UNKNOWN    Past Medical History Past Medical History  Diagnosis Date  . Gout   . Osteoarthritis   . Bursitis   . S/P cardiac pacemaker procedure, PPM [employer]tronic REVO placed 01/11/2012 01/11/2012    a. for symptomatic bradycardia. Followed by Dr. Ladona Ridgel.  . Personal history of fall   . Chronic kidney disease, stage II (mild)   . Memory loss   . Anxiety state, unspecified   . Other and unspecified hyperlipidemia   . Anemia, unspecified   . Abnormality of gait   . Depressive disorder, not elsewhere classified   . Rheumatism, unspecified and fibrositis   . Lumbago   . Nonspecific abnormal results of liver function study   . Obesity, unspecified   . Internal hemorrhoids without mention of complication   . Viremia, unspecified   . Osteoarthrosis, unspecified whether generalized or localized, unspecified site   . Abnormality of gait   . Viremia, unspecified   . Rheumatism, unspecified and fibrositis   . Pain in joint, shoulder region 11/22/2013    leeft   . Pruritus 11/22/2013  . Eczema 11/22/2013  . Full dentures   . HOH (hard of hearing)   . Wears glasses   . Female stress incontinence 05/06/2015  . DM (diabetes mellitus), type 2, uncontrolled, with renal complications (HCC) 01/08/2012  . CKD (chronic kidney disease), stage III   . Aortic stenosis     a. Mild by echo 08/2013.  . Essential hypertension   . Orthostatic hypotension   . Bifascicular block   . Atrial fibrillation (HCC)   . Chronic systolic CHF (congestive heart failure) (HCC)     a. Dx 07/2015 - EF 35-40%, unclear etiology  . Mild CAD     a. Cath 2013: 20-30% prox RCA.     Surgical History   Past Surgical History  Procedure  Laterality Date  . Extremity cyst excision    . Cardiac catherterization  12/03/2003    Dr Clarene Duke  . Pacemaker placement  2013  . Colonoscopy    . Orif wrist fracture Right 04/09/2014    Procedure: OPEN REDUCTION INTERNAL FIXATION (ORIF) RIGHT  DISPLACED  DISTAL RADIUS  FRACTURE;  Surgeon: Dominica Severin, MD;  Location:  SUR[facility]ERY CENTER;  Service: Orthopedics;  Laterality: Right;  . Left heart catheterization with coronary angiogram N/A 01/10/2012    Procedure: LEFT HEART CATHETERIZATION WITH CORONARY ANGIOGRAM;  Surgeon: Chrystie Nose, MD;  Location: [facility] CATH LAB;  Service: Cardiovascular;  Laterality: N/A;  . Permanent pacemaker insertion Left 01/11/2012    Procedure: PERMANENT PACEMAKER INSERTION;  Surgeon: Thurmon Fair, MD;  Location: [facility] CATH LAB;  Service: Cardiovascular;  Laterality: Left;  . Tee without cardioversion N/A 07/25/2015    Procedure: TRANSESOPHAGEAL ECHOCARDIOGRAM (TEE);  Surgeon: Pricilla Riffle, MD;  Location: [facility] ENDOSCOPY;  Service: Cardiovascular;  Laterality: N/A;  . Cardioversion N/A 07/25/2015    Procedure: CARDIOVERSION;  Surgeon: Pricilla Riffle, MD;  Location: [facility] ENDOSCOPY;  Service: Cardiovascular;  Laterality: N/A;     Family History  Family History  Problem Relation Age of Onset  . Breast cancer Mother   . Cancer Mother   . Diabetes Daughter   . Hypertension Daughter   . Cancer Brother     Social History  Social History   Social History  . Marital Status: Widowed    Spouse Name: N/A  . Number of Children: N/A  . Years of Education: N/A   Occupational History  . Not on file.   Social History Main Topics  . Smoking status: Never Smoker   . Smokeless tobacco: Not on file  . Alcohol Use: No  . Drug Use: No  . Sexual Activity: No   Other Topics Concern  . Not on file   Social History Narrative   Physically inactive. She says she hurts in her back and knees too much to do much walking.     Review of Systems General:  No chills, fever,  night sweats or weight changes. Positive for decreased appetite and fatigue. Cardiovascular:  No chest pain or palpitations. Positive for dyspnea on exertion, edema, orthopnea, and paroxysmal nocturnal dyspnea. Dermatological: No rash, lesions/masses Respiratory: No cough, Positive for dyspnea Urologic: No hematuria, dysuria Abdominal:   No nausea, vomiting, diarrhea, bright red blood per rectum, melena, or hematemesis Neurologic:  No visual changes, wkns, changes in mental status. All other systems reviewed and are otherwise negative except as noted above.   Physical Exam: Blood pressure 114/68, pulse 86, temperature 97.5 F (36.4 C), temperature source Oral, resp. rate 16, weight 181 lb 14.4 oz (82.509 kg), SpO2 100 %.  General: Well developed, elderly Caucasian ,female in no acute distress. Head: Normocephalic, atraumatic, sclera non-icteric, no xanthomas, nares are without discharge. Dentition:  Neck: No carotid bruits. JVD elevated.  Lungs: Respirations regular and unlabored.  Rales at bases bilaterally. Heart: Irregularly irregular. No S3 or S4.  No murmur, no rubs, or gallops appreciated. Abdomen: Soft, non-tender, non-distended with normoactive bowel sounds. No hepatomegaly. No rebound/guarding. No obvious abdominal masses. Msk:  Strength and tone appear normal for age. No joint deformities or effusions. Extremities: No clubbing or cyanosis. 1+ edema bilaterally.  Distal pedal pulses are 2+ bilaterally. Neuro: Alert and oriented X 3. Moves all extremities spontaneously. No focal deficits noted. Psych:  Responds to questions appropriately with a normal affect. Skin: No rashes or lesions noted  Labs Lab Results  Component Value Date   WBC 6.2 07/30/2015   HGB 9.4* 07/30/2015   HCT 31.3* 07/30/2015   MCV 82.2 07/30/2015   PLT 218 07/30/2015    Recent Labs Lab 07/30/15 1109  NA 133*  K  4.6  CL 100*  CO2 24  BUN 29*  CREATININE 1.44*  CALCIUM 9.2  GLUCOSE 187*    Troponin (Point of Care Test)  Recent Labs  07/30/15 1116  TROPIPOC 0.02    Lab Results  Component Value Date   CHOL 115 04/28/2015   HDL 55 04/28/2015   LDLCALC 48 04/28/2015   TRIG 61 04/28/2015   B NATRIURETIC PEPTIDE  Date/Time Value Ref Range Status  07/30/2015 11:09 AM 3279.3* 0.0 - 100.0 pg/mL Final  07/28/2015 11:36 AM 1616.8* 0.0 - 100.0 pg/mL Final  07/23/2015 10:10 AM 2816.9* 0.0 - 100.0 pg/mL Final    ECG Atrial fibrillation with rate in 80's. Old RBBB noted.  Echo: 07/17/2015 Study Conclusions - Left ventricle: The cavity size was moderately dilated. Systolic function was moderately reduced. The estimated ejection fraction was in the range of 35% to 40%. Wall motion was normal; there were no regional wall motion abnormalities. - Aortic valve: Trileaflet; normal thickness, severely calcified leaflets. Valve mobility was restricted. There was mild stenosis. Peak velocity (S): 179.09 cm/s. Mean velocity (S): 126.36 cm/s. Mean gradient (S): 7 mm Hg. VTI ratio of LVOT to aortic valve: 0.37. Valve area (VTI): 1.16 cm^2. Valve area (Vmean): 1.02 cm^2. - Mitral valve: Calcified annulus. There was moderate regurgitation directed centrally. - Left atrium: The atrium was severely dilated. - Tricuspid valve: There was moderate regurgitation. - Pulmonary arteries: PA peak pressure: 48 mm Hg (S). - Pericardium, extracardiac: A small pericardial effusion was identified posterior to the heart. Features were not consistent with tamponade physiology.   Radiology/Studies: Dg Chest 2 View: 07/30/2015  CLINICAL DATA:  Shortness of breath EXAM: CHEST  2 VIEW COMPARISON:  07/28/2015 chest radiograph FINDINGS: Do lead left subclavian pacemaker is stable in configuration with lead tips overlying the right atrium and right ventricle. Stable cardiomediastinal silhouette with mild-to-moderate cardiomegaly. Stable trace bilateral pleural effusions. No pneumothorax.  Mild pulmonary edema, which is worsened. No focal lung consolidation. IMPRESSION: 1. Mild congestive heart failure, worsened. 2. Stable trace bilateral pleural effusions. Electronically Signed   By: Delbert Phenix M.D.   On: 07/30/2015 11:53    ASSESSMENT AND PLAN:   1. Acute on Chronic Systolic CHF - Echo on 07/17/2015 showed EF of 35-40%, moderate MR, severely dilated LA, moderate TR, and PA peak pressure of 48 mmHg.  - PO Lasix had been held at discharge pending improvement in her creatinine with the plan to restart at her follow-up OV.  - BNP elevated to 3279 (previously 1616 on 07/28/2015).  - CXR showed mild congestive heart failure, worsened since previous imaging and stable bilateral pleural effusions. - has received one dose of lasix 40mg  IV while in the ED. Will administer another dose this evening. Reassess in the AM to determine need for IV vs. PO Lasix. - Daily BMET for creatinine and potassium.  - Obtain daily weight and I&O's.  2. Paroxysmal Atrial Fibrillation - had TEE/DCCV on 07/25/2015 with return to NSR. [employer] report shows she went back into atrial fibrillation at 0300 on 07/30/2015. - rate currently in high 80's -90's.  - continue Metoprolol for rate control.  - This patients CHA2DS2-VASc Score and unadjusted Ischemic Stroke Rate (% per year) is equal to 9.7 % stroke rate/year from a score of 6. Continue Eliquis for anticoagulation.   3. Normocytic Anemia - hemoglobin improved from 8.3 on discharge to 9.4 today. - had GI consult last admission and they recommended outpatient follow-up. - will continue to monitor.  4. Stage  3 CKD - creatinine 1.44 on 07/30/2015. Close to baseline of 1.1 - 1.4. - monitor with daily BMET.  5. Type 2 DM - Metformin had been on hold due to acute on chronic kidney injury during recent hospitalization. - SSI while admitted.  Signed, Ellsworth Lennox, PA-C 07/30/2015, 1:04 PM Pager: [phone] Agree with assessment and plan as  noted above.  The patient went home 2 days ago.  At that time her lungs were clear and creatinine was elevated and it was felt that she would not need any Lasix until her follow-up visit which was scheduled for 08/01/15.  She was in normal sinus rhythm at discharge.  She reports that the night that she went home she had difficulty sleeping and experienced orthopnea.  She gained a pound by the next morning and a second pound by today.   she has had worsening orthopnea.  She has had slight tightness in her chest related to her dyspnea.  Her chest x-ray today shows worsened pulmonary edema from prior films.  Her B natruretic peptide has increased from 1616 up to 3279.  She is back in atrial fibrillation and this occurred at 3:00 this morning.  At this point she is well rate controlled and we will not make any further attempts at this time to reestablish normal sinus rhythm.  Her last cardioversion was only 5 days ago.  We will treat with IV Lasix today and consider switch to oral Lasix tomorrow.  We will anticipate that her serum creatinine will rise but we will need to tolerate a higher serum creatinine in order to keep her lungs dry enough for comfort.  Exam today reveals bibasilar rales which were not present at discharge Monday.  Her jugular venous pressure remains elevated.  There is no peripheral edema but the patient has had her legs elevated at home.

## 2015-07-30 NOTE — ED Notes (Signed)
Cardiology at bedside.

## 2015-07-30 NOTE — ED Notes (Signed)
Patient transported to X-ray 

## 2015-07-30 NOTE — ED Notes (Signed)
Pt here for chest pain, sob and weakness. Pt discharged from the hospital Monday. Pt has gained 2 pounds.

## 2015-07-30 NOTE — ED Notes (Signed)
Charge RN notified to interrogate pacemaker-[employer].

## 2015-07-31 ENCOUNTER — Encounter (HOSPITAL_COMMUNITY): Payer: Self-pay | Admitting: Physician Assistant

## 2015-07-31 DIAGNOSIS — D649 Anemia, unspecified: Secondary | ICD-10-CM | POA: Diagnosis present

## 2015-07-31 DIAGNOSIS — Z7901 Long term (current) use of anticoagulants: Secondary | ICD-10-CM

## 2015-07-31 HISTORY — DX: Long term (current) use of anticoagulants: Z79.01

## 2015-07-31 LAB — BASIC METABOLIC PANEL
ANION GAP: 8 (ref 5–15)
BUN: 29 mg/dL — ABNORMAL HIGH (ref 6–20)
CALCIUM: 9 mg/dL (ref 8.9–10.3)
CHLORIDE: 104 mmol/L (ref 101–111)
CO2: 26 mmol/L (ref 22–32)
CREATININE: 1.43 mg/dL — AB (ref 0.44–1.00)
GFR calc non Af Amer: 33 mL/min — ABNORMAL LOW (ref >60)
GFR, EST AFRICAN AMERICAN: 39 mL/min — AB (ref >60)
GLUCOSE: 146 mg/dL — AB (ref 65–99)
Potassium: 4.2 mmol/L (ref 3.5–5.1)
Sodium: 138 mmol/L (ref 135–145)

## 2015-07-31 LAB — GLUCOSE, CAPILLARY
GLUCOSE-CAPILLARY: 96 mg/dL (ref 65–99)
GLUCOSE-CAPILLARY: 154 mg/dL — AB (ref 65–99)
Glucose-Capillary: 209 mg/dL — ABNORMAL HIGH (ref 65–99)

## 2015-07-31 MED ORDER — FUROSEMIDE 10 MG/ML IJ SOLN
40.0000 mg | Freq: Two times a day (BID) | INTRAMUSCULAR | Status: DC
  Administered 2015-07-31 – 2015-08-02 (×6): 40 mg via INTRAVENOUS
  Filled 2015-07-31 (×6): qty 4

## 2015-07-31 NOTE — Progress Notes (Signed)
Patient Name: Danielle Benitez Date of Encounter: 07/31/2015  Principal Problem:   Acute on chronic systolic (congestive) heart failure [facility]) Active Problems:   Atrial fibrillation - 07/11/15 by PPM interrogation, recurrent 10/19 after DCCV 10/14   DM (diabetes mellitus), type 2, uncontrolled, with renal complications (HCC)   CKD (chronic kidney disease), stage III   Anticoagulated- Eliquis   Normochromic normocytic anemia   Primary Cardiologist: Dr Jens Som  Patient Profile: 81 y.o. female with past medical history of chronic systolic CHF, HTN, HLD, orthostatic hypotension, DM, mild aortic stenosis, CKD stage III, symptomatic bradycardia (s/p MDT PPM 2013), RBBB, and LAFB. Recent admit w/ CHF and afib w/ TEE/DCCV 10/14, readmit 10/19 for CHF. Dry weight probably 170 or so.   SUBJECTIVE: Still SOB with minimal activity, such as moving self in the bed  OBJECTIVE Filed Vitals:   07/30/15 2004 07/31/15 0033 07/31/15 0607 07/31/15 1142  BP: 132/82 103/62 107/67 108/92  Pulse: 98 93 87 116  Temp: 97.7 F (36.5 C) 98.4 F (36.9 C) 98.4 F (36.9 C) 97.4 F (36.3 C)  TempSrc: Oral Oral Oral Oral  Resp: 20 20 18    Height:      Weight:   177 lb 8 oz (80.513 kg)   SpO2: 100% 100% 100% 100%    Intake/Output Summary (Last 24 hours) at 07/31/15 1252 Last data filed at 07/31/15 0901  Gross per 24 hour  Intake    720 ml  Output   2200 ml  Net  -1480 ml   Filed Weights   07/30/15 1235 07/30/15 1745 07/31/15 0607  Weight: 181 lb 14.4 oz (82.509 kg) 180 lb 8 oz (81.874 kg) 177 lb 8 oz (80.513 kg)    PHYSICAL EXAM General: Well developed, well nourished, female in no acute distress. Head: Normocephalic, atraumatic.  Neck: Supple without bruits, JVD 12 cm. Lungs:  Resp regular and unlabored, rales bases. Heart: Irreg R&R, S1, S2, no S3, S4, + murmur; no rub. Abdomen: Soft, non-tender, non-distended, BS + x 4.  Extremities: No clubbing, cyanosis, 1+ edema.  Neuro: Alert and  oriented X 3. Moves all extremities spontaneously. Psych: Normal affect.  LABS: CBC:  Recent Labs  07/30/15 1109  WBC 6.2  HGB 9.4*  HCT 31.3*  MCV 82.2  PLT 218   Basic Metabolic Panel:  Recent Labs  48/27/07 1109 07/31/15 0314  NA 133* 138  K 4.6 4.2  CL 100* 104  CO2 24 26  GLUCOSE 187* 146*  BUN 29* 29*  CREATININE 1.44* 1.43*  CALCIUM 9.2 9.0    Recent Labs  07/30/15 1116  TROPIPOC 0.02   BNP:  B NATRIURETIC PEPTIDE  Date/Time Value Ref Range Status  07/30/2015 11:09 AM 3279.3* 0.0 - 100.0 pg/mL Final  07/28/2015 11:36 AM 1616.8* 0.0 - 100.0 pg/mL Final   TELE: Atrial fib +/- V pacing       Radiology/Studies: Dg Chest 2 View  07/30/2015  CLINICAL DATA:  Shortness of breath EXAM: CHEST  2 VIEW COMPARISON:  07/28/2015 chest radiograph FINDINGS: Do lead left subclavian pacemaker is stable in configuration with lead tips overlying the right atrium and right ventricle. Stable cardiomediastinal silhouette with mild-to-moderate cardiomegaly. Stable trace bilateral pleural effusions. No pneumothorax. Mild pulmonary edema, which is worsened. No focal lung consolidation. IMPRESSION: 1. Mild congestive heart failure, worsened. 2. Stable trace bilateral pleural effusions. Electronically Signed   By: Delbert Phenix M.D.   On: 07/30/2015 11:53   Current Medications:  . allopurinol  300 mg Oral Daily  . apixaban  2.5 mg Oral BID  . atorvastatin  20 mg Oral q1800  . DULoxetine  20 mg Oral Daily  . furosemide  40 mg Intravenous BID  . insulin aspart  0-15 Units Subcutaneous TID WC  . iron polysaccharides  150 mg Oral BID  . lisinopril  10 mg Oral Daily  . metoprolol tartrate  25 mg Oral BID  . montelukast  10 mg Oral QHS  . pantoprazole  40 mg Oral Daily  . sodium chloride  3 mL Intravenous Q12H      ASSESSMENT AND PLAN: 1. Acute on Chronic Systolic CHF - Echo on 07/17/2015 showed EF of 35-40%, moderate MR, severely dilated LA, moderate TR, and PA peak pressure of  48 mmHg.  - PO Lasix had been held at discharge pending improvement in her creatinine with the plan to restart at her follow-up OV.  - BNP elevated to 3279 (previously 1616 on 07/28/2015).  - CXR showed mild congestive heart failure, worsened since previous imaging and stable bilateral pleural effusions. -  IV Lasix 40 mg bid for now. - Daily BMET for creatinine and potassium.  - Obtain daily weight and I&O's. - believe dry weight about 170  2. Paroxysmal Atrial Fibrillation - had TEE/DCCV on 07/25/2015 with return to NSR. [employer] report shows she went back into atrial fibrillation at 0300 on 07/30/2015. - rate currently in high 80's -90's.  - continue Metoprolol for rate control.  - This patients CHA2DS2-VASc Score and unadjusted Ischemic Stroke Rate (% per year) is equal to 9.7 % stroke rate/year from a score of 6. Continue Eliquis for anticoagulation.   3. Normocytic Anemia - hemoglobin improved from 8.3 on discharge to 9.4 on admission - had GI consult last admission and they recommended outpatient follow-up. - will continue to monitor.  4. Stage 3 CKD - creatinine 1.43 on 07/31/2015. Close to baseline of 1.1 - 1.4. - monitor with daily BMET.  5. Type 2 DM - Metformin had been on hold due to acute on chronic kidney injury during recent hospitalization. - SSI while admitted.   6. Deconditioning:  - pt c/o weakness - was to have HH PT at d/c - will ask them to start following her in am, may need inpt rehab at [employer], Theodore Demark , PA-C 12:52 PM 07/31/2015 Agree with above assessment and plan.  Lungs today still sound congested.  Continue IV Lasix.  Creatinine is stable.  Weight is down 3 pounds.

## 2015-07-31 NOTE — Progress Notes (Signed)
   07/31/15 0800  Clinical Encounter Type  Visited With Patient  Visit Type Initial  Referral From Nurse;Patient  Consult/Referral To Chaplain  Spiritual Encounters  Spiritual Needs Literature  Advance Directives (For Healthcare)  Does patient have an advance directive? No  Would patient like information on creating an advanced directive? Yes - Educational materials given  [facility] gave AD literature; please contact [facility] to complete once pt reviews and fills out. Erline Levine 8:59 AM

## 2015-07-31 NOTE — Consult Note (Signed)
   [facility] CM Inpatient Consult   07/31/2015  Danielle Benitez [birth year] [id number] Patient is recent new patient with [facility] Care Management for chronic disease management services.  Patient has been engaged by a [employer].  Our community based plan of care has focused on disease management and community resource support.  Patient will receive a post discharge transition of care call and will be evaluated for monthly home visits for assessments and disease process education.  Came by to see the patient at bedside and she was sound asleep and breathing comfortably with 02 noted.  Will make Inpatient Case Manager aware that [facility] Care Management following. Of note, [facility] Care Management services does not replace or interfere with any services that are arranged by inpatient case management or social work.  For additional questions or referrals please contact: Charlesetta Shanks, RN BSN CCM [facility]  [phone] business mobile phone

## 2015-07-31 NOTE — Research (Signed)
[email or web address]'@[email or web address]'$  Informed Consent   Subject Name: Danielle Benitez  Subject met inclusion and exclusion criteria.  The informed consent form, study requirements and expectations were reviewed with the subject and questions and concerns were addressed prior to the signing of the consent form.  The subject verbalized understanding of the trail requirements.  The subject agreed to participate in the [email or web address]$  trial and signed the informed consent.  The informed consent was obtained prior to performance of any protocol-specific procedures for the subject.  A copy of the signed informed consent was given to the subject and a copy was placed in the subject's medical record.  [name] 07/31/2015, 13:20

## 2015-07-31 NOTE — Evaluation (Signed)
Physical Therapy Evaluation Patient Details Name: BRINNLEY CONSTANTINE MRN: [id number] DOB: 1932-08-31 Today's Date: 07/31/2015   History of Present Illness  Pt is an 81 y/o female with PMH of chronic systolic CHF, HTN, HLD, orthostatic hypotension, DM, mild aortic stenosis, CKD stage III, symptomatic bradycardia (s/p MDT PPM 2013), RBBB, and LAFB. Recent admit w/ CHF and afib w/ TEE/DCCV 10/14, readmit 10/19 for CHF.  Pt presented with increased SOB and 2 lb weight gain since last discharge.  Clinical Impression  Pt presents with impairments as indicated below.  Pt would benefit from skilled acute PT services to maximize safety and independence with functional mobility as well as to increase activity tolerance.  No follow-up PT recommended at this point in time.  Will continue to see    Follow Up Recommendations Supervision/Assistance - 24 hour    Equipment Recommendations  None recommended by PT    Recommendations for Other Services OT consult     Precautions / Restrictions Precautions Precautions: Fall Restrictions Weight Bearing Restrictions: No      Mobility  Bed Mobility               General bed mobility comments: received sitting in chair  Transfers   Equipment used: Rolling walker (2 wheeled) Transfers: Sit to/from Stand Sit to Stand: Supervision         General transfer comment: Supervision for safety.  Pt slow to rise.  VCs to scoot to edge of surface push up from chair.  Ambulation/Gait Ambulation/Gait assistance: Min guard Ambulation Distance (Feet): 50 Feet Assistive device: Rolling walker (2 wheeled) Gait Pattern/deviations: Step-through pattern;Decreased stride length;Antalgic;Trunk flexed Gait velocity: slow Gait velocity interpretation: Below normal speed for age/gender General Gait Details: Some mild instability noted with ambulation.  Increased trunk flexion which improves some with VCs.  Pt with antalgic gait which she attributes to knee OA and  history of gout. Noted mild increase in SOB.  On 2L nasal cannula, O2 sats 96% at rest and HR 80bpm.  With ambulation on 2L O2, O2 sats 98% and HR increased to 110bpm.  Stairs            Wheelchair Mobility    Modified Rankin (Stroke Patients Only)       Balance Overall balance assessment: Needs assistance Sitting-balance support: Feet supported;No upper extremity supported Sitting balance-Leahy Scale: Good     Standing balance support: During functional activity;Bilateral upper extremity supported Standing balance-Leahy Scale: Fair                               Pertinent Vitals/Pain Pain Assessment: No/denies pain    Home Living Family/patient expects to be discharged to:: Private residence Living Arrangements: Children;Other relatives Available Help at Discharge: Family;Available PRN/intermittently Type of Home: Apartment Home Access: Level entry     Home Layout: One level Home Equipment: Walker - 2 wheels;Cane - quad;Cane - single point;Shower seat      Prior Function Level of Independence: Independent with assistive device(s)         Comments: Pt notes she was mostly using SPC for ambulation until recently when she started using RW.     Hand Dominance        Extremity/Trunk Assessment   Upper Extremity Assessment: Overall WFL for tasks assessed           Lower Extremity Assessment: Overall WFL for tasks assessed         Communication  Communication: No difficulties  Cognition Arousal/Alertness: Awake/alert Behavior During Therapy: WFL for tasks assessed/performed Overall Cognitive Status: Within Functional Limits for tasks assessed                      General Comments General comments (skin integrity, edema, etc.): Pt educated on energy conservation technique/pursed lip breathing.    Exercises        Assessment/Plan    PT Assessment Patient needs continued PT services  PT Diagnosis Difficulty  walking;Abnormality of gait   PT Problem List Decreased activity tolerance;Decreased balance;Decreased mobility;Decreased knowledge of use of DME;Decreased safety awareness;Cardiopulmonary status limiting activity  PT Treatment Interventions DME instruction;Gait training;Functional mobility training;Therapeutic activities;Therapeutic exercise;Balance training;Patient/family education   PT Goals (Current goals can be found in the Care Plan section) Acute Rehab PT Goals Patient Stated Goal: none stated PT Goal Formulation: With patient Time For Goal Achievement: 08/14/15 Potential to Achieve Goals: Good    Frequency Min 3X/week   Barriers to discharge        Co-evaluation               End of Session Equipment Utilized During Treatment: Gait belt;Oxygen (2L [place]) Activity Tolerance: Patient tolerated treatment well Patient left: in chair;with call bell/phone within reach;with chair alarm set Nurse Communication: Mobility status         Time: 1610-9604 PT Time Calculation (min) (ACUTE ONLY): 14 min   Charges:   PT Evaluation $Initial PT Evaluation Tier I: 1 Procedure     PT G Codes:        Anslee Micheletti 2015-08-28, 2:17 PM Arnoldo Morale, SPT

## 2015-08-01 ENCOUNTER — Ambulatory Visit: Payer: Medicare Other | Admitting: Physician Assistant

## 2015-08-01 LAB — BASIC METABOLIC PANEL
ANION GAP: 9 (ref 5–15)
BUN: 29 mg/dL — ABNORMAL HIGH (ref 6–20)
CALCIUM: 9.1 mg/dL (ref 8.9–10.3)
CO2: 28 mmol/L (ref 22–32)
CREATININE: 1.45 mg/dL — AB (ref 0.44–1.00)
Chloride: 102 mmol/L (ref 101–111)
GFR, EST AFRICAN AMERICAN: 38 mL/min — AB (ref >60)
GFR, EST NON AFRICAN AMERICAN: 33 mL/min — AB (ref >60)
Glucose, Bld: 133 mg/dL — ABNORMAL HIGH (ref 65–99)
Potassium: 4.2 mmol/L (ref 3.5–5.1)
SODIUM: 139 mmol/L (ref 135–145)

## 2015-08-01 LAB — GLUCOSE, CAPILLARY
GLUCOSE-CAPILLARY: 139 mg/dL — AB (ref 65–99)
GLUCOSE-CAPILLARY: 200 mg/dL — AB (ref 65–99)
GLUCOSE-CAPILLARY: 173 mg/dL — AB (ref 65–99)
GLUCOSE-CAPILLARY: 223 mg/dL — AB (ref 65–99)

## 2015-08-01 MED ORDER — CETYLPYRIDINIUM CHLORIDE 0.05 % MT LIQD: 7.0000 mL | Freq: Two times a day (BID) | OROMUCOSAL | Status: DC

## 2015-08-01 NOTE — Progress Notes (Signed)
[facility]  Patient Status: Active (receiving services up to time of hospitalization)  [facility] is providing the following services: RN  If patient discharges after hours, please call [phone].   [name] 08/01/2015, 2:14 PM

## 2015-08-01 NOTE — Progress Notes (Signed)
Patient Name: Danielle Benitez Date of Encounter: 08/01/2015  Principal Problem:   Acute on chronic systolic (congestive) heart failure (HCC) Active Problems:   DM (diabetes mellitus), type 2, uncontrolled, with renal complications (HCC)   CKD (chronic kidney disease), stage III   Atrial fibrillation - 07/11/15 by PPM interrogation, recurrent 10/19 after DCCV 10/14   Anticoagulated- Eliquis   Normochromic normocytic anemia   Primary Cardiologist: Dr. Jens Som Patient Profile: 81 y.o. female w/ PMH of chronic systolic CHF (EF 16-10%), HTN, HLD, orthostatic hypotension, DM, mild aortic stenosis, CKD stage III, symptomatic bradycardia (s/p PPM 2013), PAF (on Eliquis), RBBB, and LAFB admitted on 07/30/15 for worsening dyspnea and weight gain.  SUBJECTIVE: Reports improvement in her lower extremity edema and breathing status. Denies any chest pain or palpitations.  OBJECTIVE Filed Vitals:   07/31/15 1142 07/31/15 1949 08/01/15 0056 08/01/15 0704  BP: 108/92 100/79 99/67 117/66  Pulse: 116 99 79 68  Temp: 97.4 F (36.3 C) 98.4 F (36.9 C) 97.9 F (36.6 C) 98.3 F (36.8 C)  TempSrc: Oral Oral Oral Oral  Resp:  Height:      Weight:    174 lb 14.4 oz (79.334 kg)  SpO2: 100% 100% 99% 99%    Intake/Output Summary (Last 24 hours) at 08/01/15 0726 Last data filed at 08/01/15 0655  Gross per 24 hour  Intake   1683 ml  Output   2225 ml  Net   -542 ml   Filed Weights   07/30/15 1745 07/31/15 0607 08/01/15 0704  Weight: 180 lb 8 oz (81.874 kg) 177 lb 8 oz (80.513 kg) 174 lb 14.4 oz (79.334 kg)    PHYSICAL EXAM General: Well developed, well nourished, female in no acute distress. Head: Normocephalic, atraumatic.  Neck: Supple without bruits, JVD remains elevated. Lungs:  Resp regular and unlabored, Rales at bases, no audible wheezing. Heart: Irregularly irregular, S1, S2, no S3, S4, or murmur; no rub. Abdomen: Soft, non-tender, non-distended with normoactive bowel  sounds. No hepatomegaly. No rebound/guarding. No obvious abdominal masses. Extremities: No clubbing or cyanosis. 1+ edema bilaterally. Distal pedal pulses are 2+ bilaterally. Neuro: Alert and oriented X 3. Moves all extremities spontaneously. Psych: Normal affect.  LABS: CBC: Recent Labs  07/30/15 1109  WBC 6.2  HGB 9.4*  HCT 31.3*  MCV 82.2  PLT 218   Basic Metabolic Panel: Recent Labs  07/31/15 0314 08/01/15 0218  NA 138 139  K 4.2 4.2  CL 104 102  CO2 26 28  GLUCOSE 146* 133*  BUN 29* 29*  CREATININE 1.43* 1.45*  CALCIUM 9.0 9.1   Recent Labs  07/30/15 1116  TROPIPOC 0.02   BNP:  B NATRIURETIC PEPTIDE  Date/Time Value Ref Range Status  07/30/2015 11:09 AM 3279.3* 0.0 - 100.0 pg/mL Final  07/28/2015 11:36 AM 1616.8* 0.0 - 100.0 pg/mL Final   TELE: Atrial fibrillation with V-pacing. Rates in 90's - low 100's.     Current Medications:  . allopurinol  300 mg Oral Daily  . apixaban  2.5 mg Oral BID  . atorvastatin  20 mg Oral q1800  . DULoxetine  20 mg Oral Daily  . furosemide  40 mg Intravenous BID  . insulin aspart  0-15 Units Subcutaneous TID WC  . iron polysaccharides  150 mg Oral BID  . lisinopril  10 mg Oral Daily  . metoprolol tartrate  25 mg Oral BID  . montelukast  10 mg Oral QHS  . pantoprazole  40 mg Oral Daily  . sodium chloride  3 mL Intravenous Q12H      ASSESSMENT AND PLAN:  1. Acute on Chronic Systolic CHF - Echo on 07/17/2015 showed EF of 35-40%, moderate MR, severely dilated LA, moderate TR, and PA peak pressure of 48 mmHg.  - BNP elevated to 3279 on admission (previously 1616 on 07/28/2015).  - Weight on admission was 181lbs, down to 174lbs on 08/01/2015 (-3 lbs overnight). Dry weight ~ 170 lbs. Net I&O's -2.5L since admission. - On IV Lasix 40mg  BID. Continue at current regimen. Creatinine is stable. - Daily BMET for creatinine and potassium.  - Obtain daily weight and I&O's.  2. Paroxysmal Atrial Fibrillation - had TEE/DCCV  on 07/25/2015 with return to NSR. [employer] report shows she went back into atrial fibrillation at 0300 on 07/30/2015. - rate currently in 90's - low 100's.  - continue Metoprolol for rate control. Once diuresis is complete, could consider increasing Metoprolol if rate remains elevated and BP's are stable. - This patients CHA2DS2-VASc Score and unadjusted Ischemic Stroke Rate (% per year) is equal to 9.7 % stroke rate/year from a score of 6. Continue Eliquis for anticoagulation.   3. Normocytic Anemia - hemoglobin improved from 8.3 on discharge to 9.4 on admission. - had GI consult last admission and they recommended outpatient follow-up. - will continue to monitor.  4. Stage 3 CKD - creatinine stable at 1.45 on 08/01/2015. Close to baseline of 1.1 - 1.4. - monitor with daily BMET.  5. Type 2 DM - Metformin had been on hold due to acute on chronic kidney injury during recent hospitalization. - SSI while admitted.  6. Deconditioning:  - pt c/o weakness. Was to have HH PT at d/c - PT said patient would benefit from skilled acute PT services. Will continue to see while admitted.  Lorri Frederick , PA-C 7:26 AM 08/01/2015 Pager: [phone]  Agree with above assessment and plan.  Her dyspnea is improved.  Oxygen levels are normal on nasal O2.  We will see if oxygen saturations remained okay on room air.  Examination still reveals some decreased breath sounds and bibasilar rales.  We will continue IV Lasix another day and consider switch to oral Lasix tomorrow.  We are sending a urinalysis because of foul smelling urine.  Need to check on results tomorrow and order urine culture if indicated.  I spoke with patient's daughter today and gave her an update.

## 2015-08-01 NOTE — Progress Notes (Signed)
Notified Manning,PA of the patients urine having a foul odor. Doctor will put in orders for urine sample. Report given to day nurse. Nurse signing off at this time.

## 2015-08-01 NOTE — Care Management Important Message (Signed)
Important Message  Patient Details  Name: Danielle Benitez MRN: [id number] Date of Birth: [birth year]   Medicare Important Message Given:  Yes-second notification given    Kyla Balzarine 08/01/2015, 12:04 PM

## 2015-08-01 NOTE — Plan of Care (Signed)
Problem: Phase I Progression Outcomes Goal: EF % per last Echo/documented,Core Reminder form on chart Outcome: Completed/Met Date Met:  08/01/15 Performed on 07/17/2015  EF result - 35-40%

## 2015-08-01 NOTE — Progress Notes (Signed)
Patient is active with [facility] as prior to admission for HHRN / PT; B Yanette Tripoli RN,MHA,BSN [phone] 

## 2015-08-02 DIAGNOSIS — I481 Persistent atrial fibrillation: Secondary | ICD-10-CM

## 2015-08-02 DIAGNOSIS — E1165 Type 2 diabetes mellitus with hyperglycemia: Secondary | ICD-10-CM

## 2015-08-02 DIAGNOSIS — E1122 Type 2 diabetes mellitus with diabetic chronic kidney disease: Secondary | ICD-10-CM

## 2015-08-02 DIAGNOSIS — Z8679 Personal history of other diseases of the circulatory system: Secondary | ICD-10-CM

## 2015-08-02 DIAGNOSIS — N390 Urinary tract infection, site not specified: Secondary | ICD-10-CM | POA: Diagnosis not present

## 2015-08-02 LAB — URINALYSIS, ROUTINE W REFLEX MICROSCOPIC
BILIRUBIN URINE: NEGATIVE
Glucose, UA: NEGATIVE mg/dL
Hgb urine dipstick: NEGATIVE
KETONES UR: NEGATIVE mg/dL
NITRITE: NEGATIVE
PH: 5 (ref 5.0–8.0)
Protein, ur: NEGATIVE mg/dL
SPECIFIC GRAVITY, URINE: 1.009 (ref 1.005–1.030)
UROBILINOGEN UA: 0.2 mg/dL (ref 0.0–1.0)

## 2015-08-02 LAB — BASIC METABOLIC PANEL
ANION GAP: 8 (ref 5–15)
BUN: 29 mg/dL — AB (ref 6–20)
CO2: 30 mmol/L (ref 22–32)
Calcium: 8.8 mg/dL — ABNORMAL LOW (ref 8.9–10.3)
Chloride: 97 mmol/L — ABNORMAL LOW (ref 101–111)
Creatinine, Ser: 1.46 mg/dL — ABNORMAL HIGH (ref 0.44–1.00)
GFR calc Af Amer: 38 mL/min — ABNORMAL LOW (ref >60)
GFR, EST NON AFRICAN AMERICAN: 33 mL/min — AB (ref >60)
Glucose, Bld: 210 mg/dL — ABNORMAL HIGH (ref 65–99)
POTASSIUM: 4 mmol/L (ref 3.5–5.1)
SODIUM: 135 mmol/L (ref 135–145)

## 2015-08-02 LAB — GLUCOSE, CAPILLARY
GLUCOSE-CAPILLARY: 184 mg/dL — AB (ref 65–99)
Glucose-Capillary: 197 mg/dL — ABNORMAL HIGH (ref 65–99)
Glucose-Capillary: 168 mg/dL — ABNORMAL HIGH (ref 65–99)
Glucose-Capillary: 180 mg/dL — ABNORMAL HIGH (ref 65–99)

## 2015-08-02 LAB — URINE MICROSCOPIC-ADD ON

## 2015-08-02 MED ORDER — CIPROFLOXACIN HCL 500 MG PO TABS
500.0000 mg | ORAL_TABLET | Freq: Two times a day (BID) | ORAL | Status: DC
  Administered 2015-08-02 – 2015-08-03 (×3): 500 mg via ORAL
  Filled 2015-08-02 (×4): qty 1

## 2015-08-02 MED ORDER — METOPROLOL TARTRATE 25 MG PO TABS
25.0000 mg | ORAL_TABLET | Freq: Three times a day (TID) | ORAL | Status: DC
  Administered 2015-08-02 – 2015-08-03 (×2): 25 mg via ORAL
  Filled 2015-08-02: qty 1

## 2015-08-02 MED ORDER — METOPROLOL TARTRATE 25 MG PO TABS
25.0000 mg | ORAL_TABLET | Freq: Three times a day (TID) | ORAL | Status: DC
  Administered 2015-08-02: 25 mg via ORAL
  Filled 2015-08-02 (×2): qty 1

## 2015-08-02 NOTE — Progress Notes (Signed)
Subjective:  SOB improved, still on O2  Objective:  Vital Signs in the last 24 hours: Temp:  [97.7 F (36.5 C)-98 F (36.7 C)] 98 F (36.7 C) (10/22 0624) Pulse Rate:  [83-89] 84 (10/22 0624) Resp:  [17-18] 18 (10/22 0624) BP: (92-108)/(53-71) 108/71 mmHg (10/22 0624) SpO2:  [100 %] 100 % (10/22 0624) Weight:  [173 lb 1.6 oz (78.518 kg)] 173 lb 1.6 oz (78.518 kg) (10/22 0624)  Intake/Output from previous day:  Intake/Output Summary (Last 24 hours) at 08/02/15 0929 Last data filed at 08/02/15 0315  Gross per 24 hour  Intake    720 ml  Output   2150 ml  Net  -1430 ml    Physical Exam: General appearance: alert, cooperative and no distress Neck: no JVD Lungs: decreased Rt base Heart: irregularly irregular rhythm Extremities: 1+ pitting edema Skin: pale, cool, dry Neurologic: Grossly normal   Rate: 82  Rhythm: atrial fibrillation  Lab Results:  Recent Labs  07/30/15 1109  WBC 6.2  HGB 9.4*  PLT 218    Recent Labs  08/01/15 0218 08/02/15 0304  NA 139 135  K 4.2 4.0  CL 102 97*  CO2 28 30  GLUCOSE 133* 210*  BUN 29* 29*  CREATININE 1.45* 1.46*   No results for input(s): TROPONINI in the last 72 hours.  Invalid input(s): CK, MB No results for input(s): INR in the last 72 hours.  Scheduled Meds: . allopurinol  300 mg Oral Daily  . antiseptic oral rinse  7 mL Mouth Rinse BID  . apixaban  2.5 mg Oral BID  . atorvastatin  20 mg Oral q1800  . DULoxetine  20 mg Oral Daily  . furosemide  40 mg Intravenous BID  . insulin aspart  0-15 Units Subcutaneous TID WC  . iron polysaccharides  150 mg Oral BID  . lisinopril  10 mg Oral Daily  . metoprolol tartrate  25 mg Oral BID  . montelukast  10 mg Oral QHS  . pantoprazole  40 mg Oral Daily  . sodium chloride  3 mL Intravenous Q12H   Continuous Infusions:  PRN Meds:.sodium chloride, acetaminophen, albuterol, HYDROcodone-acetaminophen, ondansetron (ZOFRAN) IV, senna-docusate, sodium  chloride   Imaging: Imaging results have been reviewed   Assessment/Plan:  81 y.o. female w/ PMH of chronic systolic CHF (EF 12-24%), mild CAD, HTN, HLD, DM, mild aortic stenosis, CKD stage III, symptomatic bradycardia (s/p PPM 2013), PAF s/p DCCV 07/25/15 (on Eliquis), admitted on 07/30/15 for worsening dyspnea and weight gain. She was just admitted with AF 07/11/15-07/28/15. That adm complicated by acute on CRI. Readmitted with recurrent AF and CHF.   Principal Problem:   Acute on chronic systolic (congestive) heart failure (HCC) Active Problems:   Atrial fibrillation - 07/11/15 by PPM interrogation, recurrent 10/19 after DCCV 10/14   SOB (shortness of breath)   CKD (chronic kidney disease), stage III   Chronic systolic CHF (congestive heart failure) (HCC)   Anticoagulated- Eliquis   Cardiomyopathy (HCC)   UTI (urinary tract infection)   DM (diabetes mellitus), type 2, uncontrolled, with renal complications (HCC)   Dyslipidemia   Mild CAD - 20-30% RCA in 2013   Essential hypertension   History of pacemaker - [employer]   Normochromic normocytic anemia   PLAN: Rate control for AF. She probably needs another day of IV lasix. She did complain of dysuria and has leukocytes and WBC in urine- check culture and treat UTI.   [employer] PA-C 08/02/2015, 9:29 AM [phone]  Patient seen and examined and history reviewed. Agree with above findings and plan. Breathing is much better. Oxygen sats 100% on 2 l. Good diuresis with weight coming down. Negative 1 liter yesterday. Still appears volume overloaded. Will continue IV lasix one more day then try and switch to po. DC oxygen today. Will start antibiotics for possible UTI with dysuria. Culture sent. Afib rate is controlled on current metoprolol dose. On Eliquis.   Peter [place], MDFACC 08/02/2015 9:36 AM

## 2015-08-03 DIAGNOSIS — I482 Chronic atrial fibrillation: Secondary | ICD-10-CM

## 2015-08-03 DIAGNOSIS — E1121 Type 2 diabetes mellitus with diabetic nephropathy: Secondary | ICD-10-CM

## 2015-08-03 LAB — BASIC METABOLIC PANEL
Anion gap: 10 (ref 5–15)
BUN: 28 mg/dL — ABNORMAL HIGH (ref 6–20)
CO2: 28 mmol/L (ref 22–32)
Calcium: 9.1 mg/dL (ref 8.9–10.3)
Chloride: 100 mmol/L — ABNORMAL LOW (ref 101–111)
Creatinine, Ser: 1.37 mg/dL — ABNORMAL HIGH (ref 0.44–1.00)
GFR calc Af Amer: 41 mL/min — ABNORMAL LOW (ref >60)
GFR calc non Af Amer: 35 mL/min — ABNORMAL LOW (ref >60)
Glucose, Bld: 164 mg/dL — ABNORMAL HIGH (ref 65–99)
Potassium: 4.3 mmol/L (ref 3.5–5.1)
Sodium: 138 mmol/L (ref 135–145)

## 2015-08-03 LAB — URINE CULTURE

## 2015-08-03 LAB — GLUCOSE, CAPILLARY
GLUCOSE-CAPILLARY: 159 mg/dL — AB (ref 65–99)
Glucose-Capillary: 229 mg/dL — ABNORMAL HIGH (ref 65–99)

## 2015-08-03 MED ORDER — LISINOPRIL 10 MG PO TABS: 10.0000 mg | ORAL_TABLET | Freq: Every day | ORAL | Status: DC

## 2015-08-03 MED ORDER — LISINOPRIL 20 MG PO TABS: 10.0000 mg | ORAL_TABLET | Freq: Every day | ORAL | Status: DC

## 2015-08-03 MED ORDER — CIPROFLOXACIN HCL 500 MG PO TABS: 500.0000 mg | ORAL_TABLET | Freq: Two times a day (BID) | ORAL | Status: DC

## 2015-08-03 MED ORDER — DULOXETINE HCL 20 MG PO CPEP: 20.0000 mg | ORAL_CAPSULE | Freq: Every day | ORAL | Status: DC

## 2015-08-03 MED ORDER — FUROSEMIDE 40 MG PO TABS: 40.0000 mg | ORAL_TABLET | Freq: Every day | ORAL | Status: DC

## 2015-08-03 MED ORDER — ACETAMINOPHEN 325 MG PO TABS: 650.0000 mg | ORAL_TABLET | ORAL | Status: DC | PRN

## 2015-08-03 MED ORDER — FUROSEMIDE 40 MG PO TABS
40.0000 mg | ORAL_TABLET | Freq: Every day | ORAL | Status: DC
  Administered 2015-08-03: 40 mg via ORAL
  Filled 2015-08-03: qty 1

## 2015-08-03 NOTE — Progress Notes (Signed)
Patient discharged home with family. Patient was escorted out of the hospital by a nurse tech and in the company of family. Patient was discharged home with discharge instructions and given the Heart Failure Handbook upon discharge. Both booklets were reviewed with patient before discharge. IV was taken out and the cardiac monitor was discontinued.

## 2015-08-03 NOTE — Progress Notes (Signed)
ReDS Vest Discharge Study  Results of ReDS reading  Your patient is in the Unblinded arm of the Vest at Discharge study.  The ReDS reading is:   ( < 39) = 29  Your patient is ok for discharge.    Thank You   The research team  ;

## 2015-08-03 NOTE — Discharge Summary (Signed)
Patient ID: Danielle Benitez,  MRN: [id number], DOB/AGE: 79-Dec-1933 79 y.o.  Admit date: 07/30/2015 Discharge date: 08/03/2015  Primary Care Provider: Kimber Relic, MD Primary Cardiologist: Dr Jens Som  Discharge Diagnoses Principal Problem:   Acute on chronic systolic heart failure  Active Problems:   Atrial fibrillation - 07/11/15 by PPM interrogation, recurrent 10/19 after DCCV 10/14   CKD (chronic kidney disease), stage III   Chronic systolic CHF    Anticoagulated- Eliquis   Cardiomyopathy - EF 35-40%   UTI (urinary tract infection)   DM, type 2, with renal complications    Dyslipidemia   Mild CAD - 20-30% RCA in 2013   Essential hypertension   History of pacemaker - Medtronic   Normochromic normocytic anemia    Hospital Course:  81 y.o. female w/ PMH of chronic systolic CHF (EF 81-10%-RPRX 07/17/15), mild CAD, HTN, HLD, NIDDM, mild aortic stenosis, CKD stage III, and symptomatic bradycardia (s/p PPM 2013). She presented 07/18/15 with PAF. She had a TEECV on 07/25/15 and was placed on Eliquis. That admission was complicated by acute on CRI. She was discharged 07/28/15.      She was re admitted on 07/30/15 for worsening dyspnea and weight gain. She was found to be back in AF and in CHF. She was diuresed 11 lbs, 3.8 L. Plan is for rate control of her AF. At discharge her REDS score was 29. Her SCr 1.37. She'll need 7-10 day TOC f/u. She does have a suspected UTI and was discharged on Cipro. Urine culture is pending and this will need f/u as an OP.   Discharge Vitals:  Blood pressure 85/68, pulse 131, temperature 97.8 F (36.6 C), temperature source Oral, resp. rate 18, height 5\' 3"  (1.6 m), weight 169 lb 1.6 oz (76.703 kg), SpO2 98 %.    Labs: Results for orders placed or performed during the hospital encounter of 07/30/15 (from the past 24 hour(s))  Glucose, capillary     Status: Abnormal   Collection Time: 08/02/15 12:09 PM  Result Value Ref Range   Glucose-Capillary 168  (H) 65 - 99 mg/dL  Glucose, capillary     Status: Abnormal   Collection Time: 08/02/15  4:28 PM  Result Value Ref Range   Glucose-Capillary 180 (H) 65 - 99 mg/dL   Comment 1 Notify RN    Comment 2 Document in Chart   Glucose, capillary     Status: Abnormal   Collection Time: 08/02/15  9:27 PM  Result Value Ref Range   Glucose-Capillary 184 (H) 65 - 99 mg/dL  Basic metabolic panel     Status: Abnormal   Collection Time: 08/03/15  3:00 AM  Result Value Ref Range   Sodium 138 135 - 145 mmol/L   Potassium 4.3 3.5 - 5.1 mmol/L   Chloride 100 (L) 101 - 111 mmol/L   CO2 28 22 - 32 mmol/L   Glucose, Bld 164 (H) 65 - 99 mg/dL   BUN 28 (H) 6 - 20 mg/dL   Creatinine, Ser 4.58 (H) 0.44 - 1.00 mg/dL   Calcium 9.1 8.9 - 59.2 mg/dL   GFR calc non Af Amer 35 (L) >60 mL/min   GFR calc Af Amer 41 (L) >60 mL/min   Anion gap 10 5 - 15  Glucose, capillary     Status: Abnormal   Collection Time: 08/03/15  6:20 AM  Result Value Ref Range   Glucose-Capillary 159 (H) 65 - 99 mg/dL    Disposition:  Follow-up Information    Follow up with [facility].   Why:  They will do your home health care at your home   Contact information:   [address] [place] [address] [phone]       Follow up with Olga Millers, MD.   Specialty:  Cardiology   Why:  office will contact you   Contact information:   [address] [place] [address] [phone]       Discharge Medications:    Medication List    TAKE these medications        acetaminophen 325 MG tablet  Commonly known as:  TYLENOL  Take 2 tablets (650 mg total) by mouth every 4 (four) hours as needed for headache or mild pain.     albuterol 108 (90 BASE) MCG/ACT inhaler  Commonly known as:  PROVENTIL HFA;VENTOLIN HFA  Inhale 2 puffs into the lungs every 6 (six) hours as needed for wheezing or shortness of breath.     allopurinol 300 MG tablet  Commonly known as:  ZYLOPRIM  Take 1  tablet (300 mg total) by mouth daily.     apixaban 2.5 MG Tabs tablet  Commonly known as:  ELIQUIS  Take 1 tablet (2.5 mg total) by mouth 2 (two) times daily.     atorvastatin 40 MG tablet  Commonly known as:  LIPITOR  Take 0.5 tablets (20 mg total) by mouth daily at 6 PM.     ciprofloxacin 500 MG tablet  Commonly known as:  CIPRO  Take 1 tablet (500 mg total) by mouth 2 (two) times daily.     DULoxetine 20 MG capsule  Commonly known as:  CYMBALTA  Take 1 capsule (20 mg total) by mouth at bedtime. Take 1 tablet by mouth daily to help treat anxiety and nerves.     furosemide 40 MG tablet  Commonly known as:  LASIX  Take 1 tablet (40 mg total) by mouth daily.     iron polysaccharides 150 MG capsule  Commonly known as:  NU-IRON  Take 1 capsule (150 mg total) by mouth 2 (two) times daily.     lisinopril 20 MG tablet  Commonly known as:  PRINIVIL,ZESTRIL  Take 0.5 tablets (10 mg total) by mouth at bedtime. Take 1/2 tablet by mouth once daily for blood pressure     metoprolol tartrate 25 MG tablet  Commonly known as:  LOPRESSOR  Take 1 tablet (25 mg total) by mouth 2 (two) times daily.     montelukast 10 MG tablet  Commonly known as:  SINGULAIR  Take 1 tablet (10 mg total) by mouth at bedtime.     pantoprazole 40 MG tablet  Commonly known as:  PROTONIX  Take 1 tablet (40 mg total) by mouth daily.     saxagliptin HCl 2.5 MG Tabs tablet  Commonly known as:  ONGLYZA  Take 1 tablet (2.5 mg total) by mouth daily. Take one tablet by mouth once daily for blood sugar     senna-docusate 8.6-50 MG tablet  Commonly known as:  Senokot-S  Take 1 tablet by mouth daily as needed for mild constipation.     triamcinolone 0.025 % cream  Commonly known as:  KENALOG  Apply 1 application topically 4 (four) times daily as needed (itching).         Duration of Discharge Encounter: Greater than 30 minutes including physician time.  Jolene Provost PA-C 08/03/2015 10:57 AM

## 2015-08-03 NOTE — Progress Notes (Signed)
Subjective:  SOB improved, didn't sleep well last night  Objective:  Vital Signs in the last 24 hours: Temp:  [97.8 F (36.6 C)-98.2 F (36.8 C)] 97.8 F (36.6 C) (10/23 0428) Pulse Rate:  [95-124] 98 (10/23 0428) Resp:  [18] 18 (10/23 0428) BP: (92-122)/(53-79) 112/53 mmHg (10/23 0428) SpO2:  [93 %-97 %] 93 % (10/23 0428) Weight:  [169 lb 1.6 oz (76.703 kg)] 169 lb 1.6 oz (76.703 kg) (10/23 0428)  Intake/Output from previous day:  Intake/Output Summary (Last 24 hours) at 08/03/15 0733 Last data filed at 08/02/15 2152  Gross per 24 hour  Intake    480 ml  Output    801 ml  Net   -321 ml    Physical Exam: General appearance: alert, cooperative and no distress Neck: no JVD Lungs: decreased Rt base Heart: irregularly irregular rhythm Extremities: trace pitting edema Skin: pale, cool, dry Neurologic: Grossly normal   Rate: 82  Rhythm: atrial fibrillation, pacing on demand  Lab Results: No results for input(s): WBC, HGB, PLT in the last 72 hours.  Recent Labs  08/02/15 0304 08/03/15 0300  NA 135 138  K 4.0 4.3  CL 97* 100*  CO2 30 28  GLUCOSE 210* 164*  BUN 29* 28*  CREATININE 1.46* 1.37*   No results for input(s): TROPONINI in the last 72 hours.  Invalid input(s): CK, MB No results for input(s): INR in the last 72 hours.  Scheduled Meds: . allopurinol  300 mg Oral Daily  . apixaban  2.5 mg Oral BID  . atorvastatin  20 mg Oral q1800  . ciprofloxacin  500 mg Oral BID  . DULoxetine  20 mg Oral Daily  . furosemide  40 mg Oral Daily  . insulin aspart  0-15 Units Subcutaneous TID WC  . iron polysaccharides  150 mg Oral BID  . lisinopril  10 mg Oral QHS  . metoprolol tartrate  25 mg Oral Q8H  . montelukast  10 mg Oral QHS  . pantoprazole  40 mg Oral Daily  . sodium chloride  3 mL Intravenous Q12H   Continuous Infusions:  PRN Meds:.sodium chloride, acetaminophen, albuterol, HYDROcodone-acetaminophen, ondansetron (ZOFRAN) IV, senna-docusate, sodium  chloride   Imaging: Imaging results have been reviewed   Assessment/Plan:  81 y.o. female w/ PMH of chronic systolic CHF (EF 35-00%), mild CAD, HTN, HLD, DM, mild aortic stenosis, CKD stage III, symptomatic bradycardia (s/p PPM 2013), PAF s/p DCCV 07/25/15 (on Eliquis), admitted on 07/30/15 for worsening dyspnea and weight gain. She was just admitted with AF 07/11/15-07/28/15. That adm complicated by acute on CRI. Readmitted with recurrent AF and CHF.   Principal Problem:   Acute on chronic systolic heart failure  Active Problems:   Atrial fibrillation - 07/11/15 by PPM interrogation, recurrent 10/19 after DCCV 10/14   CKD (chronic kidney disease), stage III   Chronic systolic CHF    Anticoagulated- Eliquis   Cardiomyopathy - EF 35-40%   UTI (urinary tract infection)   DM, type 2, with renal complications    Dyslipidemia   Mild CAD - 20-30% RCA in 2013   Essential hypertension   History of pacemaker - Medtronic   Normochromic normocytic anemia   PLAN:  Rate control for AF- on Lopressor.  Change Lasix to 40 mg daily po (she was not on a diuretic prior to adm). Lisinopril changed to Q HS secondary to soft B/P (EF 35-40%). Should be OK for DC today. Early TOC f/u. Home on Cipro- f/u urine culture  as OP. She is in REDS study- will let Leota Sauers know.   Corine Shelter PA-C 08/03/2015, 7:33 AM [phone]   Patient seen and examined and history reviewed. Agree with above findings and plan. Didn't sleep well last night. Breathing is doing much better. Off oxygen. Weight down 11 lbs since admission. REDS study 63. Looks great from a volume standpoint. Plan to DC home today. Urine Cx. Pending. TOC follow up.   Darrick Greenlaw [place], MDFACC 08/03/2015 10:42 AM

## 2015-08-03 NOTE — Discharge Instructions (Signed)
Atrial Fibrillation °Atrial fibrillation is a type of irregular or rapid heartbeat (arrhythmia). In atrial fibrillation, the heart quivers continuously in a chaotic pattern. This occurs when parts of the heart receive disorganized signals that make the heart unable to pump blood normally. This can increase the risk for stroke, heart failure, and other heart-related conditions. There are different types of atrial fibrillation, including: °· Paroxysmal atrial fibrillation. This type starts suddenly, and it usually stops on its own shortly after it starts. °· Persistent atrial fibrillation. This type often lasts longer than a week. It may stop on its own or with treatment. °· Long-lasting persistent atrial fibrillation. This type lasts longer than 12 months. °· Permanent atrial fibrillation. This type does not go away. °Talk with your health care provider to learn about the type of atrial fibrillation that you have. °CAUSES °This condition is caused by some heart-related conditions or procedures, including: °· A heart attack. °· Coronary artery disease. °· Heart failure. °· Heart valve conditions. °· High blood pressure. °· Inflammation of the sac that surrounds the heart (pericarditis). °· Heart surgery. °· Certain heart rhythm disorders, such as Wolf-Parkinson-White syndrome. °Other causes include: °· Pneumonia. °· Obstructive sleep apnea. °· Blockage of an artery in the lungs (pulmonary embolism, or PE). °· Lung cancer. °· Chronic lung disease. °· Thyroid problems, especially if the thyroid is overactive (hyperthyroidism). °· Caffeine. °· Excessive alcohol use or illegal drug use. °· Use of some medicines, including certain decongestants and diet pills. °Sometimes, the cause cannot be found. °RISK FACTORS °This condition is more likely to develop in: °· People who are older in age. °· People who smoke. °· People who have diabetes mellitus. °· People who are overweight (obese). °· Athletes who exercise  vigorously. °SYMPTOMS °Symptoms of this condition include: °· A feeling that your heart is beating rapidly or irregularly. °· A feeling of discomfort or pain in your chest. °· Shortness of breath. °· Sudden light-headedness or weakness. °· Getting tired easily during exercise. °In some cases, there are no symptoms. °DIAGNOSIS °Your health care provider may be able to detect atrial fibrillation when taking your pulse. If detected, this condition may be diagnosed with: °· An electrocardiogram (ECG). °· A Holter monitor test that records your heartbeat patterns over a 24-hour period. °· Transthoracic echocardiogram (TTE) to evaluate how blood flows through your heart. °· Transesophageal echocardiogram (TEE) to view more detailed images of your heart. °· A stress test. °· Imaging tests, such as a CT scan or chest X-ray. °· Blood tests. °TREATMENT °The main goals of treatment are to prevent blood clots from forming and to keep your heart beating at a normal rate and rhythm. The type of treatment that you receive depends on many factors, such as your underlying medical conditions and how you feel when you are experiencing atrial fibrillation. °This condition may be treated with: °· Medicine to slow down the heart rate, bring the heart's rhythm back to normal, or prevent clots from forming. °· Electrical cardioversion. This is a procedure that resets your heart's rhythm by delivering a controlled, low-energy shock to the heart through your skin. °· Different types of ablation, such as catheter ablation, catheter ablation with pacemaker, or surgical ablation. These procedures destroy the heart tissues that send abnormal signals. When the pacemaker is used, it is placed under your skin to help your heart beat in a regular rhythm. °HOME CARE INSTRUCTIONS °· Take over-the counter and prescription medicines only as told by your health care provider. °·   If your health care provider prescribed a blood-thinning medicine  (anticoagulant), take it exactly as told. Taking too much blood-thinning medicine can cause bleeding. If you do not take enough blood-thinning medicine, you will not have the protection that you need against stroke and other problems.  Do not use tobacco products, including cigarettes, chewing tobacco, and e-cigarettes. If you need help quitting, ask your health care provider.  If you have obstructive sleep apnea, manage your condition as told by your health care provider.  Do not drink alcohol.  Do not drink beverages that contain caffeine, such as coffee, soda, and tea.  Maintain a healthy weight. Do not use diet pills unless your health care provider approves. Diet pills may make heart problems worse.  Follow diet instructions as told by your health care provider.  Exercise regularly as told by your health care provider.  Keep all follow-up visits as told by your health care provider. This is important. PREVENTION  Avoid drinking beverages that contain caffeine or alcohol.  Avoid certain medicines, especially medicines that are used for breathing problems.  Avoid certain herbs and herbal medicines, such as those that contain ephedra or ginseng.  Do not use illegal drugs, such as cocaine and amphetamines.  Do not smoke.  Manage your high blood pressure. SEEK MEDICAL CARE IF:  You notice a change in the rate, rhythm, or strength of your heartbeat.  You are taking an anticoagulant and you notice increased bruising.  You tire more easily when you exercise or exert yourself. SEEK IMMEDIATE MEDICAL CARE IF:  You have chest pain, abdominal pain, sweating, or weakness.  You feel nauseous.  You notice blood in your vomit, bowel movement, or urine.  You have shortness of breath.  You suddenly have swollen feet and ankles.  You feel dizzy.  You have sudden weakness or numbness of the face, arm, or leg, especially on one side of the body.  You have trouble speaking,  trouble understanding, or both (aphasia).  Your face or your eyelid droops on one side. These symptoms may represent a serious problem that is an emergency. Do not wait to see if the symptoms will go away. Get medical help right away. Call your local emergency services (911 in the [place] Do not drive yourself to the hospital.   This information is not intended to replace advice given to you by your health care provider. Make sure you discuss any questions you have with your health care provider.   Document Released: 09/27/2005 Document Revised: 06/18/2015 Document Reviewed: 01/22/2015 Elsevier Interactive Patient Education 2016 [employer] Heart Failure Heart failure means your heart has trouble pumping blood. This makes it hard for your body to work well. Heart failure is usually a long-term (chronic) condition. You must take good care of yourself and follow your doctor's treatment plan. HOME CARE Take your heart medicine as told by your doctor. Do not stop taking medicine unless your doctor tells you to. Do not skip any dose of medicine. Refill your medicines before they run out. Take other medicines only as told by your doctor or pharmacist. Stay active if told by your doctor. The elderly and people with severe heart failure should talk with a doctor about physical activity. Eat heart-healthy foods. Choose foods that are without trans fat and are low in saturated fat, cholesterol, and salt (sodium). This includes fresh or frozen fruits and vegetables, fish, lean meats, fat-free or low-fat dairy foods, whole grains, and high-fiber foods. Lentils and dried peas and  beans (legumes) are also good choices. Limit salt if told by your doctor. Cook in a healthy way. Roast, grill, broil, bake, poach, steam, or stir-fry foods. Limit fluids as told by your doctor. Weigh yourself every morning. Do this after you pee (urinate) and before you eat breakfast. Write down your weight to give to your  doctor. Take your blood pressure and write it down if your doctor tells you to. Ask your doctor how to check your pulse. Check your pulse as told. Lose weight if told by your doctor. Stop smoking or chewing tobacco. Do not use gum or patches that help you quit without your doctor's approval. Schedule and go to doctor visits as told. Nonpregnant women should have no more than 1 drink a day. Men should have no more than 2 drinks a day. Talk to your doctor about drinking alcohol. Stop illegal drug use. Stay current with shots (immunizations). Manage your health conditions as told by your doctor. Learn to manage your stress. Rest when you are tired. If it is really hot outside: Avoid intense activities. Use air conditioning or fans, or get in a cooler place. Avoid caffeine and alcohol. Wear loose-fitting, lightweight, and light-colored clothing. If it is really cold outside: Avoid intense activities. Layer your clothing. Wear mittens or gloves, a hat, and a scarf when going outside. Avoid alcohol. Learn about heart failure and get support as needed. Get help to maintain or improve your quality of life and your ability to care for yourself as needed. GET HELP IF:  You gain weight quickly. You are more short of breath than usual. You cannot do your normal activities. You tire easily. You cough more than normal, especially with activity. You have any or more puffiness (swelling) in areas such as your hands, feet, ankles, or belly (abdomen). You cannot sleep because it is hard to breathe. You feel like your heart is beating fast (palpitations). You get dizzy or light-headed when you stand up. GET HELP RIGHT AWAY IF:  You have trouble breathing. There is a change in mental status, such as becoming less alert or not being able to focus. You have chest pain or discomfort. You faint. MAKE SURE YOU:  Understand these instructions. Will watch your condition. Will get help right away if  you are not doing well or get worse.   This information is not intended to replace advice given to you by your health care provider. Make sure you discuss any questions you have with your health care provider.   Document Released: 07/06/2008 Document Revised: 10/18/2014 Document Reviewed: 11/13/2012 Elsevier Interactive Patient Education [employer]

## 2015-08-04 ENCOUNTER — Inpatient Hospital Stay (HOSPITAL_COMMUNITY)
Admission: EM | Admit: 2015-08-04 | Discharge: 2015-08-11 | DRG: 291 | Disposition: A | Discharge status: Home / Self Care | Payer: Medicare Other | Attending: Cardiology | Admitting: Cardiology

## 2015-08-04 ENCOUNTER — Telehealth: Payer: Self-pay | Admitting: Cardiology

## 2015-08-04 ENCOUNTER — Telehealth: Payer: Self-pay | Admitting: Physician Assistant

## 2015-08-04 ENCOUNTER — Other Ambulatory Visit: Payer: Self-pay | Admitting: *Deleted

## 2015-08-04 ENCOUNTER — Emergency Department (HOSPITAL_COMMUNITY): Payer: Medicare Other

## 2015-08-04 ENCOUNTER — Encounter (HOSPITAL_COMMUNITY): Payer: Self-pay | Admitting: Vascular Surgery

## 2015-08-04 DIAGNOSIS — N390 Urinary tract infection, site not specified: Secondary | ICD-10-CM | POA: Diagnosis present

## 2015-08-04 DIAGNOSIS — Z7901 Long term (current) use of anticoagulants: Secondary | ICD-10-CM

## 2015-08-04 DIAGNOSIS — M1990 Unspecified osteoarthritis, unspecified site: Secondary | ICD-10-CM | POA: Diagnosis not present

## 2015-08-04 DIAGNOSIS — D696 Thrombocytopenia, unspecified: Secondary | ICD-10-CM | POA: Diagnosis present

## 2015-08-04 DIAGNOSIS — I130 Hypertensive heart and chronic kidney disease with heart failure and stage 1 through stage 4 chronic kidney disease, or unspecified chronic kidney disease: Principal | ICD-10-CM | POA: Diagnosis present

## 2015-08-04 DIAGNOSIS — I481 Persistent atrial fibrillation: Secondary | ICD-10-CM | POA: Diagnosis not present

## 2015-08-04 DIAGNOSIS — E119 Type 2 diabetes mellitus without complications: Secondary | ICD-10-CM | POA: Diagnosis not present

## 2015-08-04 DIAGNOSIS — I4891 Unspecified atrial fibrillation: Secondary | ICD-10-CM | POA: Diagnosis not present

## 2015-08-04 DIAGNOSIS — L309 Dermatitis, unspecified: Secondary | ICD-10-CM | POA: Diagnosis present

## 2015-08-04 DIAGNOSIS — E1122 Type 2 diabetes mellitus with diabetic chronic kidney disease: Secondary | ICD-10-CM | POA: Diagnosis present

## 2015-08-04 DIAGNOSIS — I5023 Acute on chronic systolic (congestive) heart failure: Secondary | ICD-10-CM | POA: Diagnosis present

## 2015-08-04 DIAGNOSIS — I350 Nonrheumatic aortic (valve) stenosis: Secondary | ICD-10-CM | POA: Diagnosis not present

## 2015-08-04 DIAGNOSIS — I429 Cardiomyopathy, unspecified: Secondary | ICD-10-CM | POA: Diagnosis present

## 2015-08-04 DIAGNOSIS — I5021 Acute systolic (congestive) heart failure: Secondary | ICD-10-CM | POA: Diagnosis not present

## 2015-08-04 DIAGNOSIS — D509 Iron deficiency anemia, unspecified: Secondary | ICD-10-CM | POA: Diagnosis not present

## 2015-08-04 DIAGNOSIS — R1310 Dysphagia, unspecified: Secondary | ICD-10-CM | POA: Diagnosis not present

## 2015-08-04 DIAGNOSIS — N179 Acute kidney failure, unspecified: Secondary | ICD-10-CM | POA: Diagnosis not present

## 2015-08-04 DIAGNOSIS — E785 Hyperlipidemia, unspecified: Secondary | ICD-10-CM | POA: Diagnosis present

## 2015-08-04 DIAGNOSIS — R000 Tachycardia, unspecified: Secondary | ICD-10-CM | POA: Diagnosis not present

## 2015-08-04 DIAGNOSIS — I428 Other cardiomyopathies: Secondary | ICD-10-CM | POA: Diagnosis present

## 2015-08-04 DIAGNOSIS — N183 Chronic kidney disease, stage 3 (moderate): Secondary | ICD-10-CM | POA: Diagnosis present

## 2015-08-04 DIAGNOSIS — I129 Hypertensive chronic kidney disease with stage 1 through stage 4 chronic kidney disease, or unspecified chronic kidney disease: Secondary | ICD-10-CM | POA: Diagnosis not present

## 2015-08-04 DIAGNOSIS — I480 Paroxysmal atrial fibrillation: Secondary | ICD-10-CM | POA: Diagnosis present

## 2015-08-04 DIAGNOSIS — K449 Diaphragmatic hernia without obstruction or gangrene: Secondary | ICD-10-CM | POA: Diagnosis not present

## 2015-08-04 DIAGNOSIS — Z950 Presence of cardiac pacemaker: Secondary | ICD-10-CM | POA: Diagnosis not present

## 2015-08-04 DIAGNOSIS — I951 Orthostatic hypotension: Secondary | ICD-10-CM | POA: Diagnosis present

## 2015-08-04 DIAGNOSIS — K224 Dyskinesia of esophagus: Secondary | ICD-10-CM | POA: Diagnosis not present

## 2015-08-04 DIAGNOSIS — E1169 Type 2 diabetes mellitus with other specified complication: Secondary | ICD-10-CM | POA: Diagnosis not present

## 2015-08-04 DIAGNOSIS — Z794 Long term (current) use of insulin: Secondary | ICD-10-CM

## 2015-08-04 DIAGNOSIS — I2510 Atherosclerotic heart disease of native coronary artery without angina pectoris: Secondary | ICD-10-CM | POA: Diagnosis not present

## 2015-08-04 DIAGNOSIS — R1314 Dysphagia, pharyngoesophageal phase: Secondary | ICD-10-CM | POA: Diagnosis not present

## 2015-08-04 DIAGNOSIS — R079 Chest pain, unspecified: Secondary | ICD-10-CM | POA: Diagnosis not present

## 2015-08-04 DIAGNOSIS — J849 Interstitial pulmonary disease, unspecified: Secondary | ICD-10-CM | POA: Diagnosis not present

## 2015-08-04 HISTORY — DX: Dyskinesia of esophagus: K22.4

## 2015-08-04 LAB — BRAIN NATRIURETIC PEPTIDE: B Natriuretic Peptide: 3168.4 pg/mL — ABNORMAL HIGH (ref 0.0–100.0)

## 2015-08-04 LAB — BASIC METABOLIC PANEL
Anion gap: 12 (ref 5–15)
BUN: 29 mg/dL — ABNORMAL HIGH (ref 6–20)
CALCIUM: 9.5 mg/dL (ref 8.9–10.3)
CHLORIDE: 100 mmol/L — AB (ref 101–111)
CO2: 30 mmol/L (ref 22–32)
CREATININE: 1.52 mg/dL — AB (ref 0.44–1.00)
GFR calc non Af Amer: 31 mL/min — ABNORMAL LOW (ref >60)
GFR, EST AFRICAN AMERICAN: 36 mL/min — AB (ref >60)
GLUCOSE: 188 mg/dL — AB (ref 65–99)
Potassium: 4.1 mmol/L (ref 3.5–5.1)
SODIUM: 142 mmol/L (ref 135–145)

## 2015-08-04 LAB — CBC
HCT: 34.8 % — ABNORMAL LOW (ref 36.0–46.0)
HEMOGLOBIN: 10.6 g/dL — AB (ref 12.0–15.0)
MCH: 25.1 pg — AB (ref 26.0–34.0)
MCHC: 30.5 g/dL (ref 30.0–36.0)
MCV: 82.3 fL (ref 78.0–100.0)
Platelets: 154 10*3/uL (ref 150–400)
RBC: 4.23 MIL/uL (ref 3.87–5.11)
RDW: 23 % — ABNORMAL HIGH (ref 11.5–15.5)
WBC: 8.3 10*3/uL (ref 4.0–10.5)

## 2015-08-04 LAB — I-STAT TROPONIN, ED: TROPONIN I, POC: 0.03 ng/mL (ref 0.00–0.08)

## 2015-08-04 MED ORDER — AMIODARONE HCL IN DEXTROSE 360-4.14 MG/200ML-% IV SOLN
60.0000 mg/h | INTRAVENOUS | Status: AC
  Administered 2015-08-04 – 2015-08-05 (×2): 60 mg/h via INTRAVENOUS
  Filled 2015-08-04 (×2): qty 200

## 2015-08-04 MED ORDER — AMIODARONE HCL IN DEXTROSE 360-4.14 MG/200ML-% IV SOLN
30.0000 mg/h | INTRAVENOUS | Status: DC
  Administered 2015-08-05 – 2015-08-08 (×6): 30 mg/h via INTRAVENOUS
  Filled 2015-08-04 (×7): qty 200

## 2015-08-04 MED ORDER — AMIODARONE LOAD VIA INFUSION
150.0000 mg | Freq: Once | INTRAVENOUS | Status: AC
  Administered 2015-08-05: 150 mg via INTRAVENOUS
  Filled 2015-08-04: qty 83.34

## 2015-08-04 MED ORDER — FUROSEMIDE 10 MG/ML IJ SOLN
40.0000 mg | Freq: Once | INTRAMUSCULAR | Status: AC
  Administered 2015-08-04: 40 mg via INTRAVENOUS
  Filled 2015-08-04: qty 4

## 2015-08-04 NOTE — Telephone Encounter (Addendum)
Called [place]. She reports "short of breath at times", "uncomfortable" (describes not being able to sleep well, "can't get rested"). She reports gagging on food when she eats. She reports these symptoms have been more or less present over her last 2 hospitalizations (both recent) and is not sure they have been addressed.  Of note, she was discharged from hospital 10/23 (discharge note by Corine Shelter) - instructed to f/u w/ [place] and that we will contact.  She should be in call list for TOC. I reached out to [facility] scheduling Amelia Jo), left message to inquire on whether we can accomodate for a flex appt or same-week TOC.  Pt requests we follow up w/ her daughter [name] to schedule visit.

## 2015-08-04 NOTE — ED Provider Notes (Signed)
CSN: [id number]     Arrival date & time 08/04/15  1831 History   First MD Initiated Contact with Patient 08/04/15 1942     Chief Complaint  Patient presents with  . Chest Pain    HPI Patient presents complaining of recent discharge from hospital for similar symptoms of SOB and decreased exercise intolerance.  Symptoms have been present for worsening over 2 dayse. The symptoms are made worse by exertional.. The symptoms are made better by rest. The patient has tried the following therapies: lasix and Afib medications. The effect of these therapies has been continued progression.  The patient has a significant past medical history for several cardioversions and Hx of LHC with pacemaker in place.   Past Medical History  Diagnosis Date  . Gout   . Osteoarthritis   . Bursitis   . S/P cardiac pacemaker procedure, PPM Medtronic REVO placed 01/11/2012 01/11/2012    a. for symptomatic bradycardia. Followed by Dr. Ladona Ridgel.  . Personal history of fall   . Chronic kidney disease, stage II (mild)   . Memory loss   . Anxiety state, unspecified   . Other and unspecified hyperlipidemia   . Anemia, unspecified   . Abnormality of gait   . Depressive disorder, not elsewhere classified   . Rheumatism, unspecified and fibrositis   . Lumbago   . Nonspecific abnormal results of liver function study   . Obesity, unspecified   . Internal hemorrhoids without mention of complication   . Viremia, unspecified   . Osteoarthrosis, unspecified whether generalized or localized, unspecified site   . Abnormality of gait   . Viremia, unspecified   . Rheumatism, unspecified and fibrositis   . Pain in joint, shoulder region 11/22/2013    leeft   . Pruritus 11/22/2013  . Eczema 11/22/2013  . Full dentures   . HOH (hard of hearing)   . Wears glasses   . Female stress incontinence 05/06/2015  . DM (diabetes mellitus), type 2, uncontrolled, with renal complications (HCC) 01/08/2012  . CKD (chronic kidney disease),  stage III   . Aortic stenosis     a. Mild by echo 08/2013.  . Essential hypertension   . Orthostatic hypotension   . Bifascicular block   . Chronic systolic CHF (congestive heart failure) (HCC)     a. Dx 07/2015 - EF 35-40%, unclear etiology  . Mild CAD     a. Cath 2013: 20-30% prox RCA.  . Presence of permanent cardiac pacemaker   . Anticoagulated- Eliquis 07/31/2015  . Atrial fibrillation - developed 07/11/15 by PPM interrogation, recurrent after DCCV 10/14 07/11/2015     recurrent after DCCV 10/14   Past Surgical History  Procedure Laterality Date  . Extremity cyst excision    . Cardiac catheterization  12/03/2003    Dr Clarene Duke  . Colonoscopy    . Orif wrist fracture Right 04/09/2014    Procedure: OPEN REDUCTION INTERNAL FIXATION (ORIF) RIGHT DISPLACED  DISTAL RADIUS  FRACTURE;  Surgeon: Dominica Severin, MD;  Location: [facility];  Service: Orthopedics;  Laterality: Right;  . Left heart catheterization with coronary angiogram N/A 01/10/2012    Procedure: LEFT HEART CATHETERIZATION WITH CORONARY ANGIOGRAM;  Surgeon: Chrystie Nose, MD;  Location: [facility] CATH LAB;  Service: Cardiovascular;  Laterality: N/A;  . Permanent pacemaker insertion Left 01/11/2012    Procedure: PERMANENT PACEMAKER INSERTION;  Surgeon: Thurmon Fair, MD;  Location: [facility] CATH LAB;  Service: Cardiovascular;  Laterality: Left;  . Tee without cardioversion N/A 07/25/2015  Procedure: TRANSESOPHAGEAL ECHOCARDIOGRAM (TEE);  Surgeon: Pricilla Riffle, MD;  Location: [facility] ENDOSCOPY;  Service: Cardiovascular;  Laterality: N/A;  . Cardioversion N/A 07/25/2015    Procedure: CARDIOVERSION;  Surgeon: Pricilla Riffle, MD;  Location: [facility] ENDOSCOPY;  Service: Cardiovascular;  Laterality: N/A;  . Cataract extraction w/ intraocular lens  implant, bilateral Bilateral 03/2015-04/2015   Family History  Problem Relation Age of Onset  . Breast cancer Mother   . Cancer Mother   . Diabetes Daughter   . Hypertension Daughter   . Cancer  Brother    Social History  Substance Use Topics  . Smoking status: Never Smoker   . Smokeless tobacco: Never Used  . Alcohol Use: No   OB History    No data available     Review of Systems  Constitutional: Positive for fatigue.  Respiratory: Positive for shortness of breath.   All other systems reviewed and are negative.     Allergies  Beta adrenergic blockers; Jardiance; Fluzone; and Tetanus toxoids  Home Medications   Prior to Admission medications   Medication Sig Start Date End Date Taking? Authorizing Provider  albuterol (PROVENTIL HFA;VENTOLIN HFA) 108 (90 BASE) MCG/ACT inhaler Inhale 2 puffs into the lungs every 6 (six) hours as needed for wheezing or shortness of breath. 06/19/15  Yes Sharon Seller, NP  allopurinol (ZYLOPRIM) 300 MG tablet Take 1 tablet (300 mg total) by mouth daily. 06/20/15  Yes Kimber Relic, MD  apixaban (ELIQUIS) 2.5 MG TABS tablet Take 1 tablet (2.5 mg total) by mouth 2 (two) times daily. 07/28/15  Yes Rhonda G Barrett, PA-C  atorvastatin (LIPITOR) 40 MG tablet Take 0.5 tablets (20 mg total) by mouth daily at 6 PM. 06/20/15 06/25/16 Yes Kimber Relic, MD  ciprofloxacin (CIPRO) 500 MG tablet Take 1 tablet (500 mg total) by mouth 2 (two) times daily. 08/03/15  Yes Luke K Kilroy, PA-C  DULoxetine (CYMBALTA) 20 MG capsule Take 1 capsule (20 mg total) by mouth at bedtime. Take 1 tablet by mouth daily to help treat anxiety and nerves. 08/03/15  Yes Luke K Kilroy, PA-C  furosemide (LASIX) 40 MG tablet Take 1 tablet (40 mg total) by mouth daily. 08/03/15  Yes Luke K Kilroy, PA-C  Influenza vac split quadrivalent PF (FLUARIX) 0.5 ML injection Inject 0.5 mLs into the muscle once.   Yes Historical Provider, MD  iron polysaccharides (NU-IRON) 150 MG capsule Take 1 capsule (150 mg total) by mouth 2 (two) times daily. 07/28/15  Yes Rhonda G Barrett, PA-C  lisinopril (PRINIVIL,ZESTRIL) 20 MG tablet Take 0.5 tablets (10 mg total) by mouth at bedtime. Take 1/2 tablet  by mouth once daily for blood pressure 08/03/15  Yes Luke K Kilroy, PA-C  metoprolol tartrate (LOPRESSOR) 25 MG tablet Take 1 tablet (25 mg total) by mouth 2 (two) times daily. 07/17/15  Yes Dayna N Placide, PA-C  montelukast (SINGULAIR) 10 MG tablet Take 1 tablet (10 mg total) by mouth at bedtime. 06/19/15  Yes Sharon Seller, NP  pantoprazole (PROTONIX) 40 MG tablet Take 1 tablet (40 mg total) by mouth daily. 06/19/15  Yes Sharon Seller, NP  saxagliptin HCl (ONGLYZA) 2.5 MG TABS tablet Take 1 tablet (2.5 mg total) by mouth daily. Take one tablet by mouth once daily for blood sugar 07/28/15  Yes Rhonda G Barrett, PA-C  senna-docusate (SENOKOT-S) 8.6-50 MG tablet Take 1 tablet by mouth daily as needed for mild constipation. 07/28/15  Yes Rhonda G Barrett, PA-C  triamcinolone (KENALOG) 0.025 %  cream Apply 1 application topically 4 (four) times daily as needed (itching).   Yes Historical Provider, MD  acetaminophen (TYLENOL) 325 MG tablet Take 2 tablets (650 mg total) by mouth every 4 (four) hours as needed for headache or mild pain. 08/03/15   Luke K Kilroy, PA-C   BP 104/67 mmHg  Pulse 100  Temp(Src) 98 F (36.7 C) (Oral)  Resp 18  Ht  (1.575 m)  Wt 166 lb 7.2 oz (75.5 kg)  BMI 30.44 kg/m2  SpO2 100% Physical Exam  Constitutional: She is oriented to person, place, and time. She appears well-developed.  Non-toxic appearance. She does not appear ill. She appears distressed.  HENT:  Head: Normocephalic and atraumatic.  Right Ear: External ear normal.  Left Ear: External ear normal.  Eyes: Pupils are equal, round, and reactive to light. No scleral icterus.  Neck: Normal range of motion. Neck supple. JVD present. No tracheal deviation present.  Cardiovascular: Intact distal pulses.   No murmur heard. Pulmonary/Chest: No stridor. No respiratory distress. She has no wheezes. She has rales.  Abdominal: Soft. Bowel sounds are normal. She exhibits no distension. There is no tenderness. There  is no rebound and no guarding.  Musculoskeletal: Normal range of motion. She exhibits edema.  Neurological: She is alert and oriented to person, place, and time. She has normal strength and normal reflexes. No cranial nerve deficit or sensory deficit.  Skin: Skin is warm and dry. No pallor.  Psychiatric: She has a normal mood and affect. Her behavior is normal.    ED Course  Procedures (including critical care time) Labs Review Labs Reviewed  BASIC METABOLIC PANEL - Abnormal; Notable for the following:    Chloride 100 (*)    Glucose, Bld 188 (*)    BUN 29 (*)    Creatinine, Ser 1.52 (*)    GFR calc non Af Amer 31 (*)    GFR calc Af Amer 36 (*)    All other components within normal limits  CBC - Abnormal; Notable for the following:    Hemoglobin 10.6 (*)    HCT 34.8 (*)    MCH 25.1 (*)    RDW 23.0 (*)    All other components within normal limits  BRAIN NATRIURETIC PEPTIDE - Abnormal; Notable for the following:    B Natriuretic Peptide 3168.4 (*)    All other components within normal limits  TROPONIN I - Abnormal; Notable for the following:    Troponin I 0.05 (*)    All other components within normal limits  GLUCOSE, CAPILLARY - Abnormal; Notable for the following:    Glucose-Capillary 154 (*)    All other components within normal limits  MRSA PCR SCREENING  TSH  BASIC METABOLIC PANEL  TROPONIN I  TROPONIN I  I-STAT TROPOININ, ED    Imaging Review Dg Chest 2 View  08/04/2015  CLINICAL DATA:  Patient with history of atrial fibrillation. Chest pain and heaviness. EXAM: CHEST  2 VIEW COMPARISON:  Chest radiograph 07/30/2015 FINDINGS: Multi lead pacer apparatus overlies the left hemi thorax, leads are stable in position. Stable cardiomegaly. Pulmonary vascular redistribution with mild interstitial pulmonary opacities. No large area of pulmonary consolidation. Mid thoracic spine degenerative changes. No pleural effusion pneumothorax. IMPRESSION: Cardiomegaly, pulmonary vascular  redistribution and mild interstitial edema. Electronically Signed   By: Annia Belt M.D.   On: 08/04/2015 19:37   I have personally reviewed and evaluated these images and lab results as part of my medical decision-making.  EKG Interpretation   Date/Time:  Monday August 04 2015 18:41:02 EDT Ventricular Rate:  122 PR Interval:    QRS Duration: 138 QT Interval:  354 QTC Calculation: 504 R Axis:   -74 Text Interpretation:  Atrial fibrillation with rapid ventricular response  with premature ventricular or aberrantly conducted complexes Right bundle  branch block Left anterior fascicular block Abnormal ECG No significant  change since last tracing Confirmed by Rhunette Croft, MD, Janey Genta [phone]) on  08/04/2015 8:13:04 PM      MDM   [name] is a 81 y.o. female with H&P as above. ED clinical course as follows:  Patient returns in chronic CHF with reduced EF per last echo. Patient without ever recent or remotely active chest discomfort.   DDX includes Pulmonary Embolus, Aortic Dissection, Pneumothorax, Pneumonia, ACS/MI, Perforated Abdominal Viscous, Myocarditis, and Pericarditis and worsening of CHF.  Without peritoneal signs, rebound, or guarding on exam; do not suspect Intraabdominal source.  ECG is with known Afib without RVR.  Without anatomical ST segment or T wave changes of myocardial ischemia.  No evidence of Pericarditis.  Without leukocytosis, cough, or fever. Chest x-ray was done and evaluated by me, which showed no signs of focal airspace disease or infiltrate concerning for Pneumonia. Without Pneumothorax.  Clinical Impression:  1. Acute systolic congestive heart failure (HCC)    Disposition:  Because of the age and risk factors of the patient, patient requires further inpatient management for CHF.   Laboratory and Imaging results were personally reviewed by myself and used in the medical decision making of this patient's treatment and disposition. I also reviewed  Radiology's interpretation of Imaging.   Patient care discussed with Dr. Rhunette Croft, who oversaw their evaluation & treatment & voiced agreement. House Officer: Jonette Eva, MD, Emergency Medicine.    Jonette Eva, MD 08/05/15 [id number]  Jonette Eva, MD 08/05/15 4098  Derwood Kaplan, MD 08/05/15 1191

## 2015-08-04 NOTE — Telephone Encounter (Signed)
Please call asap.pt complaining of heaviness in her chest,jus feels funny.

## 2015-08-04 NOTE — Telephone Encounter (Signed)
No answer x2 

## 2015-08-04 NOTE — Telephone Encounter (Signed)
TCM changed to 08/14/15 at 2:40pm w/ Tereso Newcomer.  Called to inform, NA when dialed, no VM.

## 2015-08-04 NOTE — ED Notes (Signed)
Mini lab running istat troponin.

## 2015-08-04 NOTE — Progress Notes (Signed)
ANTICOAGULATION CONSULT NOTE - Initial Consult  Pharmacy Consult for Eliquis Indication: atrial fibrillation  Allergies  Allergen Reactions  . Beta Adrenergic Blockers Other (See Comments)    Couldn't speak or hear Metoprolol seems ok  . Jardiance [Empagliflozin] Other (See Comments)    Rash, kidney issues  . Fluzone [Flu Virus Vaccine] Other (See Comments)    Feeling unwell for a week. Cold symptoms/flu symtoms, can still take vaccine  . Tetanus Toxoids Other (See Comments)    UNKNOWN    Patient Measurements: Weight: 76.7kg  Vital Signs: Temp: 97.9 F (36.6 C) (10/24 1846) Temp Source: Oral (10/24 1846) BP: 121/78 mmHg (10/24 2315) Pulse Rate: 133 (10/24 2245)  Labs:  Recent Labs  08/02/15 0304 08/03/15 0300 08/04/15 1942  HGB  --   --  10.6*  HCT  --   --  34.8*  PLT  --   --  154  CREATININE 1.46* 1.37* 1.52*    Estimated Creatinine Clearance: 28.5 mL/min (by C-G formula based on Cr of 1.52).   Medical History: Past Medical History  Diagnosis Date  . Gout   . Osteoarthritis   . Bursitis   . S/P cardiac pacemaker procedure, PPM Medtronic REVO placed 01/11/2012 01/11/2012    a. for symptomatic bradycardia. Followed by Dr. Ladona Ridgel.  . Personal history of fall   . Chronic kidney disease, stage II (mild)   . Memory loss   . Anxiety state, unspecified   . Other and unspecified hyperlipidemia   . Anemia, unspecified   . Abnormality of gait   . Depressive disorder, not elsewhere classified   . Rheumatism, unspecified and fibrositis   . Lumbago   . Nonspecific abnormal results of liver function study   . Obesity, unspecified   . Internal hemorrhoids without mention of complication   . Viremia, unspecified   . Osteoarthrosis, unspecified whether generalized or localized, unspecified site   . Abnormality of gait   . Viremia, unspecified   . Rheumatism, unspecified and fibrositis   . Pain in joint, shoulder region 11/22/2013    leeft   . Pruritus 11/22/2013  .  Eczema 11/22/2013  . Full dentures   . HOH (hard of hearing)   . Wears glasses   . Female stress incontinence 05/06/2015  . DM (diabetes mellitus), type 2, uncontrolled, with renal complications (HCC) 01/08/2012  . CKD (chronic kidney disease), stage III   . Aortic stenosis     a. Mild by echo 08/2013.  . Essential hypertension   . Orthostatic hypotension   . Bifascicular block   . Chronic systolic CHF (congestive heart failure) (HCC)     a. Dx 07/2015 - EF 35-40%, unclear etiology  . Mild CAD     a. Cath 2013: 20-30% prox RCA.  . Presence of permanent cardiac pacemaker   . Anticoagulated- Eliquis 07/31/2015  . Atrial fibrillation - developed 07/11/15 by PPM interrogation, recurrent after DCCV 10/14 07/11/2015     recurrent after DCCV 10/14     Assessment/Plan:  81yo female c/o CP and heaviness, was recently cardioverted to NSR, now w/ irregular HR in 120s, starting amio, to continue Eliquis.  Will continue Eliquis 2.5mg  BID and monitor CBC and care plan.  Vernard Gambles, PharmD, BCPS  08/04/2015,11:58 PM

## 2015-08-04 NOTE — Telephone Encounter (Signed)
No answer when dialed, x2

## 2015-08-04 NOTE — Telephone Encounter (Signed)
Returned call to [place]. She reports patient has been having a lot of anxiety since leaving the hospital, not sure how to address - she is concerned because the patient states she "feels funny", c/o heaviness in chest. [name] states patient had O2 via [place] in hospital and wants it at home - but there wasn't really a need for it - sats were 97-100% on RA  Will call patient and get further information regarding her complaints.

## 2015-08-04 NOTE — Patient Outreach (Addendum)
Attempt made to contact pt for transition of care (discharged 10/23).   Message for  number [phone] called said  service not been established.   Will try another home phone number provided for contacts = [phone].     Shayne Alken.   Pierzchala RN CCM [facility] Care Management  [phone]

## 2015-08-04 NOTE — Telephone Encounter (Signed)
TCM phone call.Marland Kitchen Appt  On 08/25/15 at 2:40pm w/ Tereso Newcomer at the [facility] .Marland Kitchen  Thanks

## 2015-08-04 NOTE — ED Notes (Addendum)
Pt reports to the ED for eval of CP and heaviness. She reports she was just d/c from the hospital for the same. She has a hx of a. Fib and was cardioverted to NSR last time. She reports associated symptoms of SOB, lightheadedness, and nausea. Pt reports some diaphoretic spell last pm. She is A&Ox4, resp e/u, and skin warm and dry. Denies any CP at this time. HR in the 120s and irregular.

## 2015-08-04 NOTE — ED Notes (Signed)
Spoke with main lab - they will add on BNP. 

## 2015-08-04 NOTE — Telephone Encounter (Signed)
Returned call to [facility] earlier - she had been in touch w/ someone else from our office (did not make it clear what this was about. i do not see other call documentation).  Pt TOC was initially scheduled for 11/14. I rechecked and this had been moved to 11/3.   Attempted to call back to [facility] to update on reschedule, no answer when dialed.

## 2015-08-04 NOTE — ED Notes (Signed)
Cards at bedside

## 2015-08-04 NOTE — Telephone Encounter (Signed)
Patient's daughter [name] called answering service. Home health came out today and reported that patient had a lot of fluid on board, HR was up, having chest pressure and intermittent difficulty swallowing. They are currently getting checked into the ER. [name] was wondering if there is any way to move up the process of getting her to an ER bed so she doesn't have to sit in the waiting room. I told her unfortunately the ER has their own triage process and I have no personal control over bedding patients. I did ask her to express her concerns to the triage staff.   She also was wondering about two things: 1) seeing [facility] this admission for possible w/u of difficulty swallowing (does not want to see team that came by during recent admission) 2) having Dr. Gala Romney see the patient while she is admitted as they heard he is CHF specialist.  I told her I would forward to the PA coming on in the morning so he could pass on these concerns to the team that will be assigned to round on her mother, once she has been admitted. [name] verbalized understanding and gratitude.  Dayna Lowenthal PA-C

## 2015-08-04 NOTE — H&P (Addendum)
Physician History and Physical    VALEEN BORYS MRN: [id number] DOB/AGE: [birth year] 81 y.o. Admit date: 08/04/2015  Primary Care Physician: Dr. Murray Hodgkins Primary Cardiologist: Dr. Jens Som  HPI: 81 yo with history of HTN but also orthostatic hypotension, symptomatic bradycardia with Medtronic PPM, paroxysmal atrial fibrillation chronic systolic CHF, CKD stage III, and iron deficiency anemia presents to ER with worsening dyspnea and weight gain.  Around the beginning of 9/16, she began to develop exertional dyspnea.  She was seen in the cardiology office on 07/25/15.  She was in atrial fibrillation with RVR and echo showed EF 35-40% (had been 50-55% in past).  PPM interrogation showed that atrial fibrillation had begun 07/11/15, so exertional dyspnea had preceded this by about 4 weeks.  She was admitted from 10/7-10/17.  She was anemic with hemoglobin 8.7.  She was heme negative but Fe deficient.  EGD was not done.  She had TEE-guided DCCV to NSR.  She also had AKI with creatinine up to 3.41, but it trended back down.  She was sent home in NSR.  She was readmitted 10/19-10/23 with recurrent atrial fibrillation and acute on chronic systolic CHF.  She was diuresed about 10 lbs.  It was decided to rate control her rather than repeat cardioversion, and she was sent home.  She was started on Cipro to treat a UTI.   Unfortunately, ever since arriving home, she has become steadily more short of breath.  Today, she was short of breath just walking around her house.  She has 3-pillow orthopnea and PND.  Some chest heaviness that is not related to exertion.  No lightheadedness or syncope.  BP stable but HR in the 110s-130s currently. She has not missed any Eliquis doses.   Review of systems complete and found to be negative unless listed above   PMH: 1. HTN but also with history of orthostatic hypotension. 2. CKD stage III 3. Aortic stenosis: Mild 4. Symptomatic bradycardia s/p Medtronic PPM. 5.  Hyperlipidemia 6. Atrial fibrillation: Paroxysmal.  TEE-guided DCCV in 10/16.  7. Type II diabetes 8. Gout 9. Fe-deficiency anemia 10. Chronic systolic CHF: Echo 11/14 with EF 50-55%.  Echo (10/16) with EF 35-40%, moderate LV dilation, diffuse hypokinesis, mild AS, moderate MR, PASP 48 mmHg.   Family History  Problem Relation Age of Onset  . Breast cancer Mother   . Cancer Mother   . Diabetes Daughter   . Hypertension Daughter   . Cancer Brother     Social History   Social History  . Marital Status: Widowed    Spouse Name: N/A  . Number of Children: N/A  . Years of Education: N/A   Occupational History  . Not on file.   Social History Main Topics  . Smoking status: Never Smoker   . Smokeless tobacco: Never Used  . Alcohol Use: No  . Drug Use: No  . Sexual Activity: No   Other Topics Concern  . Not on file   Social History Narrative   Physically inactive. She says she hurts in her back and knees too much to do much walking.     Physical Exam: Blood pressure 121/78, pulse 133, temperature 97.9 F (36.6 C), temperature source Oral, resp. rate 24, SpO2 94 %.  General: NAD Neck: JVP 12-14 cm, no thyromegaly or thyroid nodule.  Lungs: Crackles at bases bilaterally CV: Nondisplaced PMI.  Heart Tachy, irregular S1/S2, no S3/S4, 2/6 SEM RUSB with clear S2.  1+ ankle edema.  No carotid  bruit.  Normal pedal pulses.  Abdomen: Soft, nontender, no hepatosplenomegaly, no distention.  Skin: Intact without lesions or rashes.  Neurologic: Alert and oriented x 3.  Psych: Normal affect. Extremities: No clubbing or cyanosis.  HEENT: Normal.   Labs:   Lab Results  Component Value Date   WBC 8.3 08/04/2015   HGB 10.6* 08/04/2015   HCT 34.8* 08/04/2015   MCV 82.3 08/04/2015   PLT 154 08/04/2015    Recent Labs Lab 08/04/15 1942  NA 142  K 4.1  CL 100*  CO2 30  BUN 29*  CREATININE 1.52*  CALCIUM 9.5  GLUCOSE 188*  TnI 0.03 BNP 3168   Radiology: - CXR: Mild  pulmonary edema.  EKG: atrial fibrillation with RVR, RBBB, LAFB  ASSESSMENT AND PLAN: 81 yo with history of HTN but also orthostatic hypotension, symptomatic bradycardia with Medtronic PPM, paroxysmal atrial fibrillation chronic systolic CHF, CKD stage III, and iron deficiency anemia presents to ER with worsening dyspnea and weight gain.  This is her 3rd admission this month.  1. Acute on chronic systolic CHF: EF 56-97% by echo.  Etiology uncertain: could be tachy-mediated cardiomyopathy, but symptoms seem to predate the onset of atrial fibrillation. Possible viral myocarditis.  Cannot rule out ischemic cardiomyopathy, but no definite anginal symptoms.  She is tachycardic, which likely worsens the CHF exacerbation.  She is volume overloaded on exam today with NYHA class IIIb-IV symptoms.  - Lasix 40 mg IV every 8 hrs - Rate control atrial fibrillation with IV amiodarone. - Will stop metoprolol tartrate and use Toprol XL 25 mg bid.  - Lisinopril 5 mg bid.  - Will need to follow BMET closely.  2. Atrial fibrillation with RVR: I do not think that she is going to tolerate remaining in atrial fibrillation well.  Would favor rhythm control strategy at this point.  - Continue Eliquis, she has not missed any doses.  2.5 mg bid is correct dosing.  - Toprol XL 25 mg bid. - Amiodarone infusion to start amiodarone load and for rate control.  After some diuresis and time for amiodarone loading, will plan DCCV (will not have to do TEE again as long as she does not miss Eliquis) => probably Wednesday.  3. CKD: Stage III.  Follow closely with diuresis.  4. Medtronic PPM for symptomatic bradycardia. 5. Recent UTI: On Cipro, will give 2 more days treatment (for total of 3) at 250 mg bid.  6. Type II diabetes: Continue home med + sliding scale insulin.  7. Dysphagia: Requests evaluation by [facility] while in the hospital.   Signed: [name] 08/04/2015, 11:38 PM

## 2015-08-04 NOTE — Patient Outreach (Signed)
Attempt made to contact pt as part of transition of care (discharged 10/23), phone kept ringing, unable to leave a message.  Will try again tomorrow.      Shayne Alken.   Ann-Marie Kluge RN CCM [facility] Care Management  [phone]

## 2015-08-05 ENCOUNTER — Encounter (HOSPITAL_COMMUNITY): Payer: Self-pay | Admitting: Internal Medicine

## 2015-08-05 ENCOUNTER — Other Ambulatory Visit: Payer: Self-pay | Admitting: *Deleted

## 2015-08-05 ENCOUNTER — Inpatient Hospital Stay (HOSPITAL_COMMUNITY): Payer: Medicare Other

## 2015-08-05 ENCOUNTER — Encounter: Payer: Self-pay | Admitting: *Deleted

## 2015-08-05 ENCOUNTER — Ambulatory Visit: Payer: Medicare Other | Admitting: *Deleted

## 2015-08-05 DIAGNOSIS — K224 Dyskinesia of esophagus: Secondary | ICD-10-CM

## 2015-08-05 DIAGNOSIS — D509 Iron deficiency anemia, unspecified: Secondary | ICD-10-CM | POA: Diagnosis present

## 2015-08-05 DIAGNOSIS — I481 Persistent atrial fibrillation: Secondary | ICD-10-CM

## 2015-08-05 DIAGNOSIS — R1314 Dysphagia, pharyngoesophageal phase: Secondary | ICD-10-CM

## 2015-08-05 HISTORY — DX: Dyskinesia of esophagus: K22.4

## 2015-08-05 LAB — TROPONIN I
TROPONIN I: 0.04 ng/mL — AB (ref <0.031)
Troponin I: 0.05 ng/mL — ABNORMAL HIGH (ref <0.031)
Troponin I: 0.04 ng/mL — ABNORMAL HIGH (ref <0.031)

## 2015-08-05 LAB — GLUCOSE, CAPILLARY
GLUCOSE-CAPILLARY: 154 mg/dL — AB (ref 65–99)
GLUCOSE-CAPILLARY: 201 mg/dL — AB (ref 65–99)
GLUCOSE-CAPILLARY: 270 mg/dL — AB (ref 65–99)
GLUCOSE-CAPILLARY: 67 mg/dL (ref 65–99)
Glucose-Capillary: 98 mg/dL (ref 65–99)
Glucose-Capillary: 138 mg/dL — ABNORMAL HIGH (ref 65–99)

## 2015-08-05 LAB — BASIC METABOLIC PANEL
ANION GAP: 9 (ref 5–15)
BUN: 29 mg/dL — ABNORMAL HIGH (ref 6–20)
CALCIUM: 8.9 mg/dL (ref 8.9–10.3)
CO2: 31 mmol/L (ref 22–32)
Chloride: 101 mmol/L (ref 101–111)
Creatinine, Ser: 1.58 mg/dL — ABNORMAL HIGH (ref 0.44–1.00)
GFR, EST AFRICAN AMERICAN: 34 mL/min — AB (ref >60)
GFR, EST NON AFRICAN AMERICAN: 30 mL/min — AB (ref >60)
Glucose, Bld: 216 mg/dL — ABNORMAL HIGH (ref 65–99)
Potassium: 4.3 mmol/L (ref 3.5–5.1)
SODIUM: 141 mmol/L (ref 135–145)

## 2015-08-05 LAB — MRSA PCR SCREENING: MRSA by PCR: NEGATIVE

## 2015-08-05 LAB — TSH: TSH: 4.34 u[IU]/mL (ref 0.350–4.500)

## 2015-08-05 MED ORDER — APIXABAN 2.5 MG PO TABS
2.5000 mg | ORAL_TABLET | Freq: Two times a day (BID) | ORAL | Status: DC
Start: 2015-08-05 — End: 2015-08-11
  Administered 2015-08-05 – 2015-08-11 (×14): 2.5 mg via ORAL
  Filled 2015-08-05 (×16): qty 1

## 2015-08-05 MED ORDER — ACETAMINOPHEN 325 MG PO TABS: 650.0000 mg | ORAL_TABLET | ORAL | Status: DC | PRN

## 2015-08-05 MED ORDER — SENNOSIDES-DOCUSATE SODIUM 8.6-50 MG PO TABS: 1.0000 | ORAL_TABLET | Freq: Every day | ORAL | Status: DC | PRN

## 2015-08-05 MED ORDER — INSULIN ASPART 100 UNIT/ML ~~LOC~~ SOLN
0.0000 [IU] | Freq: Every day | SUBCUTANEOUS | Status: DC
  Administered 2015-08-06: 3 [IU] via SUBCUTANEOUS
  Administered 2015-08-10: 2 [IU] via SUBCUTANEOUS

## 2015-08-05 MED ORDER — LISINOPRIL 5 MG PO TABS: 5.0000 mg | ORAL_TABLET | Freq: Two times a day (BID) | ORAL | Status: DC

## 2015-08-05 MED ORDER — PANTOPRAZOLE SODIUM 40 MG PO TBEC
40.0000 mg | DELAYED_RELEASE_TABLET | Freq: Every day | ORAL | Status: DC
  Administered 2015-08-05 – 2015-08-11 (×7): 40 mg via ORAL
  Filled 2015-08-05 (×7): qty 1

## 2015-08-05 MED ORDER — LISINOPRIL 2.5 MG PO TABS
2.5000 mg | ORAL_TABLET | Freq: Two times a day (BID) | ORAL | Status: DC
  Administered 2015-08-05 (×2): 2.5 mg via ORAL
  Filled 2015-08-05 (×2): qty 1

## 2015-08-05 MED ORDER — SODIUM CHLORIDE 0.9 % IV SOLN
250.0000 mL | INTRAVENOUS | Status: DC | PRN
  Administered 2015-08-06: 12:00:00 via INTRAVENOUS

## 2015-08-05 MED ORDER — TRIAMCINOLONE ACETONIDE 0.025 % EX CREA
1.0000 "application " | TOPICAL_CREAM | Freq: Four times a day (QID) | CUTANEOUS | Status: DC | PRN
  Filled 2015-08-05: qty 15

## 2015-08-05 MED ORDER — SODIUM CHLORIDE 0.9 % IJ SOLN
3.0000 mL | Freq: Two times a day (BID) | INTRAMUSCULAR | Status: DC
  Administered 2015-08-05 – 2015-08-07 (×2): 3 mL via INTRAVENOUS

## 2015-08-05 MED ORDER — MONTELUKAST SODIUM 10 MG PO TABS
10.0000 mg | ORAL_TABLET | Freq: Every day | ORAL | Status: DC
  Administered 2015-08-05 – 2015-08-10 (×7): 10 mg via ORAL
  Filled 2015-08-05 (×7): qty 1

## 2015-08-05 MED ORDER — CIPROFLOXACIN HCL 500 MG PO TABS
250.0000 mg | ORAL_TABLET | Freq: Two times a day (BID) | ORAL | Status: AC
  Administered 2015-08-05 – 2015-08-06 (×4): 250 mg via ORAL
  Filled 2015-08-05 (×4): qty 1

## 2015-08-05 MED ORDER — POLYSACCHARIDE IRON COMPLEX 150 MG PO CAPS
150.0000 mg | ORAL_CAPSULE | Freq: Two times a day (BID) | ORAL | Status: DC
  Administered 2015-08-05 – 2015-08-11 (×13): 150 mg via ORAL
  Filled 2015-08-05 (×13): qty 1

## 2015-08-05 MED ORDER — DULOXETINE HCL 20 MG PO CPEP
20.0000 mg | ORAL_CAPSULE | Freq: Every day | ORAL | Status: DC
  Administered 2015-08-05 – 2015-08-10 (×7): 20 mg via ORAL
  Filled 2015-08-05 (×10): qty 1

## 2015-08-05 MED ORDER — APIXABAN 2.5 MG PO TABS: 2.5000 mg | ORAL_TABLET | Freq: Two times a day (BID) | ORAL | Status: DC

## 2015-08-05 MED ORDER — INFLUENZA VAC SPLIT QUAD 0.5 ML IM SUSY: 0.5000 mL | PREFILLED_SYRINGE | Freq: Once | INTRAMUSCULAR | Status: DC

## 2015-08-05 MED ORDER — ALLOPURINOL 300 MG PO TABS
300.0000 mg | ORAL_TABLET | Freq: Every day | ORAL | Status: DC
  Administered 2015-08-05 – 2015-08-11 (×7): 300 mg via ORAL
  Filled 2015-08-05 (×7): qty 1

## 2015-08-05 MED ORDER — METOPROLOL SUCCINATE ER 25 MG PO TB24
25.0000 mg | ORAL_TABLET | Freq: Two times a day (BID) | ORAL | Status: DC
  Administered 2015-08-05 – 2015-08-06 (×3): 25 mg via ORAL
  Filled 2015-08-05 (×3): qty 1

## 2015-08-05 MED ORDER — POTASSIUM CHLORIDE CRYS ER 20 MEQ PO TBCR
20.0000 meq | EXTENDED_RELEASE_TABLET | Freq: Every day | ORAL | Status: DC
  Administered 2015-08-05 – 2015-08-11 (×6): 20 meq via ORAL
  Filled 2015-08-05 (×7): qty 1

## 2015-08-05 MED ORDER — TRAZODONE HCL 50 MG PO TABS
50.0000 mg | ORAL_TABLET | Freq: Every evening | ORAL | Status: DC | PRN
  Administered 2015-08-05 (×2): 50 mg via ORAL
  Administered 2015-08-07: 25 mg via ORAL
  Administered 2015-08-09 – 2015-08-10 (×2): 50 mg via ORAL
  Filled 2015-08-05 (×5): qty 1

## 2015-08-05 MED ORDER — INSULIN ASPART 100 UNIT/ML ~~LOC~~ SOLN
0.0000 [IU] | Freq: Three times a day (TID) | SUBCUTANEOUS | Status: DC
  Administered 2015-08-05: 8 [IU] via SUBCUTANEOUS
  Administered 2015-08-05: 5 [IU] via SUBCUTANEOUS
  Administered 2015-08-06 – 2015-08-07 (×2): 3 [IU] via SUBCUTANEOUS
  Administered 2015-08-07: 2 [IU] via SUBCUTANEOUS
  Administered 2015-08-07: 3 [IU] via SUBCUTANEOUS
  Administered 2015-08-08: 5 [IU] via SUBCUTANEOUS
  Administered 2015-08-09: 3 [IU] via SUBCUTANEOUS
  Administered 2015-08-09: 5 [IU] via SUBCUTANEOUS
  Administered 2015-08-10 – 2015-08-11 (×4): 3 [IU] via SUBCUTANEOUS
  Administered 2015-08-11: 5 [IU] via SUBCUTANEOUS

## 2015-08-05 MED ORDER — ALBUTEROL SULFATE (2.5 MG/3ML) 0.083% IN NEBU: 3.0000 mL | INHALATION_SOLUTION | Freq: Four times a day (QID) | RESPIRATORY_TRACT | Status: DC | PRN

## 2015-08-05 MED ORDER — ONDANSETRON HCL 4 MG/2ML IJ SOLN: 4.0000 mg | Freq: Four times a day (QID) | INTRAMUSCULAR | Status: DC | PRN

## 2015-08-05 MED ORDER — SODIUM CHLORIDE 0.9 % IJ SOLN: 3.0000 mL | INTRAMUSCULAR | Status: DC | PRN

## 2015-08-05 MED ORDER — FUROSEMIDE 10 MG/ML IJ SOLN
40.0000 mg | Freq: Three times a day (TID) | INTRAMUSCULAR | Status: DC
  Administered 2015-08-05 (×3): 40 mg via INTRAVENOUS
  Filled 2015-08-05 (×3): qty 4

## 2015-08-05 MED ORDER — LINAGLIPTIN 5 MG PO TABS
5.0000 mg | ORAL_TABLET | Freq: Every day | ORAL | Status: DC
  Administered 2015-08-05 – 2015-08-11 (×7): 5 mg via ORAL
  Filled 2015-08-05 (×7): qty 1

## 2015-08-05 MED ORDER — ATORVASTATIN CALCIUM 20 MG PO TABS
20.0000 mg | ORAL_TABLET | Freq: Every day | ORAL | Status: DC
  Administered 2015-08-05 – 2015-08-10 (×6): 20 mg via ORAL
  Filled 2015-08-05 (×6): qty 1

## 2015-08-05 NOTE — Progress Notes (Signed)
UR Completed Shatonia Hoots Graves-Bigelow, RN,BSN [phone]  

## 2015-08-05 NOTE — Consult Note (Signed)
Referring Provider: Dr. Shirlee Latch Primary Care Physician:  Kimber Relic, MD Primary Gastroenterologist:  unassigned  Reason for Consultation:  dysphagia    HPI: Danielle Benitez is a 81 y.o. female who was admitted to the hospital yesterday with complaints of shortness of breath. She has a history of hypertension, symptomatic bradycardia with Medtronic pacemaker, orthostatic hypotension, paroxysmal atrial fibrillation, iron deficiency anemia, CK D stage III, and systolic CHF. In September she began to develop shortness of breath with exertion. She was evaluated by cardiology and noted to be in A. fib with RVR. Echo showed EF 35-40%. She was admitted from October 17 through October 17. During that admission she was noted to be anemic with a hemoglobin of 8.7. Iron was 18 and saturation was 4%. Ferritin was 9. She was guaiac negative. During that admission she had a TEE guided TCC the 2 - her. She went home and NSR but was readmitted on the 19th with recurrent A. fib and CHF. She was diuresed and discharged on the 23rd. Once she got home she again began to feel short of breath and returned to the emergency room on the 24th. Other past medical history is significant for aortic stenosis, hyperlipidemia, type 2 diabetes, and gout.  She reports that she's been feeling progressively more fatigued over the past 5-6 weeks. She has attributed this to her cardiac issues. She denies any change in her bowel habits or stool caliber and denies bright red blood per rectum or melena. She reports that she had a colonoscopy years ago by Dr. Chilton Si which was normal. She also reports that she is been having difficulty swallowing solids and liquids for months. She has had several episodes where she has had to spit food up because it won't go down. She does not feel as if food or liquids portal in her throat and she says she does not cough or sputter when eating. She feels as if food gets stuck in the mid chest. She also has some  intermittent epigastric pain that is not affected by food. It is sometimes associated with nausea but she has not vomited. She occasionally uses ibuprofen but not on a regular basis. She denies a prior history of ulcers but states she has had heartburn for many years. She was on pantoprazole prior to this admission but states she was getting heartburn several days per week despite using pantoprazole.   Past Medical History  Diagnosis Date  . Gout   . Osteoarthritis   . Bursitis   . S/P cardiac pacemaker procedure, PPM Medtronic REVO placed 01/11/2012 01/11/2012    a. for symptomatic bradycardia. Followed by Dr. Ladona Ridgel.  . Personal history of fall   . Chronic kidney disease, stage II (mild)   . Memory loss   . Anxiety state, unspecified   . Other and unspecified hyperlipidemia   . Anemia, unspecified   . Abnormality of gait   . Depressive disorder, not elsewhere classified   . Rheumatism, unspecified and fibrositis   . Lumbago   . Nonspecific abnormal results of liver function study   . Obesity, unspecified   . Internal hemorrhoids without mention of complication   . Viremia, unspecified   . Osteoarthrosis, unspecified whether generalized or localized, unspecified site   . Abnormality of gait   . Viremia, unspecified   . Pain in joint, shoulder region 11/22/2013    leeft   . Pruritus 11/22/2013  . Eczema 11/22/2013  . Full dentures   . HOH (hard  of hearing)   . Wears glasses   . Female stress incontinence 05/06/2015  . DM (diabetes mellitus), type 2, uncontrolled, with renal complications (HCC) 01/08/2012  . CKD (chronic kidney disease), stage III   . Aortic stenosis     a. Mild by echo 08/2013.  . Essential hypertension   . Orthostatic hypotension   . Bifascicular block   . Chronic systolic CHF (congestive heart failure) (HCC)     a. Dx 07/2015 - EF 35-40%, unclear etiology  . Mild CAD     a. Cath 2013: 20-30% prox RCA.  . Presence of permanent cardiac pacemaker   .  Anticoagulated- Eliquis 07/31/2015  . Atrial fibrillation - developed 07/11/15 by PPM interrogation, recurrent after DCCV 10/14 07/11/2015     recurrent after DCCV 10/14    Past Surgical History  Procedure Laterality Date  . Extremity cyst excision    . Cardiac catheterization  12/03/2003    Dr Clarene Duke  . Colonoscopy      ?????  . Orif wrist fracture Right 04/09/2014    Procedure: OPEN REDUCTION INTERNAL FIXATION (ORIF) RIGHT DISPLACED  DISTAL RADIUS  FRACTURE;  Surgeon: Dominica Severin, MD;  Location: [facility];  Service: Orthopedics;  Laterality: Right;  . Left heart catheterization with coronary angiogram N/A 01/10/2012    Procedure: LEFT HEART CATHETERIZATION WITH CORONARY ANGIOGRAM;  Surgeon: Chrystie Nose, MD;  Location: [facility] CATH LAB;  Service: Cardiovascular;  Laterality: N/A;  . Permanent pacemaker insertion Left 01/11/2012    Procedure: PERMANENT PACEMAKER INSERTION;  Surgeon: Thurmon Fair, MD;  Location: [facility] CATH LAB;  Service: Cardiovascular;  Laterality: Left;  . Tee without cardioversion N/A 07/25/2015    Procedure: TRANSESOPHAGEAL ECHOCARDIOGRAM (TEE);  Surgeon: Pricilla Riffle, MD;  Location: [facility] ENDOSCOPY;  Service: Cardiovascular;  Laterality: N/A;  . Cardioversion N/A 07/25/2015    Procedure: CARDIOVERSION;  Surgeon: Pricilla Riffle, MD;  Location: [facility] ENDOSCOPY;  Service: Cardiovascular;  Laterality: N/A;  . Cataract extraction w/ intraocular lens  implant, bilateral Bilateral 03/2015-04/2015    Prior to Admission medications   Medication Sig Start Date End Date Taking? Authorizing Provider  albuterol (PROVENTIL HFA;VENTOLIN HFA) 108 (90 BASE) MCG/ACT inhaler Inhale 2 puffs into the lungs every 6 (six) hours as needed for wheezing or shortness of breath. 06/19/15  Yes Sharon Seller, NP  allopurinol (ZYLOPRIM) 300 MG tablet Take 1 tablet (300 mg total) by mouth daily. 06/20/15  Yes Kimber Relic, MD  apixaban (ELIQUIS) 2.5 MG TABS tablet Take 1 tablet (2.5 mg total) by  mouth 2 (two) times daily. 07/28/15  Yes Rhonda G Barrett, PA-C  atorvastatin (LIPITOR) 40 MG tablet Take 0.5 tablets (20 mg total) by mouth daily at 6 PM. 06/20/15 06/25/16 Yes Kimber Relic, MD  ciprofloxacin (CIPRO) 500 MG tablet Take 1 tablet (500 mg total) by mouth 2 (two) times daily. 08/03/15  Yes Luke K Kilroy, PA-C  DULoxetine (CYMBALTA) 20 MG capsule Take 1 capsule (20 mg total) by mouth at bedtime. Take 1 tablet by mouth daily to help treat anxiety and nerves. 08/03/15  Yes Luke K Kilroy, PA-C  furosemide (LASIX) 40 MG tablet Take 1 tablet (40 mg total) by mouth daily. 08/03/15  Yes Luke K Kilroy, PA-C  Influenza vac split quadrivalent PF (FLUARIX) 0.5 ML injection Inject 0.5 mLs into the muscle once.   Yes Historical Provider, MD  iron polysaccharides (NU-IRON) 150 MG capsule Take 1 capsule (150 mg total) by mouth 2 (two) times daily. 07/28/15  Yes Rhonda G Barrett, PA-C  lisinopril (PRINIVIL,ZESTRIL) 20 MG tablet Take 0.5 tablets (10 mg total) by mouth at bedtime. Take 1/2 tablet by mouth once daily for blood pressure 08/03/15  Yes Luke K Kilroy, PA-C  metoprolol tartrate (LOPRESSOR) 25 MG tablet Take 1 tablet (25 mg total) by mouth 2 (two) times daily. 07/17/15  Yes Dayna N Salberg, PA-C  montelukast (SINGULAIR) 10 MG tablet Take 1 tablet (10 mg total) by mouth at bedtime. 06/19/15  Yes Sharon Seller, NP  pantoprazole (PROTONIX) 40 MG tablet Take 1 tablet (40 mg total) by mouth daily. 06/19/15  Yes Sharon Seller, NP  saxagliptin HCl (ONGLYZA) 2.5 MG TABS tablet Take 1 tablet (2.5 mg total) by mouth daily. Take one tablet by mouth once daily for blood sugar 07/28/15  Yes Rhonda G Barrett, PA-C  senna-docusate (SENOKOT-S) 8.6-50 MG tablet Take 1 tablet by mouth daily as needed for mild constipation. 07/28/15  Yes Rhonda G Barrett, PA-C  triamcinolone (KENALOG) 0.025 % cream Apply 1 application topically 4 (four) times daily as needed (itching).   Yes Historical Provider, MD  acetaminophen  (TYLENOL) 325 MG tablet Take 2 tablets (650 mg total) by mouth every 4 (four) hours as needed for headache or mild pain. 08/03/15   Abelino Derrick, PA-C    Current Facility-Administered Medications  Medication Dose Route Frequency Provider Last Rate Last Dose  . 0.9 %  sodium chloride infusion  250 mL Intravenous PRN Laurey Morale, MD      . acetaminophen (TYLENOL) tablet 650 mg  650 mg Oral Q4H PRN Laurey Morale, MD      . albuterol (PROVENTIL) (2.5 MG/3ML) 0.083% nebulizer solution 3 mL  3 mL Inhalation Q6H PRN Laurey Morale, MD      . allopurinol (ZYLOPRIM) tablet 300 mg  300 mg Oral Daily Laurey Morale, MD   300 mg at 08/05/15 5681  . amiodarone (NEXTERONE PREMIX) 360 MG/200ML (1.8 mg/mL) IV infusion  30 mg/hr Intravenous Continuous Laurey Morale, MD 16.7 mL/hr at 08/05/15 1319 30 mg/hr at 08/05/15 1319  . apixaban (ELIQUIS) tablet 2.5 mg  2.5 mg Oral BID Juliette Mangle, RPH   2.5 mg at 08/05/15 2751  . atorvastatin (LIPITOR) tablet 20 mg  20 mg Oral q1800 Laurey Morale, MD      . ciprofloxacin (CIPRO) tablet 250 mg  250 mg Oral BID Laurey Morale, MD   250 mg at 08/05/15 0804  . DULoxetine (CYMBALTA) DR capsule 20 mg  20 mg Oral QHS Laurey Morale, MD   20 mg at 08/05/15 0100  . furosemide (LASIX) injection 40 mg  40 mg Intravenous 3 times per day Laurey Morale, MD   40 mg at 08/05/15 1348  . insulin aspart (novoLOG) injection 0-15 Units  0-15 Units Subcutaneous TID WC Laurey Morale, MD   8 Units at 08/05/15 1335  . insulin aspart (novoLOG) injection 0-5 Units  0-5 Units Subcutaneous QHS Laurey Morale, MD   0 Units at 08/05/15 0045  . iron polysaccharides (NIFEREX) capsule 150 mg  150 mg Oral BID Laurey Morale, MD   150 mg at 08/05/15 7001  . linagliptin (TRADJENTA) tablet 5 mg  5 mg Oral Daily Laurey Morale, MD   5 mg at 08/05/15 7494  . lisinopril (PRINIVIL,ZESTRIL) tablet 2.5 mg  2.5 mg Oral BID Laurey Morale, MD   2.5 mg at 08/05/15 0804  . metoprolol succinate  (TOPROL-XL)  24 hr tablet 25 mg  25 mg Oral BID Laurey Morale, MD   25 mg at 08/05/15 0804  . montelukast (SINGULAIR) tablet 10 mg  10 mg Oral QHS Laurey Morale, MD   10 mg at 08/05/15 0100  . ondansetron (ZOFRAN) injection 4 mg  4 mg Intravenous Q6H PRN Laurey Morale, MD      . pantoprazole (PROTONIX) EC tablet 40 mg  40 mg Oral Daily Laurey Morale, MD   40 mg at 08/05/15 1610  . potassium chloride SA (K-DUR,KLOR-CON) CR tablet 20 mEq  20 mEq Oral Daily Laurey Morale, MD   20 mEq at 08/05/15 0929  . senna-docusate (Senokot-S) tablet 1 tablet  1 tablet Oral Daily PRN Laurey Morale, MD      . sodium chloride 0.9 % injection 3 mL  3 mL Intravenous Q12H Laurey Morale, MD   3 mL at 08/05/15 0045  . sodium chloride 0.9 % injection 3 mL  3 mL Intravenous PRN Laurey Morale, MD      . traZODone (DESYREL) tablet 50 mg  50 mg Oral QHS PRN Laurey Morale, MD   50 mg at 08/05/15 0100  . triamcinolone (KENALOG) 0.025 % cream 1 application  1 application Topical QID PRN Laurey Morale, MD        Allergies as of 08/04/2015 - Review Complete 08/02/2015  Allergen Reaction Noted  . Beta adrenergic blockers Other (See Comments) 02/14/2008  . Jardiance [empagliflozin] Other (See Comments) 01/23/2015  . Fluzone [flu virus vaccine] Other (See Comments) 01/08/2012  . Tetanus toxoids Other (See Comments) 05/11/1992    Family History  Problem Relation Age of Onset  . Breast cancer Mother   . Cancer Mother   . Diabetes Daughter   . Hypertension Daughter   . Cancer Brother     Social History   Social History  . Marital Status: Widowed    Spouse Name: N/A  . Number of Children: N/A  . Years of Education: N/A   Occupational History  . Not on file.   Social History Main Topics  . Smoking status: Never Smoker   . Smokeless tobacco: Never Used  . Alcohol Use: No  . Drug Use: No  . Sexual Activity: No   Other Topics Concern  . Not on file   Social History Narrative   Physically  inactive. She says she hurts in her back and knees too much to do much walking.    Review of Systems: Gen: Denies any fever, chills, sweats, anorexia, weight loss, and sleep disorder. Admits to weakness and fatigue CV: Admits to swelling of legs and palpitations Resp: Admits to dyspnea on exertion GI: Denies vomiting blood, jaundice, and fecal incontinence. Admits to dysphagia to solids and liquids of several months duration GU : Admits to dysuria and has been on Cipro for UTI. MS: Denies joint pain, limitation of movement, and swelling, stiffness, low back pain, extremity pain. Denies muscle weakness, cramps, atrophy.  Derm: Denies rash, itching, dry skin, hives, moles, warts, or unhealing ulcers.  Psych: Denies depression, anxiety, memory loss, suicidal ideation, hallucinations, paranoia, and confusion. Heme: Denies bruising,  and enlarged lymph nodes. Neuro:  Denies any headaches, dizziness, paresthesias.   Physical Exam: Vital signs in last 24 hours: Temp:  [97.7 F (36.5 C)-98.1 F (36.7 C)] 98 F (36.7 C) (10/25 1134) Pulse Rate:  [54-133] 95 (10/25 1134) Resp:  [16-25] 18 (10/25 1134) BP: (96-127)/(58-96) 119/83 mmHg (10/25 1134)  SpO2:  [91 %-100 %] 100 % (10/25 1134) Weight:  [166 lb 7.2 oz (75.5 kg)-166 lb 8 oz (75.524 kg)] 166 lb 7.2 oz (75.5 kg) (10/25 0500) Last BM Date: 08/05/15 General:   Alert,  Well-developed, well-nourished, pleasant and cooperative in NAD Head:  Normocephalic and atraumatic. Eyes:  Sclera clear, no icterus. Conjunctiva pink. Ears:  Normal auditory acuity. Nose:  No deformity, discharge,  or lesions. Mouth:  No deformity or lesions.   Neck:  Supple; no masses or thyromegaly. Lungs:  Crackles at bases  Heart:  Irregularly irregular and rapid Abdomen:  Soft,nontender, BS active,nonpalp mass or hsm.   Rectal:  Deferred  Msk:  Symmetrical without gross deformities. . Pulses:  Normal pulses noted. Extremities: 1+ edema from knees down Neurologic   Alert and  oriented x4;  grossly normal neurologically. Skin:  Intact without significant lesions or rashes.. Psych: Alert and cooperative. Normal mood and affect.  Intake/Output from previous day: 10/24 0701 - 10/25 0700 In: 578.7 [I.V.:578.7] Out: -  Intake/Output this shift: Total I/O In: 480 [P.O.:480] Out: 450 [Urine:450]  Lab Results:  Recent Labs  08/04/15 1942  WBC 8.3  HGB 10.6*  HCT 34.8*  PLT 154   BMET  Recent Labs  08/03/15 0300 08/04/15 1942 08/05/15 0745  NA 138 142 141  K 4.3 4.1 4.3  CL 100* 100* 101  CO2 GLUCOSE 164* 188* 216*  BUN 28* 29* 29*  CREATININE 1.37* 1.52* 1.58*  CALCIUM 9.1 9.5 8.9      Studies/Results: Dg Chest 2 View  08/04/2015  CLINICAL DATA:  Patient with history of atrial fibrillation. Chest pain and heaviness. EXAM: CHEST  2 VIEW COMPARISON:  Chest radiograph 07/30/2015 FINDINGS: Multi lead pacer apparatus overlies the left hemi thorax, leads are stable in position. Stable cardiomegaly. Pulmonary vascular redistribution with mild interstitial pulmonary opacities. No large area of pulmonary consolidation. Mid thoracic spine degenerative changes. No pleural effusion pneumothorax. IMPRESSION: Cardiomegaly, pulmonary vascular redistribution and mild interstitial edema. Electronically Signed   By: Annia Belt M.D.   On: 08/04/2015 19:37   Dg Esophagus  08/05/2015  CLINICAL DATA:  Difficulty swallowing solids and liquids for months. EXAM: ESOPHOGRAM/BARIUM SWALLOW TECHNIQUE: Single contrast examination was performed using thin barium or water soluble. FLUOROSCOPY TIME:  Radiation Exposure Index (as provided by the fluoroscopic device): 7.8 mGy entrance does If the device does not provide the exposure index: Fluoroscopy Time:  1.2 minutes Number of Acquired Images:  None, only saved fluoroscopy COMPARISON:  None. FINDINGS: Single contrast single projection study performed due to patient's medical condition. Oblique pharyngeal  imaging showed no aspiration or obstructive process. The thoracic esophagus has normal distensibility and course with no evidence of mass or stricture. A small hiatal hernia is present. Esophageal motility could be adequately assessed due to positioning, but primary propulsion wave was effective and there was no significant stasis. No encountered gastroesophageal reflux. The 13 mm barium tablet successfully traversed the esophagus. IMPRESSION: No explanation for dysphagia. Electronically Signed   By: Marnee Spring M.D.   On: 08/05/2015 13:49    IMPRESSION/PLAN: 81 year old female with a history of A. fib, on this, CHF, CAD, diabetes, admitted with dyspnea. Patient currently being diuresed. Patient reports dysphagia for several months duration that has been increasing in frequency and severity. When coupled with iron deficiency anemia, will likely need EGD later this week to evaluate for esophagitis, gastritis, ulcer, duodenitis, etc. Will need to discontinue Eliquis 2 days prior to procedure. For  now, will order esophogram with barium tablet. EGD timing to be reviewed with attending.    Stan Head PA-C 08/05/2015,  Pager [phone]  Mon-Fri 8a-5p [phone] after 5p, weekends, holidays  [facility] Attending  I have also seen and assessed the patient and agree with the advanced practitioner's assessment and plan.  Ba swallow shows what I think is some dilation and dysmotility. I do not think a stricture is ruled out due to technical issues with the study. An EGD +/- dilation will make sense. Can do on Eliquis for diagnosis but will not dilate unless off Eliquis - suspect she needs to stay on that right now given afib issues and cardioversion(s)  She also has an unexplained iron def anemia without evidence for bleeding but chronic intermittent occult blood loss not ruled out. Colonoscopy could be appropriate at some point but co-morbidities preclude at present.   We will see her again tomorrow -  I have explained this to the patient and plan to speak to her daughter who will contact me.  I appreciate the opportunity to care for her. Iva Boop, MD, [facility] [phone] (pager) 08/05/2015 3:45 PM

## 2015-08-05 NOTE — Progress Notes (Signed)
[facility]  Patient Status: Active (receiving services up to time of hospitalization)  [facility] is providing the following services: RN and PT  If patient discharges after hours, please call [phone].   Danielle Benitez 08/05/2015, 9:57 AM

## 2015-08-05 NOTE — Progress Notes (Signed)
Patient ID: Danielle Benitez, female   DOB: 1932-09-04, 81 y.o.   MRN: [id number]   SUBJECTIVE:  Stable this morning. HR 90s-110s on amiodarone gtt, remains in atrial fibrillation.  Had a good night sleeping, attributes it to oxygen.   Scheduled Meds: . allopurinol  300 mg Oral Daily  . apixaban  2.5 mg Oral BID  . atorvastatin  20 mg Oral q1800  . ciprofloxacin  250 mg Oral BID  . DULoxetine  20 mg Oral QHS  . furosemide  40 mg Intravenous 3 times per day  . insulin aspart  0-15 Units Subcutaneous TID WC  . insulin aspart  0-5 Units Subcutaneous QHS  . iron polysaccharides  150 mg Oral BID  . linagliptin  5 mg Oral Daily  . lisinopril  2.5 mg Oral BID  . metoprolol succinate  25 mg Oral BID  . montelukast  10 mg Oral QHS  . pantoprazole  40 mg Oral Daily  . potassium chloride  20 mEq Oral Daily  . sodium chloride  3 mL Intravenous Q12H   Continuous Infusions: . amiodarone 30 mg/hr (08/05/15 0516)   PRN Meds:.sodium chloride, acetaminophen, albuterol, ondansetron (ZOFRAN) IV, senna-docusate, sodium chloride, traZODone, triamcinolone    Filed Vitals:   08/05/15 0050 08/05/15 0500 08/05/15 0518 08/05/15 0623  BP: 110/89 100/58 96/63 104/67  Pulse: 82 82 104 100  Temp: 97.7 F (36.5 C) 98 F (36.7 C)    TempSrc: Oral Oral    Resp: 20 18    Height:  (1.575 m)     Weight: 166 lb 8 oz (75.524 kg) 166 lb 7.2 oz (75.5 kg)    SpO2: 99% 100%      Intake/Output Summary (Last 24 hours) at 08/05/15 0731 Last data filed at 08/05/15 0655  Gross per 24 hour  Intake 578.67 ml  Output      0 ml  Net 578.67 ml    LABS: Basic Metabolic Panel:  Recent Labs  40/98/11 0300 08/04/15 1942  NA 138 142  K 4.3 4.1  CL 100* 100*  CO2 28 30  GLUCOSE 164* 188*  BUN 28* 29*  CREATININE 1.37* 1.52*  CALCIUM 9.1 9.5   Liver Function Tests: No results for input(s): AST, ALT, ALKPHOS, BILITOT, PROT, ALBUMIN in the last 72 hours. No results for input(s): LIPASE, AMYLASE in the last 72  hours. CBC:  Recent Labs  08/04/15 1942  WBC 8.3  HGB 10.6*  HCT 34.8*  MCV 82.3  PLT 154   Cardiac Enzymes:  Recent Labs  08/05/15 0059  TROPONINI 0.05*   BNP: Invalid input(s): POCBNP D-Dimer: No results for input(s): DDIMER in the last 72 hours. Hemoglobin A1C: No results for input(s): HGBA1C in the last 72 hours. Fasting Lipid Panel: No results for input(s): CHOL, HDL, LDLCALC, TRIG, CHOLHDL, LDLDIRECT in the last 72 hours. Thyroid Function Tests:  Recent Labs  08/05/15 0059  TSH 4.340   Anemia Panel: No results for input(s): VITAMINB12, FOLATE, FERRITIN, TIBC, IRON, RETICCTPCT in the last 72 hours.  RADIOLOGY: Dg Chest 2 View  08/04/2015  CLINICAL DATA:  Patient with history of atrial fibrillation. Chest pain and heaviness. EXAM: CHEST  2 VIEW COMPARISON:  Chest radiograph 07/30/2015 FINDINGS: Multi lead pacer apparatus overlies the left hemi thorax, leads are stable in position. Stable cardiomegaly. Pulmonary vascular redistribution with mild interstitial pulmonary opacities. No large area of pulmonary consolidation. Mid thoracic spine degenerative changes. No pleural effusion pneumothorax. IMPRESSION: Cardiomegaly, pulmonary vascular redistribution and mild  interstitial edema. Electronically Signed   By: Annia Belt M.D.   On: 08/04/2015 19:37   Dg Chest 2 View  07/30/2015  CLINICAL DATA:  Shortness of breath EXAM: CHEST  2 VIEW COMPARISON:  07/28/2015 chest radiograph FINDINGS: Do lead left subclavian pacemaker is stable in configuration with lead tips overlying the right atrium and right ventricle. Stable cardiomediastinal silhouette with mild-to-moderate cardiomegaly. Stable trace bilateral pleural effusions. No pneumothorax. Mild pulmonary edema, which is worsened. No focal lung consolidation. IMPRESSION: 1. Mild congestive heart failure, worsened. 2. Stable trace bilateral pleural effusions. Electronically Signed   By: Delbert Phenix M.D.   On: 07/30/2015 11:53     Dg Chest 2 View  07/28/2015  CLINICAL DATA:  Patient admitted 1 week ago with shortness of breath. EXAM: CHEST  2 VIEW COMPARISON:  07/18/2015 FINDINGS: There is mild bilateral interstitial thickening. There is no focal parenchymal opacity. There are bilateral small pleural effusions. There is no pneumothorax. There is stable cardiomegaly. There is a dual lead cardiac pacemaker. There is severe osteoarthritis of the left glenohumeral joint. IMPRESSION: Stable cardiomegaly with mild pulmonary vascular congestion. Electronically Signed   By: Elige Ko   On: 07/28/2015 12:09   Dg Chest 2 View  07/18/2015  CLINICAL DATA:  Worsening shortness of breath EXAM: CHEST  2 VIEW COMPARISON:  07/11/2015 FINDINGS: There is mild bilateral interstitial thickening. There is no focal parenchymal opacity. There is no pleural effusion or pneumothorax. There is stable cardiomegaly. There is a dual lead cardiac pacemaker. There is severe osteoarthritis of the left glenohumeral joint. IMPRESSION: 1. Stable cardiomegaly with mild pulmonary vascular congestion. Electronically Signed   By: Elige Ko   On: 07/18/2015 11:08   Dg Chest 2 View  07/11/2015  CLINICAL DATA:  COPD. Bronchitis. Cough. Wheezing. Shortness of breath. EXAM: CHEST  2 VIEW COMPARISON:  01/11/2012 chest radiograph . FINDINGS: Stable configuration of dual lead left subclavian pacemaker with lead tips overlying the right atrium and right ventricle. Moderate enlargement of the cardiac silhouette, which appears increased in the interval. Otherwise stable mediastinal contour with mild tortuosity of the thoracic aorta. No pneumothorax. No pleural effusion. No focal lung consolidation. There is cephalization of the pulmonary vasculature without overt pulmonary edema. Moderate degenerative changes in the visualized thoracolumbar spine. IMPRESSION: 1. Worsening moderate enlargement of the cardiac silhouette, cannot exclude pericardial effusion. 2. Cephalization of  the pulmonary vasculature without overt pulmonary edema. Electronically Signed   By: Delbert Phenix M.D.   On: 07/11/2015 16:53   US Renal  07/21/2015  CLINICAL DATA:  Acute renal failure EXAM: RENAL / URINARY TRACT ULTRASOUND COMPLETE COMPARISON:  None. FINDINGS: Right Kidney: Length: 10.3 cm. Echogenicity within normal limits. No mass or hydronephrosis visualized. Left Kidney: Length: 10.6 cm. Mildly increased echotexture. No mass or hydronephrosis. Bladder: Appears normal for degree of bladder distention. IMPRESSION: No hydronephrosis or acute findings. Mildly increased echotexture on the left. Electronically Signed   By: Charlett Nose M.D.   On: 07/21/2015 11:11    PHYSICAL EXAM General: NAD Neck: JVP 12-14 cm, no thyromegaly or thyroid nodule.  Lungs: Crackles at bases bilaterally CV: Nondisplaced PMI.  Heart irregular S1/S2, no S3/S4, 1/6 SEM RUSB.  1+ ankle edema.    Abdomen: Soft, nontender, no hepatosplenomegaly, no distention.  Neurologic: Alert and oriented x 3.  Psych: Normal affect. Extremities: No clubbing or cyanosis.   TELEMETRY: Reviewed telemetry pt in atrial fibrillation, rate 90s-110s  ASSESSMENT AND PLAN: 81 yo with history of HTN but  also orthostatic hypotension, symptomatic bradycardia with Medtronic PPM, paroxysmal atrial fibrillation chronic systolic CHF, CKD stage III, and iron deficiency anemia presents to ER with worsening dyspnea and weight gain. This is her 3rd admission this month.  1. Acute on chronic systolic CHF: EF 96-04% by echo. Etiology uncertain: could be tachy-mediated cardiomyopathy, but symptoms seem to predate the onset of atrial fibrillation. Possible viral myocarditis. Cannot rule out ischemic cardiomyopathy, but no definite anginal symptoms. She is tachycardic, which likely worsens the CHF exacerbation. She remains volume overloaded on exam.  - Continue Lasix 40 mg IV every 8 hrs, follow strict I/Os.  - Rate control atrial fibrillation with IV  amiodarone. - Continue Toprol XL 25 mg bid.  - BP somewhat soft, drop lisinopril dose to 2.5 mg bid.  - Will need to follow BMET closely.  2. Atrial fibrillation with RVR: I do not think that she is going to tolerate remaining in atrial fibrillation well. Would favor rhythm control strategy at this point.  - Continue Eliquis, she has not missed any doses. 2.5 mg bid is correct dosing.  - Toprol XL 25 mg bid. - Amiodarone infusion to start amiodarone load and for rate control. After some diuresis and time for amiodarone loading, will plan DCCV (will not have to do TEE again as long as she does not miss Eliquis) => probably Wednesday.  3. CKD: Stage III. Follow closely with diuresis.  4. Medtronic PPM for symptomatic bradycardia. 5. Recent UTI: On Cipro, will give 2 more days treatment (for total of 3) at 250 mg bid.  6. Type II diabetes: Continue home med + sliding scale insulin.  7. Dysphagia: Requests evaluation by [facility] while in the hospital.   [name] 08/05/2015 7:35 AM

## 2015-08-05 NOTE — Care Management Note (Addendum)
Case Management Note  Patient Details  Name: JOSSETTE RISSO MRN: [id number] Date of Birth: [birth year]  Subjective/Objective:  Readmission to the Hospital- for CHF and PAF. IV Amio for rate control and IV Lasix for diuresis. Pt is currently active with [facility] for RN/PT. Pt will need resumption orders for Home Health Services.               Action/Plan: CM will continue to monitor for disposition needs.   Expected Discharge Date:                  Expected Discharge Plan:  Home w Home Health Services  In-House Referral:     Discharge planning Services  CM Consult  Post Acute Care Choice:  Home Health, Resumption of Svcs/PTA Provider Choice offered to:  Patient  DME Arranged:  N/A DME Agency:  NA  HH Arranged:  RN, PT HH Agency:  [facility]  Status of Service:  Completed, signed off  Medicare Important Message Given:    Date Medicare IM Given:    Medicare IM give by:    Date Additional Medicare IM Given:    Additional Medicare Important Message give by:     If discussed at Long Length of Stay Meetings, dates discussed:    Additional Comments: 1009 08-11-15 Tomi Bamberger, RN,BSN [phone] Pt is planned for d/c today. [facility] aware of disposition today. No further needs from CM at this time.  Gala Lewandowsky, RN 08/05/2015, 12:25 PM

## 2015-08-05 NOTE — Patient Outreach (Signed)
Attempt (second) made to contact pt for transition of care (discharged 10/23),confirm home visit scheduled for today- made prior to recent hospitalization.   HIPPA compliant voice message left with contact number.    Plan to go ahead with home visit today.     Shayne Alken.   Pierzchala RN CCM [facility] Care Management  [phone]

## 2015-08-05 NOTE — Patient Outreach (Signed)
[facility]) Care Management  08/05/2015  Danielle Benitez 1932/08/23 [id number]   Documentation note:  View in Epic shows pt  went to ED 10/24 for worsening dyspnea and weight gain, admitted.  Home visit scheduled for today cancelled.   Plan to f/u with pt at discharge.      Shayne Alken.   Danielle Mowrer RN CCM [facility] Care Management  [phone]

## 2015-08-06 ENCOUNTER — Inpatient Hospital Stay (HOSPITAL_COMMUNITY): Payer: Medicare Other | Admitting: Anesthesiology

## 2015-08-06 ENCOUNTER — Encounter (HOSPITAL_COMMUNITY)
Admission: EM | Disposition: A | Discharge status: Home / Self Care | Payer: Self-pay | Source: Home / Self Care | Attending: Cardiology

## 2015-08-06 ENCOUNTER — Encounter (HOSPITAL_COMMUNITY): Payer: Self-pay

## 2015-08-06 DIAGNOSIS — I4891 Unspecified atrial fibrillation: Secondary | ICD-10-CM

## 2015-08-06 HISTORY — PX: CARDIOVERSION: SHX1299

## 2015-08-06 LAB — BASIC METABOLIC PANEL
ANION GAP: 16 — AB (ref 5–15)
BUN: 30 mg/dL — ABNORMAL HIGH (ref 6–20)
CALCIUM: 9 mg/dL (ref 8.9–10.3)
CO2: 27 mmol/L (ref 22–32)
Chloride: 95 mmol/L — ABNORMAL LOW (ref 101–111)
Creatinine, Ser: 1.84 mg/dL — ABNORMAL HIGH (ref 0.44–1.00)
GFR, EST AFRICAN AMERICAN: 29 mL/min — AB (ref >60)
GFR, EST NON AFRICAN AMERICAN: 25 mL/min — AB (ref >60)
GLUCOSE: 134 mg/dL — AB (ref 65–99)
POTASSIUM: 4 mmol/L (ref 3.5–5.1)
Sodium: 138 mmol/L (ref 135–145)

## 2015-08-06 LAB — GLUCOSE, CAPILLARY
GLUCOSE-CAPILLARY: 153 mg/dL — AB (ref 65–99)
GLUCOSE-CAPILLARY: 230 mg/dL — AB (ref 65–99)
Glucose-Capillary: 121 mg/dL — ABNORMAL HIGH (ref 65–99)
Glucose-Capillary: 253 mg/dL — ABNORMAL HIGH (ref 65–99)

## 2015-08-06 LAB — CBC
HEMATOCRIT: 33.7 % — AB (ref 36.0–46.0)
HEMOGLOBIN: 9.9 g/dL — AB (ref 12.0–15.0)
MCH: 25.1 pg — ABNORMAL LOW (ref 26.0–34.0)
MCHC: 29.4 g/dL — ABNORMAL LOW (ref 30.0–36.0)
MCV: 85.5 fL (ref 78.0–100.0)
Platelets: 110 10*3/uL — ABNORMAL LOW (ref 150–400)
RBC: 3.94 MIL/uL (ref 3.87–5.11)
RDW: 23.3 % — ABNORMAL HIGH (ref 11.5–15.5)
WBC: 5.3 10*3/uL (ref 4.0–10.5)

## 2015-08-06 SURGERY — CARDIOVERSION
Anesthesia: Monitor Anesthesia Care

## 2015-08-06 MED ORDER — SODIUM CHLORIDE 0.9 % IJ SOLN
3.0000 mL | Freq: Two times a day (BID) | INTRAMUSCULAR | Status: DC
  Administered 2015-08-07 – 2015-08-10 (×5): 3 mL via INTRAVENOUS

## 2015-08-06 MED ORDER — PHENYLEPHRINE HCL 10 MG/ML IJ SOLN
INTRAMUSCULAR | Status: DC | PRN
  Administered 2015-08-06 (×2): 40 mg via INTRAVENOUS
  Administered 2015-08-06 (×2): 80 mg via INTRAVENOUS
  Administered 2015-08-06: 40 mg via INTRAVENOUS
  Administered 2015-08-06: 80 mg via INTRAVENOUS

## 2015-08-06 MED ORDER — SODIUM CHLORIDE 0.9 % IV SOLN
INTRAVENOUS | Status: DC
  Administered 2015-08-08: 06:00:00 via INTRAVENOUS

## 2015-08-06 MED ORDER — LIDOCAINE HCL (CARDIAC) 20 MG/ML IV SOLN
INTRAVENOUS | Status: DC | PRN
  Administered 2015-08-06: 60 mg via INTRAVENOUS

## 2015-08-06 MED ORDER — METOPROLOL SUCCINATE ER 25 MG PO TB24
25.0000 mg | ORAL_TABLET | Freq: Every day | ORAL | Status: DC
  Administered 2015-08-07 – 2015-08-11 (×5): 25 mg via ORAL
  Filled 2015-08-06 (×5): qty 1

## 2015-08-06 MED ORDER — SODIUM CHLORIDE 0.9 % IV SOLN
250.0000 mL | INTRAVENOUS | Status: DC
  Administered 2015-08-08: 10:00:00 via INTRAVENOUS

## 2015-08-06 MED ORDER — FUROSEMIDE 10 MG/ML IJ SOLN
40.0000 mg | Freq: Three times a day (TID) | INTRAMUSCULAR | Status: DC
  Administered 2015-08-06 – 2015-08-07 (×4): 40 mg via INTRAVENOUS
  Filled 2015-08-06 (×4): qty 4

## 2015-08-06 MED ORDER — SODIUM CHLORIDE 0.9 % IV SOLN
Freq: Once | INTRAVENOUS | Status: AC
  Administered 2015-08-06: 05:00:00 via INTRAVENOUS

## 2015-08-06 MED ORDER — SODIUM CHLORIDE 0.9 % IJ SOLN
3.0000 mL | INTRAMUSCULAR | Status: DC | PRN
Start: 2015-08-06 — End: 2015-08-11

## 2015-08-06 MED ORDER — PROPOFOL 10 MG/ML IV BOLUS
INTRAVENOUS | Status: DC | PRN
  Administered 2015-08-06: 50 mg via INTRAVENOUS

## 2015-08-06 MED ORDER — PROMETHAZINE HCL 25 MG/ML IJ SOLN: 6.2500 mg | INTRAMUSCULAR | Status: DC | PRN

## 2015-08-06 NOTE — Telephone Encounter (Signed)
TCM - LMTCB

## 2015-08-06 NOTE — Progress Notes (Addendum)
[facility] Progress Note  Subjective:   Feels well today. No SOB. Daughter in room, reports dysphagia has been going on for months. Barium swallow with some dilation and dysmotility.   Objective:  Vital signs in last 24 hours: Temp:  [97.8 F (36.6 C)-98.5 F (36.9 C)] 98 F (36.7 C) (10/26 0400) Pulse Rate:  [72-120] 113 (10/26 0400) Resp:  [18-19] 19 (10/26 0636) BP: (86-119)/(42-83) 95/64 mmHg (10/26 0822) SpO2:  [98 %-100 %] 99 % (10/26 0400) Weight:  [164 lb 10.9 oz (74.7 kg)] 164 lb 10.9 oz (74.7 kg) (10/26 0400) Last BM Date: 08/05/15 General:   Alert,  Well-developed,    in NAD Heart:irreg irreg Pulm;crackles at bases Abdomen:  Soft, nontender and nondistended. Normal bowel sounds, without guarding, and without rebound.   Extremities:  Without edema. Neurologic Alert and  oriented x4;  grossly normal neurologically. Psych: Alert and cooperative. Normal mood and affect.   Lab Results:  Recent Labs  08/04/15 1942 08/06/15 0335  WBC 8.3 5.3  HGB 10.6* 9.9*  HCT 34.8* 33.7*  PLT 154 110*   BMET  Recent Labs  08/04/15 1942 08/05/15 0745 08/06/15 0335  NA 142 141 138  K 4.1 4.3 4.0  CL 100* 101 95*  CO2 30 31 27   GLUCOSE 188* 216* 134*  BUN 29* 29* 30*  CREATININE 1.52* 1.58* 1.84*  CALCIUM 9.5 8.9 9.0    Dg Chest 2 View  08/04/2015  CLINICAL DATA:  Patient with history of atrial fibrillation. Chest pain and heaviness. EXAM: CHEST  2 VIEW COMPARISON:  Chest radiograph 07/30/2015 FINDINGS: Multi lead pacer apparatus overlies the left hemi thorax, leads are stable in position. Stable cardiomegaly. Pulmonary vascular redistribution with mild interstitial pulmonary opacities. No large area of pulmonary consolidation. Mid thoracic spine degenerative changes. No pleural effusion pneumothorax. IMPRESSION: Cardiomegaly, pulmonary vascular redistribution and mild interstitial edema. Electronically Signed   By: Annia Belt M.D.   On: 08/04/2015  19:37   Dg Esophagus  08/05/2015  CLINICAL DATA:  Difficulty swallowing solids and liquids for months. EXAM: ESOPHOGRAM/BARIUM SWALLOW TECHNIQUE: Single contrast examination was performed using thin barium or water soluble. FLUOROSCOPY TIME:  Radiation Exposure Index (as provided by the fluoroscopic device): 7.8 mGy entrance does If the device does not provide the exposure index: Fluoroscopy Time:  1.2 minutes Number of Acquired Images:  None, only saved fluoroscopy COMPARISON:  None. FINDINGS: Single contrast single projection study performed due to patient's medical condition. Oblique pharyngeal imaging showed no aspiration or obstructive process. The thoracic esophagus has normal distensibility and course with no evidence of mass or stricture. A small hiatal hernia is present. Esophageal motility could be adequately assessed due to positioning, but primary propulsion wave was effective and there was no significant stasis. No encountered gastroesophageal reflux. The 13 mm barium tablet successfully traversed the esophagus. IMPRESSION: No explanation for dysphagia. Electronically Signed   By: Marnee Spring M.D.   On: 08/05/2015 13:49    ASSESSMENT/PLAN:  Dysphagia--Will need EGD Will review with attending as to timing. Pt would like to proceed while in hospital.  Afib--for cardioversion later today. On eliquis.     LOS: 2 days   Hvozdovic, Tollie Pizza PA-C 08/06/2015, Pager [phone] Mon-Fri 8a-5p [phone] after 5p, weekends, holidays  [facility] Attending  I have also seen and assessed the patient and agree with the advanced practitioner's assessment and plan.   Did well with cardioversion and feels better. In NSR Will do diagnostic EGD on Eliquis  tomorrow. The risks and benefits as well as alternatives of endoscopic procedure(s) have been discussed and reviewed. All questions answered. The patient agrees to proceed.  Iva Boop, MD, [facility] [phone]  (pager) 08/06/2015 6:25 PM  Changed EGD to Friday as daughter cannot be here tomorrow and really wants to be present so "mom is not alone" for the EGD.  Iva Boop, MD, [facility] [phone] (pager) 08/06/2015 6:37 PM

## 2015-08-06 NOTE — Anesthesia Preprocedure Evaluation (Signed)
Anesthesia Evaluation  Patient identified by MRN, date of birth, ID band Patient awake    Reviewed: Allergy & Precautions, NPO status , Patient's Chart, lab work & pertinent test results  Airway Mallampati: II  TM Distance: >3 FB Neck ROM: Full    Dental no notable dental hx.    Pulmonary neg pulmonary ROS,    Pulmonary exam normal breath sounds clear to auscultation       Cardiovascular hypertension, +CHF  + dysrhythmias + pacemaker  Rhythm:Irregular Rate:Normal + Systolic murmurs Cardiomyopathy - EF 35-40%   Neuro/Psych negative neurological ROS  negative psych ROS   GI/Hepatic negative GI ROS, Neg liver ROS,   Endo/Other  diabetes  Renal/GU negative Renal ROS  negative genitourinary   Musculoskeletal negative musculoskeletal ROS (+)   Abdominal   Peds negative pediatric ROS (+)  Hematology negative hematology ROS (+)   Anesthesia Other Findings   Reproductive/Obstetrics negative OB ROS                             Anesthesia Physical Anesthesia Plan  ASA: III  Anesthesia Plan: MAC   Post-op Pain Management:    Induction: Intravenous  Airway Management Planned: Mask  Additional Equipment:   Intra-op Plan:   Post-operative Plan:   Informed Consent: I have reviewed the patients History and Physical, chart, labs and discussed the procedure including the risks, benefits and alternatives for the proposed anesthesia with the patient or authorized representative who has indicated his/her understanding and acceptance.   Dental advisory given  Plan Discussed with: CRNA and Surgeon  Anesthesia Plan Comments:         Anesthesia Quick Evaluation

## 2015-08-06 NOTE — Progress Notes (Signed)
Inpatient Diabetes Program Recommendations  AACE/ADA: New Consensus Statement on Inpatient Glycemic Control (2015)  Target Ranges:  Prepandial:   less than 140 mg/dL      Peak postprandial:   less than 180 mg/dL (1-2 hours)      Critically ill patients:  140 - 180 mg/dL   Review of Glycemic Control Results for BELOVED, COBIA (MRN [id number]) as of 08/06/2015 12:16  Ref. Range 08/05/2015 11:32 08/05/2015 16:29 08/05/2015 17:13 08/05/2015 21:13 08/06/2015 07:54  Glucose-Capillary Latest Ref Range: 65-99 mg/dL 500 (H) 8 units correction  67 98 138 (H) 153 (H)   Diabetes history: DM 2 Outpatient Diabetes medications: Onglyza 2.5 Current orders for Inpatient glycemic control: Tradjenta 5 and moderate correction tidwc and HS scale.  Inpatient Diabetes Program Recommendations:    Though cbg's spike into low 200's, cbg's drop significantly using the moderate correction scale (potentially due to chronic kidney disease, stage 3) Please consider a decrease back to sensitive correction tidwc and HS scale. With advanced age and chronic kidney disease, glucose control might be less stringently controlled.  Thank you Lenor Coffin, RN, MSN, CDE  Diabetes Inpatient Program Office: [phone] Pager: [phone] 8:00 am to 5:00 pm

## 2015-08-06 NOTE — Anesthesia Postprocedure Evaluation (Signed)
  Anesthesia Post-op Note  Patient: Danielle Benitez  Procedure(s) Performed: Procedure(s) (LRB): CARDIOVERSION (N/A)  Patient Location: PACU  Anesthesia Type: MAC  Level of Consciousness: awake and alert   Airway and Oxygen Therapy: Patient Spontanous Breathing  Post-op Pain: mild  Post-op Assessment: Post-op Vital signs reviewed, Patient's Cardiovascular Status Stable, Respiratory Function Stable, Patent Airway and No signs of Nausea or vomiting  Last Vitals:  Filed Vitals:   08/06/15 1135  BP: 107/62  Pulse: 98  Temp: 36.6 C  Resp: 22    Post-op Vital Signs: stable   Complications: No apparent anesthesia complications

## 2015-08-06 NOTE — Telephone Encounter (Signed)
Pt has been in ER since triage call. Pt still has appt on file. Have been unable to get in touch w/ patient or daughter since initial call. Will close encounter.

## 2015-08-06 NOTE — H&P (View-Only) (Signed)
Patient ID: Danielle Benitez, female   DOB: [birth year], 81 y.o.   MRN: [id number]   SUBJECTIVE:  SBP in 80s overnight, better now. Says her breathing feels a lot better. No dizziness, lightheadedness, or near syncope. No chest pain.  Down 2 lbs on IV lasix 40 mg q8hrs.  Creatinine 1.8 today.   Scheduled Meds: . allopurinol  300 mg Oral Daily  . apixaban  2.5 mg Oral BID  . atorvastatin  20 mg Oral q1800  . ciprofloxacin  250 mg Oral BID  . DULoxetine  20 mg Oral QHS  . furosemide  40 mg Intravenous 3 times per day  . insulin aspart  0-15 Units Subcutaneous TID WC  . insulin aspart  0-5 Units Subcutaneous QHS  . iron polysaccharides  150 mg Oral BID  . linagliptin  5 mg Oral Daily  . lisinopril  2.5 mg Oral BID  . metoprolol succinate  25 mg Oral BID  . montelukast  10 mg Oral QHS  . pantoprazole  40 mg Oral Daily  . potassium chloride  20 mEq Oral Daily  . sodium chloride  3 mL Intravenous Q12H   Continuous Infusions: . amiodarone 30 mg/hr (08/06/15 0123)   PRN Meds:.sodium chloride, acetaminophen, albuterol, ondansetron (ZOFRAN) IV, senna-docusate, sodium chloride, traZODone, triamcinolone    Filed Vitals:   08/06/15 0000 08/06/15 0400 08/06/15 0422 08/06/15 0636  BP: 101/62 87/53 86/42  108/72  Pulse: 72 113    Temp: 98.4 F (36.9 C) 98 F (36.7 C)    TempSrc: Oral Oral    Resp: Height:      Weight:  164 lb 10.9 oz (74.7 kg)    SpO2: 100% 99%      Intake/Output Summary (Last 24 hours) at 08/06/15 0752 Last data filed at 08/05/15 2225  Gross per 24 hour  Intake    958 ml  Output   1025 ml  Net    -67 ml    LABS: Basic Metabolic Panel:  Recent Labs  40/98/11 0745 08/06/15 0335  NA 141 138  K 4.3 4.0  CL 101 95*  CO2 31 27  GLUCOSE 216* 134*  BUN 29* 30*  CREATININE 1.58* 1.84*  CALCIUM 8.9 9.0   Liver Function Tests: No results for input(s): AST, ALT, ALKPHOS, BILITOT, PROT, ALBUMIN in the last 72 hours. No results for input(s): LIPASE,  AMYLASE in the last 72 hours. CBC:  Recent Labs  08/04/15 1942 08/06/15 0335  WBC 8.3 5.3  HGB 10.6* 9.9*  HCT 34.8* 33.7*  MCV 82.3 85.5  PLT 154 110*   Cardiac Enzymes:  Recent Labs  08/05/15 0059 08/05/15 0745 08/05/15 1400  TROPONINI 0.05* 0.04* 0.04*   BNP: Invalid input(s): POCBNP D-Dimer: No results for input(s): DDIMER in the last 72 hours. Hemoglobin A1C: No results for input(s): HGBA1C in the last 72 hours. Fasting Lipid Panel: No results for input(s): CHOL, HDL, LDLCALC, TRIG, CHOLHDL, LDLDIRECT in the last 72 hours. Thyroid Function Tests:  Recent Labs  08/05/15 0059  TSH 4.340   Anemia Panel: No results for input(s): VITAMINB12, FOLATE, FERRITIN, TIBC, IRON, RETICCTPCT in the last 72 hours.  RADIOLOGY: Dg Chest 2 View  08/04/2015  CLINICAL DATA:  Patient with history of atrial fibrillation. Chest pain and heaviness. EXAM: CHEST  2 VIEW COMPARISON:  Chest radiograph 07/30/2015 FINDINGS: Multi lead pacer apparatus overlies the left hemi thorax, leads are stable in position. Stable cardiomegaly. Pulmonary vascular redistribution with mild interstitial pulmonary opacities.  No large area of pulmonary consolidation. Mid thoracic spine degenerative changes. No pleural effusion pneumothorax. IMPRESSION: Cardiomegaly, pulmonary vascular redistribution and mild interstitial edema. Electronically Signed   By: Annia Belt M.D.   On: 08/04/2015 19:37   Dg Chest 2 View  07/30/2015  CLINICAL DATA:  Shortness of breath EXAM: CHEST  2 VIEW COMPARISON:  07/28/2015 chest radiograph FINDINGS: Do lead left subclavian pacemaker is stable in configuration with lead tips overlying the right atrium and right ventricle. Stable cardiomediastinal silhouette with mild-to-moderate cardiomegaly. Stable trace bilateral pleural effusions. No pneumothorax. Mild pulmonary edema, which is worsened. No focal lung consolidation. IMPRESSION: 1. Mild congestive heart failure, worsened. 2. Stable  trace bilateral pleural effusions. Electronically Signed   By: Delbert Phenix M.D.   On: 07/30/2015 11:53   Dg Chest 2 View  07/28/2015  CLINICAL DATA:  Patient admitted 1 week ago with shortness of breath. EXAM: CHEST  2 VIEW COMPARISON:  07/18/2015 FINDINGS: There is mild bilateral interstitial thickening. There is no focal parenchymal opacity. There are bilateral small pleural effusions. There is no pneumothorax. There is stable cardiomegaly. There is a dual lead cardiac pacemaker. There is severe osteoarthritis of the left glenohumeral joint. IMPRESSION: Stable cardiomegaly with mild pulmonary vascular congestion. Electronically Signed   By: Elige Ko   On: 07/28/2015 12:09   Dg Chest 2 View  07/18/2015  CLINICAL DATA:  Worsening shortness of breath EXAM: CHEST  2 VIEW COMPARISON:  07/11/2015 FINDINGS: There is mild bilateral interstitial thickening. There is no focal parenchymal opacity. There is no pleural effusion or pneumothorax. There is stable cardiomegaly. There is a dual lead cardiac pacemaker. There is severe osteoarthritis of the left glenohumeral joint. IMPRESSION: 1. Stable cardiomegaly with mild pulmonary vascular congestion. Electronically Signed   By: Elige Ko   On: 07/18/2015 11:08   Dg Chest 2 View  07/11/2015  CLINICAL DATA:  COPD. Bronchitis. Cough. Wheezing. Shortness of breath. EXAM: CHEST  2 VIEW COMPARISON:  01/11/2012 chest radiograph . FINDINGS: Stable configuration of dual lead left subclavian pacemaker with lead tips overlying the right atrium and right ventricle. Moderate enlargement of the cardiac silhouette, which appears increased in the interval. Otherwise stable mediastinal contour with mild tortuosity of the thoracic aorta. No pneumothorax. No pleural effusion. No focal lung consolidation. There is cephalization of the pulmonary vasculature without overt pulmonary edema. Moderate degenerative changes in the visualized thoracolumbar spine. IMPRESSION: 1. Worsening  moderate enlargement of the cardiac silhouette, cannot exclude pericardial effusion. 2. Cephalization of the pulmonary vasculature without overt pulmonary edema. Electronically Signed   By: Delbert Phenix M.D.   On: 07/11/2015 16:53   Dg Esophagus  08/05/2015  CLINICAL DATA:  Difficulty swallowing solids and liquids for months. EXAM: ESOPHOGRAM/BARIUM SWALLOW TECHNIQUE: Single contrast examination was performed using thin barium or water soluble. FLUOROSCOPY TIME:  Radiation Exposure Index (as provided by the fluoroscopic device): 7.8 mGy entrance does If the device does not provide the exposure index: Fluoroscopy Time:  1.2 minutes Number of Acquired Images:  None, only saved fluoroscopy COMPARISON:  None. FINDINGS: Single contrast single projection study performed due to patient's medical condition. Oblique pharyngeal imaging showed no aspiration or obstructive process. The thoracic esophagus has normal distensibility and course with no evidence of mass or stricture. A small hiatal hernia is present. Esophageal motility could be adequately assessed due to positioning, but primary propulsion wave was effective and there was no significant stasis. No encountered gastroesophageal reflux. The 13 mm barium tablet successfully traversed the  esophagus. IMPRESSION: No explanation for dysphagia. Electronically Signed   By: Marnee Spring M.D.   On: 08/05/2015 13:49   US Renal  07/21/2015  CLINICAL DATA:  Acute renal failure EXAM: RENAL / URINARY TRACT ULTRASOUND COMPLETE COMPARISON:  None. FINDINGS: Right Kidney: Length: 10.3 cm. Echogenicity within normal limits. No mass or hydronephrosis visualized. Left Kidney: Length: 10.6 cm. Mildly increased echotexture. No mass or hydronephrosis. Bladder: Appears normal for degree of bladder distention. IMPRESSION: No hydronephrosis or acute findings. Mildly increased echotexture on the left. Electronically Signed   By: Charlett Nose M.D.   On: 07/21/2015 11:11    PHYSICAL  EXAM General: NAD Neck: JVP 10-12 cm, no thyromegaly or thyroid nodule.  Lungs: Bilateral basilar crackles CV: Nondisplaced PMI.  Heart irregular S1/S2, no S3/S4, 1/6 SEM RUSB.  1+ ankle edema L>R.  Abdomen: Soft, NT, ND, no HSM  Neurologic: Alert and oriented x 3.  Psych: Pleasant affect.  Extremities: No clubbing or cyanosis.   TELEMETRY: Reviewed telemetry pt in atrial fibrillation, rate 90s currently  ASSESSMENT AND PLAN: 81 yo with history of HTN but also orthostatic hypotension, symptomatic bradycardia with Medtronic PPM, paroxysmal atrial fibrillation chronic systolic CHF, CKD stage III, and iron deficiency anemia presents to ER with worsening dyspnea and weight gain. This is her 3rd admission this month.  1. Acute on chronic systolic CHF: EF 16-10% by echo. Etiology uncertain: could be tachy-mediated cardiomyopathy, but symptoms seem to predate the onset of atrial fibrillation. Possible viral myocarditis. Cannot rule out ischemic cardiomyopathy, but no definite anginal symptoms. She is tachycardic, which likely worsens the CHF exacerbation. She remains volume overloaded on exam.  - Will continue lasix 40 mg IV q8hrs for now. - Rate control atrial fibrillation with IV amiodarone. - Decrease Toprol XL to 25 once daily. - Hold lisinopril with continued low BPs and Cr bump. - Follow BMET closely.  2. Atrial fibrillation: Tolerates poorly with CHF. Favor rhythm control at this point. - Continue Eliquis, she has not missed any doses. 2.5 mg bid is correct dosing.  - Toprol XL 25 mg daily. - DCCV today (will not have to do TEE again as she has not missed Eliquis). 3. AKI on CKD Stage III: Creatinine 1.58 => 1.8.  Med adjustments as above. - Follow closely   4. Medtronic PPM for symptomatic bradycardia. 5. Recent UTI: On Cipro, will give 1 more day treatment (for total of 3) at 250 mg bid.  6. Type II diabetes: Continue home med + sliding scale insulin.  7. Dysphagia: Barium  swallow showed nothing definite.  GI following, eventual EGD.   Danielle Freer, PA-C 08/06/2015 7:52 AM  Advanced Heart Failure Team Pager [phone] (M-F; 7a - 4p)  Please contact [facility] for night-coverage after hours (4p -7a ) and weekends on [email or web address]  Patient seen with PA, agree with the above note.  Still volume overloaded, continue IV Lasix.  Creatinine up slightly, will stop lisinopril and cut back on Toprol XL with marginal BP.  Continue amiodarone infusion, plan for DCCV today.  Think we need rhythm control if possible.   Danielle Benitez 08/06/2015 9:00 AM

## 2015-08-06 NOTE — Procedures (Signed)
Electrical Cardioversion Procedure Note Danielle Benitez [id number] [birth year]  Procedure: Electrical Cardioversion Indications:  Atrial Fibrillation  Procedure Details Consent: Risks of procedure as well as the alternatives and risks of each were explained to the (patient/caregiver).  Consent for procedure obtained. Time Out: Verified patient identification, verified procedure, site/side was marked, verified correct patient position, special equipment/implants available, medications/allergies/relevent history reviewed, required imaging and test results available.  Performed  Patient placed on cardiac monitor, pulse oximetry, supplemental oxygen as necessary.  Sedation given: Propofol Pacer pads placed anterior and posterior chest.  Cardioverted 1 time(s).  Cardioverted at 200J.  Evaluation Findings: Post procedure EKG shows: NSR Complications: None Patient did tolerate procedure well.  Medtronic PPM was checked post-procedure and functioning normally.    Danielle Benitez 08/06/2015, 12:57 PM

## 2015-08-06 NOTE — Progress Notes (Signed)
Patient ID: Danielle Benitez, female   DOB: 02/16/1932, 81 y.o.   MRN: [id number]   SUBJECTIVE:  SBP in 80s overnight, better now. Says her breathing feels a lot better. No dizziness, lightheadedness, or near syncope. No chest pain.  Down 2 lbs on IV lasix 40 mg q8hrs.  Creatinine 1.8 today.   Scheduled Meds: . allopurinol  300 mg Oral Daily  . apixaban  2.5 mg Oral BID  . atorvastatin  20 mg Oral q1800  . ciprofloxacin  250 mg Oral BID  . DULoxetine  20 mg Oral QHS  . furosemide  40 mg Intravenous 3 times per day  . insulin aspart  0-15 Units Subcutaneous TID WC  . insulin aspart  0-5 Units Subcutaneous QHS  . iron polysaccharides  150 mg Oral BID  . linagliptin  5 mg Oral Daily  . lisinopril  2.5 mg Oral BID  . metoprolol succinate  25 mg Oral BID  . montelukast  10 mg Oral QHS  . pantoprazole  40 mg Oral Daily  . potassium chloride  20 mEq Oral Daily  . sodium chloride  3 mL Intravenous Q12H   Continuous Infusions: . amiodarone 30 mg/hr (08/06/15 0123)   PRN Meds:.sodium chloride, acetaminophen, albuterol, ondansetron (ZOFRAN) IV, senna-docusate, sodium chloride, traZODone, triamcinolone    Filed Vitals:   08/06/15 0000 08/06/15 0400 08/06/15 0422 08/06/15 0636  BP: 101/62 87/53 86/42 108/72  Pulse: 72 113    Temp: 98.4 F (36.9 C) 98 F (36.7 C)    TempSrc: Oral Oral    Resp: 18 18  19  Height:      Weight:  164 lb 10.9 oz (74.7 kg)    SpO2: 100% 99%      Intake/Output Summary (Last 24 hours) at 08/06/15 0752 Last data filed at 08/05/15 2225  Gross per 24 hour  Intake    958 ml  Output   1025 ml  Net    -67 ml    LABS: Basic Metabolic Panel:  Recent Labs  08/05/15 0745 08/06/15 0335  NA 141 138  K 4.3 4.0  CL 101 95*  CO2 31 27  GLUCOSE 216* 134*  BUN 29* 30*  CREATININE 1.58* 1.84*  CALCIUM 8.9 9.0   Liver Function Tests: No results for input(s): AST, ALT, ALKPHOS, BILITOT, PROT, ALBUMIN in the last 72 hours. No results for input(s): LIPASE,  AMYLASE in the last 72 hours. CBC:  Recent Labs  08/04/15 1942 08/06/15 0335  WBC 8.3 5.3  HGB 10.6* 9.9*  HCT 34.8* 33.7*  MCV 82.3 85.5  PLT 154 110*   Cardiac Enzymes:  Recent Labs  08/05/15 0059 08/05/15 0745 08/05/15 1400  TROPONINI 0.05* 0.04* 0.04*   BNP: Invalid input(s): POCBNP D-Dimer: No results for input(s): DDIMER in the last 72 hours. Hemoglobin A1C: No results for input(s): HGBA1C in the last 72 hours. Fasting Lipid Panel: No results for input(s): CHOL, HDL, LDLCALC, TRIG, CHOLHDL, LDLDIRECT in the last 72 hours. Thyroid Function Tests:  Recent Labs  08/05/15 0059  TSH 4.340   Anemia Panel: No results for input(s): VITAMINB12, FOLATE, FERRITIN, TIBC, IRON, RETICCTPCT in the last 72 hours.  RADIOLOGY: Dg Chest 2 View  08/04/2015  CLINICAL DATA:  Patient with history of atrial fibrillation. Chest pain and heaviness. EXAM: CHEST  2 VIEW COMPARISON:  Chest radiograph 07/30/2015 FINDINGS: Multi lead pacer apparatus overlies the left hemi thorax, leads are stable in position. Stable cardiomegaly. Pulmonary vascular redistribution with mild interstitial pulmonary opacities.   No large area of pulmonary consolidation. Mid thoracic spine degenerative changes. No pleural effusion pneumothorax. IMPRESSION: Cardiomegaly, pulmonary vascular redistribution and mild interstitial edema. Electronically Signed   By: Annia Belt M.D.   On: 08/04/2015 19:37   Dg Chest 2 View  07/30/2015  CLINICAL DATA:  Shortness of breath EXAM: CHEST  2 VIEW COMPARISON:  07/28/2015 chest radiograph FINDINGS: Do lead left subclavian pacemaker is stable in configuration with lead tips overlying the right atrium and right ventricle. Stable cardiomediastinal silhouette with mild-to-moderate cardiomegaly. Stable trace bilateral pleural effusions. No pneumothorax. Mild pulmonary edema, which is worsened. No focal lung consolidation. IMPRESSION: 1. Mild congestive heart failure, worsened. 2. Stable  trace bilateral pleural effusions. Electronically Signed   By: Delbert Phenix M.D.   On: 07/30/2015 11:53   Dg Chest 2 View  07/28/2015  CLINICAL DATA:  Patient admitted 1 week ago with shortness of breath. EXAM: CHEST  2 VIEW COMPARISON:  07/18/2015 FINDINGS: There is mild bilateral interstitial thickening. There is no focal parenchymal opacity. There are bilateral small pleural effusions. There is no pneumothorax. There is stable cardiomegaly. There is a dual lead cardiac pacemaker. There is severe osteoarthritis of the left glenohumeral joint. IMPRESSION: Stable cardiomegaly with mild pulmonary vascular congestion. Electronically Signed   By: Elige Ko   On: 07/28/2015 12:09   Dg Chest 2 View  07/18/2015  CLINICAL DATA:  Worsening shortness of breath EXAM: CHEST  2 VIEW COMPARISON:  07/11/2015 FINDINGS: There is mild bilateral interstitial thickening. There is no focal parenchymal opacity. There is no pleural effusion or pneumothorax. There is stable cardiomegaly. There is a dual lead cardiac pacemaker. There is severe osteoarthritis of the left glenohumeral joint. IMPRESSION: 1. Stable cardiomegaly with mild pulmonary vascular congestion. Electronically Signed   By: Elige Ko   On: 07/18/2015 11:08   Dg Chest 2 View  07/11/2015  CLINICAL DATA:  COPD. Bronchitis. Cough. Wheezing. Shortness of breath. EXAM: CHEST  2 VIEW COMPARISON:  01/11/2012 chest radiograph . FINDINGS: Stable configuration of dual lead left subclavian pacemaker with lead tips overlying the right atrium and right ventricle. Moderate enlargement of the cardiac silhouette, which appears increased in the interval. Otherwise stable mediastinal contour with mild tortuosity of the thoracic aorta. No pneumothorax. No pleural effusion. No focal lung consolidation. There is cephalization of the pulmonary vasculature without overt pulmonary edema. Moderate degenerative changes in the visualized thoracolumbar spine. IMPRESSION: 1. Worsening  moderate enlargement of the cardiac silhouette, cannot exclude pericardial effusion. 2. Cephalization of the pulmonary vasculature without overt pulmonary edema. Electronically Signed   By: Delbert Phenix M.D.   On: 07/11/2015 16:53   Dg Esophagus  08/05/2015  CLINICAL DATA:  Difficulty swallowing solids and liquids for months. EXAM: ESOPHOGRAM/BARIUM SWALLOW TECHNIQUE: Single contrast examination was performed using thin barium or water soluble. FLUOROSCOPY TIME:  Radiation Exposure Index (as provided by the fluoroscopic device): 7.8 mGy entrance does If the device does not provide the exposure index: Fluoroscopy Time:  1.2 minutes Number of Acquired Images:  None, only saved fluoroscopy COMPARISON:  None. FINDINGS: Single contrast single projection study performed due to patient's medical condition. Oblique pharyngeal imaging showed no aspiration or obstructive process. The thoracic esophagus has normal distensibility and course with no evidence of mass or stricture. A small hiatal hernia is present. Esophageal motility could be adequately assessed due to positioning, but primary propulsion wave was effective and there was no significant stasis. No encountered gastroesophageal reflux. The 13 mm barium tablet successfully traversed the  esophagus. IMPRESSION: No explanation for dysphagia. Electronically Signed   By: Marnee Spring M.D.   On: 08/05/2015 13:49   US Renal  07/21/2015  CLINICAL DATA:  Acute renal failure EXAM: RENAL / URINARY TRACT ULTRASOUND COMPLETE COMPARISON:  None. FINDINGS: Right Kidney: Length: 10.3 cm. Echogenicity within normal limits. No mass or hydronephrosis visualized. Left Kidney: Length: 10.6 cm. Mildly increased echotexture. No mass or hydronephrosis. Bladder: Appears normal for degree of bladder distention. IMPRESSION: No hydronephrosis or acute findings. Mildly increased echotexture on the left. Electronically Signed   By: Charlett Nose M.D.   On: 07/21/2015 11:11    PHYSICAL  EXAM General: NAD Neck: JVP 10-12 cm, no thyromegaly or thyroid nodule.  Lungs: Bilateral basilar crackles CV: Nondisplaced PMI.  Heart irregular S1/S2, no S3/S4, 1/6 SEM RUSB.  1+ ankle edema L>R.  Abdomen: Soft, NT, ND, no HSM  Neurologic: Alert and oriented x 3.  Psych: Pleasant affect.  Extremities: No clubbing or cyanosis.   TELEMETRY: Reviewed telemetry pt in atrial fibrillation, rate 90s currently  ASSESSMENT AND PLAN: 81 yo with history of HTN but also orthostatic hypotension, symptomatic bradycardia with Medtronic PPM, paroxysmal atrial fibrillation chronic systolic CHF, CKD stage III, and iron deficiency anemia presents to ER with worsening dyspnea and weight gain. This is her 3rd admission this month.  1. Acute on chronic systolic CHF: EF 16-10% by echo. Etiology uncertain: could be tachy-mediated cardiomyopathy, but symptoms seem to predate the onset of atrial fibrillation. Possible viral myocarditis. Cannot rule out ischemic cardiomyopathy, but no definite anginal symptoms. She is tachycardic, which likely worsens the CHF exacerbation. She remains volume overloaded on exam.  - Will continue lasix 40 mg IV q8hrs for now. - Rate control atrial fibrillation with IV amiodarone. - Decrease Toprol XL to 25 once daily. - Hold lisinopril with continued low BPs and Cr bump. - Follow BMET closely.  2. Atrial fibrillation: Tolerates poorly with CHF. Favor rhythm control at this point. - Continue Eliquis, she has not missed any doses. 2.5 mg bid is correct dosing.  - Toprol XL 25 mg daily. - DCCV today (will not have to do TEE again as she has not missed Eliquis). 3. AKI on CKD Stage III: Creatinine 1.58 => 1.8.  Med adjustments as above. - Follow closely   4. Medtronic PPM for symptomatic bradycardia. 5. Recent UTI: On Cipro, will give 1 more day treatment (for total of 3) at 250 mg bid.  6. Type II diabetes: Continue home med + sliding scale insulin.  7. Dysphagia: Barium  swallow showed nothing definite.  GI following, eventual EGD.   Graciella Freer, PA-C 08/06/2015 7:52 AM  Advanced Heart Failure Team Pager [phone] (M-F; 7a - 4p)  Please contact [facility] for night-coverage after hours (4p -7a ) and weekends on [email or web address]  Patient seen with PA, agree with the above note.  Still volume overloaded, continue IV Lasix.  Creatinine up slightly, will stop lisinopril and cut back on Toprol XL with marginal BP.  Continue amiodarone infusion, plan for DCCV today.  Think we need rhythm control if possible.   [name] 08/06/2015 9:00 AM

## 2015-08-06 NOTE — Interval H&P Note (Signed)
History and Physical Interval Note:  08/06/2015 12:52 PM  Danielle Benitez  has presented today for surgery, with the diagnosis of Afib   The various methods of treatment have been discussed with the patient and family. After consideration of risks, benefits and other options for treatment, the patient has consented to  Procedure(s): CARDIOVERSION (N/A) as a surgical intervention .  The patient's history has been reviewed, patient examined, no change in status, stable for surgery.  I have reviewed the patient's chart and labs.  Questions were answered to the patient's satisfaction.     Miroslava Santellan [employer]

## 2015-08-06 NOTE — Transfer of Care (Signed)
Immediate Anesthesia Transfer of Care Note  Patient: Danielle Benitez  Procedure(s) Performed: Procedure(s): CARDIOVERSION (N/A)  Patient Location: Endoscopy Unit  Anesthesia Type:MAC  Level of Consciousness: awake, alert  and oriented  Airway & Oxygen Therapy: Patient Spontanous Breathing and Patient connected to nasal cannula oxygen  Post-op Assessment: Report given to RN and Post -op Vital signs reviewed and stable  Post vital signs: Reviewed and stable  Last Vitals:  Filed Vitals:   08/06/15 1135  BP: 107/62  Pulse: 98  Temp: 36.6 C  Resp: 22    Complications: No apparent anesthesia complications

## 2015-08-06 NOTE — Progress Notes (Signed)
Paged fellow MD on call Virgina Organ for clients low BP 86/42. The client is asymtomatic and easy to wake up and hold a conversation, heart rate in the upper 90's to low 100's in atrial fibulation on amiodarone 30 mg/hr. MD ordered a 250 bolus to run over 2 hours. Will continue to monitor client closely

## 2015-08-07 DIAGNOSIS — I480 Paroxysmal atrial fibrillation: Secondary | ICD-10-CM

## 2015-08-07 LAB — CBC
HEMATOCRIT: 32.2 % — AB (ref 36.0–46.0)
Hemoglobin: 9.6 g/dL — ABNORMAL LOW (ref 12.0–15.0)
MCH: 25.2 pg — AB (ref 26.0–34.0)
MCHC: 29.8 g/dL — AB (ref 30.0–36.0)
MCV: 84.5 fL (ref 78.0–100.0)
Platelets: 102 10*3/uL — ABNORMAL LOW (ref 150–400)
RBC: 3.81 MIL/uL — AB (ref 3.87–5.11)
RDW: 23.1 % — ABNORMAL HIGH (ref 11.5–15.5)
WBC: 6.1 10*3/uL (ref 4.0–10.5)

## 2015-08-07 LAB — BASIC METABOLIC PANEL
ANION GAP: 8 (ref 5–15)
BUN: 34 mg/dL — ABNORMAL HIGH (ref 6–20)
CALCIUM: 8.6 mg/dL — AB (ref 8.9–10.3)
CHLORIDE: 98 mmol/L — AB (ref 101–111)
CO2: 30 mmol/L (ref 22–32)
Creatinine, Ser: 2.01 mg/dL — ABNORMAL HIGH (ref 0.44–1.00)
GFR calc non Af Amer: 22 mL/min — ABNORMAL LOW (ref >60)
GFR, EST AFRICAN AMERICAN: 26 mL/min — AB (ref >60)
GLUCOSE: 150 mg/dL — AB (ref 65–99)
POTASSIUM: 4 mmol/L (ref 3.5–5.1)
Sodium: 136 mmol/L (ref 135–145)

## 2015-08-07 LAB — GLUCOSE, CAPILLARY
GLUCOSE-CAPILLARY: 129 mg/dL — AB (ref 65–99)
GLUCOSE-CAPILLARY: 185 mg/dL — AB (ref 65–99)
GLUCOSE-CAPILLARY: 190 mg/dL — AB (ref 65–99)
Glucose-Capillary: 123 mg/dL — ABNORMAL HIGH (ref 65–99)

## 2015-08-07 NOTE — Telephone Encounter (Signed)
Patient admitted to the hospital.

## 2015-08-07 NOTE — Progress Notes (Signed)
          Daily Rounding Note  08/07/2015, 8:45 AM  LOS: 3 days   SUBJECTIVE:       Feels better.  Breathing is fine.  No pain.  No dysphagia, eating well on normal HH carb mod diet.    OBJECTIVE:         Vital signs in last 24 hours:    Temp:  [97.6 F (36.4 C)-98.3 F (36.8 C)] 97.7 F (36.5 C) (10/27 0739) Pulse Rate:  [67-98] 72 (10/27 0739) Resp:  [16-22] 18 (10/27 0739) BP: (73-108)/(46-66) 101/66 mmHg (10/27 0739) SpO2:  [96 %-99 %] 99 % (10/27 0739) Weight:  [164 lb (74.39 kg)-169 lb 3.2 oz (76.749 kg)] 169 lb 3.2 oz (76.749 kg) (10/27 0602) Last BM Date: 08/05/15 Filed Weights   08/06/15 0400 08/06/15 1135 08/07/15 0602  Weight: 164 lb 10.9 oz (74.7 kg) 164 lb (74.39 kg) 169 lb 3.2 oz (76.749 kg)   General: pleasant, looks well.    Heart: RRR Chest: some coarse rales in right base.  No dyspnea or cough.  Abdomen: soft, NT, hypoactive BS.  ND  Extremities: No CCE Neuro/Psych:  Pleasant, oriented x 3.  In good spirits.   ASSESMENT:   *  Dysphagia.   10/23 esophagram: no stricture or dysmotility.   *  A fib s/p succesful cardioversion 10/26 (recurrent a fib post DCCV earlier 10/14).  Eliquis in place. Previously placed PPM.   *  Anemia, chronic dating to 2013.  MCV normal. On po Iron..   *  Thrombocytopenia. Hx same in 2013.   *  IDDM  *  CHF.  EF 35 to 40%.    PLAN   *  egd tmrw.  Diagnostic study only as on Eliquis.   *  In discussing her recent heart issues, it came up that pt does not have living will or advanced directives and is interested in pursuing this.  RN tells me chaplain normally covers this so will ask if pt, dtr and chaplain can meet in order to sort this out.  She notes that her husband did not have advanced directives and underwent multiple invasive processes near the end of his life, MD at [facility] time told her that if he had AD/LW he could have avoided some of this.   Jennye Moccasin   08/07/2015, 8:45 AM Pager: [phone]  Agree with Ms. Heron Nay assessment and plan. Iva Boop, MD, [facility]

## 2015-08-07 NOTE — Evaluation (Addendum)
Occupational Therapy Evaluation and Discharge Patient Details Name: Danielle Benitez MRN: [id number] DOB: May 07, 1932 Today's Date: 08/07/2015    History of Present Illness 81 yo with history of HTN but also orthostatic hypotension, symptomatic bradycardia with Medtronic PPM, paroxysmal atrial fibrillation chronic systolic CHF, CKD stage III, and iron deficiency anemia presents to ER with worsening dyspnea and weight gain.   Clinical Impression   This 81 yo female admitted with above presents to acute OT at a S level and should progress to Mod I/intermittent S level which she has at home. Pt at rest HR 73 and O2 98 RA, post ambulation in hallway to nursing station and back HR 87 and O2 on RA 100.Acute OT will sign off.    Follow Up Recommendations  No OT follow up    Equipment Recommendations  None recommended by OT       Precautions / Restrictions Precautions Precautions: Fall Restrictions Weight Bearing Restrictions: No      Mobility Bed Mobility Overal bed mobility: Modified Independent Bed Mobility: Supine to Sit;Sit to Supine     Supine to sit: Modified independent (Device/Increase time);HOB elevated Sit to supine: HOB elevated;Modified independent (Device/Increase time)      Transfers Overall transfer level: Needs assistance Equipment used: Rolling walker (2 wheeled) Transfers: Sit to/from Stand Sit to Stand: Supervision         General transfer comment: Supervision for safety.     Balance Overall balance assessment: Needs assistance Sitting-balance support: Feet supported;No upper extremity supported Sitting balance-Leahy Scale: Good     Standing balance support: During functional activity;Bilateral upper extremity supported Standing balance-Leahy Scale: Fair Standing balance comment: uses RW                            ADL Overall ADL's : Needs assistance/impaired                                       General ADL Comments:  Pt overall at a S level for basic ADLs with RW use. We did talk about my recommendation for a tub seat for her for safety               Pertinent Vitals/Pain Pain Assessment: Faces Faces Pain Scale: Hurts a little bit Pain Location: BLEs -OA knees and gouty feet Pain Descriptors / Indicators: Aching;Sore Pain Intervention(s): Monitored during session     Hand Dominance Right   Extremity/Trunk Assessment Upper Extremity Assessment Upper Extremity Assessment: Overall WFL for tasks assessed   Lower Extremity Assessment Lower Extremity Assessment: Defer to PT evaluation       Communication Communication Communication: No difficulties   Cognition Arousal/Alertness: Awake/alert Behavior During Therapy: WFL for tasks assessed/performed Overall Cognitive Status: Within Functional Limits for tasks assessed (other than decreased safety with RW use.)                                Home Living Family/patient expects to be discharged to:: Private residence Living Arrangements: Children;Other relatives Available Help at Discharge: Family;Available PRN/intermittently Type of Home: Apartment Home Access: Level entry     Home Layout: One level     Bathroom Shower/Tub: Tub/shower unit;Curtain   Firefighter: Standard     Home Equipment: Environmental consultant - 2 wheels;Cane - quad;Cane - single point  Prior Functioning/Environment Level of Independence: Independent with assistive device(s)        Comments: Pt notes she was mostly using SPC for ambulation until recently when she started using RW.    OT Diagnosis: Generalized weakness         OT Goals(Current goals can be found in the care plan section) Acute Rehab OT Goals Patient Stated Goal: to go home and not have to come back  OT Frequency:                End of Session Equipment Utilized During Treatment: Gait belt;Rolling walker  Activity Tolerance: Patient tolerated treatment well Patient  left: in bed;with bed alarm set;with call bell/phone within reach   Time: 1610-9604 OT Time Calculation (min): 24 min Charges:  OT General Charges $OT Visit: 1 Procedure OT Evaluation $Initial OT Evaluation Tier I: 1 Procedure OT Treatments $Self Care/Home Management : 8-22 mins  Evette Georges [phone] 08/07/2015, 3:02 PM

## 2015-08-07 NOTE — Progress Notes (Signed)
   08/07/15 1100  Clinical Encounter Type  Visited With Patient;Health care provider  Visit Type Follow-up (Adv. Directive)  Referral From Nurse;Physician  Consult/Referral To Chaplain  Spiritual Encounters  Spiritual Needs Literature  Advance Directives (For Healthcare)  Does patient have an advance directive? No  Would patient like information on creating an advanced directive? Yes - Educational materials given  CH responded to consult; CH explained to RN that ADV Directive not able to notarize/complete after 4 PM; This was explained to pt who will review materials with daughter and son after 6PM today and complete document this evening; CH will arrange for notarization and completion upon request between 9-4.

## 2015-08-07 NOTE — Care Management Important Message (Signed)
Important Message  Patient Details  Name: Danielle Benitez MRN: [id number] Date of Birth: 10-16-1931   Medicare Important Message Given:  Yes-second notification given    Kyla Balzarine 08/07/2015, 10:39 AM

## 2015-08-07 NOTE — Evaluation (Signed)
Physical Therapy Evaluation Patient Details Name: Danielle Benitez MRN: [id number] DOB: [birth year] Today's Date: 08/07/2015   History of Present Illness  81 yo with history of HTN but also orthostatic hypotension, symptomatic bradycardia with Medtronic PPM, paroxysmal atrial fibrillation chronic systolic CHF, CKD stage III, and iron deficiency anemia presents to ER with worsening dyspnea and weight gain.  Clinical Impression  Pt admitted with/for afib, worsening dyspnea, s/p cardioversion.  Pt currently limited functionally due to the problems listed below.  (see problems list.)  Pt will benefit from PT to maximize function and safety to be able to get home safely with available assist of family.     Follow Up Recommendations Home health PT;Supervision - Intermittent    Equipment Recommendations  None recommended by PT    Recommendations for Other Services       Precautions / Restrictions Precautions Precautions: Fall Restrictions Weight Bearing Restrictions: No      Mobility  Bed Mobility Overal bed mobility: Modified Independent Bed Mobility: Supine to Sit;Sit to Supine     Supine to sit: Modified independent (Device/Increase time);HOB elevated Sit to supine: HOB elevated;Modified independent (Device/Increase time)      Transfers Overall transfer level: Needs assistance Equipment used: Rolling walker (2 wheeled) Transfers: Sit to/from Stand Sit to Stand: Supervision         General transfer comment: Supervision for safety.   Ambulation/Gait Ambulation/Gait assistance: Supervision Ambulation Distance (Feet): 250 Feet   Gait Pattern/deviations: Step-through pattern;Trendelenburg;Antalgic Gait velocity: slower   General Gait Details: Gait generally steady with the RW, but described as rolling or limping gait with R hip drop and mild pelvic instability which likely would make getting back to the cane difficult.  Stairs            Wheelchair Mobility     Modified Rankin (Stroke Patients Only)       Balance Overall balance assessment: Needs assistance Sitting-balance support: No upper extremity supported Sitting balance-Leahy Scale: Good     Standing balance support: During functional activity;Bilateral upper extremity supported Standing balance-Leahy Scale: Fair Standing balance comment: uses RW                             Pertinent Vitals/Pain Pain Assessment: Faces Faces Pain Scale: Hurts a little bit Pain Location: R LE > L LE Pain Descriptors / Indicators: Aching;Sore Pain Intervention(s): Monitored during session    Home Living Family/patient expects to be discharged to:: Private residence Living Arrangements: Children;Other relatives Available Help at Discharge: Family;Available PRN/intermittently Type of Home: Apartment Home Access: Level entry     Home Layout: One level Home Equipment: Walker - 2 wheels;Cane - quad;Cane - single point      Prior Function Level of Independence: Independent with assistive device(s)         Comments: Pt notes she was mostly using SPC for ambulation until recently when she started using RW.     Hand Dominance   Dominant Hand: Right    Extremity/Trunk Assessment   Upper Extremity Assessment: Defer to OT evaluation           Lower Extremity Assessment: Overall WFL for tasks assessed (proximal and lower trunk weaknesses bil)         Communication   Communication: No difficulties  Cognition Arousal/Alertness: Awake/alert Behavior During Therapy: WFL for tasks assessed/performed Overall Cognitive Status: Within Functional Limits for tasks assessed (other than decreased safety with RW use.)  General Comments General comments (skin integrity, edema, etc.): sats on RA 99/100% and EHR 95 bpm    Exercises        Assessment/Plan    PT Assessment Patient needs continued PT services  PT Diagnosis     PT Problem List  Decreased activity tolerance;Decreased strength;Decreased mobility  PT Treatment Interventions DME instruction;Gait training;Functional mobility training;Therapeutic activities;Therapeutic exercise   PT Goals (Current goals can be found in the Care Plan section) Acute Rehab PT Goals Patient Stated Goal: back to using the cane if I can PT Goal Formulation: With patient Time For Goal Achievement: 08/14/15 Potential to Achieve Goals: Good    Frequency Min 3X/week   Barriers to discharge Decreased caregiver support      Co-evaluation               End of Session     Patient left: in bed;with call bell/phone within reach;with family/visitor present Nurse Communication: Mobility status         Time: 1725-1745 PT Time Calculation (min) (ACUTE ONLY): 20 min   Charges:   PT Evaluation $Initial PT Evaluation Tier I: 1 Procedure     PT G Codes:        Arvil Utz, Eliseo Gum 08/07/2015, 5:56 PM 08/07/2015  [place] Bing, PT [phone]  (pager)

## 2015-08-07 NOTE — Progress Notes (Signed)
Inpatient Diabetes Program Recommendations  AACE/ADA: New Consensus Statement on Inpatient Glycemic Control (2015)  Target Ranges:  Prepandial:   less than 140 mg/dL      Peak postprandial:   less than 180 mg/dL (1-2 hours)      Critically ill patients:  140 - 180 mg/dL   Review of Glycemic Control  Results for MINTIE, Danielle Benitez (MRN [id number]) as of 08/07/2015 09:44  Ref. Range 08/06/2015 07:54 08/06/2015 14:03 08/06/2015 18:01 08/06/2015 20:55 08/07/2015 07:37  Glucose-Capillary Latest Ref Range: 65-99 mg/dL 025 (H) 852 (H) 778 (H) 253 (H) 129 (H)   Diabetes history: Type 2 Outpatient Diabetes medications: Onglyza 2.5mg  qday Current orders for Inpatient glycemic control: Novolog 0-15 units tid, Novolog 0-5 units qhs, Tradgenta 5mg  qday  Agree with current diabetes medications orders.  [facility] Case management is aware patient is inpatient- will follow up on discharge.  Susette Racer, RN, BA, MHA, CDE Diabetes Coordinator Inpatient Diabetes Program  [phone] (Team Pager) [phone] [facility] Office) 08/07/2015 10:06 AM   Inpatient Diabetes Program Recommendations:

## 2015-08-07 NOTE — Progress Notes (Signed)
Patient ID: Danielle Benitez, female   DOB: [birth year], 81 y.o.   MRN: [id number]   SUBJECTIVE:  Feels "worlds better".  DCCV to NSR on 10/26.  Does not want to go home just to come right back.  Motivated to stay healthy at this time, asks about salt and fluid restrictions. EGD planned for tomorrow.  Weight shows up 5 lbs despite IV lasix 40 mg q8hrs.  Creatinine up to 2.01 today.   Scheduled Meds: . allopurinol  300 mg Oral Daily  . apixaban  2.5 mg Oral BID  . atorvastatin  20 mg Oral q1800  . DULoxetine  20 mg Oral QHS  . furosemide  40 mg Intravenous 3 times per day  . insulin aspart  0-15 Units Subcutaneous TID WC  . insulin aspart  0-5 Units Subcutaneous QHS  . iron polysaccharides  150 mg Oral BID  . linagliptin  5 mg Oral Daily  . metoprolol succinate  25 mg Oral Daily  . montelukast  10 mg Oral QHS  . pantoprazole  40 mg Oral Daily  . potassium chloride  20 mEq Oral Daily  . sodium chloride  3 mL Intravenous Q12H  . sodium chloride  3 mL Intravenous Q12H   Continuous Infusions: . sodium chloride    . sodium chloride    . amiodarone 30 mg/hr (08/07/15 0155)   PRN Meds:.sodium chloride, acetaminophen, albuterol, ondansetron (ZOFRAN) IV, promethazine, senna-docusate, sodium chloride, sodium chloride, traZODone, triamcinolone    Filed Vitals:   08/07/15 0012 08/07/15 0500 08/07/15 0602 08/07/15 0739  BP: 107/59 92/54  101/66  Pulse: 70   72  Temp: 98.1 F (36.7 C) 97.8 F (36.6 C)  97.7 F (36.5 C)  TempSrc: Oral Oral  Oral  Resp: 18   18  Height:      Weight:   169 lb 3.2 oz (76.749 kg)   SpO2: 96%   99%    Intake/Output Summary (Last 24 hours) at 08/07/15 0754 Last data filed at 08/07/15 0602  Gross per 24 hour  Intake    755 ml  Output   1550 ml  Net   -795 ml    LABS: Basic Metabolic Panel:  Recent Labs  40/98/11 0335 08/07/15 0347  NA 138 136  K 4.0 4.0  CL 95* 98*  CO2 27 30  GLUCOSE 134* 150*  BUN 30* 34*  CREATININE 1.84* 2.01*  CALCIUM 9.0  8.6*   Liver Function Tests: No results for input(s): AST, ALT, ALKPHOS, BILITOT, PROT, ALBUMIN in the last 72 hours. No results for input(s): LIPASE, AMYLASE in the last 72 hours. CBC:  Recent Labs  08/06/15 0335 08/07/15 0347  WBC 5.3 6.1  HGB 9.9* 9.6*  HCT 33.7* 32.2*  MCV 85.5 84.5  PLT 110* PENDING   Cardiac Enzymes:  Recent Labs  08/05/15 0059 08/05/15 0745 08/05/15 1400  TROPONINI 0.05* 0.04* 0.04*   BNP: Invalid input(s): POCBNP D-Dimer: No results for input(s): DDIMER in the last 72 hours. Hemoglobin A1C: No results for input(s): HGBA1C in the last 72 hours. Fasting Lipid Panel: No results for input(s): CHOL, HDL, LDLCALC, TRIG, CHOLHDL, LDLDIRECT in the last 72 hours. Thyroid Function Tests:  Recent Labs  08/05/15 0059  TSH 4.340   Anemia Panel: No results for input(s): VITAMINB12, FOLATE, FERRITIN, TIBC, IRON, RETICCTPCT in the last 72 hours.  RADIOLOGY: Dg Chest 2 View  08/04/2015  CLINICAL DATA:  Patient with history of atrial fibrillation. Chest pain and heaviness. EXAM: CHEST  2  VIEW COMPARISON:  Chest radiograph 07/30/2015 FINDINGS: Multi lead pacer apparatus overlies the left hemi thorax, leads are stable in position. Stable cardiomegaly. Pulmonary vascular redistribution with mild interstitial pulmonary opacities. No large area of pulmonary consolidation. Mid thoracic spine degenerative changes. No pleural effusion pneumothorax. IMPRESSION: Cardiomegaly, pulmonary vascular redistribution and mild interstitial edema. Electronically Signed   By: Annia Belt M.D.   On: 08/04/2015 19:37   Dg Chest 2 View  07/30/2015  CLINICAL DATA:  Shortness of breath EXAM: CHEST  2 VIEW COMPARISON:  07/28/2015 chest radiograph FINDINGS: Do lead left subclavian pacemaker is stable in configuration with lead tips overlying the right atrium and right ventricle. Stable cardiomediastinal silhouette with mild-to-moderate cardiomegaly. Stable trace bilateral pleural  effusions. No pneumothorax. Mild pulmonary edema, which is worsened. No focal lung consolidation. IMPRESSION: 1. Mild congestive heart failure, worsened. 2. Stable trace bilateral pleural effusions. Electronically Signed   By: Delbert Phenix M.D.   On: 07/30/2015 11:53   Dg Chest 2 View  07/28/2015  CLINICAL DATA:  Patient admitted 1 week ago with shortness of breath. EXAM: CHEST  2 VIEW COMPARISON:  07/18/2015 FINDINGS: There is mild bilateral interstitial thickening. There is no focal parenchymal opacity. There are bilateral small pleural effusions. There is no pneumothorax. There is stable cardiomegaly. There is a dual lead cardiac pacemaker. There is severe osteoarthritis of the left glenohumeral joint. IMPRESSION: Stable cardiomegaly with mild pulmonary vascular congestion. Electronically Signed   By: Elige Ko   On: 07/28/2015 12:09   Dg Chest 2 View  07/18/2015  CLINICAL DATA:  Worsening shortness of breath EXAM: CHEST  2 VIEW COMPARISON:  07/11/2015 FINDINGS: There is mild bilateral interstitial thickening. There is no focal parenchymal opacity. There is no pleural effusion or pneumothorax. There is stable cardiomegaly. There is a dual lead cardiac pacemaker. There is severe osteoarthritis of the left glenohumeral joint. IMPRESSION: 1. Stable cardiomegaly with mild pulmonary vascular congestion. Electronically Signed   By: Elige Ko   On: 07/18/2015 11:08   Dg Chest 2 View  07/11/2015  CLINICAL DATA:  COPD. Bronchitis. Cough. Wheezing. Shortness of breath. EXAM: CHEST  2 VIEW COMPARISON:  01/11/2012 chest radiograph . FINDINGS: Stable configuration of dual lead left subclavian pacemaker with lead tips overlying the right atrium and right ventricle. Moderate enlargement of the cardiac silhouette, which appears increased in the interval. Otherwise stable mediastinal contour with mild tortuosity of the thoracic aorta. No pneumothorax. No pleural effusion. No focal lung consolidation. There is  cephalization of the pulmonary vasculature without overt pulmonary edema. Moderate degenerative changes in the visualized thoracolumbar spine. IMPRESSION: 1. Worsening moderate enlargement of the cardiac silhouette, cannot exclude pericardial effusion. 2. Cephalization of the pulmonary vasculature without overt pulmonary edema. Electronically Signed   By: Delbert Phenix M.D.   On: 07/11/2015 16:53   Dg Esophagus  08/05/2015  CLINICAL DATA:  Difficulty swallowing solids and liquids for months. EXAM: ESOPHOGRAM/BARIUM SWALLOW TECHNIQUE: Single contrast examination was performed using thin barium or water soluble. FLUOROSCOPY TIME:  Radiation Exposure Index (as provided by the fluoroscopic device): 7.8 mGy entrance does If the device does not provide the exposure index: Fluoroscopy Time:  1.2 minutes Number of Acquired Images:  None, only saved fluoroscopy COMPARISON:  None. FINDINGS: Single contrast single projection study performed due to patient's medical condition. Oblique pharyngeal imaging showed no aspiration or obstructive process. The thoracic esophagus has normal distensibility and course with no evidence of mass or stricture. A small hiatal hernia is present. Esophageal motility  could be adequately assessed due to positioning, but primary propulsion wave was effective and there was no significant stasis. No encountered gastroesophageal reflux. The 13 mm barium tablet successfully traversed the esophagus. IMPRESSION: No explanation for dysphagia. Electronically Signed   By: Marnee Spring M.D.   On: 08/05/2015 13:49   US Renal  07/21/2015  CLINICAL DATA:  Acute renal failure EXAM: RENAL / URINARY TRACT ULTRASOUND COMPLETE COMPARISON:  None. FINDINGS: Right Kidney: Length: 10.3 cm. Echogenicity within normal limits. No mass or hydronephrosis visualized. Left Kidney: Length: 10.6 cm. Mildly increased echotexture. No mass or hydronephrosis. Bladder: Appears normal for degree of bladder distention.  IMPRESSION: No hydronephrosis or acute findings. Mildly increased echotexture on the left. Electronically Signed   By: Charlett Nose M.D.   On: 07/21/2015 11:11    PHYSICAL EXAM General: NAD Neck: JVP 11-12 cm, no thyromegaly or lymphadenopathy noted.   Lungs: Mild crackles bibasilarly. CV: Nondisplaced PMI.  Heart regular rate and rhythm. Normal S1/S2, no S3/S4, 1/6 SEM RUSB.  No edema.  Abdomen: Soft, nontender, nondistended, no hepatosplenomegaly  Neurologic: Alert and oriented x 3.  Psych: Pleasant affect.  Extremities: No clubbing or cyanosis.   TELEMETRY: a-paced at 35  ASSESSMENT AND PLAN: 81 yo with history of HTN but also orthostatic hypotension, symptomatic bradycardia with Medtronic PPM, paroxysmal atrial fibrillation chronic systolic CHF, CKD stage III, and iron deficiency anemia presents to ER with worsening dyspnea and weight gain. This is her 3rd admission this month.   1. Acute on chronic systolic CHF: EF 03-21% by echo. Etiology uncertain: could be tachy-mediated cardiomyopathy, but symptoms seem to predate the onset of atrial fibrillation. Possible viral myocarditis. Cannot rule out ischemic cardiomyopathy, but no definite anginal symptoms.  - Hold lasix for now with Creatine bump - Rate control atrial fibrillation with IV amiodarone. - Continue Toprol XL 25mg  daily. - Hold lisinopril with continued low BPs and Cr bump. - Follow BMET closely.  - RHC may be helpful => still looks volume overloaded but creatinine up.  Will hold Lasix today, reassess tomorrow. 2. Atrial fibrillation: Tolerates poorly - a-paced s/p DCCV 08/06/15. - Continue Eliquis - Toprol XL 25 mg daily. 3. AKI on CKD Stage III: Creatinine 1.58 => 1.8 => 2.01.  Will hold lasix. - Follow closely   4. Medtronic PPM for symptomatic bradycardia.  She is a-pacing. 5. Recent UTI: Completed course of cipro.  6. Type II diabetes: Continue home med + sliding scale insulin.  7. Dysphagia: Barium  swallow showed nothing definite.  EGD for Friday.  Danielle Freer, PA-C 08/07/2015 7:54 AM  Advanced Heart Failure Team Pager [phone] (M-F; 7a - 4p)  Please contact [facility] for night-coverage after hours (4p -7a ) and weekends on [email or web address]  Patient seen with PA, agree with the above note.  She was cardioverted yesterday and remains out of atrial fibrillation and on amiodarone gtt.  Will continue IV amiodarone today, convert to po likely tomorrow to allow more loading time.  Continue Eliquis.   She remains volume overloaded on exam but feels much better.  BP soft, off ACEI and Toprol XL cut back.  Creatinine up to 2.  Will hold IV Lasix today, will need to restart diuresis likely tomorrow.  May end up needing RHC but will reassess tomorrow.   EGD tomorrow.   Danielle Benitez 08/07/2015 9:22 AM

## 2015-08-08 ENCOUNTER — Encounter (HOSPITAL_COMMUNITY): Payer: Self-pay | Admitting: Cardiology

## 2015-08-08 ENCOUNTER — Inpatient Hospital Stay (HOSPITAL_COMMUNITY): Payer: Medicare Other | Admitting: Anesthesiology

## 2015-08-08 ENCOUNTER — Encounter (HOSPITAL_COMMUNITY)
Admission: EM | Disposition: A | Discharge status: Home / Self Care | Payer: Self-pay | Source: Home / Self Care | Attending: Cardiology

## 2015-08-08 DIAGNOSIS — K224 Dyskinesia of esophagus: Secondary | ICD-10-CM

## 2015-08-08 DIAGNOSIS — N179 Acute kidney failure, unspecified: Secondary | ICD-10-CM

## 2015-08-08 DIAGNOSIS — R1310 Dysphagia, unspecified: Secondary | ICD-10-CM

## 2015-08-08 HISTORY — PX: ESOPHAGOGASTRODUODENOSCOPY: SHX5428

## 2015-08-08 LAB — CBC
HEMATOCRIT: 30.6 % — AB (ref 36.0–46.0)
Hemoglobin: 9.1 g/dL — ABNORMAL LOW (ref 12.0–15.0)
MCH: 24.7 pg — AB (ref 26.0–34.0)
MCHC: 29.7 g/dL — ABNORMAL LOW (ref 30.0–36.0)
MCV: 83.2 fL (ref 78.0–100.0)
PLATELETS: 100 10*3/uL — AB (ref 150–400)
RBC: 3.68 MIL/uL — ABNORMAL LOW (ref 3.87–5.11)
RDW: 23 % — AB (ref 11.5–15.5)
WBC: 5.8 10*3/uL (ref 4.0–10.5)

## 2015-08-08 LAB — BASIC METABOLIC PANEL
Anion gap: 12 (ref 5–15)
BUN: 31 mg/dL — AB (ref 6–20)
CHLORIDE: 98 mmol/L — AB (ref 101–111)
CO2: 29 mmol/L (ref 22–32)
CREATININE: 1.89 mg/dL — AB (ref 0.44–1.00)
Calcium: 9 mg/dL (ref 8.9–10.3)
GFR calc Af Amer: 28 mL/min — ABNORMAL LOW (ref >60)
GFR calc non Af Amer: 24 mL/min — ABNORMAL LOW (ref >60)
Glucose, Bld: 149 mg/dL — ABNORMAL HIGH (ref 65–99)
POTASSIUM: 3.9 mmol/L (ref 3.5–5.1)
Sodium: 139 mmol/L (ref 135–145)

## 2015-08-08 LAB — GLUCOSE, CAPILLARY
GLUCOSE-CAPILLARY: 111 mg/dL — AB (ref 65–99)
GLUCOSE-CAPILLARY: 241 mg/dL — AB (ref 65–99)
GLUCOSE-CAPILLARY: 106 mg/dL — AB (ref 65–99)
Glucose-Capillary: 153 mg/dL — ABNORMAL HIGH (ref 65–99)

## 2015-08-08 SURGERY — EGD (ESOPHAGOGASTRODUODENOSCOPY)
Anesthesia: Monitor Anesthesia Care

## 2015-08-08 MED ORDER — AMIODARONE HCL 200 MG PO TABS
400.0000 mg | ORAL_TABLET | Freq: Two times a day (BID) | ORAL | Status: DC
  Administered 2015-08-08 – 2015-08-09 (×4): 400 mg via ORAL
  Filled 2015-08-08 (×4): qty 2

## 2015-08-08 MED ORDER — POTASSIUM CHLORIDE CRYS ER 20 MEQ PO TBCR
40.0000 meq | EXTENDED_RELEASE_TABLET | Freq: Once | ORAL | Status: AC
  Administered 2015-08-08: 40 meq via ORAL
  Filled 2015-08-08: qty 2

## 2015-08-08 MED ORDER — BUTAMBEN-TETRACAINE-BENZOCAINE 2-2-14 % EX AERO
INHALATION_SPRAY | CUTANEOUS | Status: DC | PRN
  Administered 2015-08-08: 2 via TOPICAL

## 2015-08-08 MED ORDER — LIDOCAINE HCL (CARDIAC) 20 MG/ML IV SOLN
INTRAVENOUS | Status: DC | PRN
  Administered 2015-08-08: 50 mg via INTRAVENOUS

## 2015-08-08 MED ORDER — FUROSEMIDE 10 MG/ML IJ SOLN
80.0000 mg | Freq: Two times a day (BID) | INTRAMUSCULAR | Status: AC
  Administered 2015-08-08 (×2): 80 mg via INTRAVENOUS
  Filled 2015-08-08 (×2): qty 8

## 2015-08-08 MED ORDER — PROPOFOL 10 MG/ML IV BOLUS
INTRAVENOUS | Status: DC | PRN
  Administered 2015-08-08 (×2): 30 mg via INTRAVENOUS

## 2015-08-08 NOTE — Transfer of Care (Signed)
Immediate Anesthesia Transfer of Care Note  Patient: Danielle Benitez  Procedure(s) Performed: Procedure(s): ESOPHAGOGASTRODUODENOSCOPY (EGD) (N/A)  Patient Location: PACU  Anesthesia Type:MAC  Level of Consciousness: awake, alert  and oriented  Airway & Oxygen Therapy: Patient Spontanous Breathing and Patient connected to nasal cannula oxygen  Post-op Assessment: Report given to RN and Post -op Vital signs reviewed and stable  Post vital signs: Reviewed and stable  Last Vitals:  Filed Vitals:   08/08/15 1016  BP: 101/53  Pulse: 72  Temp:   Resp: 14    Complications: No apparent anesthesia complications

## 2015-08-08 NOTE — Anesthesia Postprocedure Evaluation (Signed)
  Anesthesia Post-op Note  Patient: Danielle Benitez  Procedure(s) Performed: Procedure(s): ESOPHAGOGASTRODUODENOSCOPY (EGD) (N/A)  Patient Location: PACU and Endoscopy Unit  Anesthesia Type:MAC  Level of Consciousness: awake  Airway and Oxygen Therapy: Patient Spontanous Breathing  Post-op Pain: mild  Post-op Assessment: Post-op Vital signs reviewed              Post-op Vital Signs: Reviewed  Last Vitals:  Filed Vitals:   08/08/15 1016  BP: 101/53  Pulse: 72  Temp:   Resp: 14    Complications: No apparent anesthesia complications

## 2015-08-08 NOTE — Care Management Important Message (Signed)
Important Message  Patient Details  Name: Danielle Benitez MRN: [id number] Date of Birth: [birth year]   Medicare Important Message Given:  Yes-third notification given    Kyla Balzarine 08/08/2015, 11:58 AM

## 2015-08-08 NOTE — Progress Notes (Signed)
Patient ID: Danielle Benitez, female   DOB: 10-11-1932, 81 y.o.   MRN: [id number]   SUBJECTIVE:  Feels "worlds better".  DCCV to NSR on 10/26.  EGD today at 10.  I/O nearly even.  Down 1 lb with holding of lasix yesterday.  Creatinine 2.01 => 1.89  Scheduled Meds: . allopurinol  300 mg Oral Daily  . apixaban  2.5 mg Oral BID  . atorvastatin  20 mg Oral q1800  . DULoxetine  20 mg Oral QHS  . insulin aspart  0-15 Units Subcutaneous TID WC  . insulin aspart  0-5 Units Subcutaneous QHS  . iron polysaccharides  150 mg Oral BID  . linagliptin  5 mg Oral Daily  . metoprolol succinate  25 mg Oral Daily  . montelukast  10 mg Oral QHS  . pantoprazole  40 mg Oral Daily  . potassium chloride  20 mEq Oral Daily  . sodium chloride  3 mL Intravenous Q12H   Continuous Infusions: . sodium chloride    . sodium chloride 20 mL/hr at 08/08/15 0620  . amiodarone 30 mg/hr (08/08/15 0310)   PRN Meds:.acetaminophen, albuterol, ondansetron (ZOFRAN) IV, promethazine, senna-docusate, sodium chloride, traZODone, triamcinolone    Filed Vitals:   08/07/15 2120 08/08/15 0024 08/08/15 0400 08/08/15 0717  BP: 115/70 102/53 111/62 117/64  Pulse: 71 70 64 69  Temp: 98.5 F (36.9 C) 98 F (36.7 C) 97.7 F (36.5 C) 98.1 F (36.7 C)  TempSrc: Oral Oral Oral Oral  Resp: Height:      Weight:   168 lb 10.4 oz (76.5 kg)   SpO2: 99% 100% 98% 97%    Intake/Output Summary (Last 24 hours) at 08/08/15 0744 Last data filed at 08/08/15 0523  Gross per 24 hour  Intake 1485.2 ml  Output   1500 ml  Net  -14.8 ml    LABS: Basic Metabolic Panel:  Recent Labs  40/98/11 0347 08/08/15 0252  NA 136 139  K 4.0 3.9  CL 98* 98*  CO2 30 29  GLUCOSE 150* 149*  BUN 34* 31*  CREATININE 2.01* 1.89*  CALCIUM 8.6* 9.0   Liver Function Tests: No results for input(s): AST, ALT, ALKPHOS, BILITOT, PROT, ALBUMIN in the last 72 hours. No results for input(s): LIPASE, AMYLASE in the last 72 hours. CBC:  Recent  Labs  08/07/15 0347 08/08/15 0252  WBC 6.1 5.8  HGB 9.6* 9.1*  HCT 32.2* 30.6*  MCV 84.5 83.2  PLT 102* 100*   Cardiac Enzymes:  Recent Labs  08/05/15 0745 08/05/15 1400  TROPONINI 0.04* 0.04*   BNP: Invalid input(s): POCBNP D-Dimer: No results for input(s): DDIMER in the last 72 hours. Hemoglobin A1C: No results for input(s): HGBA1C in the last 72 hours. Fasting Lipid Panel: No results for input(s): CHOL, HDL, LDLCALC, TRIG, CHOLHDL, LDLDIRECT in the last 72 hours. Thyroid Function Tests: No results for input(s): TSH, T4TOTAL, T3FREE, THYROIDAB in the last 72 hours.  Invalid input(s): FREET3 Anemia Panel: No results for input(s): VITAMINB12, FOLATE, FERRITIN, TIBC, IRON, RETICCTPCT in the last 72 hours.  RADIOLOGY: Dg Chest 2 View  08/04/2015  CLINICAL DATA:  Patient with history of atrial fibrillation. Chest pain and heaviness. EXAM: CHEST  2 VIEW COMPARISON:  Chest radiograph 07/30/2015 FINDINGS: Multi lead pacer apparatus overlies the left hemi thorax, leads are stable in position. Stable cardiomegaly. Pulmonary vascular redistribution with mild interstitial pulmonary opacities. No large area of pulmonary consolidation. Mid thoracic spine degenerative changes. No pleural effusion  pneumothorax. IMPRESSION: Cardiomegaly, pulmonary vascular redistribution and mild interstitial edema. Electronically Signed   By: Annia Belt M.D.   On: 08/04/2015 19:37   Dg Chest 2 View  07/30/2015  CLINICAL DATA:  Shortness of breath EXAM: CHEST  2 VIEW COMPARISON:  07/28/2015 chest radiograph FINDINGS: Do lead left subclavian pacemaker is stable in configuration with lead tips overlying the right atrium and right ventricle. Stable cardiomediastinal silhouette with mild-to-moderate cardiomegaly. Stable trace bilateral pleural effusions. No pneumothorax. Mild pulmonary edema, which is worsened. No focal lung consolidation. IMPRESSION: 1. Mild congestive heart failure, worsened. 2. Stable trace  bilateral pleural effusions. Electronically Signed   By: Delbert Phenix M.D.   On: 07/30/2015 11:53   Dg Chest 2 View  07/28/2015  CLINICAL DATA:  Patient admitted 1 week ago with shortness of breath. EXAM: CHEST  2 VIEW COMPARISON:  07/18/2015 FINDINGS: There is mild bilateral interstitial thickening. There is no focal parenchymal opacity. There are bilateral small pleural effusions. There is no pneumothorax. There is stable cardiomegaly. There is a dual lead cardiac pacemaker. There is severe osteoarthritis of the left glenohumeral joint. IMPRESSION: Stable cardiomegaly with mild pulmonary vascular congestion. Electronically Signed   By: Elige Ko   On: 07/28/2015 12:09   Dg Chest 2 View  07/18/2015  CLINICAL DATA:  Worsening shortness of breath EXAM: CHEST  2 VIEW COMPARISON:  07/11/2015 FINDINGS: There is mild bilateral interstitial thickening. There is no focal parenchymal opacity. There is no pleural effusion or pneumothorax. There is stable cardiomegaly. There is a dual lead cardiac pacemaker. There is severe osteoarthritis of the left glenohumeral joint. IMPRESSION: 1. Stable cardiomegaly with mild pulmonary vascular congestion. Electronically Signed   By: Elige Ko   On: 07/18/2015 11:08   Dg Chest 2 View  07/11/2015  CLINICAL DATA:  COPD. Bronchitis. Cough. Wheezing. Shortness of breath. EXAM: CHEST  2 VIEW COMPARISON:  01/11/2012 chest radiograph . FINDINGS: Stable configuration of dual lead left subclavian pacemaker with lead tips overlying the right atrium and right ventricle. Moderate enlargement of the cardiac silhouette, which appears increased in the interval. Otherwise stable mediastinal contour with mild tortuosity of the thoracic aorta. No pneumothorax. No pleural effusion. No focal lung consolidation. There is cephalization of the pulmonary vasculature without overt pulmonary edema. Moderate degenerative changes in the visualized thoracolumbar spine. IMPRESSION: 1. Worsening  moderate enlargement of the cardiac silhouette, cannot exclude pericardial effusion. 2. Cephalization of the pulmonary vasculature without overt pulmonary edema. Electronically Signed   By: Delbert Phenix M.D.   On: 07/11/2015 16:53   Dg Esophagus  08/05/2015  CLINICAL DATA:  Difficulty swallowing solids and liquids for months. EXAM: ESOPHOGRAM/BARIUM SWALLOW TECHNIQUE: Single contrast examination was performed using thin barium or water soluble. FLUOROSCOPY TIME:  Radiation Exposure Index (as provided by the fluoroscopic device): 7.8 mGy entrance does If the device does not provide the exposure index: Fluoroscopy Time:  1.2 minutes Number of Acquired Images:  None, only saved fluoroscopy COMPARISON:  None. FINDINGS: Single contrast single projection study performed due to patient's medical condition. Oblique pharyngeal imaging showed no aspiration or obstructive process. The thoracic esophagus has normal distensibility and course with no evidence of mass or stricture. A small hiatal hernia is present. Esophageal motility could be adequately assessed due to positioning, but primary propulsion wave was effective and there was no significant stasis. No encountered gastroesophageal reflux. The 13 mm barium tablet successfully traversed the esophagus. IMPRESSION: No explanation for dysphagia. Electronically Signed   By: Marnee Spring  M.D.   On: 08/05/2015 13:49   US Renal  07/21/2015  CLINICAL DATA:  Acute renal failure EXAM: RENAL / URINARY TRACT ULTRASOUND COMPLETE COMPARISON:  None. FINDINGS: Right Kidney: Length: 10.3 cm. Echogenicity within normal limits. No mass or hydronephrosis visualized. Left Kidney: Length: 10.6 cm. Mildly increased echotexture. No mass or hydronephrosis. Bladder: Appears normal for degree of bladder distention. IMPRESSION: No hydronephrosis or acute findings. Mildly increased echotexture on the left. Electronically Signed   By: Charlett Nose M.D.   On: 07/21/2015 11:11    PHYSICAL  EXAM General: NAD Neck: JVP 11-12 cm, no thyromegaly or lymphadenopathy noted.   Lungs: Mild crackles bibasilarly. CV: Nondisplaced PMI.  Heart regular rate and rhythm. Normal S1/S2, no S3/S4, 1/6 SEM RUSB.  No edema.  Abdomen: Soft, nontender, nondistended, no hepatosplenomegaly  Neurologic: Alert and oriented x 3.  Psych: Pleasant affect.  Extremities: No clubbing or cyanosis.   TELEMETRY: a-paced at 75  ASSESSMENT AND PLAN: 81 yo with history of HTN but also orthostatic hypotension, symptomatic bradycardia with Medtronic PPM, paroxysmal atrial fibrillation chronic systolic CHF, CKD stage III, and iron deficiency anemia presents to ER with worsening dyspnea and weight gain. This is her 3rd admission this month.   1. Acute on chronic systolic CHF: EF 22-97% by echo. Etiology uncertain: could be tachy-mediated cardiomyopathy, but symptoms seem to predate the onset of atrial fibrillation. Possible viral myocarditis. Cannot rule out ischemic cardiomyopathy, but no definite anginal symptoms.  - Creatinine down.  Will give IV Lasix today. - Rate control atrial fibrillation with IV amiodarone. Transition to po amio. - Continue Toprol XL  daily. - Hold lisinopril with continued low BPs and Cr bump. - Follow BMET closely.  - RHC may be helpful => Remains volume overloaded but creatinine up. Lasix held 08/07/15.  Will see how she does with Lasix today, possible RHC Monday.  2. Atrial fibrillation: Tolerates poorly.  A-paced s/p DCCV 08/06/15. - Continue Eliquis - Toprol XL 25 mg daily. 3. AKI on CKD Stage III: Creatinine 1.58 => 1.8 => 2.01 => 1.89.  Will give IV Lasix today, follow creatinine closely.  4. Medtronic PPM for symptomatic bradycardia.  She is a-pacing. 5. Recent UTI: Completed course of cipro.  6. Type II diabetes: Continue home med + sliding scale insulin.  7. Dysphagia: Barium swallow showed nothing definite.  EGD today.  Graciella Freer,  PA-C 08/08/2015 7:44 AM  Advanced Heart Failure Team Pager [phone] (M-F; 7a - 4p)  Please contact [facility] for night-coverage after hours (4p -7a ) and weekends on [email or web address]  Patient seen with NP, agree with the above note.  Still volume overloaded.  Lasix held yesterday, creatinine lower today.  Is staying out of atrial fibrillation and feels better.  - Will give Lasix 80 mg IV bid x 2 doses today, reassess creatinine and volume status tomorrow.  If volume remains difficult to control, RHC on Monday.  If diureses, possibly home over weekend.  - Convert amiodarone to po.  - EGD today.   [name] 08/08/2015 8:36 AM

## 2015-08-08 NOTE — Anesthesia Preprocedure Evaluation (Addendum)
Anesthesia Evaluation  Patient identified by MRN, date of birth, ID band Patient awake    Reviewed: Allergy & Precautions, NPO status   Airway Mallampati: II  TM Distance: >3 FB Neck ROM: Full    Dental   Pulmonary shortness of breath, COPD,    breath sounds clear to auscultation       Cardiovascular hypertension, + CAD and +CHF  + dysrhythmias + pacemaker  Rhythm:Regular Rate:Normal     Neuro/Psych    GI/Hepatic Neg liver ROS, GI history noted. CE   Endo/Other  diabetes  Renal/GU Renal disease     Musculoskeletal   Abdominal   Peds  Hematology   Anesthesia Other Findings   Reproductive/Obstetrics                            Anesthesia Physical Anesthesia Plan  ASA: III  Anesthesia Plan: MAC   Post-op Pain Management:    Induction: Intravenous  Airway Management Planned:   Additional Equipment:   Intra-op Plan:   Post-operative Plan:   Informed Consent: I have reviewed the patients History and Physical, chart, labs and discussed the procedure including the risks, benefits and alternatives for the proposed anesthesia with the patient or authorized representative who has indicated his/her understanding and acceptance.   Dental advisory given  Plan Discussed with: CRNA and Anesthesiologist  Anesthesia Plan Comments:         Anesthesia Quick Evaluation

## 2015-08-08 NOTE — Op Note (Signed)
[facility] [address] [place], [address]   ENDOSCOPY PROCEDURE REPORT  PATIENT: Danielle Benitez, Danielle Benitez  MR#: [id number] BIRTHDATE: May 02, 1932 , 83  yrs. old GENDER: female ENDOSCOPIST: Iva Boop, MD, [facility] PROCEDURE DATE:  08/08/2015 PROCEDURE:  EGD, diagnostic ASA CLASS:     Class III INDICATIONS:  dysphagia. MEDICATIONS: Per Anesthesia and Monitored anesthesia care TOPICAL ANESTHETIC: none  DESCRIPTION OF PROCEDURE: After the risks benefits and alternatives of the procedure were thoroughly explained, informed consent was obtained.  The PENTAX GASTOROSCOPE [id number] endoscope was introduced through the mouth and advanced to the second portion of the duodenum , Without limitations.  The instrument was slowly withdrawn as the mucosa was fully examined.    Tortuous esophagus (c/w dysmotility as seen on Ba swallow) and small hiatal hernia. Otherwise normal.  Retroflexed views revealed as previously described.     The scope was then withdrawn from the patient and the procedure completed.  COMPLICATIONS: There were no immediate complications.  ENDOSCOPIC IMPRESSION: Tortuous esophagus (c/w dysmotility as seen on Ba swallow) and small hiatal hernia. Otherwise normal  RECOMMENDATIONS: Dysphagia 3 diet f/u GI prn   eSigned:  Iva Boop, MD, [facility] 08/08/2015 10:25 AM    CC:The Patient

## 2015-08-09 LAB — BASIC METABOLIC PANEL
ANION GAP: 14 (ref 5–15)
BUN: 24 mg/dL — ABNORMAL HIGH (ref 6–20)
CALCIUM: 9.7 mg/dL (ref 8.9–10.3)
CHLORIDE: 97 mmol/L — AB (ref 101–111)
CO2: 31 mmol/L (ref 22–32)
CREATININE: 1.71 mg/dL — AB (ref 0.44–1.00)
GFR calc Af Amer: 31 mL/min — ABNORMAL LOW (ref >60)
GFR calc non Af Amer: 27 mL/min — ABNORMAL LOW (ref >60)
GLUCOSE: 101 mg/dL — AB (ref 65–99)
Potassium: 4.4 mmol/L (ref 3.5–5.1)
Sodium: 142 mmol/L (ref 135–145)

## 2015-08-09 LAB — CBC
HCT: 36.3 % (ref 36.0–46.0)
HEMOGLOBIN: 10.8 g/dL — AB (ref 12.0–15.0)
MCH: 25.3 pg — AB (ref 26.0–34.0)
MCHC: 29.8 g/dL — AB (ref 30.0–36.0)
MCV: 85 fL (ref 78.0–100.0)
Platelets: 131 10*3/uL — ABNORMAL LOW (ref 150–400)
RBC: 4.27 MIL/uL (ref 3.87–5.11)
RDW: 23.5 % — ABNORMAL HIGH (ref 11.5–15.5)
WBC: 5.7 10*3/uL (ref 4.0–10.5)

## 2015-08-09 LAB — GLUCOSE, CAPILLARY
GLUCOSE-CAPILLARY: 111 mg/dL — AB (ref 65–99)
GLUCOSE-CAPILLARY: 124 mg/dL — AB (ref 65–99)
Glucose-Capillary: 234 mg/dL — ABNORMAL HIGH (ref 65–99)
Glucose-Capillary: 175 mg/dL — ABNORMAL HIGH (ref 65–99)

## 2015-08-09 MED ORDER — FUROSEMIDE 10 MG/ML IJ SOLN
80.0000 mg | Freq: Two times a day (BID) | INTRAMUSCULAR | Status: AC
  Administered 2015-08-09 (×2): 80 mg via INTRAVENOUS
  Filled 2015-08-09 (×2): qty 8

## 2015-08-09 MED ORDER — SPIRONOLACTONE 25 MG PO TABS
12.5000 mg | ORAL_TABLET | Freq: Every day | ORAL | Status: DC
  Administered 2015-08-09 – 2015-08-10 (×2): 12.5 mg via ORAL
  Filled 2015-08-09 (×2): qty 1

## 2015-08-09 MED ORDER — FUROSEMIDE 10 MG/ML IJ SOLN
80.0000 mg | Freq: Two times a day (BID) | INTRAMUSCULAR | Status: DC
Start: 2015-08-09 — End: 2015-08-09

## 2015-08-09 NOTE — Progress Notes (Signed)
Patient ID: Danielle Benitez, female   DOB: 01-26-1932, 81 y.o.   MRN: [id number]   SUBJECTIVE:  Feels "worlds better".  DCCV to NSR on 10/26.  Does not want to go home just to come right back.  Motivated to stay healthy at this time, asks about salt and fluid restrictions. EGD with tortuous esophagus.  Weight and creatinine down today with IV Lasix yesterday.   Scheduled Meds: . allopurinol  300 mg Oral Daily  . amiodarone  400 mg Oral BID  . apixaban  2.5 mg Oral BID  . atorvastatin  20 mg Oral q1800  . DULoxetine  20 mg Oral QHS  . furosemide  80 mg Intravenous BID  . insulin aspart  0-15 Units Subcutaneous TID WC  . insulin aspart  0-5 Units Subcutaneous QHS  . iron polysaccharides  150 mg Oral BID  . linagliptin  5 mg Oral Daily  . metoprolol succinate  25 mg Oral Daily  . montelukast  10 mg Oral QHS  . pantoprazole  40 mg Oral Daily  . potassium chloride  20 mEq Oral Daily  . sodium chloride  3 mL Intravenous Q12H  . spironolactone  12.5 mg Oral Daily   Continuous Infusions: . sodium chloride     PRN Meds:.acetaminophen, albuterol, ondansetron (ZOFRAN) IV, senna-docusate, sodium chloride, traZODone, triamcinolone    Filed Vitals:   08/08/15 2100 08/09/15 0036 08/09/15 0400 08/09/15 0745  BP: 124/59 94/52 104/51 109/53  Pulse: 70 70 73 70  Temp: 97.8 F (36.6 C) 98.6 F (37 C) 98.5 F (36.9 C) 97.8 F (36.6 C)  TempSrc: Oral Oral Oral Oral  Resp:    18  Height:      Weight:   161 lb 9.6 oz (73.301 kg)   SpO2: 97% 95% 95% 95%    Intake/Output Summary (Last 24 hours) at 08/09/15 0917 Last data filed at 08/09/15 0745  Gross per 24 hour  Intake    200 ml  Output   1950 ml  Net  -1750 ml    LABS: Basic Metabolic Panel:  Recent Labs  62/95/28 0252 08/09/15 0406  NA 139 142  K 3.9 4.4  CL 98* 97*  CO2 29 31  GLUCOSE 149* 101*  BUN 31* 24*  CREATININE 1.89* 1.71*  CALCIUM 9.0 9.7   Liver Function Tests: No results for input(s): AST, ALT, ALKPHOS,  BILITOT, PROT, ALBUMIN in the last 72 hours. No results for input(s): LIPASE, AMYLASE in the last 72 hours. CBC:  Recent Labs  08/08/15 0252 08/09/15 0406  WBC 5.8 5.7  HGB 9.1* 10.8*  HCT 30.6* 36.3  MCV 83.2 85.0  PLT 100* 131*   Cardiac Enzymes: No results for input(s): CKTOTAL, CKMB, CKMBINDEX, TROPONINI in the last 72 hours. BNP: Invalid input(s): POCBNP D-Dimer: No results for input(s): DDIMER in the last 72 hours. Hemoglobin A1C: No results for input(s): HGBA1C in the last 72 hours. Fasting Lipid Panel: No results for input(s): CHOL, HDL, LDLCALC, TRIG, CHOLHDL, LDLDIRECT in the last 72 hours. Thyroid Function Tests: No results for input(s): TSH, T4TOTAL, T3FREE, THYROIDAB in the last 72 hours.  Invalid input(s): FREET3 Anemia Panel: No results for input(s): VITAMINB12, FOLATE, FERRITIN, TIBC, IRON, RETICCTPCT in the last 72 hours.  RADIOLOGY: Dg Chest 2 View  08/04/2015  CLINICAL DATA:  Patient with history of atrial fibrillation. Chest pain and heaviness. EXAM: CHEST  2 VIEW COMPARISON:  Chest radiograph 07/30/2015 FINDINGS: Multi lead pacer apparatus overlies the left hemi thorax, leads are  stable in position. Stable cardiomegaly. Pulmonary vascular redistribution with mild interstitial pulmonary opacities. No large area of pulmonary consolidation. Mid thoracic spine degenerative changes. No pleural effusion pneumothorax. IMPRESSION: Cardiomegaly, pulmonary vascular redistribution and mild interstitial edema. Electronically Signed   By: Annia Belt M.D.   On: 08/04/2015 19:37   Dg Chest 2 View  07/30/2015  CLINICAL DATA:  Shortness of breath EXAM: CHEST  2 VIEW COMPARISON:  07/28/2015 chest radiograph FINDINGS: Do lead left subclavian pacemaker is stable in configuration with lead tips overlying the right atrium and right ventricle. Stable cardiomediastinal silhouette with mild-to-moderate cardiomegaly. Stable trace bilateral pleural effusions. No pneumothorax. Mild  pulmonary edema, which is worsened. No focal lung consolidation. IMPRESSION: 1. Mild congestive heart failure, worsened. 2. Stable trace bilateral pleural effusions. Electronically Signed   By: Delbert Phenix M.D.   On: 07/30/2015 11:53   Dg Chest 2 View  07/28/2015  CLINICAL DATA:  Patient admitted 1 week ago with shortness of breath. EXAM: CHEST  2 VIEW COMPARISON:  07/18/2015 FINDINGS: There is mild bilateral interstitial thickening. There is no focal parenchymal opacity. There are bilateral small pleural effusions. There is no pneumothorax. There is stable cardiomegaly. There is a dual lead cardiac pacemaker. There is severe osteoarthritis of the left glenohumeral joint. IMPRESSION: Stable cardiomegaly with mild pulmonary vascular congestion. Electronically Signed   By: Elige Ko   On: 07/28/2015 12:09   Dg Chest 2 View  07/18/2015  CLINICAL DATA:  Worsening shortness of breath EXAM: CHEST  2 VIEW COMPARISON:  07/11/2015 FINDINGS: There is mild bilateral interstitial thickening. There is no focal parenchymal opacity. There is no pleural effusion or pneumothorax. There is stable cardiomegaly. There is a dual lead cardiac pacemaker. There is severe osteoarthritis of the left glenohumeral joint. IMPRESSION: 1. Stable cardiomegaly with mild pulmonary vascular congestion. Electronically Signed   By: Elige Ko   On: 07/18/2015 11:08   Dg Chest 2 View  07/11/2015  CLINICAL DATA:  COPD. Bronchitis. Cough. Wheezing. Shortness of breath. EXAM: CHEST  2 VIEW COMPARISON:  01/11/2012 chest radiograph . FINDINGS: Stable configuration of dual lead left subclavian pacemaker with lead tips overlying the right atrium and right ventricle. Moderate enlargement of the cardiac silhouette, which appears increased in the interval. Otherwise stable mediastinal contour with mild tortuosity of the thoracic aorta. No pneumothorax. No pleural effusion. No focal lung consolidation. There is cephalization of the pulmonary  vasculature without overt pulmonary edema. Moderate degenerative changes in the visualized thoracolumbar spine. IMPRESSION: 1. Worsening moderate enlargement of the cardiac silhouette, cannot exclude pericardial effusion. 2. Cephalization of the pulmonary vasculature without overt pulmonary edema. Electronically Signed   By: Delbert Phenix M.D.   On: 07/11/2015 16:53   Dg Esophagus  08/05/2015  CLINICAL DATA:  Difficulty swallowing solids and liquids for months. EXAM: ESOPHOGRAM/BARIUM SWALLOW TECHNIQUE: Single contrast examination was performed using thin barium or water soluble. FLUOROSCOPY TIME:  Radiation Exposure Index (as provided by the fluoroscopic device): 7.8 mGy entrance does If the device does not provide the exposure index: Fluoroscopy Time:  1.2 minutes Number of Acquired Images:  None, only saved fluoroscopy COMPARISON:  None. FINDINGS: Single contrast single projection study performed due to patient's medical condition. Oblique pharyngeal imaging showed no aspiration or obstructive process. The thoracic esophagus has normal distensibility and course with no evidence of mass or stricture. A small hiatal hernia is present. Esophageal motility could be adequately assessed due to positioning, but primary propulsion wave was effective and there was no significant  stasis. No encountered gastroesophageal reflux. The 13 mm barium tablet successfully traversed the esophagus. IMPRESSION: No explanation for dysphagia. Electronically Signed   By: Marnee Spring M.D.   On: 08/05/2015 13:49   US Renal  07/21/2015  CLINICAL DATA:  Acute renal failure EXAM: RENAL / URINARY TRACT ULTRASOUND COMPLETE COMPARISON:  None. FINDINGS: Right Kidney: Length: 10.3 cm. Echogenicity within normal limits. No mass or hydronephrosis visualized. Left Kidney: Length: 10.6 cm. Mildly increased echotexture. No mass or hydronephrosis. Bladder: Appears normal for degree of bladder distention. IMPRESSION: No hydronephrosis or acute  findings. Mildly increased echotexture on the left. Electronically Signed   By: Charlett Nose M.D.   On: 07/21/2015 11:11    PHYSICAL EXAM General: NAD Neck: JVP 10 cm, no thyromegaly or lymphadenopathy noted.   Lungs: Mild crackles bibasilarly. CV: Nondisplaced PMI.  Heart regular rate and rhythm. Normal S1/S2, no S3/S4, 1/6 SEM RUSB.  No edema.  Abdomen: Soft, nontender, nondistended, no hepatosplenomegaly  Neurologic: Alert and oriented x 3.  Psych: Pleasant affect.  Extremities: No clubbing or cyanosis.   TELEMETRY: a-paced at 56  ASSESSMENT AND PLAN: 81 yo with history of HTN but also orthostatic hypotension, symptomatic bradycardia with Medtronic PPM, paroxysmal atrial fibrillation chronic systolic CHF, CKD stage III, and iron deficiency anemia presents to ER with worsening dyspnea and weight gain. This is her 3rd admission this month.   1. Acute on chronic systolic CHF: EF 16-10% by echo. Etiology uncertain: could be tachy-mediated cardiomyopathy, but symptoms seem to predate the onset of atrial fibrillation. Possible viral myocarditis. Cannot rule out ischemic cardiomyopathy, but no definite anginal symptoms. She diuresed well yesterday on IV Lasix and creatinine is down.  Still some volume overload on exam.  - Continue Lasix 80 mg IV bid today, likely to po tomorrow.  - Continue Toprol XL  daily. - Hold lisinopril with elevated creatinine. - Think we can add spironolactone 12.5 daily.  - Follow BMET closely.  2. Atrial fibrillation: Tolerates poorly.  A-paced s/p DCCV 08/06/15. - Continue Eliquis - Toprol XL 25 mg daily. 3. AKI on CKD Stage III: Creatinine 1.58 => 1.8 => 2.01 => 1.71.  Follow closely. 4. Medtronic PPM for symptomatic bradycardia.  She is a-pacing. 5. Recent UTI: Completed course of cipro.  6. Type II diabetes: Continue home med + sliding scale insulin.  7. Dysphagia: Suspect esophageal dysmotility after GI workup.   [name] 08/09/2015 9:17  AM

## 2015-08-10 LAB — BASIC METABOLIC PANEL
Anion gap: 11 (ref 5–15)
BUN: 24 mg/dL — AB (ref 6–20)
CHLORIDE: 90 mmol/L — AB (ref 101–111)
CO2: 32 mmol/L (ref 22–32)
Calcium: 9.1 mg/dL (ref 8.9–10.3)
Creatinine, Ser: 1.75 mg/dL — ABNORMAL HIGH (ref 0.44–1.00)
GFR calc Af Amer: 30 mL/min — ABNORMAL LOW (ref >60)
GFR calc non Af Amer: 26 mL/min — ABNORMAL LOW (ref >60)
GLUCOSE: 136 mg/dL — AB (ref 65–99)
POTASSIUM: 3.7 mmol/L (ref 3.5–5.1)
Sodium: 133 mmol/L — ABNORMAL LOW (ref 135–145)

## 2015-08-10 LAB — CBC
HEMATOCRIT: 36.6 % (ref 36.0–46.0)
Hemoglobin: 11.1 g/dL — ABNORMAL LOW (ref 12.0–15.0)
MCH: 25.3 pg — ABNORMAL LOW (ref 26.0–34.0)
MCHC: 30.3 g/dL (ref 30.0–36.0)
MCV: 83.4 fL (ref 78.0–100.0)
Platelets: 143 10*3/uL — ABNORMAL LOW (ref 150–400)
RBC: 4.39 MIL/uL (ref 3.87–5.11)
RDW: 22.8 % — AB (ref 11.5–15.5)
WBC: 5.9 10*3/uL (ref 4.0–10.5)

## 2015-08-10 LAB — BRAIN NATRIURETIC PEPTIDE: B Natriuretic Peptide: 1950.9 pg/mL — ABNORMAL HIGH (ref 0.0–100.0)

## 2015-08-10 LAB — GLUCOSE, CAPILLARY
GLUCOSE-CAPILLARY: 153 mg/dL — AB (ref 65–99)
GLUCOSE-CAPILLARY: 210 mg/dL — AB (ref 65–99)
Glucose-Capillary: 163 mg/dL — ABNORMAL HIGH (ref 65–99)
Glucose-Capillary: 192 mg/dL — ABNORMAL HIGH (ref 65–99)

## 2015-08-10 MED ORDER — AMIODARONE HCL IN DEXTROSE 360-4.14 MG/200ML-% IV SOLN
60.0000 mg/h | INTRAVENOUS | Status: AC
  Administered 2015-08-10: 60 mg/h via INTRAVENOUS
  Filled 2015-08-10 (×2): qty 200

## 2015-08-10 MED ORDER — AMIODARONE HCL IN DEXTROSE 360-4.14 MG/200ML-% IV SOLN
30.0000 mg/h | INTRAVENOUS | Status: DC
  Administered 2015-08-10: 30 mg/h via INTRAVENOUS
  Filled 2015-08-10: qty 200

## 2015-08-10 MED ORDER — AMIODARONE LOAD VIA INFUSION
150.0000 mg | Freq: Once | INTRAVENOUS | Status: AC
  Administered 2015-08-10: 150 mg via INTRAVENOUS
  Filled 2015-08-10: qty 83.34

## 2015-08-10 MED ORDER — LISINOPRIL 2.5 MG PO TABS
2.5000 mg | ORAL_TABLET | Freq: Every day | ORAL | Status: DC
  Administered 2015-08-10: 2.5 mg via ORAL
  Filled 2015-08-10: qty 1

## 2015-08-10 MED ORDER — FUROSEMIDE 40 MG PO TABS
40.0000 mg | ORAL_TABLET | Freq: Two times a day (BID) | ORAL | Status: DC
  Administered 2015-08-10 – 2015-08-11 (×3): 40 mg via ORAL
  Filled 2015-08-10 (×3): qty 1

## 2015-08-10 NOTE — Progress Notes (Signed)
Pt in afib, asymptomatic, new orders for amioderone IV started per orders. Raymon Mutton RN

## 2015-08-10 NOTE — Progress Notes (Signed)
Patient ID: Danielle Benitez, female   DOB: 1932-02-17, 81 y.o.   MRN: [id number]   SUBJECTIVE:  DCCV to NSR on 10/26.  She has diuresed well with weight down.  However, went back into atrial fibrillation last night and remains in atrial fibrillation this morning, rate is controlled. Not short of breath.   Scheduled Meds: . allopurinol  300 mg Oral Daily  . amiodarone  150 mg Intravenous Once  . apixaban  2.5 mg Oral BID  . atorvastatin  20 mg Oral q1800  . DULoxetine  20 mg Oral QHS  . furosemide  40 mg Oral BID  . insulin aspart  0-15 Units Subcutaneous TID WC  . insulin aspart  0-5 Units Subcutaneous QHS  . iron polysaccharides  150 mg Oral BID  . linagliptin  5 mg Oral Daily  . lisinopril  2.5 mg Oral Daily  . metoprolol succinate  25 mg Oral Daily  . montelukast  10 mg Oral QHS  . pantoprazole  40 mg Oral Daily  . potassium chloride  20 mEq Oral Daily  . sodium chloride  3 mL Intravenous Q12H  . spironolactone  12.5 mg Oral Daily   Continuous Infusions: . sodium chloride    . amiodarone     Followed by  . amiodarone     PRN Meds:.acetaminophen, albuterol, ondansetron (ZOFRAN) IV, senna-docusate, sodium chloride, traZODone, triamcinolone    Filed Vitals:   08/09/15 2122 08/10/15 0052 08/10/15 0427 08/10/15 0713  BP: 118/67 123/63 101/65 102/54  Pulse: 71 77 92 76  Temp: 97.5 F (36.4 C) 97.8 F (36.6 C) 98.2 F (36.8 C) 98.3 F (36.8 C)  TempSrc: Axillary Oral Oral Oral  Resp:    17  Height:      Weight:   154 lb 14.4 oz (70.262 kg)   SpO2: 98% 100% 98% 91%    Intake/Output Summary (Last 24 hours) at 08/10/15 0904 Last data filed at 08/10/15 0426  Gross per 24 hour  Intake    480 ml  Output   1050 ml  Net   -570 ml    LABS: Basic Metabolic Panel:  Recent Labs  56/21/30 0406 08/10/15 0517  NA 142 133*  K 4.4 3.7  CL 97* 90*  CO2 31 32  GLUCOSE 101* 136*  BUN 24* 24*  CREATININE 1.71* 1.75*  CALCIUM 9.7 9.1   Liver Function Tests: No results for  input(s): AST, ALT, ALKPHOS, BILITOT, PROT, ALBUMIN in the last 72 hours. No results for input(s): LIPASE, AMYLASE in the last 72 hours. CBC:  Recent Labs  08/09/15 0406 08/10/15 0517  WBC 5.7 5.9  HGB 10.8* 11.1*  HCT 36.3 36.6  MCV 85.0 83.4  PLT 131* 143*   Cardiac Enzymes: No results for input(s): CKTOTAL, CKMB, CKMBINDEX, TROPONINI in the last 72 hours. BNP: Invalid input(s): POCBNP D-Dimer: No results for input(s): DDIMER in the last 72 hours. Hemoglobin A1C: No results for input(s): HGBA1C in the last 72 hours. Fasting Lipid Panel: No results for input(s): CHOL, HDL, LDLCALC, TRIG, CHOLHDL, LDLDIRECT in the last 72 hours. Thyroid Function Tests: No results for input(s): TSH, T4TOTAL, T3FREE, THYROIDAB in the last 72 hours.  Invalid input(s): FREET3 Anemia Panel: No results for input(s): VITAMINB12, FOLATE, FERRITIN, TIBC, IRON, RETICCTPCT in the last 72 hours.  RADIOLOGY: Dg Chest 2 View  08/04/2015  CLINICAL DATA:  Patient with history of atrial fibrillation. Chest pain and heaviness. EXAM: CHEST  2 VIEW COMPARISON:  Chest radiograph 07/30/2015 FINDINGS: Multi  lead pacer apparatus overlies the left hemi thorax, leads are stable in position. Stable cardiomegaly. Pulmonary vascular redistribution with mild interstitial pulmonary opacities. No large area of pulmonary consolidation. Mid thoracic spine degenerative changes. No pleural effusion pneumothorax. IMPRESSION: Cardiomegaly, pulmonary vascular redistribution and mild interstitial edema. Electronically Signed   By: Annia Belt M.D.   On: 08/04/2015 19:37   Dg Chest 2 View  07/30/2015  CLINICAL DATA:  Shortness of breath EXAM: CHEST  2 VIEW COMPARISON:  07/28/2015 chest radiograph FINDINGS: Do lead left subclavian pacemaker is stable in configuration with lead tips overlying the right atrium and right ventricle. Stable cardiomediastinal silhouette with mild-to-moderate cardiomegaly. Stable trace bilateral pleural  effusions. No pneumothorax. Mild pulmonary edema, which is worsened. No focal lung consolidation. IMPRESSION: 1. Mild congestive heart failure, worsened. 2. Stable trace bilateral pleural effusions. Electronically Signed   By: Delbert Phenix M.D.   On: 07/30/2015 11:53   Dg Chest 2 View  07/28/2015  CLINICAL DATA:  Patient admitted 1 week ago with shortness of breath. EXAM: CHEST  2 VIEW COMPARISON:  07/18/2015 FINDINGS: There is mild bilateral interstitial thickening. There is no focal parenchymal opacity. There are bilateral small pleural effusions. There is no pneumothorax. There is stable cardiomegaly. There is a dual lead cardiac pacemaker. There is severe osteoarthritis of the left glenohumeral joint. IMPRESSION: Stable cardiomegaly with mild pulmonary vascular congestion. Electronically Signed   By: Elige Ko   On: 07/28/2015 12:09   Dg Chest 2 View  07/18/2015  CLINICAL DATA:  Worsening shortness of breath EXAM: CHEST  2 VIEW COMPARISON:  07/11/2015 FINDINGS: There is mild bilateral interstitial thickening. There is no focal parenchymal opacity. There is no pleural effusion or pneumothorax. There is stable cardiomegaly. There is a dual lead cardiac pacemaker. There is severe osteoarthritis of the left glenohumeral joint. IMPRESSION: 1. Stable cardiomegaly with mild pulmonary vascular congestion. Electronically Signed   By: Elige Ko   On: 07/18/2015 11:08   Dg Chest 2 View  07/11/2015  CLINICAL DATA:  COPD. Bronchitis. Cough. Wheezing. Shortness of breath. EXAM: CHEST  2 VIEW COMPARISON:  01/11/2012 chest radiograph . FINDINGS: Stable configuration of dual lead left subclavian pacemaker with lead tips overlying the right atrium and right ventricle. Moderate enlargement of the cardiac silhouette, which appears increased in the interval. Otherwise stable mediastinal contour with mild tortuosity of the thoracic aorta. No pneumothorax. No pleural effusion. No focal lung consolidation. There is  cephalization of the pulmonary vasculature without overt pulmonary edema. Moderate degenerative changes in the visualized thoracolumbar spine. IMPRESSION: 1. Worsening moderate enlargement of the cardiac silhouette, cannot exclude pericardial effusion. 2. Cephalization of the pulmonary vasculature without overt pulmonary edema. Electronically Signed   By: Delbert Phenix M.D.   On: 07/11/2015 16:53   Dg Esophagus  08/05/2015  CLINICAL DATA:  Difficulty swallowing solids and liquids for months. EXAM: ESOPHOGRAM/BARIUM SWALLOW TECHNIQUE: Single contrast examination was performed using thin barium or water soluble. FLUOROSCOPY TIME:  Radiation Exposure Index (as provided by the fluoroscopic device): 7.8 mGy entrance does If the device does not provide the exposure index: Fluoroscopy Time:  1.2 minutes Number of Acquired Images:  None, only saved fluoroscopy COMPARISON:  None. FINDINGS: Single contrast single projection study performed due to patient's medical condition. Oblique pharyngeal imaging showed no aspiration or obstructive process. The thoracic esophagus has normal distensibility and course with no evidence of mass or stricture. A small hiatal hernia is present. Esophageal motility could be adequately assessed due to positioning, but  primary propulsion wave was effective and there was no significant stasis. No encountered gastroesophageal reflux. The 13 mm barium tablet successfully traversed the esophagus. IMPRESSION: No explanation for dysphagia. Electronically Signed   By: Marnee Spring M.D.   On: 08/05/2015 13:49   US Renal  07/21/2015  CLINICAL DATA:  Acute renal failure EXAM: RENAL / URINARY TRACT ULTRASOUND COMPLETE COMPARISON:  None. FINDINGS: Right Kidney: Length: 10.3 cm. Echogenicity within normal limits. No mass or hydronephrosis visualized. Left Kidney: Length: 10.6 cm. Mildly increased echotexture. No mass or hydronephrosis. Bladder: Appears normal for degree of bladder distention.  IMPRESSION: No hydronephrosis or acute findings. Mildly increased echotexture on the left. Electronically Signed   By: Charlett Nose M.D.   On: 07/21/2015 11:11    PHYSICAL EXAM General: NAD Neck: JVP 7-8 cm, no thyromegaly or lymphadenopathy noted.   Lungs: Mild crackles bibasilarly. CV: Nondisplaced PMI.  Heart irregular rate and rhythm. Normal S1/S2, no S3/S4, 1/6 SEM RUSB.  No edema.  Abdomen: Soft, nontender, nondistended, no hepatosplenomegaly  Neurologic: Alert and oriented x 3.  Psych: Pleasant affect.  Extremities: No clubbing or cyanosis.   TELEMETRY: atrial fibrillation in 90s.  ASSESSMENT AND PLAN: 81 yo with history of HTN but also orthostatic hypotension, symptomatic bradycardia with Medtronic PPM, paroxysmal atrial fibrillation chronic systolic CHF, CKD stage III, and iron deficiency anemia presents to ER with worsening dyspnea and weight gain. This is her 3rd admission this month.   1. Acute on chronic systolic CHF: EF 40-98% by echo. Etiology uncertain: could be tachy-mediated cardiomyopathy, but symptoms seem to predate the onset of atrial fibrillation. Possible viral myocarditis. Cannot rule out ischemic cardiomyopathy, but no definite anginal symptoms. She diuresed well yesterday on IV Lasix and creatinine is stable.  Volume looks much better on exam.  - Convert to Lasix 40 mg bid.  - Continue Toprol XL  daily and spironolactone. - Add back low dose lisinopril 2.5 mg daily, follow creatinine.  2. Atrial fibrillation: Tolerates poorly.  A-paced s/p DCCV 08/06/15.  Unfortunately, back in atrial fibrillation this morning.  Rate in 90s.   - Continue Eliquis - Toprol XL 25 mg daily. - Reload amiodarone IV, if remains in atrial fibrillation tomorrow would repeat cardioversion.  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  3. AKI on CKD Stage III: Creatinine 1.58 => 1.8 => 2.01 => 1.71 =>  1.75.  Follow closely. 4. Medtronic PPM for symptomatic bradycardia. 5. Recent UTI: Completed course of cipro.  6. Type II diabetes: Continue home med + sliding scale insulin.  7. Dysphagia: Suspect esophageal dysmotility after GI workup.   [name] 08/10/2015 9:04 AM

## 2015-08-11 ENCOUNTER — Encounter (HOSPITAL_COMMUNITY): Payer: Self-pay | Admitting: Internal Medicine

## 2015-08-11 LAB — GLUCOSE, CAPILLARY
Glucose-Capillary: 158 mg/dL — ABNORMAL HIGH (ref 65–99)
Glucose-Capillary: 204 mg/dL — ABNORMAL HIGH (ref 65–99)

## 2015-08-11 LAB — BASIC METABOLIC PANEL
Anion gap: 11 (ref 5–15)
BUN: 29 mg/dL — ABNORMAL HIGH (ref 6–20)
CO2: 31 mmol/L (ref 22–32)
Calcium: 9.1 mg/dL (ref 8.9–10.3)
Chloride: 95 mmol/L — ABNORMAL LOW (ref 101–111)
Creatinine, Ser: 2.08 mg/dL — ABNORMAL HIGH (ref 0.44–1.00)
GFR calc Af Amer: 25 mL/min — ABNORMAL LOW (ref >60)
GFR, EST NON AFRICAN AMERICAN: 21 mL/min — AB (ref >60)
GLUCOSE: 148 mg/dL — AB (ref 65–99)
POTASSIUM: 3.9 mmol/L (ref 3.5–5.1)
Sodium: 137 mmol/L (ref 135–145)

## 2015-08-11 MED ORDER — ATORVASTATIN CALCIUM 20 MG PO TABS: 20.0000 mg | ORAL_TABLET | Freq: Every day | ORAL | Status: DC

## 2015-08-11 MED ORDER — FUROSEMIDE 40 MG PO TABS: 40.0000 mg | ORAL_TABLET | Freq: Two times a day (BID) | ORAL | Status: DC

## 2015-08-11 MED ORDER — POTASSIUM CHLORIDE CRYS ER 20 MEQ PO TBCR: 20.0000 meq | EXTENDED_RELEASE_TABLET | Freq: Every day | ORAL | Status: DC

## 2015-08-11 MED ORDER — AMIODARONE HCL 200 MG PO TABS: ORAL_TABLET | ORAL | Status: DC

## 2015-08-11 MED ORDER — SPIRONOLACTONE 25 MG PO TABS
25.0000 mg | ORAL_TABLET | Freq: Every day | ORAL | Status: DC
  Administered 2015-08-11: 25 mg via ORAL
  Filled 2015-08-11: qty 1

## 2015-08-11 MED ORDER — AMIODARONE HCL 200 MG PO TABS
400.0000 mg | ORAL_TABLET | Freq: Two times a day (BID) | ORAL | Status: DC
  Administered 2015-08-11: 400 mg via ORAL
  Filled 2015-08-11: qty 2

## 2015-08-11 MED ORDER — TRAZODONE HCL 50 MG PO TABS: 50.0000 mg | ORAL_TABLET | Freq: Every evening | ORAL | Status: DC | PRN

## 2015-08-11 MED ORDER — METOPROLOL SUCCINATE ER 25 MG PO TB24: 25.0000 mg | ORAL_TABLET | Freq: Every day | ORAL | Status: DC

## 2015-08-11 MED ORDER — SPIRONOLACTONE 25 MG PO TABS: 25.0000 mg | ORAL_TABLET | Freq: Every day | ORAL | Status: DC

## 2015-08-11 NOTE — Progress Notes (Signed)
Patient ID: Danielle Benitez, female   DOB: 04/11/1932, 81 y.o.   MRN: [id number]   SUBJECTIVE:  DCCV to NSR on 10/26.  She has diuresed well with weight down.  However, went back into atrial fibrillation on 10/29.  She went back into NSR again last night. Now a-paced. Not short of breath.   Scheduled Meds: . allopurinol  300 mg Oral Daily  . amiodarone  400 mg Oral BID  . apixaban  2.5 mg Oral BID  . atorvastatin  20 mg Oral q1800  . DULoxetine  20 mg Oral QHS  . furosemide  40 mg Oral BID  . insulin aspart  0-15 Units Subcutaneous TID WC  . insulin aspart  0-5 Units Subcutaneous QHS  . iron polysaccharides  150 mg Oral BID  . linagliptin  5 mg Oral Daily  . metoprolol succinate  25 mg Oral Daily  . montelukast  10 mg Oral QHS  . pantoprazole  40 mg Oral Daily  . potassium chloride  20 mEq Oral Daily  . sodium chloride  3 mL Intravenous Q12H  . spironolactone  12.5 mg Oral Daily   Continuous Infusions: . sodium chloride     PRN Meds:.acetaminophen, albuterol, ondansetron (ZOFRAN) IV, senna-docusate, sodium chloride, traZODone, triamcinolone    Filed Vitals:   08/11/15 0009 08/11/15 0100 08/11/15 0448 08/11/15 0755  BP: 80/37 112/58 100/52 105/64  Pulse: 70  66 70  Temp: 98.1 F (36.7 C)  97.9 F (36.6 C) 98 F (36.7 C)  TempSrc: Oral  Oral Oral  Resp:    18  Height:      Weight:      SpO2: 93%  95% 94%    Intake/Output Summary (Last 24 hours) at 08/11/15 0802 Last data filed at 08/10/15 1806  Gross per 24 hour  Intake    360 ml  Output      0 ml  Net    360 ml    LABS: Basic Metabolic Panel:  Recent Labs  40/98/11 0517 08/11/15 0332  NA 133* 137  K 3.7 3.9  CL 90* 95*  CO2 32 31  GLUCOSE 136* 148*  BUN 24* 29*  CREATININE 1.75* 2.08*  CALCIUM 9.1 9.1   Liver Function Tests: No results for input(s): AST, ALT, ALKPHOS, BILITOT, PROT, ALBUMIN in the last 72 hours. No results for input(s): LIPASE, AMYLASE in the last 72 hours. CBC:  Recent Labs  08/09/15 0406 08/10/15 0517  WBC 5.7 5.9  HGB 10.8* 11.1*  HCT 36.3 36.6  MCV 85.0 83.4  PLT 131* 143*   Cardiac Enzymes: No results for input(s): CKTOTAL, CKMB, CKMBINDEX, TROPONINI in the last 72 hours. BNP: Invalid input(s): POCBNP D-Dimer: No results for input(s): DDIMER in the last 72 hours. Hemoglobin A1C: No results for input(s): HGBA1C in the last 72 hours. Fasting Lipid Panel: No results for input(s): CHOL, HDL, LDLCALC, TRIG, CHOLHDL, LDLDIRECT in the last 72 hours. Thyroid Function Tests: No results for input(s): TSH, T4TOTAL, T3FREE, THYROIDAB in the last 72 hours.  Invalid input(s): FREET3 Anemia Panel: No results for input(s): VITAMINB12, FOLATE, FERRITIN, TIBC, IRON, RETICCTPCT in the last 72 hours.  RADIOLOGY: Dg Chest 2 View  08/04/2015  CLINICAL DATA:  Patient with history of atrial fibrillation. Chest pain and heaviness. EXAM: CHEST  2 VIEW COMPARISON:  Chest radiograph 07/30/2015 FINDINGS: Multi lead pacer apparatus overlies the left hemi thorax, leads are stable in position. Stable cardiomegaly. Pulmonary vascular redistribution with mild interstitial pulmonary opacities. No large area  of pulmonary consolidation. Mid thoracic spine degenerative changes. No pleural effusion pneumothorax. IMPRESSION: Cardiomegaly, pulmonary vascular redistribution and mild interstitial edema. Electronically Signed   By: Annia Belt M.D.   On: 08/04/2015 19:37   Dg Chest 2 View  07/30/2015  CLINICAL DATA:  Shortness of breath EXAM: CHEST  2 VIEW COMPARISON:  07/28/2015 chest radiograph FINDINGS: Do lead left subclavian pacemaker is stable in configuration with lead tips overlying the right atrium and right ventricle. Stable cardiomediastinal silhouette with mild-to-moderate cardiomegaly. Stable trace bilateral pleural effusions. No pneumothorax. Mild pulmonary edema, which is worsened. No focal lung consolidation. IMPRESSION: 1. Mild congestive heart failure, worsened. 2. Stable  trace bilateral pleural effusions. Electronically Signed   By: Delbert Phenix M.D.   On: 07/30/2015 11:53   Dg Chest 2 View  07/28/2015  CLINICAL DATA:  Patient admitted 1 week ago with shortness of breath. EXAM: CHEST  2 VIEW COMPARISON:  07/18/2015 FINDINGS: There is mild bilateral interstitial thickening. There is no focal parenchymal opacity. There are bilateral small pleural effusions. There is no pneumothorax. There is stable cardiomegaly. There is a dual lead cardiac pacemaker. There is severe osteoarthritis of the left glenohumeral joint. IMPRESSION: Stable cardiomegaly with mild pulmonary vascular congestion. Electronically Signed   By: Elige Ko   On: 07/28/2015 12:09   Dg Chest 2 View  07/18/2015  CLINICAL DATA:  Worsening shortness of breath EXAM: CHEST  2 VIEW COMPARISON:  07/11/2015 FINDINGS: There is mild bilateral interstitial thickening. There is no focal parenchymal opacity. There is no pleural effusion or pneumothorax. There is stable cardiomegaly. There is a dual lead cardiac pacemaker. There is severe osteoarthritis of the left glenohumeral joint. IMPRESSION: 1. Stable cardiomegaly with mild pulmonary vascular congestion. Electronically Signed   By: Elige Ko   On: 07/18/2015 11:08   Dg Esophagus  08/05/2015  CLINICAL DATA:  Difficulty swallowing solids and liquids for months. EXAM: ESOPHOGRAM/BARIUM SWALLOW TECHNIQUE: Single contrast examination was performed using thin barium or water soluble. FLUOROSCOPY TIME:  Radiation Exposure Index (as provided by the fluoroscopic device): 7.8 mGy entrance does If the device does not provide the exposure index: Fluoroscopy Time:  1.2 minutes Number of Acquired Images:  None, only saved fluoroscopy COMPARISON:  None. FINDINGS: Single contrast single projection study performed due to patient's medical condition. Oblique pharyngeal imaging showed no aspiration or obstructive process. The thoracic esophagus has normal distensibility and course  with no evidence of mass or stricture. A small hiatal hernia is present. Esophageal motility could be adequately assessed due to positioning, but primary propulsion wave was effective and there was no significant stasis. No encountered gastroesophageal reflux. The 13 mm barium tablet successfully traversed the esophagus. IMPRESSION: No explanation for dysphagia. Electronically Signed   By: Marnee Spring M.D.   On: 08/05/2015 13:49   US Renal  07/21/2015  CLINICAL DATA:  Acute renal failure EXAM: RENAL / URINARY TRACT ULTRASOUND COMPLETE COMPARISON:  None. FINDINGS: Right Kidney: Length: 10.3 cm. Echogenicity within normal limits. No mass or hydronephrosis visualized. Left Kidney: Length: 10.6 cm. Mildly increased echotexture. No mass or hydronephrosis. Bladder: Appears normal for degree of bladder distention. IMPRESSION: No hydronephrosis or acute findings. Mildly increased echotexture on the left. Electronically Signed   By: Charlett Nose M.D.   On: 07/21/2015 11:11    PHYSICAL EXAM General: NAD Neck: JVP 7 cm, no thyromegaly or lymphadenopathy noted.   Lungs: Mild crackles bibasilarly. CV: Nondisplaced PMI.  Heart regular rate and rhythm. Normal S1/S2, no S3/S4, 1/6  SEM RUSB.  No edema.  Abdomen: Soft, nontender, nondistended, no hepatosplenomegaly  Neurologic: Alert and oriented x 3.  Psych: Pleasant affect.  Extremities: No clubbing or cyanosis.   TELEMETRY: a-paced, v-sensed.  ASSESSMENT AND PLAN: 81 yo with history of HTN but also orthostatic hypotension, symptomatic bradycardia with Medtronic PPM, paroxysmal atrial fibrillation chronic systolic CHF, CKD stage III, and iron deficiency anemia presents to ER with worsening dyspnea and weight gain. This is her 3rd admission this month.   1. Acute on chronic systolic CHF: EF 27-51% by echo. Etiology uncertain: could be tachy-mediated cardiomyopathy, but symptoms seem to predate the onset of atrial fibrillation. Possible viral  myocarditis. Cannot rule out ischemic cardiomyopathy, but no definite anginal symptoms. She diuresed well on IV Lasix.  Volume looks much better on exam.  - Continue Lasix 40 mg po bid.   - Continue Toprol XL 25mg  daily and can increase spironolactone to 25 mg daily. - Creatinine is up some, will stop lisinopril. 2. Atrial fibrillation: Tolerates poorly.  A-paced s/p DCCV 08/06/15.  Converted to afib over weekend but back in NSR today. - Continue Eliquis - Toprol XL 25 mg daily. - Stop IV amiodarone, convert to po.  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  3. AKI on CKD Stage III: Creatinine 1.58 => 1.8 => 2.01 => 1.71 => 1.75 => 2.  Stop lisinopril. 4. Medtronic PPM for symptomatic bradycardia. 5. Recent UTI: Completed course of cipro.  6. Type II diabetes: Continue home med + sliding scale insulin.  7. Dysphagia: Suspect esophageal dysmotility after GI workup.  8. Disposition: Home today.  Needs followup in 1 week in CHF clinic with me.  Meds: Eliquis 2.5 mg bid, Lasix 40 mg po bid, spironolactone 25 mg daily, Toprol XL 25 mg daily, atorvastatin 20 daily, Protonix 40 daily, amiodarone 400 mg bid x 1 week then 200 mg bid x 1 week then 200 mg daily.    [name] 08/11/2015 8:02 AM

## 2015-08-11 NOTE — Consult Note (Signed)
   [facility] CM Inpatient Consult   08/11/2015  Danielle Benitez [birth year] [id number] Patient is currently active with [facility] Care Management for chronic disease management services.  Patient has been engaged by a [employer] and community nurse is aware of the patient's hospitalization.    Our community based plan of care has focused on disease management and community resource support.  Patient will receive a post discharge transition of care call and will be evaluated for monthly home visits for assessments and disease process education.  Made Inpatient Case Manager aware that [facility] Care Management following. Of note, [facility] Care Management services does not replace or interfere with any services that are arranged by inpatient case management or social work.  For additional questions or referrals please contact: Charlesetta Shanks, RN BSN CCM [facility]  [phone] business mobile phone

## 2015-08-11 NOTE — Progress Notes (Signed)
CARDIAC REHAB PHASE I   PRE:  Rate/Rhythm: 70 atrial paced   BP:  Supine: 98/60  Sitting:   Standing:    SaO2: 98%RA  MODE:  Ambulation: 250 ft   POST:  Rate/Rhythm: 80 paced  BP:  Supine:   Sitting: 108/66  Standing:    SaO2: 97%RA 0905-1000 Pt walked 250 ft on RA with rolling walker and minimal asst. Stopped once to rest. Has walker at home as needed. Gave pt CHF booklet and reviewed zones. Discussed importance of daily weights and when to call MD with weight gain. Discussed importance of low sodium diet and fluid restriction. Gave handouts. Encouraged 2000 mg. Daughter in room present for ed also. Gave OFF the Beat booklet and discussed importance of eliquis with atrial fib. Encouraged pt to walk as tolerated going a little farther each day with asst.    Luetta Nutting, RN BSN  08/11/2015 9:54 AM

## 2015-08-11 NOTE — Discharge Summary (Signed)
Advanced Heart Failure Team  Discharge Summary   Patient ID: Danielle Benitez MRN: [id number], DOB/AGE: 01-06-1932 81 y.o. Admit date: 08/04/2015 D/C date:     08/11/2015   Primary Discharge Diagnoses:  1. Acute on chronic systolic CHF: EF 40-98% by echo. - ? tachy-mediated cardiomyopathy, but predates onset of a-fib. ? viral myocarditis. Cannot r/o ICM, but no definite sxs.  2. Atrial fibrillation: - A-paced s/p DCCV 08/06/15. Will go home on po amiodorone. Consider for ablation eval if goes back into afib. 3. AKI on CKD Stage III: Creatinine 1.58 => 1.8 => 2.01 => 1.71 => 1.75 => 2. Stop lisinopril. 4. Symptomatic bradycardia s/p Medtronic PPM 5. UTI: Completed course of cipro.  6. Type II diabetes - Stable 7. Dysphagia: Seen by GI this admission. Suspect esophageal dysmotility  Consults: GI  Hospital Course:  Danielle Benitez is a 81 y.o. with hx of HTN but also orthostatic hypotension, symptomatic bradycardia s/p Medtronic PPM, PAF on Eliquis chronic systolic CHF, CKD stage III, and iron deficiency anemia who was readmitted with recurrent Afib s/p TEE-guided DCCV to NSR on 07/25/15. She was readmitted 10/19-10/23 with recurrent atrial fibrillation and acute on chronic systolic CHF. Rate control was pursued but pt had worsening DOE and orthopnea and returned to hospital on 08/04/15. With poor toleration of A fib, repeat DCCV was scheduled for 08/06/15.  Cardioversion was initially successful and she remained out of afib on amiodarone gtt. ACE and Toprol cut back due to hypotension.  Pt also complained of dysphagia. GI consulted on 08/05/15. Barium swallow 08/05/15 without explanation for dysphagia. EGD 08/08/15 which showed tortuous esophagus (c/w dysmotility) and small hiatal hernia. She was given diet recommendations and told to follow up with GI as needed.  Pt was noted to go back into a-fib the evening of 08/09/15. IV amio reloaded and pt returned to Afib overnight into morning of 08/11/15.  She remained in NSR up to discharged and will go home on a tapering dose of Amiodarone as below. Creatinine bumped to 2.0 but improved with holding of IV lasix. Cr improved, but lisinopril held until further follow up.   Overall she diuresed 2.5 liters and lost was down 7 lbs overall. She will be discharged to home in stable condition with close follow up in the HF clinic as below.  Home Health PT will be resumed.   Discharge Weight Range: 154 lb 14 oz Discharge Vitals: Blood pressure 98/52, pulse 68, temperature 98.2 F (36.8 C), temperature source Oral, resp. rate 16, height  (1.575 m), weight 154 lb 14.4 oz (70.262 kg), SpO2 98 %.  Labs: Lab Results  Component Value Date   WBC 5.9 08/10/2015   HGB 11.1* 08/10/2015   HCT 36.6 08/10/2015   MCV 83.4 08/10/2015   PLT 143* 08/10/2015     Recent Labs Lab 08/11/15 0332  NA 137  K 3.9  CL 95*  CO2 31  BUN 29*  CREATININE 2.08*  CALCIUM 9.1  GLUCOSE 148*   Lab Results  Component Value Date   CHOL 115 04/28/2015   HDL 55 04/28/2015   LDLCALC 48 04/28/2015   TRIG 61 04/28/2015   BNP (last 3 results)  Recent Labs  07/30/15 1109 08/04/15 2013 08/10/15 0517  BNP 3279.3* 3168.4* 1950.9*    ProBNP (last 3 results) No results for input(s): PROBNP in the last 8760 hours.   Diagnostic Studies/Procedures   No results found.  Discharge Medications     Medication List  STOP taking these medications        ciprofloxacin 500 MG tablet  Commonly known as:  CIPRO     Influenza vac split quadrivalent PF 0.5 ML injection  Commonly known as:  FLUARIX     lisinopril 20 MG tablet  Commonly known as:  PRINIVIL,ZESTRIL     metoprolol tartrate 25 MG tablet  Commonly known as:  LOPRESSOR      TAKE these medications        acetaminophen 325 MG tablet  Commonly known as:  TYLENOL  Take 2 tablets (650 mg total) by mouth every 4 (four) hours as needed for headache or mild pain.     albuterol 108 (90 BASE) MCG/ACT  inhaler  Commonly known as:  PROVENTIL HFA;VENTOLIN HFA  Inhale 2 puffs into the lungs every 6 (six) hours as needed for wheezing or shortness of breath.     allopurinol 300 MG tablet  Commonly known as:  ZYLOPRIM  Take 1 tablet (300 mg total) by mouth daily.     amiodarone 200 MG tablet  Commonly known as:  PACERONE  Take 2tablets (  total) twice daily x 1week. Then take 1tablet (  total) twice daily x 1week. Then take 1 tablet (  total) daily     apixaban 2.5 MG Tabs tablet  Commonly known as:  ELIQUIS  Take 1 tablet (2.5 mg total) by mouth 2 (two) times daily.     atorvastatin 20 MG tablet  Commonly known as:  LIPITOR  Take 1 tablet (20 mg total) by mouth daily at 6 PM.     DULoxetine 20 MG capsule  Commonly known as:  CYMBALTA  Take 1 capsule (20 mg total) by mouth at bedtime. Take 1 tablet by mouth daily to help treat anxiety and nerves.     furosemide 40 MG tablet  Commonly known as:  LASIX  Take 1 tablet (40 mg total) by mouth 2 (two) times daily.     iron polysaccharides 150 MG capsule  Commonly known as:  NU-IRON  Take 1 capsule (150 mg total) by mouth 2 (two) times daily.     metoprolol succinate 25 MG 24 hr tablet  Commonly known as:  TOPROL-XL  Take 1 tablet (25 mg total) by mouth daily.     montelukast 10 MG tablet  Commonly known as:  SINGULAIR  Take 1 tablet (10 mg total) by mouth at bedtime.     pantoprazole 40 MG tablet  Commonly known as:  PROTONIX  Take 1 tablet (40 mg total) by mouth daily.     saxagliptin HCl 2.5 MG Tabs tablet  Commonly known as:  ONGLYZA  Take 1 tablet (2.5 mg total) by mouth daily. Take one tablet by mouth once daily for blood sugar     senna-docusate 8.6-50 MG tablet  Commonly known as:  Senokot-S  Take 1 tablet by mouth daily as needed for mild constipation.     spironolactone 25 MG tablet  Commonly known as:  ALDACTONE  Take 1 tablet (25 mg total) by mouth daily.     traZODone 50 MG tablet  Commonly known  as:  DESYREL  Take 1 tablet (50 mg total) by mouth at bedtime as needed for sleep.     triamcinolone 0.025 % cream  Commonly known as:  KENALOG  Apply 1 application topically 4 (four) times daily as needed (itching).        Disposition   The patient will be discharged in stable condition to home. Resume  Home Health PT. Discharge Instructions    Contraindication to ACEI at discharge    Complete by:  As directed      Diet - low sodium heart healthy    Complete by:  As directed      Heart Failure patients record your daily weight using the same scale at the same time of day    Complete by:  As directed      Increase activity slowly    Complete by:  As directed           Follow-up Information    Follow up with [name], MD On 08/21/2015.   Specialty:  Cardiology   Why:  at 0930 for post hospital follow up.  Please bring all of your medications to your visit.  Pt code for parking is 9000.   Contact information:   [address] [place] [address] [phone]       Follow up with GREEN, Lenon Curt, MD.   Specialty:  Internal Medicine   Contact information:   [address] [place] [address] [phone]         Duration of Discharge Encounter: Greater than 35 minutes   Signed, Graciella Freer PA-C 08/11/2015, 12:10 PM

## 2015-08-11 NOTE — Progress Notes (Signed)
   08/11/15 1000  Clinical Encounter Type  Visited With Patient;Patient and family together;Health care provider Personal assistant; Witnesses)  Visit Type Follow-up;Other (Comment) (Adv. Directive)  Referral From Patient  Consult/Referral To Chaplain  Spiritual Encounters  Spiritual Needs Emotional (Adv Directive)  [place] met with pt and family; [place] brought notary and witness to complete Adv. Directive and requested that RN make copies and place in pt chart. Gwynn Burly 10:05 AM

## 2015-08-11 NOTE — Discharge Instructions (Signed)

## 2015-08-12 ENCOUNTER — Ambulatory Visit: Payer: Medicare Other | Admitting: Internal Medicine

## 2015-08-12 ENCOUNTER — Other Ambulatory Visit: Payer: Self-pay | Admitting: *Deleted

## 2015-08-12 DIAGNOSIS — I083 Combined rheumatic disorders of mitral, aortic and tricuspid valves: Secondary | ICD-10-CM | POA: Diagnosis not present

## 2015-08-12 DIAGNOSIS — E1122 Type 2 diabetes mellitus with diabetic chronic kidney disease: Secondary | ICD-10-CM | POA: Diagnosis not present

## 2015-08-12 DIAGNOSIS — N183 Chronic kidney disease, stage 3 (moderate): Secondary | ICD-10-CM | POA: Diagnosis not present

## 2015-08-12 DIAGNOSIS — E1140 Type 2 diabetes mellitus with diabetic neuropathy, unspecified: Secondary | ICD-10-CM | POA: Diagnosis not present

## 2015-08-12 DIAGNOSIS — Z950 Presence of cardiac pacemaker: Secondary | ICD-10-CM | POA: Diagnosis not present

## 2015-08-12 DIAGNOSIS — D649 Anemia, unspecified: Secondary | ICD-10-CM | POA: Diagnosis not present

## 2015-08-12 DIAGNOSIS — I129 Hypertensive chronic kidney disease with stage 1 through stage 4 chronic kidney disease, or unspecified chronic kidney disease: Secondary | ICD-10-CM | POA: Diagnosis not present

## 2015-08-12 DIAGNOSIS — I5023 Acute on chronic systolic (congestive) heart failure: Secondary | ICD-10-CM | POA: Diagnosis not present

## 2015-08-12 DIAGNOSIS — I4891 Unspecified atrial fibrillation: Secondary | ICD-10-CM | POA: Diagnosis not present

## 2015-08-12 DIAGNOSIS — I2510 Atherosclerotic heart disease of native coronary artery without angina pectoris: Secondary | ICD-10-CM | POA: Diagnosis not present

## 2015-08-12 NOTE — Patient Outreach (Signed)
First attempt made to contact pt for transition of care, discharged 10/31.  Unable to leave a voice message as message on pt's phone- voice message not established.   Plan to try again.      Shayne Alken.   Pierzchala RN CCM [facility] Care Management  [phone]

## 2015-08-13 ENCOUNTER — Other Ambulatory Visit: Payer: Self-pay | Admitting: *Deleted

## 2015-08-13 NOTE — Patient Outreach (Signed)
Attempt made to contact pt's daughter Colin Mulders (on [facility] consent form)as part of transition of care for pt as unable to contact pt with home numbers provided.   HIPPA compliant voice message left with contact number.  If no response, will try again tomorrow.     Shayne Alken.   Delmont Prosch RN CCM [facility] Care Management  [phone]

## 2015-08-13 NOTE — Patient Outreach (Addendum)
Second attempt made to contact  pt as part of transition of care, number called- [phone] and  phone kept ringing- unable to leave a voice message.  Plan to call pt's daughter Lonzo Cloud  on [facility] consent form.      Shayne Alken.   Sabryn Preslar RN CCM [facility] Care Management  [phone]

## 2015-08-13 NOTE — Progress Notes (Signed)
Cardiology Office Note   Date:  08/13/2015   ID:  Danielle Benitez, DOB 22-Dec-1931, MRN [id number]  PCP:  Kimber Relic, MD  Cardiologist:  Dr. Olga Millers   Electrophysiologist:  Dr. Lewayne Bunting   Chief Complaint  Patient presents with  . Hospitalization Follow-up  . Atrial Fibrillation  . Congestive Heart Failure     History of Present Illness: Danielle Benitez is a 81 y.o. female with a hx of    Studies/Reports Reviewed Today:  Echo 07/17/15 - Left ventricle: The cavity size was moderately dilated. Systolic function was moderately reduced. The estimated ejection fraction was in the range of 35% to 40%. Wall motion was normal; there were no regional wall motion abnormalities. - Aortic valve: Trileaflet; normal thickness, severely calcified leaflets. Valve mobility was restricted. There was mild stenosis. Peak velocity (S): 179.09 cm/s. Mean velocity (S): 126.36 cm/s. Mean gradient (S): 7 mm Hg. VTI ratio of LVOT to aortic valve: 0.37. Valve area (VTI): 1.16 cm^2. Valve area (Vmean): 1.02 cm^2. - Mitral valve: Calcified annulus. There was moderate regurgitation directed centrally. - Left atrium: The atrium was severely dilated. - Tricuspid valve: There was moderate regurgitation. - Pulmonary arteries: PA peak pressure: 48 mm Hg (S). - Pericardium, extracardiac: A small pericardial effusion was identified posterior to the heart. Features were not consistent with tamponade physiology.   LHC 4/13 LM: ok LAD:  irregs LCx:  Ok RCA: prox 20-30% EF 65%   Past Medical History  Diagnosis Date  . Gout   . Osteoarthritis   . Bursitis   . S/P cardiac pacemaker procedure, PPM [employer] REVO placed 01/11/2012 01/11/2012    a. for symptomatic bradycardia. Followed by Dr. Ladona Ridgel.  . Personal history of fall   . Chronic kidney disease, stage II (mild)   . Memory loss   . Anxiety state, unspecified   . Other and unspecified hyperlipidemia   . Anemia,  unspecified   . Abnormality of gait   . Depressive disorder, not elsewhere classified   . Rheumatism, unspecified and fibrositis   . Lumbago   . Nonspecific abnormal results of liver function study   . Obesity, unspecified   . Internal hemorrhoids without mention of complication   . Viremia, unspecified   . Osteoarthrosis, unspecified whether generalized or localized, unspecified site   . Abnormality of gait   . Viremia, unspecified   . Pain in joint, shoulder region 11/22/2013    leeft   . Pruritus 11/22/2013  . Eczema 11/22/2013  . Full dentures   . HOH (hard of hearing)   . Wears glasses   . Female stress incontinence 05/06/2015  . DM (diabetes mellitus), type 2, uncontrolled, with renal complications (HCC) 01/08/2012  . CKD (chronic kidney disease), stage III   . Aortic stenosis     a. Mild by echo 08/2013.  . Essential hypertension   . Orthostatic hypotension   . Bifascicular block   . Chronic systolic CHF (congestive heart failure) (HCC)     a. Dx 07/2015 - EF 35-40%, unclear etiology  . Mild CAD     a. Cath 2013: 20-30% prox RCA.  Marland Kitchen Anticoagulated- Eliquis 07/31/2015  . Atrial fibrillation - developed 07/11/15 by PPM interrogation, recurrent after DCCV 10/14 07/11/2015     recurrent after DCCV 10/14  . Presence of permanent cardiac pacemaker   . Esophageal dysmotility 08/05/2015    Past Surgical History  Procedure Laterality Date  . Extremity cyst excision    . Cardiac  catheterization  12/03/2003    Dr Clarene Duke  . Colonoscopy      ?????  . Orif wrist fracture Right 04/09/2014    Procedure: OPEN REDUCTION INTERNAL FIXATION (ORIF) RIGHT DISPLACED  DISTAL RADIUS  FRACTURE;  Surgeon: Dominica Severin, MD;  Location: [facility];  Service: Orthopedics;  Laterality: Right;  . Left heart catheterization with coronary angiogram N/A 01/10/2012    Procedure: LEFT HEART CATHETERIZATION WITH CORONARY ANGIOGRAM;  Surgeon: Chrystie Nose, MD;  Location: [facility] CATH LAB;   Service: Cardiovascular;  Laterality: N/A;  . Permanent pacemaker insertion Left 01/11/2012    Procedure: PERMANENT PACEMAKER INSERTION;  Surgeon: Thurmon Fair, MD;  Location: [facility] CATH LAB;  Service: Cardiovascular;  Laterality: Left;  . Tee without cardioversion N/A 07/25/2015    Procedure: TRANSESOPHAGEAL ECHOCARDIOGRAM (TEE);  Surgeon: Pricilla Riffle, MD;  Location: [facility] ENDOSCOPY;  Service: Cardiovascular;  Laterality: N/A;  . Cardioversion N/A 07/25/2015    Procedure: CARDIOVERSION;  Surgeon: Pricilla Riffle, MD;  Location: [facility] ENDOSCOPY;  Service: Cardiovascular;  Laterality: N/A;  . Cataract extraction w/ intraocular lens  implant, bilateral Bilateral 03/2015-04/2015  . Cardioversion N/A 08/06/2015    Procedure: CARDIOVERSION;  Surgeon: Laurey Morale, MD;  Location: [facility] ENDOSCOPY;  Service: Cardiovascular;  Laterality: N/A;  . Esophagogastroduodenoscopy N/A 08/08/2015    Procedure: ESOPHAGOGASTRODUODENOSCOPY (EGD);  Surgeon: Iva Boop, MD;  Location: [facility] ENDOSCOPY;  Service: Endoscopy;  Laterality: N/A;     Current Outpatient Prescriptions  Medication Sig Dispense Refill  . acetaminophen (TYLENOL) 325 MG tablet Take 2 tablets (650 mg total) by mouth every 4 (four) hours as needed for headache or mild pain.    Marland Kitchen albuterol (PROVENTIL HFA;VENTOLIN HFA) 108 (90 BASE) MCG/ACT inhaler Inhale 2 puffs into the lungs every 6 (six) hours as needed for wheezing or shortness of breath. 1 Inhaler 0  . allopurinol (ZYLOPRIM) 300 MG tablet Take 1 tablet (300 mg total) by mouth daily. 90 tablet 3  . amiodarone (PACERONE) 200 MG tablet Take 2tablets (400mg  total) twice daily x 1week. Then take 1tablet (200mg  total) twice daily x 1week. Then take 1 tablet (200mg  total) daily 65 tablet 1  . apixaban (ELIQUIS) 2.5 MG TABS tablet Take 1 tablet (2.5 mg total) by mouth 2 (two) times daily. 60 tablet 11  . atorvastatin (LIPITOR) 20 MG tablet Take 1 tablet (20 mg total) by mouth daily at 6 PM. 30 tablet 6  .  DULoxetine (CYMBALTA) 20 MG capsule Take 1 capsule (20 mg total) by mouth at bedtime. Take 1 tablet by mouth daily to help treat anxiety and nerves. 90 capsule 3  . furosemide (LASIX) 40 MG tablet Take 1 tablet (40 mg total) by mouth 2 (two) times daily. 60 tablet 6  . iron polysaccharides (NU-IRON) 150 MG capsule Take 1 capsule (150 mg total) by mouth 2 (two) times daily. 60 capsule 3  . metoprolol succinate (TOPROL-XL) 25 MG 24 hr tablet Take 1 tablet (25 mg total) by mouth daily. 30 tablet 6  . montelukast (SINGULAIR) 10 MG tablet Take 1 tablet (10 mg total) by mouth at bedtime. 30 tablet 3  . pantoprazole (PROTONIX) 40 MG tablet Take 1 tablet (40 mg total) by mouth daily. 30 tablet 3  . saxagliptin HCl (ONGLYZA) 2.5 MG TABS tablet Take 1 tablet (2.5 mg total) by mouth daily. Take one tablet by mouth once daily for blood sugar 30 tablet 2  . senna-docusate (SENOKOT-S) 8.6-50 MG tablet Take 1 tablet by mouth daily as  needed for mild constipation.    Marland Kitchen spironolactone (ALDACTONE) 25 MG tablet Take 1 tablet (25 mg total) by mouth daily. 30 tablet 6  . traZODone (DESYREL) 50 MG tablet Take 1 tablet (50 mg total) by mouth at bedtime as needed for sleep. 20 tablet 0  . triamcinolone (KENALOG) 0.025 % cream Apply 1 application topically 4 (four) times daily as needed (itching).     No current facility-administered medications for this visit.    Allergies:   Beta adrenergic blockers; Jardiance; Fluzone; and Tetanus toxoids    Social History:   Social History   Social History  . Marital Status: Widowed    Spouse Name: N/A  . Number of Children: N/A  . Years of Education: N/A   Social History Main Topics  . Smoking status: Never Smoker   . Smokeless tobacco: Never Used  . Alcohol Use: No  . Drug Use: No  . Sexual Activity: No   Other Topics Concern  . Not on file   Social History Narrative   Physically inactive. She says she hurts in her back and knees too much to do much walking.      Family History:   Family History  Problem Relation Age of Onset  . Breast cancer Mother   . Cancer Mother   . Diabetes Daughter   . Hypertension Daughter   . Cancer Brother       ROS:   Please see the history of present illness.   ROS    PHYSICAL EXAM: VS:  There were no vitals taken for this visit.    Wt Readings from Last 3 Encounters:  08/10/15 154 lb 14.4 oz (70.262 kg)  08/03/15 169 lb 1.6 oz (76.703 kg)  07/28/15 188 lb 3.2 oz (85.367 kg)     GEN: Well nourished, well developed, in no acute distress HEENT: normal Neck: no JVD, no carotid bruits, no masses Cardiac:  Normal S1/S2, RRR; no murmur ,  no rubs or gallops, no edema  Respiratory:  clear to auscultation bilaterally, no wheezing, rhonchi or rales. GI: soft, nontender, nondistended, + BS MS: no deformity or atrophy Skin: warm and dry  Neuro:  CNs II-XII intact, Strength and sensation are intact Psych: Normal affect   EKG:  EKG is ordered today.  It demonstrates:      Recent Labs: 07/18/2015: ALT 11* 08/05/2015: TSH 4.340 08/10/2015: B Natriuretic Peptide 1950.9*; Hemoglobin 11.1*; Platelets 143* 08/11/2015: BUN 29*; Creatinine, Ser 2.08*; Potassium 3.9; Sodium 137    Lipid Panel    Component Value Date/Time   CHOL 115 04/28/2015 0857   CHOL 168 01/09/2012 0514   TRIG 61 04/28/2015 0857   HDL 55 04/28/2015 0857   HDL 34* 01/09/2012 0514   CHOLHDL 2.1 04/28/2015 0857   CHOLHDL 4.9 01/09/2012 0514   VLDL 27 01/09/2012 0514   LDLCALC 48 04/28/2015 0857   LDLCALC 107* 01/09/2012 0514      ASSESSMENT AND PLAN:      Medication Changes: Current medicines are reviewed at length with the patient today.  Concerns regarding medicines are as outlined above.  The following changes have been made:   Discontinued Medications   No medications on file   Modified Medications   No medications on file   New Prescriptions   No medications on file   Labs/ tests ordered today include:   No  orders of the defined types were placed in this encounter.      Disposition:    FU with  Signed, Brynda Rim, MHS 08/13/2015 5:59 PM    [facility] [address] [place], [place], [place]  [address] Phone: [phone]; Fax: [phone]    This encounter was created in error - please disregard.

## 2015-08-13 NOTE — Patient Outreach (Signed)
Error.   [name] M.   Pierzchala RN CCM [facility] Care Management  [phone]  

## 2015-08-14 ENCOUNTER — Encounter: Payer: Medicare Other | Admitting: Physician Assistant

## 2015-08-14 ENCOUNTER — Other Ambulatory Visit: Payer: Self-pay | Admitting: Pharmacist

## 2015-08-14 DIAGNOSIS — E1140 Type 2 diabetes mellitus with diabetic neuropathy, unspecified: Secondary | ICD-10-CM | POA: Diagnosis not present

## 2015-08-14 DIAGNOSIS — I083 Combined rheumatic disorders of mitral, aortic and tricuspid valves: Secondary | ICD-10-CM | POA: Diagnosis not present

## 2015-08-14 DIAGNOSIS — N183 Chronic kidney disease, stage 3 (moderate): Secondary | ICD-10-CM | POA: Diagnosis not present

## 2015-08-14 DIAGNOSIS — I4891 Unspecified atrial fibrillation: Secondary | ICD-10-CM | POA: Diagnosis not present

## 2015-08-14 DIAGNOSIS — Z950 Presence of cardiac pacemaker: Secondary | ICD-10-CM | POA: Diagnosis not present

## 2015-08-14 DIAGNOSIS — I129 Hypertensive chronic kidney disease with stage 1 through stage 4 chronic kidney disease, or unspecified chronic kidney disease: Secondary | ICD-10-CM | POA: Diagnosis not present

## 2015-08-14 DIAGNOSIS — I2510 Atherosclerotic heart disease of native coronary artery without angina pectoris: Secondary | ICD-10-CM | POA: Diagnosis not present

## 2015-08-14 DIAGNOSIS — D649 Anemia, unspecified: Secondary | ICD-10-CM | POA: Diagnosis not present

## 2015-08-14 DIAGNOSIS — I5023 Acute on chronic systolic (congestive) heart failure: Secondary | ICD-10-CM | POA: Diagnosis not present

## 2015-08-14 DIAGNOSIS — E1122 Type 2 diabetes mellitus with diabetic chronic kidney disease: Secondary | ICD-10-CM | POA: Diagnosis not present

## 2015-08-14 NOTE — Patient Outreach (Signed)
[facility]) Care Management  [facility] CM Pharmacy   08/14/2015  ADDILEY FARA 05-10-1932 [id number]  Subjective: Danielle Benitez is a 81yo with a recent hospitalization for acute on chronic systolic heart failure.  I identified patient as eligible for pharmacy transition of care project.  Medication review completed.  I called patient and read consent.  Patient declined to participate in transition of care project.    Patient has hospital follow up scheduled for 08/21/15 at 9:30AM.    Objective:   Current Medications: Current Outpatient Prescriptions  Medication Sig Dispense Refill  . acetaminophen (TYLENOL) 325 MG tablet Take 2 tablets (650 mg total) by mouth every 4 (four) hours as needed for headache or mild pain.    Marland Kitchen albuterol (PROVENTIL HFA;VENTOLIN HFA) 108 (90 BASE) MCG/ACT inhaler Inhale 2 puffs into the lungs every 6 (six) hours as needed for wheezing or shortness of breath. 1 Inhaler 0  . allopurinol (ZYLOPRIM) 300 MG tablet Take 1 tablet (300 mg total) by mouth daily. 90 tablet 3  . amiodarone (PACERONE) 200 MG tablet Take 2tablets (400mg  total) twice daily x 1week. Then take 1tablet (200mg  total) twice daily x 1week. Then take 1 tablet (200mg  total) daily 65 tablet 1  . apixaban (ELIQUIS) 2.5 MG TABS tablet Take 1 tablet (2.5 mg total) by mouth 2 (two) times daily. 60 tablet 11  . atorvastatin (LIPITOR) 20 MG tablet Take 1 tablet (20 mg total) by mouth daily at 6 PM. 30 tablet 6  . DULoxetine (CYMBALTA) 20 MG capsule Take 1 capsule (20 mg total) by mouth at bedtime. Take 1 tablet by mouth daily to help treat anxiety and nerves. 90 capsule 3  . furosemide (LASIX) 40 MG tablet Take 1 tablet (40 mg total) by mouth 2 (two) times daily. 60 tablet 6  . iron polysaccharides (NU-IRON) 150 MG capsule Take 1 capsule (150 mg total) by mouth 2 (two) times daily. 60 capsule 3  . metoprolol succinate (TOPROL-XL) 25 MG 24 hr tablet Take 1 tablet (25 mg total) by mouth daily. 30 tablet  6  . montelukast (SINGULAIR) 10 MG tablet Take 1 tablet (10 mg total) by mouth at bedtime. 30 tablet 3  . pantoprazole (PROTONIX) 40 MG tablet Take 1 tablet (40 mg total) by mouth daily. 30 tablet 3  . saxagliptin HCl (ONGLYZA) 2.5 MG TABS tablet Take 1 tablet (2.5 mg total) by mouth daily. Take one tablet by mouth once daily for blood sugar 30 tablet 2  . senna-docusate (SENOKOT-S) 8.6-50 MG tablet Take 1 tablet by mouth daily as needed for mild constipation.    Marland Kitchen spironolactone (ALDACTONE) 25 MG tablet Take 1 tablet (25 mg total) by mouth daily. 30 tablet 6  . traZODone (DESYREL) 50 MG tablet Take 1 tablet (50 mg total) by mouth at bedtime as needed for sleep. 20 tablet 0  . triamcinolone (KENALOG) 0.025 % cream Apply 1 application topically 4 (four) times daily as needed (itching).     No current facility-administered medications for this visit.    Functional Status: In your present state of health, do you have any difficulty performing the following activities: 08/05/2015 08/05/2015  Hearing? N N  Vision? N N  Difficulty concentrating or making decisions? N N  Walking or climbing stairs? Y Y  Dressing or bathing? N N  Doing errands, shopping? Y -    Fall/Depression Screening: PHQ 2/9 Scores 05/06/2015 12/19/2014 08/14/2014 02/27/2014 11/20/2013  PHQ - 2 Score 0 0 0 0 1  Assessment: 1.  Medication review:   Drugs sorted by system:  Neurologic/Psychologic:  duloxetine, trazodone  Cardiovascular:  amiodarone, apixaban, atorvastatin, furosemide, metoprolol succinate, spironolactone  Pulmonary/Allergy:  albuterol HFA, montelukast  Gastrointestinal:  Pantoprazole, sennosides-docusate  Endocrine:  saxagliptin  Renal:  none  Topical:  triamcinolone  Pain:  acetaminophen  Infectious Diseases:  none  Miscellaneous:  allopurinol, polysaccharide iron complex   Duplications in therapy:  None noted Gaps in therapy:  ACEi/ARB for LVSD  - held during admission due to acute on  chronic renal failure  Medications to avoid in the elderly:  Pantoprazole (increased risk of Clostridium difficile infection and bone loss and fractures) Drug interactions:  None noted Other issues noted: none   2.  Medication adherence: unable to assess  Plan:  1.  Medication review:  All medications appear to be appropriate based on patient's problem list.  Please use caution with medications on the [facility] list.  Patient has LVSD which is compelling indication for ACEi or ARB use.  Unless the renal failure is attributed to ACEi/ARB use, these medications are still indicated.  Will send a fax to patient's cardiology office with this information.    2.  Medication adherence:  Unable to assess.  Lilla Shook, Pharm.D. Pharmacy Resident [facility] [phone]

## 2015-08-15 ENCOUNTER — Other Ambulatory Visit: Payer: Self-pay | Admitting: *Deleted

## 2015-08-15 DIAGNOSIS — E1140 Type 2 diabetes mellitus with diabetic neuropathy, unspecified: Secondary | ICD-10-CM | POA: Diagnosis not present

## 2015-08-15 DIAGNOSIS — I129 Hypertensive chronic kidney disease with stage 1 through stage 4 chronic kidney disease, or unspecified chronic kidney disease: Secondary | ICD-10-CM | POA: Diagnosis not present

## 2015-08-15 DIAGNOSIS — Z950 Presence of cardiac pacemaker: Secondary | ICD-10-CM | POA: Diagnosis not present

## 2015-08-15 DIAGNOSIS — N183 Chronic kidney disease, stage 3 (moderate): Secondary | ICD-10-CM | POA: Diagnosis not present

## 2015-08-15 DIAGNOSIS — D649 Anemia, unspecified: Secondary | ICD-10-CM | POA: Diagnosis not present

## 2015-08-15 DIAGNOSIS — I4891 Unspecified atrial fibrillation: Secondary | ICD-10-CM | POA: Diagnosis not present

## 2015-08-15 DIAGNOSIS — I5023 Acute on chronic systolic (congestive) heart failure: Secondary | ICD-10-CM | POA: Diagnosis not present

## 2015-08-15 DIAGNOSIS — E1122 Type 2 diabetes mellitus with diabetic chronic kidney disease: Secondary | ICD-10-CM | POA: Diagnosis not present

## 2015-08-15 DIAGNOSIS — I2510 Atherosclerotic heart disease of native coronary artery without angina pectoris: Secondary | ICD-10-CM | POA: Diagnosis not present

## 2015-08-15 DIAGNOSIS — I083 Combined rheumatic disorders of mitral, aortic and tricuspid valves: Secondary | ICD-10-CM | POA: Diagnosis not present

## 2015-08-15 NOTE — Patient Outreach (Signed)
Transition of care (week 1, discharged 10/31):  Spoke with pt, HIPPA verified.  Pt states feels better, still weak.  Pt reports HH PT came yesterday, said doing good, coming back 11/7 and [facility] RN coming today.  Pt reports  she struggles with eating, has to cut out salt, got No Salt yesterday, knows has to be compliant with Low Na+diet.    Pt reports no swelling, weighing every day, taking Lasix.   RN CM discussed [facility] transition of care program, f/u with weekly phone calls for 4 weeks as well as doing a home visit.  With RN CM calling  daughter later today to discuss pt's discharge medications, pt states to schedule home visit  with her.      Plan to f/u telephonically with daughter later today to discuss pt's discharge medication, schedule home visit with pt.    Shayne Alken.   Pierzchala RN CCM [facility] Care Management  [phone]

## 2015-08-15 NOTE — Patient Outreach (Signed)
Follow up call: Spoke with pt's daughter Colin Mulders (on [facility] consent form), reviewed pt's  Medications which  matched medications on discharge summary.  Also, as requested of  pt earlier today, discussed with daughter scheduling a home visit for pt.   Home visit scheduled for 11/10.  Daughter states pt is to f/u with Dr. Shirlee Latch 11/10, did not make a f/u visit with Dr. Chilton Si as pt is changing (Primary Care)doctors- scheduled to see Dr. Para March 11/14.   RN CM inquired about pt's weights to which daughter reports pt lost a pound yesterday, same today at 146 lbs., has not gained since hospital discharge.  Discussed with pt's daughter a weight gain of 3 lbs in a day, 5 lbs in a week, pt is to call MD to which she voiced understanding.    Plan to f/u with pt again on 11/10- home visit.     Shayne Alken.   Lakyn Alsteen RN CCM [facility] Care Management  [phone]

## 2015-08-18 DIAGNOSIS — I4891 Unspecified atrial fibrillation: Secondary | ICD-10-CM | POA: Diagnosis not present

## 2015-08-18 DIAGNOSIS — N183 Chronic kidney disease, stage 3 (moderate): Secondary | ICD-10-CM | POA: Diagnosis not present

## 2015-08-18 DIAGNOSIS — I129 Hypertensive chronic kidney disease with stage 1 through stage 4 chronic kidney disease, or unspecified chronic kidney disease: Secondary | ICD-10-CM | POA: Diagnosis not present

## 2015-08-18 DIAGNOSIS — I2510 Atherosclerotic heart disease of native coronary artery without angina pectoris: Secondary | ICD-10-CM | POA: Diagnosis not present

## 2015-08-18 DIAGNOSIS — E1140 Type 2 diabetes mellitus with diabetic neuropathy, unspecified: Secondary | ICD-10-CM | POA: Diagnosis not present

## 2015-08-18 DIAGNOSIS — I083 Combined rheumatic disorders of mitral, aortic and tricuspid valves: Secondary | ICD-10-CM | POA: Diagnosis not present

## 2015-08-18 DIAGNOSIS — I5023 Acute on chronic systolic (congestive) heart failure: Secondary | ICD-10-CM | POA: Diagnosis not present

## 2015-08-18 DIAGNOSIS — D649 Anemia, unspecified: Secondary | ICD-10-CM | POA: Diagnosis not present

## 2015-08-18 DIAGNOSIS — Z950 Presence of cardiac pacemaker: Secondary | ICD-10-CM | POA: Diagnosis not present

## 2015-08-18 DIAGNOSIS — E1122 Type 2 diabetes mellitus with diabetic chronic kidney disease: Secondary | ICD-10-CM | POA: Diagnosis not present

## 2015-08-19 DIAGNOSIS — E1122 Type 2 diabetes mellitus with diabetic chronic kidney disease: Secondary | ICD-10-CM | POA: Diagnosis not present

## 2015-08-19 DIAGNOSIS — I4891 Unspecified atrial fibrillation: Secondary | ICD-10-CM | POA: Diagnosis not present

## 2015-08-19 DIAGNOSIS — I5023 Acute on chronic systolic (congestive) heart failure: Secondary | ICD-10-CM | POA: Diagnosis not present

## 2015-08-19 DIAGNOSIS — I2510 Atherosclerotic heart disease of native coronary artery without angina pectoris: Secondary | ICD-10-CM | POA: Diagnosis not present

## 2015-08-19 DIAGNOSIS — Z950 Presence of cardiac pacemaker: Secondary | ICD-10-CM | POA: Diagnosis not present

## 2015-08-19 DIAGNOSIS — N183 Chronic kidney disease, stage 3 (moderate): Secondary | ICD-10-CM | POA: Diagnosis not present

## 2015-08-19 DIAGNOSIS — I083 Combined rheumatic disorders of mitral, aortic and tricuspid valves: Secondary | ICD-10-CM | POA: Diagnosis not present

## 2015-08-19 DIAGNOSIS — E1140 Type 2 diabetes mellitus with diabetic neuropathy, unspecified: Secondary | ICD-10-CM | POA: Diagnosis not present

## 2015-08-19 DIAGNOSIS — I129 Hypertensive chronic kidney disease with stage 1 through stage 4 chronic kidney disease, or unspecified chronic kidney disease: Secondary | ICD-10-CM | POA: Diagnosis not present

## 2015-08-19 DIAGNOSIS — D649 Anemia, unspecified: Secondary | ICD-10-CM | POA: Diagnosis not present

## 2015-08-20 DIAGNOSIS — D649 Anemia, unspecified: Secondary | ICD-10-CM | POA: Diagnosis not present

## 2015-08-20 DIAGNOSIS — I129 Hypertensive chronic kidney disease with stage 1 through stage 4 chronic kidney disease, or unspecified chronic kidney disease: Secondary | ICD-10-CM | POA: Diagnosis not present

## 2015-08-20 DIAGNOSIS — I2510 Atherosclerotic heart disease of native coronary artery without angina pectoris: Secondary | ICD-10-CM | POA: Diagnosis not present

## 2015-08-20 DIAGNOSIS — Z950 Presence of cardiac pacemaker: Secondary | ICD-10-CM | POA: Diagnosis not present

## 2015-08-20 DIAGNOSIS — E1122 Type 2 diabetes mellitus with diabetic chronic kidney disease: Secondary | ICD-10-CM | POA: Diagnosis not present

## 2015-08-20 DIAGNOSIS — I4891 Unspecified atrial fibrillation: Secondary | ICD-10-CM | POA: Diagnosis not present

## 2015-08-20 DIAGNOSIS — I083 Combined rheumatic disorders of mitral, aortic and tricuspid valves: Secondary | ICD-10-CM | POA: Diagnosis not present

## 2015-08-20 DIAGNOSIS — E1140 Type 2 diabetes mellitus with diabetic neuropathy, unspecified: Secondary | ICD-10-CM | POA: Diagnosis not present

## 2015-08-20 DIAGNOSIS — N183 Chronic kidney disease, stage 3 (moderate): Secondary | ICD-10-CM | POA: Diagnosis not present

## 2015-08-20 DIAGNOSIS — I5023 Acute on chronic systolic (congestive) heart failure: Secondary | ICD-10-CM | POA: Diagnosis not present

## 2015-08-21 ENCOUNTER — Ambulatory Visit (HOSPITAL_COMMUNITY)
Admit: 2015-08-21 | Discharge: 2015-08-21 | Disposition: A | Discharge status: Home / Self Care | Payer: Medicare Other | Source: Ambulatory Visit | Attending: Cardiology | Admitting: Cardiology

## 2015-08-21 ENCOUNTER — Other Ambulatory Visit: Payer: Self-pay | Admitting: *Deleted

## 2015-08-21 ENCOUNTER — Encounter (HOSPITAL_COMMUNITY): Payer: Self-pay

## 2015-08-21 ENCOUNTER — Telehealth (HOSPITAL_COMMUNITY): Payer: Self-pay | Admitting: *Deleted

## 2015-08-21 VITALS — BP 110/64 | HR 97 | Wt 150.4 lb

## 2015-08-21 DIAGNOSIS — N183 Chronic kidney disease, stage 3 unspecified: Secondary | ICD-10-CM

## 2015-08-21 DIAGNOSIS — Z7902 Long term (current) use of antithrombotics/antiplatelets: Secondary | ICD-10-CM | POA: Insufficient documentation

## 2015-08-21 DIAGNOSIS — I5022 Chronic systolic (congestive) heart failure: Secondary | ICD-10-CM | POA: Insufficient documentation

## 2015-08-21 DIAGNOSIS — M109 Gout, unspecified: Secondary | ICD-10-CM | POA: Insufficient documentation

## 2015-08-21 DIAGNOSIS — E785 Hyperlipidemia, unspecified: Secondary | ICD-10-CM | POA: Insufficient documentation

## 2015-08-21 DIAGNOSIS — I429 Cardiomyopathy, unspecified: Secondary | ICD-10-CM | POA: Diagnosis not present

## 2015-08-21 DIAGNOSIS — I480 Paroxysmal atrial fibrillation: Secondary | ICD-10-CM | POA: Diagnosis not present

## 2015-08-21 DIAGNOSIS — E1122 Type 2 diabetes mellitus with diabetic chronic kidney disease: Secondary | ICD-10-CM | POA: Diagnosis not present

## 2015-08-21 DIAGNOSIS — Z79899 Other long term (current) drug therapy: Secondary | ICD-10-CM | POA: Insufficient documentation

## 2015-08-21 DIAGNOSIS — Z950 Presence of cardiac pacemaker: Secondary | ICD-10-CM | POA: Diagnosis not present

## 2015-08-21 DIAGNOSIS — R001 Bradycardia, unspecified: Secondary | ICD-10-CM | POA: Diagnosis not present

## 2015-08-21 DIAGNOSIS — I4891 Unspecified atrial fibrillation: Secondary | ICD-10-CM

## 2015-08-21 DIAGNOSIS — I130 Hypertensive heart and chronic kidney disease with heart failure and stage 1 through stage 4 chronic kidney disease, or unspecified chronic kidney disease: Secondary | ICD-10-CM | POA: Insufficient documentation

## 2015-08-21 LAB — CBC
HEMATOCRIT: 43.6 % (ref 36.0–46.0)
Hemoglobin: 13.7 g/dL (ref 12.0–15.0)
MCH: 26.5 pg (ref 26.0–34.0)
MCHC: 31.4 g/dL (ref 30.0–36.0)
MCV: 84.3 fL (ref 78.0–100.0)
PLATELETS: 149 10*3/uL — AB (ref 150–400)
RBC: 5.17 MIL/uL — AB (ref 3.87–5.11)
RDW: 22.7 % — AB (ref 11.5–15.5)
WBC: 5.1 10*3/uL (ref 4.0–10.5)

## 2015-08-21 LAB — COMPREHENSIVE METABOLIC PANEL
ALT: 18 U/L (ref 14–54)
AST: 34 U/L (ref 15–41)
Albumin: 3.9 g/dL (ref 3.5–5.0)
Alkaline Phosphatase: 97 U/L (ref 38–126)
Anion gap: 11 (ref 5–15)
BILIRUBIN TOTAL: 0.8 mg/dL (ref 0.3–1.2)
BUN: 42 mg/dL — AB (ref 6–20)
CO2: 29 mmol/L (ref 22–32)
CREATININE: 2.5 mg/dL — AB (ref 0.44–1.00)
Calcium: 9.9 mg/dL (ref 8.9–10.3)
Chloride: 94 mmol/L — ABNORMAL LOW (ref 101–111)
GFR, EST AFRICAN AMERICAN: 20 mL/min — AB (ref >60)
GFR, EST NON AFRICAN AMERICAN: 17 mL/min — AB (ref >60)
Glucose, Bld: 284 mg/dL — ABNORMAL HIGH (ref 65–99)
POTASSIUM: 4.9 mmol/L (ref 3.5–5.1)
Sodium: 134 mmol/L — ABNORMAL LOW (ref 135–145)
TOTAL PROTEIN: 7.7 g/dL (ref 6.5–8.1)

## 2015-08-21 LAB — TSH: TSH: 3.504 u[IU]/mL (ref 0.350–4.500)

## 2015-08-21 MED ORDER — FUROSEMIDE 40 MG PO TABS: 40.0000 mg | ORAL_TABLET | Freq: Every day | ORAL | Status: DC

## 2015-08-21 MED ORDER — METOPROLOL SUCCINATE ER 25 MG PO TB24: 25.0000 mg | ORAL_TABLET | Freq: Two times a day (BID) | ORAL | Status: DC

## 2015-08-21 NOTE — Patient Instructions (Signed)
Increase Metoprolol to 25 mg Twice daily   Labs today  Your physician has recommended that you have a pulmonary function test. Pulmonary Function Tests are a group of tests that measure how well air moves in and out of your lungs.  Your physician has requested that you have a lexiscan myoview. For further information please visit [email or web address] Please follow instruction sheet, as given.  You have been referred to Cardiac Rehab  Your physician recommends that you schedule a follow-up appointment in: 2 weeks

## 2015-08-21 NOTE — Telephone Encounter (Signed)
Changes to medication due to lab results

## 2015-08-21 NOTE — Patient Outreach (Signed)
[facility]) Care Management   08/21/2015  Danielle Benitez 10/31/1931 [id number]  Danielle Benitez is an 81 y.o. female  Subjective:  Pt reports saw Dr. Shirlee Latch today, MD discussed getting her into Cardiac rehab.  Pt states to f/u with new Primary Care MD 11/14.   Pt states her weight today was 143 lbs, ranging 143 -147 lbs.  Pt states was told to take it easy, good family support.   Pt reports  HH PT still  coming.    Objective:   Filed Vitals:   08/21/15 1520  BP: 104/64  Pulse: 73  Resp: 20    ROS  Physical Exam  Constitutional: She is oriented to person, place, and time. She appears well-developed and well-nourished.  Neurological: She is alert and oriented to person, place, and time.  Skin: Skin is warm and dry.  Psychiatric: She has a normal mood and affect. Her behavior is normal. Judgment and thought content normal.    Current Medications:  Medications reviewed with pt  Current Outpatient Prescriptions  Medication Sig Dispense Refill  . allopurinol (ZYLOPRIM) 300 MG tablet Take 1 tablet (300 mg total) by mouth daily. 90 tablet 3  . amiodarone (PACERONE) 200 MG tablet Take 2tablets (400mg  total) twice daily x 1week. Then take 1tablet (200mg  total) twice daily x 1week. Then take 1 tablet (200mg  total) daily 65 tablet 1  . apixaban (ELIQUIS) 2.5 MG TABS tablet Take 1 tablet (2.5 mg total) by mouth 2 (two) times daily. 60 tablet 11  . atorvastatin (LIPITOR) 20 MG tablet Take 1 tablet (20 mg total) by mouth daily at 6 PM. 30 tablet 6  . DULoxetine (CYMBALTA) 20 MG capsule Take 1 capsule (20 mg total) by mouth at bedtime. Take 1 tablet by mouth daily to help treat anxiety and nerves. 90 capsule 3  . furosemide (LASIX) 40 MG tablet Take 1 tablet (40 mg total) by mouth 2 (two) times daily. 60 tablet 6  . iron polysaccharides (NU-IRON) 150 MG capsule Take 1 capsule (150 mg total) by mouth 2 (two) times daily. 60 capsule 3  . metoprolol succinate (TOPROL-XL) 25 MG 24 hr  tablet Take 1 tablet (25 mg total) by mouth 2 (two) times daily. 60 tablet 6  . montelukast (SINGULAIR) 10 MG tablet Take 1 tablet (10 mg total) by mouth at bedtime. 30 tablet 3  . pantoprazole (PROTONIX) 40 MG tablet Take 1 tablet (40 mg total) by mouth daily. 30 tablet 3  . saxagliptin HCl (ONGLYZA) 2.5 MG TABS tablet Take 1 tablet (2.5 mg total) by mouth daily. Take one tablet by mouth once daily for blood sugar 30 tablet 2  . senna-docusate (SENOKOT-S) 8.6-50 MG tablet Take 1 tablet by mouth daily as needed for mild constipation.    Marland Kitchen spironolactone (ALDACTONE) 25 MG tablet Take 1 tablet (25 mg total) by mouth daily. 30 tablet 6  . traZODone (DESYREL) 50 MG tablet Take 1 tablet (50 mg total) by mouth at bedtime as needed for sleep. 20 tablet 0  . acetaminophen (TYLENOL) 325 MG tablet Take 2 tablets (650 mg total) by mouth every 4 (four) hours as needed for headache or mild pain.    Marland Kitchen albuterol (PROVENTIL HFA;VENTOLIN HFA) 108 (90 BASE) MCG/ACT inhaler Inhale 2 puffs into the lungs every 6 (six) hours as needed for wheezing or shortness of breath. (Patient not taking: Reported on 08/21/2015) 1 Inhaler 0  . triamcinolone (KENALOG) 0.025 % cream Apply 1 application topically 4 (four) times  daily as needed (itching).     No current facility-administered medications for this visit.    Functional Status:   In your present state of health, do you have any difficulty performing the following activities: 08/21/2015 08/05/2015  Hearing? Y N  Vision? N N  Difficulty concentrating or making decisions? N N  Walking or climbing stairs? Y Y  Dressing or bathing? Y N  Doing errands, shopping? Danielle Benitez  Preparing Food and eating ? Y -  Using the Toilet? N -  In the past six months, have you accidently leaked urine? Y -  Do you have problems with loss of bowel control? Y -  Managing your Medications? Y -  Managing your Finances? Y -  Housekeeping or managing your Housekeeping? Y -    Fall/Depression  Screening:    PHQ 2/9 Scores 08/21/2015 05/06/2015 12/19/2014 08/14/2014 02/27/2014 11/20/2013  PHQ - 2 Score 1 0 0 0 0 1    Assessment:   HF- pt reports weight today 143 lbs., ranges 143-147 lbs. No edema, lungs clear, no sob.                             Pt needs encouragement to continue to comply with Low Na+ diet- pt reports appetite down                              Some with  change to low Na+ diet.  RN CM reviewed the yellow HF zone with pt- when to call                             MD.                            DM: pt reports sugar today 147.  Plan:    HF: pt  to continue to weigh daily,record, call MD for weight gain of 3 lbs in a day, 5 lbs in a week.                      Pt to continue to be compliant with Low Na+ diet.                Pt to f/u with Dr. Para March (new primary care MD) 11/14- RN CM to inform MD next week of [facility]                     Involvement.                RN CM to continue to follow pt for transition of care- f/u again with pt telephonically 11/17.      [facility] CM Care Plan Problem Two        Most Recent Value   Care Plan Problem Two  Risk for readmission - 3 admits in 30 days, most recent HF, A-fib   Role Documenting the Problem Two  Care Management Coordinator   Care Plan for Problem Two  Active   Interventions for Problem Two Long Term Goal   RN CM to f/u with pt- weekly phone calls 4 weeks, schedule home visit.   discussed importance of weighing daily, compliance with low Na+ diet    [facility] Long Term Goal (31-90) days  Pt would not readmit within  31 days of day of discharge    [facility] Long Term Goal Start Date  08/15/15   [facility] CM Short Term Goal #1 (0-30 days)  Pt would continue to be compliant with Low Na+ diet in the next 30 days   [facility] CM Short Term Goal #1 Start Date  08/15/15   Interventions for Short Term Goal #2   Reinforced need for pt to continue to be compliant with Low Na+ diet, avoid readmission    [facility] CM Short Term Goal #2 (0-30 days)  Pt would not have a  weight gain of 3 lbs in a day, 5 lbs in a week in the next 30days    [facility] CM Short Term Goal #2 Start Date  08/15/15   Interventions for Short Term Goal #2  Discussed with daughter calling MD if pt has a weight gain of 3 lbs in a day, 5 lbs in a week.      Shayne Alken.   Pierzchala RN CCM [facility] Care Management  [phone]

## 2015-08-21 NOTE — Progress Notes (Signed)
Patient ID: Danielle Benitez, female   DOB: [birth year], 81 y.o.   MRN: [id number]  PCP: Dr. Para March HF Cardiology: Dr. Shirlee Latch  81 yo with history of HTN, orthostatic hypotension, symptomatic bradycardia with Medtronic PPM, paroxysmal atrial fibrillation chronic systolic CHF, CKD stage III, and iron deficiency anemia presents for cardiology followup. Around the beginning of 9/16, she began to develop exertional dyspnea. She was seen in the cardiology office on 07/25/15. She was in atrial fibrillation with RVR and echo showed EF 35-40% (had been 50-55% in past). PPM interrogation showed that atrial fibrillation had begun 07/11/15, so exertional dyspnea had preceded this by about 4 weeks. She was admitted from 10/7-10/17. She was anemic with hemoglobin 8.7. She was heme negative but Fe deficient. EGD was not done. She had TEE-guided DCCV to NSR. She also had AKI with creatinine up to 3.41, but it trended back down. She was sent home in NSR. She was readmitted 10/19-10/23 with recurrent atrial fibrillation and acute on chronic systolic CHF. She was diuresed about 10 lbs. It was decided to rate control her rather than repeat cardioversion, and she was sent home. she was only at home for about a day and developed increased dyspnea so came back to the ER and was re-admitted.  She was diuresed with IV Lasix and started on amiodarone.  She underwent DCCV on 10/26 but atrial fibrillation recurred on 10/29.  More amiodarone was loaded, and she went back into NSR.   She comes today for hospital followup.  She is doing well.  She remains in NSR on amiodarone.  She has fatigue and mild dyspnea after walking 50-100 yards.  This is improved.  No orthopnea/PND.  NO lightheadedness, syncope, or falls.  No chest pain.  No tachypalpitations.  Weight is down 6 lbs since she got home.    ECG: a-paced, RBBB  Labs (10/16): K 3.9, creatinine 2.08  PMH: 1. HTN but also with history of orthostatic hypotension. 2. CKD stage  III 3. Aortic stenosis: Mild 4. Symptomatic bradycardia s/p Medtronic PPM. 5. Hyperlipidemia 6. Atrial fibrillation: Paroxysmal. TEE-guided DCCV in 10/16. She had DCCV again latera in 10/16 and was started on amiodarone.   7. Type II diabetes 8. Gout 9. Fe-deficiency anemia 10. Chronic systolic CHF: Echo 11/14 with EF 50-55%. Echo (10/16) with EF 35-40%, moderate LV dilation, diffuse hypokinesis, mild AS, moderate MR, PASP 48 mmHg.  11. Esophageal dysmotility  SH: Lives alone but daughter is nearby.  Nonsmoker. No ETOH.    FH: No premature CAD.   ROS: All systems reviewed and negative except as per HPI.    Current Outpatient Prescriptions  Medication Sig Dispense Refill  . acetaminophen (TYLENOL) 325 MG tablet Take 2 tablets (650 mg total) by mouth every 4 (four) hours as needed for headache or mild pain.    Marland Kitchen albuterol (PROVENTIL HFA;VENTOLIN HFA) 108 (90 BASE) MCG/ACT inhaler Inhale 2 puffs into the lungs every 6 (six) hours as needed for wheezing or shortness of breath. (Patient not taking: Reported on 08/21/2015) 1 Inhaler 0  . allopurinol (ZYLOPRIM) 300 MG tablet Take 1 tablet (300 mg total) by mouth daily. 90 tablet 3  . amiodarone (PACERONE) 200 MG tablet Take 2tablets (400mg  total) twice daily x 1week. Then take 1tablet (200mg  total) twice daily x 1week. Then take 1 tablet (200mg  total) daily 65 tablet 1  . apixaban (ELIQUIS) 2.5 MG TABS tablet Take 1 tablet (2.5 mg total) by mouth 2 (two) times daily. 60 tablet 11  .  atorvastatin (LIPITOR) 20 MG tablet Take 1 tablet (20 mg total) by mouth daily at 6 PM. 30 tablet 6  . DULoxetine (CYMBALTA) 20 MG capsule Take 1 capsule (20 mg total) by mouth at bedtime. Take 1 tablet by mouth daily to help treat anxiety and nerves. 90 capsule 3  . iron polysaccharides (NU-IRON) 150 MG capsule Take 1 capsule (150 mg total) by mouth 2 (two) times daily. 60 capsule 3  . metoprolol succinate (TOPROL-XL) 25 MG 24 hr tablet Take 1 tablet (25 mg total)  by mouth 2 (two) times daily. 60 tablet 6  . montelukast (SINGULAIR) 10 MG tablet Take 1 tablet (10 mg total) by mouth at bedtime. 30 tablet 3  . pantoprazole (PROTONIX) 40 MG tablet Take 1 tablet (40 mg total) by mouth daily. 30 tablet 3  . saxagliptin HCl (ONGLYZA) 2.5 MG TABS tablet Take 1 tablet (2.5 mg total) by mouth daily. Take one tablet by mouth once daily for blood sugar 30 tablet 2  . senna-docusate (SENOKOT-S) 8.6-50 MG tablet Take 1 tablet by mouth daily as needed for mild constipation.    Marland Kitchen spironolactone (ALDACTONE) 25 MG tablet Take 1 tablet (25 mg total) by mouth daily. 30 tablet 6  . traZODone (DESYREL) 50 MG tablet Take 1 tablet (50 mg total) by mouth at bedtime as needed for sleep. 20 tablet 0  . triamcinolone (KENALOG) 0.025 % cream Apply 1 application topically 4 (four) times daily as needed (itching).    . furosemide (LASIX) 40 MG tablet Take 1 tablet (40 mg total) by mouth daily. 60 tablet 6   No current facility-administered medications for this encounter.   BP 110/64 mmHg  Pulse 97  Wt 150 lb 6.4 oz (68.221 kg)  SpO2 99% General: NAD Neck: No JVD, no thyromegaly or thyroid nodule.  Lungs: Clear to auscultation bilaterally with normal respiratory effort. CV: Nondisplaced PMI.  Heart regular S1/S2, no S3/S4, no murmur.  No peripheral edema.  No carotid bruit.  Normal pedal pulses.  Abdomen: Soft, nontender, no hepatosplenomegaly, no distention.  Skin: Intact without lesions or rashes.  Neurologic: Alert and oriented x 3.  Psych: Normal affect. Extremities: No clubbing or cyanosis.  HEENT: Normal.   Assessment/Plan: 1. Chronic systolic CHF: EF 16-10% by echo 10/16. Etiology uncertain: could be tachy-mediated cardiomyopathy, but symptoms seem to predate the onset of atrial fibrillation. Possible viral myocarditis. Cannot rule out ischemic cardiomyopathy, but no definite anginal symptoms. She is doing better.  NYHA class II-III symptoms.  She is not significantly  volume overloaded on exam.  - Continue Lasix 40 mg po bid.  - Increase Toprol XL to 25 mg bid and continue spironolactone 25 mg daily.  - Hold off on ACEI for now until we see that creatinine has improved.  - BMET today. - I will arrange for Lexiscan Cardiolite to assess for evidence of ischemia or infarction.  - cardiac rehab referral for CHF - Repeat echo in about 2 months, hopefully will remain in NSR.   2. Atrial fibrillation: Tolerates poorly. A-paced s/p DCCV 08/06/15.  - Continue Eliquis - Toprol XL to be increased to 25 mg bid.  - Continue amiodarone for maintenance of NSR.  Check LFTs and TSH today.  I will arrange for baseline PFTs.  She will need a regular eye exam.   3. CKD Stage III: BMET today.  4. Medtronic PPM for symptomatic bradycardia.  [name] 08/21/2015

## 2015-08-22 DIAGNOSIS — N183 Chronic kidney disease, stage 3 (moderate): Secondary | ICD-10-CM | POA: Diagnosis not present

## 2015-08-22 DIAGNOSIS — I5023 Acute on chronic systolic (congestive) heart failure: Secondary | ICD-10-CM | POA: Diagnosis not present

## 2015-08-22 DIAGNOSIS — I4891 Unspecified atrial fibrillation: Secondary | ICD-10-CM | POA: Diagnosis not present

## 2015-08-22 DIAGNOSIS — I083 Combined rheumatic disorders of mitral, aortic and tricuspid valves: Secondary | ICD-10-CM | POA: Diagnosis not present

## 2015-08-22 DIAGNOSIS — D649 Anemia, unspecified: Secondary | ICD-10-CM | POA: Diagnosis not present

## 2015-08-22 DIAGNOSIS — I129 Hypertensive chronic kidney disease with stage 1 through stage 4 chronic kidney disease, or unspecified chronic kidney disease: Secondary | ICD-10-CM | POA: Diagnosis not present

## 2015-08-22 DIAGNOSIS — I2510 Atherosclerotic heart disease of native coronary artery without angina pectoris: Secondary | ICD-10-CM | POA: Diagnosis not present

## 2015-08-22 DIAGNOSIS — Z950 Presence of cardiac pacemaker: Secondary | ICD-10-CM | POA: Diagnosis not present

## 2015-08-22 DIAGNOSIS — E1140 Type 2 diabetes mellitus with diabetic neuropathy, unspecified: Secondary | ICD-10-CM | POA: Diagnosis not present

## 2015-08-22 DIAGNOSIS — E1122 Type 2 diabetes mellitus with diabetic chronic kidney disease: Secondary | ICD-10-CM | POA: Diagnosis not present

## 2015-08-25 ENCOUNTER — Ambulatory Visit (INDEPENDENT_AMBULATORY_CARE_PROVIDER_SITE_OTHER): Payer: Medicare Other | Admitting: Family Medicine

## 2015-08-25 ENCOUNTER — Ambulatory Visit (INDEPENDENT_AMBULATORY_CARE_PROVIDER_SITE_OTHER): Payer: Medicare Other | Admitting: *Deleted

## 2015-08-25 ENCOUNTER — Telehealth: Payer: Self-pay | Admitting: Cardiology

## 2015-08-25 ENCOUNTER — Encounter: Payer: Self-pay | Admitting: Family Medicine

## 2015-08-25 VITALS — BP 110/72 | HR 87 | Temp 97.4°F | Ht 60.0 in | Wt 150.5 lb

## 2015-08-25 DIAGNOSIS — I2510 Atherosclerotic heart disease of native coronary artery without angina pectoris: Secondary | ICD-10-CM | POA: Diagnosis not present

## 2015-08-25 DIAGNOSIS — H6980 Other specified disorders of Eustachian tube, unspecified ear: Secondary | ICD-10-CM

## 2015-08-25 DIAGNOSIS — D649 Anemia, unspecified: Secondary | ICD-10-CM | POA: Diagnosis not present

## 2015-08-25 DIAGNOSIS — I4891 Unspecified atrial fibrillation: Secondary | ICD-10-CM

## 2015-08-25 DIAGNOSIS — I129 Hypertensive chronic kidney disease with stage 1 through stage 4 chronic kidney disease, or unspecified chronic kidney disease: Secondary | ICD-10-CM | POA: Diagnosis not present

## 2015-08-25 DIAGNOSIS — E1140 Type 2 diabetes mellitus with diabetic neuropathy, unspecified: Secondary | ICD-10-CM | POA: Diagnosis not present

## 2015-08-25 DIAGNOSIS — E1122 Type 2 diabetes mellitus with diabetic chronic kidney disease: Secondary | ICD-10-CM | POA: Diagnosis not present

## 2015-08-25 DIAGNOSIS — Z950 Presence of cardiac pacemaker: Secondary | ICD-10-CM | POA: Diagnosis not present

## 2015-08-25 DIAGNOSIS — Z23 Encounter for immunization: Secondary | ICD-10-CM

## 2015-08-25 DIAGNOSIS — E1121 Type 2 diabetes mellitus with diabetic nephropathy: Secondary | ICD-10-CM

## 2015-08-25 DIAGNOSIS — I083 Combined rheumatic disorders of mitral, aortic and tricuspid valves: Secondary | ICD-10-CM | POA: Diagnosis not present

## 2015-08-25 DIAGNOSIS — N183 Chronic kidney disease, stage 3 (moderate): Secondary | ICD-10-CM | POA: Diagnosis not present

## 2015-08-25 DIAGNOSIS — I5023 Acute on chronic systolic (congestive) heart failure: Secondary | ICD-10-CM | POA: Diagnosis not present

## 2015-08-25 DIAGNOSIS — E1165 Type 2 diabetes mellitus with hyperglycemia: Secondary | ICD-10-CM

## 2015-08-25 NOTE — Telephone Encounter (Signed)
LMOVM reminding pt to send remote transmission.   

## 2015-08-25 NOTE — Progress Notes (Signed)
Pre visit review using our clinic review tool, if applicable. No additional management support is needed unless otherwise documented below in the visit note.]  To est care with me.   CHF, with mult recent admissions complicated by cardio/renal issues and AF.  Now in NSR after diuresis and amiodarone loading.  Now with PT coming to the house now.  Will go to cardiac rehab thereafter.  Weight is down, much less edema now.  No ADE on meds.  CHF AF and cardiorenal issues d/w pt, related to her prev labs.   Can walk about 50 yads now.  No CP, SOB, BLE edema now.   HOH worse recently.  Bilateral sx.    H/o iron def anemia w/o known bleeding source.  Still on iron.  Last CBC was improved.  D/w pt and daughter.   DM2.  Compliant with meds. Last A1c 7.3 and this is acceptable for now.  Sugar usually 150-170s at home w/o ADE on med.   PMH and SH reviewed  ROS: See HPI, otherwise noncontributory.  Meds, vitals, and allergies reviewed.   GEN: nad, alert and oriented HEENT: mucous membranes moist, tm wnl B NECK: supple w/o LA CV: rrr. Not irregular.  PULM: ctab, no inc wob ABD: soft, +bs EXT: no edema SKIN: no acute rash

## 2015-08-25 NOTE — Patient Instructions (Signed)
Don't change your meds for now.  I'll check with your other docs to coordinate your follow up labs.  Take care.  Glad to see you.

## 2015-08-26 DIAGNOSIS — I5023 Acute on chronic systolic (congestive) heart failure: Secondary | ICD-10-CM | POA: Diagnosis not present

## 2015-08-26 DIAGNOSIS — E1122 Type 2 diabetes mellitus with diabetic chronic kidney disease: Secondary | ICD-10-CM | POA: Diagnosis not present

## 2015-08-26 DIAGNOSIS — E1140 Type 2 diabetes mellitus with diabetic neuropathy, unspecified: Secondary | ICD-10-CM | POA: Diagnosis not present

## 2015-08-26 DIAGNOSIS — Z950 Presence of cardiac pacemaker: Secondary | ICD-10-CM | POA: Diagnosis not present

## 2015-08-26 DIAGNOSIS — I129 Hypertensive chronic kidney disease with stage 1 through stage 4 chronic kidney disease, or unspecified chronic kidney disease: Secondary | ICD-10-CM | POA: Diagnosis not present

## 2015-08-26 DIAGNOSIS — I2510 Atherosclerotic heart disease of native coronary artery without angina pectoris: Secondary | ICD-10-CM | POA: Diagnosis not present

## 2015-08-26 DIAGNOSIS — I083 Combined rheumatic disorders of mitral, aortic and tricuspid valves: Secondary | ICD-10-CM | POA: Diagnosis not present

## 2015-08-26 DIAGNOSIS — D649 Anemia, unspecified: Secondary | ICD-10-CM | POA: Diagnosis not present

## 2015-08-26 DIAGNOSIS — I4891 Unspecified atrial fibrillation: Secondary | ICD-10-CM | POA: Diagnosis not present

## 2015-08-26 DIAGNOSIS — N183 Chronic kidney disease, stage 3 (moderate): Secondary | ICD-10-CM | POA: Diagnosis not present

## 2015-08-26 NOTE — Progress Notes (Signed)
Remote pacemaker transmission.   

## 2015-08-27 ENCOUNTER — Telehealth: Payer: Self-pay

## 2015-08-27 NOTE — Telephone Encounter (Signed)
Pt is taking Eliquis and [name] pts daughter wants to know if can take ibuprofen with Eliquis; advised [name] should not take ibuprofen or any anti inflammatory med with Eliquis; pt has previously taken tylenol for h/a. Advised tylenol should be OK.   [name] wants to know is there a med pt can take for imflammation of knee when knee is swollen besides tylenol.

## 2015-08-27 NOTE — Telephone Encounter (Signed)
Can try icing the joint.  We don't have great options o/w.  I would try ice/tylenol and then update me if not effective.  Thanks.

## 2015-08-27 NOTE — Telephone Encounter (Signed)
Patient's daughter [name]) notified by telephone as instructed and verbalized understanding.

## 2015-08-28 ENCOUNTER — Encounter: Payer: Self-pay | Admitting: Family Medicine

## 2015-08-28 ENCOUNTER — Other Ambulatory Visit: Payer: Self-pay | Admitting: *Deleted

## 2015-08-28 DIAGNOSIS — H6980 Other specified disorders of Eustachian tube, unspecified ear: Secondary | ICD-10-CM | POA: Insufficient documentation

## 2015-08-28 NOTE — Assessment & Plan Note (Signed)
Sugar acceptable for now, we can recheck A1c later on.  No reason to change meds at this point.

## 2015-08-28 NOTE — Assessment & Plan Note (Signed)
Now appears euvolemic, would continue meds as is.  I'll check on coordinating her f/u labs.  See above re: discussion. >25 minutes spent in face to face time with patient, >50% spent in counselling or coordination of care.

## 2015-08-28 NOTE — Assessment & Plan Note (Signed)
Likely causing the changes in hearing, can gently try valsalva.

## 2015-08-28 NOTE — Patient Outreach (Signed)
Transition of care call (week 3, discharged 10/31):   Pt reports doing good, saw Dr. Para March (new Primary Care MD) 11/14, no medication changes, gave me pneumonia shot.  Pt states sugars are up, 150,165, MD not too concerned at this time (take time to come down).   Pt reports  weights standing at 143 lbs., no swelling, no sob except if  walk too far.  Pt reports appetite still compromised, getting used to Low Na+ diet.   As discussed, plan to f/u again telephonically 11/23 as part of ongoing transition of care.    Shayne Alken.   Pierzchala RN CCM [facility] Care Management  [phone]

## 2015-08-28 NOTE — Assessment & Plan Note (Signed)
Now improved, I wouldn't stop iron at this point.  D/w pt she agrees.

## 2015-08-29 ENCOUNTER — Telehealth (HOSPITAL_COMMUNITY): Payer: Self-pay

## 2015-08-29 ENCOUNTER — Ambulatory Visit (HOSPITAL_COMMUNITY)
Admission: RE | Admit: 2015-08-29 | Discharge: 2015-08-29 | Disposition: A | Discharge status: Home / Self Care | Payer: Medicare Other | Source: Ambulatory Visit | Attending: Cardiology | Admitting: Cardiology

## 2015-08-29 DIAGNOSIS — D649 Anemia, unspecified: Secondary | ICD-10-CM | POA: Diagnosis not present

## 2015-08-29 DIAGNOSIS — I083 Combined rheumatic disorders of mitral, aortic and tricuspid valves: Secondary | ICD-10-CM | POA: Diagnosis not present

## 2015-08-29 DIAGNOSIS — Z950 Presence of cardiac pacemaker: Secondary | ICD-10-CM | POA: Diagnosis not present

## 2015-08-29 DIAGNOSIS — I4891 Unspecified atrial fibrillation: Secondary | ICD-10-CM | POA: Diagnosis not present

## 2015-08-29 DIAGNOSIS — N183 Chronic kidney disease, stage 3 (moderate): Secondary | ICD-10-CM | POA: Diagnosis not present

## 2015-08-29 DIAGNOSIS — I2510 Atherosclerotic heart disease of native coronary artery without angina pectoris: Secondary | ICD-10-CM | POA: Diagnosis not present

## 2015-08-29 DIAGNOSIS — E1122 Type 2 diabetes mellitus with diabetic chronic kidney disease: Secondary | ICD-10-CM | POA: Diagnosis not present

## 2015-08-29 DIAGNOSIS — I5023 Acute on chronic systolic (congestive) heart failure: Secondary | ICD-10-CM | POA: Diagnosis not present

## 2015-08-29 DIAGNOSIS — I129 Hypertensive chronic kidney disease with stage 1 through stage 4 chronic kidney disease, or unspecified chronic kidney disease: Secondary | ICD-10-CM | POA: Diagnosis not present

## 2015-08-29 DIAGNOSIS — E1140 Type 2 diabetes mellitus with diabetic neuropathy, unspecified: Secondary | ICD-10-CM | POA: Diagnosis not present

## 2015-08-29 LAB — PULMONARY FUNCTION TEST
DL/VA % PRED: 36 %
DL/VA: 1.53 ml/min/mmHg/L
DLCO UNC: 2.77 ml/min/mmHg
DLCO unc % pred: 14 %
FEF 25-75 POST: 2.38 L/s
FEF 25-75 Pre: 2.18 L/sec
FEF2575-%CHANGE-POST: 9 %
FEF2575-%PRED-POST: 216 %
FEF2575-%Pred-Pre: 197 %
FEV1-%CHANGE-POST: 5 %
FEV1-%PRED-PRE: 96 %
FEV1-%Pred-Post: 101 %
FEV1-POST: 1.53 L
FEV1-PRE: 1.45 L
FEV1FVC-%CHANGE-POST: -6 %
FEV1FVC-%PRED-PRE: 122 %
FEV6-%Change-Post: 15 %
FEV6-%PRED-POST: 93 %
FEV6-%Pred-Pre: 80 %
FEV6-PRE: 1.56 L
FEV6-Post: 1.81 L
FEV6FVC-%Change-Post: -1 %
FEV6FVC-%Pred-Post: 105 %
FEV6FVC-%Pred-Pre: 106 %
FVC-%CHANGE-POST: 12 %
FVC-%PRED-POST: 88 %
FVC-%PRED-PRE: 78 %
FVC-POST: 1.83 L
FVC-PRE: 1.62 L
POST FEV1/FVC RATIO: 84 %
PRE FEV6/FVC RATIO: 100 %
Post FEV6/FVC ratio: 99 %
Pre FEV1/FVC ratio: 89 %
RV % PRED: 94 %
RV: 2.09 L
TLC % pred: 92 %
TLC: 4.13 L

## 2015-08-29 MED ORDER — ALBUTEROL SULFATE (2.5 MG/3ML) 0.083% IN NEBU
2.5000 mg | INHALATION_SOLUTION | Freq: Once | RESPIRATORY_TRACT | Status: AC
  Administered 2015-08-29: 2.5 mg via RESPIRATORY_TRACT

## 2015-08-29 NOTE — Telephone Encounter (Signed)
Daughter reported that the home health nurse said her mothers BP was 90/60 today.  Patient has no complaints associated with lower BP. Has been on increased metoprolol succinate 25mg  dose to twice a day on 08/21/2015   She is going to hold the second dose of the day.

## 2015-08-29 NOTE — Telephone Encounter (Signed)
Called [name] patients daughter and instructed her to take her BP once daily and if her BP stays consistently low to notify the office. Daughter took blood pressure a little while ago and it was 115/72 Will continue medication as prescribed

## 2015-08-31 ENCOUNTER — Other Ambulatory Visit: Payer: Self-pay | Admitting: Family Medicine

## 2015-08-31 ENCOUNTER — Encounter: Payer: Self-pay | Admitting: Family Medicine

## 2015-08-31 DIAGNOSIS — M1000 Idiopathic gout, unspecified site: Secondary | ICD-10-CM

## 2015-08-31 DIAGNOSIS — E119 Type 2 diabetes mellitus without complications: Secondary | ICD-10-CM

## 2015-08-31 DIAGNOSIS — D649 Anemia, unspecified: Secondary | ICD-10-CM

## 2015-09-02 ENCOUNTER — Telehealth (HOSPITAL_COMMUNITY): Payer: Self-pay | Admitting: *Deleted

## 2015-09-02 DIAGNOSIS — D649 Anemia, unspecified: Secondary | ICD-10-CM | POA: Diagnosis not present

## 2015-09-02 DIAGNOSIS — I083 Combined rheumatic disorders of mitral, aortic and tricuspid valves: Secondary | ICD-10-CM | POA: Diagnosis not present

## 2015-09-02 DIAGNOSIS — I4891 Unspecified atrial fibrillation: Secondary | ICD-10-CM | POA: Diagnosis not present

## 2015-09-02 DIAGNOSIS — I5023 Acute on chronic systolic (congestive) heart failure: Secondary | ICD-10-CM | POA: Diagnosis not present

## 2015-09-02 DIAGNOSIS — E1140 Type 2 diabetes mellitus with diabetic neuropathy, unspecified: Secondary | ICD-10-CM | POA: Diagnosis not present

## 2015-09-02 DIAGNOSIS — I2510 Atherosclerotic heart disease of native coronary artery without angina pectoris: Secondary | ICD-10-CM | POA: Diagnosis not present

## 2015-09-02 DIAGNOSIS — Z950 Presence of cardiac pacemaker: Secondary | ICD-10-CM | POA: Diagnosis not present

## 2015-09-02 DIAGNOSIS — E1122 Type 2 diabetes mellitus with diabetic chronic kidney disease: Secondary | ICD-10-CM | POA: Diagnosis not present

## 2015-09-02 DIAGNOSIS — I129 Hypertensive chronic kidney disease with stage 1 through stage 4 chronic kidney disease, or unspecified chronic kidney disease: Secondary | ICD-10-CM | POA: Diagnosis not present

## 2015-09-02 DIAGNOSIS — N183 Chronic kidney disease, stage 3 (moderate): Secondary | ICD-10-CM | POA: Diagnosis not present

## 2015-09-02 NOTE — Telephone Encounter (Signed)
Danielle Benitez, Danielle Benitez, Danielle Benitez, Danielle Benitez: Mrs [name] needs a BMET, could you have her get one done some time this week? Thanks.     Arranged for [facility] to draw bmet this week

## 2015-09-03 ENCOUNTER — Other Ambulatory Visit: Payer: Self-pay | Admitting: *Deleted

## 2015-09-03 LAB — CUP PACEART REMOTE DEVICE CHECK
Battery Voltage: 2.99 V
Brady Statistic AP VP Percent: 0.17 %
Brady Statistic RA Percent Paced: 97.51 %
Date Time Interrogation Session: 20161115014307
Implantable Lead Implant Date: 20130402
Implantable Lead Location: 753859
Implantable Lead Model: 5086
Implantable Lead Model: 5086
Lead Channel Sensing Intrinsic Amplitude: 2.717 mV
Lead Channel Setting Pacing Pulse Width: 0.4 ms
Lead Channel Setting Sensing Sensitivity: 0.9 mV
MDC IDC LEAD IMPLANT DT: 20130402
MDC IDC LEAD LOCATION: 753860
MDC IDC MSMT LEADCHNL RA IMPEDANCE VALUE: 356 Ohm
MDC IDC MSMT LEADCHNL RV IMPEDANCE VALUE: 504 Ohm
MDC IDC MSMT LEADCHNL RV SENSING INTR AMPL: 2.775 mV
MDC IDC SET LEADCHNL RA PACING AMPLITUDE: 2 V
MDC IDC SET LEADCHNL RV PACING AMPLITUDE: 2.5 V
MDC IDC STAT BRADY AP VS PERCENT: 97.34 %
MDC IDC STAT BRADY AS VP PERCENT: 0.25 %
MDC IDC STAT BRADY AS VS PERCENT: 2.24 %
MDC IDC STAT BRADY RV PERCENT PACED: 0.42 %

## 2015-09-03 NOTE — Patient Outreach (Signed)
Transition of care call (week 4, discharged 10/31):  Pt reports doing good, HH PT helping her a lot, walking in her house now without a walker.  Pt states weights are good now, staying at 143 lbs.  Pt reports BP is normal now, ranging 126/136 over 70's.   Pt states sugars are running high 150-170, went to new Primary Care MD - said  it would take awhile to adjust.   RN CM reminded pt of ongoing compliance with Low Na+, diabetic diet during the holiday to which pt said will be hard, voiced understanding.    As discussed with pt, plan to f/u again telephonically 12/1- final transition of care call to which pt agreed.   Danielle Benitez.   Danielle Fouty RN CCM [facility] Care Management  [phone]

## 2015-09-05 DIAGNOSIS — I4891 Unspecified atrial fibrillation: Secondary | ICD-10-CM | POA: Diagnosis not present

## 2015-09-05 DIAGNOSIS — I083 Combined rheumatic disorders of mitral, aortic and tricuspid valves: Secondary | ICD-10-CM | POA: Diagnosis not present

## 2015-09-05 DIAGNOSIS — E1140 Type 2 diabetes mellitus with diabetic neuropathy, unspecified: Secondary | ICD-10-CM | POA: Diagnosis not present

## 2015-09-05 DIAGNOSIS — N183 Chronic kidney disease, stage 3 (moderate): Secondary | ICD-10-CM | POA: Diagnosis not present

## 2015-09-05 DIAGNOSIS — I5023 Acute on chronic systolic (congestive) heart failure: Secondary | ICD-10-CM | POA: Diagnosis not present

## 2015-09-05 DIAGNOSIS — D649 Anemia, unspecified: Secondary | ICD-10-CM | POA: Diagnosis not present

## 2015-09-05 DIAGNOSIS — Z950 Presence of cardiac pacemaker: Secondary | ICD-10-CM | POA: Diagnosis not present

## 2015-09-05 DIAGNOSIS — I129 Hypertensive chronic kidney disease with stage 1 through stage 4 chronic kidney disease, or unspecified chronic kidney disease: Secondary | ICD-10-CM | POA: Diagnosis not present

## 2015-09-05 DIAGNOSIS — E1122 Type 2 diabetes mellitus with diabetic chronic kidney disease: Secondary | ICD-10-CM | POA: Diagnosis not present

## 2015-09-05 DIAGNOSIS — I2510 Atherosclerotic heart disease of native coronary artery without angina pectoris: Secondary | ICD-10-CM | POA: Diagnosis not present

## 2015-09-08 ENCOUNTER — Encounter (HOSPITAL_COMMUNITY): Payer: Medicare Other

## 2015-09-08 ENCOUNTER — Telehealth: Payer: Self-pay

## 2015-09-08 NOTE — Telephone Encounter (Signed)
Danielle Benitez said [facility] drew stat labs on 09/05/15 and sent lab specimen to [facility]. Do not see report in chart. Danielle Benitez request cb when gets lab results.

## 2015-09-08 NOTE — Telephone Encounter (Signed)
Camelia Eng- will you please check on these results?  Thanks.

## 2015-09-09 ENCOUNTER — Encounter: Payer: Self-pay | Admitting: Cardiology

## 2015-09-09 NOTE — Telephone Encounter (Signed)
Received a faxed copy, in your in box

## 2015-09-10 ENCOUNTER — Telehealth (HOSPITAL_COMMUNITY): Payer: Self-pay

## 2015-09-10 NOTE — Telephone Encounter (Signed)
She doesn't have K rx'd.

## 2015-09-10 NOTE — Telephone Encounter (Signed)
Patient given detailed instructions per Myocardial Perfusion Study Information Sheet for the test on 09-12-2015 at 0745. Patient notified to arrive 15 minutes early and that it is imperative to arrive on time for appointment to keep from having the test rescheduled.  If you need to cancel or reschedule your appointment, please call the office within 24 hours of your appointment. Failure to do so may result in a cancellation of your appointment, and a $50 no show fee. Patient verbalized understanding.Irean Hong A,RN

## 2015-09-10 NOTE — Telephone Encounter (Signed)
Patient's daughter [name]) notified as instructed by telephone. Was advised by [name] that she is not sure when patient's last A1c was last checked by her previously doctor. Checking back in EMR the last one listed was July 2016. [name] stated that she will get a lab appointment schedule for her mom.

## 2015-09-10 NOTE — Telephone Encounter (Signed)
Labs ok but make sure she is not on any supplemental K with K = 5.

## 2015-09-10 NOTE — Telephone Encounter (Signed)
Glucose- 217 BUN- 31 Cr- 1.42 Na- 142 K- 5 CO2- 23  This was per Dr. Shirlee Latch.  I don't know if he got a copy, so I'll route to him for management.   [place]- In meantime, it looks like her A1c, CBC, iron and uric acid labs didn't get drawn.  She needs lab visit for that if not prev done.   Thanks.

## 2015-09-11 ENCOUNTER — Encounter: Payer: Self-pay | Admitting: Family Medicine

## 2015-09-11 ENCOUNTER — Other Ambulatory Visit: Payer: Self-pay | Admitting: *Deleted

## 2015-09-11 LAB — GLUCOSE (CC13)
BUN: 31
CREATININE: 1.42
Carbon Dioxide, Total: 23
Chloride, Serum: 101
GLUCOSE: 217
Potassium, serum: 5
Sodium, serum: 142

## 2015-09-11 NOTE — Patient Outreach (Signed)
Final transition of care call (discharged 10/31):  Pt reports still taking exercises (HHPT), HH RN to f/u for 3 months.  Pt reports weight staying at 143 lbs., BP today 117/67 and heart rate 75.   Pt states blood sugar today is 152, ranging 140-162).   Pt states compliance with diet is rough, told potassium is high so need to cut down on potassium as well as  watch salt.   Pt states she is to have a stress test tomorrow.   Pt states to f/u with Primary Care MD soon.   Discussed with pt doing a home visit next week, provide/review diet low in potassium to which pt said she could use.    Plan to f/u with pt 12/6- home visit.     Shayne Alken.   Pierzchala RN CCM [facility] Care Management  [phone]

## 2015-09-12 ENCOUNTER — Ambulatory Visit (HOSPITAL_COMMUNITY)
Admission: RE | Admit: 2015-09-12 | Discharge: 2015-09-12 | Disposition: A | Discharge status: Home / Self Care | Payer: Medicare Other | Source: Ambulatory Visit | Attending: Cardiology | Admitting: Cardiology

## 2015-09-12 ENCOUNTER — Ambulatory Visit (HOSPITAL_BASED_OUTPATIENT_CLINIC_OR_DEPARTMENT_OTHER): Payer: Medicare Other

## 2015-09-12 VITALS — BP 128/78 | HR 74 | Ht 60.0 in | Wt 155.0 lb

## 2015-09-12 DIAGNOSIS — E119 Type 2 diabetes mellitus without complications: Secondary | ICD-10-CM

## 2015-09-12 DIAGNOSIS — Z7902 Long term (current) use of antithrombotics/antiplatelets: Secondary | ICD-10-CM | POA: Diagnosis not present

## 2015-09-12 DIAGNOSIS — E1122 Type 2 diabetes mellitus with diabetic chronic kidney disease: Secondary | ICD-10-CM | POA: Diagnosis not present

## 2015-09-12 DIAGNOSIS — E785 Hyperlipidemia, unspecified: Secondary | ICD-10-CM | POA: Diagnosis not present

## 2015-09-12 DIAGNOSIS — N183 Chronic kidney disease, stage 3 unspecified: Secondary | ICD-10-CM

## 2015-09-12 DIAGNOSIS — I429 Cardiomyopathy, unspecified: Secondary | ICD-10-CM | POA: Diagnosis not present

## 2015-09-12 DIAGNOSIS — I2510 Atherosclerotic heart disease of native coronary artery without angina pectoris: Secondary | ICD-10-CM | POA: Diagnosis not present

## 2015-09-12 DIAGNOSIS — I5022 Chronic systolic (congestive) heart failure: Secondary | ICD-10-CM | POA: Insufficient documentation

## 2015-09-12 DIAGNOSIS — M109 Gout, unspecified: Secondary | ICD-10-CM | POA: Insufficient documentation

## 2015-09-12 DIAGNOSIS — I480 Paroxysmal atrial fibrillation: Secondary | ICD-10-CM | POA: Diagnosis not present

## 2015-09-12 DIAGNOSIS — Z950 Presence of cardiac pacemaker: Secondary | ICD-10-CM | POA: Insufficient documentation

## 2015-09-12 DIAGNOSIS — D649 Anemia, unspecified: Secondary | ICD-10-CM

## 2015-09-12 DIAGNOSIS — Z79899 Other long term (current) drug therapy: Secondary | ICD-10-CM | POA: Insufficient documentation

## 2015-09-12 DIAGNOSIS — M1000 Idiopathic gout, unspecified site: Secondary | ICD-10-CM | POA: Diagnosis not present

## 2015-09-12 DIAGNOSIS — I130 Hypertensive heart and chronic kidney disease with heart failure and stage 1 through stage 4 chronic kidney disease, or unspecified chronic kidney disease: Secondary | ICD-10-CM | POA: Diagnosis not present

## 2015-09-12 LAB — MYOCARDIAL PERFUSION IMAGING
CHL CUP NUCLEAR SDS: 7
CHL CUP NUCLEAR SRS: 7
CHL CUP NUCLEAR SSS: 14
LV dias vol: 140 mL
LV sys vol: 96 mL
Peak HR: 73 {beats}/min
RATE: 0.3
Rest HR: 72 {beats}/min
TID: 1.02

## 2015-09-12 LAB — CBC WITH DIFFERENTIAL/PLATELET
BASOS ABS: 0 10*3/uL (ref 0.0–0.1)
BASOS PCT: 0 %
EOS ABS: 0.1 10*3/uL (ref 0.0–0.7)
Eosinophils Relative: 2 %
HCT: 41.9 % (ref 36.0–46.0)
Hemoglobin: 13.7 g/dL (ref 12.0–15.0)
LYMPHS PCT: 40 %
Lymphs Abs: 2.4 10*3/uL (ref 0.7–4.0)
MCH: 27.8 pg (ref 26.0–34.0)
MCHC: 32.7 g/dL (ref 30.0–36.0)
MCV: 85.2 fL (ref 78.0–100.0)
MONOS PCT: 5 %
Monocytes Absolute: 0.3 10*3/uL (ref 0.1–1.0)
NEUTROS PCT: 53 %
Neutro Abs: 3.2 10*3/uL (ref 1.7–7.7)
PLATELETS: 123 10*3/uL — AB (ref 150–400)
RBC: 4.92 MIL/uL (ref 3.87–5.11)
RDW: 22.5 % — ABNORMAL HIGH (ref 11.5–15.5)
WBC: 6 10*3/uL (ref 4.0–10.5)

## 2015-09-12 LAB — BASIC METABOLIC PANEL
ANION GAP: 10 (ref 5–15)
BUN: 27 mg/dL — ABNORMAL HIGH (ref 6–20)
CHLORIDE: 103 mmol/L (ref 101–111)
CO2: 22 mmol/L (ref 22–32)
Calcium: 9.3 mg/dL (ref 8.9–10.3)
Creatinine, Ser: 1.64 mg/dL — ABNORMAL HIGH (ref 0.44–1.00)
GFR calc Af Amer: 33 mL/min — ABNORMAL LOW (ref >60)
GFR, EST NON AFRICAN AMERICAN: 28 mL/min — AB (ref >60)
GLUCOSE: 252 mg/dL — AB (ref 65–99)
POTASSIUM: 5.3 mmol/L — AB (ref 3.5–5.1)
Sodium: 135 mmol/L (ref 135–145)

## 2015-09-12 LAB — BRAIN NATRIURETIC PEPTIDE: B NATRIURETIC PEPTIDE 5: 545.7 pg/mL — AB (ref 0.0–100.0)

## 2015-09-12 LAB — URIC ACID: Uric Acid, Serum: 3.3 mg/dL (ref 2.3–6.6)

## 2015-09-12 MED ORDER — TECHNETIUM TC 99M SESTAMIBI GENERIC - CARDIOLITE
10.1000 | Freq: Once | INTRAVENOUS | Status: AC | PRN
  Administered 2015-09-12: 10.1 via INTRAVENOUS

## 2015-09-12 MED ORDER — REGADENOSON 0.4 MG/5ML IV SOLN
0.4000 mg | Freq: Once | INTRAVENOUS | Status: AC
  Administered 2015-09-12: 0.4 mg via INTRAVENOUS

## 2015-09-12 MED ORDER — SPIRONOLACTONE 25 MG PO TABS: 12.5000 mg | ORAL_TABLET | Freq: Every day | ORAL | Status: DC

## 2015-09-12 MED ORDER — METOPROLOL SUCCINATE ER 50 MG PO TB24: 50.0000 mg | ORAL_TABLET | Freq: Two times a day (BID) | ORAL | Status: DC

## 2015-09-12 MED ORDER — TECHNETIUM TC 99M SESTAMIBI GENERIC - CARDIOLITE
31.6000 | Freq: Once | INTRAVENOUS | Status: AC | PRN
  Administered 2015-09-12: 32 via INTRAVENOUS

## 2015-09-12 NOTE — Patient Instructions (Signed)
INCREASE Metoprolol XL 50 mg, one tab twice a day DECREASE Spironolactone to 25mg , 1/2 tab daily  Labs today  You have been referred to Cardiac Rehab, they will contact you to arrange orientation  Your physician recommends that you schedule a follow-up appointment in: 1 month with echo  Your physician has requested that you have an echocardiogram. Echocardiography is a painless test that uses sound waves to create images of your heart. It provides your doctor with information about the size and shape of your heart and how well your heart's chambers and valves are working. This procedure takes approximately one hour. There are no restrictions for this procedure.  Do the following things EVERYDAY: 1) Weigh yourself in the morning before breakfast. Write it down and keep it in a log. 2) Take your medicines as prescribed 3) Eat low salt foods-Limit salt (sodium) to 2000 mg per day.  4) Stay as active as you can everyday 5) Limit all fluids for the day to less than 2 liters 6)

## 2015-09-13 LAB — HEMOGLOBIN A1C
Hgb A1c MFr Bld: 7.7 % — ABNORMAL HIGH (ref 4.8–5.6)
Mean Plasma Glucose: 174 mg/dL

## 2015-09-14 NOTE — Progress Notes (Signed)
Patient ID: Danielle Benitez, female   DOB: 10/25/1931, 81 y.o.   MRN: [id number] PCP: Dr. Para March HF Cardiology: Dr. Shirlee Latch  81 yo with history of HTN, orthostatic hypotension, symptomatic bradycardia with Medtronic PPM, paroxysmal atrial fibrillation chronic systolic CHF, CKD stage III, and iron deficiency anemia presents for cardiology followup. Around the beginning of 9/16, she began to develop exertional dyspnea. She was seen in the cardiology office on 07/25/15. She was in atrial fibrillation with RVR and echo showed EF 35-40% (had been 50-55% in past). PPM interrogation showed that atrial fibrillation had begun 07/11/15, so exertional dyspnea had preceded this by about 4 weeks. She was admitted from 10/7-10/17. She was anemic with hemoglobin 8.7. She was heme negative but Fe deficient. EGD was not done. She had TEE-guided DCCV to NSR. She also had AKI with creatinine up to 3.41, but it trended back down. She was sent home in NSR. She was readmitted 10/19-10/23 with recurrent atrial fibrillation and acute on chronic systolic CHF. She was diuresed about 10 lbs. It was decided to rate control her rather than repeat cardioversion, and she was sent home. she was only at home for about a day and developed increased dyspnea so came back to the ER and was re-admitted.  She was diuresed with IV Lasix and started on amiodarone.  She underwent DCCV on 10/26 but atrial fibrillation recurred on 10/29.  More amiodarone was loaded, and she went back into NSR.   She comes today for followup.  Lexiscan Cardiolite in 11/16 showed basal inferior/inferolatearl fixed defect consistent with infarction but no ischemia.  She is doing well overall.  She remains in NSR on amiodarone.  She has fatigue and mild dyspnea after walking 100 yards or walking fast.  Overall, this is improved.  No orthopnea/PND.  No lightheadedness, syncope, or falls.  No chest pain.  No tachypalpitations.  I decreased her Lasix because of rise in  creatinine.  Weight has been stable on her home scale (weights daily).   ECG: a-paced, RBBB  Labs (7/16): LDL 48, HDL 55 Labs (10/16): K 3.9, creatinine 2.08 Labs (11/16): K 4.9, creatinine 2.5, LFTs normal, TSH normal, HCT 43.6  PMH: 1. HTN but also with history of orthostatic hypotension. 2. CKD stage III 3. Aortic stenosis: Mild 4. Symptomatic bradycardia s/p Medtronic PPM. 5. Hyperlipidemia 6. Atrial fibrillation: Paroxysmal. TEE-guided DCCV in 10/16. She had DCCV again latera in 10/16 and was started on amiodarone.   - PFTs (11/16) with normal spirometry, severe diffusion defect c/w pulmonary vascular disease.  7. Type II diabetes 8. Gout 9. Fe-deficiency anemia 10. Chronic systolic CHF: Echo 11/14 with EF 50-55%. Echo (10/16) with EF 35-40%, moderate LV dilation, diffuse hypokinesis, mild AS, moderate MR, PASP 48 mmHg.  Lexiscan Cardiolite (11/16) with EF 31%, basal inferior and inferolateral fixed defect possibly consistent with infarction, no ischemia; degree of LV hypokinesis was out of proportion to the perfusion defect.  11. Esophageal dysmotility  SH: Lives alone but daughter is nearby.  Nonsmoker. No ETOH.    FH: No premature CAD.   ROS: All systems reviewed and negative except as per HPI.    Current Outpatient Prescriptions  Medication Sig Dispense Refill  . acetaminophen (TYLENOL) 325 MG tablet Take 2 tablets (650 mg total) by mouth every 4 (four) hours as needed for headache or mild pain.    Marland Kitchen albuterol (PROVENTIL HFA;VENTOLIN HFA) 108 (90 BASE) MCG/ACT inhaler Inhale 2 puffs into the lungs every 6 (six) hours as needed for  wheezing or shortness of breath. 1 Inhaler 0  . allopurinol (ZYLOPRIM) 300 MG tablet Take 1 tablet (300 mg total) by mouth daily. 90 tablet 3  . amiodarone (PACERONE) 200 MG tablet Take 200 mg by mouth daily.    Marland Kitchen apixaban (ELIQUIS) 2.5 MG TABS tablet Take 1 tablet (2.5 mg total) by mouth 2 (two) times daily. 60 tablet 11  . atorvastatin  (LIPITOR) 20 MG tablet Take 1 tablet (20 mg total) by mouth daily at 6 PM. 30 tablet 6  . cetirizine (ZYRTEC) 10 MG tablet Take 10 mg by mouth daily.    . DULoxetine (CYMBALTA) 20 MG capsule Take 1 capsule (20 mg total) by mouth at bedtime. Take 1 tablet by mouth daily to help treat anxiety and nerves. 90 capsule 3  . furosemide (LASIX) 40 MG tablet Take 1 tablet (40 mg total) by mouth daily. 60 tablet 6  . iron polysaccharides (NU-IRON) 150 MG capsule Take 1 capsule (150 mg total) by mouth 2 (two) times daily. 60 capsule 3  . metoprolol succinate (TOPROL-XL) 50 MG 24 hr tablet Take 1 tablet (50 mg total) by mouth 2 (two) times daily. 60 tablet 3  . pantoprazole (PROTONIX) 40 MG tablet Take 1 tablet (40 mg total) by mouth daily. 30 tablet 3  . saxagliptin HCl (ONGLYZA) 2.5 MG TABS tablet Take 1 tablet (2.5 mg total) by mouth daily. Take one tablet by mouth once daily for blood sugar 30 tablet 2  . senna-docusate (SENOKOT-S) 8.6-50 MG tablet Take 1 tablet by mouth daily as needed for mild constipation.    Marland Kitchen spironolactone (ALDACTONE) 25 MG tablet Take 0.5 tablets (12.5 mg total) by mouth daily. 15 tablet 6  . traZODone (DESYREL) 50 MG tablet Take 1 tablet (50 mg total) by mouth at bedtime as needed for sleep. 20 tablet 0  . triamcinolone (KENALOG) 0.025 % cream Apply 1 application topically 4 (four) times daily as needed (itching).     No current facility-administered medications for this encounter.   BP 128/78 mmHg  Pulse 74  Ht 5' (1.524 m)  Wt 155 lb (70.308 kg)  BMI 30.27 kg/m2  SpO2 97% General: NAD Neck: No JVD, no thyromegaly or thyroid nodule.  Lungs: Clear to auscultation bilaterally with normal respiratory effort. CV: Nondisplaced PMI.  Heart regular S1/S2, no S3/S4, no murmur.  No peripheral edema.  No carotid bruit.  Normal pedal pulses.  Abdomen: Soft, nontender, no hepatosplenomegaly, no distention.  Skin: Intact without lesions or rashes.  Neurologic: Alert and oriented x 3.   Psych: Normal affect. Extremities: No clubbing or cyanosis.  HEENT: Normal.   Assessment/Plan: 1. Chronic systolic CHF: EF 24-09% by echo 10/16. Etiology uncertain: could be tachy-mediated cardiomyopathy, but symptoms seem to predate the onset of atrial fibrillation. Possible viral myocarditis. Cannot rule out ischemic cardiomyopathy, but no definite anginal symptoms. Cardiolite in 11/16 showed a small area possibly of prior infarction but EF and wall motion abnormalities were out of proportion to the perfusion defect. NYHA class II-III symptoms.  She is not significantly volume overloaded on exam despite recently decreasing Lasix with elevated creatinine.  - Continue Lasix 40 mg po daily.  - With rise in creatinine and K on the higher side, decrease spironolactone to 12.5 mg daily.  - Increase Toprol XL to 50 mg bid.  - Hold off on ACEI for now until we see that creatinine has improved.  - Cardiac rehab referral for CHF - Repeat echo in 1/16, hopefully will show improvement  now that in NSR.   2. Atrial fibrillation: Tolerates poorly. A-paced s/p DCCV 08/06/15.  - Continue Eliquis - Toprol XL to be increased to 50 mg bid.  - Continue amiodarone for maintenance of NSR.  Recent LFTs/TSH normal.  She will need a regular eye exam.   3. CKD Stage III: Recent rise in creatinine, Lasix cut back and will decrease spironolactone to 12.5 mg daily today.  4. Medtronic PPM for symptomatic bradycardia. 5. CAD: Possible old infarction on Cardiolite.  No ischemia.  Would not cath with significant CKD and no chest pain.  Continue statin, good lipids in 7/16.  No aspirin given Eliquis use.   [name] 09/14/2015

## 2015-09-16 ENCOUNTER — Ambulatory Visit: Payer: Medicare Other | Admitting: *Deleted

## 2015-09-16 ENCOUNTER — Other Ambulatory Visit: Payer: Self-pay | Admitting: *Deleted

## 2015-09-16 NOTE — Patient Outreach (Signed)
[facility]) Care Management   09/16/2015  Danielle Benitez Nov 25, 1931 [id number]  Danielle Benitez is an 81 y.o. female  Subjective:  Pt reports saw Dr. Aundra Dubin 12/2, labs done- told Potassium was high. Pt states was told A1C  Was fine.   Pt reports her daughter is helping her avoid foods high in potassium.  Pt states to f/u again with Dr. Aundra Dubin 10/13/15.  Pt reports MD talk to her about cardiac rehab starting in January.  Pt states weights are holding at 143 lbs., today BP 140/78, sugar 142 (ranging 130-182).     Objective:   Filed Vitals:   09/16/15 1414  BP: 104/64  Pulse: 71  Resp: 20    ROS  Physical Exam  Constitutional: She is oriented to person, place, and time. She appears well-developed and well-nourished.  Cardiovascular: Normal rate and regular rhythm.   Respiratory: Breath sounds normal.  GI: Soft.  Musculoskeletal: Normal range of motion.  Neurological: She is alert and oriented to person, place, and time.  Skin: Skin is warm and dry.  Psychiatric: She has a normal mood and affect. Her behavior is normal. Judgment and thought content normal.    Current Medications:  Reviewed with pt  Current Outpatient Prescriptions  Medication Sig Dispense Refill  . acetaminophen (TYLENOL) 325 MG tablet Take 2 tablets (650 mg total) by mouth every 4 (four) hours as needed for headache or mild pain.    Marland Kitchen albuterol (PROVENTIL HFA;VENTOLIN HFA) 108 (90 BASE) MCG/ACT inhaler Inhale 2 puffs into the lungs every 6 (six) hours as needed for wheezing or shortness of breath. 1 Inhaler 0  . allopurinol (ZYLOPRIM) 300 MG tablet Take 1 tablet (300 mg total) by mouth daily. 90 tablet 3  . amiodarone (PACERONE) 200 MG tablet Take 200 mg by mouth daily.    Marland Kitchen apixaban (ELIQUIS) 2.5 MG TABS tablet Take 1 tablet (2.5 mg total) by mouth 2 (two) times daily. 60 tablet 11  . atorvastatin (LIPITOR) 20 MG tablet Take 1 tablet (20 mg total) by mouth daily at 6 PM. 30 tablet 6  . DULoxetine  (CYMBALTA) 20 MG capsule Take 1 capsule (20 mg total) by mouth at bedtime. Take 1 tablet by mouth daily to help treat anxiety and nerves. 90 capsule 3  . furosemide (LASIX) 40 MG tablet Take 1 tablet (40 mg total) by mouth daily. 60 tablet 6  . iron polysaccharides (NU-IRON) 150 MG capsule Take 1 capsule (150 mg total) by mouth 2 (two) times daily. 60 capsule 3  . metoprolol succinate (TOPROL-XL) 50 MG 24 hr tablet Take 1 tablet (50 mg total) by mouth 2 (two) times daily. 60 tablet 3  . pantoprazole (PROTONIX) 40 MG tablet Take 1 tablet (40 mg total) by mouth daily. 30 tablet 3  . saxagliptin HCl (ONGLYZA) 2.5 MG TABS tablet Take 1 tablet (2.5 mg total) by mouth daily. Take one tablet by mouth once daily for blood sugar 30 tablet 2  . senna-docusate (SENOKOT-S) 8.6-50 MG tablet Take 1 tablet by mouth daily as needed for mild constipation.    Marland Kitchen spironolactone (ALDACTONE) 25 MG tablet Take 0.5 tablets (12.5 mg total) by mouth daily. 15 tablet 6  . traZODone (DESYREL) 50 MG tablet Take 1 tablet (50 mg total) by mouth at bedtime as needed for sleep. 20 tablet 0  . triamcinolone (KENALOG) 0.025 % cream Apply 1 application topically 4 (four) times daily as needed (itching).    . cetirizine (ZYRTEC) 10 MG tablet  Take 10 mg by mouth daily.     No current facility-administered medications for this visit.    Functional Status:   In your present state of health, do you have any difficulty performing the following activities: 08/21/2015 08/05/2015  Hearing? Y N  Vision? N N  Difficulty concentrating or making decisions? N N  Walking or climbing stairs? Y Y  Dressing or bathing? Y N  Doing errands, shopping? Tempie Donning  Preparing Food and eating ? Y -  Using the Toilet? N -  In the past six months, have you accidently leaked urine? Y -  Do you have problems with loss of bowel control? Y -  Managing your Medications? Y -  Managing your Finances? Y -  Housekeeping or managing your Housekeeping? Y -     Fall/Depression Screening:    PHQ 2/9 Scores 08/21/2015 05/06/2015 12/19/2014 08/14/2014 02/27/2014 11/20/2013  PHQ - 2 Score 1 0 0 0 0 1    Assessment:   HF- weight stable, as reported by pt  staying at 143 lbs. No edema, lungs clear.  Pt reports sob when she overdoes it, no chest pain.                            Diet education- reinforcement needed to continue to comply with Low Na+, low Potassium diet.                                   RN CM provided/reviewed ( Emmi)information on Low Na+ as well as information on foods                                  Low in potassium.   Plan:   Pt to f/u with Dr. Aundra Dubin 10/13/15             Plan to discharge pt from community nurse case management services, goals met.             Plan to inform Dr. Damita Dunnings of discharge by sending case closure letter by in  basket in [place] to inform [facility] care management assistant) by in basket in Epic  to close case.    [facility] CM Care Plan Problem One        Most Recent Value   Care Plan Problem One  Diet education- low potassium, low salt    Role Documenting the Problem One  Care Management Coordinator   Care Plan for Problem One  Active   [facility] CM Short Term Goal #1 (0-30 days)  Pt would have better understanding of low Potassium, low salt diet today    [facility] CM Short Term Goal #1 Start Date  09/16/15   [facility] CM Short Term Goal #1 Met Date  09/16/15   Interventions for Short Term Goal #1  provided/reviewed with pt  Emmi information- Low Na+ diet as well as information on foods to eat that are low in potassium.        Zara Chess.   [facility]  [phone]

## 2015-09-17 ENCOUNTER — Encounter: Payer: Self-pay | Admitting: *Deleted

## 2015-09-17 ENCOUNTER — Telehealth (HOSPITAL_COMMUNITY): Payer: Self-pay | Admitting: *Deleted

## 2015-09-17 ENCOUNTER — Other Ambulatory Visit: Payer: Self-pay | Admitting: *Deleted

## 2015-09-17 DIAGNOSIS — I5022 Chronic systolic (congestive) heart failure: Secondary | ICD-10-CM

## 2015-09-17 NOTE — Telephone Encounter (Signed)
Notes Recorded by Noralee Space, RN on 09/17/2015 at 2:17 PM [name] daughter aware, states Danielle Benitez was decreased, order placed for repeat bmet week of 12/12 at PCP office

## 2015-09-17 NOTE — Telephone Encounter (Signed)
-----   Message from Laurey Morale, MD sent at 09/12/2015  4:43 PM EST ----- We cut spironolactone in half, repeat BMET in 2 wks.

## 2015-09-23 ENCOUNTER — Encounter: Payer: Self-pay | Admitting: Cardiology

## 2015-09-24 ENCOUNTER — Other Ambulatory Visit (INDEPENDENT_AMBULATORY_CARE_PROVIDER_SITE_OTHER): Payer: Medicare Other

## 2015-09-24 DIAGNOSIS — I5022 Chronic systolic (congestive) heart failure: Secondary | ICD-10-CM

## 2015-09-25 ENCOUNTER — Telehealth: Payer: Self-pay

## 2015-09-25 LAB — BASIC METABOLIC PANEL
BUN: 29 mg/dL — AB (ref 6–23)
CO2: 33 mEq/L — ABNORMAL HIGH (ref 19–32)
Calcium: 9.4 mg/dL (ref 8.4–10.5)
Chloride: 97 mEq/L (ref 96–112)
Creatinine, Ser: 1.73 mg/dL — ABNORMAL HIGH (ref 0.40–1.20)
GFR: 29.99 mL/min — AB (ref >60.00)
Glucose, Bld: 263 mg/dL — ABNORMAL HIGH (ref 70–99)
POTASSIUM: 5.2 meq/L — AB (ref 3.5–5.1)
SODIUM: 136 meq/L (ref 135–145)

## 2015-09-25 NOTE — Telephone Encounter (Signed)
Pt has beginnings of non prod cough,headache and since pt has newly diagnosed cardiac issues;no fever or other symptoms at this time. [name] wants to know what OTC meds pt can safely take. Is Delsym OK for cough and should pt be taking a decongestant?

## 2015-09-25 NOTE — Telephone Encounter (Signed)
Delsym should be okay to take.  Can also take plain guaifenesin if needed.  I would avoid combination meds with decongestants (usually with suffix -D).  Thanks.

## 2015-09-25 NOTE — Telephone Encounter (Signed)
Daughter advised.

## 2015-09-29 ENCOUNTER — Telehealth (HOSPITAL_COMMUNITY): Payer: Self-pay

## 2015-09-29 NOTE — Telephone Encounter (Signed)
I have called and left a message with Krishawna to inquire about participation in Pulmonary Rehab per Dr. Alford Highland referral. Will send letter in mail and follow up.

## 2015-10-08 ENCOUNTER — Encounter (HOSPITAL_COMMUNITY): Payer: Self-pay | Admitting: Cardiology

## 2015-10-10 ENCOUNTER — Telehealth: Payer: Self-pay

## 2015-10-10 NOTE — Telephone Encounter (Signed)
[name] pts daughter brought FBS readings from 09/08/15 thru 10/10/15. Copy of BS log in Dr Lianne Bushy in box.

## 2015-10-14 NOTE — Telephone Encounter (Signed)
These sugars are variable- none below 100.  Usually 140s up to 200s.   Was she fasting?  Is she still on the 2.5mg  of onglyza?  Let me know.  Thanks.

## 2015-10-14 NOTE — Telephone Encounter (Signed)
All readings were fasting.  Patient is still on Onglyza 2.5 mg.

## 2015-10-15 MED ORDER — GLIMEPIRIDE 1 MG PO TABS: 1.0000 mg | ORAL_TABLET | Freq: Every day | ORAL | Status: DC

## 2015-10-15 NOTE — Telephone Encounter (Signed)
Patient's daughter [name]) notified as instructed by telephone and verbalized understanding.

## 2015-10-15 NOTE — Telephone Encounter (Signed)
I would add on glimepiride 1mg  in the AM with breakfast.  If she is skipping breakfast, then don't take the medicine.   Please update me with her sugars in about 1-2 weeks. If sig low sugars, ie sx of hypoglycemia, then stop med and update me.   Continue onglyza as is.  Thanks.  rx sent.

## 2015-10-17 ENCOUNTER — Ambulatory Visit (INDEPENDENT_AMBULATORY_CARE_PROVIDER_SITE_OTHER): Payer: Medicare Other | Admitting: Family Medicine

## 2015-10-17 ENCOUNTER — Other Ambulatory Visit (HOSPITAL_COMMUNITY): Payer: Medicare Other

## 2015-10-17 ENCOUNTER — Encounter: Payer: Self-pay | Admitting: Family Medicine

## 2015-10-17 ENCOUNTER — Encounter (HOSPITAL_COMMUNITY): Payer: Medicare Other

## 2015-10-17 VITALS — BP 118/64 | HR 73 | Temp 97.5°F | Wt 154.0 lb

## 2015-10-17 DIAGNOSIS — M545 Low back pain, unspecified: Secondary | ICD-10-CM

## 2015-10-17 MED ORDER — ACETAMINOPHEN 500 MG PO TABS: 500.0000 mg | ORAL_TABLET | Freq: Three times a day (TID) | ORAL | Status: DC | PRN

## 2015-10-17 NOTE — Progress Notes (Signed)
Pre visit review using our clinic review tool, if applicable. No additional management support is needed unless otherwise documented below in the visit note.  Weight has generally been steady on AM checks.    L lower back pain.  Pain with ROM, ie rolling in the bed.  She feels it pull when rolling over.  Still with some L shoulder pain at baseline.  Occ has some R sided back pain at baseline.  That isn't new.  No FCNAVD.  Not burning with urination now.  Has some occ dysuria.  She has some incontinence.  No injury to explain her back pain.  No rash, no bruising locally.    Meds, vitals, and allergies reviewed.   ROS: See HPI.  Otherwise, noncontributory.  nad ncat rrr ctab L lower back ttp but no bruising or midline pain.  No cva pain L SLR neg.  Trace BLE edema.

## 2015-10-17 NOTE — Patient Instructions (Signed)
Likely a muscle strain.  1-2 extra strength tylenol every 8 hours as needed.  Keep using your walker.   If you don't hear anything about cardiac rehab soon then let me know.  Take care.  Glad to see you.

## 2015-10-20 NOTE — Assessment & Plan Note (Signed)
No emergent sx, likely a benign MSK strain.  D/w pt.  No ominous sx or findings.   Tylenol, ice, heat, see AVS.  Fu prn.

## 2015-10-24 ENCOUNTER — Other Ambulatory Visit: Payer: Self-pay | Admitting: Family Medicine

## 2015-10-24 MED ORDER — VITAMIN C 500 MG PO TABS: 500.0000 mg | ORAL_TABLET | Freq: Every day | ORAL | Status: DC

## 2015-10-31 ENCOUNTER — Encounter (HOSPITAL_COMMUNITY): Payer: Self-pay

## 2015-10-31 ENCOUNTER — Ambulatory Visit (HOSPITAL_BASED_OUTPATIENT_CLINIC_OR_DEPARTMENT_OTHER)
Admission: RE | Admit: 2015-10-31 | Discharge: 2015-10-31 | Disposition: A | Discharge status: Home / Self Care | Payer: Medicare Other | Source: Ambulatory Visit | Attending: Cardiology | Admitting: Cardiology

## 2015-10-31 ENCOUNTER — Ambulatory Visit (HOSPITAL_COMMUNITY)
Admission: RE | Admit: 2015-10-31 | Discharge: 2015-10-31 | Disposition: A | Discharge status: Home / Self Care | Payer: Medicare Other | Source: Ambulatory Visit | Attending: Family Medicine | Admitting: Family Medicine

## 2015-10-31 VITALS — BP 124/82 | HR 88 | Wt 156.2 lb

## 2015-10-31 DIAGNOSIS — E785 Hyperlipidemia, unspecified: Secondary | ICD-10-CM | POA: Diagnosis not present

## 2015-10-31 DIAGNOSIS — E119 Type 2 diabetes mellitus without complications: Secondary | ICD-10-CM | POA: Diagnosis not present

## 2015-10-31 DIAGNOSIS — I4510 Unspecified right bundle-branch block: Secondary | ICD-10-CM | POA: Diagnosis not present

## 2015-10-31 DIAGNOSIS — I5023 Acute on chronic systolic (congestive) heart failure: Secondary | ICD-10-CM | POA: Diagnosis not present

## 2015-10-31 DIAGNOSIS — I517 Cardiomegaly: Secondary | ICD-10-CM | POA: Insufficient documentation

## 2015-10-31 DIAGNOSIS — N183 Chronic kidney disease, stage 3 unspecified: Secondary | ICD-10-CM

## 2015-10-31 DIAGNOSIS — N189 Chronic kidney disease, unspecified: Secondary | ICD-10-CM | POA: Insufficient documentation

## 2015-10-31 DIAGNOSIS — I129 Hypertensive chronic kidney disease with stage 1 through stage 4 chronic kidney disease, or unspecified chronic kidney disease: Secondary | ICD-10-CM | POA: Insufficient documentation

## 2015-10-31 DIAGNOSIS — I358 Other nonrheumatic aortic valve disorders: Secondary | ICD-10-CM | POA: Insufficient documentation

## 2015-10-31 DIAGNOSIS — R9431 Abnormal electrocardiogram [ECG] [EKG]: Secondary | ICD-10-CM | POA: Diagnosis not present

## 2015-10-31 DIAGNOSIS — I440 Atrioventricular block, first degree: Secondary | ICD-10-CM | POA: Diagnosis not present

## 2015-10-31 DIAGNOSIS — I059 Rheumatic mitral valve disease, unspecified: Secondary | ICD-10-CM | POA: Insufficient documentation

## 2015-10-31 LAB — COMPREHENSIVE METABOLIC PANEL
ALT: 25 U/L (ref 14–54)
ANION GAP: 11 (ref 5–15)
AST: 41 U/L (ref 15–41)
Albumin: 3.6 g/dL (ref 3.5–5.0)
Alkaline Phosphatase: 114 U/L (ref 38–126)
BUN: 31 mg/dL — ABNORMAL HIGH (ref 6–20)
CHLORIDE: 99 mmol/L — AB (ref 101–111)
CO2: 28 mmol/L (ref 22–32)
CREATININE: 1.72 mg/dL — AB (ref 0.44–1.00)
Calcium: 9.5 mg/dL (ref 8.9–10.3)
GFR, EST AFRICAN AMERICAN: 31 mL/min — AB (ref >60)
GFR, EST NON AFRICAN AMERICAN: 27 mL/min — AB (ref >60)
Glucose, Bld: 289 mg/dL — ABNORMAL HIGH (ref 65–99)
POTASSIUM: 5.1 mmol/L (ref 3.5–5.1)
SODIUM: 138 mmol/L (ref 135–145)
Total Bilirubin: 0.7 mg/dL (ref 0.3–1.2)
Total Protein: 7.2 g/dL (ref 6.5–8.1)

## 2015-10-31 LAB — BRAIN NATRIURETIC PEPTIDE: B NATRIURETIC PEPTIDE 5: 195.2 pg/mL — AB (ref 0.0–100.0)

## 2015-10-31 LAB — TSH: TSH: 2.102 u[IU]/mL (ref 0.350–4.500)

## 2015-10-31 NOTE — Progress Notes (Signed)
  Echocardiogram 2D Echocardiogram has been performed.  Leta Jungling M 10/31/2015, 11:04 AM

## 2015-10-31 NOTE — Patient Instructions (Signed)
Labs today.  Your physician recommends that you schedule a follow-up appointment in: 3 months  Do the following things EVERYDAY: 1) Weigh yourself in the morning before breakfast. Write it down and keep it in a log. 2) Take your medicines as prescribed 3) Eat low salt foods-Limit salt (sodium) to 2000 mg per day.  4) Stay as active as you can everyday 5) Limit all fluids for the day to less than 2 liters 6)   

## 2015-11-02 NOTE — Progress Notes (Signed)
Patient ID: Danielle Benitez, female   DOB: 1932-09-12, 82 y.o.   MRN: [id number] PCP: Dr. Para March HF Cardiology: Dr. Shirlee Latch  82 yo with history of HTN, orthostatic hypotension, symptomatic bradycardia with Medtronic PPM, paroxysmal atrial fibrillation chronic systolic CHF, CKD stage III, and iron deficiency anemia presents for cardiology followup. Around the beginning of 9/16, she began to develop exertional dyspnea. She was seen in the cardiology office on 07/25/15. She was in atrial fibrillation with RVR and echo showed EF 35-40% (had been 50-55% in past). PPM interrogation showed that atrial fibrillation had begun 07/11/15, so exertional dyspnea had preceded this by about 4 weeks. She was admitted from 10/7-10/17. She was anemic with hemoglobin 8.7. She was heme negative but Fe deficient. EGD was not done. She had TEE-guided DCCV to NSR. She also had AKI with creatinine up to 3.41, but it trended back down. She was sent home in NSR. She was readmitted 10/19-10/23 with recurrent atrial fibrillation and acute on chronic systolic CHF. She was diuresed about 10 lbs. It was decided to rate control her rather than repeat cardioversion, and she was sent home. she was only at home for about a day and developed increased dyspnea so came back to the ER and was re-admitted.  She was diuresed with IV Lasix and started on amiodarone.  She underwent DCCV on 10/26 but atrial fibrillation recurred on 10/29.  More amiodarone was loaded, and she went back into NSR.   Lexiscan Cardiolite in 11/16 showed basal inferior/inferolatearl fixed defect consistent with infarction but no ischemia.    Repeat echo was done today.  This showed EF 45% with normal RV size and mildly decreased systolic function. She is doing well overall.  She remains in NSR on amiodarone.  Weight is stable.  She is unsteady walking and needs a walker.  She can walk in the house without dyspnea and walked into the office today without problems.  No  orthopnea/PND.  No lightheadedness, syncope, or falls.  No chest pain.  No tachypalpitations.   ECG: a-paced, RBBB  Labs (7/16): LDL 48, HDL 55 Labs (10/16): K 3.9, creatinine 2.08 Labs (11/16): K 4.9, creatinine 2.5, LFTs normal, TSH normal, HCT 43.6 Labs (12/16): K 5.2, creatinine 1.73, HCT 41.9, plts 123  PMH: 1. HTN but also with history of orthostatic hypotension. 2. CKD stage III 3. Aortic stenosis: Mild 4. Symptomatic bradycardia s/p Medtronic PPM. 5. Hyperlipidemia 6. Atrial fibrillation: Paroxysmal. TEE-guided DCCV in 10/16. She had DCCV again latera in 10/16 and was started on amiodarone.   - PFTs (11/16) with normal spirometry, severe diffusion defect c/w pulmonary vascular disease.  7. Type II diabetes 8. Gout 9. Fe-deficiency anemia 10. Chronic systolic CHF: Echo 11/14 with EF 50-55%. Echo (10/16) with EF 35-40%, moderate LV dilation, diffuse hypokinesis, mild AS, moderate MR, PASP 48 mmHg.  Lexiscan Cardiolite (11/16) with EF 31%, basal inferior and inferolateral fixed defect possibly consistent with infarction, no ischemia; degree of LV hypokinesis was out of proportion to the perfusion defect.  Echo (1/17) with EF 45%, normal RV size with mildly decreased systolic function, aortic sclerosis without stenosis.  11. Esophageal dysmotility  SH: Lives alone but daughter is nearby.  Nonsmoker. No ETOH.    FH: No premature CAD.   ROS: All systems reviewed and negative except as per HPI.    Current Outpatient Prescriptions  Medication Sig Dispense Refill  . acetaminophen (TYLENOL) 500 MG tablet Take 1-2 tablets (500-1,000 mg total) by mouth every 8 (eight) hours  as needed. 30 tablet 0  . albuterol (PROVENTIL HFA;VENTOLIN HFA) 108 (90 BASE) MCG/ACT inhaler Inhale 2 puffs into the lungs every 6 (six) hours as needed for wheezing or shortness of breath. 1 Inhaler 0  . allopurinol (ZYLOPRIM) 300 MG tablet Take 1 tablet (300 mg total) by mouth daily. 90 tablet 3  . amiodarone  (PACERONE) 200 MG tablet Take 200 mg by mouth daily.    Marland Kitchen apixaban (ELIQUIS) 2.5 MG TABS tablet Take 1 tablet (2.5 mg total) by mouth 2 (two) times daily. 60 tablet 11  . atorvastatin (LIPITOR) 20 MG tablet Take 1 tablet (20 mg total) by mouth daily at 6 PM. 30 tablet 6  . cetirizine (ZYRTEC) 10 MG tablet Take 10 mg by mouth daily.    . DULoxetine (CYMBALTA) 20 MG capsule Take 1 capsule (20 mg total) by mouth at bedtime. Take 1 tablet by mouth daily to help treat anxiety and nerves. 90 capsule 3  . furosemide (LASIX) 40 MG tablet Take 1 tablet (40 mg total) by mouth daily. 60 tablet 6  . glimepiride (AMARYL) 1 MG tablet Take 1 tablet (1 mg total) by mouth daily with breakfast. 90 tablet 3  . iron polysaccharides (NU-IRON) 150 MG capsule Take 1 capsule (150 mg total) by mouth 2 (two) times daily. 60 capsule 3  . metoprolol succinate (TOPROL-XL) 50 MG 24 hr tablet Take 1 tablet (50 mg total) by mouth 2 (two) times daily. 60 tablet 3  . pantoprazole (PROTONIX) 40 MG tablet Take 1 tablet (40 mg total) by mouth daily. 30 tablet 3  . saxagliptin HCl (ONGLYZA) 2.5 MG TABS tablet Take 1 tablet (2.5 mg total) by mouth daily. Take one tablet by mouth once daily for blood sugar 30 tablet 2  . senna-docusate (SENOKOT-S) 8.6-50 MG tablet Take 1 tablet by mouth daily as needed for mild constipation.    Marland Kitchen spironolactone (ALDACTONE) 25 MG tablet Take 0.5 tablets (12.5 mg total) by mouth daily. 15 tablet 6  . traZODone (DESYREL) 50 MG tablet Take 1 tablet (50 mg total) by mouth at bedtime as needed for sleep. 20 tablet 0  . triamcinolone (KENALOG) 0.025 % cream Apply 1 application topically 4 (four) times daily as needed (itching).    . vitamin C (ASCORBIC ACID) 500 MG tablet Take 1 tablet (500 mg total) by mouth daily.     No current facility-administered medications for this encounter.   BP 124/82 mmHg  Pulse 88  Wt 156 lb 3.4 oz (70.856 kg)  SpO2 98% General: NAD Neck: No JVD, no thyromegaly or thyroid  nodule.  Lungs: Clear to auscultation bilaterally with normal respiratory effort. CV: Nondisplaced PMI.  Heart regular S1/S2, no S3/S4, no murmur.  No peripheral edema.  No carotid bruit.  Normal pedal pulses.  Abdomen: Soft, nontender, no hepatosplenomegaly, no distention.  Skin: Intact without lesions or rashes.  Neurologic: Alert and oriented x 3.  Psych: Normal affect. Extremities: No clubbing or cyanosis.  HEENT: Normal.   Assessment/Plan: 1. Chronic systolic CHF: EF 29-56% by echo 10/16. Etiology uncertain: could be tachy-mediated cardiomyopathy, but symptoms seem to predate the onset of atrial fibrillation. Possible viral myocarditis. Cannot rule out ischemic cardiomyopathy, but no definite anginal symptoms. Cardiolite in 11/16 showed a small area possibly of prior infarction but EF and wall motion abnormalities were out of proportion to the perfusion defect. By 1/17, EF was improved to 45%.  NYHA class II-III symptoms.  She is not significantly volume overloaded on exam. - Continue  Lasix 40 mg po daily.  - Continue current Toprol XL and spironolactone.  - Hold off on ACEI for now until we see that creatinine has improved.  2. Atrial fibrillation: Tolerates poorly. A-paced s/p DCCV 08/06/15.  - Continue Eliquis - Toprol XL 50 mg bid.  - Continue amiodarone for maintenance of NSR. Check TSH and LFTs today.  She will need a regular eye exam.   3. CKD Stage III: BMET today.  4. Medtronic PPM for symptomatic bradycardia. 5. CAD: Possible old infarction on Cardiolite.  No ischemia.  Would not cath with significant CKD and no chest pain.  Continue statin, good lipids in 7/16.  No aspirin given Eliquis use.   [name] 11/02/2015

## 2015-11-03 ENCOUNTER — Telehealth: Payer: Self-pay

## 2015-11-03 NOTE — Telephone Encounter (Signed)
Spoke with daughter [name]).  Patient is doing better, still nauseated but no vomiting or diarrhea since earlier this morning.  Patient is sipping liquids.

## 2015-11-03 NOTE — Telephone Encounter (Signed)
Please see what information you can get later today.  Thanks.

## 2015-11-03 NOTE — Telephone Encounter (Signed)
Noted. Thanks.

## 2015-11-03 NOTE — Telephone Encounter (Signed)
PLEASE NOTE: All timestamps contained within this report are represented as [place] Standard Time. CONFIDENTIALTY NOTICE: This fax transmission is intended only for the addressee. It contains information that is legally privileged, confidential or otherwise protected from use or disclosure. If you are not the intended recipient, you are strictly prohibited from reviewing, disclosing, copying using or disseminating any of this information or taking any action in reliance on or regarding this information. If you have received this fax in error, please notify us immediately by telephone so that we can arrange for its return to [place]. Phone: [phone], Toll-Free: [phone], Fax: [phone] Page: 1 of 2 Call Id: [id number] [facility] Night - Client TELEPHONE ADVICE RECORD [facility] Medical Call Center Patient Name: Danielle Benitez Gender: Female DOB: [birth year] Age: 82 Y 10 M 13 D Return Phone Number: [phone] (Primary), [phone] (Secondary) Address: City/State/Zip: [place] Client [facility] Night - Client Client Site [facility] - Night Physician Raechel Ache Contact Type Call Call Type Triage / Clinical Caller Name Colin Mulders Relationship To Patient Daughter Return Phone Number [phone] (Primary) Chief Complaint Vomiting Initial Comment Caller states her mother has been nauseated since this AM. She had a bad instance with diarrhea. She's now in the bathroom visibly shaking. The PT is also vomiting. She is a heart failure, Renal failure, and pacemaker patient. PreDisposition Go to ED Nurse Assessment Nurse: Manson Passey, RN, Lyn Date/Time Lamount Cohen Time): 11/02/2015 9:11:12 PM Confirm and document reason for call. If symptomatic, describe symptoms. You must click the next button to save text entered. ---Caller states her mother has been nauseated since this AM. She had a bad instance( did not make it to the commode)  with Diarrhea; has Diverticulitis with frequent diarrhea. She's now in the bathroom visibly shaking and has been for some time; has stopped. Began vomiting while in the bathroom. About 7 pm complained of being cold. Had difficulty getting up to go to the bathroom. She is Congestive heart failure, Renal failure, and Pacemaker, A-fib. On Eliquis, blood thinner. Blood Sugar is 151; BP 151/73, Pulse is 75. Earlier had sharp pain in right side of chest. Is still very nauseated. Has the patient traveled out of the country within the last 30 days? ---No Does the patient have any new or worsening symptoms? ---Yes Will a triage be completed? ---Yes Related visit to physician within the last 2 weeks? ---No Does the PT have any chronic conditions? (i.e. diabetes, asthma, etc.) ---Yes List chronic conditions. ---Congestive heart failure, Renal failure, and Pacemaker, A-fib. On Eliquis, blood thinner, Diabetes, Recurrent Diverticulitis with frequent diarrhea, Acid Refux Is this a behavioral health or substance abuse call? ---No PLEASE NOTE: All timestamps contained within this report are represented as [place] Standard Time. CONFIDENTIALTY NOTICE: This fax transmission is intended only for the addressee. It contains information that is legally privileged, confidential or otherwise protected from use or disclosure. If you are not the intended recipient, you are strictly prohibited from reviewing, disclosing, copying using or disseminating any of this information or taking any action in reliance on or regarding this information. If you have received this fax in error, please notify us immediately by telephone so that we can arrange for its return to [place]. Phone: [phone], Toll-Free: [phone], Fax: [phone] Page: 2 of 2 Call Id: [id number] Guidelines Guideline Title Affirmed Question Affirmed Notes Nurse Date/Time Lamount Cohen Time) Vomiting High-risk adult (e.g., diabetes mellitus,  brain tumor, V-P shunt, hernia) Manson Passey, RN, Lyn 11/02/2015 9:23:42 PM  Disp. Time Lamount Cohen Time) Disposition Final User 11/02/2015 9:30:44 PM Go to ED Now (or PCP triage) Yes Manson Passey, RN, Lyn Caller Understands: Yes Disagree/Comply: Comply Care Advice Given Per Guideline GO TO ED NOW (OR PCP TRIAGE): * IF NO PCP TRIAGE: You need to be seen. Go to the [facility] at [facility] within the next hour. Leave as soon as you can. CARE ADVICE per Vomiting (Adult) guideline. After Care Instructions Given Call Event Type User Date / Time Description Referrals [facility] - ED

## 2015-11-03 NOTE — Telephone Encounter (Signed)
When tries to stand legs shaking and pt has restarted vomiting and diarrhea.Pt has slept a lot today. Pt has voided only one time today.Pt is not drinking a lot and has not gained wt today.pt has appt with Mayra Reel NP on 11/04/15 at 8:45 AM. Spoke with Mayra Reel NP and needs to go to ED for eval of dehydration, keep appt in AM in case needs F/U from ED. [name] voiced understanding.

## 2015-11-03 NOTE — Telephone Encounter (Signed)
Unable to reach pt or pts daughter by phone.

## 2015-11-04 ENCOUNTER — Encounter: Payer: Self-pay | Admitting: Primary Care

## 2015-11-04 ENCOUNTER — Telehealth: Payer: Self-pay | Admitting: *Deleted

## 2015-11-04 ENCOUNTER — Ambulatory Visit (INDEPENDENT_AMBULATORY_CARE_PROVIDER_SITE_OTHER): Payer: Medicare Other | Admitting: Primary Care

## 2015-11-04 VITALS — BP 122/62 | HR 72 | Temp 98.2°F | Ht 63.0 in | Wt 155.1 lb

## 2015-11-04 DIAGNOSIS — R112 Nausea with vomiting, unspecified: Secondary | ICD-10-CM

## 2015-11-04 MED ORDER — ONDANSETRON 4 MG PO TBDP: 4.0000 mg | ORAL_TABLET | Freq: Three times a day (TID) | ORAL | Status: DC | PRN

## 2015-11-04 NOTE — Telephone Encounter (Signed)
I called patient for 51-month phone call for [email or web address]  Study. I spoke to patient's daughter since patient was resting.Patient is doing well but was re-hospitalized for CHF from 08/04/15 to 08/11/15.I thanked patient for her participation in the study.

## 2015-11-04 NOTE — Progress Notes (Signed)
Pre visit review using our clinic review tool, if applicable. No additional management support is needed unless otherwise documented below in the visit note. 

## 2015-11-04 NOTE — Patient Instructions (Signed)
Try taking Zofran (ondansetron) 4mg  OTD every 8 hours as needed for nausea and vomiting.   Slowly push fluids and advance your diet as tolerated as discussed.  Please call me if you start to feel increasingly weak, your abdominal pain gets worse, you develop fevers, or confusion.  It was a pleasure meeting you!

## 2015-11-04 NOTE — Progress Notes (Signed)
Subjective:    Patient ID: Danielle Benitez, female    DOB: Dec 25, 1931, 82 y.o.   MRN: [id number]  HPI  [name] is an 82 year old female who presents today with a chief complaint of nausea and vomiting. She also reports body aches, lower quadrant adominal discomfort and weakness. Her symptoms began Saturday afternoon and have progressed through today. She started feeling improved until she started walking around her house. She had a few episodes of diarrhea onSunday night, which has since dissipated. She's had little PO intake due to decrease in appetite and nausea. Her last episode of vomiting was yesterday after eating crackers, but was able to to keep in crackers last night and again this morning without vomiting. She's taken tylenol yesterday for a headache without significant improvement. Denies confusion, disorientation, falls, bloody emesis, bloody stools, fevers, dizziness, weight gain or significant loss.  Wt Readings from Last 3 Encounters:  11/04/15 155 lb 1.9 oz (70.362 kg)  10/31/15 156 lb 3.4 oz (70.856 kg)  10/17/15 154 lb (69.854 kg)     Review of Systems  Constitutional: Positive for fatigue. Negative for fever and chills.  Gastrointestinal: Positive for nausea and vomiting. Negative for diarrhea and constipation.       Lower quadrant cramping  Genitourinary: Negative for dysuria.  Neurological: Positive for weakness. Negative for dizziness.       Past Medical History  Diagnosis Date  . Gout   . Osteoarthritis   . S/P cardiac pacemaker procedure, PPM [employer] REVO placed 01/11/2012 01/11/2012    a. for symptomatic bradycardia. Followed by Dr. Ladona Ridgel.  . Personal history of fall   . Memory loss   . Anxiety state, unspecified   . Other and unspecified hyperlipidemia   . Anemia, unspecified   . Depressive disorder, not elsewhere classified   . Lumbago   . Nonspecific abnormal results of liver function study   . Obesity, unspecified   . Internal hemorrhoids without  mention of complication   . Abnormality of gait   . Pain in joint, shoulder region 11/22/2013    left   . Pruritus 11/22/2013  . Eczema 11/22/2013  . Full dentures   . HOH (hard of hearing)   . Wears glasses   . Female stress incontinence 05/06/2015  . DM (diabetes mellitus), type 2, uncontrolled, with renal complications (HCC) 01/08/2012  . CKD (chronic kidney disease), stage III   . Aortic stenosis     a. Mild by echo 08/2013.  . Essential hypertension   . Orthostatic hypotension   . Bifascicular block   . Chronic systolic CHF (congestive heart failure) (HCC)     a. Dx 07/2015 - EF 35-40%, unclear etiology  . Mild CAD     a. Cath 2013: 20-30% prox RCA.  Marland Kitchen Anticoagulated- Eliquis 07/31/2015  . Atrial fibrillation - developed 07/11/15 by PPM interrogation, recurrent after DCCV 10/14 07/11/2015     recurrent after DCCV 10/14  . Presence of permanent cardiac pacemaker   . Esophageal dysmotility 08/05/2015    Social History   Social History  . Marital Status: Widowed    Spouse Name: N/A  . Number of Children: N/A  . Years of Education: N/A   Occupational History  . Not on file.   Social History Main Topics  . Smoking status: Never Smoker   . Smokeless tobacco: Never Used  . Alcohol Use: No  . Drug Use: No  . Sexual Activity: No   Other Topics Concern  .  Not on file   Social History Narrative   Physically inactive. She says she hurts in her back and knees too much to do much walking.   9th grade   Worked in multiple mills   Living with daughter    Doesn't drive as of [id number]/4696    Past Surgical History  Procedure Laterality Date  . Extremity cyst excision    . Cardiac catheterization  12/03/2003    Dr Clarene Duke  . Colonoscopy      ?????  . Orif wrist fracture Right 04/09/2014    Procedure: OPEN REDUCTION INTERNAL FIXATION (ORIF) RIGHT DISPLACED  DISTAL RADIUS  FRACTURE;  Surgeon: Dominica Severin, MD;  Location: [facility];  Service: Orthopedics;   Laterality: Right;  . Left heart catheterization with coronary angiogram N/A 01/10/2012    Procedure: LEFT HEART CATHETERIZATION WITH CORONARY ANGIOGRAM;  Surgeon: Chrystie Nose, MD;  Location: [facility] C[facility] LAB;  Service: Cardiovascular;  Laterality: N/A;  . Permanent pacemaker insertion Left 01/11/2012    Procedure: PERMANENT PACEMAKER INSERTION;  Surgeon: Thurmon Fair, MD;  Location: [facility] CATH LAB;  Service: Cardiovascular;  Laterality: Left;  . Tee without cardioversion N/A 07/25/2015    Procedure: TRANSESOPHAGEAL ECHOCARDIOGRAM (TEE);  Surgeon: Pricilla Riffle, MD;  Location: [facility] ENDOSCOPY;  Service: Cardiovascular;  Laterality: N/A;  . Cardioversion N/A 07/25/2015    Procedure: CARDIOVERSION;  Surgeon: Pricilla Riffle, MD;  Location: [facility] ENDOSCOPY;  Service: Cardiovascular;  Laterality: N/A;  . Cataract extraction w/ intraocular lens  implant, bilateral Bilateral 03/2015-04/2015  . Cardioversion N/A 08/06/2015    Procedure: CARDIOVERSION;  Surgeon: Laurey Morale, MD;  Location: [facility] ENDOSCOPY;  Service: Cardiovascular;  Laterality: N/A;  . Esophagogastroduodenoscopy N/A 08/08/2015    Procedure: ESOPHAGOGASTRODUODENOSCOPY (EGD);  Surgeon: Iva Boop, MD;  Location: [facility] ENDOSCOPY;  Service: Endoscopy;  Laterality: N/A;    Family History  Problem Relation Age of Onset  . Breast cancer Mother   . Cancer Mother   . Diabetes Daughter   . Hypertension Daughter   . Cancer Brother     Allergies  Allergen Reactions  . Beta Adrenergic Blockers Other (See Comments)    Couldn't speak or hear Metoprolol seems ok  . Jardiance [Empagliflozin] Other (See Comments)    Rash, kidney issues  . Tetanus Toxoids Other (See Comments)    UNKNOWN    Current Outpatient Prescriptions on File Prior to Visit  Medication Sig Dispense Refill  . acetaminophen (TYLENOL) 500 MG tablet Take 1-2 tablets (500-1,000 mg total) by mouth every 8 (eight) hours as needed. 30 tablet 0  . albuterol (PROVENTIL HFA;VENTOLIN HFA) 108  (90 BASE) MCG/ACT inhaler Inhale 2 puffs into the lungs every 6 (six) hours as needed for wheezing or shortness of breath. 1 Inhaler 0  . allopurinol (ZYLOPRIM) 300 MG tablet Take 1 tablet (300 mg total) by mouth daily. 90 tablet 3  . amiodarone (PACERONE) 200 MG tablet Take 200 mg by mouth daily.    Marland Kitchen apixaban (ELIQUIS) 2.5 MG TABS tablet Take 1 tablet (2.5 mg total) by mouth 2 (two) times daily. 60 tablet 11  . atorvastatin (LIPITOR) 20 MG tablet Take 1 tablet (20 mg total) by mouth daily at 6 PM. 30 tablet 6  . cetirizine (ZYRTEC) 10 MG tablet Take 10 mg by mouth daily.    . DULoxetine (CYMBALTA) 20 MG capsule Take 1 capsule (20 mg total) by mouth at bedtime. Take 1 tablet by mouth daily to help treat anxiety and nerves. 90 capsule  3  . furosemide (LASIX) 40 MG tablet Take 1 tablet (40 mg total) by mouth daily. 60 tablet 6  . glimepiride (AMARYL) 1 MG tablet Take 1 tablet (1 mg total) by mouth daily with breakfast. 90 tablet 3  . iron polysaccharides (NU-IRON) 150 MG capsule Take 1 capsule (150 mg total) by mouth 2 (two) times daily. 60 capsule 3  . metoprolol succinate (TOPROL-XL) 50 MG 24 hr tablet Take 1 tablet (50 mg total) by mouth 2 (two) times daily. 60 tablet 3  . pantoprazole (PROTONIX) 40 MG tablet Take 1 tablet (40 mg total) by mouth daily. 30 tablet 3  . saxagliptin HCl (ONGLYZA) 2.5 MG TABS tablet Take 1 tablet (2.5 mg total) by mouth daily. Take one tablet by mouth once daily for blood sugar 30 tablet 2  . senna-docusate (SENOKOT-S) 8.6-50 MG tablet Take 1 tablet by mouth daily as needed for mild constipation.    Marland Kitchen spironolactone (ALDACTONE) 25 MG tablet Take 0.5 tablets (12.5 mg total) by mouth daily. 15 tablet 6  . traZODone (DESYREL) 50 MG tablet Take 1 tablet (50 mg total) by mouth at bedtime as needed for sleep. 20 tablet 0  . triamcinolone (KENALOG) 0.025 % cream Apply 1 application topically 4 (four) times daily as needed (itching).    . vitamin C (ASCORBIC ACID) 500 MG  tablet Take 1 tablet (500 mg total) by mouth daily.     No current facility-administered medications on file prior to visit.    BP 122/62 mmHg  Pulse 72  Temp(Src) 98.2 F (36.8 C) (Oral)  Ht 5\' 3"  (1.6 m)  Wt 155 lb 1.9 oz (70.362 kg)  BMI 27.49 kg/m2    Objective:   Physical Exam  Constitutional: She is oriented to person, place, and time. She appears well-nourished.  Does not appear dehydrated  Cardiovascular: Normal rate and regular rhythm.   Pulmonary/Chest: Effort normal and breath sounds normal.  Abdominal: Soft. Bowel sounds are normal. There is no tenderness.  Neurological: She is alert and oriented to person, place, and time.  Skin: Skin is warm and dry.          Assessment & Plan:  Nausea and Vomiting:  Secondary to suspected viral GI illness. Exam without evidence of acute dehydration; vital signs stable; does not appear toxic. Labs from Cardiology recently drawn and are stable. Will have her slowly advance PO intake as tolerated. RX for ODT zofran provided PRN to assist with oral intake. Instructions provided to patient and daughter regarding return precautions. Goals are to rehydrate to increase strength and energy, prevent falls. Her daughter will be with her at home today.

## 2015-11-05 ENCOUNTER — Encounter: Payer: Self-pay | Admitting: Family Medicine

## 2015-11-05 ENCOUNTER — Ambulatory Visit (INDEPENDENT_AMBULATORY_CARE_PROVIDER_SITE_OTHER): Payer: Medicare Other | Admitting: Family Medicine

## 2015-11-05 ENCOUNTER — Telehealth: Payer: Self-pay

## 2015-11-05 VITALS — BP 100/60 | HR 72 | Temp 97.9°F

## 2015-11-05 DIAGNOSIS — R197 Diarrhea, unspecified: Secondary | ICD-10-CM

## 2015-11-05 DIAGNOSIS — R531 Weakness: Secondary | ICD-10-CM

## 2015-11-05 NOTE — Telephone Encounter (Signed)
Danielle Benitez pts daughter said the no N&V and diarrhea today; pt is extremely weak and her legs shake when she stands with walker. BS running in 170s. BP now is 116/57 P 71. Danielle Benitez said she is very concerned and appreciates Dr Para March opening the 4;30 pm today and is aware will be work in and will probably have to wait.

## 2015-11-05 NOTE — Patient Instructions (Addendum)
I want family to stay at home with you.  Go to the lab on the way out.  We'll contact you with your lab report. Skip your lasix/furosemide tomorrow (Thursday). Update me tomorrow.  If you get profoundly weaker or more short of breath, then go to the ER.  Take care.  Glad to see you.

## 2015-11-05 NOTE — Telephone Encounter (Signed)
Noted. Thanks.

## 2015-11-05 NOTE — Progress Notes (Signed)
Pre visit review using our clinic review tool, if applicable. No additional management support is needed unless otherwise documented below in the visit note.  F/u for weakness.  Last OV note reviewed.  Had HA and fatigue prev.  Food doesn't taste normally.  No more vomiting or diarrhea.  No fevers.  Weight is stable at home, no change her patient.  Feels weak all over.  Not walking well, even with her walker.  This is new for her.  No focally but generally weak diffusely.  Family can stay at home with her, ie is available to care for patient at home.   Meds, vitals, and allergies reviewed.   ROS: See HPI.  Otherwise, noncontributory.  nad In wheelchair Mmm Neck supple, no LA rrr ctab abd soft Not ttp, normal BS Ext w/o edema

## 2015-11-06 ENCOUNTER — Telehealth: Payer: Self-pay

## 2015-11-06 DIAGNOSIS — R197 Diarrhea, unspecified: Secondary | ICD-10-CM

## 2015-11-06 DIAGNOSIS — R531 Weakness: Secondary | ICD-10-CM | POA: Insufficient documentation

## 2015-11-06 LAB — COMPREHENSIVE METABOLIC PANEL
ALK PHOS: 137 U/L — AB (ref 39–117)
ALT: 28 U/L (ref 0–35)
AST: 51 U/L — ABNORMAL HIGH (ref 0–37)
Albumin: 3.2 g/dL — ABNORMAL LOW (ref 3.5–5.2)
BUN: 59 mg/dL — ABNORMAL HIGH (ref 6–23)
CO2: 30 mEq/L (ref 19–32)
Calcium: 8.9 mg/dL (ref 8.4–10.5)
Chloride: 93 mEq/L — ABNORMAL LOW (ref 96–112)
Creatinine, Ser: 2.51 mg/dL — ABNORMAL HIGH (ref 0.40–1.20)
GFR: 19.51 mL/min — AB (ref >60.00)
GLUCOSE: 114 mg/dL — AB (ref 70–99)
POTASSIUM: 4.5 meq/L (ref 3.5–5.1)
Sodium: 131 mEq/L — ABNORMAL LOW (ref 135–145)
TOTAL PROTEIN: 6.8 g/dL (ref 6.0–8.3)
Total Bilirubin: 1 mg/dL (ref 0.2–1.2)

## 2015-11-06 LAB — CBC WITH DIFFERENTIAL/PLATELET
Basophils Absolute: 0 10*3/uL (ref 0.0–0.1)
Basophils Relative: 0.3 % (ref 0.0–3.0)
EOS PCT: 0 % (ref 0.0–5.0)
Eosinophils Absolute: 0 10*3/uL (ref 0.0–0.7)
HCT: 40.1 % (ref 36.0–46.0)
Hemoglobin: 12.9 g/dL (ref 12.0–15.0)
LYMPHS ABS: 0.8 10*3/uL (ref 0.7–4.0)
Lymphocytes Relative: 6.8 % — ABNORMAL LOW (ref 12.0–46.0)
MCHC: 32.1 g/dL (ref 30.0–36.0)
MCV: 92.6 fl (ref 78.0–100.0)
MONOS PCT: 7 % (ref 3.0–12.0)
Monocytes Absolute: 0.8 10*3/uL (ref 0.1–1.0)
NEUTROS ABS: 10.1 10*3/uL — AB (ref 1.4–7.7)
PLATELETS: 85 10*3/uL — AB (ref 150.0–400.0)
RBC: 4.34 Mil/uL (ref 3.87–5.11)
RDW: 19 % — ABNORMAL HIGH (ref 11.5–15.5)
WBC: 11.7 10*3/uL — ABNORMAL HIGH (ref 4.0–10.5)

## 2015-11-06 MED ORDER — NYSTATIN 100000 UNIT/GM EX POWD: Freq: Three times a day (TID) | CUTANEOUS | Status: DC

## 2015-11-06 NOTE — Telephone Encounter (Signed)
[name] advised.  [name] says patient is a little bit better today.  She was able to get up off the couch on her own a few times but is still sleeping a lot.  Patient fell again last evening but didn't get hurt.  Lab appt scheduled.

## 2015-11-06 NOTE — Assessment & Plan Note (Signed)
Diffuse, not focal.  Likely from fluid/electrolyte loss with recent illness.  Still okay for outpatient f/u.  Check basic labs today.  Continue PO fluids, home with family.  If clinically worsening, to ER.  Would skip lasix dose tomorrow.  They'll update me as needed.

## 2015-11-06 NOTE — Telephone Encounter (Signed)
[name] pts daughter (DPR signed) said pt has gaulded area that is red and oozing clear drainage under lower abdomen. [name] has cleaned the area and used Microguard powder (antifungal powder with miconazole nitrate 2 %) that pt was given in hospital. Area is sore and hurting. If pt should continue this powder, request refill to [facility] or what else would Dr Para March suggest. [name] request cb.

## 2015-11-06 NOTE — Telephone Encounter (Signed)
Please call them back.   Okay to change to nystatin powder.   If still weeping and wet then use the powder.  When improved, change to OTC barrier cream for protection.   Her labs are resulted- her labs look like she was dehydrated.   Assuming she can keep fluids down and clinically improves, then okay to stay off lasix today and tomorrow and recheck labs tomorrow.   Orders are in.  If worsening, to ER.   I wouldn't restart the lasix this weekend, unless she was really puffy.  Thanks.

## 2015-11-07 ENCOUNTER — Other Ambulatory Visit (INDEPENDENT_AMBULATORY_CARE_PROVIDER_SITE_OTHER): Payer: Medicare Other

## 2015-11-07 ENCOUNTER — Other Ambulatory Visit: Payer: Medicare Other

## 2015-11-07 ENCOUNTER — Ambulatory Visit: Payer: Medicare Other | Admitting: Family Medicine

## 2015-11-07 ENCOUNTER — Telehealth: Payer: Self-pay | Admitting: *Deleted

## 2015-11-07 DIAGNOSIS — R197 Diarrhea, unspecified: Secondary | ICD-10-CM | POA: Diagnosis not present

## 2015-11-07 LAB — CBC WITH DIFFERENTIAL/PLATELET
Basophils Absolute: 0 10*3/uL (ref 0.0–0.1)
Basophils Relative: 0 % (ref 0.0–3.0)
EOS PCT: 0.1 % (ref 0.0–5.0)
Eosinophils Absolute: 0 10*3/uL (ref 0.0–0.7)
HEMATOCRIT: 43.4 % (ref 36.0–46.0)
HEMOGLOBIN: 14 g/dL (ref 12.0–15.0)
Lymphocytes Relative: 8.3 % — ABNORMAL LOW (ref 12.0–46.0)
Lymphs Abs: 1 10*3/uL (ref 0.7–4.0)
MCHC: 32.2 g/dL (ref 30.0–36.0)
MCV: 91.8 fl (ref 78.0–100.0)
MONOS PCT: 9.3 % (ref 3.0–12.0)
Monocytes Absolute: 1.1 10*3/uL — ABNORMAL HIGH (ref 0.1–1.0)
Neutro Abs: 10 10*3/uL — ABNORMAL HIGH (ref 1.4–7.7)
Neutrophils Relative %: 82.3 % — ABNORMAL HIGH (ref 43.0–77.0)
Platelets: 101 10*3/uL — ABNORMAL LOW (ref 150.0–400.0)
RBC: 4.73 Mil/uL (ref 3.87–5.11)
RDW: 19.3 % — ABNORMAL HIGH (ref 11.5–15.5)
WBC: 12.1 10*3/uL — AB (ref 4.0–10.5)

## 2015-11-07 LAB — COMPREHENSIVE METABOLIC PANEL
ALBUMIN: 3.1 g/dL — AB (ref 3.5–5.2)
ALK PHOS: 211 U/L — AB (ref 39–117)
ALT: 57 U/L — ABNORMAL HIGH (ref 0–35)
AST: 116 U/L — ABNORMAL HIGH (ref 0–37)
BUN: 57 mg/dL — ABNORMAL HIGH (ref 6–23)
CO2: 30 mEq/L (ref 19–32)
Calcium: 9.4 mg/dL (ref 8.4–10.5)
Chloride: 95 mEq/L — ABNORMAL LOW (ref 96–112)
Creatinine, Ser: 2.3 mg/dL — ABNORMAL HIGH (ref 0.40–1.20)
GFR: 21.58 mL/min — AB (ref >60.00)
Glucose, Bld: 128 mg/dL — ABNORMAL HIGH (ref 70–99)
POTASSIUM: 4.5 meq/L (ref 3.5–5.1)
Sodium: 133 mEq/L — ABNORMAL LOW (ref 135–145)
TOTAL PROTEIN: 7 g/dL (ref 6.0–8.3)
Total Bilirubin: 2.3 mg/dL — ABNORMAL HIGH (ref 0.2–1.2)

## 2015-11-07 NOTE — Telephone Encounter (Signed)
Daughter [name]) was contacted to get an update from her episode yesterday and while talking with her, [name] says her mother's urine has a "fishy" smell.  Mother is coming a little later this morning for repeat labs and is asking if they could leave a urine specimen or does she need an appt with your or whomever might be available?

## 2015-11-07 NOTE — Telephone Encounter (Signed)
I would expect this, since she has a pad.  If she has persistent burning with urination, then I would get a ua and ucx. D/w pt and daughter.  She doesn't have burning with urination, so I wouldn't check urine labs.   She feel globally better, stronger today.  She walked into the clinic, which is an improvement from last OV.  She looks brighter.   Plan: recheck labs today, hold lasix through the weekend unless she has sig edema.  See notes on labs when resulted.    Pt and daughter agree.

## 2015-11-11 ENCOUNTER — Ambulatory Visit (INDEPENDENT_AMBULATORY_CARE_PROVIDER_SITE_OTHER): Payer: Medicare Other | Admitting: Family Medicine

## 2015-11-11 ENCOUNTER — Encounter: Payer: Self-pay | Admitting: Family Medicine

## 2015-11-11 VITALS — BP 110/70 | HR 84 | Temp 97.4°F | Wt 154.5 lb

## 2015-11-11 DIAGNOSIS — R531 Weakness: Secondary | ICD-10-CM

## 2015-11-11 DIAGNOSIS — R7401 Elevation of levels of liver transaminase levels: Secondary | ICD-10-CM

## 2015-11-11 DIAGNOSIS — R740 Nonspecific elevation of levels of transaminase and lactic acid dehydrogenase [LDH]: Secondary | ICD-10-CM

## 2015-11-11 NOTE — Patient Instructions (Signed)
Go to the lab on the way out.  We'll contact you with your lab report. Don't take lasix until we see your labs and call you.  Take care.  Glad to see you.

## 2015-11-11 NOTE — Progress Notes (Signed)
Pre visit review using our clinic review tool, if applicable. No additional management support is needed unless otherwise documented below in the visit note.  F/u for elevated Cr, diarrhea, likely relative dehydration and global weakness.  She walked into clinic today, feels globally better.  Still not puffy, took first dose of lasix today, this AM.  Weight is stable.  She isn't have SOB or CP.  No diarrhea.  Still not much appetite.  Taking fluids.  Still fatigued and globally but not focally weak.  Prev labs d/w pt.    Meds, vitals, and allergies reviewed.   ROS: See HPI.  Otherwise, noncontributory.  nad In chair, can walk with walker.   Mmm Neck supple, no LA rrr ctab abd soft Not ttp, normal BS Ext w/o edema

## 2015-11-12 ENCOUNTER — Other Ambulatory Visit: Payer: Self-pay | Admitting: Family Medicine

## 2015-11-12 DIAGNOSIS — Z8739 Personal history of other diseases of the musculoskeletal system and connective tissue: Secondary | ICD-10-CM

## 2015-11-12 DIAGNOSIS — D72829 Elevated white blood cell count, unspecified: Secondary | ICD-10-CM

## 2015-11-12 LAB — CBC WITH DIFFERENTIAL/PLATELET
BASOS ABS: 0.1 10*3/uL (ref 0.0–0.1)
Basophils Relative: 0.8 % (ref 0.0–3.0)
EOS PCT: 0.3 % (ref 0.0–5.0)
Eosinophils Absolute: 0 10*3/uL (ref 0.0–0.7)
HEMATOCRIT: 40.5 % (ref 36.0–46.0)
Hemoglobin: 13.1 g/dL (ref 12.0–15.0)
LYMPHS ABS: 0.9 10*3/uL (ref 0.7–4.0)
MCHC: 32.3 g/dL (ref 30.0–36.0)
MCV: 92.9 fl (ref 78.0–100.0)
MONOS PCT: 2.6 % — AB (ref 3.0–12.0)
Monocytes Absolute: 0.4 10*3/uL (ref 0.1–1.0)
Neutro Abs: 14.8 10*3/uL — ABNORMAL HIGH (ref 1.4–7.7)
Platelets: 178 10*3/uL (ref 150.0–400.0)
RBC: 4.37 Mil/uL (ref 3.87–5.11)
RDW: 18.1 % — ABNORMAL HIGH (ref 11.5–15.5)
WBC: 16.4 10*3/uL — ABNORMAL HIGH (ref 4.0–10.5)

## 2015-11-12 LAB — COMPREHENSIVE METABOLIC PANEL
ALK PHOS: 340 U/L — AB (ref 39–117)
ALT: 36 U/L — ABNORMAL HIGH (ref 0–35)
AST: 72 U/L — ABNORMAL HIGH (ref 0–37)
Albumin: 3 g/dL — ABNORMAL LOW (ref 3.5–5.2)
BUN: 41 mg/dL — ABNORMAL HIGH (ref 6–23)
CALCIUM: 9.2 mg/dL (ref 8.4–10.5)
CO2: 27 meq/L (ref 19–32)
Chloride: 98 mEq/L (ref 96–112)
Creatinine, Ser: 1.82 mg/dL — ABNORMAL HIGH (ref 0.40–1.20)
GFR: 28.27 mL/min — AB (ref >60.00)
GLUCOSE: 256 mg/dL — AB (ref 70–99)
POTASSIUM: 4.7 meq/L (ref 3.5–5.1)
Sodium: 135 mEq/L (ref 135–145)
Total Bilirubin: 2.1 mg/dL — ABNORMAL HIGH (ref 0.2–1.2)
Total Protein: 7.2 g/dL (ref 6.0–8.3)

## 2015-11-12 MED ORDER — COLCHICINE 0.6 MG PO TABS: 0.3000 mg | ORAL_TABLET | Freq: Every day | ORAL | Status: DC | PRN

## 2015-11-12 NOTE — Assessment & Plan Note (Signed)
She is still a fall risk due to deconditioning and needs a shower chair.  We'll check on getting this.  It was prev ordered but she hasn't gotten it yet.   Recheck labs today.  Okay for outpatient f/u.   Would hold lasix again until repeat labs done and resulted.   Continue PO fluids.  I expect that she will slowly improve.   transaminitis prev noted, I would expect this to gradually improve.   She has benign abd exam.   Still okay for outpatient fu.

## 2015-11-13 ENCOUNTER — Other Ambulatory Visit (INDEPENDENT_AMBULATORY_CARE_PROVIDER_SITE_OTHER): Payer: Medicare Other

## 2015-11-13 ENCOUNTER — Telehealth: Payer: Self-pay | Admitting: Family Medicine

## 2015-11-13 ENCOUNTER — Ambulatory Visit (INDEPENDENT_AMBULATORY_CARE_PROVIDER_SITE_OTHER)
Admission: RE | Admit: 2015-11-13 | Discharge: 2015-11-13 | Disposition: A | Discharge status: Home / Self Care | Payer: Medicare Other | Source: Ambulatory Visit | Attending: Family Medicine | Admitting: Family Medicine

## 2015-11-13 DIAGNOSIS — D72829 Elevated white blood cell count, unspecified: Secondary | ICD-10-CM | POA: Diagnosis not present

## 2015-11-13 DIAGNOSIS — Z8739 Personal history of other diseases of the musculoskeletal system and connective tissue: Secondary | ICD-10-CM

## 2015-11-13 DIAGNOSIS — Z8639 Personal history of other endocrine, nutritional and metabolic disease: Secondary | ICD-10-CM | POA: Diagnosis not present

## 2015-11-13 DIAGNOSIS — R531 Weakness: Secondary | ICD-10-CM

## 2015-11-13 DIAGNOSIS — R509 Fever, unspecified: Secondary | ICD-10-CM | POA: Diagnosis not present

## 2015-11-13 NOTE — Telephone Encounter (Signed)
Needs HH PT and shower chair.  [facility].   Orders put in.    D/w pt's daughter 11/13/15.  Patient fell last night.  She has had trouble with BMs,  getting cleaned up after BMs.  I presume this is from combination of factors.  Family is still caring for her at home.

## 2015-11-13 NOTE — Telephone Encounter (Signed)
rx done for shower chair, hard copy rx, for them to take to DME supplier.  Thanks.

## 2015-11-14 ENCOUNTER — Telehealth: Payer: Self-pay | Admitting: *Deleted

## 2015-11-14 ENCOUNTER — Other Ambulatory Visit: Payer: Medicare Other

## 2015-11-14 ENCOUNTER — Other Ambulatory Visit: Payer: Self-pay | Admitting: Family Medicine

## 2015-11-14 DIAGNOSIS — R531 Weakness: Secondary | ICD-10-CM

## 2015-11-14 LAB — URIC ACID: URIC ACID, SERUM: 3.7 mg/dL (ref 2.4–7.0)

## 2015-11-14 LAB — CBC WITH DIFFERENTIAL/PLATELET
BASOS ABS: 0 10*3/uL (ref 0.0–0.1)
BASOS PCT: 0.3 % (ref 0.0–3.0)
EOS ABS: 0.1 10*3/uL (ref 0.0–0.7)
Eosinophils Relative: 0.4 % (ref 0.0–5.0)
HCT: 38.9 % (ref 36.0–46.0)
Hemoglobin: 12.4 g/dL (ref 12.0–15.0)
LYMPHS ABS: 1.3 10*3/uL (ref 0.7–4.0)
Lymphocytes Relative: 11.4 % — ABNORMAL LOW (ref 12.0–46.0)
MCHC: 31.8 g/dL (ref 30.0–36.0)
MCV: 93.9 fl (ref 78.0–100.0)
Monocytes Absolute: 0.2 10*3/uL (ref 0.1–1.0)
Monocytes Relative: 1.4 % — ABNORMAL LOW (ref 3.0–12.0)
NEUTROS ABS: 9.7 10*3/uL — AB (ref 1.4–7.7)
PLATELETS: 242 10*3/uL (ref 150.0–400.0)
RBC: 4.14 Mil/uL (ref 3.87–5.11)
RDW: 17.7 % — AB (ref 11.5–15.5)
WBC: 11.3 10*3/uL — ABNORMAL HIGH (ref 4.0–10.5)

## 2015-11-14 LAB — COMPREHENSIVE METABOLIC PANEL
ALBUMIN: 3.1 g/dL — AB (ref 3.5–5.2)
ALT: 38 U/L — ABNORMAL HIGH (ref 0–35)
AST: 58 U/L — ABNORMAL HIGH (ref 0–37)
Alkaline Phosphatase: 324 U/L — ABNORMAL HIGH (ref 39–117)
BUN: 35 mg/dL — AB (ref 6–23)
CHLORIDE: 101 meq/L (ref 96–112)
CO2: 27 meq/L (ref 19–32)
Calcium: 9.2 mg/dL (ref 8.4–10.5)
Creatinine, Ser: 1.45 mg/dL — ABNORMAL HIGH (ref 0.40–1.20)
GFR: 36.75 mL/min — ABNORMAL LOW (ref >60.00)
Glucose, Bld: 139 mg/dL — ABNORMAL HIGH (ref 70–99)
POTASSIUM: 5.3 meq/L — AB (ref 3.5–5.1)
Sodium: 137 mEq/L (ref 135–145)
Total Bilirubin: 1.4 mg/dL — ABNORMAL HIGH (ref 0.2–1.2)
Total Protein: 7.5 g/dL (ref 6.0–8.3)

## 2015-11-14 NOTE — Telephone Encounter (Signed)
She needs PT and we need to talk about this at OV.  .

## 2015-11-14 NOTE — Telephone Encounter (Addendum)
Danielle Benitez called to advise of critical lab pt's Anaerobic bottle was positive, it's a preliminary result so right now Danielle Benitez said it's a Gram variable rod until they get the final results  Report given to Dr. Para March

## 2015-11-14 NOTE — Telephone Encounter (Signed)
[name] (daughter) says that the patient fell again last night and is sitting around crying a lot.  Daughter thinks she is really depressed.  She is currently taking Cymbalta.  Does this need to be increased?

## 2015-11-14 NOTE — Telephone Encounter (Signed)
Rx given to daughter 

## 2015-11-14 NOTE — Telephone Encounter (Signed)
Daughter advised and will schedule 30 min OV.

## 2015-11-14 NOTE — Telephone Encounter (Signed)
Likely contaminant.  Other labs are fine.  In the absence of a fever, wouldn't change anything for now.  Will await final reports.  Thanks.

## 2015-11-15 LAB — URINE CULTURE

## 2015-11-17 ENCOUNTER — Telehealth: Payer: Self-pay | Admitting: Family Medicine

## 2015-11-17 ENCOUNTER — Encounter: Payer: Self-pay | Admitting: *Deleted

## 2015-11-17 DIAGNOSIS — I429 Cardiomyopathy, unspecified: Secondary | ICD-10-CM | POA: Diagnosis not present

## 2015-11-17 DIAGNOSIS — Z950 Presence of cardiac pacemaker: Secondary | ICD-10-CM | POA: Diagnosis not present

## 2015-11-17 DIAGNOSIS — E1122 Type 2 diabetes mellitus with diabetic chronic kidney disease: Secondary | ICD-10-CM | POA: Diagnosis not present

## 2015-11-17 DIAGNOSIS — I5023 Acute on chronic systolic (congestive) heart failure: Secondary | ICD-10-CM | POA: Diagnosis not present

## 2015-11-17 DIAGNOSIS — Z7901 Long term (current) use of anticoagulants: Secondary | ICD-10-CM | POA: Diagnosis not present

## 2015-11-17 DIAGNOSIS — Z7984 Long term (current) use of oral hypoglycemic drugs: Secondary | ICD-10-CM | POA: Diagnosis not present

## 2015-11-17 DIAGNOSIS — I130 Hypertensive heart and chronic kidney disease with heart failure and stage 1 through stage 4 chronic kidney disease, or unspecified chronic kidney disease: Secondary | ICD-10-CM | POA: Diagnosis not present

## 2015-11-17 DIAGNOSIS — R4189 Other symptoms and signs involving cognitive functions and awareness: Secondary | ICD-10-CM

## 2015-11-17 DIAGNOSIS — E785 Hyperlipidemia, unspecified: Secondary | ICD-10-CM | POA: Diagnosis not present

## 2015-11-17 DIAGNOSIS — I2510 Atherosclerotic heart disease of native coronary artery without angina pectoris: Secondary | ICD-10-CM | POA: Diagnosis not present

## 2015-11-17 DIAGNOSIS — J449 Chronic obstructive pulmonary disease, unspecified: Secondary | ICD-10-CM | POA: Diagnosis not present

## 2015-11-17 DIAGNOSIS — Z7951 Long term (current) use of inhaled steroids: Secondary | ICD-10-CM | POA: Diagnosis not present

## 2015-11-17 DIAGNOSIS — N183 Chronic kidney disease, stage 3 (moderate): Secondary | ICD-10-CM | POA: Diagnosis not present

## 2015-11-17 LAB — CULTURE, BLOOD (SINGLE)

## 2015-11-17 NOTE — Telephone Encounter (Signed)
Stacy with [facility] called back and wanted to pass along some information to Dr. Para March. With speaking with the pt and family, she suggests a referral to speech therapy would be beneficial to pt. If you have any questions you can contact Stacy at [phone].

## 2015-11-17 NOTE — Telephone Encounter (Signed)
Noted. Thanks.

## 2015-11-17 NOTE — Telephone Encounter (Signed)
Kennyth Arnold wanted to let you know You ordered PT for pt new start date will be today 11/17/15

## 2015-11-17 NOTE — Telephone Encounter (Signed)
Daughter advised.

## 2015-11-18 NOTE — Telephone Encounter (Signed)
I'll put it in but I need an indication for the order.  Please get more info from [place]. Thanks.

## 2015-11-18 NOTE — Telephone Encounter (Signed)
Kennyth Arnold says the daughter [name]) has brought it to her attention that the patient has gotten more forgetful, memory changes, become more child-like.  Daughter says her mother was handling her own medications, etc but now is not able to do it.  Kennyth Arnold says that sometimes speech therapy deals with some cognitive issues like this.  Please advise.

## 2015-11-19 ENCOUNTER — Other Ambulatory Visit: Payer: Self-pay | Admitting: Family Medicine

## 2015-11-19 DIAGNOSIS — I429 Cardiomyopathy, unspecified: Secondary | ICD-10-CM | POA: Diagnosis not present

## 2015-11-19 DIAGNOSIS — E785 Hyperlipidemia, unspecified: Secondary | ICD-10-CM | POA: Diagnosis not present

## 2015-11-19 DIAGNOSIS — I2510 Atherosclerotic heart disease of native coronary artery without angina pectoris: Secondary | ICD-10-CM | POA: Diagnosis not present

## 2015-11-19 DIAGNOSIS — N183 Chronic kidney disease, stage 3 (moderate): Secondary | ICD-10-CM | POA: Diagnosis not present

## 2015-11-19 DIAGNOSIS — Z7901 Long term (current) use of anticoagulants: Secondary | ICD-10-CM | POA: Diagnosis not present

## 2015-11-19 DIAGNOSIS — J449 Chronic obstructive pulmonary disease, unspecified: Secondary | ICD-10-CM | POA: Diagnosis not present

## 2015-11-19 DIAGNOSIS — Z7984 Long term (current) use of oral hypoglycemic drugs: Secondary | ICD-10-CM | POA: Diagnosis not present

## 2015-11-19 DIAGNOSIS — I5023 Acute on chronic systolic (congestive) heart failure: Secondary | ICD-10-CM | POA: Diagnosis not present

## 2015-11-19 DIAGNOSIS — Z950 Presence of cardiac pacemaker: Secondary | ICD-10-CM | POA: Diagnosis not present

## 2015-11-19 DIAGNOSIS — E1122 Type 2 diabetes mellitus with diabetic chronic kidney disease: Secondary | ICD-10-CM | POA: Diagnosis not present

## 2015-11-19 DIAGNOSIS — I130 Hypertensive heart and chronic kidney disease with heart failure and stage 1 through stage 4 chronic kidney disease, or unspecified chronic kidney disease: Secondary | ICD-10-CM | POA: Diagnosis not present

## 2015-11-19 DIAGNOSIS — Z7951 Long term (current) use of inhaled steroids: Secondary | ICD-10-CM | POA: Diagnosis not present

## 2015-11-19 DIAGNOSIS — D72829 Elevated white blood cell count, unspecified: Secondary | ICD-10-CM

## 2015-11-19 NOTE — Telephone Encounter (Signed)
Ordered. Thanks

## 2015-11-19 NOTE — Addendum Note (Signed)
Addended by: Joaquim Nam on: 11/19/2015 05:16 PM   Modules accepted: Orders

## 2015-11-19 NOTE — Telephone Encounter (Signed)
Stacy with [facility] notified as instructed by telephone.

## 2015-11-20 ENCOUNTER — Other Ambulatory Visit: Payer: Self-pay | Admitting: *Deleted

## 2015-11-20 ENCOUNTER — Encounter: Payer: Self-pay | Admitting: Family Medicine

## 2015-11-20 ENCOUNTER — Ambulatory Visit (INDEPENDENT_AMBULATORY_CARE_PROVIDER_SITE_OTHER): Payer: Medicare Other | Admitting: Family Medicine

## 2015-11-20 VITALS — BP 124/72 | HR 88 | Temp 97.7°F | Wt 155.8 lb

## 2015-11-20 DIAGNOSIS — F329 Major depressive disorder, single episode, unspecified: Secondary | ICD-10-CM | POA: Diagnosis not present

## 2015-11-20 DIAGNOSIS — D72829 Elevated white blood cell count, unspecified: Secondary | ICD-10-CM | POA: Diagnosis not present

## 2015-11-20 DIAGNOSIS — R945 Abnormal results of liver function studies: Secondary | ICD-10-CM

## 2015-11-20 DIAGNOSIS — F32A Depression, unspecified: Secondary | ICD-10-CM

## 2015-11-20 DIAGNOSIS — R7989 Other specified abnormal findings of blood chemistry: Secondary | ICD-10-CM

## 2015-11-20 DIAGNOSIS — I5023 Acute on chronic systolic (congestive) heart failure: Secondary | ICD-10-CM

## 2015-11-20 DIAGNOSIS — R531 Weakness: Secondary | ICD-10-CM | POA: Diagnosis not present

## 2015-11-20 MED ORDER — SAXAGLIPTIN HCL 2.5 MG PO TABS: 2.5000 mg | ORAL_TABLET | Freq: Every day | ORAL | Status: DC

## 2015-11-20 NOTE — Progress Notes (Signed)
Pre visit review using our clinic review tool, if applicable. No additional management support is needed unless otherwise documented below in the visit note.  F/u for lab abnormalities and deconditioning.   Still using walker.  Better appetite.  No fevers.  Due for f/u labs.   No inc in weight, not on lasix.  No CP, no SOB unless walking distance.  She can walk farther than the last week or two.  She admits to being depressed about her situation, ie less independent.   Her family is helping her but she wants to be independent.  She is sleeping more overall.  Her mood is some better this week, as her strength increased.  Still on cymbalta.  No ADE on med.  It has helped with her mood prev.    Prev labs including likely false pos ucx and bcx d/w pt.  Meds, vitals, and allergies reviewed.   ROS: See HPI.  Otherwise, noncontributory.  nad ncat Mmm rrr ctab abd soft, not ttp Normal BS Ext w/o edema Skin well perfused.  Neck supple.

## 2015-11-20 NOTE — Patient Instructions (Addendum)
If you get puffy or have an increase in weight, then restart the lasix as needed.   We'll contact you with your lab report. Check on a fall alert button.  Keep using your walker.  Don't change your cymbalta for now.   We'll cancel the speech therapy in the meantime.  Take care.  Glad to see you.

## 2015-11-21 DIAGNOSIS — R945 Abnormal results of liver function studies: Secondary | ICD-10-CM

## 2015-11-21 DIAGNOSIS — Z7951 Long term (current) use of inhaled steroids: Secondary | ICD-10-CM | POA: Diagnosis not present

## 2015-11-21 DIAGNOSIS — E1122 Type 2 diabetes mellitus with diabetic chronic kidney disease: Secondary | ICD-10-CM | POA: Diagnosis not present

## 2015-11-21 DIAGNOSIS — Z950 Presence of cardiac pacemaker: Secondary | ICD-10-CM | POA: Diagnosis not present

## 2015-11-21 DIAGNOSIS — Z7984 Long term (current) use of oral hypoglycemic drugs: Secondary | ICD-10-CM | POA: Diagnosis not present

## 2015-11-21 DIAGNOSIS — Z7901 Long term (current) use of anticoagulants: Secondary | ICD-10-CM | POA: Diagnosis not present

## 2015-11-21 DIAGNOSIS — I5023 Acute on chronic systolic (congestive) heart failure: Secondary | ICD-10-CM | POA: Diagnosis not present

## 2015-11-21 DIAGNOSIS — E785 Hyperlipidemia, unspecified: Secondary | ICD-10-CM | POA: Diagnosis not present

## 2015-11-21 DIAGNOSIS — I130 Hypertensive heart and chronic kidney disease with heart failure and stage 1 through stage 4 chronic kidney disease, or unspecified chronic kidney disease: Secondary | ICD-10-CM | POA: Diagnosis not present

## 2015-11-21 DIAGNOSIS — R7989 Other specified abnormal findings of blood chemistry: Secondary | ICD-10-CM | POA: Insufficient documentation

## 2015-11-21 DIAGNOSIS — N183 Chronic kidney disease, stage 3 (moderate): Secondary | ICD-10-CM | POA: Diagnosis not present

## 2015-11-21 DIAGNOSIS — I2510 Atherosclerotic heart disease of native coronary artery without angina pectoris: Secondary | ICD-10-CM | POA: Diagnosis not present

## 2015-11-21 DIAGNOSIS — J449 Chronic obstructive pulmonary disease, unspecified: Secondary | ICD-10-CM | POA: Diagnosis not present

## 2015-11-21 DIAGNOSIS — I429 Cardiomyopathy, unspecified: Secondary | ICD-10-CM | POA: Diagnosis not present

## 2015-11-21 LAB — COMPREHENSIVE METABOLIC PANEL
ALT: 19 U/L (ref 0–35)
AST: 29 U/L (ref 0–37)
Albumin: 3.2 g/dL — ABNORMAL LOW (ref 3.5–5.2)
Alkaline Phosphatase: 192 U/L — ABNORMAL HIGH (ref 39–117)
BUN: 22 mg/dL (ref 6–23)
CHLORIDE: 105 meq/L (ref 96–112)
CO2: 28 mEq/L (ref 19–32)
Calcium: 9.3 mg/dL (ref 8.4–10.5)
Creatinine, Ser: 1.45 mg/dL — ABNORMAL HIGH (ref 0.40–1.20)
GFR: 36.75 mL/min — ABNORMAL LOW (ref >60.00)
GLUCOSE: 159 mg/dL — AB (ref 70–99)
POTASSIUM: 5.4 meq/L — AB (ref 3.5–5.1)
SODIUM: 139 meq/L (ref 135–145)
TOTAL PROTEIN: 7.5 g/dL (ref 6.0–8.3)
Total Bilirubin: 0.6 mg/dL (ref 0.2–1.2)

## 2015-11-21 NOTE — Assessment & Plan Note (Signed)
Looks compensated, no recent lasix use, continue off med unless inc in weight.  She agrees.

## 2015-11-21 NOTE — Assessment & Plan Note (Signed)
Improved, continue with PT at home. D/w pt about fall alert button and daughter will check on that.  Okay for outpatient f/u. >25 minutes spent in face to face time with patient, >50% spent in counselling or coordination of care.

## 2015-11-21 NOTE — Assessment & Plan Note (Signed)
Will follow, we may need to inc cymbalta in the future.  D/w pt.  Wouldn't inc at this point as her mood is some better this week.  Okay for outpatient f/u.

## 2015-11-21 NOTE — Assessment & Plan Note (Signed)
Will likely continue to improve.  Likely viral illness with prev false pos cultures. D/w pt.  Repeat labs pending, including bcx.  No dysuria.  No need to check ucx again.  No fevers.

## 2015-11-23 LAB — CBC WITH DIFFERENTIAL/PLATELET
Basophils Absolute: 0 10*3/uL (ref 0.0–0.1)
Basophils Relative: 0.6 % (ref 0.0–3.0)
EOS PCT: 1 % (ref 0.0–5.0)
Eosinophils Absolute: 0.1 10*3/uL (ref 0.0–0.7)
HCT: 40.9 % (ref 36.0–46.0)
Hemoglobin: 11.9 g/dL — ABNORMAL LOW (ref 12.0–15.0)
LYMPHS ABS: 1.7 10*3/uL (ref 0.7–4.0)
Lymphocytes Relative: 23.7 % (ref 12.0–46.0)
MCHC: 29.1 g/dL — AB (ref 30.0–36.0)
MCV: 104.6 fl — ABNORMAL HIGH (ref 78.0–100.0)
MONO ABS: 0.2 10*3/uL (ref 0.1–1.0)
Monocytes Relative: 3.4 % (ref 3.0–12.0)
NEUTROS PCT: 71.3 % (ref 43.0–77.0)
Neutro Abs: 5 10*3/uL (ref 1.4–7.7)
Platelets: 316 10*3/uL (ref 150.0–400.0)
RBC: 3.91 Mil/uL (ref 3.87–5.11)
RDW: 17.7 % — AB (ref 11.5–15.5)
WBC: 7 10*3/uL (ref 4.0–10.5)

## 2015-11-24 ENCOUNTER — Telehealth: Payer: Self-pay | Admitting: Cardiology

## 2015-11-24 ENCOUNTER — Encounter: Payer: Medicare Other | Admitting: *Deleted

## 2015-11-24 ENCOUNTER — Other Ambulatory Visit (HOSPITAL_COMMUNITY): Payer: Self-pay

## 2015-11-24 DIAGNOSIS — Z950 Presence of cardiac pacemaker: Secondary | ICD-10-CM | POA: Diagnosis not present

## 2015-11-24 DIAGNOSIS — I429 Cardiomyopathy, unspecified: Secondary | ICD-10-CM | POA: Diagnosis not present

## 2015-11-24 DIAGNOSIS — Z7984 Long term (current) use of oral hypoglycemic drugs: Secondary | ICD-10-CM | POA: Diagnosis not present

## 2015-11-24 DIAGNOSIS — E1122 Type 2 diabetes mellitus with diabetic chronic kidney disease: Secondary | ICD-10-CM | POA: Diagnosis not present

## 2015-11-24 DIAGNOSIS — I130 Hypertensive heart and chronic kidney disease with heart failure and stage 1 through stage 4 chronic kidney disease, or unspecified chronic kidney disease: Secondary | ICD-10-CM | POA: Diagnosis not present

## 2015-11-24 DIAGNOSIS — N183 Chronic kidney disease, stage 3 (moderate): Secondary | ICD-10-CM | POA: Diagnosis not present

## 2015-11-24 DIAGNOSIS — E785 Hyperlipidemia, unspecified: Secondary | ICD-10-CM | POA: Diagnosis not present

## 2015-11-24 DIAGNOSIS — I5023 Acute on chronic systolic (congestive) heart failure: Secondary | ICD-10-CM | POA: Diagnosis not present

## 2015-11-24 DIAGNOSIS — Z7901 Long term (current) use of anticoagulants: Secondary | ICD-10-CM | POA: Diagnosis not present

## 2015-11-24 DIAGNOSIS — Z7951 Long term (current) use of inhaled steroids: Secondary | ICD-10-CM | POA: Diagnosis not present

## 2015-11-24 DIAGNOSIS — I2510 Atherosclerotic heart disease of native coronary artery without angina pectoris: Secondary | ICD-10-CM | POA: Diagnosis not present

## 2015-11-24 DIAGNOSIS — J449 Chronic obstructive pulmonary disease, unspecified: Secondary | ICD-10-CM | POA: Diagnosis not present

## 2015-11-24 MED ORDER — AMIODARONE HCL 200 MG PO TABS: 200.0000 mg | ORAL_TABLET | Freq: Every day | ORAL | Status: DC

## 2015-11-24 NOTE — Telephone Encounter (Signed)
LMOVM reminding pt to send remote transmission.   

## 2015-11-25 ENCOUNTER — Encounter: Payer: Self-pay | Admitting: Cardiology

## 2015-11-25 DIAGNOSIS — Z950 Presence of cardiac pacemaker: Secondary | ICD-10-CM | POA: Diagnosis not present

## 2015-11-25 DIAGNOSIS — I5023 Acute on chronic systolic (congestive) heart failure: Secondary | ICD-10-CM | POA: Diagnosis not present

## 2015-11-25 DIAGNOSIS — E1122 Type 2 diabetes mellitus with diabetic chronic kidney disease: Secondary | ICD-10-CM | POA: Diagnosis not present

## 2015-11-25 DIAGNOSIS — Z7901 Long term (current) use of anticoagulants: Secondary | ICD-10-CM | POA: Diagnosis not present

## 2015-11-25 DIAGNOSIS — E785 Hyperlipidemia, unspecified: Secondary | ICD-10-CM | POA: Diagnosis not present

## 2015-11-25 DIAGNOSIS — J449 Chronic obstructive pulmonary disease, unspecified: Secondary | ICD-10-CM | POA: Diagnosis not present

## 2015-11-25 DIAGNOSIS — Z7951 Long term (current) use of inhaled steroids: Secondary | ICD-10-CM | POA: Diagnosis not present

## 2015-11-25 DIAGNOSIS — N183 Chronic kidney disease, stage 3 (moderate): Secondary | ICD-10-CM | POA: Diagnosis not present

## 2015-11-25 DIAGNOSIS — I130 Hypertensive heart and chronic kidney disease with heart failure and stage 1 through stage 4 chronic kidney disease, or unspecified chronic kidney disease: Secondary | ICD-10-CM | POA: Diagnosis not present

## 2015-11-25 DIAGNOSIS — I2510 Atherosclerotic heart disease of native coronary artery without angina pectoris: Secondary | ICD-10-CM | POA: Diagnosis not present

## 2015-11-25 DIAGNOSIS — I429 Cardiomyopathy, unspecified: Secondary | ICD-10-CM | POA: Diagnosis not present

## 2015-11-25 DIAGNOSIS — Z7984 Long term (current) use of oral hypoglycemic drugs: Secondary | ICD-10-CM | POA: Diagnosis not present

## 2015-11-26 DIAGNOSIS — Z950 Presence of cardiac pacemaker: Secondary | ICD-10-CM | POA: Diagnosis not present

## 2015-11-26 DIAGNOSIS — I2510 Atherosclerotic heart disease of native coronary artery without angina pectoris: Secondary | ICD-10-CM | POA: Diagnosis not present

## 2015-11-26 DIAGNOSIS — I429 Cardiomyopathy, unspecified: Secondary | ICD-10-CM | POA: Diagnosis not present

## 2015-11-26 DIAGNOSIS — N183 Chronic kidney disease, stage 3 (moderate): Secondary | ICD-10-CM | POA: Diagnosis not present

## 2015-11-26 DIAGNOSIS — Z7951 Long term (current) use of inhaled steroids: Secondary | ICD-10-CM | POA: Diagnosis not present

## 2015-11-26 DIAGNOSIS — J449 Chronic obstructive pulmonary disease, unspecified: Secondary | ICD-10-CM | POA: Diagnosis not present

## 2015-11-26 DIAGNOSIS — E1122 Type 2 diabetes mellitus with diabetic chronic kidney disease: Secondary | ICD-10-CM | POA: Diagnosis not present

## 2015-11-26 DIAGNOSIS — I5023 Acute on chronic systolic (congestive) heart failure: Secondary | ICD-10-CM | POA: Diagnosis not present

## 2015-11-26 DIAGNOSIS — I130 Hypertensive heart and chronic kidney disease with heart failure and stage 1 through stage 4 chronic kidney disease, or unspecified chronic kidney disease: Secondary | ICD-10-CM | POA: Diagnosis not present

## 2015-11-26 DIAGNOSIS — Z7901 Long term (current) use of anticoagulants: Secondary | ICD-10-CM | POA: Diagnosis not present

## 2015-11-26 DIAGNOSIS — E785 Hyperlipidemia, unspecified: Secondary | ICD-10-CM | POA: Diagnosis not present

## 2015-11-26 DIAGNOSIS — Z7984 Long term (current) use of oral hypoglycemic drugs: Secondary | ICD-10-CM | POA: Diagnosis not present

## 2015-11-27 DIAGNOSIS — I429 Cardiomyopathy, unspecified: Secondary | ICD-10-CM | POA: Diagnosis not present

## 2015-11-27 DIAGNOSIS — Z7951 Long term (current) use of inhaled steroids: Secondary | ICD-10-CM | POA: Diagnosis not present

## 2015-11-27 DIAGNOSIS — E1122 Type 2 diabetes mellitus with diabetic chronic kidney disease: Secondary | ICD-10-CM | POA: Diagnosis not present

## 2015-11-27 DIAGNOSIS — J449 Chronic obstructive pulmonary disease, unspecified: Secondary | ICD-10-CM | POA: Diagnosis not present

## 2015-11-27 DIAGNOSIS — I5023 Acute on chronic systolic (congestive) heart failure: Secondary | ICD-10-CM | POA: Diagnosis not present

## 2015-11-27 DIAGNOSIS — E785 Hyperlipidemia, unspecified: Secondary | ICD-10-CM | POA: Diagnosis not present

## 2015-11-27 DIAGNOSIS — Z7901 Long term (current) use of anticoagulants: Secondary | ICD-10-CM | POA: Diagnosis not present

## 2015-11-27 DIAGNOSIS — I2510 Atherosclerotic heart disease of native coronary artery without angina pectoris: Secondary | ICD-10-CM | POA: Diagnosis not present

## 2015-11-27 DIAGNOSIS — N183 Chronic kidney disease, stage 3 (moderate): Secondary | ICD-10-CM | POA: Diagnosis not present

## 2015-11-27 DIAGNOSIS — Z950 Presence of cardiac pacemaker: Secondary | ICD-10-CM | POA: Diagnosis not present

## 2015-11-27 DIAGNOSIS — Z7984 Long term (current) use of oral hypoglycemic drugs: Secondary | ICD-10-CM | POA: Diagnosis not present

## 2015-11-27 DIAGNOSIS — I130 Hypertensive heart and chronic kidney disease with heart failure and stage 1 through stage 4 chronic kidney disease, or unspecified chronic kidney disease: Secondary | ICD-10-CM | POA: Diagnosis not present

## 2015-11-27 LAB — CULTURE, BLOOD (SINGLE): Organism ID, Bacteria: NO GROWTH

## 2015-11-28 ENCOUNTER — Telehealth: Payer: Self-pay

## 2015-11-28 DIAGNOSIS — I2510 Atherosclerotic heart disease of native coronary artery without angina pectoris: Secondary | ICD-10-CM | POA: Diagnosis not present

## 2015-11-28 DIAGNOSIS — Z7984 Long term (current) use of oral hypoglycemic drugs: Secondary | ICD-10-CM | POA: Diagnosis not present

## 2015-11-28 DIAGNOSIS — I130 Hypertensive heart and chronic kidney disease with heart failure and stage 1 through stage 4 chronic kidney disease, or unspecified chronic kidney disease: Secondary | ICD-10-CM | POA: Diagnosis not present

## 2015-11-28 DIAGNOSIS — E1122 Type 2 diabetes mellitus with diabetic chronic kidney disease: Secondary | ICD-10-CM | POA: Diagnosis not present

## 2015-11-28 DIAGNOSIS — J449 Chronic obstructive pulmonary disease, unspecified: Secondary | ICD-10-CM | POA: Diagnosis not present

## 2015-11-28 DIAGNOSIS — E785 Hyperlipidemia, unspecified: Secondary | ICD-10-CM | POA: Diagnosis not present

## 2015-11-28 DIAGNOSIS — Z950 Presence of cardiac pacemaker: Secondary | ICD-10-CM | POA: Diagnosis not present

## 2015-11-28 DIAGNOSIS — I429 Cardiomyopathy, unspecified: Secondary | ICD-10-CM | POA: Diagnosis not present

## 2015-11-28 DIAGNOSIS — Z7951 Long term (current) use of inhaled steroids: Secondary | ICD-10-CM | POA: Diagnosis not present

## 2015-11-28 DIAGNOSIS — I5023 Acute on chronic systolic (congestive) heart failure: Secondary | ICD-10-CM | POA: Diagnosis not present

## 2015-11-28 DIAGNOSIS — N183 Chronic kidney disease, stage 3 (moderate): Secondary | ICD-10-CM | POA: Diagnosis not present

## 2015-11-28 DIAGNOSIS — Z7901 Long term (current) use of anticoagulants: Secondary | ICD-10-CM | POA: Diagnosis not present

## 2015-11-28 NOTE — Telephone Encounter (Signed)
On 11/27/15 in the evening pt suddenly felt hot and sweaty BP was 210/102; rt side of face was puffy and gave pt Lasix 40 mg.  pt went to fire dept and BP was 180/94; pt went home and felt cold. Pt did not feel bad. This AM gave Lasix 40 mg and now pt BP is 168/94 P 82(slightly elevated for pt); FBS 243. Pt states feels OK this morning.  No SOB.pts grandchildren who live with pt have been exposed to flu. Pt urinated a lot during the night and pts face is not swollen this morning.Please advise.

## 2015-11-28 NOTE — Telephone Encounter (Signed)
I think this was likely a combination of intake and time off lasix prev.  I would continue with lasix once daily in the meantime if SBP >150 and/or inc in weight/puffy.   Please update me Monday.  Thanks.

## 2015-11-28 NOTE — Telephone Encounter (Signed)
Daughter advised.

## 2015-12-01 DIAGNOSIS — E785 Hyperlipidemia, unspecified: Secondary | ICD-10-CM | POA: Diagnosis not present

## 2015-12-01 DIAGNOSIS — Z950 Presence of cardiac pacemaker: Secondary | ICD-10-CM | POA: Diagnosis not present

## 2015-12-01 DIAGNOSIS — Z7951 Long term (current) use of inhaled steroids: Secondary | ICD-10-CM | POA: Diagnosis not present

## 2015-12-01 DIAGNOSIS — Z7901 Long term (current) use of anticoagulants: Secondary | ICD-10-CM | POA: Diagnosis not present

## 2015-12-01 DIAGNOSIS — J449 Chronic obstructive pulmonary disease, unspecified: Secondary | ICD-10-CM | POA: Diagnosis not present

## 2015-12-01 DIAGNOSIS — N183 Chronic kidney disease, stage 3 (moderate): Secondary | ICD-10-CM | POA: Diagnosis not present

## 2015-12-01 DIAGNOSIS — I429 Cardiomyopathy, unspecified: Secondary | ICD-10-CM | POA: Diagnosis not present

## 2015-12-01 DIAGNOSIS — I130 Hypertensive heart and chronic kidney disease with heart failure and stage 1 through stage 4 chronic kidney disease, or unspecified chronic kidney disease: Secondary | ICD-10-CM | POA: Diagnosis not present

## 2015-12-01 DIAGNOSIS — I5023 Acute on chronic systolic (congestive) heart failure: Secondary | ICD-10-CM | POA: Diagnosis not present

## 2015-12-01 DIAGNOSIS — Z7984 Long term (current) use of oral hypoglycemic drugs: Secondary | ICD-10-CM | POA: Diagnosis not present

## 2015-12-01 DIAGNOSIS — E1122 Type 2 diabetes mellitus with diabetic chronic kidney disease: Secondary | ICD-10-CM | POA: Diagnosis not present

## 2015-12-01 DIAGNOSIS — I2510 Atherosclerotic heart disease of native coronary artery without angina pectoris: Secondary | ICD-10-CM | POA: Diagnosis not present

## 2015-12-02 DIAGNOSIS — E1122 Type 2 diabetes mellitus with diabetic chronic kidney disease: Secondary | ICD-10-CM | POA: Diagnosis not present

## 2015-12-02 DIAGNOSIS — Z950 Presence of cardiac pacemaker: Secondary | ICD-10-CM | POA: Diagnosis not present

## 2015-12-02 DIAGNOSIS — Z7951 Long term (current) use of inhaled steroids: Secondary | ICD-10-CM | POA: Diagnosis not present

## 2015-12-02 DIAGNOSIS — E785 Hyperlipidemia, unspecified: Secondary | ICD-10-CM | POA: Diagnosis not present

## 2015-12-02 DIAGNOSIS — J449 Chronic obstructive pulmonary disease, unspecified: Secondary | ICD-10-CM | POA: Diagnosis not present

## 2015-12-02 DIAGNOSIS — I2510 Atherosclerotic heart disease of native coronary artery without angina pectoris: Secondary | ICD-10-CM | POA: Diagnosis not present

## 2015-12-02 DIAGNOSIS — I130 Hypertensive heart and chronic kidney disease with heart failure and stage 1 through stage 4 chronic kidney disease, or unspecified chronic kidney disease: Secondary | ICD-10-CM | POA: Diagnosis not present

## 2015-12-02 DIAGNOSIS — I5023 Acute on chronic systolic (congestive) heart failure: Secondary | ICD-10-CM | POA: Diagnosis not present

## 2015-12-02 DIAGNOSIS — Z7901 Long term (current) use of anticoagulants: Secondary | ICD-10-CM | POA: Diagnosis not present

## 2015-12-02 DIAGNOSIS — I429 Cardiomyopathy, unspecified: Secondary | ICD-10-CM | POA: Diagnosis not present

## 2015-12-02 DIAGNOSIS — N183 Chronic kidney disease, stage 3 (moderate): Secondary | ICD-10-CM | POA: Diagnosis not present

## 2015-12-02 DIAGNOSIS — Z7984 Long term (current) use of oral hypoglycemic drugs: Secondary | ICD-10-CM | POA: Diagnosis not present

## 2015-12-03 ENCOUNTER — Telehealth: Payer: Self-pay | Admitting: *Deleted

## 2015-12-03 NOTE — Telephone Encounter (Signed)
[name] dropped off information regarding patient's weight, days taking lasix and BP readings. Form is on your desk with this information.

## 2015-12-03 NOTE — Telephone Encounter (Signed)
I'll look at the hard copy.  Thanks.  

## 2015-12-03 NOTE — Telephone Encounter (Signed)
Hard copy given back to [facility] with Dr. Lianne Bushy notes attached.  Hard copy sent to be scanned.

## 2015-12-04 DIAGNOSIS — Z7901 Long term (current) use of anticoagulants: Secondary | ICD-10-CM | POA: Diagnosis not present

## 2015-12-04 DIAGNOSIS — I130 Hypertensive heart and chronic kidney disease with heart failure and stage 1 through stage 4 chronic kidney disease, or unspecified chronic kidney disease: Secondary | ICD-10-CM | POA: Diagnosis not present

## 2015-12-04 DIAGNOSIS — Z950 Presence of cardiac pacemaker: Secondary | ICD-10-CM | POA: Diagnosis not present

## 2015-12-04 DIAGNOSIS — E1122 Type 2 diabetes mellitus with diabetic chronic kidney disease: Secondary | ICD-10-CM | POA: Diagnosis not present

## 2015-12-04 DIAGNOSIS — I2510 Atherosclerotic heart disease of native coronary artery without angina pectoris: Secondary | ICD-10-CM | POA: Diagnosis not present

## 2015-12-04 DIAGNOSIS — Z7984 Long term (current) use of oral hypoglycemic drugs: Secondary | ICD-10-CM | POA: Diagnosis not present

## 2015-12-04 DIAGNOSIS — J449 Chronic obstructive pulmonary disease, unspecified: Secondary | ICD-10-CM | POA: Diagnosis not present

## 2015-12-04 DIAGNOSIS — E785 Hyperlipidemia, unspecified: Secondary | ICD-10-CM | POA: Diagnosis not present

## 2015-12-04 DIAGNOSIS — I5023 Acute on chronic systolic (congestive) heart failure: Secondary | ICD-10-CM | POA: Diagnosis not present

## 2015-12-04 DIAGNOSIS — Z7951 Long term (current) use of inhaled steroids: Secondary | ICD-10-CM | POA: Diagnosis not present

## 2015-12-04 DIAGNOSIS — I429 Cardiomyopathy, unspecified: Secondary | ICD-10-CM | POA: Diagnosis not present

## 2015-12-04 DIAGNOSIS — N183 Chronic kidney disease, stage 3 (moderate): Secondary | ICD-10-CM | POA: Diagnosis not present

## 2015-12-05 DIAGNOSIS — E785 Hyperlipidemia, unspecified: Secondary | ICD-10-CM | POA: Diagnosis not present

## 2015-12-05 DIAGNOSIS — I429 Cardiomyopathy, unspecified: Secondary | ICD-10-CM | POA: Diagnosis not present

## 2015-12-05 DIAGNOSIS — Z7951 Long term (current) use of inhaled steroids: Secondary | ICD-10-CM | POA: Diagnosis not present

## 2015-12-05 DIAGNOSIS — Z7984 Long term (current) use of oral hypoglycemic drugs: Secondary | ICD-10-CM | POA: Diagnosis not present

## 2015-12-05 DIAGNOSIS — I5023 Acute on chronic systolic (congestive) heart failure: Secondary | ICD-10-CM | POA: Diagnosis not present

## 2015-12-05 DIAGNOSIS — I130 Hypertensive heart and chronic kidney disease with heart failure and stage 1 through stage 4 chronic kidney disease, or unspecified chronic kidney disease: Secondary | ICD-10-CM | POA: Diagnosis not present

## 2015-12-05 DIAGNOSIS — Z950 Presence of cardiac pacemaker: Secondary | ICD-10-CM | POA: Diagnosis not present

## 2015-12-05 DIAGNOSIS — J449 Chronic obstructive pulmonary disease, unspecified: Secondary | ICD-10-CM | POA: Diagnosis not present

## 2015-12-05 DIAGNOSIS — N183 Chronic kidney disease, stage 3 (moderate): Secondary | ICD-10-CM | POA: Diagnosis not present

## 2015-12-05 DIAGNOSIS — E1122 Type 2 diabetes mellitus with diabetic chronic kidney disease: Secondary | ICD-10-CM | POA: Diagnosis not present

## 2015-12-05 DIAGNOSIS — Z7901 Long term (current) use of anticoagulants: Secondary | ICD-10-CM | POA: Diagnosis not present

## 2015-12-05 DIAGNOSIS — I2510 Atherosclerotic heart disease of native coronary artery without angina pectoris: Secondary | ICD-10-CM | POA: Diagnosis not present

## 2015-12-14 ENCOUNTER — Telehealth: Payer: Self-pay | Admitting: Family Medicine

## 2015-12-14 NOTE — Telephone Encounter (Signed)
See message below.  I would watch and wait at this point, with as needed use of lasix.  Let me know on Monday.       Previous Messages     ----- Message -----   From: Randal Buba   Sent: 12/12/2015  2:40 PM    To: Joaquim Nam, MD  Subject: Mom's b/p                     Good afternoon Dr. Para March,   First I'm sorry to wait till so late to send this note, but I've been swamped, as I'm sure you have too.    I don't want to be the paranoid child of a patient, but at the same time, I don't want to let something go that should have been addressed.   Mom's blood pressure has been a little high, mostly the diastolic #. She doesn't have any symptoms, no weight gain, no SOB, no puffiness, etc. She says she feels fine. For the past three days her b/p has been as follows:   12/10/2015: 141/92 w/a heart rate of 78 (this was first thing in the a.m.) & no lasix was taken   12/11/2015: 149/93 w/a heart rate of 81 (first thing in the a.m.) & no lasix was taken   12/12/2015: 167/89 w/a heart rate of 77 (first thing this a.m.) & she took 40 mg lasix; then at 2:00 pm was 126/97 w/a heart rate of 81.   As you can see her heart rate is up a little bit for her and the b/p #'s are up for her. Do I need to bring her in for an apptmt or change anything or continue to watch it?   Thank you for all you do and for the kindness and compassion you have shown her!   [name]

## 2015-12-15 ENCOUNTER — Telehealth: Payer: Self-pay | Admitting: Family Medicine

## 2015-12-15 NOTE — Telephone Encounter (Signed)
See below, notify [name]- I wouldn't change anything else at this point since she had a noted dec in BP prev on her lasix.   If feeling well, then I would continue as is for now with prn lasix.  Thanks.   --------------------------------------------  Good morning Dr. Para March,   Mom still feels fine, has no swelling, SOB, etc. As you requested just updating her b/p readings over the weekend are as follows (all readings are 1st thing in the morning):   B/P 149/83; h/r 81 No lasix  B/P 160/83; h/r 77 Took 40 mg Lasix  B/P 156/91; h/r 91 Took 40 mg Lasix   Thank you!!  [name]

## 2015-12-16 NOTE — Telephone Encounter (Signed)
Daughter advised.

## 2015-12-23 ENCOUNTER — Telehealth (HOSPITAL_COMMUNITY): Payer: Self-pay

## 2015-12-23 ENCOUNTER — Telehealth: Payer: Self-pay

## 2015-12-23 NOTE — Telephone Encounter (Signed)
Morrie Sheldon RN case mgr with [facility] medicare left v/m; pt is participating in CHF program and Morrie Sheldon request copy of current med list faxed to [employer]; [phone].

## 2015-12-24 NOTE — Telephone Encounter (Signed)
Meliton Rattan RN CM with [facility] medicare requesting last OV note and most recent echo report for enrollment in a free CHF program through their services where patient received a free scale to monitor daily weights. Updates will be forwarded to our office. Requested documents faxed to provided # [phone]  [employer]

## 2015-12-24 NOTE — Telephone Encounter (Signed)
Spoke with [name] (DPR signed) and she said OK to fax med list. Done.

## 2015-12-25 ENCOUNTER — Other Ambulatory Visit: Payer: Self-pay | Admitting: Family Medicine

## 2015-12-25 DIAGNOSIS — E1165 Type 2 diabetes mellitus with hyperglycemia: Principal | ICD-10-CM

## 2015-12-25 DIAGNOSIS — E1121 Type 2 diabetes mellitus with diabetic nephropathy: Secondary | ICD-10-CM

## 2015-12-26 ENCOUNTER — Telehealth: Payer: Self-pay

## 2015-12-26 NOTE — Telephone Encounter (Signed)
Sounds good, continue reg meds and follow Blood pressure.

## 2015-12-26 NOTE — Telephone Encounter (Signed)
[name] said this morning pt took spironolactone and lasix and pt has been resting; now BP is 126/64 P 82; pt still has no symptoms.FYI to Dr Ermalene Searing.

## 2015-12-26 NOTE — Telephone Encounter (Signed)
[name] said pts BP now is still good BP 121/60 P 79. [name] notified as instructed and voiced understanding.

## 2015-12-26 NOTE — Telephone Encounter (Signed)
Danielle Benitez (DPR signed) said pt BP now is 168/103 P77; 12/24/15 and 12/25/15 BP was 141/87 and 138/86. No H/A,dizziness, CP, SOB, blurred vision and no new stress. Dr Ermalene Searing advised pt should rest today;take medication as prescribed,ck BP at lunch and again this afternoon around 3 PM and cb with readings, if BP remains high may adjust med. If any changes in condition pt will cb sooner. FYI to Dr Ermalene Searing.

## 2015-12-31 ENCOUNTER — Other Ambulatory Visit (INDEPENDENT_AMBULATORY_CARE_PROVIDER_SITE_OTHER): Payer: Medicare Other

## 2015-12-31 DIAGNOSIS — E1121 Type 2 diabetes mellitus with diabetic nephropathy: Secondary | ICD-10-CM | POA: Diagnosis not present

## 2015-12-31 DIAGNOSIS — E1165 Type 2 diabetes mellitus with hyperglycemia: Secondary | ICD-10-CM | POA: Diagnosis not present

## 2016-01-01 LAB — COMPREHENSIVE METABOLIC PANEL
ALBUMIN: 3.9 g/dL (ref 3.5–5.2)
ALK PHOS: 108 U/L (ref 39–117)
ALT: 25 U/L (ref 0–35)
AST: 34 U/L (ref 0–37)
BUN: 55 mg/dL — AB (ref 6–23)
CHLORIDE: 101 meq/L (ref 96–112)
CO2: 29 meq/L (ref 19–32)
CREATININE: 1.94 mg/dL — AB (ref 0.40–1.20)
Calcium: 9.6 mg/dL (ref 8.4–10.5)
GFR: 26.26 mL/min — ABNORMAL LOW (ref >60.00)
Glucose, Bld: 164 mg/dL — ABNORMAL HIGH (ref 70–99)
Potassium: 5.5 mEq/L — ABNORMAL HIGH (ref 3.5–5.1)
SODIUM: 137 meq/L (ref 135–145)
Total Bilirubin: 0.4 mg/dL (ref 0.2–1.2)
Total Protein: 7.9 g/dL (ref 6.0–8.3)

## 2016-01-01 LAB — HEMOGLOBIN A1C: HEMOGLOBIN A1C: 7.5 % — AB (ref 4.6–6.5)

## 2016-01-02 ENCOUNTER — Encounter: Payer: Self-pay | Admitting: *Deleted

## 2016-01-02 ENCOUNTER — Other Ambulatory Visit: Payer: Self-pay | Admitting: Family Medicine

## 2016-01-05 ENCOUNTER — Encounter: Payer: Self-pay | Admitting: Family Medicine

## 2016-01-05 ENCOUNTER — Ambulatory Visit (INDEPENDENT_AMBULATORY_CARE_PROVIDER_SITE_OTHER): Payer: Medicare Other | Admitting: Family Medicine

## 2016-01-05 ENCOUNTER — Telehealth (HOSPITAL_COMMUNITY): Payer: Self-pay | Admitting: Vascular Surgery

## 2016-01-05 VITALS — BP 134/80 | HR 90 | Temp 98.3°F | Wt 163.2 lb

## 2016-01-05 DIAGNOSIS — R7989 Other specified abnormal findings of blood chemistry: Secondary | ICD-10-CM | POA: Diagnosis not present

## 2016-01-05 DIAGNOSIS — E1121 Type 2 diabetes mellitus with diabetic nephropathy: Secondary | ICD-10-CM | POA: Diagnosis not present

## 2016-01-05 DIAGNOSIS — R945 Abnormal results of liver function studies: Secondary | ICD-10-CM

## 2016-01-05 DIAGNOSIS — E1165 Type 2 diabetes mellitus with hyperglycemia: Secondary | ICD-10-CM

## 2016-01-05 DIAGNOSIS — I5023 Acute on chronic systolic (congestive) heart failure: Secondary | ICD-10-CM

## 2016-01-05 DIAGNOSIS — N183 Chronic kidney disease, stage 3 unspecified: Secondary | ICD-10-CM

## 2016-01-05 DIAGNOSIS — E875 Hyperkalemia: Secondary | ICD-10-CM

## 2016-01-05 NOTE — Patient Instructions (Signed)
Go to the lab on the way out.  We'll contact you with your lab report.  Stay off the spironolactone for now.  Cut back some on the ice cream and substitute some lower potassium fruits.  We'll be in touch. Take care.  Glad to see you.  Plan on a recheck in about 3 months.

## 2016-01-05 NOTE — Telephone Encounter (Signed)
Left pt daughter message to make f/u appt in april

## 2016-01-05 NOTE — Progress Notes (Signed)
Pre visit review using our clinic review tool, if applicable. No additional management support is needed unless otherwise documented below in the visit note.  DM2 f/u.  Compliant with meds.  Had been eating some ice cream, d/w pt about diet, ie low K low sugar foods. A1c okay at 7.5, d/w pt.    Elevated Cr.  Had been on lasix prn, not daily.  Spironolactone now held.  K was up.  D/w pt.  See above re: low K diet, handout given.  Due for f/u labs.   CHF.  Weight is up.  Likely that her weight itself, and not just fluid, is increased from inc calorie intake.  She isn't puffy.  Not having CP, SOB, BLE edema.  Compliant with meds.    Fall risk.  Using walker, she wants to get back to using only her cane.  D/w pt about trying to inc her distance with the walker in the meantime.  That is likely safer.    PMH and SH reviewed  ROS: See HPI, otherwise noncontributory.  Meds, vitals, and allergies reviewed.   GEN: nad, alert and oriented HEENT: mucous membranes moist NECK: supple w/o LA CV: rrr.  PULM: ctab, no inc wob ABD: soft, +bs EXT: no edema SKIN: no acute rash

## 2016-01-06 ENCOUNTER — Encounter: Payer: Self-pay | Admitting: Family Medicine

## 2016-01-06 LAB — BASIC METABOLIC PANEL
BUN: 51 mg/dL — AB (ref 6–23)
CHLORIDE: 102 meq/L (ref 96–112)
CO2: 28 meq/L (ref 19–32)
CREATININE: 2.08 mg/dL — AB (ref 0.40–1.20)
Calcium: 9.5 mg/dL (ref 8.4–10.5)
GFR: 24.23 mL/min — ABNORMAL LOW (ref >60.00)
GLUCOSE: 176 mg/dL — AB (ref 70–99)
POTASSIUM: 5.5 meq/L — AB (ref 3.5–5.1)
Sodium: 136 mEq/L (ref 135–145)

## 2016-01-06 NOTE — Assessment & Plan Note (Signed)
Resolved, d/w pt re: labs.  Likely was from incidental prev illness.

## 2016-01-06 NOTE — Assessment & Plan Note (Signed)
Appears euvolemic today, continue as is for now with prn lasix, if her BP is >150 systolic.

## 2016-01-06 NOTE — Assessment & Plan Note (Signed)
See notes on f/u BMet, currently off spironolactone.

## 2016-01-06 NOTE — Assessment & Plan Note (Signed)
No change in meds, work on cutting back ice cream and see notes on f/u labs.

## 2016-01-07 ENCOUNTER — Other Ambulatory Visit: Payer: Self-pay | Admitting: Family Medicine

## 2016-01-07 DIAGNOSIS — N183 Chronic kidney disease, stage 3 unspecified: Secondary | ICD-10-CM

## 2016-01-07 MED ORDER — SODIUM POLYSTYRENE SULFONATE 15 GM/60ML PO SUSP: 30.0000 g | Freq: Once | ORAL | Status: DC

## 2016-01-09 ENCOUNTER — Telehealth: Payer: Self-pay | Admitting: Family Medicine

## 2016-01-09 NOTE — Telephone Encounter (Signed)
Patient's daughter advised.  

## 2016-01-09 NOTE — Telephone Encounter (Signed)
I would take 1 dose of lasix today and try to limit use otherwise.  If her weight goes back up, then she'll need another dose over the weekend.   Thanks.

## 2016-01-09 NOTE — Telephone Encounter (Signed)
Pt has gained 5 lbs since 01/08/16.  Her hands and fingers aare swollen, but no swelling anywhere else and no shortness of breath.  Please advise.  Best number to call  [name] is [phone]

## 2016-01-12 ENCOUNTER — Other Ambulatory Visit (INDEPENDENT_AMBULATORY_CARE_PROVIDER_SITE_OTHER): Payer: Medicare Other

## 2016-01-12 ENCOUNTER — Telehealth: Payer: Self-pay

## 2016-01-12 DIAGNOSIS — N183 Chronic kidney disease, stage 3 unspecified: Secondary | ICD-10-CM

## 2016-01-12 NOTE — Telephone Encounter (Signed)
Pt came in for outpt labs and pt has gained 8 lbs since seen 01/05/16. No SOB but feet are swollen; pt has not had Lasix since 01/09/16. What to do. Please advise.

## 2016-01-12 NOTE — Telephone Encounter (Signed)
Advised [name] her daughter to have patient take a dose of lasix tonight and tomorrow AM and then update me.  She agrees.

## 2016-01-13 ENCOUNTER — Encounter: Payer: Self-pay | Admitting: Family Medicine

## 2016-01-13 LAB — BASIC METABOLIC PANEL
BUN: 29 mg/dL — ABNORMAL HIGH (ref 6–23)
CHLORIDE: 104 meq/L (ref 96–112)
CO2: 32 mEq/L (ref 19–32)
CREATININE: 1.52 mg/dL — AB (ref 0.40–1.20)
Calcium: 9.1 mg/dL (ref 8.4–10.5)
GFR: 34.79 mL/min — ABNORMAL LOW (ref >60.00)
Glucose, Bld: 182 mg/dL — ABNORMAL HIGH (ref 70–99)
Potassium: 5.2 mEq/L — ABNORMAL HIGH (ref 3.5–5.1)
SODIUM: 140 meq/L (ref 135–145)

## 2016-01-15 ENCOUNTER — Other Ambulatory Visit: Payer: Self-pay | Admitting: *Deleted

## 2016-01-15 DIAGNOSIS — K219 Gastro-esophageal reflux disease without esophagitis: Secondary | ICD-10-CM

## 2016-01-15 MED ORDER — PANTOPRAZOLE SODIUM 40 MG PO TBEC: 40.0000 mg | DELAYED_RELEASE_TABLET | Freq: Every day | ORAL | Status: DC

## 2016-01-15 NOTE — Telephone Encounter (Signed)
Last filled 06/19/2015 by Johnnye Sima send rx to primary care Dr. Para March.

## 2016-01-20 ENCOUNTER — Telehealth: Payer: Self-pay

## 2016-01-20 ENCOUNTER — Telehealth (HOSPITAL_COMMUNITY): Payer: Self-pay | Admitting: Cardiology

## 2016-01-20 ENCOUNTER — Ambulatory Visit (HOSPITAL_COMMUNITY)
Admission: RE | Admit: 2016-01-20 | Discharge: 2016-01-20 | Disposition: A | Discharge status: Home / Self Care | Payer: Medicare Other | Source: Ambulatory Visit | Attending: Internal Medicine | Admitting: Internal Medicine

## 2016-01-20 VITALS — BP 144/62 | HR 96 | Wt 168.0 lb

## 2016-01-20 DIAGNOSIS — I350 Nonrheumatic aortic (valve) stenosis: Secondary | ICD-10-CM | POA: Diagnosis not present

## 2016-01-20 DIAGNOSIS — I5023 Acute on chronic systolic (congestive) heart failure: Secondary | ICD-10-CM

## 2016-01-20 DIAGNOSIS — M109 Gout, unspecified: Secondary | ICD-10-CM | POA: Insufficient documentation

## 2016-01-20 DIAGNOSIS — Z8679 Personal history of other diseases of the circulatory system: Secondary | ICD-10-CM

## 2016-01-20 DIAGNOSIS — Z7984 Long term (current) use of oral hypoglycemic drugs: Secondary | ICD-10-CM | POA: Insufficient documentation

## 2016-01-20 DIAGNOSIS — D509 Iron deficiency anemia, unspecified: Secondary | ICD-10-CM | POA: Insufficient documentation

## 2016-01-20 DIAGNOSIS — Z950 Presence of cardiac pacemaker: Secondary | ICD-10-CM | POA: Insufficient documentation

## 2016-01-20 DIAGNOSIS — I2510 Atherosclerotic heart disease of native coronary artery without angina pectoris: Secondary | ICD-10-CM | POA: Insufficient documentation

## 2016-01-20 DIAGNOSIS — N183 Chronic kidney disease, stage 3 unspecified: Secondary | ICD-10-CM

## 2016-01-20 DIAGNOSIS — E785 Hyperlipidemia, unspecified: Secondary | ICD-10-CM | POA: Diagnosis not present

## 2016-01-20 DIAGNOSIS — E1122 Type 2 diabetes mellitus with diabetic chronic kidney disease: Secondary | ICD-10-CM | POA: Insufficient documentation

## 2016-01-20 DIAGNOSIS — I130 Hypertensive heart and chronic kidney disease with heart failure and stage 1 through stage 4 chronic kidney disease, or unspecified chronic kidney disease: Secondary | ICD-10-CM | POA: Insufficient documentation

## 2016-01-20 DIAGNOSIS — I429 Cardiomyopathy, unspecified: Secondary | ICD-10-CM

## 2016-01-20 DIAGNOSIS — Z7901 Long term (current) use of anticoagulants: Secondary | ICD-10-CM

## 2016-01-20 DIAGNOSIS — Z79899 Other long term (current) drug therapy: Secondary | ICD-10-CM | POA: Insufficient documentation

## 2016-01-20 DIAGNOSIS — I480 Paroxysmal atrial fibrillation: Secondary | ICD-10-CM | POA: Diagnosis not present

## 2016-01-20 DIAGNOSIS — I5022 Chronic systolic (congestive) heart failure: Secondary | ICD-10-CM

## 2016-01-20 MED ORDER — FUROSEMIDE 40 MG PO TABS: 40.0000 mg | ORAL_TABLET | ORAL | Status: DC

## 2016-01-20 NOTE — Addendum Note (Signed)
Encounter addended by: Chyrl Civatte, RN on: 01/20/2016  1:43 PM<BR>     Documentation filed: Notes Section

## 2016-01-20 NOTE — Patient Instructions (Signed)
Take Lasix 40 mg daily for the next 2 days, the 40 mg every other day  Your physician recommends that you schedule a follow-up appointment in: 2 weeks  Do the following things EVERYDAY: 1) Weigh yourself in the morning before breakfast. Write it down and keep it in a log. 2) Take your medicines as prescribed 3) Eat low salt foods-Limit salt (sodium) to 2000 mg per day.  4) Stay as active as you can everyday 5) Limit all fluids for the day to less than 2 liters.

## 2016-01-20 NOTE — Telephone Encounter (Signed)
[name] said pt was seen at cardiology earlier today; pt was given Lasix 40 mg PO 3 hours ago;  Pt is not urinating; pt is droggy and very sleepy, this has not been normal for pt recently. pts face is swollen and some abdominal swelling. Advised [name] to contact cardiology and give them update on pt condition. [name] voiced understanding and will call cardiology now.

## 2016-01-20 NOTE — Progress Notes (Signed)
Today's OV note and last echo report (10/2015) faxed to [facility] CHF Program Attn: Eulis Canner at provided # [phone] This is to enroll patient in the free CHF Program for weight and symptom monitoring at home through [facility].  Ave Filter

## 2016-01-20 NOTE — Progress Notes (Signed)
Patient ID: Danielle Benitez, female   DOB: [birth year], 82 y.o.   MRN: [id number]  PCP: Dr. Para March HF Cardiology: Dr. Shirlee Latch  82 yo with history of HTN, orthostatic hypotension, symptomatic bradycardia with Medtronic PPM, paroxysmal atrial fibrillation chronic systolic CHF, CKD stage III, and iron deficiency anemia presents for cardiology followup. Around the beginning of 9/16, she began to develop exertional dyspnea. She was seen in the cardiology office on 07/25/15. She was in atrial fibrillation with RVR and echo showed EF 35-40% (had been 50-55% in past). PPM interrogation showed that atrial fibrillation had begun 07/11/15, so exertional dyspnea had preceded this by about 4 weeks. She was admitted from 10/7-10/17. She was anemic with hemoglobin 8.7. She was heme negative but Fe deficient. EGD was not done. She had TEE-guided DCCV to NSR. She also had AKI with creatinine up to 3.41, but it trended back down. She was sent home in NSR. She was readmitted 10/19-10/23 with recurrent atrial fibrillation and acute on chronic systolic CHF. She was diuresed about 10 lbs. It was decided to rate control her rather than repeat cardioversion, and she was sent home. she was only at home for about a day and developed increased dyspnea so came back to the ER and was re-admitted.  She was diuresed with IV Lasix and started on amiodarone.  She underwent DCCV on 10/26 but atrial fibrillation recurred on 10/29.  More amiodarone was loaded, and she went back into NSR.   Lexiscan Cardiolite in 11/16 showed basal inferior/inferolatearl fixed defect consistent with infarction but no ischemia.    She returns for an acute work in due to increased dyspnea. A few weeks ago she saw her PCP and had worsening renal function/hyperkalemia  so Lasix was changed to as needed and spiro was stopped. Over the last few days she had increased dyspnea and fatigue. Denies PND/Orthopnea. Weight at home has been trending up from 156 to 168  pounds. No bleeding problems.   ECG: 01/20/2016 a-paced, RBBB 71 bpm   Labs (7/16): LDL 48, HDL 55 Labs (10/16): K 3.9, creatinine 2.08 Labs (11/16): K 4.9, creatinine 2.5, LFTs normal, TSH normal, HCT 43.6 Labs (12/16): K 5.2, creatinine 1.73, HCT 41.9, plts 123 Labs 01/05/2016: K 5.5 Creatinine 2.08  Labs 01/13/2016: K 5.2 Creatinine 1.52   PMH: 1. HTN but also with history of orthostatic hypotension. 2. CKD stage III 3. Aortic stenosis: Mild 4. Symptomatic bradycardia s/p Medtronic PPM. 5. Hyperlipidemia 6. Atrial fibrillation: Paroxysmal. TEE-guided DCCV in 10/16. She had DCCV again latera in 10/16 and was started on amiodarone.   - PFTs (11/16) with normal spirometry, severe diffusion defect c/w pulmonary vascular disease.  7. Type II diabetes 8. Gout 9. Fe-deficiency anemia 10. Chronic systolic CHF: Echo 11/14 with EF 50-55%. Echo (10/16) with EF 35-40%, moderate LV dilation, diffuse hypokinesis, mild AS, moderate MR, PASP 48 mmHg.  Lexiscan Cardiolite (11/16) with EF 31%, basal inferior and inferolateral fixed defect possibly consistent with infarction, no ischemia; degree of LV hypokinesis was out of proportion to the perfusion defect.  Echo (1/17) with EF 45%, normal RV size with mildly decreased systolic function, aortic sclerosis without stenosis.  11. Esophageal dysmotility  SH: Lives alone but daughter is nearby.  Nonsmoker. No ETOH.    FH: No premature CAD.   ROS: All systems reviewed and negative except as per HPI.    Current Outpatient Prescriptions  Medication Sig Dispense Refill  . acetaminophen (TYLENOL) 500 MG tablet Take 1-2 tablets (500-1,000 mg total)  by mouth every 8 (eight) hours as needed. 30 tablet 0  . albuterol (PROVENTIL HFA;VENTOLIN HFA) 108 (90 BASE) MCG/ACT inhaler Inhale 2 puffs into the lungs every 6 (six) hours as needed for wheezing or shortness of breath. 1 Inhaler 0  . allopurinol (ZYLOPRIM) 300 MG tablet Take 1 tablet (300 mg total) by mouth  daily. 90 tablet 3  . amiodarone (PACERONE) 200 MG tablet Take 1 tablet (200 mg total) by mouth daily. 90 tablet 3  . apixaban (ELIQUIS) 2.5 MG TABS tablet Take 1 tablet (2.5 mg total) by mouth 2 (two) times daily. 60 tablet 11  . atorvastatin (LIPITOR) 20 MG tablet Take 1 tablet (20 mg total) by mouth daily at 6 PM. 30 tablet 6  . cetirizine (ZYRTEC) 10 MG tablet Take 10 mg by mouth daily.    . colchicine 0.6 MG tablet Take 0.5 tablets (0.3 mg total) by mouth daily as needed (for gout.  okay to fill with mitigare). 15 tablet 0  . DULoxetine (CYMBALTA) 20 MG capsule Take 1 capsule (20 mg total) by mouth at bedtime. Take 1 tablet by mouth daily to help treat anxiety and nerves. 90 capsule 3  . furosemide (LASIX) 40 MG tablet Take 40 mg by mouth as needed for fluid or edema.    Marland Kitchen glimepiride (AMARYL) 1 MG tablet Take 1 tablet (1 mg total) by mouth daily with breakfast. 90 tablet 3  . iron polysaccharides (NU-IRON) 150 MG capsule Take 1 capsule (150 mg total) by mouth 2 (two) times daily. 60 capsule 3  . metoprolol succinate (TOPROL-XL) 50 MG 24 hr tablet Take 1 tablet (50 mg total) by mouth 2 (two) times daily. 60 tablet 3  . nystatin (MYCOSTATIN) powder Apply topically 3 (three) times daily. 60 g 1  . ondansetron (ZOFRAN ODT) 4 MG disintegrating tablet Take 1 tablet (4 mg total) by mouth every 8 (eight) hours as needed for nausea or vomiting. 20 tablet 0  . pantoprazole (PROTONIX) 40 MG tablet Take 1 tablet (40 mg total) by mouth daily. 30 tablet 3  . saxagliptin HCl (ONGLYZA) 2.5 MG TABS tablet Take 1 tablet (2.5 mg total) by mouth daily. 30 tablet 2  . senna-docusate (SENOKOT-S) 8.6-50 MG tablet Take 1 tablet by mouth daily as needed for mild constipation.    . sodium polystyrene (KAYEXALATE) 15 GM/60ML suspension Take 120 mLs (30 g total) by mouth once. 240 mL 0  . traZODone (DESYREL) 50 MG tablet Take 1 tablet (50 mg total) by mouth at bedtime as needed for sleep. 20 tablet 0  . triamcinolone  (KENALOG) 0.025 % cream Apply 1 application topically 4 (four) times daily as needed (itching).    . vitamin C (ASCORBIC ACID) 500 MG tablet Take 1 tablet (500 mg total) by mouth daily.     No current facility-administered medications for this encounter.   BP 144/62 mmHg  Pulse 96  Wt 168 lb (76.204 kg)  SpO2 97% General: NAD Neck: JVP  9-10  no thyromegaly or thyroid nodule.  Lungs: Clear to auscultation bilaterally with normal respiratory effort. CV: Nondisplaced PMI.  Heart regular S1/S2, no S3/S4, no murmur.  No peripheral edema.  No carotid bruit.  Normal pedal pulses.  Abdomen: Soft, nontender, no hepatosplenomegaly, + distention.  Skin: Intact without lesions or rashes.  Neurologic: Alert and oriented x 3.  Psych: Normal affect. Extremities: No clubbing or cyanosis.  HEENT: Normal.   EKG: A paced 71 bpm   Assessment/Plan: 1. Chronic systolic CHF: EF  35-40% by echo 10/16. Etiology uncertain: could be tachy-mediated cardiomyopathy, but symptoms seem to predate the onset of atrial fibrillation. Possible viral myocarditis. Cannot rule out ischemic cardiomyopathy, but no definite anginal symptoms. Cardiolite in 11/16 showed a small area possibly of prior infarction but EF and wall motion abnormalities were out of proportion to the perfusion defect. By 1/17, EF was improved to 45%.  NYHA class II-III symptoms.   Volume status elevated. Recently, lasix changed to as needed.  Today I have asked that she take lasix 40 mg daily for 2 days then start every other day.  Keep off spiro with recent hyperkalemia.   - Continue Lasix 40 mg po daily.  - Continue current Toprol XL.  - Hold off on ACEI for now until we see that creatinine has improved. -  - Consider adding veltassa but hold off for now and check BMET next visit.  2. Atrial fibrillation: Tolerates poorly. A-paced s/p DCCV 08/06/15.  - Continue Eliquis - Toprol XL 50 mg bid.  - Continue amiodarone 200 mg daily for  maintenance of NSR. Check TSH and LFTs today.  She will need a regular eye exam.   3. CKD Stage III: BMET next visit 4. Medtronic PPM- H/O of symptomatic bradycardia. 5. CAD: Possible old infarction on Cardiolite.  No ischemia.  Would not cath with significant CKD and no chest pain.  Continue statin, good lipids in 7/16.  No aspirin given Eliquis use.   Follow up in 2 weeks to reassess volume status and check BMET.  Janard Culp NP-C  01/20/2016

## 2016-01-20 NOTE — Telephone Encounter (Signed)
Patients daughter called with concerns no UOP after 40 mg of Lasix Daughter reports change in speech( talking as if she does not have teeth in), increase in fatigue( has been around all afternoon on the couch asleep)  Per VO Amy Clegg,NP Patient should report to ER for further evaluation ?stroke  After daughter reports advise to patient, she went in to the bed and refused to go to the ER Daughter was then advised to call EMT for a house call for evaluation  Daughter agreeable and voiced understanding

## 2016-01-20 NOTE — Telephone Encounter (Signed)
Patients daughter called with concerns increased SOB, reports she can hear a rattle in her throat/chest Weight has been up and down, unable to weigh daily as scale is not digital  Increased confusion  Denies-edema Currently taking lasix prn as ordered by pcp  Patient has not had lasix in a few days  Per Amy Clegg,NP  Add on for further evaluation   Work in appt 4/11 @1030 

## 2016-01-21 ENCOUNTER — Ambulatory Visit (INDEPENDENT_AMBULATORY_CARE_PROVIDER_SITE_OTHER): Payer: Medicare Other | Admitting: Family Medicine

## 2016-01-21 ENCOUNTER — Telehealth: Payer: Self-pay | Admitting: Family Medicine

## 2016-01-21 ENCOUNTER — Encounter: Payer: Self-pay | Admitting: Family Medicine

## 2016-01-21 VITALS — BP 110/68 | HR 78 | Temp 98.5°F | Wt 167.5 lb

## 2016-01-21 DIAGNOSIS — G4700 Insomnia, unspecified: Secondary | ICD-10-CM

## 2016-01-21 DIAGNOSIS — I5022 Chronic systolic (congestive) heart failure: Secondary | ICD-10-CM

## 2016-01-21 DIAGNOSIS — R8290 Unspecified abnormal findings in urine: Secondary | ICD-10-CM | POA: Diagnosis not present

## 2016-01-21 LAB — POC URINALSYSI DIPSTICK (AUTOMATED)
Bilirubin, UA: NEGATIVE
Glucose, UA: NEGATIVE
KETONES UA: NEGATIVE
Nitrite, UA: NEGATIVE
PH UA: 6
PROTEIN UA: POSITIVE
RBC UA: NEGATIVE
SPEC GRAV UA: 1.015
UROBILINOGEN UA: 0.2

## 2016-01-21 MED ORDER — TRAZODONE HCL 50 MG PO TABS: 25.0000 mg | ORAL_TABLET | Freq: Every evening | ORAL | Status: AC | PRN

## 2016-01-21 NOTE — Assessment & Plan Note (Addendum)
Weight is up, recently started back on lasix with plan for QOD dosing after today.  Recheck bmet given the minimal change with dosing so far.  At this point, still okay for outpatient f/u.  The urine changes (odor) may be incidental.  Check ucx before starting abx.

## 2016-01-21 NOTE — Telephone Encounter (Signed)
Thanks

## 2016-01-21 NOTE — Patient Instructions (Signed)
Go to the lab on the way out.  We'll contact you with your lab report. Don't change your meds for now.  Take care.  Glad to see you.  

## 2016-01-21 NOTE — Telephone Encounter (Signed)
Please check with [name].  Mother may have a UTI.  May need eval today.  Can a spot be opened up at 1:30? If so, I can get there early.   Let me know.  Thanks.  FYI- I may need the 4:15 for another issue/patient.   Much appreciated.

## 2016-01-21 NOTE — Telephone Encounter (Signed)
Per Dr. Para March patient has been added on at 4:15. [name] agreed.

## 2016-01-21 NOTE — Progress Notes (Signed)
Pre visit review using our clinic review tool, if applicable. No additional management support is needed unless otherwise documented below in the visit note.  Less UOP recently.  Weight is up.  She is back on lasix in the meantime.  The plan is to take lasix every other day for now.  She is taking lasix but not putting a lot out after the dose today.   No dysuria.  No burning.  Change in odor but that isn't new.  No FCNAVD. Some back pain at baseline.  No abd pain, except for rare discomfort with GI upset.    U/a noted.  D/w pt.  ucx pending.   Insomnia d/w pt.  See notes on plan.  Had used trazodone prev w/ relief, but concurrently on amiodarone.    Meds, vitals, and allergies reviewed.   ROS: See HPI.  Otherwise, noncontributory.  GEN: nad, alert and oriented HEENT: mucous membranes moist NECK: supple w/o LA CV: rrr.  PULM: ctab, no inc wob ABD: soft, +bs EXT: no edema

## 2016-01-21 NOTE — Telephone Encounter (Signed)
Talked with Dr. Shirlee Latch. Okay to use trazodone at low dose, prn for insomnia.  Don't go above 50mg .  Try 25mg  first.  Thanks.  Sent.

## 2016-01-21 NOTE — Assessment & Plan Note (Signed)
I need cards input, had used trazodone prev w/ relief, but concurrently on amiodarone.

## 2016-01-22 LAB — BASIC METABOLIC PANEL
BUN: 26 mg/dL — AB (ref 7–25)
CHLORIDE: 97 mmol/L — AB (ref 98–110)
CO2: 26 mmol/L (ref 20–31)
CREATININE: 1.73 mg/dL — AB (ref 0.60–0.88)
Calcium: 8.7 mg/dL (ref 8.6–10.4)
Glucose, Bld: 116 mg/dL — ABNORMAL HIGH (ref 65–99)
Potassium: 4.6 mmol/L (ref 3.5–5.3)
Sodium: 137 mmol/L (ref 135–146)

## 2016-01-22 NOTE — Telephone Encounter (Signed)
Daughter advised.

## 2016-01-23 LAB — URINE CULTURE: Colony Count: 60000

## 2016-01-26 ENCOUNTER — Telehealth: Payer: Self-pay

## 2016-01-26 NOTE — Telephone Encounter (Signed)
Request copy of 01/21/16 lab results. Done and given to Colin Mulders pts daughter.

## 2016-02-04 ENCOUNTER — Ambulatory Visit (HOSPITAL_COMMUNITY)
Admission: RE | Admit: 2016-02-04 | Discharge: 2016-02-04 | Disposition: A | Discharge status: Home / Self Care | Payer: Medicare Other | Source: Ambulatory Visit | Attending: Internal Medicine | Admitting: Internal Medicine

## 2016-02-04 VITALS — BP 120/62 | HR 100 | Wt 167.0 lb

## 2016-02-04 DIAGNOSIS — R001 Bradycardia, unspecified: Secondary | ICD-10-CM | POA: Diagnosis not present

## 2016-02-04 DIAGNOSIS — Z79899 Other long term (current) drug therapy: Secondary | ICD-10-CM | POA: Diagnosis not present

## 2016-02-04 DIAGNOSIS — I5022 Chronic systolic (congestive) heart failure: Secondary | ICD-10-CM | POA: Diagnosis not present

## 2016-02-04 DIAGNOSIS — I130 Hypertensive heart and chronic kidney disease with heart failure and stage 1 through stage 4 chronic kidney disease, or unspecified chronic kidney disease: Secondary | ICD-10-CM | POA: Insufficient documentation

## 2016-02-04 DIAGNOSIS — Z7984 Long term (current) use of oral hypoglycemic drugs: Secondary | ICD-10-CM | POA: Diagnosis not present

## 2016-02-04 DIAGNOSIS — E1122 Type 2 diabetes mellitus with diabetic chronic kidney disease: Secondary | ICD-10-CM | POA: Diagnosis not present

## 2016-02-04 DIAGNOSIS — Z950 Presence of cardiac pacemaker: Secondary | ICD-10-CM | POA: Insufficient documentation

## 2016-02-04 DIAGNOSIS — I2510 Atherosclerotic heart disease of native coronary artery without angina pectoris: Secondary | ICD-10-CM | POA: Diagnosis not present

## 2016-02-04 DIAGNOSIS — I4891 Unspecified atrial fibrillation: Secondary | ICD-10-CM | POA: Diagnosis not present

## 2016-02-04 DIAGNOSIS — M109 Gout, unspecified: Secondary | ICD-10-CM | POA: Insufficient documentation

## 2016-02-04 DIAGNOSIS — N183 Chronic kidney disease, stage 3 (moderate): Secondary | ICD-10-CM | POA: Insufficient documentation

## 2016-02-04 DIAGNOSIS — Z7901 Long term (current) use of anticoagulants: Secondary | ICD-10-CM | POA: Insufficient documentation

## 2016-02-04 DIAGNOSIS — I480 Paroxysmal atrial fibrillation: Secondary | ICD-10-CM

## 2016-02-04 DIAGNOSIS — E785 Hyperlipidemia, unspecified: Secondary | ICD-10-CM | POA: Insufficient documentation

## 2016-02-04 MED ORDER — ISOSORBIDE MONONITRATE ER 30 MG PO TB24: 30.0000 mg | ORAL_TABLET | Freq: Every day | ORAL | Status: DC

## 2016-02-04 MED ORDER — HYDRALAZINE HCL 25 MG PO TABS: 12.5000 mg | ORAL_TABLET | Freq: Three times a day (TID) | ORAL | Status: DC

## 2016-02-04 NOTE — Patient Instructions (Signed)
START Hydralazine 12.5 mg *(one half tab) three times per day START Imdur 30 mg, one tab daily  Your physician recommends that you schedule a follow-up appointment in: 6-8 weeks   Do the following things EVERYDAY: 1) Weigh yourself in the morning before breakfast. Write it down and keep it in a log. 2) Take your medicines as prescribed 3) Eat low salt foods-Limit salt (sodium) to 2000 mg per day.  4) Stay as active as you can everyday 5) Limit all fluids for the day to less than 2 liters 6)

## 2016-02-04 NOTE — Progress Notes (Signed)
Patient ID: [name], female   DOB: 02/26/1932, 82 y.o.   MRN: [id number]  PCP: Dr. Para March HF Cardiology: Dr. Shirlee Latch  82 yo with history of HTN, orthostatic hypotension, symptomatic bradycardia with Medtronic PPM, paroxysmal atrial fibrillation chronic systolic CHF, CKD stage III, and iron deficiency anemia presents for cardiology followup. Around the beginning of 9/16, she began to develop exertional dyspnea. She was seen in the cardiology office on 07/25/15. She was in atrial fibrillation with RVR and echo showed EF 35-40% (had been 50-55% in past). PPM interrogation showed that atrial fibrillation had begun 07/11/15, so exertional dyspnea had preceded this by about 4 weeks. She was admitted from 10/7-10/17. She was anemic with hemoglobin 8.7. She was heme negative but Fe deficient. EGD was not done. She had TEE-guided DCCV to NSR. She also had AKI with creatinine up to 3.41, but it trended back down. She was sent home in NSR. She was readmitted 10/19-10/23 with recurrent atrial fibrillation and acute on chronic systolic CHF. She was diuresed about 10 lbs. It was decided to rate control her rather than repeat cardioversion, and she was sent home. she was only at home for about a day and developed increased dyspnea so came back to the ER and was re-admitted.  She was diuresed with IV Lasix and started on amiodarone.  She underwent DCCV on 10/26 but atrial fibrillation recurred on 10/29.  More amiodarone was loaded, and she went back into NSR.   Lexiscan Cardiolite in 11/16 showed basal inferior/inferolatearl fixed defect consistent with infarction but no ischemia.    She returns for follow up. Last visit she had volume overload and she was instructed to take extra lasix for 2 days then every other day. Weight at home has not been accurate. Now has a new scale. Overall feeling ok. Denies SOB/PND/Orthopnea. Taking all medications. No bleeding problems. SBP at home has been running in the  150-160s.   ECG: 01/20/2016 a-paced, RBBB 71 bpm   Labs (7/16): LDL 48, HDL 55 Labs (10/16): K 3.9, creatinine 2.08 Labs (11/16): K 4.9, creatinine 2.5, LFTs normal, TSH normal, HCT 43.6 Labs (12/16): K 5.2, creatinine 1.73, HCT 41.9, plts 123 Labs 01/05/2016: K 5.5 Creatinine 2.08  Labs 01/13/2016: K 5.2 Creatinine 1.52   PMH: 1. HTN but also with history of orthostatic hypotension. 2. CKD stage III 3. Aortic stenosis: Mild 4. Symptomatic bradycardia s/p Medtronic PPM. 5. Hyperlipidemia 6. Atrial fibrillation: Paroxysmal. TEE-guided DCCV in 10/16. She had DCCV again latera in 10/16 and was started on amiodarone.   - PFTs (11/16) with normal spirometry, severe diffusion defect c/w pulmonary vascular disease.  7. Type II diabetes 8. Gout 9. Fe-deficiency anemia 10. Chronic systolic CHF: Echo 11/14 with EF 50-55%. Echo (10/16) with EF 35-40%, moderate LV dilation, diffuse hypokinesis, mild AS, moderate MR, PASP 48 mmHg.  Lexiscan Cardiolite (11/16) with EF 31%, basal inferior and inferolateral fixed defect possibly consistent with infarction, no ischemia; degree of LV hypokinesis was out of proportion to the perfusion defect.  Echo (1/17) with EF 45%, normal RV size with mildly decreased systolic function, aortic sclerosis without stenosis.  11. Esophageal dysmotility  SH: Lives alone but daughter is nearby.  Nonsmoker. No ETOH.    FH: No premature CAD.   ROS: All systems reviewed and negative except as per HPI.    Current Outpatient Prescriptions  Medication Sig Dispense Refill  . acetaminophen (TYLENOL) 500 MG tablet Take 500 mg by mouth every 8 (eight) hours as needed for  mild pain.    Marland Kitchen albuterol (PROVENTIL HFA;VENTOLIN HFA) 108 (90 BASE) MCG/ACT inhaler Inhale 2 puffs into the lungs every 6 (six) hours as needed for wheezing or shortness of breath. 1 Inhaler 0  . allopurinol (ZYLOPRIM) 300 MG tablet Take 1 tablet (300 mg total) by mouth daily. 90 tablet 3  . amiodarone  (PACERONE) 200 MG tablet Take 1 tablet (200 mg total) by mouth daily. 90 tablet 3  . apixaban (ELIQUIS) 2.5 MG TABS tablet Take 1 tablet (2.5 mg total) by mouth 2 (two) times daily. 60 tablet 11  . atorvastatin (LIPITOR) 20 MG tablet Take 1 tablet (20 mg total) by mouth daily at 6 PM. 30 tablet 6  . cetirizine (ZYRTEC) 10 MG tablet Take 10 mg by mouth daily.    . DULoxetine (CYMBALTA) 20 MG capsule Take 1 capsule (20 mg total) by mouth at bedtime. Take 1 tablet by mouth daily to help treat anxiety and nerves. 90 capsule 3  . furosemide (LASIX) 40 MG tablet Take 1 tablet (40 mg total) by mouth every other day. 15 tablet 6  . glimepiride (AMARYL) 1 MG tablet Take 1 tablet (1 mg total) by mouth daily with breakfast. 90 tablet 3  . metoprolol succinate (TOPROL-XL) 50 MG 24 hr tablet Take 1 tablet (50 mg total) by mouth 2 (two) times daily. 60 tablet 3  . nystatin (MYCOSTATIN) powder Apply topically 3 (three) times daily. 60 g 1  . ondansetron (ZOFRAN ODT) 4 MG disintegrating tablet Take 1 tablet (4 mg total) by mouth every 8 (eight) hours as needed for nausea or vomiting. 20 tablet 0  . pantoprazole (PROTONIX) 40 MG tablet Take 1 tablet (40 mg total) by mouth daily. 30 tablet 3  . saxagliptin HCl (ONGLYZA) 2.5 MG TABS tablet Take 1 tablet (2.5 mg total) by mouth daily. 30 tablet 2  . senna-docusate (SENOKOT-S) 8.6-50 MG tablet Take 1 tablet by mouth daily as needed for mild constipation.    . traZODone (DESYREL) 50 MG tablet Take 0.5-1 tablets (25-50 mg total) by mouth at bedtime as needed for sleep. Don't take >50mg  in a day. 30 tablet 3  . triamcinolone (KENALOG) 0.025 % cream Apply 1 application topically 4 (four) times daily as needed (itching).    . colchicine 0.6 MG tablet Take 0.5 tablets (0.3 mg total) by mouth daily as needed (for gout.  okay to fill with mitigare). (Patient not taking: Reported on 02/04/2016) 15 tablet 0   No current facility-administered medications for this encounter.   BP  120/62 mmHg  Pulse 100  Wt 167 lb (75.751 kg)  SpO2 100% General: NAD Daughter present  Neck: JVP  6-7  no thyromegaly or thyroid nodule.  Lungs: Clear to auscultation bilaterally with normal respiratory effort. CV: Nondisplaced PMI.  Heart regular S1/S2, no S3/S4, no murmur.  No peripheral edema.  No carotid bruit.  Normal pedal pulses.  Abdomen: Soft, nontender, no hepatosplenomegaly, non distended.  Skin: Intact without lesions or rashes.  Neurologic: Alert and oriented x 3.  Psych: Normal affect. Extremities: No clubbing or cyanosis.  HEENT: Normal.     Assessment/Plan: 1. Chronic systolic CHF: EF 78-93% by echo 10/16. Etiology uncertain: could be tachy-mediated cardiomyopathy, but symptoms seem to predate the onset of atrial fibrillation. Possible viral myocarditis. Cannot rule out ischemic cardiomyopathy, but no definite anginal symptoms. Cardiolite in 11/16 showed a small area possibly of prior infarction but EF and wall motion abnormalities were out of proportion to the perfusion defect. By  1/17, EF was improved to 45%.   NYHA class II symptoms.  Volume status stable. Continue lasix every other day.  Keep off spiro and lisinopril  with recent hyperkalemia.   - Continue Lasix 40 mg every other day. .  - Continue current Toprol XL.  - Add hydralalzine 12.5 mg tid and imdur 30 mg daily.  2. Atrial fibrillation: Tolerates poorly. A-paced s/p DCCV 08/06/15.  - Continue Eliquis - Toprol XL 50 mg bid.  - Continue amiodarone 200 mg daily for maintenance of NSR. She will need a regular eye exam.   3. CKD Stage III:  4. Medtronic PPM- H/O of symptomatic bradycardia. 5. CAD: Possible old infarction on Cardiolite.  No ischemia.  Would not cath with significant CKD and no chest pain.  Continue statin, good lipids in 7/16.  No aspirin given Eliquis use.   Follow up in  6- 8 weeks  Hildy Nicholl NP-C  02/04/2016

## 2016-02-19 ENCOUNTER — Other Ambulatory Visit: Payer: Self-pay | Admitting: *Deleted

## 2016-02-19 MED ORDER — SAXAGLIPTIN HCL 2.5 MG PO TABS: 2.5000 mg | ORAL_TABLET | Freq: Every day | ORAL | Status: DC

## 2016-02-27 ENCOUNTER — Other Ambulatory Visit (HOSPITAL_COMMUNITY): Payer: Self-pay | Admitting: Cardiology

## 2016-03-04 ENCOUNTER — Encounter: Payer: Self-pay | Admitting: Family Medicine

## 2016-03-05 ENCOUNTER — Ambulatory Visit (INDEPENDENT_AMBULATORY_CARE_PROVIDER_SITE_OTHER): Payer: Medicare Other | Admitting: Family Medicine

## 2016-03-05 ENCOUNTER — Telehealth: Payer: Self-pay | Admitting: Family Medicine

## 2016-03-05 ENCOUNTER — Encounter: Payer: Self-pay | Admitting: Family Medicine

## 2016-03-05 VITALS — BP 92/60 | HR 73 | Temp 98.1°F | Wt 172.0 lb

## 2016-03-05 DIAGNOSIS — R269 Unspecified abnormalities of gait and mobility: Secondary | ICD-10-CM

## 2016-03-05 DIAGNOSIS — R05 Cough: Secondary | ICD-10-CM | POA: Diagnosis not present

## 2016-03-05 DIAGNOSIS — R059 Cough, unspecified: Secondary | ICD-10-CM

## 2016-03-05 DIAGNOSIS — I5022 Chronic systolic (congestive) heart failure: Secondary | ICD-10-CM

## 2016-03-05 DIAGNOSIS — Z9181 History of falling: Secondary | ICD-10-CM | POA: Diagnosis not present

## 2016-03-05 MED ORDER — FLUTICASONE PROPIONATE 50 MCG/ACT NA SUSP: 2.0000 | Freq: Every day | NASAL | Status: DC

## 2016-03-05 NOTE — Progress Notes (Signed)
Pre visit review using our clinic review tool, if applicable. No additional management support is needed unless otherwise documented below in the visit note.  Low BP on checks today at clinic.  Unclear if BP cuff has been reading falsely high at home.  Weight is stable.  Some days she feels a little puffier than others but no signs of gross fluid overload o/w.    Still using her walker at home due to fall risk.  Using an elevated toilet seat would help her get on/off the toilet.  Hand written rx for elevated toilet seat given to patient.    Cough and rattle in the chest.  She attributed to allergies and post nasal gtt.  Still on zyrtec for allergies.  No fevers.  Not SOB.  Some rhinorrhea. Some alternating diarrhea and constipation.  No abd pain now.    Meds, vitals, and allergies reviewed.   ROS: Per HPI unless specifically indicated in ROS section   GEN: nad, alert and oriented HEENT: mucous membranes moist, tm wnl, nasal exam with slight congestion, OP wnl o/w NECK: supple w/o LA CV: rrr. PULM: ctab, no inc wob ABD: soft, +bs EXT: trace BLE edema SKIN: no acute rash

## 2016-03-05 NOTE — Telephone Encounter (Signed)
See mychart message.  Order for toilet seat given to patient at OV, due to gait abnormality and fall risk.

## 2016-03-05 NOTE — Patient Instructions (Signed)
Stop the hydralazine for now.  Send your BP cuff for [place] to check it against ours.  Use flonase for now along with the zyrtec.  Update [place] as needed.  Take care.  Glad to see you.

## 2016-03-08 DIAGNOSIS — R059 Cough, unspecified: Secondary | ICD-10-CM | POA: Insufficient documentation

## 2016-03-08 DIAGNOSIS — R05 Cough: Secondary | ICD-10-CM | POA: Insufficient documentation

## 2016-03-08 NOTE — Assessment & Plan Note (Signed)
Presumed over treated HTN, with possible false elevation of BP at home.  D/w pt.  Stop hydralazine for now, she'll get home cuff calibrated.  Okay for outpatient f/u.

## 2016-03-08 NOTE — Assessment & Plan Note (Signed)
Hand written rx for elevated toilet seat given to patient.  Continue walker use.

## 2016-03-08 NOTE — Assessment & Plan Note (Signed)
Looks be from post nasal gtt with lungs ctab at exam.  Doesn't appear to be in acute failure o/w.  Use flonase for now along with the zyrtec.  She'll update [place] as needed.  Okay for outpatient f/u.  >25 minutes spent in face to face time with patient, >50% spent in counselling or coordination of care.

## 2016-03-10 ENCOUNTER — Encounter: Payer: Self-pay | Admitting: Family Medicine

## 2016-03-18 ENCOUNTER — Telehealth: Payer: Self-pay

## 2016-03-18 ENCOUNTER — Other Ambulatory Visit: Payer: Self-pay

## 2016-03-18 MED ORDER — GLUCOSE BLOOD VI STRP: ORAL_STRIP | Status: DC

## 2016-03-18 NOTE — Telephone Encounter (Signed)
Danielle Benitez request refill onetouch ultra test strips to [facility]. Done per protocol and Danielle Benitez voiced understanding.

## 2016-03-18 NOTE — Telephone Encounter (Signed)
[name] the patients daughter called because someone called her to verify a refill from pharmacy. She was returning the call and to let us know her mother is no longer a patient of [facility].

## 2016-03-31 ENCOUNTER — Encounter (HOSPITAL_COMMUNITY): Payer: Medicare Other

## 2016-04-05 ENCOUNTER — Ambulatory Visit (INDEPENDENT_AMBULATORY_CARE_PROVIDER_SITE_OTHER): Payer: Medicare Other | Admitting: Family Medicine

## 2016-04-05 ENCOUNTER — Telehealth: Payer: Self-pay | Admitting: Family Medicine

## 2016-04-05 ENCOUNTER — Encounter: Payer: Self-pay | Admitting: Family Medicine

## 2016-04-05 VITALS — BP 120/70 | HR 89 | Temp 98.0°F | Wt 174.5 lb

## 2016-04-05 DIAGNOSIS — I5022 Chronic systolic (congestive) heart failure: Secondary | ICD-10-CM | POA: Diagnosis not present

## 2016-04-05 MED ORDER — ACETAMINOPHEN 325 MG PO TABS
325.0000 mg | ORAL_TABLET | Freq: Three times a day (TID) | ORAL | Status: DC | PRN
Start: 2016-04-05 — End: 2017-04-05

## 2016-04-05 NOTE — Patient Instructions (Signed)
Let me talk to cardiology about your hardware.  Keep it covered. We'll be in touch.  Use a heating pad (not on high) and tylenol as needed for the back ache.  Use your walker and limit home chores in the meantime.   Take care.  Glad to see you.

## 2016-04-05 NOTE — Progress Notes (Signed)
Pre visit review using our clinic review tool, if applicable. No additional management support is needed unless otherwise documented below in the visit note.  Pain worse with walking and some pain (though less) sitting.  Pain on L flank/side and L back/hip.  No falls.  She was recently pulling some heavy boxes.  Pain started after that.  Top of B feet are sore.   She isn't weak but the pain makes walking/balance more difficult for her.  No dysuria.  No fevers.  No vomiting.  Appetite is variable.  Weight is stable recently (but slowly trending up with ice cream and cereal, d/w pt about diet, family is trying to provide health options).  Still with some post nasal gtt.  Some SOB with exertion, slightly worse then baseline but unclear if that is pain related. She doesn't feel "puffy" and her face isn't edematous as she previously had been during CHF exacerbations.   Meds, vitals, and allergies reviewed.   ROS: Per HPI unless specifically indicated in ROS section   nad ncat rrr ctab No focal dec in BS L upper chest wall with what appears to be pacer hardware eroding through the skin.  Chronic irritation but this doesn't appear actively infected.   abd soft, normal BS, not ttp Back w/o midline pain, no cva pain, but L lower back ttp w/o bruising Ext w/o edema Intact DP pulses.  Varicose veins noted on the B dorsal feet.

## 2016-04-05 NOTE — Telephone Encounter (Signed)
If the other patient cancelled, see if she can come in.  Thanks.

## 2016-04-05 NOTE — Telephone Encounter (Signed)
Appointment scheduled.

## 2016-04-05 NOTE — Assessment & Plan Note (Signed)
She doesn't appear to be fluid overloaded.  Her weight had trended up from what appears to be true weight gain, not fluid overload.  dw pt about diet.  In the meantime, L upper chest wall with what appears to be pacer hardware eroding through the skin.  Doesn't appear infected but mildly irritated.  I covered with a bandage and I'll check with cardiology in the meantime.   Her back pain appears incidental and should resolved, with tylenol and local heat with routine cautions.  See AVS.   At this point, still okay for outpatient f/u.  >25 minutes spent in face to face time with patient, >50% spent in counselling or coordination of care.

## 2016-04-05 NOTE — Telephone Encounter (Signed)
Pt is having back pain and side pain.  Daughter would like to know if a slot could be opened for late this afternoon or if pt is ok to wait to make an appt later this week.   Please advise, thank you  Melissa

## 2016-04-06 ENCOUNTER — Ambulatory Visit (INDEPENDENT_AMBULATORY_CARE_PROVIDER_SITE_OTHER): Payer: Medicare Other | Admitting: Internal Medicine

## 2016-04-06 ENCOUNTER — Encounter: Payer: Self-pay | Admitting: Internal Medicine

## 2016-04-06 ENCOUNTER — Telehealth (HOSPITAL_COMMUNITY): Payer: Self-pay | Admitting: Cardiology

## 2016-04-06 ENCOUNTER — Other Ambulatory Visit: Payer: Self-pay | Admitting: Family Medicine

## 2016-04-06 DIAGNOSIS — R001 Bradycardia, unspecified: Secondary | ICD-10-CM

## 2016-04-06 DIAGNOSIS — Z950 Presence of cardiac pacemaker: Secondary | ICD-10-CM

## 2016-04-06 DIAGNOSIS — T829XXA Unspecified complication of cardiac and vascular prosthetic device, implant and graft, initial encounter: Secondary | ICD-10-CM

## 2016-04-06 DIAGNOSIS — I4891 Unspecified atrial fibrillation: Secondary | ICD-10-CM | POA: Diagnosis not present

## 2016-04-06 LAB — CUP PACEART INCLINIC DEVICE CHECK
Brady Statistic AS VP Percent: 0.02 %
Brady Statistic RA Percent Paced: 99.79 %
Brady Statistic RV Percent Paced: 0.37 %
Date Time Interrogation Session: 20170627145446
Implantable Lead Location: 753859
Implantable Lead Model: 5086
Lead Channel Setting Pacing Amplitude: 2 V
Lead Channel Setting Pacing Pulse Width: 0.4 ms
Lead Channel Setting Sensing Sensitivity: 0.9 mV
MDC IDC LEAD IMPLANT DT: 20130402
MDC IDC LEAD IMPLANT DT: 20130402
MDC IDC LEAD LOCATION: 753860
MDC IDC LEAD MODEL: 5086
MDC IDC MSMT BATTERY VOLTAGE: 2.97 V
MDC IDC MSMT LEADCHNL RA IMPEDANCE VALUE: 336 Ohm
MDC IDC MSMT LEADCHNL RV IMPEDANCE VALUE: 528 Ohm
MDC IDC MSMT LEADCHNL RV SENSING INTR AMPL: 4.163 mV
MDC IDC SET LEADCHNL RV PACING AMPLITUDE: 2.5 V
MDC IDC STAT BRADY AP VP PERCENT: 0.35 %
MDC IDC STAT BRADY AP VS PERCENT: 99.44 %
MDC IDC STAT BRADY AS VS PERCENT: 0.19 %

## 2016-04-06 LAB — CBC WITH DIFFERENTIAL/PLATELET
BASOS PCT: 0 %
Basophils Absolute: 0 cells/uL (ref 0–200)
EOS ABS: 106 {cells}/uL (ref 15–500)
Eosinophils Relative: 2 %
HEMATOCRIT: 36.1 % (ref 35.0–45.0)
HEMOGLOBIN: 11.4 g/dL — AB (ref 11.7–15.5)
LYMPHS ABS: 1749 {cells}/uL (ref 850–3900)
LYMPHS PCT: 33 %
MCH: 28.4 pg (ref 27.0–33.0)
MCHC: 31.6 g/dL — ABNORMAL LOW (ref 32.0–36.0)
MCV: 89.8 fL (ref 80.0–100.0)
MONO ABS: 530 {cells}/uL (ref 200–950)
MPV: 11.8 fL (ref 7.5–12.5)
Monocytes Relative: 10 %
Neutro Abs: 2915 cells/uL (ref 1500–7800)
Neutrophils Relative %: 55 %
Platelets: 166 10*3/uL (ref 140–400)
RBC: 4.02 MIL/uL (ref 3.80–5.10)
RDW: 15.9 % — AB (ref 11.0–15.0)
WBC: 5.3 10*3/uL (ref 3.8–10.8)

## 2016-04-06 MED ORDER — DOXYCYCLINE HYCLATE 100 MG PO TABS: 100.0000 mg | ORAL_TABLET | Freq: Two times a day (BID) | ORAL | Status: DC

## 2016-04-06 NOTE — Progress Notes (Signed)
Call pt, daughter.  D/w Dr. Shirlee Latch.  Should start doxy in the meantime.  rx sent.  They will work on getting patient into the pacer clinic asap.  Thanks.  I thank all involved in the meantime.

## 2016-04-06 NOTE — Progress Notes (Signed)
Patient Care Team: Joaquim Nam, MD as PCP - General (Family Medicine) Judene Companion, MD as Attending Physician (Cardiology) Myrene Buddy, MD as Attending Physician (Orthopedic Surgery) Hal Marzetta Board., MD as Attending Physician (Ophthalmology) Frederico Hamman, MD as Consulting Physician (Orthopedic Surgery) Romero Belling, MD as Referring Physician (Physical Medicine and Rehabilitation)   HPI  Danielle Benitez is a 82 y.o. female Seen as an add-on to device clinic. She is  4 years status post pacemaker insertion for symptomatic bradycardia and sinus node dysfunction with atrial pacer dependence.  Over the weekend her daughter noted what she thought was a scab on the lateral aspect of her pacemaker incision. She went to see her primary care physician who noted that the lead was extruding  She has had no fevers or chills  Past Medical History  Diagnosis Date  . Gout   . Osteoarthritis   . S/P cardiac pacemaker procedure, PPM Medtronic REVO placed 01/11/2012 01/11/2012    a. for symptomatic bradycardia. Followed by Dr. Ladona Ridgel.  . Personal history of fall   . Memory loss   . Anxiety state, unspecified   . Other and unspecified hyperlipidemia   . Anemia, unspecified   . Depressive disorder, not elsewhere classified   . Lumbago   . Nonspecific abnormal results of liver function study   . Obesity, unspecified   . Internal hemorrhoids without mention of complication   . Abnormality of gait   . Pain in joint, shoulder region 11/22/2013    left   . Pruritus 11/22/2013  . Eczema 11/22/2013  . Full dentures   . HOH (hard of hearing)   . Wears glasses   . Female stress incontinence 05/06/2015  . DM (diabetes mellitus), type 2, uncontrolled, with renal complications (HCC) 01/08/2012  . CKD (chronic kidney disease), stage III   . Aortic stenosis     a. Mild by echo 08/2013.  . Essential hypertension   . Orthostatic hypotension   . Bifascicular block   . Chronic systolic  CHF (congestive heart failure) (HCC)     a. Dx 07/2015 - EF 35-40%, unclear etiology  . Mild CAD     a. Cath 2013: 20-30% prox RCA.  Marland Kitchen Anticoagulated- Eliquis 07/31/2015  . Atrial fibrillation - developed 07/11/15 by PPM interrogation, recurrent after DCCV 10/14 07/11/2015     recurrent after DCCV 10/14  . Presence of permanent cardiac pacemaker   . Esophageal dysmotility 08/05/2015    Past Surgical History  Procedure Laterality Date  . Extremity cyst excision    . Cardiac catheterization  12/03/2003    Dr Clarene Duke  . Colonoscopy      ?????  . Orif wrist fracture Right 04/09/2014    Procedure: OPEN REDUCTION INTERNAL FIXATION (ORIF) RIGHT DISPLACED  DISTAL RADIUS  FRACTURE;  Surgeon: Dominica Severin, MD;  Location: [facility];  Service: Orthopedics;  Laterality: Right;  . Left heart catheterization with coronary angiogram N/A 01/10/2012    Procedure: LEFT HEART CATHETERIZATION WITH CORONARY ANGIOGRAM;  Surgeon: Chrystie Nose, MD;  Location: [facility] CATH LAB;  Service: Cardiovascular;  Laterality: N/A;  . Permanent pacemaker insertion Left 01/11/2012    Procedure: PERMANENT PACEMAKER INSERTION;  Surgeon: Thurmon Fair, MD;  Location: [facility] CATH LAB;  Service: Cardiovascular;  Laterality: Left;  . Tee without cardioversion N/A 07/25/2015    Procedure: TRANSESOPHAGEAL ECHOCARDIOGRAM (TEE);  Surgeon: Pricilla Riffle, MD;  Location: [facility] ENDOSCOPY;  Service: Cardiovascular;  Laterality: N/A;  .  Cardioversion N/A 07/25/2015    Procedure: CARDIOVERSION;  Surgeon: Pricilla Riffle, MD;  Location: [facility] ENDOSCOPY;  Service: Cardiovascular;  Laterality: N/A;  . Cataract extraction w/ intraocular lens  implant, bilateral Bilateral 03/2015-04/2015  . Cardioversion N/A 08/06/2015    Procedure: CARDIOVERSION;  Surgeon: Laurey Morale, MD;  Location: [facility] ENDOSCOPY;  Service: Cardiovascular;  Laterality: N/A;  . Esophagogastroduodenoscopy N/A 08/08/2015    Procedure: ESOPHAGOGASTRODUODENOSCOPY (EGD);  Surgeon:  Iva Boop, MD;  Location: [facility] ENDOSCOPY;  Service: Endoscopy;  Laterality: N/A;    Current Outpatient Prescriptions  Medication Sig Dispense Refill  . acetaminophen (TYLENOL) 325 MG tablet Take 1-2 tablets (325-650 mg total) by mouth 3 (three) times daily as needed.    Marland Kitchen albuterol (PROVENTIL HFA;VENTOLIN HFA) 108 (90 BASE) MCG/ACT inhaler Inhale 2 puffs into the lungs every 6 (six) hours as needed for wheezing or shortness of breath. 1 Inhaler 0  . allopurinol (ZYLOPRIM) 300 MG tablet Take 1 tablet (300 mg total) by mouth daily. 90 tablet 3  . amiodarone (PACERONE) 200 MG tablet Take 1 tablet (200 mg total) by mouth daily. 90 tablet 3  . apixaban (ELIQUIS) 2.5 MG TABS tablet Take 1 tablet (2.5 mg total) by mouth 2 (two) times daily. 60 tablet 11  . atorvastatin (LIPITOR) 20 MG tablet Take 1 tablet (20 mg total) by mouth daily at 6 PM. 30 tablet 6  . cetirizine (ZYRTEC) 10 MG tablet Take 10 mg by mouth daily.    . colchicine 0.6 MG tablet Take 0.5 tablets (0.3 mg total) by mouth daily as needed (for gout.  okay to fill with mitigare). 15 tablet 0  . doxycycline (VIBRA-TABS) 100 MG tablet Take 1 tablet (100 mg total) by mouth 2 (two) times daily. 20 tablet 0  . DULoxetine (CYMBALTA) 20 MG capsule Take 1 capsule (20 mg total) by mouth at bedtime. Take 1 tablet by mouth daily to help treat anxiety and nerves. 90 capsule 3  . fluticasone (FLONASE) 50 MCG/ACT nasal spray Place 2 sprays into both nostrils daily. 16 g 1  . furosemide (LASIX) 40 MG tablet Take 1 tablet (40 mg total) by mouth every other day. 15 tablet 6  . glimepiride (AMARYL) 1 MG tablet Take 1 tablet (1 mg total) by mouth daily with breakfast. 90 tablet 3  . glucose blood (ONE TOUCH ULTRA TEST) test strip Check blood sugar once daily and as directed. Dx E11.29 100 each 1  . isosorbide mononitrate (IMDUR) 30 MG 24 hr tablet Take 1 tablet (30 mg total) by mouth daily. 30 tablet 6  . metoprolol succinate (TOPROL-XL) 50 MG 24 hr tablet  TAKE 1 TABLET BY MOUTH TWICE A DAY 60 tablet 6  . nystatin (MYCOSTATIN) powder Apply topically 3 (three) times daily. 60 g 1  . ondansetron (ZOFRAN ODT) 4 MG disintegrating tablet Take 1 tablet (4 mg total) by mouth every 8 (eight) hours as needed for nausea or vomiting. 20 tablet 0  . pantoprazole (PROTONIX) 40 MG tablet Take 1 tablet (40 mg total) by mouth daily. 30 tablet 3  . saxagliptin HCl (ONGLYZA) 2.5 MG TABS tablet Take 1 tablet (2.5 mg total) by mouth daily. 30 tablet 2  . senna-docusate (SENOKOT-S) 8.6-50 MG tablet Take 1 tablet by mouth daily as needed for mild constipation.    . Simethicone (GAS-X MAXIMUM STRENGTH PO) Take by mouth daily.    . traZODone (DESYREL) 50 MG tablet Take 0.5-1 tablets (25-50 mg total) by mouth at bedtime as needed for  sleep. Don't take >50mg  in a day. 30 tablet 3  . triamcinolone (KENALOG) 0.025 % cream Apply 1 application topically 4 (four) times daily as needed (itching).     No current facility-administered medications for this visit.    Allergies  Allergen Reactions  . Beta Adrenergic Blockers Other (See Comments)    Couldn't speak or hear Metoprolol seems ok  . Jardiance [Empagliflozin] Other (See Comments)    Rash, kidney issues      Review of Systems negative except from HPI and PMH  Physical Exam 120/70 Well developed and well nourished in no acute distress HENT normal E scleral and icterus clear Neck Supple JVP flat; carotids brisk and full Clear to ausculation  Extrusion of the lead lateral aspect of the pocket*Regular rate and rhythm, no murmurs gallops or rub Soft with active bowel sounds No clubbing cyanosis  Edema Alert and oriented, grossly normal motor and sensory function Skin Warm and Dry    Assessment and  Plan     sinus node dysfunction with atrial lead dependence   Erosion    The patient has erosion and will need explantation of her device. She is atrial lead dependence and as such may require temporary  permanent pacing. We'll arrange follow-up with Dr. Ladona Ridgel at earliest convenience. She is not sick currently there is drainage. We will not use antibiotics

## 2016-04-06 NOTE — Telephone Encounter (Signed)
-----   Message from Laurey Morale, MD sent at 04/05/2016 10:53 PM EDT ----- Regarding: FW: pacer/skin problem This patient has a pacemaker erosion through skin, needs to be seen in pacer clinic asap (next day or so).  Please arrange.   ----- Message -----    From: Joaquim Nam, MD    Sent: 04/05/2016  10:47 PM      To: Laurey Morale, MD Subject: pacer/skin problem                             Incidentally noted on the L upper chest wall pacer hardware eroding through the skin.  Not tender.  Doesn't appear infected and likely isn't an acute change.  I covered it and I need to know what to do.  I've never seen this happen.    I would appreciate any input from you.   Many thanks.   Danielle Benitez

## 2016-04-06 NOTE — Telephone Encounter (Signed)
Detailed message left for melissa tatum, EP scheduler, to schedule ASAP  Will forward to her as well

## 2016-04-06 NOTE — Progress Notes (Signed)
Daughter advised.

## 2016-04-07 ENCOUNTER — Ambulatory Visit (INDEPENDENT_AMBULATORY_CARE_PROVIDER_SITE_OTHER): Payer: Medicare Other | Admitting: Internal Medicine

## 2016-04-07 ENCOUNTER — Encounter: Payer: Self-pay | Admitting: Internal Medicine

## 2016-04-07 ENCOUNTER — Encounter: Payer: Self-pay | Admitting: *Deleted

## 2016-04-07 VITALS — BP 126/62 | HR 73 | Ht 60.0 in | Wt 174.4 lb

## 2016-04-07 DIAGNOSIS — Z950 Presence of cardiac pacemaker: Secondary | ICD-10-CM | POA: Diagnosis not present

## 2016-04-07 NOTE — Patient Instructions (Addendum)
Medication Instructions:  Your physician recommends that you continue on your current medications as directed. Please refer to the Current Medication list given to you today.   Labwork: None ordered    Testing/Procedures:  Device extraction on 04/12/16 with Dr. Ladona Ridgel at 7:30 am  Please report to the [facility] entrance at [facility] at 5:30 am on the day of your procedure.  Do not eat drink anything the night prior to your procedure.  Do not take nay medications the morning of your procedure.  Plan for a one night hospital stay.   Follow-Up: will be scheduled post hospital  Any Other Special Instructions Will Be Listed Below (If Applicable).      If you need a refill on your cardiac medications before your next appointment, please call your pharmacy.

## 2016-04-07 NOTE — Progress Notes (Signed)
HPI Danielle Benitez returns today for followup having had her PPM lead come through her skin. She is a very pleasant elderly woman with a history of symptomatic bradycardia, status post permanent pacemaker insertion. Approximately a week ago, she was noted to have some itching over her PM site. She denies scratching the incision and has not had any fever or chills. She was seen by her primary MD and found to have the lead coming through the skin. She denies any recent infections.  Allergies  Allergen Reactions  . Beta Adrenergic Blockers Other (See Comments)    Couldn't speak or hear Metoprolol seems ok  . Jardiance [Empagliflozin] Other (See Comments)    Rash, kidney issues     Current Outpatient Prescriptions  Medication Sig Dispense Refill  . acetaminophen (TYLENOL) 325 MG tablet Take 1-2 tablets (325-650 mg total) by mouth 3 (three) times daily as needed.    Marland Kitchen albuterol (PROVENTIL HFA;VENTOLIN HFA) 108 (90 BASE) MCG/ACT inhaler Inhale 2 puffs into the lungs every 6 (six) hours as needed for wheezing or shortness of breath. 1 Inhaler 0  . allopurinol (ZYLOPRIM) 300 MG tablet Take 1 tablet (300 mg total) by mouth daily. 90 tablet 3  . amiodarone (PACERONE) 200 MG tablet Take 1 tablet (200 mg total) by mouth daily. 90 tablet 3  . apixaban (ELIQUIS) 2.5 MG TABS tablet Take 1 tablet (2.5 mg total) by mouth 2 (two) times daily. 60 tablet 11  . atorvastatin (LIPITOR) 20 MG tablet Take 1 tablet (20 mg total) by mouth daily at 6 PM. 30 tablet 6  . cetirizine (ZYRTEC) 10 MG tablet Take 10 mg by mouth daily.    . colchicine 0.6 MG tablet Take 0.5 tablets (0.3 mg total) by mouth daily as needed (for gout.  okay to fill with mitigare). 15 tablet 0  . doxycycline (VIBRA-TABS) 100 MG tablet Take 1 tablet (100 mg total) by mouth 2 (two) times daily. 20 tablet 0  . DULoxetine (CYMBALTA) 20 MG capsule Take 1 capsule (20 mg total) by mouth at bedtime. Take 1 tablet by mouth daily to help treat anxiety and nerves.  90 capsule 3  . fluticasone (FLONASE) 50 MCG/ACT nasal spray Place 2 sprays into both nostrils daily. 16 g 1  . furosemide (LASIX) 40 MG tablet Take 1 tablet (40 mg total) by mouth every other day. 15 tablet 6  . glimepiride (AMARYL) 1 MG tablet Take 1 tablet (1 mg total) by mouth daily with breakfast. 90 tablet 3  . glucose blood (ONE TOUCH ULTRA TEST) test strip Check blood sugar once daily and as directed. Dx E11.29 100 each 1  . isosorbide mononitrate (IMDUR) 30 MG 24 hr tablet Take 1 tablet (30 mg total) by mouth daily. 30 tablet 6  . metoprolol succinate (TOPROL-XL) 50 MG 24 hr tablet TAKE 1 TABLET BY MOUTH TWICE A DAY 60 tablet 6  . nystatin (MYCOSTATIN) powder Apply topically 3 (three) times daily. 60 g 1  . ondansetron (ZOFRAN ODT) 4 MG disintegrating tablet Take 1 tablet (4 mg total) by mouth every 8 (eight) hours as needed for nausea or vomiting. 20 tablet 0  . pantoprazole (PROTONIX) 40 MG tablet Take 1 tablet (40 mg total) by mouth daily. 30 tablet 3  . saxagliptin HCl (ONGLYZA) 2.5 MG TABS tablet Take 1 tablet (2.5 mg total) by mouth daily. 30 tablet 2  . senna-docusate (SENOKOT-S) 8.6-50 MG tablet Take 1 tablet by mouth daily as needed for mild constipation.    Marland Kitchen  Simethicone (GAS-X MAXIMUM STRENGTH PO) Take by mouth daily.    . traZODone (DESYREL) 50 MG tablet Take 0.5-1 tablets (25-50 mg total) by mouth at bedtime as needed for sleep. Don't take >50mg  in a day. 30 tablet 3  . triamcinolone (KENALOG) 0.025 % cream Apply 1 application topically 4 (four) times daily as needed (itching).     No current facility-administered medications for this visit.     Past Medical History  Diagnosis Date  . Gout   . Osteoarthritis   . S/P cardiac pacemaker procedure, PPM Medtronic REVO placed 01/11/2012 01/11/2012    a. for symptomatic bradycardia. Followed by Dr. Ladona Ridgel.  . Personal history of fall   . Memory loss   . Anxiety state, unspecified   . Other and unspecified hyperlipidemia   .  Anemia, unspecified   . Depressive disorder, not elsewhere classified   . Lumbago   . Nonspecific abnormal results of liver function study   . Obesity, unspecified   . Internal hemorrhoids without mention of complication   . Abnormality of gait   . Pain in joint, shoulder region 11/22/2013    left   . Pruritus 11/22/2013  . Eczema 11/22/2013  . Full dentures   . HOH (hard of hearing)   . Wears glasses   . Female stress incontinence 05/06/2015  . DM (diabetes mellitus), type 2, uncontrolled, with renal complications (HCC) 01/08/2012  . CKD (chronic kidney disease), stage III   . Aortic stenosis     a. Mild by echo 08/2013.  . Essential hypertension   . Orthostatic hypotension   . Bifascicular block   . Chronic systolic CHF (congestive heart failure) (HCC)     a. Dx 07/2015 - EF 35-40%, unclear etiology  . Mild CAD     a. Cath 2013: 20-30% prox RCA.  Marland Kitchen Anticoagulated- Eliquis 07/31/2015  . Atrial fibrillation - developed 07/11/15 by PPM interrogation, recurrent after DCCV 10/14 07/11/2015     recurrent after DCCV 10/14  . Presence of permanent cardiac pacemaker   . Esophageal dysmotility 08/05/2015    ROS:   All systems reviewed and negative except as noted in the HPI.   Past Surgical History  Procedure Laterality Date  . Extremity cyst excision    . Cardiac catheterization  12/03/2003    Dr Clarene Duke  . Colonoscopy      ?????  . Orif wrist fracture Right 04/09/2014    Procedure: OPEN REDUCTION INTERNAL FIXATION (ORIF) RIGHT DISPLACED  DISTAL RADIUS  FRACTURE;  Surgeon: Dominica Severin, MD;  Location: [facility];  Service: Orthopedics;  Laterality: Right;  . Left heart catheterization with coronary angiogram N/A 01/10/2012    Procedure: LEFT HEART CATHETERIZATION WITH CORONARY ANGIOGRAM;  Surgeon: Chrystie Nose, MD;  Location: [facility] CATH LAB;  Service: Cardiovascular;  Laterality: N/A;  . Permanent pacemaker insertion Left 01/11/2012    Procedure: PERMANENT PACEMAKER  INSERTION;  Surgeon: Thurmon Fair, MD;  Location: [facility] CATH LAB;  Service: Cardiovascular;  Laterality: Left;  . Tee without cardioversion N/A 07/25/2015    Procedure: TRANSESOPHAGEAL ECHOCARDIOGRAM (TEE);  Surgeon: Pricilla Riffle, MD;  Location: [facility] ENDOSCOPY;  Service: Cardiovascular;  Laterality: N/A;  . Cardioversion N/A 07/25/2015    Procedure: CARDIOVERSION;  Surgeon: Pricilla Riffle, MD;  Location: [facility] ENDOSCOPY;  Service: Cardiovascular;  Laterality: N/A;  . Cataract extraction w/ intraocular lens  implant, bilateral Bilateral 03/2015-04/2015  . Cardioversion N/A 08/06/2015    Procedure: CARDIOVERSION;  Surgeon: Laurey Morale, MD;  Location: [facility] ENDOSCOPY;  Service: Cardiovascular;  Laterality: N/A;  . Esophagogastroduodenoscopy N/A 08/08/2015    Procedure: ESOPHAGOGASTRODUODENOSCOPY (EGD);  Surgeon: Iva Boop, MD;  Location: [facility] ENDOSCOPY;  Service: Endoscopy;  Laterality: N/A;     Family History  Problem Relation Age of Onset  . Breast cancer Mother   . Cancer Mother   . Diabetes Daughter   . Hypertension Daughter   . Cancer Brother      Social History   Social History  . Marital Status: Widowed    Spouse Name: N/A  . Number of Children: N/A  . Years of Education: N/A   Occupational History  . Not on file.   Social History Main Topics  . Smoking status: Never Smoker   . Smokeless tobacco: Never Used  . Alcohol Use: No  . Drug Use: No  . Sexual Activity: No   Other Topics Concern  . Not on file   Social History Narrative   Physically inactive. She says she hurts in her back and knees too much to do much walking.   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 Well-healed pacemaker incision. HEART:  Regular rate rhythm, no murmurs, no rubs, no clicks Abd:  soft, positive bowel sounds, no organomegally, no rebound, no guarding Ext:  2 plus pulses, no edema, no cyanosis, no clubbing Skin:  No rashes no nodules Neuro:  CN II through XII intact, motor grossly intact   DEVICE  She is PM dependent  Assess/Plan: 1. PM pocket infection - she is not systemically ill. She will undergo PM system extraction next week. I have discussed the indications, risks, benefits, goals, and expectations of the procedure with the patient and she wishes to proceed.  2. Sinus node dysfunction - she had no escape on device interogation yesterday. I would anticipate the placement of a temporary permanent PM and device re-implantation several days after initial extraction. 3. CAD - she denies anginal symptoms. 4. Chronic renal insufficiency - will hope to avoid hypotension during her procedure and any nephrotoxic drugs.   Leonia Reeves.D.

## 2016-04-09 ENCOUNTER — Encounter (HOSPITAL_COMMUNITY): Payer: Self-pay | Admitting: *Deleted

## 2016-04-09 ENCOUNTER — Telehealth: Payer: Self-pay | Admitting: Internal Medicine

## 2016-04-09 MED ORDER — SODIUM CHLORIDE 0.9 % IR SOLN
80.0000 mg | Status: AC
  Administered 2016-04-12: 80 mg
  Filled 2016-04-09 (×3): qty 2

## 2016-04-09 MED ORDER — CEFAZOLIN SODIUM-DEXTROSE 2-4 GM/100ML-% IV SOLN
2.0000 g | INTRAVENOUS | Status: AC
  Administered 2016-04-12: 2 g via INTRAVENOUS
  Filled 2016-04-09: qty 100

## 2016-04-09 MED ORDER — SODIUM CHLORIDE 0.9 % IV SOLN
INTRAVENOUS | Status: DC
  Administered 2016-04-12: 07:00:00 via INTRAVENOUS

## 2016-04-09 MED ORDER — SODIUM CHLORIDE 0.9 % IR SOLN: 80.0000 mg | Status: DC

## 2016-04-09 NOTE — Progress Notes (Signed)
Anesthesia Chart Review: SAME DAY WORK-UP.  Patient is a 82 year old female scheduled for pacemaker lead removal on 04/12/16 by Dr. Ladona Ridgel. DX: PM pocket infection. A PPM lead came through her skin. He anticipated that she will need placement of a temporary permanent pacemaker and device re-implantation several days after initial extraction.   History includes symptomatic bradycardia s/p Medtronic PPM 01/11/12, afib s/p DCCV 16, mild CAD '13, chronic systolic CHF, mild AS '14, HTN, CKD stage III, DM2, hard of hearing, anemia, anxiety, depression, non-smoker.  PCP is Dr. Crawford Givens. Primary cardiologist is Dr. Jens Som. EP cardiologist is Dr. Ladona Ridgel. HF cardiologist is Dr. Shirlee Latch.  Meds include albuterol, allopurinol, amiodarone, Eliquis, Lipitor, Zyrtec, colchicine, Cymbalta, Flonase, Lasix, Amaryl, Imdur, Toprol XL, Zofran, Onglyza, trazodone. Eliquis to be held after Sunday AM dose.  01/20/16 EKG: A-paced rhythm with prolonged AV conduction, LAD, right BBB, T wave abnormality, consider inferolateral ischemia.   10/31/15 Echo: Study Conclusions - Left ventricle: The cavity size was normal. Wall thickness was  increased in a pattern of mild LVH. The estimated ejection  fraction was 45%. Diffuse hypokinesis. Doppler parameters are  consistent with abnormal left ventricular relaxation (grade 1  diastolic dysfunction). GLS -16.2%, mildly abnormal. - Aortic valve: Trileaflet; moderately calcified leaflets.  Sclerosis without stenosis. - Mitral valve: Mildly calcified annulus. There was no significant  regurgitation. - Left atrium: The atrium was mildly dilated. - Right ventricle: The cavity size was normal. Pacer wire or  catheter noted in right ventricle. Systolic function was mildly  reduced. - Tricuspid valve: Peak RV-RA gradient (S): 19 mm Hg. - Pulmonary arteries: PA peak pressure: 22 mm Hg (S). - Inferior vena cava: The vessel was normal in size. The  respirophasic diameter  changes were in the normal range (>= 50%),  consistent with normal central venous pressure. Recommendations: Normal LV size with mild LV hypertrophy. EF 45%, diffuse hypokinesis. Normal RV size with mildly decreased systolic function. Aortic sclerosis without significant stenosis. (07/17/15 Echo showed LVEF 35-40%, mild AS, moderate MR/TR.)  09/12/15 Nuclear stress test:  Nuclear stress EF: 31%.  There was no ST segment deviation noted during stress.  No T wave inversion was noted during stress.  Defect 1: There is a medium defect of moderate severity present in the basal inferior and basal inferolateral location.  Findings consistent with prior myocardial infarction.  This is an intermediate risk study.  The left ventricular ejection fraction is moderately decreased (30-44%). Intermediate risk stress nuclear study with a fixed basal inferolateral defect consistent with scar and moderately reduced global LV systolic function. No significant reversible ischemia is seen. The degree of LV hypokinesis is out of proportion with the extent of the perfusion abnormality, suggesting mixed ischemic/nonischemic cardiomyopathy  01/10/12 Cardiac cath: EF 65%. Normal wall motion. No significant MR. LM normal but calcified distally. LAD with mild luminal irregularities. D1, LCX, OM1, PDA, PL branch with no significant obstruction. 20-30% proximal RCA with mild luminal irregularities.  Impression: No significant obstructive CAD. Symptomatic bradycardia with HR in the 30's. LVEF 65%, no WMA's, no regurgitation. HTN with high LVEDP.  Plan: External pacer at bedside. PPM tomorrow. BP control.   11/13/15 CXR: IMPRESSION: Mild cardiomegaly without CHF or pneumonia.  CBC noted from 04/06/16. A1c 7.5 on 12/31/15. Cr since 12/31/15-01/21/16 was 1.52-2.08. Additional labs to be done on arrival.  Velna Ochs Ku [facility] Short Stay Center/Anesthesiology Phone [phone] 04/09/2016 12:07 PM

## 2016-04-09 NOTE — Progress Notes (Signed)
Mrs Buckel lives with her daughter, Colin Mulders.  Ms Charmian Muff took the [facility] call for patient. Patient ambulates with a walker that has a seat.  Patient gets short of breath with ambulation, but continues to walk fast.  Mrs Sawdy is a Type II Diabetic treated with oral medications.  Last A1C was 7.5, March 22/2017.  Patient's daughter reports that the lowest CBG she has had recently ( fasting) was 86, highest was 188.  Mrs Cardiel has hip pain , which she is taking Tylenol for. I asked for patient to check CBG 4  Times a day 2 days prior to surgery and to adjust food intake as neded, goa lis for CBG to be less than 180. I instructed Ms Charmian Muff to check CBG to check CBG the morning of surgery and if it is less than 70 to treat it with Glucose Gel, Glucose tablets or 1/2 cup of clear juice like apple juice or cranberry juice, or 1/2 cup of regular soda. (not cream soda). I instructed patient to recheck CBG in 15 minutes and if CBG is not greater than 70, to  Call [phone] (pre- op). If it is before pre-op opens to retreat as before and recheck CBG in 15 minutes. I told patient to make note of time that liquid is taken and amount, that surgical time may have to be adjusted.

## 2016-04-09 NOTE — Progress Notes (Signed)
Jana Half with [employer] notified of patient surgery on 04/12/16.

## 2016-04-09 NOTE — Telephone Encounter (Signed)
done

## 2016-04-09 NOTE — Telephone Encounter (Signed)
Patient scheduled to have lead removal 7/3.  To Dr. Ladona Ridgel.

## 2016-04-09 NOTE — Telephone Encounter (Signed)
New Message:   Needs orders put in the computer for surgical procedure on Monday.

## 2016-04-12 ENCOUNTER — Ambulatory Visit (HOSPITAL_COMMUNITY): Payer: Medicare Other | Admitting: Vascular Surgery

## 2016-04-12 ENCOUNTER — Ambulatory Visit (HOSPITAL_COMMUNITY)
Admission: RE | Admit: 2016-04-12 | Discharge: 2016-04-16 | Disposition: A | Discharge status: Home / Self Care | Payer: Medicare Other | Source: Ambulatory Visit | Attending: Internal Medicine | Admitting: Internal Medicine

## 2016-04-12 ENCOUNTER — Ambulatory Visit (HOSPITAL_COMMUNITY): Payer: Medicare Other

## 2016-04-12 ENCOUNTER — Encounter (HOSPITAL_COMMUNITY): Payer: Self-pay | Admitting: *Deleted

## 2016-04-12 ENCOUNTER — Telehealth (HOSPITAL_COMMUNITY): Payer: Self-pay | Admitting: Vascular Surgery

## 2016-04-12 ENCOUNTER — Encounter (HOSPITAL_COMMUNITY)
Admission: RE | Disposition: A | Discharge status: Home / Self Care | Payer: Self-pay | Source: Ambulatory Visit | Attending: Internal Medicine

## 2016-04-12 DIAGNOSIS — M1990 Unspecified osteoarthritis, unspecified site: Secondary | ICD-10-CM | POA: Diagnosis not present

## 2016-04-12 DIAGNOSIS — I480 Paroxysmal atrial fibrillation: Secondary | ICD-10-CM | POA: Diagnosis not present

## 2016-04-12 DIAGNOSIS — E1122 Type 2 diabetes mellitus with diabetic chronic kidney disease: Secondary | ICD-10-CM | POA: Diagnosis not present

## 2016-04-12 DIAGNOSIS — T829XXA Unspecified complication of cardiac and vascular prosthetic device, implant and graft, initial encounter: Secondary | ICD-10-CM | POA: Diagnosis not present

## 2016-04-12 DIAGNOSIS — I2510 Atherosclerotic heart disease of native coronary artery without angina pectoris: Secondary | ICD-10-CM | POA: Diagnosis not present

## 2016-04-12 DIAGNOSIS — I495 Sick sinus syndrome: Secondary | ICD-10-CM | POA: Diagnosis not present

## 2016-04-12 DIAGNOSIS — M25562 Pain in left knee: Secondary | ICD-10-CM

## 2016-04-12 DIAGNOSIS — I130 Hypertensive heart and chronic kidney disease with heart failure and stage 1 through stage 4 chronic kidney disease, or unspecified chronic kidney disease: Secondary | ICD-10-CM | POA: Insufficient documentation

## 2016-04-12 DIAGNOSIS — Z950 Presence of cardiac pacemaker: Secondary | ICD-10-CM | POA: Diagnosis present

## 2016-04-12 DIAGNOSIS — I5042 Chronic combined systolic (congestive) and diastolic (congestive) heart failure: Secondary | ICD-10-CM | POA: Diagnosis not present

## 2016-04-12 DIAGNOSIS — T827XXA Infection and inflammatory reaction due to other cardiac and vascular devices, implants and grafts, initial encounter: Secondary | ICD-10-CM | POA: Insufficient documentation

## 2016-04-12 DIAGNOSIS — Z7901 Long term (current) use of anticoagulants: Secondary | ICD-10-CM | POA: Insufficient documentation

## 2016-04-12 DIAGNOSIS — T82519A Breakdown (mechanical) of unspecified cardiac and vascular devices and implants, initial encounter: Secondary | ICD-10-CM | POA: Diagnosis not present

## 2016-04-12 DIAGNOSIS — N183 Chronic kidney disease, stage 3 (moderate): Secondary | ICD-10-CM | POA: Insufficient documentation

## 2016-04-12 DIAGNOSIS — Y838 Other surgical procedures as the cause of abnormal reaction of the patient, or of later complication, without mention of misadventure at the time of the procedure: Secondary | ICD-10-CM | POA: Diagnosis not present

## 2016-04-12 DIAGNOSIS — K219 Gastro-esophageal reflux disease without esophagitis: Secondary | ICD-10-CM | POA: Diagnosis not present

## 2016-04-12 DIAGNOSIS — I5022 Chronic systolic (congestive) heart failure: Secondary | ICD-10-CM | POA: Diagnosis not present

## 2016-04-12 HISTORY — DX: Acute myocardial infarction, unspecified: I21.9

## 2016-04-12 HISTORY — DX: Gastro-esophageal reflux disease without esophagitis: K21.9

## 2016-04-12 HISTORY — PX: PACEMAKER LEAD REMOVAL: SHX5064

## 2016-04-12 LAB — GLUCOSE, CAPILLARY
GLUCOSE-CAPILLARY: 153 mg/dL — AB (ref 65–99)
GLUCOSE-CAPILLARY: 176 mg/dL — AB (ref 65–99)
GLUCOSE-CAPILLARY: 284 mg/dL — AB (ref 65–99)
GLUCOSE-CAPILLARY: 219 mg/dL — AB (ref 65–99)
Glucose-Capillary: 151 mg/dL — ABNORMAL HIGH (ref 65–99)

## 2016-04-12 LAB — ABO/RH: ABO/RH(D): A NEG

## 2016-04-12 LAB — BASIC METABOLIC PANEL
ANION GAP: 6 (ref 5–15)
BUN: 26 mg/dL — ABNORMAL HIGH (ref 6–20)
CHLORIDE: 105 mmol/L (ref 101–111)
CO2: 27 mmol/L (ref 22–32)
CREATININE: 1.56 mg/dL — AB (ref 0.44–1.00)
Calcium: 9.3 mg/dL (ref 8.9–10.3)
GFR calc non Af Amer: 30 mL/min — ABNORMAL LOW (ref >60)
GFR, EST AFRICAN AMERICAN: 35 mL/min — AB (ref >60)
Glucose, Bld: 154 mg/dL — ABNORMAL HIGH (ref 65–99)
POTASSIUM: 4.4 mmol/L (ref 3.5–5.1)
SODIUM: 138 mmol/L (ref 135–145)

## 2016-04-12 LAB — PROTIME-INR
INR: 1.16 (ref 0.00–1.49)
PROTHROMBIN TIME: 15 s (ref 11.6–15.2)

## 2016-04-12 LAB — CBC
HEMATOCRIT: 36.6 % (ref 36.0–46.0)
HEMOGLOBIN: 11 g/dL — AB (ref 12.0–15.0)
MCH: 28.6 pg (ref 26.0–34.0)
MCHC: 30.1 g/dL (ref 30.0–36.0)
MCV: 95.1 fL (ref 78.0–100.0)
PLATELETS: 142 10*3/uL — AB (ref 150–400)
RBC: 3.85 MIL/uL — AB (ref 3.87–5.11)
RDW: 15.6 % — ABNORMAL HIGH (ref 11.5–15.5)
WBC: 6.7 10*3/uL (ref 4.0–10.5)

## 2016-04-12 LAB — PREPARE RBC (CROSSMATCH)

## 2016-04-12 LAB — SURGICAL PCR SCREEN
MRSA, PCR: NEGATIVE
STAPHYLOCOCCUS AUREUS: POSITIVE — AB

## 2016-04-12 SURGERY — REMOVAL, ELECTRODE LEAD, CARDIAC PACEMAKER, WITHOUT REPLACEMENT
Anesthesia: General | Site: Chest

## 2016-04-12 MED ORDER — SUGAMMADEX SODIUM 200 MG/2ML IV SOLN
INTRAVENOUS | Status: AC
  Filled 2016-04-12: qty 2

## 2016-04-12 MED ORDER — LIDOCAINE 2% (20 MG/ML) 5 ML SYRINGE
INTRAMUSCULAR | Status: AC
  Filled 2016-04-12: qty 5

## 2016-04-12 MED ORDER — ONDANSETRON HCL 4 MG/2ML IJ SOLN
INTRAMUSCULAR | Status: DC | PRN
  Administered 2016-04-12: 4 mg via INTRAVENOUS

## 2016-04-12 MED ORDER — CHLORHEXIDINE GLUCONATE 4 % EX LIQD: 60.0000 mL | Freq: Once | CUTANEOUS | Status: DC

## 2016-04-12 MED ORDER — OXYCODONE HCL 5 MG/5ML PO SOLN: 5.0000 mg | Freq: Once | ORAL | Status: DC | PRN

## 2016-04-12 MED ORDER — PROPOFOL 10 MG/ML IV BOLUS
INTRAVENOUS | Status: AC
  Filled 2016-04-12: qty 20

## 2016-04-12 MED ORDER — SUGAMMADEX SODIUM 200 MG/2ML IV SOLN
INTRAVENOUS | Status: DC | PRN
  Administered 2016-04-12: 100 mg via INTRAVENOUS

## 2016-04-12 MED ORDER — ACETAMINOPHEN 160 MG/5ML PO SOLN
325.0000 mg | ORAL | Status: DC | PRN
  Filled 2016-04-12: qty 20.3

## 2016-04-12 MED ORDER — INSULIN ASPART 100 UNIT/ML ~~LOC~~ SOLN
0.0000 [IU] | Freq: Every day | SUBCUTANEOUS | Status: DC
Start: 2016-04-12 — End: 2016-04-16
  Administered 2016-04-12 – 2016-04-15 (×3): 2 [IU] via SUBCUTANEOUS

## 2016-04-12 MED ORDER — ALBUTEROL SULFATE (2.5 MG/3ML) 0.083% IN NEBU: 3.0000 mL | INHALATION_SOLUTION | Freq: Four times a day (QID) | RESPIRATORY_TRACT | Status: DC | PRN

## 2016-04-12 MED ORDER — ROCURONIUM BROMIDE 50 MG/5ML IV SOLN
INTRAVENOUS | Status: AC
  Filled 2016-04-12: qty 1

## 2016-04-12 MED ORDER — YOU HAVE A PACEMAKER BOOK
Freq: Once | Status: AC
  Administered 2016-04-12: 14:00:00
  Filled 2016-04-12: qty 1

## 2016-04-12 MED ORDER — OXYCODONE HCL 5 MG PO TABS: 5.0000 mg | ORAL_TABLET | Freq: Once | ORAL | Status: DC | PRN

## 2016-04-12 MED ORDER — LIDOCAINE 2% (20 MG/ML) 5 ML SYRINGE
INTRAMUSCULAR | Status: AC
Start: 2016-04-12 — End: 2016-04-12
  Filled 2016-04-12: qty 5

## 2016-04-12 MED ORDER — ACETAMINOPHEN 325 MG PO TABS: 325.0000 mg | ORAL_TABLET | ORAL | Status: DC | PRN

## 2016-04-12 MED ORDER — ROCURONIUM BROMIDE 100 MG/10ML IV SOLN
INTRAVENOUS | Status: DC | PRN
  Administered 2016-04-12: 40 mg via INTRAVENOUS
  Administered 2016-04-12: 10 mg via INTRAVENOUS

## 2016-04-12 MED ORDER — FUROSEMIDE 40 MG PO TABS: 40.0000 mg | ORAL_TABLET | ORAL | Status: DC

## 2016-04-12 MED ORDER — AMIODARONE HCL 200 MG PO TABS
200.0000 mg | ORAL_TABLET | Freq: Every day | ORAL | Status: DC
  Administered 2016-04-13 – 2016-04-16 (×4): 200 mg via ORAL
  Filled 2016-04-12 (×4): qty 1

## 2016-04-12 MED ORDER — ISOSORBIDE MONONITRATE ER 30 MG PO TB24
30.0000 mg | ORAL_TABLET | Freq: Every day | ORAL | Status: DC
  Administered 2016-04-13 – 2016-04-16 (×4): 30 mg via ORAL
  Filled 2016-04-12 (×4): qty 1

## 2016-04-12 MED ORDER — CEFAZOLIN IN D5W 1 GM/50ML IV SOLN
1.0000 g | Freq: Four times a day (QID) | INTRAVENOUS | Status: AC
  Administered 2016-04-12 – 2016-04-13 (×3): 1 g via INTRAVENOUS
  Filled 2016-04-12 (×3): qty 50

## 2016-04-12 MED ORDER — FUROSEMIDE 40 MG PO TABS
40.0000 mg | ORAL_TABLET | ORAL | Status: DC
  Filled 2016-04-12: qty 1

## 2016-04-12 MED ORDER — LIDOCAINE HCL (PF) 1 % IJ SOLN
INTRAMUSCULAR | Status: DC | PRN
  Administered 2016-04-12: 30 mL

## 2016-04-12 MED ORDER — LIDOCAINE HCL (PF) 1 % IJ SOLN
INTRAMUSCULAR | Status: AC
  Filled 2016-04-12: qty 30

## 2016-04-12 MED ORDER — FENTANYL CITRATE (PF) 100 MCG/2ML IJ SOLN: 25.0000 ug | INTRAMUSCULAR | Status: DC | PRN

## 2016-04-12 MED ORDER — INSULIN ASPART 100 UNIT/ML ~~LOC~~ SOLN
0.0000 [IU] | Freq: Three times a day (TID) | SUBCUTANEOUS | Status: DC
  Administered 2016-04-13: 8 [IU] via SUBCUTANEOUS
  Administered 2016-04-13: 3 [IU] via SUBCUTANEOUS
  Administered 2016-04-13: 8 [IU] via SUBCUTANEOUS
  Administered 2016-04-14 (×2): 3 [IU] via SUBCUTANEOUS
  Administered 2016-04-14 – 2016-04-15 (×2): 8 [IU] via SUBCUTANEOUS
  Administered 2016-04-15 – 2016-04-16 (×2): 3 [IU] via SUBCUTANEOUS
  Administered 2016-04-16: 8 [IU] via SUBCUTANEOUS

## 2016-04-12 MED ORDER — LIDOCAINE HCL (CARDIAC) 20 MG/ML IV SOLN
INTRAVENOUS | Status: DC | PRN
  Administered 2016-04-12: 30 mg via INTRAVENOUS
  Administered 2016-04-12: 40 mg via INTRAVENOUS

## 2016-04-12 MED ORDER — TRAZODONE HCL 50 MG PO TABS: 25.0000 mg | ORAL_TABLET | Freq: Every evening | ORAL | Status: DC | PRN

## 2016-04-12 MED ORDER — PHENYLEPHRINE 40 MCG/ML (10ML) SYRINGE FOR IV PUSH (FOR BLOOD PRESSURE SUPPORT)
PREFILLED_SYRINGE | INTRAVENOUS | Status: AC
  Filled 2016-04-12: qty 10

## 2016-04-12 MED ORDER — PROPOFOL 10 MG/ML IV BOLUS
INTRAVENOUS | Status: DC | PRN
  Administered 2016-04-12: 20 mg via INTRAVENOUS
  Administered 2016-04-12: 110 mg via INTRAVENOUS

## 2016-04-12 MED ORDER — DULOXETINE HCL 20 MG PO CPEP
20.0000 mg | ORAL_CAPSULE | Freq: Every day | ORAL | Status: DC
  Administered 2016-04-12 – 2016-04-15 (×4): 20 mg via ORAL
  Filled 2016-04-12 (×5): qty 1

## 2016-04-12 MED ORDER — ONDANSETRON HCL 4 MG/2ML IJ SOLN: 4.0000 mg | Freq: Four times a day (QID) | INTRAMUSCULAR | Status: DC | PRN

## 2016-04-12 MED ORDER — EPHEDRINE 5 MG/ML INJ
INTRAVENOUS | Status: AC
  Filled 2016-04-12: qty 10

## 2016-04-12 MED ORDER — PHENYLEPHRINE HCL 10 MG/ML IJ SOLN
10.0000 mg | INTRAVENOUS | Status: DC | PRN
  Administered 2016-04-12: 40 ug/min via INTRAVENOUS

## 2016-04-12 MED ORDER — SUCCINYLCHOLINE CHLORIDE 200 MG/10ML IV SOSY
PREFILLED_SYRINGE | INTRAVENOUS | Status: AC
  Filled 2016-04-12: qty 10

## 2016-04-12 MED ORDER — CHLORHEXIDINE GLUCONATE CLOTH 2 % EX PADS
6.0000 | MEDICATED_PAD | Freq: Every day | CUTANEOUS | Status: DC
  Administered 2016-04-12 – 2016-04-16 (×4): 6 via TOPICAL

## 2016-04-12 MED ORDER — PANTOPRAZOLE SODIUM 40 MG PO TBEC
40.0000 mg | DELAYED_RELEASE_TABLET | Freq: Every day | ORAL | Status: DC
  Administered 2016-04-13 – 2016-04-16 (×4): 40 mg via ORAL
  Filled 2016-04-12 (×4): qty 1

## 2016-04-12 MED ORDER — ALLOPURINOL 300 MG PO TABS
300.0000 mg | ORAL_TABLET | Freq: Every day | ORAL | Status: DC
  Administered 2016-04-12 – 2016-04-16 (×5): 300 mg via ORAL
  Filled 2016-04-12 (×5): qty 1

## 2016-04-12 MED ORDER — APIXABAN 2.5 MG PO TABS
2.5000 mg | ORAL_TABLET | Freq: Two times a day (BID) | ORAL | Status: DC
  Administered 2016-04-13 – 2016-04-16 (×7): 2.5 mg via ORAL
  Filled 2016-04-12 (×7): qty 1

## 2016-04-12 MED ORDER — MUPIROCIN 2 % EX OINT
1.0000 "application " | TOPICAL_OINTMENT | Freq: Two times a day (BID) | CUTANEOUS | Status: DC
  Administered 2016-04-12 – 2016-04-16 (×8): 1 via NASAL
  Filled 2016-04-12 (×2): qty 22

## 2016-04-12 MED ORDER — ONDANSETRON 4 MG PO TBDP: 4.0000 mg | ORAL_TABLET | Freq: Three times a day (TID) | ORAL | Status: DC | PRN

## 2016-04-12 MED ORDER — ATORVASTATIN CALCIUM 20 MG PO TABS
20.0000 mg | ORAL_TABLET | Freq: Every day | ORAL | Status: DC
  Administered 2016-04-12 – 2016-04-15 (×4): 20 mg via ORAL
  Filled 2016-04-12 (×4): qty 1

## 2016-04-12 MED ORDER — LORATADINE 10 MG PO TABS
10.0000 mg | ORAL_TABLET | Freq: Every day | ORAL | Status: DC
  Administered 2016-04-12 – 2016-04-16 (×5): 10 mg via ORAL
  Filled 2016-04-12 (×5): qty 1

## 2016-04-12 MED ORDER — FENTANYL CITRATE (PF) 250 MCG/5ML IJ SOLN
INTRAMUSCULAR | Status: AC
  Filled 2016-04-12: qty 5

## 2016-04-12 MED ORDER — ONDANSETRON HCL 4 MG/2ML IJ SOLN
INTRAMUSCULAR | Status: AC
  Filled 2016-04-12: qty 2

## 2016-04-12 MED ORDER — PHENYLEPHRINE HCL 10 MG/ML IJ SOLN
INTRAMUSCULAR | Status: DC | PRN
  Administered 2016-04-12: 80 ug via INTRAVENOUS
  Administered 2016-04-12: 40 ug via INTRAVENOUS
  Administered 2016-04-12: 80 ug via INTRAVENOUS

## 2016-04-12 MED ORDER — FLUTICASONE PROPIONATE 50 MCG/ACT NA SUSP
2.0000 | Freq: Every day | NASAL | Status: DC
  Administered 2016-04-12 – 2016-04-16 (×5): 2 via NASAL
  Filled 2016-04-12: qty 16

## 2016-04-12 MED ORDER — METOPROLOL SUCCINATE ER 50 MG PO TB24
50.0000 mg | ORAL_TABLET | Freq: Two times a day (BID) | ORAL | Status: DC
  Administered 2016-04-12 – 2016-04-16 (×8): 50 mg via ORAL
  Filled 2016-04-12 (×8): qty 1

## 2016-04-12 MED ORDER — HEPARIN SODIUM (PORCINE) 5000 UNIT/ML IJ SOLN
INTRAMUSCULAR | Status: DC | PRN
  Administered 2016-04-12: 500 mL

## 2016-04-12 MED ORDER — ACETAMINOPHEN 325 MG PO TABS
325.0000 mg | ORAL_TABLET | ORAL | Status: DC | PRN
  Administered 2016-04-13 – 2016-04-15 (×4): 650 mg via ORAL
  Filled 2016-04-12 (×5): qty 2

## 2016-04-12 MED ORDER — SENNOSIDES-DOCUSATE SODIUM 8.6-50 MG PO TABS: 1.0000 | ORAL_TABLET | Freq: Every day | ORAL | Status: DC | PRN

## 2016-04-12 MED ORDER — LACTATED RINGERS IV SOLN
INTRAVENOUS | Status: DC | PRN
  Administered 2016-04-12: 07:00:00 via INTRAVENOUS

## 2016-04-12 MED ORDER — FENTANYL CITRATE (PF) 100 MCG/2ML IJ SOLN
INTRAMUSCULAR | Status: DC | PRN
  Administered 2016-04-12: 100 ug via INTRAVENOUS

## 2016-04-12 SURGICAL SUPPLY — 54 items
APL SKNCLS STERI-STRIP NONHPOA (GAUZE/BANDAGES/DRESSINGS) ×1
BAG BANDED W/RUBBER/TAPE 36X54 (MISCELLANEOUS) ×3 IMPLANT
BAG EQP BAND 135X91 W/RBR TAPE (MISCELLANEOUS) ×1
BENZOIN TINCTURE PRP APPL 2/3 (GAUZE/BANDAGES/DRESSINGS) ×2 IMPLANT
BLADE 10 SAFETY STRL DISP (BLADE) ×3 IMPLANT
BLADE OSCILLATING /SAGITTAL (BLADE) IMPLANT
BLADE STERNUM SYSTEM 6 (BLADE) ×3 IMPLANT
BNDG ADH 5X4 AIR PERM ELC (GAUZE/BANDAGES/DRESSINGS)
BNDG COHESIVE 4X5 WHT NS (GAUZE/BANDAGES/DRESSINGS) IMPLANT
CANISTER SUCTION 2500CC (MISCELLANEOUS) ×3 IMPLANT
CLOSURE STERI-STRIP 1/2X4 (GAUZE/BANDAGES/DRESSINGS) ×1
CLSR STERI-STRIP ANTIMIC 1/2X4 (GAUZE/BANDAGES/DRESSINGS) ×1 IMPLANT
COIL ONE TIE COMPRESSION (MISCELLANEOUS) ×4 IMPLANT
COVER TABLE BACK 60X90 (DRAPES) ×3 IMPLANT
DEVICE TORQUE KENDALL .025-038 (MISCELLANEOUS) ×2 IMPLANT
DRAPE CARDIOVASCULAR INCISE (DRAPES) ×3
DRAPE SRG 135X102X78XABS (DRAPES) ×1 IMPLANT
DRSG OPSITE 6X11 MED (GAUZE/BANDAGES/DRESSINGS) ×4 IMPLANT
DRSG TEGADERM 4X4.75 (GAUZE/BANDAGES/DRESSINGS) ×2 IMPLANT
ELECT REM PT RETURN 9FT ADLT (ELECTROSURGICAL) ×6
ELECTRODE REM PT RTRN 9FT ADLT (ELECTROSURGICAL) ×2 IMPLANT
GAUZE SPONGE 4X4 12PLY STRL (GAUZE/BANDAGES/DRESSINGS) IMPLANT
GAUZE SPONGE 4X4 16PLY XRAY LF (GAUZE/BANDAGES/DRESSINGS) IMPLANT
GLOVE BIOGEL PI IND STRL 6.5 (GLOVE) IMPLANT
GLOVE BIOGEL PI IND STRL 7.0 (GLOVE) IMPLANT
GLOVE BIOGEL PI IND STRL 7.5 (GLOVE) ×1 IMPLANT
GLOVE BIOGEL PI INDICATOR 6.5 (GLOVE) ×4
GLOVE BIOGEL PI INDICATOR 7.0 (GLOVE) ×2
GLOVE BIOGEL PI INDICATOR 7.5 (GLOVE) ×2
GLOVE ECLIPSE 8.0 STRL XLNG CF (GLOVE) ×3 IMPLANT
GOWN STRL REUS W/ TWL LRG LVL3 (GOWN DISPOSABLE) IMPLANT
GOWN STRL REUS W/ TWL XL LVL3 (GOWN DISPOSABLE) ×1 IMPLANT
GOWN STRL REUS W/TWL LRG LVL3 (GOWN DISPOSABLE) ×6
GOWN STRL REUS W/TWL XL LVL3 (GOWN DISPOSABLE) ×3
GUIDEWIRE ANGLED .035X150CM (WIRE) ×2 IMPLANT
KIT ROOM TURNOVER OR (KITS) ×3 IMPLANT
LEAD CAPSURE NOVUS 5076-58CM (Lead) ×2 IMPLANT
NDL PERC 18GX7CM (NEEDLE) IMPLANT
NEEDLE PERC 18GX7CM (NEEDLE) ×3 IMPLANT
PAD ARMBOARD 7.5X6 YLW CONV (MISCELLANEOUS) ×6 IMPLANT
PAD ELECT DEFIB RADIOL ZOLL (MISCELLANEOUS) ×3 IMPLANT
SHEATH EVOLUTION RL 11F (SHEATH) ×2 IMPLANT
SHEATH EVOLUTION SHORTE RL 11F (SHEATH) ×2 IMPLANT
SHEATH PINNACLE 6F 10CM (SHEATH) ×2 IMPLANT
SPONGE GAUZE 4X4 12PLY STER LF (GAUZE/BANDAGES/DRESSINGS) ×4 IMPLANT
STYLET LIBERATOR LOCKING (MISCELLANEOUS) ×4 IMPLANT
SUT PROLENE 2 0 SH DA (SUTURE) ×2 IMPLANT
SUT SILK 1 SH (SUTURE) ×2 IMPLANT
TOWEL OR 17X24 6PK STRL BLUE (TOWEL DISPOSABLE) ×6 IMPLANT
TOWEL OR 17X26 10 PK STRL BLUE (TOWEL DISPOSABLE) ×6 IMPLANT
TRAY FOLEY IC TEMP SENS 16FR (CATHETERS) ×3 IMPLANT
TUBE CONNECTING 12'X1/4 (SUCTIONS)
TUBE CONNECTING 12X1/4 (SUCTIONS) IMPLANT
YANKAUER SUCT BULB TIP NO VENT (SUCTIONS) ×2 IMPLANT

## 2016-04-12 NOTE — Progress Notes (Signed)
Call placed to [facility].  Clarified-hold Eliquis today.  OK to enter order for loratidine.  Will cont to monitor.

## 2016-04-12 NOTE — Progress Notes (Signed)
  Echocardiogram Echocardiogram Transesophageal has been performed.  Danielle Benitez M 04/12/2016, 8:15 AM 

## 2016-04-12 NOTE — Telephone Encounter (Signed)
Pt daughter called to cancel upcoming appt pt is in the hospital , pacemaker removal. She wants someone from clinic to come see her while here

## 2016-04-12 NOTE — Anesthesia Preprocedure Evaluation (Signed)
Anesthesia Evaluation  Patient identified by MRN, date of birth, ID band Patient awake    Reviewed: Allergy & Precautions, NPO status , Patient's Chart, lab work & pertinent test results, reviewed documented beta blocker date and time   History of Anesthesia Complications Negative for: history of anesthetic complications  Airway Mallampati: III  TM Distance: <3 FB Neck ROM: Full    Dental  (+) Teeth Intact, Partial Lower, Partial Upper   Pulmonary neg shortness of breath, neg sleep apnea, COPD, neg recent URI, neg PE   breath sounds clear to auscultation       Cardiovascular hypertension, Pt. on medications and Pt. on home beta blockers (-) angina+ CAD, + Past MI and +CHF  + dysrhythmias Atrial Fibrillation + pacemaker  Rhythm:Regular     Neuro/Psych Anxiety Depression  Neuromuscular disease    GI/Hepatic Neg liver ROS, GERD  ,  Endo/Other  diabetes, Type 2  Renal/GU Renal disease     Musculoskeletal  (+) Arthritis ,   Abdominal   Peds  Hematology  (+) anemia ,   Anesthesia Other Findings   Reproductive/Obstetrics                             Anesthesia Physical Anesthesia Plan  ASA: III  Anesthesia Plan: General   Post-op Pain Management:    Induction: Intravenous  Airway Management Planned: Oral ETT  Additional Equipment: Arterial line  Intra-op Plan:   Post-operative Plan: Extubation in OR  Informed Consent: I have reviewed the patients History and Physical, chart, labs and discussed the procedure including the risks, benefits and alternatives for the proposed anesthesia with the patient or authorized representative who has indicated his/her understanding and acceptance.   Dental advisory given  Plan Discussed with: CRNA and Surgeon  Anesthesia Plan Comments:         Anesthesia Quick Evaluation

## 2016-04-12 NOTE — Op Note (Signed)
EP procedure note  Procedure performed: Extraction of a dual-chamber pacing system and insertion of a temporary permanent transvenous pacemaker.  Preoperative diagnosis: Pacemaker system infection in a patient with profound sinus node dysfunction and pacemaker dependence  Postoperative diagnosis: Same as preoperative diagnosis  Description of the procedure: After informed consent was obtained, the patient was taken to the operating room in the fasting state. Anesthesia service was utilized to provide invasive hemodynamic monitoring and general endotracheal anesthesia. A transesophageal echo probe was placed. There is no vegetation on the pacing leads. A 6 French sheath was placed in the right femoral vein for additional central venous access. 20 cc of lidocaine was infiltrated into the left pectoral region. The left subclavian vein was then punctured percutaneously and a Glidewire utilized to advance into the central circulation. It should be noted that the patient's left subclavian vein was occluded. A 7 French sheath was advanced over the guidewire and through the occluded vein and then a 58 cm Medtronic model 5076 active fixation pacing lead was advanced under fluoroscopic guidance into the right ventricle. The lead was actively fixed on the right ventricular septum. R waves measured 8 mV. The patient threshold was less than a volt at 0.5 ms and the pacing impedance was 869 ohms. There is a large injury current with active fixation of the lead. The lead was then secured to the skin with silk suture. The sewing sleeve was also secured to the skin with silk suture. The lead was connected to pacing cables. At this point attention was directed at removal of the patient's infected pacing system.  The patient's pacing lead was sticking through the skin. A 6 cm incision was carried out around the pacing lead. Electrocautery was utilized to dissect down to the pacemaker generator. The pacing leads were freed up  from their fibrous adhesions. There was only minimal amounts of necrotic tissue present. After the leads and generator were freed up the generator was removed. Additional scar tissue was removed from around the pacing leads down to the sewing sleeve. Stylets were inserted into the pacing leads and the helix retracted in each lead. At this point the ventricular lead was cut. A Cook Liberator locking stylette was inserted into the ventricular pacing lead all the way down to the tip of the lead and locked in place. The proximal portion of the lead was secured to the stylette with a one tie suture. An 74 French Cook RL short dissection sheath was advanced over the ventricular pacing lead and there was very dense fibrous adhesions noted. Utilizing a combination of pressure, counter pressure, traction, and countertraction, the short dissection sheath was advanced through the tissue in the subclavian vein and onto the innominate vein. At this point the short dissection sheath was removed and the 11 [place] long Cook RL dissection sheath was advanced back over the lead and into the vein. At the junction between the innominate vein and the superior vena cava, there was very dense fibrous scar tissue present. After several minutes of work, the scar tissue was traversed and at this point we made fairly good progress down to the right atrial RV junction. The lead was then removed in total. There was no hemodynamic sequelae. At this point, attention was turned to the atrial lead. The temporary pacemaker was turned on. The atrial lead was disconnected from the pacemaker generator. A stylette was used to retract the helix and gentle traction was then placed on the lead, and it was removed in  total. At this point hemostasis was assured with gentle pressure. The pocket was irrigated with antibiotic irrigation. Additional hemostasis was confirmed with electrocautery. The incision was closed with 2-0 Prolene mattress sutures. The  patient's pacemaker generator was connected to the new temporary pacing lead and secured to the skin with silk suture. The 6 French sheath which had been placed in the patient's right femoral vein was removed and pressure held and hemostasis obtained. She was returned to the recovery area in satisfactory condition.  Complications: There were no immediate procedure complications  Estimated blood loss: Less than 40 cc  Conclusion: Successful extraction of a permanent dual-chamber pacemaker which had eroded through the skin followed by successful insertion of a single chamber temporary permanent active fixation pacing lead attached to the patient's old pacemaker generator and secured to the skin.  Lewayne Bunting, M.D.

## 2016-04-12 NOTE — Telephone Encounter (Signed)
Message sent to HF providers

## 2016-04-12 NOTE — Progress Notes (Signed)
Orthopedic Tech Progress Note Patient Details:  Danielle Benitez [birth year] [id number]  Ortho Devices Type of Ortho Device: Arm sling Ortho Device/Splint Location: lue Ortho Device/Splint Interventions: Application   Danielle Benitez 04/12/2016, 12:20 PM

## 2016-04-12 NOTE — Transfer of Care (Signed)
Immediate Anesthesia Transfer of Care Note  Patient: Danielle Benitez  Procedure(s) Performed: Procedure(s): PACEMAKER LEAD REMOVAL (N/A) TEMPORARY PACEMAKER INSERTION (N/A)  Patient Location: PACU  Anesthesia Type:General  Level of Consciousness: awake, alert , oriented and patient cooperative  Airway & Oxygen Therapy: Patient Spontanous Breathing and Patient connected to nasal cannula oxygen  Post-op Assessment: Report given to RN, Post -op Vital signs reviewed and stable, Patient moving all extremities, Patient moving all extremities X 4 and Patient able to stick tongue midline  Post vital signs: Reviewed and stable  Last Vitals:  Filed Vitals:   04/12/16 0610  BP: 153/74  Pulse: 71  Temp: 36.7 C  Resp: 20    Last Pain: There were no vitals filed for this visit.       Complications: No apparent anesthesia complications

## 2016-04-12 NOTE — Anesthesia Postprocedure Evaluation (Signed)
Anesthesia Post Note  Patient: Danielle Benitez  Procedure(s) Performed: Procedure(s) (LRB): PACEMAKER LEAD REMOVAL (N/A) TEMPORARY PACEMAKER INSERTION (N/A)  Patient location during evaluation: PACU Anesthesia Type: General Level of consciousness: awake Pain management: pain level controlled Vital Signs Assessment: post-procedure vital signs reviewed and stable Respiratory status: spontaneous breathing Cardiovascular status: stable Postop Assessment: no signs of nausea or vomiting Anesthetic complications: no    Last Vitals:  Filed Vitals:   04/12/16 1115 04/12/16 1139  BP: 150/78 141/70  Pulse:    Temp:  36.6 C  Resp:  16    Last Pain:  Filed Vitals:   04/12/16 1152  PainSc: 0-No pain                 Kaimana Lurz

## 2016-04-12 NOTE — H&P (View-Only) (Signed)
HPI Danielle Benitez returns today for followup having had her PPM lead come through her skin. She is a very pleasant elderly woman with a history of symptomatic bradycardia, status post permanent pacemaker insertion. Approximately a week ago, she was noted to have some itching over her PM site. She denies scratching the incision and has not had any fever or chills. She was seen by her primary MD and found to have the lead coming through the skin. She denies any recent infections.  Allergies  Allergen Reactions  . Beta Adrenergic Blockers Other (See Comments)    Couldn't speak or hear Metoprolol seems ok  . Jardiance [Empagliflozin] Other (See Comments)    Rash, kidney issues     Current Outpatient Prescriptions  Medication Sig Dispense Refill  . acetaminophen (TYLENOL) 325 MG tablet Take 1-2 tablets (325-650 mg total) by mouth 3 (three) times daily as needed.    Marland Kitchen albuterol (PROVENTIL HFA;VENTOLIN HFA) 108 (90 BASE) MCG/ACT inhaler Inhale 2 puffs into the lungs every 6 (six) hours as needed for wheezing or shortness of breath. 1 Inhaler 0  . allopurinol (ZYLOPRIM) 300 MG tablet Take 1 tablet (300 mg total) by mouth daily. 90 tablet 3  . amiodarone (PACERONE) 200 MG tablet Take 1 tablet (200 mg total) by mouth daily. 90 tablet 3  . apixaban (ELIQUIS) 2.5 MG TABS tablet Take 1 tablet (2.5 mg total) by mouth 2 (two) times daily. 60 tablet 11  . atorvastatin (LIPITOR) 20 MG tablet Take 1 tablet (20 mg total) by mouth daily at 6 PM. 30 tablet 6  . cetirizine (ZYRTEC) 10 MG tablet Take 10 mg by mouth daily.    . colchicine 0.6 MG tablet Take 0.5 tablets (0.3 mg total) by mouth daily as needed (for gout.  okay to fill with mitigare). 15 tablet 0  . doxycycline (VIBRA-TABS) 100 MG tablet Take 1 tablet (100 mg total) by mouth 2 (two) times daily. 20 tablet 0  . DULoxetine (CYMBALTA) 20 MG capsule Take 1 capsule (20 mg total) by mouth at bedtime. Take 1 tablet by mouth daily to help treat anxiety and nerves.  90 capsule 3  . fluticasone (FLONASE) 50 MCG/ACT nasal spray Place 2 sprays into both nostrils daily. 16 g 1  . furosemide (LASIX) 40 MG tablet Take 1 tablet (40 mg total) by mouth every other day. 15 tablet 6  . glimepiride (AMARYL) 1 MG tablet Take 1 tablet (1 mg total) by mouth daily with breakfast. 90 tablet 3  . glucose blood (ONE TOUCH ULTRA TEST) test strip Check blood sugar once daily and as directed. Dx E11.29 100 each 1  . isosorbide mononitrate (IMDUR) 30 MG 24 hr tablet Take 1 tablet (30 mg total) by mouth daily. 30 tablet 6  . metoprolol succinate (TOPROL-XL) 50 MG 24 hr tablet TAKE 1 TABLET BY MOUTH TWICE A DAY 60 tablet 6  . nystatin (MYCOSTATIN) powder Apply topically 3 (three) times daily. 60 g 1  . ondansetron (ZOFRAN ODT) 4 MG disintegrating tablet Take 1 tablet (4 mg total) by mouth every 8 (eight) hours as needed for nausea or vomiting. 20 tablet 0  . pantoprazole (PROTONIX) 40 MG tablet Take 1 tablet (40 mg total) by mouth daily. 30 tablet 3  . saxagliptin HCl (ONGLYZA) 2.5 MG TABS tablet Take 1 tablet (2.5 mg total) by mouth daily. 30 tablet 2  . senna-docusate (SENOKOT-S) 8.6-50 MG tablet Take 1 tablet by mouth daily as needed for mild constipation.    Marland Kitchen  Simethicone (GAS-X MAXIMUM STRENGTH PO) Take by mouth daily.    . traZODone (DESYREL) 50 MG tablet Take 0.5-1 tablets (25-50 mg total) by mouth at bedtime as needed for sleep. Don't take >50mg  in a day. 30 tablet 3  . triamcinolone (KENALOG) 0.025 % cream Apply 1 application topically 4 (four) times daily as needed (itching).     No current facility-administered medications for this visit.     Past Medical History  Diagnosis Date  . Gout   . Osteoarthritis   . S/P cardiac pacemaker procedure, PPM [employer] REVO placed 01/11/2012 01/11/2012    a. for symptomatic bradycardia. Followed by Dr. Ladona Ridgel.  . Personal history of fall   . Memory loss   . Anxiety state, unspecified   . Other and unspecified hyperlipidemia   .  Anemia, unspecified   . Depressive disorder, not elsewhere classified   . Lumbago   . Nonspecific abnormal results of liver function study   . Obesity, unspecified   . Internal hemorrhoids without mention of complication   . Abnormality of gait   . Pain in joint, shoulder region 11/22/2013    left   . Pruritus 11/22/2013  . Eczema 11/22/2013  . Full dentures   . HOH (hard of hearing)   . Wears glasses   . Female stress incontinence 05/06/2015  . DM (diabetes mellitus), type 2, uncontrolled, with renal complications (HCC) 01/08/2012  . CKD (chronic kidney disease), stage III   . Aortic stenosis     a. Mild by echo 08/2013.  . Essential hypertension   . Orthostatic hypotension   . Bifascicular block   . Chronic systolic CHF (congestive heart failure) (HCC)     a. Dx 07/2015 - EF 35-40%, unclear etiology  . Mild CAD     a. Cath 2013: 20-30% prox RCA.  Marland Kitchen Anticoagulated- Eliquis 07/31/2015  . Atrial fibrillation - developed 07/11/15 by PPM interrogation, recurrent after DCCV 10/14 07/11/2015     recurrent after DCCV 10/14  . Presence of permanent cardiac pacemaker   . Esophageal dysmotility 08/05/2015    ROS:   All systems reviewed and negative except as noted in the HPI.   Past Surgical History  Procedure Laterality Date  . Extremity cyst excision    . Cardiac catheterization  12/03/2003    Dr Clarene Duke  . Colonoscopy      ?????  . Orif wrist fracture Right 04/09/2014    Procedure: OPEN REDUCTION INTERNAL FIXATION (ORIF) RIGHT DISPLACED  DISTAL RADIUS  FRACTURE;  Surgeon: Dominica Severin, MD;  Location: [facility];  Service: Orthopedics;  Laterality: Right;  . Left heart catheterization with coronary angiogram N/A 01/10/2012    Procedure: LEFT HEART CATHETERIZATION WITH CORONARY ANGIOGRAM;  Surgeon: Chrystie Nose, MD;  Location: [facility] CATH LAB;  Service: Cardiovascular;  Laterality: N/A;  . Permanent pacemaker insertion Left 01/11/2012    Procedure: PERMANENT PACEMAKER  INSERTION;  Surgeon: Thurmon Fair, MD;  Location: [facility] CATH LAB;  Service: Cardiovascular;  Laterality: Left;  . Tee without cardioversion N/A 07/25/2015    Procedure: TRANSESOPHAGEAL ECHOCARDIOGRAM (TEE);  Surgeon: Pricilla Riffle, MD;  Location: [facility] ENDOSCOPY;  Service: Cardiovascular;  Laterality: N/A;  . Cardioversion N/A 07/25/2015    Procedure: CARDIOVERSION;  Surgeon: Pricilla Riffle, MD;  Location: [facility] ENDOSCOPY;  Service: Cardiovascular;  Laterality: N/A;  . Cataract extraction w/ intraocular lens  implant, bilateral Bilateral 03/2015-04/2015  . Cardioversion N/A 08/06/2015    Procedure: CARDIOVERSION;  Surgeon: Laurey Morale, MD;  Location: [facility] ENDOSCOPY;  Service: Cardiovascular;  Laterality: N/A;  . Esophagogastroduodenoscopy N/A 08/08/2015    Procedure: ESOPHAGOGASTRODUODENOSCOPY (EGD);  Surgeon: Iva Boop, MD;  Location: [facility] ENDOSCOPY;  Service: Endoscopy;  Laterality: N/A;     Family History  Problem Relation Age of Onset  . Breast cancer Mother   . Cancer Mother   . Diabetes Daughter   . Hypertension Daughter   . Cancer Brother      Social History   Social History  . Marital Status: Widowed    Spouse Name: N/A  . Number of Children: N/A  . Years of Education: N/A   Occupational History  . Not on file.   Social History Main Topics  . Smoking status: Never Smoker   . Smokeless tobacco: Never Used  . Alcohol Use: No  . Drug Use: No  . Sexual Activity: No   Other Topics Concern  . Not on file   Social History Narrative   Physically inactive. She says she hurts in her back and knees too much to do much walking.   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 Well-healed pacemaker incision. HEART:  Regular rate rhythm, no murmurs, no rubs, no clicks Abd:  soft, positive bowel sounds, no organomegally, no rebound, no guarding Ext:  2 plus pulses, no edema, no cyanosis, no clubbing Skin:  No rashes no nodules Neuro:  CN II through XII intact, motor grossly intact   DEVICE  She is PM dependent  Assess/Plan: 1. PM pocket infection - she is not systemically ill. She will undergo PM system extraction next week. I have discussed the indications, risks, benefits, goals, and expectations of the procedure with the patient and she wishes to proceed.  2. Sinus node dysfunction - she had no escape on device interogation yesterday. I would anticipate the placement of a temporary permanent PM and device re-implantation several days after initial extraction. 3. CAD - she denies anginal symptoms. 4. Chronic renal insufficiency - will hope to avoid hypotension during her procedure and any nephrotoxic drugs.   Danielle Benitez.D.

## 2016-04-12 NOTE — Anesthesia Procedure Notes (Signed)
Procedure Name: Intubation Date/Time: 04/12/2016 7:49 AM Performed by: Izora Gala Pre-anesthesia Checklist: Patient identified, Emergency Drugs available, Suction available and Patient being monitored Patient Re-evaluated:Patient Re-evaluated prior to inductionOxygen Delivery Method: Circle system utilized Preoxygenation: Pre-oxygenation with 100% oxygen Intubation Type: IV induction Ventilation: Mask ventilation without difficulty Laryngoscope Size: Miller and 3 Grade View: Grade I Tube type: Oral Tube size: 7.0 mm Number of attempts: 1 Airway Equipment and Method: Stylet and LTA kit utilized Placement Confirmation: ETT inserted through vocal cords under direct vision,  positive ETCO2 and breath sounds checked- equal and bilateral Secured at: 22 cm Tube secured with: Tape Dental Injury: Teeth and Oropharynx as per pre-operative assessment

## 2016-04-12 NOTE — Interval H&P Note (Signed)
History and Physical Interval Note:  04/12/2016 7:58 AM  Danielle Benitez  has presented today for surgery, with the diagnosis of Pacemaker pocket infection   The various methods of treatment have been discussed with the patient and family. After consideration of risks, benefits and other options for treatment, the patient has consented to  Procedure(s): PACEMAKER LEAD REMOVAL (N/A) TEMPORARY PACEMAKER INSERTION as a surgical intervention .  The patient's history has been reviewed, patient examined, no change in status, stable for surgery.  I have reviewed the patient's chart and labs.  Questions were answered to the patient's satisfaction.     Lewayne Bunting

## 2016-04-12 NOTE — Progress Notes (Signed)
Pt arrived to floor from postop.  Telemetry applied and CCMD notified.  Pt and daughter oriented to room including call light and telephone.  Pt plan discussed with Pt and family member.  All questions answered.  Will cont to monitor.

## 2016-04-13 ENCOUNTER — Ambulatory Visit (HOSPITAL_COMMUNITY): Payer: Medicare Other

## 2016-04-13 DIAGNOSIS — N183 Chronic kidney disease, stage 3 (moderate): Secondary | ICD-10-CM | POA: Diagnosis not present

## 2016-04-13 DIAGNOSIS — E1122 Type 2 diabetes mellitus with diabetic chronic kidney disease: Secondary | ICD-10-CM | POA: Diagnosis not present

## 2016-04-13 DIAGNOSIS — T827XXA Infection and inflammatory reaction due to other cardiac and vascular devices, implants and grafts, initial encounter: Secondary | ICD-10-CM | POA: Diagnosis not present

## 2016-04-13 DIAGNOSIS — M1990 Unspecified osteoarthritis, unspecified site: Secondary | ICD-10-CM | POA: Diagnosis not present

## 2016-04-13 DIAGNOSIS — I495 Sick sinus syndrome: Secondary | ICD-10-CM | POA: Diagnosis not present

## 2016-04-13 DIAGNOSIS — Z7901 Long term (current) use of anticoagulants: Secondary | ICD-10-CM | POA: Diagnosis not present

## 2016-04-13 DIAGNOSIS — Z950 Presence of cardiac pacemaker: Secondary | ICD-10-CM | POA: Diagnosis not present

## 2016-04-13 DIAGNOSIS — I480 Paroxysmal atrial fibrillation: Secondary | ICD-10-CM | POA: Diagnosis not present

## 2016-04-13 DIAGNOSIS — I5032 Chronic diastolic (congestive) heart failure: Secondary | ICD-10-CM

## 2016-04-13 DIAGNOSIS — I2510 Atherosclerotic heart disease of native coronary artery without angina pectoris: Secondary | ICD-10-CM | POA: Diagnosis not present

## 2016-04-13 DIAGNOSIS — I5042 Chronic combined systolic (congestive) and diastolic (congestive) heart failure: Secondary | ICD-10-CM | POA: Diagnosis not present

## 2016-04-13 DIAGNOSIS — I130 Hypertensive heart and chronic kidney disease with heart failure and stage 1 through stage 4 chronic kidney disease, or unspecified chronic kidney disease: Secondary | ICD-10-CM | POA: Diagnosis not present

## 2016-04-13 LAB — BASIC METABOLIC PANEL
Anion gap: 7 (ref 5–15)
BUN: 28 mg/dL — AB (ref 6–20)
CO2: 26 mmol/L (ref 22–32)
CREATININE: 1.94 mg/dL — AB (ref 0.44–1.00)
Calcium: 8.7 mg/dL — ABNORMAL LOW (ref 8.9–10.3)
Chloride: 102 mmol/L (ref 101–111)
GFR calc Af Amer: 27 mL/min — ABNORMAL LOW (ref >60)
GFR calc non Af Amer: 23 mL/min — ABNORMAL LOW (ref >60)
GLUCOSE: 283 mg/dL — AB (ref 65–99)
POTASSIUM: 4.5 mmol/L (ref 3.5–5.1)
Sodium: 135 mmol/L (ref 135–145)

## 2016-04-13 LAB — GLUCOSE, CAPILLARY
GLUCOSE-CAPILLARY: 279 mg/dL — AB (ref 65–99)
Glucose-Capillary: 190 mg/dL — ABNORMAL HIGH (ref 65–99)
Glucose-Capillary: 197 mg/dL — ABNORMAL HIGH (ref 65–99)

## 2016-04-13 LAB — HEMOGLOBIN A1C
HEMOGLOBIN A1C: 7.9 % — AB (ref 4.8–5.6)
MEAN PLASMA GLUCOSE: 180 mg/dL

## 2016-04-13 MED ORDER — FUROSEMIDE 10 MG/ML IJ SOLN
40.0000 mg | Freq: Every day | INTRAMUSCULAR | Status: DC
  Administered 2016-04-13 – 2016-04-16 (×4): 40 mg via INTRAVENOUS
  Filled 2016-04-13 (×4): qty 4

## 2016-04-13 NOTE — Progress Notes (Signed)
Patient ID: [name], female   DOB: [birth year], 82 y.o.   MRN: [id number]    Patient Name: Danielle Benitez Date of Encounter: 04/13/2016     Active Problems:   Pacemaker infection (HCC)    SUBJECTIVE  No chest pain. C/o a little sob. Sore.   CURRENT MEDS . allopurinol  300 mg Oral Daily  . amiodarone  200 mg Oral Daily  . apixaban  2.5 mg Oral BID  . atorvastatin  20 mg Oral q1800  . Chlorhexidine Gluconate Cloth  6 each Topical Daily  . DULoxetine  20 mg Oral QHS  . fluticasone  2 spray Each Nare Daily  . furosemide  40 mg Oral QODAY  . insulin aspart  0-15 Units Subcutaneous TID WC  . insulin aspart  0-5 Units Subcutaneous QHS  . isosorbide mononitrate  30 mg Oral Daily  . loratadine  10 mg Oral Daily  . metoprolol succinate  50 mg Oral BID  . mupirocin ointment  1 application Nasal BID  . pantoprazole  40 mg Oral Daily    OBJECTIVE  Filed Vitals:   04/12/16 1139 04/12/16 1152 04/12/16 2101 04/13/16 0500  BP: 141/70  143/71 110/62  Pulse:   71 70  Temp: 97.8 F (36.6 C)  97.7 F (36.5 C) 98.7 F (37.1 C)  TempSrc: Oral  Oral Oral  Resp: Height:      Weight:      SpO2: 100% 93% 100% 95%    Intake/Output Summary (Last 24 hours) at 04/13/16 0924 Last data filed at 04/13/16 0502  Gross per 24 hour  Intake   1610 ml  Output    650 ml  Net    960 ml   Filed Weights   04/12/16 0610  Weight: 174 lb (78.926 kg)    PHYSICAL EXAM  General: Pleasant, NAD. Neuro: Alert and oriented X 3. Moves all extremities spontaneously. Psych: Normal affect. HEENT:  Normal  Neck: Supple without bruits or JVD. Lungs:  Resp regular and unlabored, CTA. Wound clean and dry.  Heart: RRR no s3, s4, or murmurs. Abdomen: Soft, non-tender, non-distended, BS + x 4.  Extremities: No clubbing, cyanosis or edema. DP/PT/Radials 2+ and equal bilaterally.  Accessory Clinical Findings  CBC  Recent Labs  04/12/16 0634  WBC 6.7  HGB 11.0*  HCT 36.6  MCV 95.1  PLT 142*    Basic Metabolic Panel  Recent Labs  04/12/16 0634  NA 138  K 4.4  CL 105  CO2 27  GLUCOSE 154*  BUN 26*  CREATININE 1.56*  CALCIUM 9.3   Liver Function Tests No results for input(s): AST, ALT, ALKPHOS, BILITOT, PROT, ALBUMIN in the last 72 hours. No results for input(s): LIPASE, AMYLASE in the last 72 hours. Cardiac Enzymes No results for input(s): CKTOTAL, CKMB, CKMBINDEX, TROPONINI in the last 72 hours. BNP Invalid input(s): POCBNP D-Dimer No results for input(s): DDIMER in the last 72 hours. Hemoglobin A1C  Recent Labs  04/12/16 0634  HGBA1C 7.9*   Fasting Lipid Panel No results for input(s): CHOL, HDL, LDLCALC, TRIG, CHOLHDL, LDLDIRECT in the last 72 hours. Thyroid Function Tests No results for input(s): TSH, T4TOTAL, T3FREE, THYROIDAB in the last 72 hours.  Invalid input(s): FREET3  TELE NSR with asynchronous ventricular pacing  Radiology/Studies  Dg Chest 2 View  04/13/2016  CLINICAL DATA:  82 year old female with a history of possible pacemaker infection EXAM: CHEST  2 VIEW COMPARISON:  11/13/2015 FINDINGS: Cardiomediastinal silhouette  unchanged in size and contour with cardiomegaly. Calcifications of the aortic arch again partially visualized. Low lung volumes with linear opacities at the bilateral lung bases without pleural effusion or pneumothorax. No confluent airspace disease. In the interval, the cardiac pacing generator on the left chest wall has moved towards the midline, and judging the lateral view, 2 prior leads have been extracted and replaced with now a single pacing lead. Degenerative changes of the bilateral shoulders. IMPRESSION: Low lung volumes with likely basilar atelectasis and no evidence of lobar pneumonia or pleural effusion. Background of chronic scarring. There appears to be interval extraction of prior to cardiac pacing leads, replaced by a new single cardiac pacing lead, as well as medialized pacing generator on the chest wall. Signed,  Yvone Neu. Loreta Ave, DO [facility] Electronically Signed   By: Gilmer Mor D.O.   On: 04/13/2016 08:43    ASSESSMENT AND PLAN  1. Pacemaker system infection with protruding PM lead - she is s/p extraction with insertion of a temporary permanent PM doing well.  2. Sinus node dysfunction - she is s/p insertion of a temp-perm PM and is stable with ventricular pacing 3. Chronic diastolic heart failure - she appears a bit more dyspneic. I suspect she does not like dysynchronous ventricular pacing. Will give some lasix.  Gregg Taylor,M.D.  04/13/2016 9:24 AM

## 2016-04-13 NOTE — Progress Notes (Signed)
Call placed to coverage.  Pt with pain to back of left knee.  Order received for U/S.  Will cont to monitor.

## 2016-04-14 ENCOUNTER — Encounter: Payer: Self-pay | Admitting: Surgery

## 2016-04-14 ENCOUNTER — Ambulatory Visit (HOSPITAL_BASED_OUTPATIENT_CLINIC_OR_DEPARTMENT_OTHER): Payer: Medicare Other

## 2016-04-14 DIAGNOSIS — I480 Paroxysmal atrial fibrillation: Secondary | ICD-10-CM | POA: Diagnosis not present

## 2016-04-14 DIAGNOSIS — I130 Hypertensive heart and chronic kidney disease with heart failure and stage 1 through stage 4 chronic kidney disease, or unspecified chronic kidney disease: Secondary | ICD-10-CM | POA: Diagnosis not present

## 2016-04-14 DIAGNOSIS — E1122 Type 2 diabetes mellitus with diabetic chronic kidney disease: Secondary | ICD-10-CM | POA: Diagnosis not present

## 2016-04-14 DIAGNOSIS — I495 Sick sinus syndrome: Secondary | ICD-10-CM | POA: Diagnosis not present

## 2016-04-14 DIAGNOSIS — T827XXA Infection and inflammatory reaction due to other cardiac and vascular devices, implants and grafts, initial encounter: Secondary | ICD-10-CM | POA: Diagnosis not present

## 2016-04-14 DIAGNOSIS — M25562 Pain in left knee: Secondary | ICD-10-CM | POA: Diagnosis not present

## 2016-04-14 DIAGNOSIS — I5032 Chronic diastolic (congestive) heart failure: Secondary | ICD-10-CM | POA: Diagnosis not present

## 2016-04-14 DIAGNOSIS — I5042 Chronic combined systolic (congestive) and diastolic (congestive) heart failure: Secondary | ICD-10-CM | POA: Diagnosis not present

## 2016-04-14 DIAGNOSIS — Z7901 Long term (current) use of anticoagulants: Secondary | ICD-10-CM | POA: Diagnosis not present

## 2016-04-14 DIAGNOSIS — M1990 Unspecified osteoarthritis, unspecified site: Secondary | ICD-10-CM | POA: Diagnosis not present

## 2016-04-14 DIAGNOSIS — I2510 Atherosclerotic heart disease of native coronary artery without angina pectoris: Secondary | ICD-10-CM | POA: Diagnosis not present

## 2016-04-14 DIAGNOSIS — N183 Chronic kidney disease, stage 3 (moderate): Secondary | ICD-10-CM | POA: Diagnosis not present

## 2016-04-14 LAB — GLUCOSE, CAPILLARY
GLUCOSE-CAPILLARY: 268 mg/dL — AB (ref 65–99)
Glucose-Capillary: 178 mg/dL — ABNORMAL HIGH (ref 65–99)
Glucose-Capillary: 170 mg/dL — ABNORMAL HIGH (ref 65–99)
Glucose-Capillary: 239 mg/dL — ABNORMAL HIGH (ref 65–99)

## 2016-04-14 MED ORDER — SODIUM CHLORIDE 0.9 % IV SOLN
INTRAVENOUS | Status: DC
Start: 2016-04-15 — End: 2016-04-15
  Administered 2016-04-15: 06:00:00 via INTRAVENOUS

## 2016-04-14 MED ORDER — CEFAZOLIN SODIUM-DEXTROSE 2-4 GM/100ML-% IV SOLN
2.0000 g | INTRAVENOUS | Status: DC
  Filled 2016-04-14: qty 100

## 2016-04-14 MED ORDER — SODIUM CHLORIDE 0.9 % IR SOLN
80.0000 mg | Status: DC
  Filled 2016-04-14: qty 2

## 2016-04-14 NOTE — Progress Notes (Signed)
Patient lying in bed, no needs at this time. Call light within reach. 

## 2016-04-14 NOTE — Progress Notes (Signed)
Patient ID: Danielle Benitez, female   DOB: [birth year], 82 y.o.   MRN: [id number]  I provided surgical backup for one hour and fifty minutes for extraction of a dual-chamber pacing system by Dr. Ladona Ridgel on 04/12/2016.

## 2016-04-14 NOTE — Progress Notes (Signed)
Patient ID: Danielle Benitez, female   DOB: 06/14/1932, 82 y.o.   MRN: [id number]    Patient Name: Danielle Benitez Date of Encounter: 04/14/2016     Active Problems:   Pacemaker infection (HCC)    SUBJECTIVE  No chest pain or sob. Incision looks good.   CURRENT MEDS . allopurinol  300 mg Oral Daily  . amiodarone  200 mg Oral Daily  . apixaban  2.5 mg Oral BID  . atorvastatin  20 mg Oral q1800  . Chlorhexidine Gluconate Cloth  6 each Topical Daily  . DULoxetine  20 mg Oral QHS  . fluticasone  2 spray Each Nare Daily  . furosemide  40 mg Intravenous Daily  . insulin aspart  0-15 Units Subcutaneous TID WC  . insulin aspart  0-5 Units Subcutaneous QHS  . isosorbide mononitrate  30 mg Oral Daily  . loratadine  10 mg Oral Daily  . metoprolol succinate  50 mg Oral BID  . mupirocin ointment  1 application Nasal BID  . pantoprazole  40 mg Oral Daily    OBJECTIVE  Filed Vitals:   04/13/16 0500 04/13/16 1616 04/13/16 2115 04/14/16 0558  BP: 110/62 95/49 107/55 108/62  Pulse: 70 59 78 69  Temp: 98.7 F (37.1 C) 99.1 F (37.3 C) 98.4 F (36.9 C) 98.4 F (36.9 C)  TempSrc: Oral Oral Oral Oral  Resp: 17 18 18 15  Height:      Weight:      SpO2: 95% 96% 96% 96%    Intake/Output Summary (Last 24 hours) at 04/14/16 0929 Last data filed at 04/13/16 1617  Gross per 24 hour  Intake    240 ml  Output      0 ml  Net    240 ml   Filed Weights   04/12/16 0610  Weight: 174 lb (78.926 kg)    PHYSICAL EXAM  General: Pleasant, NAD. Neuro: Alert and oriented X 3. Moves all extremities spontaneously. Psych: Normal affect. HEENT:  Normal  Neck: Supple without bruits or JVD. Lungs:  Resp regular and unlabored, CTA. Heart: RRR no s3, s4, or murmurs. Abdomen: Soft, non-tender, non-distended, BS + x 4.  Extremities: No clubbing, cyanosis or edema. DP/PT/Radials 2+ and equal bilaterally.  Accessory Clinical Findings  CBC  Recent Labs  04/12/16 0634  WBC 6.7  HGB 11.0*  HCT 36.6    MCV 95.1  PLT 142*   Basic Metabolic Panel  Recent Labs  04/12/16 0634 04/13/16 1024  NA 138 135  K 4.4 4.5  CL 105 102  CO2 27 26  GLUCOSE 154* 283*  BUN 26* 28*  CREATININE 1.56* 1.94*  CALCIUM 9.3 8.7*   Liver Function Tests No results for input(s): AST, ALT, ALKPHOS, BILITOT, PROT, ALBUMIN in the last 72 hours. No results for input(s): LIPASE, AMYLASE in the last 72 hours. Cardiac Enzymes No results for input(s): CKTOTAL, CKMB, CKMBINDEX, TROPONINI in the last 72 hours. BNP Invalid input(s): POCBNP D-Dimer No results for input(s): DDIMER in the last 72 hours. Hemoglobin A1C  Recent Labs  04/12/16 0634  HGBA1C 7.9*   Fasting Lipid Panel No results for input(s): CHOL, HDL, LDLCALC, TRIG, CHOLHDL, LDLDIRECT in the last 72 hours. Thyroid Function Tests No results for input(s): TSH, T4TOTAL, T3FREE, THYROIDAB in the last 72 hours.  Invalid input(s): FREET3  TELE  Ventricular pacing  Radiology/Studies  Dg Chest 2 View  04/13/2016  CLINICAL DATA:  82-year-old female with a history of possible pacemaker infection EXAM:   CHEST  2 VIEW COMPARISON:  11/13/2015 FINDINGS: Cardiomediastinal silhouette unchanged in size and contour with cardiomegaly. Calcifications of the aortic arch again partially visualized. Low lung volumes with linear opacities at the bilateral lung bases without pleural effusion or pneumothorax. No confluent airspace disease. In the interval, the cardiac pacing generator on the left chest wall has moved towards the midline, and judging the lateral view, 2 prior leads have been extracted and replaced with now a single pacing lead. Degenerative changes of the bilateral shoulders. IMPRESSION: Low lung volumes with likely basilar atelectasis and no evidence of lobar pneumonia or pleural effusion. Background of chronic scarring. There appears to be interval extraction of prior to cardiac pacing leads, replaced by a new single cardiac pacing lead, as well as  medialized pacing generator on the chest wall. Signed, Yvone Neu. Loreta Ave, DO [facility] Electronically Signed   By: Gilmer Mor D.O.   On: 04/13/2016 08:43    ASSESSMENT AND PLAN  1. PM pocket infection - she is s/p extraction and insertion of a temp-perm PM. She will undergo insertion of a new PPM tomorrow. No evidence of any residual infection.  2. Sinus node dysfunction - she is stable with a temp PM in place.  3. Chronic diastolic heart failure - she is better after lasix. She likes atrial pacing.  Nijah Tejera,M.D.  04/14/2016 9:29 AM

## 2016-04-14 NOTE — Progress Notes (Signed)
Inpatient Diabetes Program Recommendations  AACE/ADA: New Consensus Statement on Inpatient Glycemic Control (2015)  Target Ranges:  Prepandial:   less than 140 mg/dL      Peak postprandial:   less than 180 mg/dL (1-2 hours)      Critically ill patients:  140 - 180 mg/dL   Lab Results  Component Value Date   GLUCAP 178* 04/14/2016   HGBA1C 7.9* 04/12/2016    Review of Glycemic Control:  Results for ITALI, RASCHE (MRN [id number]) as of 04/14/2016 08:52  Ref. Range 04/13/2016 06:34 04/13/2016 16:24 04/13/2016 21:28 04/14/2016 05:56  Glucose-Capillary Latest Ref Range: 65-99 mg/dL 884 (H) 166 (H) 063 (H) 178 (H)   Diabetes history: Type 2 diabetes Outpatient Diabetes medications: Amaryl 1 mg daily, Onglyza 2.5 mg daily Current orders for Inpatient glycemic control:  Novolog moderate tid with meals and HS  Inpatient Diabetes Program Recommendations:    While in the hospital, may consider adding 15 units Lantus daily.  Thanks, Beryl Meager, RN, BC-ADM Inpatient Diabetes Coordinator Pager [phone]

## 2016-04-14 NOTE — Research (Signed)
Apixaban Validation Study (ClinicalTrials.gov Identifier: [id number]) RESEARCH SUBJECT. Purpose: Obtain fresh samples that will be used to assess the performances of STA-Apixaban Calibrator and STA-Apixaban Control in combination with the STA-Liquid Anti-Xa to determine the quantity of apixaban in plasma samples by measurement of its direct anti-Xa activity. Apixaban Validation Study is sponsored by [employer].The fresh samples will collected by completing a one time blood draw on approved patients.   Inclusion Criteria: Must meet AT LEAST one of the following:  weight </= 60kg or >/= 75 years or Hct <39% for female or < 36% for female or renal impairment or co-medication with ASA/NSAIDs, or co-medication with anti-platelet agents.  Apixaban Validation Study Informed Consent   Subject Name: Danielle Benitez  This patient, Danielle Benitez, has been consented to the above clinical trial according to FDA regulations, GCP guidelines and [facility]'s SOPs. The informed consent form and study design have been explained to this patient by this study coordinator at 10:15 AM on 04/14/2016. The patient demonstrated comprehension of this clinical trial and study requirements/expectations. No study procedures have been initiated before consenting of this patient. The patient was given sufficient time for reading the consent form. All risks, benefits and options have been thoroughly discussed and all questions were answered per the patient's satisfaction. This patient was not coerced in any way to participate in this clinical trial. This patient has voluntarily signed consent version 2.0 at 11:20 AM on 04/14/2016. A copy of the signed consent form was given to the patient and a copy was placed in the subject's medical record. Subject was thanked for their participation in research and contribution to science.  Toula Moos, RN Clinical Research Nurse  [facility] Office: [phone]  I have reviewed the  criteria for which patient was included in the clinical trial and the documentation by the consenting nurse and agree with assessment and consenting.   Hulen Luster, PharmD

## 2016-04-14 NOTE — Progress Notes (Signed)
VASCULAR LAB PRELIMINARY  PRELIMINARY  PRELIMINARY  PRELIMINARY   Preliminary report:  Left lower extremity venous duplex has been completed.  Left:  No evidence of DVT, superficial thrombosis, or Baker's cyst.   Rondall Allegra, RN @6 :35 pm.  Jenetta Loges, RVT, RDMS 04/14/2016, 6:34 PM

## 2016-04-15 ENCOUNTER — Encounter (HOSPITAL_COMMUNITY)
Admission: RE | Disposition: A | Discharge status: Home / Self Care | Payer: Self-pay | Source: Ambulatory Visit | Attending: Internal Medicine

## 2016-04-15 ENCOUNTER — Encounter (HOSPITAL_COMMUNITY): Payer: Self-pay | Admitting: Internal Medicine

## 2016-04-15 ENCOUNTER — Encounter (HOSPITAL_COMMUNITY): Payer: Medicare Other

## 2016-04-15 DIAGNOSIS — I5042 Chronic combined systolic (congestive) and diastolic (congestive) heart failure: Secondary | ICD-10-CM | POA: Diagnosis not present

## 2016-04-15 DIAGNOSIS — I495 Sick sinus syndrome: Secondary | ICD-10-CM | POA: Diagnosis not present

## 2016-04-15 DIAGNOSIS — M1990 Unspecified osteoarthritis, unspecified site: Secondary | ICD-10-CM | POA: Diagnosis not present

## 2016-04-15 DIAGNOSIS — I130 Hypertensive heart and chronic kidney disease with heart failure and stage 1 through stage 4 chronic kidney disease, or unspecified chronic kidney disease: Secondary | ICD-10-CM | POA: Diagnosis not present

## 2016-04-15 DIAGNOSIS — I2510 Atherosclerotic heart disease of native coronary artery without angina pectoris: Secondary | ICD-10-CM | POA: Diagnosis not present

## 2016-04-15 DIAGNOSIS — E1122 Type 2 diabetes mellitus with diabetic chronic kidney disease: Secondary | ICD-10-CM | POA: Diagnosis not present

## 2016-04-15 DIAGNOSIS — Z7901 Long term (current) use of anticoagulants: Secondary | ICD-10-CM | POA: Diagnosis not present

## 2016-04-15 DIAGNOSIS — N183 Chronic kidney disease, stage 3 (moderate): Secondary | ICD-10-CM | POA: Diagnosis not present

## 2016-04-15 DIAGNOSIS — I480 Paroxysmal atrial fibrillation: Secondary | ICD-10-CM | POA: Diagnosis not present

## 2016-04-15 DIAGNOSIS — T827XXA Infection and inflammatory reaction due to other cardiac and vascular devices, implants and grafts, initial encounter: Secondary | ICD-10-CM | POA: Diagnosis not present

## 2016-04-15 HISTORY — PX: EP IMPLANTABLE DEVICE: SHX172B

## 2016-04-15 HISTORY — PX: PACEMAKER LEAD REMOVAL: SHX5064

## 2016-04-15 LAB — TYPE AND SCREEN
ABO/RH(D): A NEG
Antibody Screen: NEGATIVE
UNIT DIVISION: 0
Unit division: 0
Unit division: 0
Unit division: 0

## 2016-04-15 LAB — GLUCOSE, CAPILLARY
GLUCOSE-CAPILLARY: 220 mg/dL — AB (ref 65–99)
GLUCOSE-CAPILLARY: 216 mg/dL — AB (ref 65–99)
Glucose-Capillary: 197 mg/dL — ABNORMAL HIGH (ref 65–99)
Glucose-Capillary: 293 mg/dL — ABNORMAL HIGH (ref 65–99)

## 2016-04-15 LAB — CREATININE, SERUM
CREATININE: 1.87 mg/dL — AB (ref 0.44–1.00)
GFR calc Af Amer: 28 mL/min — ABNORMAL LOW (ref >60)
GFR calc non Af Amer: 24 mL/min — ABNORMAL LOW (ref >60)

## 2016-04-15 LAB — CBC
HCT: 34.8 % — ABNORMAL LOW (ref 36.0–46.0)
HEMOGLOBIN: 10.5 g/dL — AB (ref 12.0–15.0)
MCH: 28.2 pg (ref 26.0–34.0)
MCHC: 30.2 g/dL (ref 30.0–36.0)
MCV: 93.3 fL (ref 78.0–100.0)
Platelets: 127 10*3/uL — ABNORMAL LOW (ref 150–400)
RBC: 3.73 MIL/uL — ABNORMAL LOW (ref 3.87–5.11)
RDW: 15.5 % (ref 11.5–15.5)
WBC: 7.1 10*3/uL (ref 4.0–10.5)

## 2016-04-15 SURGERY — PACEMAKER IMPLANT
Anesthesia: LOCAL

## 2016-04-15 MED ORDER — MIDAZOLAM HCL 5 MG/5ML IJ SOLN
INTRAMUSCULAR | Status: AC
  Filled 2016-04-15: qty 5

## 2016-04-15 MED ORDER — HEPARIN (PORCINE) IN NACL 2-0.9 UNIT/ML-% IJ SOLN
INTRAMUSCULAR | Status: AC
  Filled 2016-04-15: qty 500

## 2016-04-15 MED ORDER — SODIUM CHLORIDE 0.9 % IV SOLN
INTRAVENOUS | Status: DC | PRN
  Administered 2016-04-15: 50 mL/h via INTRAVENOUS

## 2016-04-15 MED ORDER — LIDOCAINE HCL (PF) 1 % IJ SOLN
INTRAMUSCULAR | Status: DC | PRN
Start: 2016-04-15 — End: 2016-04-15
  Administered 2016-04-15: 25 mL via SUBCUTANEOUS

## 2016-04-15 MED ORDER — CEFAZOLIN IN D5W 1 GM/50ML IV SOLN
1.0000 g | Freq: Four times a day (QID) | INTRAVENOUS | Status: AC
  Administered 2016-04-15 – 2016-04-16 (×3): 1 g via INTRAVENOUS
  Filled 2016-04-15 (×3): qty 50

## 2016-04-15 MED ORDER — FENTANYL CITRATE (PF) 100 MCG/2ML IJ SOLN
INTRAMUSCULAR | Status: DC | PRN
  Administered 2016-04-15 (×3): 12.5 ug via INTRAVENOUS

## 2016-04-15 MED ORDER — CEFAZOLIN SODIUM-DEXTROSE 2-4 GM/100ML-% IV SOLN
INTRAVENOUS | Status: AC
  Filled 2016-04-15: qty 100

## 2016-04-15 MED ORDER — FENTANYL CITRATE (PF) 100 MCG/2ML IJ SOLN
INTRAMUSCULAR | Status: AC
  Filled 2016-04-15: qty 2

## 2016-04-15 MED ORDER — GENTAMICIN SULFATE 40 MG/ML IJ SOLN
INTRAMUSCULAR | Status: AC
  Filled 2016-04-15: qty 2

## 2016-04-15 MED ORDER — LIDOCAINE HCL (PF) 1 % IJ SOLN
INTRAMUSCULAR | Status: AC
  Filled 2016-04-15: qty 30

## 2016-04-15 MED ORDER — HEPARIN (PORCINE) IN NACL 2-0.9 UNIT/ML-% IJ SOLN
INTRAMUSCULAR | Status: DC | PRN
  Administered 2016-04-15: 500 mL

## 2016-04-15 MED ORDER — HEPARIN SODIUM (PORCINE) 5000 UNIT/ML IJ SOLN: 5000.0000 [IU] | Freq: Three times a day (TID) | INTRAMUSCULAR | Status: DC

## 2016-04-15 MED ORDER — CEFAZOLIN SODIUM-DEXTROSE 2-3 GM-% IV SOLR
INTRAVENOUS | Status: DC | PRN
  Administered 2016-04-15: 2 g via INTRAVENOUS

## 2016-04-15 MED ORDER — MIDAZOLAM HCL 5 MG/5ML IJ SOLN
INTRAMUSCULAR | Status: DC | PRN
  Administered 2016-04-15 (×3): 1 mg via INTRAVENOUS

## 2016-04-15 MED ORDER — ACETAMINOPHEN 325 MG PO TABS
325.0000 mg | ORAL_TABLET | ORAL | Status: DC | PRN
  Administered 2016-04-15 – 2016-04-16 (×2): 650 mg via ORAL
  Filled 2016-04-15: qty 2

## 2016-04-15 MED ORDER — ONDANSETRON HCL 4 MG/2ML IJ SOLN: 4.0000 mg | Freq: Four times a day (QID) | INTRAMUSCULAR | Status: DC | PRN

## 2016-04-15 SURGICAL SUPPLY — 7 items
CABLE SURGICAL S-101-97-12 (CABLE) ×1 IMPLANT
LEAD CAPSURE NOVUS 45CM (Lead) ×1 IMPLANT
LEAD CAPSURE NOVUS 5076-52CM (Lead) ×1 IMPLANT
PAD DEFIB LIFELINK (PAD) ×1 IMPLANT
PPM ADVISA MRI DR A2DR01 (Pacemaker) ×1 IMPLANT
SHEATH CLASSIC 7F (SHEATH) ×2 IMPLANT
TRAY PACEMAKER INSERTION (PACKS) ×1 IMPLANT

## 2016-04-15 NOTE — Progress Notes (Signed)
Pt back in room from cath lab, permanent pacemaker placement.  Site assessed with cath lab RN as well as site where temp pacemaker was removed.  Pt is without complaints at this time, resting comfortably.  Reminded of RUE restriction.

## 2016-04-15 NOTE — Progress Notes (Signed)
Patient lying in bed, no needs at this time. Reinforced not to lay on or use her right arm. Call light within reach.

## 2016-04-15 NOTE — H&P (View-Only) (Signed)
Patient ID: Danielle Benitez, female   DOB: [birth year], 82 y.o.   MRN: [id number]    Patient Name: Danielle Benitez Date of Encounter: 04/14/2016     Active Problems:   Pacemaker infection (HCC)    SUBJECTIVE  No chest pain or sob. Incision looks good.   CURRENT MEDS . allopurinol  300 mg Oral Daily  . amiodarone  200 mg Oral Daily  . apixaban  2.5 mg Oral BID  . atorvastatin  20 mg Oral q1800  . Chlorhexidine Gluconate Cloth  6 each Topical Daily  . DULoxetine  20 mg Oral QHS  . fluticasone  2 spray Each Nare Daily  . furosemide  40 mg Intravenous Daily  . insulin aspart  0-15 Units Subcutaneous TID WC  . insulin aspart  0-5 Units Subcutaneous QHS  . isosorbide mononitrate  30 mg Oral Daily  . loratadine  10 mg Oral Daily  . metoprolol succinate  50 mg Oral BID  . mupirocin ointment  1 application Nasal BID  . pantoprazole  40 mg Oral Daily    OBJECTIVE  Filed Vitals:   04/13/16 0500 04/13/16 1616 04/13/16 2115 04/14/16 0558  BP: 110/62 95/49 107/55 108/62  Pulse: 70 59 78 69  Temp: 98.7 F (37.1 C) 99.1 F (37.3 C) 98.4 F (36.9 C) 98.4 F (36.9 C)  TempSrc: Oral Oral Oral Oral  Resp: Height:      Weight:      SpO2: 95% 96% 96% 96%    Intake/Output Summary (Last 24 hours) at 04/14/16 0929 Last data filed at 04/13/16 1617  Gross per 24 hour  Intake    240 ml  Output      0 ml  Net    240 ml   Filed Weights   04/12/16 0610  Weight: 174 lb (78.926 kg)    PHYSICAL EXAM  General: Pleasant, NAD. Neuro: Alert and oriented X 3. Moves all extremities spontaneously. Psych: Normal affect. HEENT:  Normal  Neck: Supple without bruits or JVD. Lungs:  Resp regular and unlabored, CTA. Heart: RRR no s3, s4, or murmurs. Abdomen: Soft, non-tender, non-distended, BS + x 4.  Extremities: No clubbing, cyanosis or edema. DP/PT/Radials 2+ and equal bilaterally.  Accessory Clinical Findings  CBC  Recent Labs  04/12/16 0634  WBC 6.7  HGB 11.0*  HCT 36.6    MCV 95.1  PLT 142*   Basic Metabolic Panel  Recent Labs  04/12/16 0634 04/13/16 1024  NA 138 135  K 4.4 4.5  CL 105 102  CO2 27 26  GLUCOSE 154* 283*  BUN 26* 28*  CREATININE 1.56* 1.94*  CALCIUM 9.3 8.7*   Liver Function Tests No results for input(s): AST, ALT, ALKPHOS, BILITOT, PROT, ALBUMIN in the last 72 hours. No results for input(s): LIPASE, AMYLASE in the last 72 hours. Cardiac Enzymes No results for input(s): CKTOTAL, CKMB, CKMBINDEX, TROPONINI in the last 72 hours. BNP Invalid input(s): POCBNP D-Dimer No results for input(s): DDIMER in the last 72 hours. Hemoglobin A1C  Recent Labs  04/12/16 0634  HGBA1C 7.9*   Fasting Lipid Panel No results for input(s): CHOL, HDL, LDLCALC, TRIG, CHOLHDL, LDLDIRECT in the last 72 hours. Thyroid Function Tests No results for input(s): TSH, T4TOTAL, T3FREE, THYROIDAB in the last 72 hours.  Invalid input(s): FREET3  TELE  Ventricular pacing  Radiology/Studies  Dg Chest 2 View  04/13/2016  CLINICAL DATA:  82 year old female with a history of possible pacemaker infection EXAM:  CHEST  2 VIEW COMPARISON:  11/13/2015 FINDINGS: Cardiomediastinal silhouette unchanged in size and contour with cardiomegaly. Calcifications of the aortic arch again partially visualized. Low lung volumes with linear opacities at the bilateral lung bases without pleural effusion or pneumothorax. No confluent airspace disease. In the interval, the cardiac pacing generator on the left chest wall has moved towards the midline, and judging the lateral view, 2 prior leads have been extracted and replaced with now a single pacing lead. Degenerative changes of the bilateral shoulders. IMPRESSION: Low lung volumes with likely basilar atelectasis and no evidence of lobar pneumonia or pleural effusion. Background of chronic scarring. There appears to be interval extraction of prior to cardiac pacing leads, replaced by a new single cardiac pacing lead, as well as  medialized pacing generator on the chest wall. Signed, Yvone Neu. Loreta Ave, DO [facility] Radiology Electronically Signed   By: Gilmer Mor D.O.   On: 04/13/2016 08:43    ASSESSMENT AND PLAN  1. PM pocket infection - she is s/p extraction and insertion of a temp-perm PM. She will undergo insertion of a new PPM tomorrow. No evidence of any residual infection.  2. Sinus node dysfunction - she is stable with a temp PM in place.  3. Chronic diastolic heart failure - she is better after lasix. She likes atrial pacing.  Sondra Blixt,M.D.  04/14/2016 9:29 AM

## 2016-04-15 NOTE — Interval H&P Note (Signed)
History and Physical Interval Note:  04/15/2016 7:11 AM  Danielle Benitez  has presented today for surgery, with the diagnosis of post explant/SSS  The various methods of treatment have been discussed with the patient and family. After consideration of risks, benefits and other options for treatment, the patient has consented to  Procedure(s): Pacemaker Implant (N/A) as a surgical intervention .  The patient's history has been reviewed, patient examined, no change in status, stable for surgery.  I have reviewed the patient's chart and labs.  Questions were answered to the patient's satisfaction.     Lewayne Bunting

## 2016-04-15 NOTE — Progress Notes (Signed)
Orthopedic Tech Progress Note Patient Details:  Danielle Benitez Apr 24, 1932 [id number]  Ortho Devices Type of Ortho Device: Arm sling Ortho Device/Splint Location: lue Ortho Device/Splint Interventions: Application   Saul Fordyce 04/15/2016, 11:44 AM

## 2016-04-16 ENCOUNTER — Ambulatory Visit (HOSPITAL_COMMUNITY): Payer: Medicare Other

## 2016-04-16 DIAGNOSIS — I495 Sick sinus syndrome: Secondary | ICD-10-CM | POA: Diagnosis not present

## 2016-04-16 DIAGNOSIS — T827XXD Infection and inflammatory reaction due to other cardiac and vascular devices, implants and grafts, subsequent encounter: Secondary | ICD-10-CM | POA: Diagnosis not present

## 2016-04-16 DIAGNOSIS — N183 Chronic kidney disease, stage 3 (moderate): Secondary | ICD-10-CM | POA: Diagnosis not present

## 2016-04-16 DIAGNOSIS — I130 Hypertensive heart and chronic kidney disease with heart failure and stage 1 through stage 4 chronic kidney disease, or unspecified chronic kidney disease: Secondary | ICD-10-CM | POA: Diagnosis not present

## 2016-04-16 DIAGNOSIS — M1990 Unspecified osteoarthritis, unspecified site: Secondary | ICD-10-CM | POA: Diagnosis not present

## 2016-04-16 DIAGNOSIS — I2510 Atherosclerotic heart disease of native coronary artery without angina pectoris: Secondary | ICD-10-CM | POA: Diagnosis not present

## 2016-04-16 DIAGNOSIS — Z7901 Long term (current) use of anticoagulants: Secondary | ICD-10-CM | POA: Diagnosis not present

## 2016-04-16 DIAGNOSIS — I480 Paroxysmal atrial fibrillation: Secondary | ICD-10-CM | POA: Diagnosis not present

## 2016-04-16 DIAGNOSIS — Z45018 Encounter for adjustment and management of other part of cardiac pacemaker: Secondary | ICD-10-CM | POA: Diagnosis not present

## 2016-04-16 DIAGNOSIS — E1122 Type 2 diabetes mellitus with diabetic chronic kidney disease: Secondary | ICD-10-CM | POA: Diagnosis not present

## 2016-04-16 DIAGNOSIS — T827XXA Infection and inflammatory reaction due to other cardiac and vascular devices, implants and grafts, initial encounter: Secondary | ICD-10-CM | POA: Diagnosis not present

## 2016-04-16 DIAGNOSIS — I5042 Chronic combined systolic (congestive) and diastolic (congestive) heart failure: Secondary | ICD-10-CM | POA: Diagnosis not present

## 2016-04-16 LAB — GLUCOSE, CAPILLARY
GLUCOSE-CAPILLARY: 151 mg/dL — AB (ref 65–99)
GLUCOSE-CAPILLARY: 290 mg/dL — AB (ref 65–99)

## 2016-04-16 MED ORDER — CEPHALEXIN 500 MG PO CAPS: 500.0000 mg | ORAL_CAPSULE | Freq: Two times a day (BID) | ORAL | Status: AC

## 2016-04-16 MED FILL — Gentamicin Sulfate Inj 40 MG/ML: INTRAMUSCULAR | Qty: 2 | Status: AC

## 2016-04-16 MED FILL — Sodium Chloride Irrigation Soln 0.9%: Qty: 500 | Status: AC

## 2016-04-16 NOTE — Care Management Note (Signed)
Case Management Note  Patient Details  Name: Danielle Benitez MRN: [id number] Date of Birth: 1932/08/21  Subjective/Objective:     Pt to discharge home with family, will need home health PT and 3-N-1.  Pt states she had services previously from [facility], looked at list of agencies, and requested [facility] again.                 Expected Discharge Plan:  Home w Home Health Services  Discharge planning Services  CM Consult  Post Acute Care Choice:  Home Health Choice offered to:  Patient  DME Arranged:  3-N-1 DME Agency:  [facility]  HH Arranged:  PT [facility] Agency:  [facility]  Status of Service:  Completed, signed off  [place]dalene [place], [place] 04/16/2016, 3:36 PM

## 2016-04-16 NOTE — Discharge Instructions (Signed)
° ° °  Supplemental Discharge Instructions for  Pacemaker/Defibrillator Patients  Activity No heavy lifting or vigorous activity with your right arm for 6 to 8 weeks.  Do not raise your right arm above your head for one week.  Gradually raise your affected arm as drawn below.             04/19/16                    04/20/16                     04/21/16                   04/22/16 __  NO DRIVING for  1 week   ; you may begin driving on  0/10/07   .  WOUND CARE - Keep the wound area clean and dry.  Do not get this area wet for one week. No showers for one week; you may shower on 04/22/16    . - The tape/steri-strips on your wound will fall off; do not pull them off.  No bandage is needed on the site.  DO  NOT apply any creams, oils, or ointments to the wound area. - If you notice any drainage or discharge from the wound, any swelling or bruising at the site, or you develop a fever > 101? F after you are discharged home, call the office at once.  Special Instructions - You are still able to use cellular telephones; use the ear opposite the side where you have your pacemaker/defibrillator.  Avoid carrying your cellular phone near your device. - When traveling through airports, show security personnel your identification card to avoid being screened in the metal detectors.  Ask the security personnel to use the hand wand. - Avoid arc welding equipment, MRI testing (magnetic resonance imaging), TENS units (transcutaneous nerve stimulators).  Call the office for questions about other devices. - Avoid electrical appliances that are in poor condition or are not properly grounded. - Microwave ovens are safe to be near or to operate.  Additional information for defibrillator patients should your device go off: - If your device goes off ONCE and you feel fine afterward, notify the device clinic nurses. - If your device goes off ONCE and you do not feel well afterward, call 911. - If your device goes off  TWICE, call 911. - If your device goes off THREE times in one day, call 911.  DO NOT DRIVE YOURSELF OR A FAMILY MEMBER WITH A DEFIBRILLATOR TO THE HOSPITAL--CALL 911.

## 2016-04-16 NOTE — Evaluation (Signed)
Physical Therapy Evaluation Patient Details Name: Danielle Benitez MRN: [id number] DOB: 12-17-1931 Today's Date: 04/16/2016   History of Present Illness  pt is an 82 y/o female with pmh DM, MI, afiv, pacer admitted with pacer pocket infection, s/p removal of pacer on the left and insertion of permanent pacer on the R.  Clinical Impression  Pt admitted with/for infected pacer pocket.  Pt currently limited functionally due to the problems listed below.  (see problems list.)  Pt will benefit from PT to maximize function and safety to be able to get home safely with available assist of family.     Follow Up Recommendations Home health PT;Supervision for mobility/OOB    Equipment Recommendations  3in1 (PT)    Recommendations for Other Services       Precautions / Restrictions Precautions Precautions: Fall Restrictions Other Position/Activity Restrictions: reinforced to patient pacer prec.  no arms raised over head. lifting restriction to 5 lbs, minimal use of UE's for mobility      Mobility  Bed Mobility Overal bed mobility: Needs Assistance Bed Mobility: Supine to Sit;Sit to Supine     Supine to sit: Mod assist Sit to supine: Mod assist   General bed mobility comments: cues for best technique and need for assist to overcome inability to use her arms  Transfers Overall transfer level: Needs assistance   Transfers: Sit to/from Stand Sit to Stand: Min assist         General transfer comment: cues for technique without use of UE's and assist to come forward.  Ambulation/Gait Ambulation/Gait assistance: Min assist Ambulation Distance (Feet): 130 Feet Assistive device: Rolling walker (2 wheeled) Gait Pattern/deviations: Step-through pattern   Gait velocity interpretation: at or above normal speed for age/gender General Gait Details: mildly unsteady antalgic gait that family thought was even better than usual.  Stairs            Wheelchair Mobility    Modified  Rankin (Stroke Patients Only)       Balance Overall balance assessment: Needs assistance Sitting-balance support: Feet supported Sitting balance-Leahy Scale: Good     Standing balance support: Bilateral upper extremity supported Standing balance-Leahy Scale: Poor                               Pertinent Vitals/Pain Pain Assessment: Faces Faces Pain Scale: Hurts even more Pain Location: L knee (behind) Pain Descriptors / Indicators: Sore    Home Living Family/patient expects to be discharged to:: Private residence Living Arrangements: Children;Other relatives Available Help at Discharge: Family;Available PRN/intermittently (daughter going to work from home for a week.) Type of Home: Apartment Home Access: Level entry     Home Layout: One level Home Equipment: Environmental consultant - 2 wheels;Cane - quad;Cane - single point       Priorr Function Level of Independence: Independent with assistive device(s)         Comments: Pt notes she was mostly using SPC for ambulation until recently when she started using RW.     Hand Dominance   Dominant Hand: Right    Extremity/Trunk Assessment   Upper Extremity Assessment: Defer to OT evaluation           Lower Extremity Assessment: RLE deficits/detail;LLE deficits/detail RLE Deficits / Details: general weakness  grossly 4/5 LLE Deficits / Details: general weakness and pain behind the knee, grossly 4/5     Communication   Communication: No difficulties  Cognition Arousal/Alertness: Suspect due  to medications Behavior During Therapy: [facility] for tasks assessed/performed Overall Cognitive Status: Within Functional Limits for tasks assessed                      General Comments General comments (skin integrity, edema, etc.): reinforced pacer precautions    Exercises        Assessment/Plan    PT Assessment    PT Diagnosis Generalized weakness;Difficulty walking   PT Problem List    PT Treatment  Interventions     PT Goals (Current goals can be found in the Care Plan section) Acute Rehab PT Goals Patient Stated Goal: get back to doing most for myself PT Goal Formulation: All assessment and education complete, DC therapy    Frequency     Barriers to discharge        Co-evaluation               End of Session   Activity Tolerance: Patient tolerated treatment well Patient left: in bed;with call bell/phone within reach;with family/visitor present Nurse Communication: Mobility status    Functional Assessment Tool Used: clinical judgement Functional Limitation: Mobility: Walking and moving around Mobility: Walking and Moving Around Current Status (R6045): At least 1 percent but less than 20 percent impaired, limited or restricted Mobility: Walking and Moving Around Goal Status [phone]): At least 1 percent but less than 20 percent impaired, limited or restricted Mobility: Walking and Moving Around Discharge Status [phone]): At least 1 percent but less than 20 percent impaired, limited or restricted    Time: 1415-1453 PT Time Calculation (min) (ACUTE ONLY): 38 min   Charges:   PT Evaluation $PT Eval Moderate Complexity: 1 Procedure PT Treatments $Gait Training: 8-22 mins $Therapeutic Activity: 8-22 mins   PT G Codes:   PT G-Codes **NOT FOR INPATIENT CLASS** Functional Assessment Tool Used: clinical judgement Functional Limitation: Mobility: Walking and moving around Mobility: Walking and Moving Around Current Status (W2956): At least 1 percent but less than 20 percent impaired, limited or restricted Mobility: Walking and Moving Around Goal Status [phone]): At least 1 percent but less than 20 percent impaired, limited or restricted Mobility: Walking and Moving Around Discharge Status [phone]): At least 1 percent but less than 20 percent impaired, limited or restricted    Johnay Mano, Eliseo Gum 04/16/2016, 3:50 PM  04/16/2016  [place] Bing, PT [phone]   (pager)

## 2016-04-16 NOTE — Discharge Summary (Signed)
ELECTROPHYSIOLOGY PROCEDURE DISCHARGE SUMMARY    Patient ID: KAROL SKARZYNSKI,  MRN: [id number], DOB/AGE: [birth year] 82 y.o.  Admit date: 04/12/2016 Discharge date: 04/16/2016  Primary Care Physician: Crawford Givens, MD Primary Cardiologist: Shirlee Latch Electrophysiologist: Dr. Ladona Ridgel  Primary Discharge Diagnosis:  1. PPM lead erosion  Secondary Discharge Diagnosis:  1. HTN 2. Symptomatic bradycardia /PPM 3. PAFib     CHA2DS2Vasc is at least 5, on Eliquis (renal dose) 4. DM 6. chronic CHF (systolic) 7. CRI (stageIII)  Allergies  Allergen Reactions  . Beta Adrenergic Blockers Other (See Comments)    Couldn't speak or hear Metoprolol seems ok  . Jardiance [Empagliflozin] Other (See Comments)    Rash, kidney issues     Procedures This Admission:  1. PPM/eads extraction 04/12/16, Dr. Ladona Ridgel with insertion of temp-perm pacer, no immediate procedure complicatioins 3.  Implantation of a MDT dual chamber PPM on 04/15/16 by Dr Ladona Ridgel.  The patient received a Medtronic (serial number [id number]) pacemaker, medtronic (serial number [id number]) right atrial lead and a medtronic (serial number [id number]) right ventricular lead.  Removal of temp-perm pacemaker and lead.  There were no immediate post procedure complications. 2.  CXR on 04/16/16 demonstrated no pneumothorax status post device implantation.   Brief HPI: Danielle Benitez is a 82 y.o. female was referred to electrophysiology/device clinic in the outpatient setting for evaluation of chronic PPM site, was evaluated by Dr. Graciela Husbands noting lead erosion through the skin, and planned for PPM system extraction and new PPM implant.  Risks, benefits, and alternatives to the procedures were reviewed with the patient who wished to proceed.   Hospital Course:  The patient was admitted and underwent PPM/leads extraction with temp-perm system insertion on 04/12/16, she remained afebrile with no evidence of infection or illness, and underwent implantation of a  PPM to the right chest with details as outlined above. She was monitored on telemetry throughout her stay maintaining a paced rhythm, post PPM is A paced, V sensed. With asyncronous pacing she developed some SOB, and treated with lasix, her CXR this morning without acute process and is feeling well.  RN had noticed the patient required help with transfers and the patient mentioned her LLE had "stiffened up" on her since laying in bed for a couple days, she was evaluated by PT who recommended home PT and a toilet riser or 3 N 1, case management states insurance Savon Cobbs cover the 3 N 1 and this was ordered.  Left chest was without hematoma or ecchymosis, sutures are in place and wound is dry/stable, Right chest implant site is dry, no hematoma or ecchymosis.  The device was interrogated and found to be functioning normally.  CXR was obtained and demonstrated no pneumothorax status post device implantation.  Wound care, arm mobility, and restrictions were reviewed with the patient.  She Jeremias Broyhill be given a 1 week course of Keflex.  The patient was examined by Dr. Elberta Fortis and considered stable for discharge to home.  Wound check/suture removal visit is scheduled for the patient as well as 3 month physician visit.   Physical Exam: Filed Vitals:   04/15/16 1352 04/15/16 1954 04/16/16 0544 04/16/16 1355  BP: 112/55 152/68 135/54 111/60  Pulse: 70 70 70 70  Temp: 97.5 F (36.4 C) 98.2 F (36.8 C) 97.8 F (36.6 C) 98.1 F (36.7 C)  TempSrc: Oral Oral Oral Oral  Resp: 15 16 16 18   Height:      Weight:      SpO2: 99%  99% 98% 98%    GEN- The patient is well appearing, alert and oriented x 3 today.   HEENT: normocephalic, atraumatic; sclera clear, conjunctiva pink; hearing intact; neck supple, no JVP Lungs- Clear to ausculation bilaterally, normal work of breathing.  No wheezes, rales, rhonchi Heart- Regular rate and rhythm, no murmurs, rubs or gallops, PMI not laterally displaced GI- soft, non-tender,  non-distended Extremities- no clubbing, cyanosis, or edema MS- no significant deformity or atrophy Skin- warm and dry, no rash or lesion, left/right chest without hematoma/ecchymosis, L chest wound sutures in place, wound is dry, no erythema. Psych- euthymic mood, full affect Neuro- no gross deficits   Labs:   Lab Results  Component Value Date   WBC 7.1 04/15/2016   HGB 10.5* 04/15/2016   HCT 34.8* 04/15/2016   MCV 93.3 04/15/2016   PLT 127* 04/15/2016     Recent Labs Lab 04/13/16 1024 04/15/16 1149  NA 135  --   K 4.5  --   CL 102  --   CO2 26  --   BUN 28*  --   CREATININE 1.94* 1.87*  CALCIUM 8.7*  --   GLUCOSE 283*  --     Discharge Medications:    Medication List    STOP taking these medications        nystatin powder  Commonly known as:  MYCOSTATIN/NYSTOP      TAKE these medications        acetaminophen 325 MG tablet  Commonly known as:  TYLENOL  Take 1-2 tablets (325-650 mg total) by mouth 3 (three) times daily as needed.     albuterol 108 (90 Base) MCG/ACT inhaler  Commonly known as:  PROVENTIL HFA;VENTOLIN HFA  Inhale 2 puffs into the lungs every 6 (six) hours as needed for wheezing or shortness of breath.     allopurinol 300 MG tablet  Commonly known as:  ZYLOPRIM  Take 1 tablet (300 mg total) by mouth daily.     amiodarone 200 MG tablet  Commonly known as:  PACERONE  Take 1 tablet (200 mg total) by mouth daily.     apixaban 2.5 MG Tabs tablet  Commonly known as:  ELIQUIS  Take 1 tablet (2.5 mg total) by mouth 2 (two) times daily.     atorvastatin 20 MG tablet  Commonly known as:  LIPITOR  Take 1 tablet (20 mg total) by mouth daily at 6 PM.     cephALEXin 500 MG capsule  Commonly known as:  KEFLEX  Take 1 capsule (500 mg total) by mouth 2 (two) times daily.     cetirizine 10 MG tablet  Commonly known as:  ZYRTEC  Take 10 mg by mouth daily.     colchicine 0.6 MG tablet  Take 0.5 tablets (0.3 mg total) by mouth daily as needed (for  gout.  okay to fill with mitigare).     DULoxetine 20 MG capsule  Commonly known as:  CYMBALTA  Take 1 capsule (20 mg total) by mouth at bedtime. Take 1 tablet by mouth daily to help treat anxiety and nerves.     fluticasone 50 MCG/ACT nasal spray  Commonly known as:  FLONASE  Place 2 sprays into both nostrils daily.     furosemide 40 MG tablet  Commonly known as:  LASIX  Take 1 tablet (40 mg total) by mouth every other day.     GAS-X MAXIMUM STRENGTH PO  Take by mouth daily.     glimepiride 1 MG tablet  Commonly known as:  AMARYL  Take 1 tablet (1 mg total) by mouth daily with breakfast.     isosorbide mononitrate 30 MG 24 hr tablet  Commonly known as:  IMDUR  Take 1 tablet (30 mg total) by mouth daily.     metoprolol succinate 50 MG 24 hr tablet  Commonly known as:  TOPROL-XL  TAKE 1 TABLET BY MOUTH TWICE A DAY     ondansetron 4 MG disintegrating tablet  Commonly known as:  ZOFRAN ODT  Take 1 tablet (4 mg total) by mouth every 8 (eight) hours as needed for nausea or vomiting.     pantoprazole 40 MG tablet  Commonly known as:  PROTONIX  Take 1 tablet (40 mg total) by mouth daily.     saxagliptin HCl 2.5 MG Tabs tablet  Commonly known as:  ONGLYZA  Take 1 tablet (2.5 mg total) by mouth daily.     senna-docusate 8.6-50 MG tablet  Commonly known as:  Senokot-S  Take 1 tablet by mouth daily as needed for mild constipation.     traZODone 50 MG tablet  Commonly known as:  DESYREL  Take 0.5-1 tablets (25-50 mg total) by mouth at bedtime as needed for sleep. Don't take >50mg  in a day.     triamcinolone 0.025 % cream  Commonly known as:  KENALOG  Apply 1 application topically 4 (four) times daily as needed (itching).        Disposition:  Home with home PT as per PT recommendations Discharge Instructions    Diet - low sodium heart healthy    Complete by:  As directed      Increase activity slowly    Complete by:  As directed           Follow-up Information     Follow up with [facility] On 04/22/2016.   Specialty:  Cardiology   Why:  9:00AM, wound check, suture removal   Contact information:   [address], [address] [place] [address] [phone]      Follow up with Lewayne Bunting, MD On 07/06/2016.   Specialty:  Cardiology   Why:  10:45AM   Contact information:   [address] [place] [address] [phone]       Duration of Discharge Encounter: Greater than 30 minutes including physician time.  Norma Fredrickson, PA-C 04/16/2016 3:45 PM   I have seen and examined this patient with Francis Dowse.  Agree with above, note added to reflect my findings.  On exam, regular rhythm, no murmurs, lungs clear.  Had pacemaker extraction with reimplant.  CXR and interrogation without issue post implant.  Plan for discharge home with follow up in device clinic in 10 days..    Fatime Biswell M. Zophia Marrone MD 04/17/2016 3:21 PM

## 2016-04-18 DIAGNOSIS — N183 Chronic kidney disease, stage 3 (moderate): Secondary | ICD-10-CM | POA: Diagnosis not present

## 2016-04-18 DIAGNOSIS — L89312 Pressure ulcer of right buttock, stage 2: Secondary | ICD-10-CM | POA: Diagnosis not present

## 2016-04-18 DIAGNOSIS — I480 Paroxysmal atrial fibrillation: Secondary | ICD-10-CM | POA: Diagnosis not present

## 2016-04-18 DIAGNOSIS — Z950 Presence of cardiac pacemaker: Secondary | ICD-10-CM | POA: Diagnosis not present

## 2016-04-18 DIAGNOSIS — Z48812 Encounter for surgical aftercare following surgery on the circulatory system: Secondary | ICD-10-CM | POA: Diagnosis not present

## 2016-04-18 DIAGNOSIS — I5022 Chronic systolic (congestive) heart failure: Secondary | ICD-10-CM | POA: Diagnosis not present

## 2016-04-18 DIAGNOSIS — E1122 Type 2 diabetes mellitus with diabetic chronic kidney disease: Secondary | ICD-10-CM | POA: Diagnosis not present

## 2016-04-18 DIAGNOSIS — I130 Hypertensive heart and chronic kidney disease with heart failure and stage 1 through stage 4 chronic kidney disease, or unspecified chronic kidney disease: Secondary | ICD-10-CM | POA: Diagnosis not present

## 2016-04-18 DIAGNOSIS — I2510 Atherosclerotic heart disease of native coronary artery without angina pectoris: Secondary | ICD-10-CM | POA: Diagnosis not present

## 2016-04-18 DIAGNOSIS — Z7984 Long term (current) use of oral hypoglycemic drugs: Secondary | ICD-10-CM | POA: Diagnosis not present

## 2016-04-19 DIAGNOSIS — Z48812 Encounter for surgical aftercare following surgery on the circulatory system: Secondary | ICD-10-CM | POA: Diagnosis not present

## 2016-04-19 DIAGNOSIS — I2510 Atherosclerotic heart disease of native coronary artery without angina pectoris: Secondary | ICD-10-CM | POA: Diagnosis not present

## 2016-04-19 DIAGNOSIS — I130 Hypertensive heart and chronic kidney disease with heart failure and stage 1 through stage 4 chronic kidney disease, or unspecified chronic kidney disease: Secondary | ICD-10-CM | POA: Diagnosis not present

## 2016-04-19 DIAGNOSIS — L89312 Pressure ulcer of right buttock, stage 2: Secondary | ICD-10-CM | POA: Diagnosis not present

## 2016-04-19 DIAGNOSIS — Z950 Presence of cardiac pacemaker: Secondary | ICD-10-CM | POA: Diagnosis not present

## 2016-04-19 DIAGNOSIS — E1122 Type 2 diabetes mellitus with diabetic chronic kidney disease: Secondary | ICD-10-CM | POA: Diagnosis not present

## 2016-04-19 DIAGNOSIS — I480 Paroxysmal atrial fibrillation: Secondary | ICD-10-CM | POA: Diagnosis not present

## 2016-04-19 DIAGNOSIS — Z7984 Long term (current) use of oral hypoglycemic drugs: Secondary | ICD-10-CM | POA: Diagnosis not present

## 2016-04-19 DIAGNOSIS — I5022 Chronic systolic (congestive) heart failure: Secondary | ICD-10-CM | POA: Diagnosis not present

## 2016-04-19 DIAGNOSIS — N183 Chronic kidney disease, stage 3 (moderate): Secondary | ICD-10-CM | POA: Diagnosis not present

## 2016-04-20 ENCOUNTER — Telehealth: Payer: Self-pay

## 2016-04-20 NOTE — Telephone Encounter (Signed)
Danielle Benitez pts daughter (DPR signed) said cardiologist started pt on abx and now pt has vaginal itching and burning. Request diflucan to [facility]. Danielle Benitez request cb when done. Did not add diflucan back to current med list from hx med list because diflucan 100 mg and 150 mg is on hx list. Please advise.

## 2016-04-20 NOTE — Telephone Encounter (Signed)
Daughter advised.

## 2016-04-20 NOTE — Telephone Encounter (Signed)
Would avoid diflucan if possible given her other medical issues/meds.  Would have her try OTC topical monistat first.  Thanks. Update me as needed.

## 2016-04-21 DIAGNOSIS — N183 Chronic kidney disease, stage 3 (moderate): Secondary | ICD-10-CM | POA: Diagnosis not present

## 2016-04-21 DIAGNOSIS — Z48812 Encounter for surgical aftercare following surgery on the circulatory system: Secondary | ICD-10-CM | POA: Diagnosis not present

## 2016-04-21 DIAGNOSIS — I130 Hypertensive heart and chronic kidney disease with heart failure and stage 1 through stage 4 chronic kidney disease, or unspecified chronic kidney disease: Secondary | ICD-10-CM | POA: Diagnosis not present

## 2016-04-21 DIAGNOSIS — I5022 Chronic systolic (congestive) heart failure: Secondary | ICD-10-CM | POA: Diagnosis not present

## 2016-04-21 DIAGNOSIS — Z7984 Long term (current) use of oral hypoglycemic drugs: Secondary | ICD-10-CM | POA: Diagnosis not present

## 2016-04-21 DIAGNOSIS — I480 Paroxysmal atrial fibrillation: Secondary | ICD-10-CM | POA: Diagnosis not present

## 2016-04-21 DIAGNOSIS — Z950 Presence of cardiac pacemaker: Secondary | ICD-10-CM | POA: Diagnosis not present

## 2016-04-21 DIAGNOSIS — I2510 Atherosclerotic heart disease of native coronary artery without angina pectoris: Secondary | ICD-10-CM | POA: Diagnosis not present

## 2016-04-21 DIAGNOSIS — E1122 Type 2 diabetes mellitus with diabetic chronic kidney disease: Secondary | ICD-10-CM | POA: Diagnosis not present

## 2016-04-21 DIAGNOSIS — L89312 Pressure ulcer of right buttock, stage 2: Secondary | ICD-10-CM | POA: Diagnosis not present

## 2016-04-22 ENCOUNTER — Encounter: Payer: Self-pay | Admitting: Internal Medicine

## 2016-04-22 ENCOUNTER — Ambulatory Visit (INDEPENDENT_AMBULATORY_CARE_PROVIDER_SITE_OTHER): Payer: Medicare Other | Admitting: *Deleted

## 2016-04-22 DIAGNOSIS — I480 Paroxysmal atrial fibrillation: Secondary | ICD-10-CM

## 2016-04-22 DIAGNOSIS — R001 Bradycardia, unspecified: Secondary | ICD-10-CM | POA: Diagnosis not present

## 2016-04-22 DIAGNOSIS — Z950 Presence of cardiac pacemaker: Secondary | ICD-10-CM

## 2016-04-22 LAB — CUP PACEART INCLINIC DEVICE CHECK
Battery Voltage: 3.12 V
Brady Statistic AP VS Percent: 99.18 %
Brady Statistic RA Percent Paced: 99.98 %
Brady Statistic RV Percent Paced: 0.79 %
Implantable Lead Implant Date: 20170706
Implantable Lead Location: 753859
Implantable Lead Location: 753860
Implantable Lead Model: 5076
Implantable Lead Model: 5076
Lead Channel Impedance Value: 380 Ohm
Lead Channel Impedance Value: 418 Ohm
Lead Channel Impedance Value: 456 Ohm
Lead Channel Pacing Threshold Amplitude: 1 V
Lead Channel Pacing Threshold Pulse Width: 0.4 ms
Lead Channel Setting Pacing Pulse Width: 0.4 ms
MDC IDC LEAD IMPLANT DT: 20170706
MDC IDC MSMT LEADCHNL RA PACING THRESHOLD PULSEWIDTH: 0.4 ms
MDC IDC MSMT LEADCHNL RV IMPEDANCE VALUE: 589 Ohm
MDC IDC MSMT LEADCHNL RV PACING THRESHOLD AMPLITUDE: 1 V
MDC IDC MSMT LEADCHNL RV SENSING INTR AMPL: 3.875 mV
MDC IDC SESS DTM: 20170713102905
MDC IDC SET LEADCHNL RA PACING AMPLITUDE: 3.5 V
MDC IDC SET LEADCHNL RV PACING AMPLITUDE: 3.5 V
MDC IDC SET LEADCHNL RV SENSING SENSITIVITY: 2.8 mV
MDC IDC STAT BRADY AP VP PERCENT: 0.79 %
MDC IDC STAT BRADY AS VP PERCENT: 0 %
MDC IDC STAT BRADY AS VS PERCENT: 0.02 %

## 2016-04-22 NOTE — Progress Notes (Signed)
Wound check appointment. Steri-strips removed from R chest implant site, stitches removed from L chest extraction site. Wounds without redness or edema. Incision edges approximated, wounds well healed. Normal device function. Thresholds, RA sensing, and impedances consistent with implant measurements. Note that R-wave amplitude 3.49mV today, R-waves 8.47mV at implant. Device programmed at 3.5V with auto capture programmed on for extra safety margin until 3 month visit. Histogram distribution blunted, rate response historically on (in last PPM) and turned back on at historical settings today. No mode switches or high ventricular rates noted. Patient educated about wound care, arm mobility, lifting restrictions. ROV in 3 months with GT.

## 2016-04-23 DIAGNOSIS — I480 Paroxysmal atrial fibrillation: Secondary | ICD-10-CM | POA: Diagnosis not present

## 2016-04-23 DIAGNOSIS — L89312 Pressure ulcer of right buttock, stage 2: Secondary | ICD-10-CM | POA: Diagnosis not present

## 2016-04-23 DIAGNOSIS — Z7984 Long term (current) use of oral hypoglycemic drugs: Secondary | ICD-10-CM | POA: Diagnosis not present

## 2016-04-23 DIAGNOSIS — S21102A Unspecified open wound of left front wall of thorax without penetration into thoracic cavity, initial encounter: Secondary | ICD-10-CM | POA: Diagnosis not present

## 2016-04-23 DIAGNOSIS — Z48812 Encounter for surgical aftercare following surgery on the circulatory system: Secondary | ICD-10-CM | POA: Diagnosis not present

## 2016-04-23 DIAGNOSIS — E1122 Type 2 diabetes mellitus with diabetic chronic kidney disease: Secondary | ICD-10-CM | POA: Diagnosis not present

## 2016-04-23 DIAGNOSIS — N183 Chronic kidney disease, stage 3 (moderate): Secondary | ICD-10-CM | POA: Diagnosis not present

## 2016-04-23 DIAGNOSIS — S21109A Unspecified open wound of unspecified front wall of thorax without penetration into thoracic cavity, initial encounter: Secondary | ICD-10-CM | POA: Diagnosis not present

## 2016-04-23 DIAGNOSIS — I130 Hypertensive heart and chronic kidney disease with heart failure and stage 1 through stage 4 chronic kidney disease, or unspecified chronic kidney disease: Secondary | ICD-10-CM | POA: Diagnosis not present

## 2016-04-23 DIAGNOSIS — I5022 Chronic systolic (congestive) heart failure: Secondary | ICD-10-CM | POA: Diagnosis not present

## 2016-04-23 DIAGNOSIS — Z950 Presence of cardiac pacemaker: Secondary | ICD-10-CM | POA: Diagnosis not present

## 2016-04-23 DIAGNOSIS — I2510 Atherosclerotic heart disease of native coronary artery without angina pectoris: Secondary | ICD-10-CM | POA: Diagnosis not present

## 2016-04-26 ENCOUNTER — Encounter: Payer: Self-pay | Admitting: Internal Medicine

## 2016-04-26 DIAGNOSIS — I5022 Chronic systolic (congestive) heart failure: Secondary | ICD-10-CM | POA: Diagnosis not present

## 2016-04-26 DIAGNOSIS — E1122 Type 2 diabetes mellitus with diabetic chronic kidney disease: Secondary | ICD-10-CM | POA: Diagnosis not present

## 2016-04-26 DIAGNOSIS — I2510 Atherosclerotic heart disease of native coronary artery without angina pectoris: Secondary | ICD-10-CM | POA: Diagnosis not present

## 2016-04-26 DIAGNOSIS — I480 Paroxysmal atrial fibrillation: Secondary | ICD-10-CM | POA: Diagnosis not present

## 2016-04-26 DIAGNOSIS — Z950 Presence of cardiac pacemaker: Secondary | ICD-10-CM | POA: Diagnosis not present

## 2016-04-26 DIAGNOSIS — L89312 Pressure ulcer of right buttock, stage 2: Secondary | ICD-10-CM | POA: Diagnosis not present

## 2016-04-26 DIAGNOSIS — Z48812 Encounter for surgical aftercare following surgery on the circulatory system: Secondary | ICD-10-CM | POA: Diagnosis not present

## 2016-04-26 DIAGNOSIS — I130 Hypertensive heart and chronic kidney disease with heart failure and stage 1 through stage 4 chronic kidney disease, or unspecified chronic kidney disease: Secondary | ICD-10-CM | POA: Diagnosis not present

## 2016-04-26 DIAGNOSIS — N183 Chronic kidney disease, stage 3 (moderate): Secondary | ICD-10-CM | POA: Diagnosis not present

## 2016-04-26 DIAGNOSIS — Z7984 Long term (current) use of oral hypoglycemic drugs: Secondary | ICD-10-CM | POA: Diagnosis not present

## 2016-04-27 ENCOUNTER — Telehealth: Payer: Self-pay | Admitting: Internal Medicine

## 2016-04-27 DIAGNOSIS — Z7984 Long term (current) use of oral hypoglycemic drugs: Secondary | ICD-10-CM | POA: Diagnosis not present

## 2016-04-27 DIAGNOSIS — E1122 Type 2 diabetes mellitus with diabetic chronic kidney disease: Secondary | ICD-10-CM | POA: Diagnosis not present

## 2016-04-27 DIAGNOSIS — I480 Paroxysmal atrial fibrillation: Secondary | ICD-10-CM | POA: Diagnosis not present

## 2016-04-27 DIAGNOSIS — I5022 Chronic systolic (congestive) heart failure: Secondary | ICD-10-CM | POA: Diagnosis not present

## 2016-04-27 DIAGNOSIS — Z950 Presence of cardiac pacemaker: Secondary | ICD-10-CM | POA: Diagnosis not present

## 2016-04-27 DIAGNOSIS — I2510 Atherosclerotic heart disease of native coronary artery without angina pectoris: Secondary | ICD-10-CM | POA: Diagnosis not present

## 2016-04-27 DIAGNOSIS — I130 Hypertensive heart and chronic kidney disease with heart failure and stage 1 through stage 4 chronic kidney disease, or unspecified chronic kidney disease: Secondary | ICD-10-CM | POA: Diagnosis not present

## 2016-04-27 DIAGNOSIS — Z48812 Encounter for surgical aftercare following surgery on the circulatory system: Secondary | ICD-10-CM | POA: Diagnosis not present

## 2016-04-27 DIAGNOSIS — N183 Chronic kidney disease, stage 3 (moderate): Secondary | ICD-10-CM | POA: Diagnosis not present

## 2016-04-27 DIAGNOSIS — L89312 Pressure ulcer of right buttock, stage 2: Secondary | ICD-10-CM | POA: Diagnosis not present

## 2016-04-27 NOTE — Telephone Encounter (Signed)
Follow Up:; ° ° °Returning your call. °

## 2016-04-27 NOTE — Telephone Encounter (Signed)
-----   Message from Little Ishikawa, NP sent at 04/26/2016  4:16 PM EDT ----- Please tell [name] that she does not have any evidence of a blood clot in her legs. Thank you!

## 2016-04-27 NOTE — Telephone Encounter (Signed)
Returned call to [employer] and she has been made aware of pts vas [place] results.

## 2016-04-28 DIAGNOSIS — Z48812 Encounter for surgical aftercare following surgery on the circulatory system: Secondary | ICD-10-CM | POA: Diagnosis not present

## 2016-04-28 DIAGNOSIS — I130 Hypertensive heart and chronic kidney disease with heart failure and stage 1 through stage 4 chronic kidney disease, or unspecified chronic kidney disease: Secondary | ICD-10-CM | POA: Diagnosis not present

## 2016-04-28 DIAGNOSIS — E1122 Type 2 diabetes mellitus with diabetic chronic kidney disease: Secondary | ICD-10-CM | POA: Diagnosis not present

## 2016-04-28 DIAGNOSIS — I5022 Chronic systolic (congestive) heart failure: Secondary | ICD-10-CM | POA: Diagnosis not present

## 2016-04-28 DIAGNOSIS — Z950 Presence of cardiac pacemaker: Secondary | ICD-10-CM | POA: Diagnosis not present

## 2016-04-28 DIAGNOSIS — I480 Paroxysmal atrial fibrillation: Secondary | ICD-10-CM | POA: Diagnosis not present

## 2016-04-28 DIAGNOSIS — L89312 Pressure ulcer of right buttock, stage 2: Secondary | ICD-10-CM | POA: Diagnosis not present

## 2016-04-28 DIAGNOSIS — I2510 Atherosclerotic heart disease of native coronary artery without angina pectoris: Secondary | ICD-10-CM | POA: Diagnosis not present

## 2016-04-28 DIAGNOSIS — N183 Chronic kidney disease, stage 3 (moderate): Secondary | ICD-10-CM | POA: Diagnosis not present

## 2016-04-28 DIAGNOSIS — Z7984 Long term (current) use of oral hypoglycemic drugs: Secondary | ICD-10-CM | POA: Diagnosis not present

## 2016-04-29 DIAGNOSIS — Z48812 Encounter for surgical aftercare following surgery on the circulatory system: Secondary | ICD-10-CM | POA: Diagnosis not present

## 2016-04-29 DIAGNOSIS — I130 Hypertensive heart and chronic kidney disease with heart failure and stage 1 through stage 4 chronic kidney disease, or unspecified chronic kidney disease: Secondary | ICD-10-CM | POA: Diagnosis not present

## 2016-04-29 DIAGNOSIS — Z7984 Long term (current) use of oral hypoglycemic drugs: Secondary | ICD-10-CM | POA: Diagnosis not present

## 2016-04-29 DIAGNOSIS — I2510 Atherosclerotic heart disease of native coronary artery without angina pectoris: Secondary | ICD-10-CM | POA: Diagnosis not present

## 2016-04-29 DIAGNOSIS — E1122 Type 2 diabetes mellitus with diabetic chronic kidney disease: Secondary | ICD-10-CM | POA: Diagnosis not present

## 2016-04-29 DIAGNOSIS — L89312 Pressure ulcer of right buttock, stage 2: Secondary | ICD-10-CM | POA: Diagnosis not present

## 2016-04-29 DIAGNOSIS — Z950 Presence of cardiac pacemaker: Secondary | ICD-10-CM | POA: Diagnosis not present

## 2016-04-29 DIAGNOSIS — I480 Paroxysmal atrial fibrillation: Secondary | ICD-10-CM | POA: Diagnosis not present

## 2016-04-29 DIAGNOSIS — N183 Chronic kidney disease, stage 3 (moderate): Secondary | ICD-10-CM | POA: Diagnosis not present

## 2016-04-29 DIAGNOSIS — I5022 Chronic systolic (congestive) heart failure: Secondary | ICD-10-CM | POA: Diagnosis not present

## 2016-05-04 DIAGNOSIS — I130 Hypertensive heart and chronic kidney disease with heart failure and stage 1 through stage 4 chronic kidney disease, or unspecified chronic kidney disease: Secondary | ICD-10-CM | POA: Diagnosis not present

## 2016-05-04 DIAGNOSIS — I5022 Chronic systolic (congestive) heart failure: Secondary | ICD-10-CM | POA: Diagnosis not present

## 2016-05-04 DIAGNOSIS — Z950 Presence of cardiac pacemaker: Secondary | ICD-10-CM | POA: Diagnosis not present

## 2016-05-04 DIAGNOSIS — Z7984 Long term (current) use of oral hypoglycemic drugs: Secondary | ICD-10-CM | POA: Diagnosis not present

## 2016-05-04 DIAGNOSIS — I2510 Atherosclerotic heart disease of native coronary artery without angina pectoris: Secondary | ICD-10-CM | POA: Diagnosis not present

## 2016-05-04 DIAGNOSIS — I480 Paroxysmal atrial fibrillation: Secondary | ICD-10-CM | POA: Diagnosis not present

## 2016-05-04 DIAGNOSIS — Z48812 Encounter for surgical aftercare following surgery on the circulatory system: Secondary | ICD-10-CM | POA: Diagnosis not present

## 2016-05-04 DIAGNOSIS — L89312 Pressure ulcer of right buttock, stage 2: Secondary | ICD-10-CM | POA: Diagnosis not present

## 2016-05-04 DIAGNOSIS — N183 Chronic kidney disease, stage 3 (moderate): Secondary | ICD-10-CM | POA: Diagnosis not present

## 2016-05-04 DIAGNOSIS — E1122 Type 2 diabetes mellitus with diabetic chronic kidney disease: Secondary | ICD-10-CM | POA: Diagnosis not present

## 2016-05-11 ENCOUNTER — Telehealth: Payer: Self-pay

## 2016-05-11 NOTE — Telephone Encounter (Signed)
[name] pts daughter said pt is SOB when she walks; no CP or SOB when sitting and pt is up 3 lbs this morning.pt has taken lasix this morning. Pt feels "puffy". [name] has contacted cardiologist office but no cb yet. Please advise. [place].

## 2016-05-12 ENCOUNTER — Encounter (HOSPITAL_COMMUNITY): Payer: Self-pay

## 2016-05-12 ENCOUNTER — Ambulatory Visit (HOSPITAL_COMMUNITY)
Admission: RE | Admit: 2016-05-12 | Discharge: 2016-05-12 | Disposition: A | Discharge status: Home / Self Care | Payer: Medicare Other | Source: Ambulatory Visit | Attending: Cardiology | Admitting: Cardiology

## 2016-05-12 VITALS — BP 112/70 | HR 84 | Wt 180.5 lb

## 2016-05-12 DIAGNOSIS — N183 Chronic kidney disease, stage 3 unspecified: Secondary | ICD-10-CM

## 2016-05-12 DIAGNOSIS — Z950 Presence of cardiac pacemaker: Secondary | ICD-10-CM | POA: Insufficient documentation

## 2016-05-12 DIAGNOSIS — Z7984 Long term (current) use of oral hypoglycemic drugs: Secondary | ICD-10-CM | POA: Diagnosis not present

## 2016-05-12 DIAGNOSIS — I130 Hypertensive heart and chronic kidney disease with heart failure and stage 1 through stage 4 chronic kidney disease, or unspecified chronic kidney disease: Secondary | ICD-10-CM | POA: Diagnosis not present

## 2016-05-12 DIAGNOSIS — I5022 Chronic systolic (congestive) heart failure: Secondary | ICD-10-CM | POA: Diagnosis not present

## 2016-05-12 DIAGNOSIS — I350 Nonrheumatic aortic (valve) stenosis: Secondary | ICD-10-CM | POA: Diagnosis not present

## 2016-05-12 DIAGNOSIS — M109 Gout, unspecified: Secondary | ICD-10-CM | POA: Insufficient documentation

## 2016-05-12 DIAGNOSIS — I2510 Atherosclerotic heart disease of native coronary artery without angina pectoris: Secondary | ICD-10-CM | POA: Diagnosis not present

## 2016-05-12 DIAGNOSIS — Z79899 Other long term (current) drug therapy: Secondary | ICD-10-CM | POA: Diagnosis not present

## 2016-05-12 DIAGNOSIS — R001 Bradycardia, unspecified: Secondary | ICD-10-CM | POA: Diagnosis not present

## 2016-05-12 DIAGNOSIS — Z7901 Long term (current) use of anticoagulants: Secondary | ICD-10-CM | POA: Diagnosis not present

## 2016-05-12 DIAGNOSIS — D509 Iron deficiency anemia, unspecified: Secondary | ICD-10-CM | POA: Diagnosis not present

## 2016-05-12 DIAGNOSIS — E785 Hyperlipidemia, unspecified: Secondary | ICD-10-CM | POA: Insufficient documentation

## 2016-05-12 DIAGNOSIS — I4891 Unspecified atrial fibrillation: Secondary | ICD-10-CM | POA: Diagnosis not present

## 2016-05-12 DIAGNOSIS — E1122 Type 2 diabetes mellitus with diabetic chronic kidney disease: Secondary | ICD-10-CM | POA: Insufficient documentation

## 2016-05-12 LAB — LIPID PANEL
Cholesterol: 128 mg/dL (ref 0–200)
HDL: 50 mg/dL (ref >40)
LDL CALC: 57 mg/dL (ref 0–99)
Total CHOL/HDL Ratio: 2.6 RATIO
Triglycerides: 104 mg/dL (ref <150)
VLDL: 21 mg/dL (ref 0–40)

## 2016-05-12 LAB — COMPREHENSIVE METABOLIC PANEL
ALBUMIN: 3.3 g/dL — AB (ref 3.5–5.0)
ALK PHOS: 111 U/L (ref 38–126)
ALT: 21 U/L (ref 14–54)
AST: 35 U/L (ref 15–41)
Anion gap: 7 (ref 5–15)
BILIRUBIN TOTAL: 0.3 mg/dL (ref 0.3–1.2)
BUN: 26 mg/dL — AB (ref 6–20)
CALCIUM: 9.1 mg/dL (ref 8.9–10.3)
CO2: 24 mmol/L (ref 22–32)
CREATININE: 1.52 mg/dL — AB (ref 0.44–1.00)
Chloride: 106 mmol/L (ref 101–111)
GFR calc Af Amer: 36 mL/min — ABNORMAL LOW (ref >60)
GFR calc non Af Amer: 31 mL/min — ABNORMAL LOW (ref >60)
GLUCOSE: 191 mg/dL — AB (ref 65–99)
Potassium: 5 mmol/L (ref 3.5–5.1)
Sodium: 137 mmol/L (ref 135–145)
TOTAL PROTEIN: 6.7 g/dL (ref 6.5–8.1)

## 2016-05-12 LAB — TSH: TSH: 2.569 u[IU]/mL (ref 0.350–4.500)

## 2016-05-12 MED ORDER — FUROSEMIDE 40 MG PO TABS: 40.0000 mg | ORAL_TABLET | Freq: Every day | ORAL | 6 refills | Status: DC

## 2016-05-12 NOTE — Progress Notes (Signed)
Patient ID: Danielle Benitez, female   DOB: [birth year], 82 y.o.   MRN: [id number] PCP: Dr. Para March HF Cardiology: Dr. Shirlee Latch  82 yo with history of HTN, orthostatic hypotension, symptomatic bradycardia with Medtronic PPM, paroxysmal atrial fibrillation chronic systolic CHF, CKD stage III, and iron deficiency anemia presents for cardiology followup. Around the beginning of 9/16, she began to develop exertional dyspnea. She was seen in the cardiology office on 07/25/15. She was in atrial fibrillation with RVR and echo showed EF 35-40% (had been 50-55% in past). PPM interrogation showed that atrial fibrillation had begun 07/11/15, so exertional dyspnea had preceded this by about 4 weeks. She was admitted from 10/7-10/17. She was anemic with hemoglobin 8.7. She was heme negative but Fe deficient. EGD was not done. She had TEE-guided DCCV to NSR. She also had AKI with creatinine up to 3.41, but it trended back down. She was sent home in NSR. She was readmitted 10/19-10/23 with recurrent atrial fibrillation and acute on chronic systolic CHF. She was diuresed about 10 lbs. It was decided to rate control her rather than repeat cardioversion, and she was sent home. she was only at home for about a day and developed increased dyspnea so came back to the ER and was re-admitted.  She was diuresed with IV Lasix and started on amiodarone.  She underwent DCCV on 10/26 but atrial fibrillation recurred on 10/29.  More amiodarone was loaded, and she went back into NSR.   Lexiscan Cardiolite in 11/16 showed basal inferior/inferolatearl fixed defect consistent with infarction but no ischemia.    Repeat echo was done 1/17.  This showed EF 45% with normal RV size and mildly decreased systolic function.   She was admitted in 7/17 with suspected infection of her device.  She had her PPM extracted and got a temporary-permanent device with later Medtronic PPM replacement.   Weight is up about 13 lbs compared to prior  appointment with me.  She says that she has been less active for the last month (correlating with the PPM replacement).  She uses a walker at home.  Some dyspnea walking around her house.  She has orthopnea, no PND.  No chest pain. SBP runs high (140s-150s) at home at times but is normal here.  She got dizzy from hydralazine in the past and stopped it.   ECG: a-paced, RBBB  Labs (7/16): LDL 48, HDL 55 Labs (10/16): K 3.9, creatinine 2.08 Labs (11/16): K 4.9, creatinine 2.5, LFTs normal, TSH normal, HCT 43.6 Labs (12/16): K 5.2, creatinine 1.73, HCT 41.9, plts 123 Labs (3/17): LFTs normal Labs (7/17): creatinine 1.87  PMH: 1. HTN but also with history of orthostatic hypotension. 2. CKD stage III 3. Aortic stenosis: Mild 4. Symptomatic bradycardia s/p Medtronic PPM. - PPM infection 7/17 with device replacement.  5. Hyperlipidemia 6. Atrial fibrillation: Paroxysmal. TEE-guided DCCV in 10/16. She had DCCV again latera in 10/16 and was started on amiodarone.   - PFTs (11/16) with normal spirometry, severe diffusion defect c/w pulmonary vascular disease.  7. Type II diabetes 8. Gout 9. Fe-deficiency anemia 10. Chronic systolic CHF: Echo 11/14 with EF 50-55%. Echo (10/16) with EF 35-40%, moderate LV dilation, diffuse hypokinesis, mild AS, moderate MR, PASP 48 mmHg.  Lexiscan Cardiolite (11/16) with EF 31%, basal inferior and inferolateral fixed defect possibly consistent with infarction, no ischemia; degree of LV hypokinesis was out of proportion to the perfusion defect.  Echo (1/17) with EF 45%, normal RV size with mildly decreased systolic function, aortic sclerosis  without stenosis.  11. Esophageal dysmotility  SH: Lives alone but daughter is nearby.  Nonsmoker. No ETOH.    FH: No premature CAD.   ROS: All systems reviewed and negative except as per HPI.    Current Outpatient Prescriptions  Medication Sig Dispense Refill  . acetaminophen (TYLENOL) 325 MG tablet Take 1-2 tablets  (325-650 mg total) by mouth 3 (three) times daily as needed.    Marland Kitchen albuterol (PROVENTIL HFA;VENTOLIN HFA) 108 (90 BASE) MCG/ACT inhaler Inhale 2 puffs into the lungs every 6 (six) hours as needed for wheezing or shortness of breath. 1 Inhaler 0  . allopurinol (ZYLOPRIM) 300 MG tablet Take 1 tablet (300 mg total) by mouth daily. 90 tablet 3  . amiodarone (PACERONE) 200 MG tablet Take 1 tablet (200 mg total) by mouth daily. 90 tablet 3  . apixaban (ELIQUIS) 2.5 MG TABS tablet Take 1 tablet (2.5 mg total) by mouth 2 (two) times daily. 60 tablet 11  . atorvastatin (LIPITOR) 20 MG tablet Take 1 tablet (20 mg total) by mouth daily at 6 PM. 30 tablet 6  . cetirizine (ZYRTEC) 10 MG tablet Take 10 mg by mouth daily.    . DULoxetine (CYMBALTA) 20 MG capsule Take 1 capsule (20 mg total) by mouth at bedtime. Take 1 tablet by mouth daily to help treat anxiety and nerves. 90 capsule 3  . furosemide (LASIX) 40 MG tablet Take 1 tablet (40 mg total) by mouth daily. 30 tablet 6  . glimepiride (AMARYL) 1 MG tablet Take 1 tablet (1 mg total) by mouth daily with breakfast. 90 tablet 3  . isosorbide mononitrate (IMDUR) 30 MG 24 hr tablet Take 1 tablet (30 mg total) by mouth daily. 30 tablet 6  . metoprolol succinate (TOPROL-XL) 50 MG 24 hr tablet TAKE 1 TABLET BY MOUTH TWICE A DAY 60 tablet 6  . ondansetron (ZOFRAN ODT) 4 MG disintegrating tablet Take 1 tablet (4 mg total) by mouth every 8 (eight) hours as needed for nausea or vomiting. 20 tablet 0  . ONE TOUCH ULTRA TEST test strip     . pantoprazole (PROTONIX) 40 MG tablet Take 1 tablet (40 mg total) by mouth daily. 30 tablet 3  . saxagliptin HCl (ONGLYZA) 2.5 MG TABS tablet Take 1 tablet (2.5 mg total) by mouth daily. 30 tablet 2  . senna-docusate (SENOKOT-S) 8.6-50 MG tablet Take 1 tablet by mouth daily as needed for mild constipation.    . Simethicone (GAS-X MAXIMUM STRENGTH PO) Take by mouth daily.    . traZODone (DESYREL) 50 MG tablet Take 0.5-1 tablets (25-50 mg  total) by mouth at bedtime as needed for sleep. Don't take >50mg  in a day. 30 tablet 3  . fluticasone (FLONASE) 50 MCG/ACT nasal spray Place 2 sprays into both nostrils daily. (Patient not taking: Reported on 05/12/2016) 16 g 1   No current facility-administered medications for this encounter.    BP 112/70   Pulse 84   Wt 180 lb 8 oz (81.9 kg)   SpO2 98%   BMI 35.25 kg/m  General: NAD Neck: JVP 8-9 cm, no thyromegaly or thyroid nodule.  Lungs: Clear to auscultation bilaterally with normal respiratory effort. CV: Nondisplaced PMI.  Heart regular S1/S2, no S3/S4, no murmur.  1+ ankle edema.  No carotid bruit.  Normal pedal pulses.  Abdomen: Soft, nontender, no hepatosplenomegaly, no distention.  Skin: Intact without lesions or rashes.  Neurologic: Alert and oriented x 3.  Psych: Normal affect. Extremities: No clubbing or cyanosis.  HEENT:  Normal.   Assessment/Plan: 1. Chronic systolic CHF: EF 42-39% by echo 10/16. Etiology uncertain: could be tachy-mediated cardiomyopathy, but symptoms seemed to predate the onset of atrial fibrillation. Possible viral myocarditis. Cannot rule out ischemic cardiomyopathy, but no definite anginal symptoms. Cardiolite in 11/16 showed a small area possibly of prior infarction but EF and wall motion abnormalities were out of proportion to the perfusion defect. By 1/17, EF was improved to 45%.  NYHA class III symptoms today with some volume overload on exam.   - Increase Lasix to 40 mg bid today, then 40 mg daily after that (had been taking 40 mg every other day). BMET today and again in 10 days.  - Continue current Toprol XL.  - No ACEI or spironolactone for now with CKD.  She tried hydralazine but did not tolerate it due to dizziness even on a low dose. - Repeat echo.  2. Atrial fibrillation: Tolerates poorly. A-paced today.  - Continue Eliquis - Toprol XL 50 mg bid.  - Continue amiodarone for maintenance of NSR. Check TSH and LFTs today.  She will need  a regular eye exam.  She will need PFTs in 11/17.  3. CKD Stage III: BMET today.  4. Medtronic PPM for symptomatic bradycardia.  Recently replaced due to infection.  5. CAD: Possible old infarction on Cardiolite.  No ischemia.  Would not cath with significant CKD and no chest pain.  Continue statin, good lipids in 7/16.  No aspirin given Eliquis use.   Followup in 3 wks.   [name] 05/12/2016

## 2016-05-12 NOTE — Patient Instructions (Addendum)
Take Lasix 40mg  twice daily today ONLY. * Then take Lasix 40mg  daily.  Routine lab work today. Will notify you of abnormal results   Repeat labs (bmet) in 10 days.  Follow up and Echo in 3 weeks.

## 2016-05-12 NOTE — Progress Notes (Signed)
REDS VEST READING= 23 CHEST RULER=10.5  VEST FITTING TASKS: POSTURE=standing HEIGHT MARKER=short CENTER STRIP=shift  COMMENTS: Kyphosis

## 2016-05-12 NOTE — Telephone Encounter (Signed)
Late entry, d/w pt pt's daughter yesterday and she is seeing cardiology today.  We had offered PM appointment yesterday but she is going to keep the cards appointment.  App cards help.

## 2016-05-19 ENCOUNTER — Other Ambulatory Visit: Payer: Self-pay | Admitting: Family Medicine

## 2016-05-19 ENCOUNTER — Encounter: Payer: Self-pay | Admitting: Family Medicine

## 2016-05-19 DIAGNOSIS — E1121 Type 2 diabetes mellitus with diabetic nephropathy: Secondary | ICD-10-CM

## 2016-05-19 DIAGNOSIS — E1165 Type 2 diabetes mellitus with hyperglycemia: Principal | ICD-10-CM

## 2016-05-19 NOTE — Telephone Encounter (Signed)
Sent. Thanks.   

## 2016-05-21 ENCOUNTER — Other Ambulatory Visit (HOSPITAL_COMMUNITY): Payer: Medicare Other

## 2016-05-24 ENCOUNTER — Other Ambulatory Visit: Payer: Medicare Other

## 2016-05-25 ENCOUNTER — Other Ambulatory Visit: Payer: Medicare Other

## 2016-05-25 ENCOUNTER — Other Ambulatory Visit (INDEPENDENT_AMBULATORY_CARE_PROVIDER_SITE_OTHER): Payer: Medicare Other

## 2016-05-25 DIAGNOSIS — E1121 Type 2 diabetes mellitus with diabetic nephropathy: Secondary | ICD-10-CM | POA: Diagnosis not present

## 2016-05-25 DIAGNOSIS — E1165 Type 2 diabetes mellitus with hyperglycemia: Secondary | ICD-10-CM

## 2016-05-25 LAB — BASIC METABOLIC PANEL
BUN: 34 mg/dL — ABNORMAL HIGH (ref 6–23)
CHLORIDE: 99 meq/L (ref 96–112)
CO2: 28 mEq/L (ref 19–32)
Calcium: 9.3 mg/dL (ref 8.4–10.5)
Creatinine, Ser: 1.87 mg/dL — ABNORMAL HIGH (ref 0.40–1.20)
GFR: 27.37 mL/min — ABNORMAL LOW (ref >60.00)
Glucose, Bld: 259 mg/dL — ABNORMAL HIGH (ref 70–99)
POTASSIUM: 5.2 meq/L — AB (ref 3.5–5.1)
Sodium: 136 mEq/L (ref 135–145)

## 2016-05-26 ENCOUNTER — Ambulatory Visit (INDEPENDENT_AMBULATORY_CARE_PROVIDER_SITE_OTHER)
Admission: RE | Admit: 2016-05-26 | Discharge: 2016-05-26 | Disposition: A | Discharge status: Home / Self Care | Payer: Medicare Other | Source: Ambulatory Visit | Attending: Family Medicine | Admitting: Family Medicine

## 2016-05-26 ENCOUNTER — Ambulatory Visit (INDEPENDENT_AMBULATORY_CARE_PROVIDER_SITE_OTHER): Payer: Medicare Other | Admitting: Family Medicine

## 2016-05-26 ENCOUNTER — Encounter: Payer: Self-pay | Admitting: Family Medicine

## 2016-05-26 VITALS — BP 118/74 | HR 76 | Temp 98.5°F | Wt 179.0 lb

## 2016-05-26 DIAGNOSIS — M25562 Pain in left knee: Secondary | ICD-10-CM | POA: Diagnosis not present

## 2016-05-26 DIAGNOSIS — I5022 Chronic systolic (congestive) heart failure: Secondary | ICD-10-CM

## 2016-05-26 DIAGNOSIS — Z950 Presence of cardiac pacemaker: Secondary | ICD-10-CM

## 2016-05-26 DIAGNOSIS — R748 Abnormal levels of other serum enzymes: Secondary | ICD-10-CM

## 2016-05-26 DIAGNOSIS — R7989 Other specified abnormal findings of blood chemistry: Secondary | ICD-10-CM

## 2016-05-26 DIAGNOSIS — S8992XA Unspecified injury of left lower leg, initial encounter: Secondary | ICD-10-CM | POA: Diagnosis not present

## 2016-05-26 NOTE — Progress Notes (Signed)
Pre visit review using our clinic review tool, if applicable. No additional management support is needed unless otherwise documented below in the visit note. 

## 2016-05-26 NOTE — Progress Notes (Signed)
Her weight isn't back up to recent maximum.  Usually lower 170s at home, in the AM. bmet d/w pt.  Cr 1.87.  She has had notably higher Cr values prev.  Cr ~1.9 is likely a reasonable goal. Back on lasix daily in the meantime.    Fell mult times recently.  She has fall button.  D/w pt about using walker. Daughter asked about a lift chair, discussed that usually patients get weaker with a lift chair, I would prefer her not to use that but rely on walker, etc and try to keep her legs as strong as possible.    L knee pain after fall.  Some better today but still sore.  L knee puffy but not bruised.  ttp medially, anteriorly, and laterally.  Not ttp on posterior side.    Pacer was prev moved.  Now with some material exposed at the surgical site on the R upper chest wall.    Meds, vitals, and allergies reviewed.   ROS: Per HPI unless specifically indicated in ROS section   nad ncat Neck supple R upper chest wall with scar healing well except for a small amount of suture exposed in the scar.  This was cleaned with etoh, trimmed and cleaned with etoh again.  At that point, no exposed suture visibile. Tolerated well.  rrr ctab abd soft Ext with trace BLE edema.  L knee with crepitus and slightly puffy but not red or bruised. Is ttp medially, anteriorly, and laterally.  Not ttp on posterior side.  No locking.  I didn't get her on table for meniscus exam due to fall risk and lack of change in mgmt.  D/w pt. She agrees.

## 2016-05-26 NOTE — Patient Instructions (Addendum)
Recheck labs in about 1 week.   Ice 5 minutes on, 5 minutes off.  Tylenol for pain.  Use the brace as tolerated.  Go to the lab on the way out.  We'll contact you with your xray report. Take care.  Glad to see you.

## 2016-05-27 NOTE — Assessment & Plan Note (Signed)
Check xray since patella ttp but no overt quad failure or testing.  See notes on imaging.  Fall cautions d/w pt, walker, fall button, etc.

## 2016-05-27 NOTE — Assessment & Plan Note (Signed)
Doesn't appear infected, suture trimmed, f/u prn.

## 2016-05-27 NOTE — Assessment & Plan Note (Signed)
Continue daily lasix for now. bmet d/w pt.  Cr 1.87.  She has had notably higher Cr values prev.  Cr ~1.9 is likely a reasonable goal. Recheck in about 1 week.  At this point, okay for outpatient f/u. >25 minutes spent in face to face time with patient, >50% spent in counselling or coordination of care .

## 2016-05-28 ENCOUNTER — Encounter: Payer: Self-pay | Admitting: Family Medicine

## 2016-06-03 ENCOUNTER — Encounter (HOSPITAL_COMMUNITY): Payer: Self-pay

## 2016-06-03 ENCOUNTER — Telehealth (HOSPITAL_COMMUNITY): Payer: Self-pay | Admitting: Vascular Surgery

## 2016-06-03 ENCOUNTER — Ambulatory Visit (HOSPITAL_COMMUNITY)
Admission: RE | Admit: 2016-06-03 | Discharge: 2016-06-03 | Disposition: A | Discharge status: Home / Self Care | Payer: Medicare Other | Source: Ambulatory Visit | Attending: Family Medicine | Admitting: Family Medicine

## 2016-06-03 ENCOUNTER — Ambulatory Visit (HOSPITAL_BASED_OUTPATIENT_CLINIC_OR_DEPARTMENT_OTHER)
Admission: RE | Admit: 2016-06-03 | Discharge: 2016-06-03 | Disposition: A | Discharge status: Home / Self Care | Payer: Medicare Other | Source: Ambulatory Visit | Attending: Cardiology | Admitting: Cardiology

## 2016-06-03 VITALS — Resp 20 | Wt 180.2 lb

## 2016-06-03 DIAGNOSIS — Z79899 Other long term (current) drug therapy: Secondary | ICD-10-CM | POA: Insufficient documentation

## 2016-06-03 DIAGNOSIS — I2510 Atherosclerotic heart disease of native coronary artery without angina pectoris: Secondary | ICD-10-CM | POA: Diagnosis not present

## 2016-06-03 DIAGNOSIS — R001 Bradycardia, unspecified: Secondary | ICD-10-CM | POA: Diagnosis not present

## 2016-06-03 DIAGNOSIS — N183 Chronic kidney disease, stage 3 unspecified: Secondary | ICD-10-CM

## 2016-06-03 DIAGNOSIS — I130 Hypertensive heart and chronic kidney disease with heart failure and stage 1 through stage 4 chronic kidney disease, or unspecified chronic kidney disease: Secondary | ICD-10-CM | POA: Insufficient documentation

## 2016-06-03 DIAGNOSIS — Z7901 Long term (current) use of anticoagulants: Secondary | ICD-10-CM | POA: Insufficient documentation

## 2016-06-03 DIAGNOSIS — R5383 Other fatigue: Secondary | ICD-10-CM | POA: Diagnosis not present

## 2016-06-03 DIAGNOSIS — I350 Nonrheumatic aortic (valve) stenosis: Secondary | ICD-10-CM | POA: Diagnosis not present

## 2016-06-03 DIAGNOSIS — E785 Hyperlipidemia, unspecified: Secondary | ICD-10-CM | POA: Diagnosis not present

## 2016-06-03 DIAGNOSIS — I480 Paroxysmal atrial fibrillation: Secondary | ICD-10-CM | POA: Insufficient documentation

## 2016-06-03 DIAGNOSIS — I10 Essential (primary) hypertension: Secondary | ICD-10-CM

## 2016-06-03 DIAGNOSIS — M109 Gout, unspecified: Secondary | ICD-10-CM | POA: Diagnosis not present

## 2016-06-03 DIAGNOSIS — Z7984 Long term (current) use of oral hypoglycemic drugs: Secondary | ICD-10-CM | POA: Diagnosis not present

## 2016-06-03 DIAGNOSIS — Z950 Presence of cardiac pacemaker: Secondary | ICD-10-CM | POA: Diagnosis not present

## 2016-06-03 DIAGNOSIS — I5022 Chronic systolic (congestive) heart failure: Secondary | ICD-10-CM | POA: Diagnosis not present

## 2016-06-03 DIAGNOSIS — IMO0001 Reserved for inherently not codable concepts without codable children: Secondary | ICD-10-CM

## 2016-06-03 DIAGNOSIS — I998 Other disorder of circulatory system: Secondary | ICD-10-CM

## 2016-06-03 DIAGNOSIS — J449 Chronic obstructive pulmonary disease, unspecified: Secondary | ICD-10-CM

## 2016-06-03 DIAGNOSIS — I5042 Chronic combined systolic (congestive) and diastolic (congestive) heart failure: Secondary | ICD-10-CM | POA: Insufficient documentation

## 2016-06-03 DIAGNOSIS — R0989 Other specified symptoms and signs involving the circulatory and respiratory systems: Secondary | ICD-10-CM

## 2016-06-03 DIAGNOSIS — E1122 Type 2 diabetes mellitus with diabetic chronic kidney disease: Secondary | ICD-10-CM | POA: Insufficient documentation

## 2016-06-03 LAB — BASIC METABOLIC PANEL
Anion gap: 11 (ref 5–15)
BUN: 32 mg/dL — ABNORMAL HIGH (ref 6–20)
CHLORIDE: 102 mmol/L (ref 101–111)
CO2: 24 mmol/L (ref 22–32)
Calcium: 9.1 mg/dL (ref 8.9–10.3)
Creatinine, Ser: 2.13 mg/dL — ABNORMAL HIGH (ref 0.44–1.00)
GFR, EST AFRICAN AMERICAN: 24 mL/min — AB (ref >60)
GFR, EST NON AFRICAN AMERICAN: 20 mL/min — AB (ref >60)
Glucose, Bld: 184 mg/dL — ABNORMAL HIGH (ref 65–99)
POTASSIUM: 4.1 mmol/L (ref 3.5–5.1)
SODIUM: 137 mmol/L (ref 135–145)

## 2016-06-03 LAB — CBC
HCT: 35.8 % — ABNORMAL LOW (ref 36.0–46.0)
HEMOGLOBIN: 10.8 g/dL — AB (ref 12.0–15.0)
MCH: 27.3 pg (ref 26.0–34.0)
MCHC: 30.2 g/dL (ref 30.0–36.0)
MCV: 90.4 fL (ref 78.0–100.0)
Platelets: 154 10*3/uL (ref 150–400)
RBC: 3.96 MIL/uL (ref 3.87–5.11)
RDW: 15.6 % — ABNORMAL HIGH (ref 11.5–15.5)
WBC: 7 10*3/uL (ref 4.0–10.5)

## 2016-06-03 LAB — BRAIN NATRIURETIC PEPTIDE: B Natriuretic Peptide: 162.6 pg/mL — ABNORMAL HIGH (ref 0.0–100.0)

## 2016-06-03 NOTE — Progress Notes (Signed)
Advanced Heart Failure Medication Review by a Pharmacist  Does the patient  feel that his/her medications are working for him/her?  yes  Has the patient been experiencing any side effects to the medications prescribed?  no  Does the patient measure his/her own blood pressure or blood glucose at home?  yes   Does the patient have any problems obtaining medications due to transportation or finances?   no  Understanding of regimen: good Understanding of indications: good Potential of compliance: good Patient understands to avoid NSAIDs. Patient understands to avoid decongestants.  Issues to address at subsequent visits: None   Pharmacist comments:  [name] is a pleasant 82 yo F presenting with her daughter but without a medication list. She reports good compliance with her regimen and did not have any specific medication-related questions or concerns for me at this time.   [name]. Bonnye Fava, PharmD, BCPS, CPP Clinical Pharmacist Pager: [phone] Phone: [phone] 06/03/2016 3:16 PM      Time with patient: 10 minutes Preparation and documentation time: 2 minutes Total time: 12 minutes

## 2016-06-03 NOTE — Patient Instructions (Signed)
Routine lab work today. Will notify you of abnormal results, otherwise no news is good news!  Will schedule you for carotid dopplers at [facility] office. Address: [address], [place], [place] [address]  Phone: [phone]  Follow up 4 weeks.  Do the following things EVERYDAY: 1) Weigh yourself in the morning before breakfast. Write it down and keep it in a log. 2) Take your medicines as prescribed 3) Eat low salt foods-Limit salt (sodium) to 2000 mg per day.  4) Stay as active as you can everyday 5) Limit all fluids for the day to less than 2 liters

## 2016-06-03 NOTE — Progress Notes (Signed)
Echocardiogram 2D Echocardiogram has been performed.  Marisue Humble 06/03/2016, 2:56 PM

## 2016-06-03 NOTE — Telephone Encounter (Signed)
Sent Danielle Benitez message to call pt to schedule carotid doppler

## 2016-06-03 NOTE — Progress Notes (Signed)
Patient ID: Danielle Benitez, female   DOB: 10-03-1932, 82 y.o.   MRN: [id number]     Advanced Heart Failure Clinic Note   PCP: Dr. Para March HF Cardiology: Dr. Shirlee Latch  82 yo with history of HTN, orthostatic hypotension, symptomatic bradycardia with Medtronic PPM, paroxysmal atrial fibrillation chronic systolic CHF, CKD stage III, and iron deficiency anemia presents for cardiology followup. Around the beginning of 9/16, she began to develop exertional dyspnea. She was seen in the cardiology office on 07/25/15. She was in atrial fibrillation with RVR and echo showed EF 35-40% (had been 50-55% in past). PPM interrogation showed that atrial fibrillation had begun 07/11/15, so exertional dyspnea had preceded this by about 4 weeks. She was admitted from 10/7-10/17. She was anemic with hemoglobin 8.7. She was heme negative but Fe deficient. EGD was not done. She had TEE-guided DCCV to NSR. She also had AKI with creatinine up to 3.41, but it trended back down. She was sent home in NSR. She was readmitted 10/19-10/23 with recurrent atrial fibrillation and acute on chronic systolic CHF. She was diuresed about 10 lbs. It was decided to rate control her rather than repeat cardioversion, and she was sent home. she was only at home for about a day and developed increased dyspnea so came back to the ER and was re-admitted.  She was diuresed with IV Lasix and started on amiodarone.  She underwent DCCV on 10/26 but atrial fibrillation recurred on 10/29.  More amiodarone was loaded, and she went back into NSR.   Lexiscan Cardiolite in 11/16 showed basal inferior/inferolatearl fixed defect consistent with infarction but no ischemia.    Repeat echo was done 1/17.  This showed EF 45% with normal RV size and mildly decreased systolic function.   She was admitted in 7/17 with suspected infection of her device.  She had her PPM extracted and got a temporary-permanent device with later Medtronic PPM replacement.   She  presents today for regular follow up. Weight stable from last visit. Weight at home 178-180.  She had several days of weights between 167 and 170, which seem like anomalies and not true readings. Has been wheezing more and SOB walking from living room to the bathroom.  SOB bathing.   SBPs at home running in 120-140s prior to taking meds, in left arm.  Does have dizziness with rapid standing but this is not marked or limiting, no worse than usual.  She is not very active. Mostly just walks around the house.   ECG: Atrial paced rhythm 71 bpm with prolonged AV conduction with occasional PVCs  Labs (7/16): LDL 48, HDL 55 Labs (10/16): K 3.9, creatinine 2.08 Labs (11/16): K 4.9, creatinine 2.5, LFTs normal, TSH normal, HCT 43.6 Labs (12/16): K 5.2, creatinine 1.73, HCT 41.9, plts 123 Labs (3/17): LFTs normal Labs (7/17): creatinine 1.87 Labs (05/12/16) K 5.0, Creatinine 1.52 Labs (05/25/16) K 5.2, Creatinine 1.87  PMH: 1. HTN but also with history of orthostatic hypotension. 2. CKD stage III 3. Aortic stenosis: Mild 4. Symptomatic bradycardia s/p Medtronic PPM. - PPM infection 7/17 with device replacement.  5. Hyperlipidemia 6. Atrial fibrillation: Paroxysmal. TEE-guided DCCV in 10/16. She had DCCV again latera in 10/16 and was started on amiodarone.   - PFTs (11/16) with normal spirometry, severe diffusion defect c/w pulmonary vascular disease.  7. Type II diabetes 8. Gout 9. Fe-deficiency anemia 10. Chronic systolic CHF: Echo 11/14 with EF 50-55%. Echo (10/16) with EF 35-40%, moderate LV dilation, diffuse hypokinesis, mild AS, moderate  MR, PASP 48 mmHg.  Lexiscan Cardiolite (11/16) with EF 31%, basal inferior and inferolateral fixed defect possibly consistent with infarction, no ischemia; degree of LV hypokinesis was out of proportion to the perfusion defect.  Echo (1/17) with EF 45%, normal RV size with mildly decreased systolic function, aortic sclerosis without stenosis.  11. Esophageal  dysmotility  SH: Lives alone but daughter is nearby.  Non-smoker. No ETOH.    FH: No premature CAD.   ROS: All systems reviewed and negative except as per HPI.    Current Outpatient Prescriptions  Medication Sig Dispense Refill  . acetaminophen (TYLENOL) 325 MG tablet Take 1-2 tablets (325-650 mg total) by mouth 3 (three) times daily as needed.    Marland Kitchen. albuterol (PROVENTIL HFA;VENTOLIN HFA) 108 (90 BASE) MCG/ACT inhaler Inhale 2 puffs into the lungs every 6 (six) hours as needed for wheezing or shortness of breath. 1 Inhaler 0  . allopurinol (ZYLOPRIM) 300 MG tablet Take 1 tablet (300 mg total) by mouth daily. 90 tablet 3  . amiodarone (PACERONE) 200 MG tablet Take 1 tablet (200 mg total) by mouth daily. 90 tablet 3  . apixaban (ELIQUIS) 2.5 MG TABS tablet Take 1 tablet (2.5 mg total) by mouth 2 (two) times daily. 60 tablet 11  . atorvastatin (LIPITOR) 20 MG tablet Take 1 tablet (20 mg total) by mouth daily at 6 PM. 30 tablet 6  . cetirizine (ZYRTEC) 10 MG tablet Take 10 mg by mouth daily.    . DULoxetine (CYMBALTA) 20 MG capsule Take 1 capsule (20 mg total) by mouth at bedtime. Take 1 tablet by mouth daily to help treat anxiety and nerves. 90 capsule 3  . fluticasone (FLONASE) 50 MCG/ACT nasal spray Place 1 spray into both nostrils daily as needed for allergies or rhinitis.    . furosemide (LASIX) 40 MG tablet Take 1 tablet (40 mg total) by mouth daily. 30 tablet 6  . glimepiride (AMARYL) 1 MG tablet Take 1 tablet (1 mg total) by mouth daily with breakfast. 90 tablet 3  . isosorbide mononitrate (IMDUR) 30 MG 24 hr tablet Take 1 tablet (30 mg total) by mouth daily. 30 tablet 6  . metoprolol succinate (TOPROL-XL) 50 MG 24 hr tablet TAKE 1 TABLET BY MOUTH TWICE A DAY 60 tablet 6  . ONE TOUCH ULTRA TEST test strip     . ONGLYZA 2.5 MG TABS tablet TAKE 1 TABLET BY MOUTH DAILY 30 tablet 5  . pantoprazole (PROTONIX) 40 MG tablet Take 1 tablet (40 mg total) by mouth daily. 30 tablet 3  .  senna-docusate (SENOKOT-S) 8.6-50 MG tablet Take 1 tablet by mouth daily as needed for mild constipation.    . Simethicone (GAS-X MAXIMUM STRENGTH PO) Take 1 tablet by mouth daily.     . traZODone (DESYREL) 50 MG tablet Take 0.5-1 tablets (25-50 mg total) by mouth at bedtime as needed for sleep. Don't take >50mg  in a day. 30 tablet 3  . ondansetron (ZOFRAN ODT) 4 MG disintegrating tablet Take 1 tablet (4 mg total) by mouth every 8 (eight) hours as needed for nausea or vomiting. (Patient not taking: Reported on 06/03/2016) 20 tablet 0   No current facility-administered medications for this encounter.    Resp 20   Wt 180 lb 4 oz (81.8 kg)   SpO2 96%   BMI 35.20 kg/m     BP 85/55 sitting (Right) BP 62/40 standing (Right) BP 112/80 sitting (Left)   Wt Readings from Last 3 Encounters:  06/03/16 180 lb  4 oz (81.8 kg)  05/26/16 179 lb (81.2 kg)  05/12/16 180 lb 8 oz (81.9 kg)     General: NAD Neck: JVP 7-8 cm, no thyromegaly or thyroid nodule.  Lungs: CTAB, normal effort CV: Nondisplaced PMI.  Heart regular S1/S2, no S3/S4, no murmur.  Trace ankle edema at most.  No carotid bruit.  Normal pedal pulses.  Abdomen: Soft, NT, ND, no HSM. No bruits or masses. +BS  Skin: Intact without lesions or rashes.  Neurologic: Alert and oriented x 3.  Psych: Normal affect. Extremities: No clubbing or cyanosis.  HEENT: Normal.   Assessment/Plan: 1. Chronic combined systolic and diastolic CHF: EF 47-09% by echo 10/16. Etiology uncertain: could be tachy-mediated cardiomyopathy, but symptoms seemed to predate the onset of atrial fibrillation. Possible viral myocarditis. Cannot rule out ischemic cardiomyopathy, but no definite anginal symptoms. Cardiolite in 11/16 showed a small area possibly of prior infarction but EF and wall motion abnormalities were out of proportion to the perfusion defect. By 1/17, EF was improved to 45%.   - NYHA class III symptoms today, but mostly limited from  fatigue/deconditioning.    - Continue lasix 40 mg daily for now.  BMET and BNP today. Think she may be on dryer side.  - Continue Toprol XL 50 mg BID.  - No ACEI or spironolactone for now with CKD.  She tried hydralazine but did not tolerate it due to dizziness even on a low dose. - Echo today with EF 55-60%, normal RV. + Diastolic dysfunction.  2. Atrial fibrillation: Tolerates poorly. -  A-paced rhythm today.  - Continue Eliquis. CBC today.  - Toprol XL 50 mg bid.  - Continue amiodarone 200 mg daily. TSH and LFTs normal 05/12/16.  She will need a regular eye exam.  She will need PFTs in 11/17.  3. CKD Stage III: BMET today.  4. Medtronic PPM for symptomatic bradycardia.  Recently replaced due to infection.  5. CAD: Possible old infarction on Cardiolite.  No ischemia.   - No CP. No cath Would not cath with significant CKD and no chest pain.  Continue statin, good lipids in 7/16.  No aspirin given Eliquis use.  6. Fatigue - Think this mostly sounds like deconditioning from recent hospitalization. Recommend she increase activity as able and join exercise program such as Silver sneaker.  Insurance has said they wouldn't cover cardiac rehab in the past.  7. Unequal blood pressures in upper extremities.  - Was initially though to be orthostatic as above but with no more dizziness than usual. On checking Left arm, realized there was a discrepancy of > 30 points between arms.  Will order [place] to look for Subclavian stenosis.  She is otherwise asymptomatic.  EF improved to 55-60%. Has diastolic dysfunction. BMET/BNP today. With fatigue will check CBC as well.  With BP difference > 30 pts between arms, will order [place] to look for Subclavian stenosis. No med changes for now.  Discussed case with Dr. Shirlee Latch who agreed with findings and recommendations.   Graciella Freer, PA-C 06/03/2016   Total time spent > 25 minutes. Over half that spent discussing the above.

## 2016-06-04 ENCOUNTER — Telehealth (HOSPITAL_COMMUNITY): Payer: Self-pay | Admitting: *Deleted

## 2016-06-04 NOTE — Telephone Encounter (Signed)
Notes Recorded by Modesta Messing, CMA on 06/04/2016 at 2:00 PM EDT Patient aware. Spoke with patients daughter she is aware of results and med change. Pt will have labs drawn in 10 days at [facility]. ------  Notes Recorded by Graciella Freer, PA-C on 06/03/2016 at 7:48 PM EDT Creatinine and BUN continue to trend up. Hold lasix x 2 days then resume at 40 mg EVERY OTHER DAY. Can take an extra 40 as needed. Recheck BMET 10 days.   BNP baseline. CBC normal.  B[place] Crown" [place], [place]  06/03/16    Ref Range & Units 1d ago 10d ago 3wk ago   Sodium 135 - 145 mmol/L 137 136R 137   Potassium 3.5 - 5.1 mmol/L 4.1 5.2R  5.0   Chloride 101 - 111 mmol/L 102 99R 106   CO2 22 - 32 mmol/L 24 28R 24   Glucose, Bld 65 - 99 mg/dL 765  465K  354    BUN 6 - 20 mg/dL 32  65K  26    Creatinine, Ser 0.44 - 1.00 mg/dL 8.12  7.51Z  0.01    Calcium 8.9 - 10.3 mg/dL 9.1 7.4B 9.1   GFR calc non Af Amer >60 mL/min 20   31    GFR calc Af Amer >60 mL/min 24   36CM

## 2016-06-13 ENCOUNTER — Emergency Department (HOSPITAL_COMMUNITY): Payer: Medicare Other

## 2016-06-13 ENCOUNTER — Encounter (HOSPITAL_COMMUNITY): Payer: Self-pay

## 2016-06-13 ENCOUNTER — Emergency Department (HOSPITAL_COMMUNITY)
Admission: EM | Admit: 2016-06-13 | Discharge: 2016-06-13 | Disposition: A | Discharge status: Home / Self Care | Payer: Medicare Other | Attending: Emergency Medicine | Admitting: Emergency Medicine

## 2016-06-13 ENCOUNTER — Telehealth: Payer: Self-pay | Admitting: Physician Assistant

## 2016-06-13 DIAGNOSIS — I130 Hypertensive heart and chronic kidney disease with heart failure and stage 1 through stage 4 chronic kidney disease, or unspecified chronic kidney disease: Secondary | ICD-10-CM | POA: Insufficient documentation

## 2016-06-13 DIAGNOSIS — I252 Old myocardial infarction: Secondary | ICD-10-CM | POA: Insufficient documentation

## 2016-06-13 DIAGNOSIS — E1122 Type 2 diabetes mellitus with diabetic chronic kidney disease: Secondary | ICD-10-CM | POA: Insufficient documentation

## 2016-06-13 DIAGNOSIS — Z79899 Other long term (current) drug therapy: Secondary | ICD-10-CM | POA: Diagnosis not present

## 2016-06-13 DIAGNOSIS — Z7901 Long term (current) use of anticoagulants: Secondary | ICD-10-CM | POA: Diagnosis not present

## 2016-06-13 DIAGNOSIS — I2510 Atherosclerotic heart disease of native coronary artery without angina pectoris: Secondary | ICD-10-CM | POA: Insufficient documentation

## 2016-06-13 DIAGNOSIS — Z7984 Long term (current) use of oral hypoglycemic drugs: Secondary | ICD-10-CM | POA: Diagnosis not present

## 2016-06-13 DIAGNOSIS — Z950 Presence of cardiac pacemaker: Secondary | ICD-10-CM | POA: Insufficient documentation

## 2016-06-13 DIAGNOSIS — I5022 Chronic systolic (congestive) heart failure: Secondary | ICD-10-CM | POA: Insufficient documentation

## 2016-06-13 DIAGNOSIS — N183 Chronic kidney disease, stage 3 (moderate): Secondary | ICD-10-CM | POA: Insufficient documentation

## 2016-06-13 DIAGNOSIS — R0600 Dyspnea, unspecified: Secondary | ICD-10-CM | POA: Insufficient documentation

## 2016-06-13 DIAGNOSIS — R0602 Shortness of breath: Secondary | ICD-10-CM | POA: Diagnosis not present

## 2016-06-13 LAB — BASIC METABOLIC PANEL
Anion gap: 5 (ref 5–15)
BUN: 22 mg/dL — AB (ref 6–20)
CHLORIDE: 105 mmol/L (ref 101–111)
CO2: 26 mmol/L (ref 22–32)
CREATININE: 1.88 mg/dL — AB (ref 0.44–1.00)
Calcium: 9.3 mg/dL (ref 8.9–10.3)
GFR, EST AFRICAN AMERICAN: 28 mL/min — AB (ref >60)
GFR, EST NON AFRICAN AMERICAN: 24 mL/min — AB (ref >60)
Glucose, Bld: 284 mg/dL — ABNORMAL HIGH (ref 65–99)
POTASSIUM: 5.1 mmol/L (ref 3.5–5.1)
SODIUM: 136 mmol/L (ref 135–145)

## 2016-06-13 LAB — CBC
HCT: 32.6 % — ABNORMAL LOW (ref 36.0–46.0)
Hemoglobin: 9.7 g/dL — ABNORMAL LOW (ref 12.0–15.0)
MCH: 27.2 pg (ref 26.0–34.0)
MCHC: 29.8 g/dL — ABNORMAL LOW (ref 30.0–36.0)
MCV: 91.3 fL (ref 78.0–100.0)
PLATELETS: 145 10*3/uL — AB (ref 150–400)
RBC: 3.57 MIL/uL — AB (ref 3.87–5.11)
RDW: 15.9 % — AB (ref 11.5–15.5)
WBC: 5.8 10*3/uL (ref 4.0–10.5)

## 2016-06-13 LAB — BRAIN NATRIURETIC PEPTIDE: B NATRIURETIC PEPTIDE 5: 137.2 pg/mL — AB (ref 0.0–100.0)

## 2016-06-13 NOTE — Telephone Encounter (Signed)
Rec'd page from [place] at [facility] [phone]. Tried calling number but each extension I press says the office is closed and to leave a message. I left a message but subsequently called patient to find out what was going on. Weight is up 5lbs overnight, feeling SOB, swelling, she said nurse was fairly concerned. Recent issues with progressive renal insufficiency requiring scaling back of diuretics. I am concerned about outpatient mgmt as offices do not re-open again until Tuesday to be able to obtain OP labs/assessment. Given her complex PMH I have recommended she proceed to ER so she can be evaluated with updated labs to determine next plan of action. Pt is in agreement of this plan and will have her family drive her. Dayna Swayne PA-C

## 2016-06-13 NOTE — Discharge Instructions (Signed)
Take your lasix, 40 mg by mouth daily.  Call the Cardiology office on Tuesday for additional dose changes.  Get rechecked immediately if you develop fevers, chest pain, worsening swelling, or new concerning symptoms.

## 2016-06-13 NOTE — ED Triage Notes (Signed)
Patient complains of increasing shortness of breath and 5.2lb weight gain x 1 day. Has CHF and due to kidney function had to reduce lasix prescription to every other day. Did take her lasix 40mg  this am. Denies cp, speaking complete sentences. alert and oriented

## 2016-06-13 NOTE — ED Notes (Signed)
Ambulated pt. To the bathroom, gait unsteady, pt. Required assistance of 2.   Pt. s daughter reports that pt. 's is wobbly on her feet and she uses a walker at home.  Pt. Denies any dizziness , sob or chest pain

## 2016-06-13 NOTE — ED Provider Notes (Signed)
[facility]-EMERGENCY DEPT Provider Note   CSN: [id number] Arrival date & time: 06/13/16  1140     History   Chief Complaint Chief Complaint  Patient presents with  . Shortness of Breath    HPI Danielle Benitez is a 82 y.o. female.  The history is provided by the patient. No language interpreter was used.  Shortness of Breath     Danielle Benitez is a 82 y.o. female who presents to the Emergency Department complaining of SOB.  She reports 3 days of progressive shortness of breath, dyspnea on exertion, orthopnea. She has a nonproductive cough. She endorses 5 pound fluid gain since yesterday. She typically gains fluid about her face. No fevers or chest pain. She has a history of CHF as well as kidney disease. She had to decrease her Lasix to every other day because of kidney problems one week ago. Yesterday she barely urinated at all. Today she took a Lasix and only urinated once.  Past Medical History:  Diagnosis Date  . Abnormality of gait   . Anemia, unspecified   . Anticoagulated- Eliquis 07/31/2015  . Anxiety state, unspecified   . Aortic stenosis    a. Mild by echo 08/2013.  Marland Kitchen Atrial fibrillation - developed 07/11/15 by PPM interrogation, recurrent after DCCV 10/14 07/11/2015    recurrent after DCCV 10/14  . Bifascicular block    afib  . Chronic systolic CHF (congestive heart failure) (HCC)    a. Dx 07/2015 - EF 35-40%, unclear etiology  . CKD (chronic kidney disease), stage III   . Depressive disorder, not elsewhere classified   . DM (diabetes mellitus), type 2, uncontrolled, with renal complications (HCC) 01/08/2012  . Eczema 11/22/2013  . Esophageal dysmotility 08/05/2015  . Essential hypertension   . Female stress incontinence 05/06/2015  . Full dentures   . GERD (gastroesophageal reflux disease)   . Gout   . HOH (hard of hearing)   . Internal hemorrhoids without mention of complication   . Lumbago   . Memory loss   . Mild CAD    a. Cath 2013: 20-30% prox RCA.  Marland Kitchen Myocardial  infarction (HCC) 07/2015  . Nonspecific abnormal results of liver function study   . Obesity, unspecified   . Orthostatic hypotension   . Osteoarthritis   . Other and unspecified hyperlipidemia   . Pain in joint, shoulder region 11/22/2013   left   . Personal history of fall   . Presence of permanent cardiac pacemaker   . Pruritus 11/22/2013  . S/P cardiac pacemaker procedure, PPM Medtronic REVO placed 01/11/2012 01/11/2012   a. for symptomatic bradycardia. Followed by Dr. Ladona Ridgel.  . Shortness of breath dyspnea    with movement-  . Wears glasses     Patient Active Problem List   Diagnosis Date Noted  . Unequal blood pressure in upper extremities 06/03/2016  . Cardiac pacemaker in situ 04/12/2016  . Cough 03/08/2016  . Abnormality of gait 03/05/2016  . Risk for falls 03/05/2016  . Insomnia 01/21/2016  . Chronic systolic congestive heart failure, NYHA class 3 (HCC) 01/20/2016  . LFT elevation 11/21/2015  . Weakness 11/06/2015  . Dysphagia   . Esophageal dysmotility 08/05/2015  . Anticoagulated- Eliquis 07/31/2015  . Normochromic normocytic anemia 07/31/2015  . Atrial fibrillation - 07/11/15 by PPM interrogation, recurrent 10/19 after DCCV 10/14 07/18/2015  . Moderate mitral regurgitation 07/18/2015  . Moderate tricuspid regurgitation 07/18/2015  . History of pacemaker - Medtronic 07/18/2015  . CKD (chronic kidney disease), stage  III   . Orthostatic hypotension   . Essential hypertension   . Pericardial effusion 07/15/2015  . Female stress incontinence 05/06/2015  . Knee pain 12/18/2014  . Low back pain 02/27/2014  . Obese 02/27/2014  . Depression 02/27/2014  . Pain in joint, shoulder region 11/22/2013  . Eczema 11/22/2013  . S/P cardiac pacemaker procedure, PPM Medtronic REVO placed 01/11/2012 01/11/2012  . COPD bronchitis by CXR 01/10/2012  . Dyslipidemia 01/10/2012  . Mild CAD - 20-30% RCA in 2013 01/10/2012  . Fatigue 01/10/2012  . Sinus bradycardia, HR as low As 35  01/10/2012  . Mild aortic stenosis 01/10/2012  . SOB (shortness of breath) 01/08/2012  . DM, type 2, with renal complications  01/08/2012    Past Surgical History:  Procedure Laterality Date  . CARDIAC CATHETERIZATION  12/03/2003   Dr Clarene DukeLittle  . CARDIOVERSION N/A 07/25/2015   Procedure: CARDIOVERSION;  Surgeon: Pricilla RifflePaula Ross V, MD;  [facility];  Service: Cardiovascular;  Laterality: N/A;  . CARDIOVERSION N/A 08/06/2015   Procedure: CARDIOVERSION;  Surgeon: Laurey Moralealton S McLean, MD;  [facility];  Service: Cardiovascular;  Laterality: N/A;  . CATARACT EXTRACTION W/ INTRAOCULAR LENS  IMPLANT, BILATERAL Bilateral 03/2015-04/2015  . COLONOSCOPY     ?????  . EP IMPLANTABLE DEVICE N/A 04/15/2016   Procedure: Pacemaker Implant;  Surgeon: Marinus MawGregg W Taylor, MD;  [facility];  Service: Cardiovascular;  Laterality: N/A;  . ESOPHAGOGASTRODUODENOSCOPY N/A 08/08/2015   Procedure: ESOPHAGOGASTRODUODENOSCOPY (EGD);  Surgeon: Iva Booparl E Gessner, MD;  [facility];  Service: Endoscopy;  Laterality: N/A;  . EXTREMITY CYST EXCISION     finger  . EYE SURGERY Bilateral    catartact  . INSERT / REPLACE / REMOVE PACEMAKER    . LEFT HEART CATHETERIZATION WITH CORONARY ANGIOGRAM N/A 01/10/2012   Procedure: LEFT HEART CATHETERIZATION WITH CORONARY ANGIOGRAM;  Surgeon: Chrystie NoseKenneth C. Hilty, MD;  [facility];  Service: Cardiovascular;  Laterality: N/A;  . ORIF WRIST FRACTURE Right 04/09/2014   Procedure: OPEN REDUCTION INTERNAL FIXATION (ORIF) RIGHT DISPLACED  DISTAL RADIUS  FRACTURE;  Surgeon: Dominica SeverinWilliam Gramig, MD;  Location: [facility];  Service: Orthopedics;  Laterality: Right;  . PACEMAKER LEAD REMOVAL  04/12/2016   TEMPORARY PACEMAKER INSERTION  . PACEMAKER LEAD REMOVAL  04/15/2016   Procedure: Pacemaker Lead Removal;  Surgeon: Marinus MawGregg W Taylor, MD;  [facility];  Service: Cardiovascular;;  . PACEMAKER LEAD REMOVAL N/A 04/12/2016   Procedure:  PACEMAKER LEAD REMOVAL;  Surgeon: Marinus MawGregg W Taylor, MD;  [facility];  Service: Cardiovascular;  Laterality: N/A;  . PERMANENT PACEMAKER INSERTION Left 01/11/2012   Procedure: PERMANENT PACEMAKER INSERTION;  Surgeon: Thurmon FairMihai Croitoru, MD;  Location: [facility] C[facility];  Service: Cardiovascular;  Laterality: Left;  . TEE WITHOUT CARDIOVERSION N/A 07/25/2015   Procedure: TRANSESOPHAGEAL ECHOCARDIOGRAM (TEE);  Surgeon: Pricilla RifflePaula Ross V, MD;  [facility];  Service: Cardiovascular;  Laterality: N/A;    OB History    No data available       Home Medications    Prior to Admission medications   Medication Sig Start Date End Date Taking? Authorizing Provider  acetaminophen (TYLENOL) 325 MG tablet Take 1-2 tablets (325-650 mg total) by mouth 3 (three) times daily as needed. Patient taking differently: Take 650 mg by mouth 3 (three) times daily as needed.  04/05/16  Yes Joaquim NamGraham S Duncan, MD  allopurinol (ZYLOPRIM) 300 MG tablet Take 1 tablet (300 mg total) by mouth daily. 06/20/15  Yes  Kimber Relic, MD  amiodarone (PACERONE) 200 MG tablet Take 1 tablet (200 mg total) by mouth daily. 11/24/15  Yes Laurey Morale, MD  apixaban (ELIQUIS) 2.5 MG TABS tablet Take 1 tablet (2.5 mg total) by mouth 2 (two) times daily. 07/28/15  Yes Rhonda G Barrett, PA-C  atorvastatin (LIPITOR) 20 MG tablet Take 1 tablet (20 mg total) by mouth daily at 6 PM. 08/11/15  Yes Graciella Freer, PA-C  cetirizine (ZYRTEC) 10 MG tablet Take 10 mg by mouth daily.   Yes Historical Provider, MD  DULoxetine (CYMBALTA) 20 MG capsule Take 1 capsule (20 mg total) by mouth at bedtime. Take 1 tablet by mouth daily to help treat anxiety and nerves. 08/03/15  Yes Luke K Kilroy, PA-C  furosemide (LASIX) 40 MG tablet Take 1 tablet (40 mg total) by mouth daily. Patient taking differently: Take 40 mg by mouth every other day.  05/12/16  Yes Laurey Morale, MD  glimepiride (AMARYL) 1 MG tablet Take 1 tablet (1 mg total) by mouth daily with  breakfast. 10/15/15  Yes Joaquim Nam, MD  isosorbide mononitrate (IMDUR) 30 MG 24 hr tablet Take 1 tablet (30 mg total) by mouth daily. 02/04/16  Yes Amy D Clegg, NP  metoprolol succinate (TOPROL-XL) 50 MG 24 hr tablet TAKE 1 TABLET BY MOUTH TWICE A DAY 02/27/16  Yes Laurey Morale, MD  ONGLYZA 2.5 MG TABS tablet TAKE 1 TABLET BY MOUTH DAILY 05/19/16  Yes Joaquim Nam, MD  pantoprazole (PROTONIX) 40 MG tablet Take 1 tablet (40 mg total) by mouth daily. 01/15/16  Yes Joaquim Nam, MD  Simethicone (GAS-X MAXIMUM STRENGTH PO) Take 1 tablet by mouth daily.    Yes Historical Provider, MD  albuterol (PROVENTIL HFA;VENTOLIN HFA) 108 (90 BASE) MCG/ACT inhaler Inhale 2 puffs into the lungs every 6 (six) hours as needed for wheezing or shortness of breath. 06/19/15   Sharon Seller, NP  fluticasone (FLONASE) 50 MCG/ACT nasal spray Place 1 spray into both nostrils daily as needed for allergies or rhinitis.    Historical Provider, MD  ondansetron (ZOFRAN ODT) 4 MG disintegrating tablet Take 1 tablet (4 mg total) by mouth every 8 (eight) hours as needed for nausea or vomiting. Patient not taking: Reported on 06/03/2016 11/04/15   Doreene Nest, NP  senna-docusate (SENOKOT-S) 8.6-50 MG tablet Take 1 tablet by mouth daily as needed for mild constipation. 07/28/15   Rhonda G Barrett, PA-C  traZODone (DESYREL) 50 MG tablet Take 0.5-1 tablets (25-50 mg total) by mouth at bedtime as needed for sleep. Don't take >50mg  in a day. 01/21/16   Joaquim Nam, MD    Family History Family History  Problem Relation Age of Onset  . Breast cancer Mother   . Cancer Mother   . Diabetes Daughter   . Hypertension Daughter   . Cancer Brother     Social History Social History  Substance Use Topics  . Smoking status: Never Smoker  . Smokeless tobacco: Never Used  . Alcohol use No     Allergies   Beta adrenergic blockers and Jardiance [empagliflozin]   Review of Systems Review of Systems  Respiratory:  Positive for shortness of breath.   All other systems reviewed and are negative.    Physical Exam Updated Vital Signs BP 118/65   Pulse 71   Temp 98 F (36.7 C) (Oral)   Resp 20   Ht 5' (1.524 m)   Wt 185 lb 4 oz (84  kg)   SpO2 100%   BMI 36.18 kg/m   Physical Exam  Constitutional: She is oriented to person, place, and time. She appears well-developed and well-nourished.  HENT:  Head: Normocephalic and atraumatic.  Cardiovascular: Normal rate and regular rhythm.   No murmur heard. Pulmonary/Chest: Effort normal and breath sounds normal. No respiratory distress.  Abdominal: Soft. There is no tenderness. There is no rebound and no guarding.  Musculoskeletal: She exhibits no tenderness.  Trace edema in bilateral lower extremities  Neurological: She is alert and oriented to person, place, and time.  Skin: Skin is warm and dry.  Psychiatric: She has a normal mood and affect. Her behavior is normal.  Nursing note and vitals reviewed.    ED Treatments / Results  Labs (all labs ordered are listed, but only abnormal results are displayed) Labs Reviewed  BASIC METABOLIC PANEL - Abnormal; Notable for the following:       Result Value   Glucose, Bld 284 (*)    BUN 22 (*)    Creatinine, Ser 1.88 (*)    GFR calc non Af Amer 24 (*)    GFR calc Af Amer 28 (*)    All other components within normal limits  CBC - Abnormal; Notable for the following:    RBC 3.57 (*)    Hemoglobin 9.7 (*)    HCT 32.6 (*)    MCHC 29.8 (*)    RDW 15.9 (*)    Platelets 145 (*)    All other components within normal limits  BRAIN NATRIURETIC PEPTIDE - Abnormal; Notable for the following:    B Natriuretic Peptide 137.2 (*)    All other components within normal limits  I-STAT TROPOININ, ED    EKG  EKG Interpretation  Date/Time:  Sunday June 13 2016 11:48:02 EDT Ventricular Rate:  70 PR Interval:  252 QRS Duration: 164 QT Interval:  472 QTC Calculation: 509 R Axis:   -80 Text  Interpretation:  Atrial-paced rhythm with prolonged AV conduction Right bundle branch block Left anterior fascicular block Bifascicular block  Septal infarct , age undetermined Abnormal ECG No significant change since last tracing Confirmed by LITTLE MD, RACHEL [phone]) on 06/13/2016 11:52:59 AM Also confirmed by LITTLE MD, RACHEL [phone]), editor Spivey, Cala Bradford [phone])  on 06/13/2016 11:55:25 AM       Radiology Dg Chest 2 View  Result Date: 06/13/2016 CLINICAL DATA:  82 year old female with a history of shortness of breath EXAM: CHEST  2 VIEW COMPARISON:  04/16/2016, 04/13/2016, 11/13/2015 FINDINGS: Cardiomediastinal silhouette unchanged. No evidence of central vascular congestion. Low lung volumes accentuates the interstitium. No pleural effusion pneumothorax.  No confluent airspace disease. Cardiac pacing device on the right chest wall with 2 leads in place, unchanged. No displaced fracture. Degenerative changes of the shoulders and the spine. IMPRESSION: Low lung volumes without evidence of superimposed acute cardiopulmonary disease. Unchanged cardiac pacing device. Signed, Yvone Neu. Loreta Ave, DO [facility] Radiology Electronically Signed   By: Gilmer Mor D.O.   On: 06/13/2016 12:48    Procedures Procedures (including critical care time)  Medications Ordered in ED Medications - No data to display   Initial Impression / Assessment and Plan / ED Course  I have reviewed the triage vital signs and the nursing notes.  Pertinent labs & imaging results that were available during my care of the patient were reviewed by me and considered in my medical decision making (see chart for details).  Clinical Course  Patient with history of congestive heart failure and chronic renal insufficiency here with weight gain and shortness of breath after decreasing her Lasix dose. Labs with improving in BNP and renal function. She is not significantly volume  overloaded on examination. No respiratory distress or hypoxia and the department. Plan to resume her Lasix at 40 mg daily with close cardiology follow-up. Presentation is not consistent with ACS, PE, pneumonia. Patient and family are in agreement with plan.   Final Clinical Impressions(s) / ED Diagnoses   Final diagnoses:  Dyspnea    New Prescriptions Discharge Medication List as of 06/13/2016  2:42 PM       Tilden Fossa, MD 06/13/16 [phone]

## 2016-06-18 ENCOUNTER — Other Ambulatory Visit (HOSPITAL_COMMUNITY): Payer: Self-pay | Admitting: Student

## 2016-06-18 ENCOUNTER — Ambulatory Visit (HOSPITAL_COMMUNITY)
Admission: RE | Admit: 2016-06-18 | Discharge: 2016-06-18 | Disposition: A | Discharge status: Home / Self Care | Payer: Medicare Other | Source: Ambulatory Visit | Attending: Cardiology | Admitting: Cardiology

## 2016-06-18 DIAGNOSIS — I6523 Occlusion and stenosis of bilateral carotid arteries: Secondary | ICD-10-CM | POA: Diagnosis not present

## 2016-06-18 DIAGNOSIS — I10 Essential (primary) hypertension: Secondary | ICD-10-CM | POA: Insufficient documentation

## 2016-06-18 DIAGNOSIS — I998 Other disorder of circulatory system: Secondary | ICD-10-CM

## 2016-06-18 DIAGNOSIS — I2510 Atherosclerotic heart disease of native coronary artery without angina pectoris: Secondary | ICD-10-CM | POA: Insufficient documentation

## 2016-06-23 ENCOUNTER — Telehealth (HOSPITAL_COMMUNITY): Payer: Self-pay | Admitting: Cardiology

## 2016-06-23 ENCOUNTER — Encounter: Payer: Self-pay | Admitting: Family Medicine

## 2016-06-23 NOTE — Telephone Encounter (Signed)
[employer] nurse called with patient update Patients weight slowly increasing (184) Mild LE edema Denies increased SOB, cough,CP etc  Patient currently taking lasix 40 mg, one tab daily  Please advised further

## 2016-06-24 ENCOUNTER — Ambulatory Visit (INDEPENDENT_AMBULATORY_CARE_PROVIDER_SITE_OTHER)
Admission: RE | Admit: 2016-06-24 | Discharge: 2016-06-24 | Disposition: A | Discharge status: Home / Self Care | Payer: Medicare Other | Source: Ambulatory Visit | Attending: Family Medicine | Admitting: Family Medicine

## 2016-06-24 ENCOUNTER — Encounter: Payer: Self-pay | Admitting: Family Medicine

## 2016-06-24 ENCOUNTER — Ambulatory Visit (INDEPENDENT_AMBULATORY_CARE_PROVIDER_SITE_OTHER): Payer: Medicare Other | Admitting: Family Medicine

## 2016-06-24 VITALS — BP 124/82 | HR 76 | Temp 97.8°F | Wt 184.2 lb

## 2016-06-24 DIAGNOSIS — S51011A Laceration without foreign body of right elbow, initial encounter: Secondary | ICD-10-CM

## 2016-06-24 DIAGNOSIS — S4992XA Unspecified injury of left shoulder and upper arm, initial encounter: Secondary | ICD-10-CM

## 2016-06-24 DIAGNOSIS — W19XXXA Unspecified fall, initial encounter: Secondary | ICD-10-CM | POA: Diagnosis not present

## 2016-06-24 DIAGNOSIS — Z23 Encounter for immunization: Secondary | ICD-10-CM | POA: Diagnosis not present

## 2016-06-24 DIAGNOSIS — N183 Chronic kidney disease, stage 3 unspecified: Secondary | ICD-10-CM

## 2016-06-24 DIAGNOSIS — M25512 Pain in left shoulder: Secondary | ICD-10-CM | POA: Diagnosis not present

## 2016-06-24 DIAGNOSIS — S51019A Laceration without foreign body of unspecified elbow, initial encounter: Secondary | ICD-10-CM | POA: Insufficient documentation

## 2016-06-24 NOTE — Progress Notes (Addendum)
BP 124/82   Pulse 76   Temp 97.8 F (36.6 C) (Oral)   Wt 184 lb 4 oz (83.6 kg)   BMI 35.98 kg/m    CC: fall today with L arm pain Subjective:    Patient ID: Danielle Benitez, female    DOB: 12/31/1931, 82 y.o.   MRN: [id number]  HPI: Danielle Benitez is a 82 y.o. female presenting on 06/24/2016 for Skin tear (fell today; right arm)   Pleasant patient of Dr Lianne Bushyuncan's presents today after fall at home with R skin tear and R arm pain. More unsteady over last few weeks. May have tripped over walker. Landed on knees and caught herself with her hands. Aching L side and L shoulder. cbg this morning 202, then 169. Appetite is down.   Denies inciting dizziness, presyncope, passing out.   Current lasix dose is 20mg  daily.  Diabetic regimen is onglyza 2.5mg , amaryl 1mg  daily.  Lab Results  Component Value Date   HGBA1C 7.9 (H) 04/12/2016     Relevant past medical, surgical, family and social history reviewed and updated as indicated. Interim medical history since our last visit reviewed. Allergies and medications reviewed and updated. Current Outpatient Prescriptions on File Prior to Visit  Medication Sig  . acetaminophen (TYLENOL) 325 MG tablet Take 1-2 tablets (325-650 mg total) by mouth 3 (three) times daily as needed. (Patient taking differently: Take 650 mg by mouth 3 (three) times daily as needed. )  . albuterol (PROVENTIL HFA;VENTOLIN HFA) 108 (90 BASE) MCG/ACT inhaler Inhale 2 puffs into the lungs every 6 (six) hours as needed for wheezing or shortness of breath.  . allopurinol (ZYLOPRIM) 300 MG tablet Take 1 tablet (300 mg total) by mouth daily.  Marland Kitchen. amiodarone (PACERONE) 200 MG tablet Take 1 tablet (200 mg total) by mouth daily.  Marland Kitchen. apixaban (ELIQUIS) 2.5 MG TABS tablet Take 1 tablet (2.5 mg total) by mouth 2 (two) times daily.  . cetirizine (ZYRTEC) 10 MG tablet Take 10 mg by mouth daily.  . DULoxetine (CYMBALTA) 20 MG capsule Take 1 capsule (20 mg total) by mouth at bedtime. Take 1 tablet  by mouth daily to help treat anxiety and nerves.  . fluticasone (FLONASE) 50 MCG/ACT nasal spray Place 1 spray into both nostrils daily as needed for allergies or rhinitis.  . furosemide (LASIX) 40 MG tablet Take 1 tablet (40 mg total) by mouth daily. (Patient taking differently: Take 40 mg by mouth every other day. )  . glimepiride (AMARYL) 1 MG tablet Take 1 tablet (1 mg total) by mouth daily with breakfast.  . isosorbide mononitrate (IMDUR) 30 MG 24 hr tablet Take 1 tablet (30 mg total) by mouth daily.  . metoprolol succinate (TOPROL-XL) 50 MG 24 hr tablet TAKE 1 TABLET BY MOUTH TWICE A DAY  . ondansetron (ZOFRAN ODT) 4 MG disintegrating tablet Take 1 tablet (4 mg total) by mouth every 8 (eight) hours as needed for nausea or vomiting.  . ONGLYZA 2.5 MG TABS tablet TAKE 1 TABLET BY MOUTH DAILY  . senna-docusate (SENOKOT-S) 8.6-50 MG tablet Take 1 tablet by mouth daily as needed for mild constipation.  . Simethicone (GAS-X MAXIMUM STRENGTH PO) Take 1 tablet by mouth daily.   . traZODone (DESYREL) 50 MG tablet Take 0.5-1 tablets (25-50 mg total) by mouth at bedtime as needed for sleep. Don't take >50mg  in a day.   No current facility-administered medications on file prior to visit.     Review of Systems Per HPI unless  specifically indicated in ROS section     Objective:    BP 124/82   Pulse 76   Temp 97.8 F (36.6 C) (Oral)   Wt 184 lb 4 oz (83.6 kg)   BMI 35.98 kg/m   Wt Readings from Last 3 Encounters:  07/01/16 185 lb 12.8 oz (84.3 kg)  06/24/16 184 lb 4 oz (83.6 kg)  06/13/16 185 lb 4 oz (84 kg)    Physical Exam  Constitutional: She appears well-developed and well-nourished. No distress.  HENT:  Mouth/Throat: Oropharynx is clear and moist. No oropharyngeal exudate.  Cardiovascular: Normal rate, regular rhythm, normal heart sounds and intact distal pulses.   No murmur heard. Pulmonary/Chest: Effort normal and breath sounds normal. No respiratory distress. She has no wheezes.  She has no rales.  Musculoskeletal: She exhibits no edema.  FROM R shoulder without significant pain Tender to palpation L shoulder anteriorly posteriorly and upper left arm, pain with rotation of humeral head in [facility] joint, limited active ROM of shoulder, improved with passive ROM.  Skin: Skin is warm and dry.  Skin tear R lateral elbow of ~3cm diameter with open skin flap exposing epithelial layer. No surrounding erythema. Wound irrigated, cleaned and skin re-approximated with dermabond glue. Pt tolerated well.   Nursing note and vitals reviewed.  Results for orders placed or performed during the hospital encounter of 06/13/16  Basic metabolic panel  Result Value Ref Range   Sodium 136 135 - 145 mmol/L   Potassium 5.1 3.5 - 5.1 mmol/L   Chloride 105 101 - 111 mmol/L   CO2 26 22 - 32 mmol/L   Glucose, Bld 284 (H) 65 - 99 mg/dL   BUN 22 (H) 6 - 20 mg/dL   Creatinine, Ser 3.26 (H) 0.44 - 1.00 mg/dL   Calcium 9.3 8.9 - 71.2 mg/dL   GFR calc non Af Amer 24 (L) >60 mL/min   GFR calc Af Amer 28 (L) >60 mL/min   Anion gap 5 5 - 15  CBC  Result Value Ref Range   WBC 5.8 4.0 - 10.5 K/uL   RBC 3.57 (L) 3.87 - 5.11 MIL/uL   Hemoglobin 9.7 (L) 12.0 - 15.0 g/dL   HCT 45.8 (L) 09.9 - 83.3 %   MCV 91.3 78.0 - 100.0 fL   MCH 27.2 26.0 - 34.0 pg   MCHC 29.8 (L) 30.0 - 36.0 g/dL   RDW 82.5 (H) 05.3 - 97.6 %   Platelets 145 (L) 150 - 400 K/uL  Brain natriuretic peptide  Result Value Ref Range   B Natriuretic Peptide 137.2 (H) 0.0 - 100.0 pg/mL      Assessment & Plan:   Problem List Items Addressed This Visit    CKD (chronic kidney disease), stage III (Chronic)    No h/o PUD or significant GERD. Reviewed latest EGD. Discussed PPI use with patient - encouraged trial QOD dosing.       Fall    Encouraged continued walker use.       Injury of left shoulder and upper arm - Primary    Check xray after fall. No signs of fracture or dislocation on my read, + significant degenerative arthritis.  Will await radiology eval. Possible RTC injury.  Pt on eliquis. Continue tylenol for pain control.       Relevant Orders   DG Shoulder Left (Completed)   Skin tear of elbow without complication    Cleaned and reapproximated skin flap with dermabond skin glue. Simple layer closure. Pt  tolerated well. Aftercare discussed.  Update Td today.      Relevant Orders   Td : Tetanus/diphtheria >7yo Preservative  free (Completed)    Other Visit Diagnoses    Need for influenza vaccination       Relevant Orders   Flu Vaccine QUAD 36+ mos PF IM (Fluarix & Fluzone Quad PF) (Completed)       Follow up plan: Return if symptoms worsen or fail to improve.  Eustaquio Boyden, MD

## 2016-06-24 NOTE — Patient Instructions (Addendum)
Flu shot today Tetanus shot today (Td) We will call you with xray results.

## 2016-06-24 NOTE — Progress Notes (Signed)
Pre visit review using our clinic review tool, if applicable. No additional management support is needed unless otherwise documented below in the visit note. 

## 2016-06-24 NOTE — Telephone Encounter (Signed)
She was dry at 180 a few weeks ago, and lasix scaled back.  If not SOB would continue to follow.  Can she get BMET/BNP tomorrow?    Casimiro Needle [address]" [place], PA-C 06/24/2016 7:21 AM

## 2016-06-24 NOTE — Telephone Encounter (Signed)
Patients daughter aware and voiced understanding

## 2016-06-25 ENCOUNTER — Other Ambulatory Visit: Payer: Self-pay | Admitting: Nurse Practitioner

## 2016-06-25 ENCOUNTER — Other Ambulatory Visit: Payer: Self-pay | Admitting: Internal Medicine

## 2016-06-25 ENCOUNTER — Other Ambulatory Visit: Payer: Self-pay | Admitting: Family Medicine

## 2016-06-25 DIAGNOSIS — Z9181 History of falling: Secondary | ICD-10-CM | POA: Insufficient documentation

## 2016-06-25 DIAGNOSIS — K219 Gastro-esophageal reflux disease without esophagitis: Secondary | ICD-10-CM

## 2016-06-25 NOTE — Assessment & Plan Note (Signed)
Encouraged continued walker use.

## 2016-06-25 NOTE — Assessment & Plan Note (Addendum)
Check xray after fall. No signs of fracture or dislocation on my read, + significant degenerative arthritis. Will await radiology eval. Possible RTC injury.  Pt on eliquis. Continue tylenol for pain control.

## 2016-06-25 NOTE — Assessment & Plan Note (Addendum)
Cleaned and reapproximated skin flap with dermabond skin glue. Simple layer closure. Pt tolerated well. Aftercare discussed.  Update Td today.

## 2016-06-25 NOTE — Assessment & Plan Note (Signed)
No h/o PUD or significant GERD. Reviewed latest EGD. Discussed PPI use with patient - encouraged trial QOD dosing.

## 2016-07-01 ENCOUNTER — Encounter (HOSPITAL_COMMUNITY): Payer: Self-pay

## 2016-07-01 ENCOUNTER — Ambulatory Visit (HOSPITAL_COMMUNITY)
Admission: RE | Admit: 2016-07-01 | Discharge: 2016-07-01 | Disposition: A | Discharge status: Home / Self Care | Payer: Medicare Other | Source: Ambulatory Visit | Attending: Cardiology | Admitting: Cardiology

## 2016-07-01 VITALS — BP 158/82 | HR 91 | Wt 185.8 lb

## 2016-07-01 DIAGNOSIS — I5022 Chronic systolic (congestive) heart failure: Secondary | ICD-10-CM | POA: Diagnosis not present

## 2016-07-01 DIAGNOSIS — D509 Iron deficiency anemia, unspecified: Secondary | ICD-10-CM | POA: Diagnosis not present

## 2016-07-01 DIAGNOSIS — I5042 Chronic combined systolic (congestive) and diastolic (congestive) heart failure: Secondary | ICD-10-CM | POA: Insufficient documentation

## 2016-07-01 DIAGNOSIS — K224 Dyskinesia of esophagus: Secondary | ICD-10-CM | POA: Diagnosis not present

## 2016-07-01 DIAGNOSIS — I350 Nonrheumatic aortic (valve) stenosis: Secondary | ICD-10-CM | POA: Diagnosis not present

## 2016-07-01 DIAGNOSIS — R001 Bradycardia, unspecified: Secondary | ICD-10-CM | POA: Insufficient documentation

## 2016-07-01 DIAGNOSIS — IMO0001 Reserved for inherently not codable concepts without codable children: Secondary | ICD-10-CM

## 2016-07-01 DIAGNOSIS — Z950 Presence of cardiac pacemaker: Secondary | ICD-10-CM | POA: Insufficient documentation

## 2016-07-01 DIAGNOSIS — I6523 Occlusion and stenosis of bilateral carotid arteries: Secondary | ICD-10-CM | POA: Insufficient documentation

## 2016-07-01 DIAGNOSIS — N183 Chronic kidney disease, stage 3 unspecified: Secondary | ICD-10-CM

## 2016-07-01 DIAGNOSIS — I2510 Atherosclerotic heart disease of native coronary artery without angina pectoris: Secondary | ICD-10-CM | POA: Diagnosis not present

## 2016-07-01 DIAGNOSIS — I480 Paroxysmal atrial fibrillation: Secondary | ICD-10-CM | POA: Diagnosis not present

## 2016-07-01 DIAGNOSIS — J449 Chronic obstructive pulmonary disease, unspecified: Secondary | ICD-10-CM

## 2016-07-01 DIAGNOSIS — Z7901 Long term (current) use of anticoagulants: Secondary | ICD-10-CM | POA: Insufficient documentation

## 2016-07-01 DIAGNOSIS — Z8679 Personal history of other diseases of the circulatory system: Secondary | ICD-10-CM

## 2016-07-01 DIAGNOSIS — E1121 Type 2 diabetes mellitus with diabetic nephropathy: Secondary | ICD-10-CM | POA: Diagnosis not present

## 2016-07-01 DIAGNOSIS — I130 Hypertensive heart and chronic kidney disease with heart failure and stage 1 through stage 4 chronic kidney disease, or unspecified chronic kidney disease: Secondary | ICD-10-CM | POA: Insufficient documentation

## 2016-07-01 DIAGNOSIS — M109 Gout, unspecified: Secondary | ICD-10-CM | POA: Diagnosis not present

## 2016-07-01 DIAGNOSIS — I10 Essential (primary) hypertension: Secondary | ICD-10-CM

## 2016-07-01 DIAGNOSIS — E1122 Type 2 diabetes mellitus with diabetic chronic kidney disease: Secondary | ICD-10-CM | POA: Diagnosis not present

## 2016-07-01 DIAGNOSIS — E785 Hyperlipidemia, unspecified: Secondary | ICD-10-CM | POA: Diagnosis not present

## 2016-07-01 DIAGNOSIS — E1165 Type 2 diabetes mellitus with hyperglycemia: Secondary | ICD-10-CM

## 2016-07-01 LAB — BASIC METABOLIC PANEL
ANION GAP: 9 (ref 5–15)
BUN: 26 mg/dL — ABNORMAL HIGH (ref 6–20)
CO2: 25 mmol/L (ref 22–32)
Calcium: 9.2 mg/dL (ref 8.9–10.3)
Chloride: 102 mmol/L (ref 101–111)
Creatinine, Ser: 1.73 mg/dL — ABNORMAL HIGH (ref 0.44–1.00)
GFR calc Af Amer: 30 mL/min — ABNORMAL LOW (ref >60)
GFR, EST NON AFRICAN AMERICAN: 26 mL/min — AB (ref >60)
Glucose, Bld: 334 mg/dL — ABNORMAL HIGH (ref 65–99)
POTASSIUM: 5.5 mmol/L — AB (ref 3.5–5.1)
SODIUM: 136 mmol/L (ref 135–145)

## 2016-07-01 LAB — BRAIN NATRIURETIC PEPTIDE: B NATRIURETIC PEPTIDE 5: 253.1 pg/mL — AB (ref 0.0–100.0)

## 2016-07-01 NOTE — Patient Instructions (Signed)
Routine lab work today. Will notify you of abnormal results, otherwise no news is good news!  Follow up 6 weeks with Dr. McLean.  Do the following things EVERYDAY: 1) Weigh yourself in the morning before breakfast. Write it down and keep it in a log. 2) Take your medicines as prescribed 3) Eat low salt foods-Limit salt (sodium) to 2000 mg per day.  4) Stay as active as you can everyday 5) Limit all fluids for the day to less than 2 liters 

## 2016-07-01 NOTE — Progress Notes (Signed)
Patient ID: Danielle Benitez, female   DOB: July 21, 1932, 82 y.o.   MRN: [id number]     Advanced Heart Failure Clinic Note   PCP: Dr. Para March HF Cardiology: Dr. Shirlee Latch  82 yo with history of HTN, orthostatic hypotension, symptomatic bradycardia with Medtronic PPM, paroxysmal atrial fibrillation chronic systolic CHF, CKD stage III, and iron deficiency anemia presents for cardiology followup. Around the beginning of 9/16, she began to develop exertional dyspnea. She was seen in the cardiology office on 07/25/15. She was in atrial fibrillation with RVR and echo showed EF 35-40% (had been 50-55% in past). PPM interrogation showed that atrial fibrillation had begun 07/11/15, so exertional dyspnea had preceded this by about 4 weeks. She was admitted from 10/7-10/17. She was anemic with hemoglobin 8.7. She was heme negative but Fe deficient. EGD was not done. She had TEE-guided DCCV to NSR. She also had AKI with creatinine up to 3.41, but it trended back down. She was sent home in NSR. She was readmitted 10/19-10/23 with recurrent atrial fibrillation and acute on chronic systolic CHF. She was diuresed about 10 lbs. It was decided to rate control her rather than repeat cardioversion, and she was sent home. she was only at home for about a day and developed increased dyspnea so came back to the ER and was re-admitted.  She was diuresed with IV Lasix and started on amiodarone.  She underwent DCCV on 10/26 but atrial fibrillation recurred on 10/29.  More amiodarone was loaded, and she went back into NSR.   Lexiscan Cardiolite in 11/16 showed basal inferior/inferolatearl fixed defect consistent with infarction but no ischemia.    Repeat echo was done 1/17.  This showed EF 45% with normal RV size and mildly decreased systolic function.   She was admitted in 7/17 with suspected infection of her device.  She had her PPM extracted and got a temporary-permanent device with later Medtronic PPM replacement.   She  presents today for regular follow up. Has fallen since last visit. Right shoulder xray with no acute break but arthritis. Weight stable. Weight at home ~ 185. Says she did wake up SOB one night.  Does get SOB bathing or changing clothes. Occasional lightheadedness with rapid standing, but not marked or limiting. Activity limited to ADLs and small work around the house.   Labs (7/16): LDL 48, HDL 55 Labs (10/16): K 3.9, creatinine 2.08 Labs (11/16): K 4.9, creatinine 2.5, LFTs normal, TSH normal, HCT 43.6 Labs (12/16): K 5.2, creatinine 1.73, HCT 41.9, plts 123 Labs (3/17): LFTs normal Labs (7/17): creatinine 1.87 Labs (05/12/16) K 5.0, Creatinine 1.52 Labs (05/25/16) K 5.2, Creatinine 1.87  PMH: 1. HTN but also with history of orthostatic hypotension. 2. CKD stage III 3. Aortic stenosis: Mild 4. Symptomatic bradycardia s/p Medtronic PPM. - PPM infection 7/17 with device replacement.  5. Hyperlipidemia 6. Atrial fibrillation: Paroxysmal. TEE-guided DCCV in 10/16. She had DCCV again latera in 10/16 and was started on amiodarone.   - PFTs (11/16) with normal spirometry, severe diffusion defect c/w pulmonary vascular disease.  7. Type II diabetes 8. Gout 9. Fe-deficiency anemia 10. Chronic systolic CHF: Echo 11/14 with EF 50-55%. Echo (10/16) with EF 35-40%, moderate LV dilation, diffuse hypokinesis, mild AS, moderate MR, PASP 48 mmHg.  Lexiscan Cardiolite (11/16) with EF 31%, basal inferior and inferolateral fixed defect possibly consistent with infarction, no ischemia; degree of LV hypokinesis was out of proportion to the perfusion defect.  Echo (1/17) with EF 45%, normal RV size with mildly  decreased systolic function, aortic sclerosis without stenosis.  11. Esophageal dysmotility  SH: Lives alone but daughter is nearby.  Non-smoker. No ETOH.    FH: No premature CAD.   ROS: All systems reviewed and negative except as per HPI.    Current Outpatient Prescriptions  Medication Sig  Dispense Refill  . acetaminophen (TYLENOL) 325 MG tablet Take 1-2 tablets (325-650 mg total) by mouth 3 (three) times daily as needed. (Patient taking differently: Take 650 mg by mouth 3 (three) times daily as needed. )    . albuterol (PROVENTIL HFA;VENTOLIN HFA) 108 (90 BASE) MCG/ACT inhaler Inhale 2 puffs into the lungs every 6 (six) hours as needed for wheezing or shortness of breath. 1 Inhaler 0  . allopurinol (ZYLOPRIM) 300 MG tablet Take 1 tablet (300 mg total) by mouth daily. 90 tablet 3  . amiodarone (PACERONE) 200 MG tablet Take 1 tablet (200 mg total) by mouth daily. 90 tablet 3  . apixaban (ELIQUIS) 2.5 MG TABS tablet Take 1 tablet (2.5 mg total) by mouth 2 (two) times daily. 60 tablet 11  . atorvastatin (LIPITOR) 40 MG tablet TAKE 1/2 TABLET BY MOUTH ONCE DAILY AT 6PM 90 tablet 0  . cetirizine (ZYRTEC) 10 MG tablet Take 10 mg by mouth daily.    . DULoxetine (CYMBALTA) 20 MG capsule Take 1 capsule (20 mg total) by mouth at bedtime. Take 1 tablet by mouth daily to help treat anxiety and nerves. 90 capsule 3  . fluticasone (FLONASE) 50 MCG/ACT nasal spray Place 1 spray into both nostrils daily as needed for allergies or rhinitis.    . furosemide (LASIX) 40 MG tablet Take 1 tablet (40 mg total) by mouth daily. (Patient taking differently: Take 40 mg by mouth every other day. ) 30 tablet 6  . glimepiride (AMARYL) 1 MG tablet Take 1 tablet (1 mg total) by mouth daily with breakfast. 90 tablet 3  . isosorbide mononitrate (IMDUR) 30 MG 24 hr tablet Take 1 tablet (30 mg total) by mouth daily. 30 tablet 6  . metoprolol succinate (TOPROL-XL) 50 MG 24 hr tablet TAKE 1 TABLET BY MOUTH TWICE A DAY 60 tablet 6  . ondansetron (ZOFRAN ODT) 4 MG disintegrating tablet Take 1 tablet (4 mg total) by mouth every 8 (eight) hours as needed for nausea or vomiting. 20 tablet 0  . ONGLYZA 2.5 MG TABS tablet TAKE 1 TABLET BY MOUTH DAILY 30 tablet 5  . pantoprazole (PROTONIX) 40 MG tablet TAKE 1 TABLET BY MOUTH DAILY  30 tablet 5  . senna-docusate (SENOKOT-S) 8.6-50 MG tablet Take 1 tablet by mouth daily as needed for mild constipation.    . Simethicone (GAS-X MAXIMUM STRENGTH PO) Take 1 tablet by mouth daily.     . traZODone (DESYREL) 50 MG tablet Take 0.5-1 tablets (25-50 mg total) by mouth at bedtime as needed for sleep. Don't take >50mg  in a day. 30 tablet 3   No current facility-administered medications for this encounter.    BP (!) 158/82 (BP Location: Left Arm, Patient Position: Sitting, Cuff Size: Normal)   Pulse 91   Wt 185 lb 12.8 oz (84.3 kg)   SpO2 99%   BMI 36.29 kg/m    Wt Readings from Last 3 Encounters:  07/01/16 185 lb 12.8 oz (84.3 kg)  06/24/16 184 lb 4 oz (83.6 kg)  06/13/16 185 lb 4 oz (84 kg)    General: Elderly appearing. NAD Neck: JVP ~7-8 cm, no thyromegaly or thyroid nodule.  Lungs: CTAB, normal effort CV:  Nondisplaced PMI.  Heart regular S1/S2, no S3/S4, no murmur.  Mild L carotid bruit. Normal pedal pulses.  Abdomen: Soft, NT, ND, no HSM. No bruits or masses. +BS  Skin: Intact without lesions or rashes.  Neurologic: Alert and oriented x 3.  Psych: Normal affect. Extremities: No clubbing or cyanosis. Trace ankle edema.  HEENT: Normal.   Assessment/Plan: 1. Chronic combined systolic and diastolic CHF: EF 30-13% by echo 10/16. Etiology uncertain: could be tachy-mediated cardiomyopathy, but symptoms seemed to predate the onset of atrial fibrillation. Possible viral myocarditis. Cannot rule out ischemic cardiomyopathy, but no definite anginal symptoms. Cardiolite in 11/16 showed a small area possibly of prior infarction but EF and wall motion abnormalities were out of proportion to the perfusion defect. By 1/17, EF was improved to 45%.   - NYHA class III-IIIb symptoms chronically. Volume status looks stable.    - Continue lasix 40 mg daily. Can take an extra as needed.   - Continue Toprol XL 50 mg BID.  - No ACEI or spironolactone for now with CKD.  She tried  hydralazine but did not tolerate it due to dizziness even on a low dose. - Echo 06/03/16 with EF 55-60%, normal RV. + Diastolic dysfunction.  2. Atrial fibrillation: Tolerates poorly. - NSR by exam today.  - Continue Eliquis.   - Toprol XL 50 mg bid.  - Continue amiodarone 200 mg daily. TSH and LFTs normal 05/12/16.  She will need a regular eye exam.  She will need PFTs in 11/17.  3. CKD Stage III: BMET today.  4. Medtronic PPM for symptomatic bradycardia.  Recently replaced due to infection.  5. CAD: Possible old infarction on Cardiolite.  No ischemia.   - No CP. No cath Would not cath with significant CKD and no chest pain.  Continue statin, good lipids in 7/16.  No aspirin given Eliquis use.  6. Fatigue - Have recommended [employer] program for reconditioning.  -  Insurance has said they wouldn't cover cardiac rehab in the past.  7. Unequal blood pressures in upper extremities.  - > 30 mm Hg at past visit, but was not reproducible on further exam per [place] team. - [place] without subclavian stenosis.  8 . Carotid stenosis 1-39% Right ICA stenosis. 40-59% Left ICA stenosis. Follow up 1 year 9. HTN - Mildly elevated in clinic today.  Took am meds. BP averages 130s at home. - She has responded poorly at attempts to up-titration in the past, and had a recent fall.  - Have asked her to monitor at home at least twice daily, and call if BPs average in 150-160s.  Labs today. Follow up 6 weeks with Dr. Loralie Champagne, PA-C 07/01/2016

## 2016-07-02 ENCOUNTER — Telehealth (HOSPITAL_COMMUNITY): Payer: Self-pay | Admitting: Cardiology

## 2016-07-02 DIAGNOSIS — I509 Heart failure, unspecified: Secondary | ICD-10-CM

## 2016-07-02 NOTE — Telephone Encounter (Signed)
Patient aware. Will have rechecked next week at [facility]

## 2016-07-02 NOTE — Telephone Encounter (Signed)
-----   Message from Graciella Freer, PA-C sent at 07/01/2016  4:20 PM EDT ----- Not on any potassium supplementation.  Please make sure she is not using any [employer]Salt[employer] (They have K instead of sodium).  Needs to do Low K diet, even more strict than she has been doing.  Needs recheck next week.     Casimiro Needle [address]" [place], PA-C 07/01/2016 4:20 PM

## 2016-07-05 ENCOUNTER — Other Ambulatory Visit: Payer: Self-pay | Admitting: Family Medicine

## 2016-07-06 ENCOUNTER — Encounter: Payer: Self-pay | Admitting: Internal Medicine

## 2016-07-06 ENCOUNTER — Ambulatory Visit (INDEPENDENT_AMBULATORY_CARE_PROVIDER_SITE_OTHER): Payer: Medicare Other | Admitting: Internal Medicine

## 2016-07-06 DIAGNOSIS — I4891 Unspecified atrial fibrillation: Secondary | ICD-10-CM

## 2016-07-06 DIAGNOSIS — Z950 Presence of cardiac pacemaker: Secondary | ICD-10-CM

## 2016-07-06 NOTE — Progress Notes (Signed)
HPI Danielle Benitez returns today for followup. She is a pleasant elderly woman with a h/o CHB, s/p PPM insertion who develped a pocket erosion about 4 years after her initial device implant. She underwent extraction and insertion of a new PPM and has been stable in the interim. She denies chest pain or sob. No syncope. No fevers or chills. Allergies  Allergen Reactions  . Beta Adrenergic Blockers Other (See Comments)    Couldn't speak or hear Metoprolol seems ok  . Jardiance [Empagliflozin] Other (See Comments)    Rash, kidney issues     Current Outpatient Prescriptions  Medication Sig Dispense Refill  . acetaminophen (TYLENOL) 325 MG tablet Take 1-2 tablets (325-650 mg total) by mouth 3 (three) times daily as needed.    Marland Kitchen albuterol (PROVENTIL HFA;VENTOLIN HFA) 108 (90 BASE) MCG/ACT inhaler Inhale 2 puffs into the lungs every 6 (six) hours as needed for wheezing or shortness of breath. 1 Inhaler 0  . allopurinol (ZYLOPRIM) 300 MG tablet TAKE 1 TABLET BY MOUTH DAILY 90 tablet 1  . amiodarone (PACERONE) 200 MG tablet Take 1 tablet (200 mg total) by mouth daily. 90 tablet 3  . atorvastatin (LIPITOR) 40 MG tablet TAKE 1/2 TABLET BY MOUTH ONCE DAILY AT 6PM 90 tablet 0  . cetirizine (ZYRTEC) 10 MG tablet Take 10 mg by mouth daily.    . DULoxetine (CYMBALTA) 20 MG capsule Take 20 mg by mouth at bedtime.    Marland Kitchen ELIQUIS 5 MG TABS tablet Take 0.5 tablets by mouth 2 (two) times daily.    . fluticasone (FLONASE) 50 MCG/ACT nasal spray Place 1 spray into both nostrils daily as needed for allergies or rhinitis.    . furosemide (LASIX) 40 MG tablet Take 1 tablet (40 mg total) by mouth daily. 30 tablet 6  . glimepiride (AMARYL) 1 MG tablet Take 1 tablet (1 mg total) by mouth daily with breakfast. 90 tablet 3  . isosorbide mononitrate (IMDUR) 30 MG 24 hr tablet Take 1 tablet (30 mg total) by mouth daily. 30 tablet 6  . metoprolol succinate (TOPROL-XL) 50 MG 24 hr tablet TAKE 1 TABLET BY MOUTH TWICE A DAY  60 tablet 6  . Multiple Vitamins-Minerals (ONE-A-DAY 50 PLUS PO) Take 1 capsule by mouth daily.    . ondansetron (ZOFRAN ODT) 4 MG disintegrating tablet Take 1 tablet (4 mg total) by mouth every 8 (eight) hours as needed for nausea or vomiting. 20 tablet 0  . ONGLYZA 2.5 MG TABS tablet TAKE 1 TABLET BY MOUTH DAILY 30 tablet 5  . pantoprazole (PROTONIX) 40 MG tablet TAKE 1 TABLET BY MOUTH DAILY 30 tablet 5  . senna-docusate (SENOKOT-S) 8.6-50 MG tablet Take 1 tablet by mouth daily as needed for mild constipation.    . Simethicone (GAS-X MAXIMUM STRENGTH PO) Take 1 tablet by mouth daily.     . traZODone (DESYREL) 50 MG tablet Take 0.5-1 tablets (25-50 mg total) by mouth at bedtime as needed for sleep. Don't take >50mg  in a day. 30 tablet 3   No current facility-administered medications for this visit.      Past Medical History:  Diagnosis Date  . Abnormality of gait   . Anemia, unspecified   . Anticoagulated- Eliquis 07/31/2015  . Anxiety state, unspecified   . Aortic stenosis    a. Mild by echo 08/2013.  Marland Kitchen Atrial fibrillation - developed 07/11/15 by PPM interrogation, recurrent after DCCV 10/14 07/11/2015    recurrent after DCCV 10/14  . Bifascicular  block    afib  . Chronic systolic CHF (congestive heart failure) (HCC)    a. Dx 07/2015 - EF 35-40%, unclear etiology  . CKD (chronic kidney disease), stage III   . Depressive disorder, not elsewhere classified   . DM (diabetes mellitus), type 2, uncontrolled, with renal complications (HCC) 01/08/2012  . Eczema 11/22/2013  . Esophageal dysmotility 08/05/2015  . Essential hypertension   . Female stress incontinence 05/06/2015  . Full dentures   . GERD (gastroesophageal reflux disease)   . Gout   . HOH (hard of hearing)   . Internal hemorrhoids without mention of complication   . Lumbago   . Memory loss   . Mild CAD    a. Cath 2013: 20-30% prox RCA.  Marland Kitchen Myocardial infarction (HCC) 07/2015  . Nonspecific abnormal results of liver  function study   . Obesity, unspecified   . Orthostatic hypotension   . Osteoarthritis   . Other and unspecified hyperlipidemia   . Pain in joint, shoulder region 11/22/2013   left   . Personal history of fall   . Presence of permanent cardiac pacemaker   . Pruritus 11/22/2013  . S/P cardiac pacemaker procedure, PPM Medtronic REVO placed 01/11/2012 01/11/2012   a. for symptomatic bradycardia. Followed by Dr. Ladona Ridgel.  . Shortness of breath dyspnea    with movement-  . Wears glasses     ROS:   All systems reviewed and negative except as noted in the HPI.   Past Surgical History:  Procedure Laterality Date  . CARDIAC CATHETERIZATION  12/03/2003   Dr Clarene Duke  . CARDIOVERSION N/A 07/25/2015   Procedure: CARDIOVERSION;  Surgeon: Pricilla Riffle, MD;  Location: [facility] ENDOSCOPY;  Service: Cardiovascular;  Laterality: N/A;  . CARDIOVERSION N/A 08/06/2015   Procedure: CARDIOVERSION;  Surgeon: Laurey Morale, MD;  Location: [facility] ENDOSCOPY;  Service: Cardiovascular;  Laterality: N/A;  . CATARACT EXTRACTION W/ INTRAOCULAR LENS  IMPLANT, BILATERAL Bilateral 03/2015-04/2015  . COLONOSCOPY     ?????  . EP IMPLANTABLE DEVICE N/A 04/15/2016   Procedure: Pacemaker Implant;  Surgeon: Marinus Maw, MD;  Location: [facility] INVASIVE CV LAB;  Service: Cardiovascular;  Laterality: N/A;  . ESOPHAGOGASTRODUODENOSCOPY N/A 08/08/2015   Procedure: ESOPHAGOGASTRODUODENOSCOPY (EGD);  Surgeon: Iva Boop, MD;  Location: [facility] ENDOSCOPY;  Service: Endoscopy;  Laterality: N/A;  . EXTREMITY CYST EXCISION     finger  . EYE SURGERY Bilateral    catartact  . INSERT / REPLACE / REMOVE PACEMAKER    . LEFT HEART CATHETERIZATION WITH CORONARY ANGIOGRAM N/A 01/10/2012   Procedure: LEFT HEART CATHETERIZATION WITH CORONARY ANGIOGRAM;  Surgeon: Chrystie Nose, MD;  Location: [facility] CATH LAB;  Service: Cardiovascular;  Laterality: N/A;  . ORIF WRIST FRACTURE Right 04/09/2014   Procedure: OPEN REDUCTION INTERNAL FIXATION (ORIF) RIGHT DISPLACED   DISTAL RADIUS  FRACTURE;  Surgeon: Dominica Severin, MD;  Location: [facility];  Service: Orthopedics;  Laterality: Right;  . PACEMAKER LEAD REMOVAL  04/12/2016   TEMPORARY PACEMAKER INSERTION  . PACEMAKER LEAD REMOVAL  04/15/2016   Procedure: Pacemaker Lead Removal;  Surgeon: Marinus Maw, MD;  Location: [facility] INVASIVE CV LAB;  Service: Cardiovascular;;  . PACEMAKER LEAD REMOVAL N/A 04/12/2016   Procedure: PACEMAKER LEAD REMOVAL;  Surgeon: Marinus Maw, MD;  Location: [facility] OR;  Service: Cardiovascular;  Laterality: N/A;  . PERMANENT PACEMAKER INSERTION Left 01/11/2012   Procedure: PERMANENT PACEMAKER INSERTION;  Surgeon: Thurmon Fair, MD;  Location: [facility] CATH LAB;  Service: Cardiovascular;  Laterality:  Left;  . TEE WITHOUT CARDIOVERSION N/A 07/25/2015   Procedure: TRANSESOPHAGEAL ECHOCARDIOGRAM (TEE);  Surgeon: Pricilla RifflePaula Ross V, MD;  [facility];  Service: Cardiovascular;  Laterality: N/A;     Family History  Problem Relation Age of Onset  . Breast cancer Mother   . Cancer Mother   . Diabetes Daughter   . Hypertension Daughter   . Cancer Brother      Social History   Social History  . Marital status: Widowed    Spouse name: N/A  . Number of children: N/A  . Years of education: N/A   Occupational History  . Not on file.   Social History Main Topics  . Smoking status: Never Smoker  . Smokeless tobacco: Never Used  . Alcohol use No  . Drug use: No  . Sexual activity: No   Other Topics Concern  . Not on file   Social History Narrative   Physically inactive. She says she hurts in her back and knees too much to do much walking.   9th grade   Worked in [employer]   Living with daughter    Doesn't drive as of 60/454011/2016     BP 124/80   Pulse 84   Ht 5' (1.524 m)   Wt 185 lb 6.4 oz (84.1 kg)   BMI 36.21 kg/m   Physical Exam:  Well appearing elderly woman, NAD HEENT: Unremarkable Neck:  6 cm JVD, no thyromegally Lymphatics:  No adenopathy Back:   No CVA tenderness Lungs:  Clear with no wheezes HEART:  Regular rate rhythm, no murmurs, no rubs, no clicks Abd:  soft, positive bowel sounds, no organomegally, no rebound, no guarding Ext:  2 plus pulses, no edema, no cyanosis, no clubbing Skin:  No rashes no nodules Neuro:  CN II through XII intact, motor grossly intact  EKG - NSR with RBBB and LAFB  DEVICE  Normal device function.  See PaceArt for details.   Assess/Plan: 1. CHB - she is stable s/p PPM. Her conduction is present today 2. PPM - her Medtronic MRI compatible device is working normally. 3. Atrial fib - she is maintaining NSR. No change in meds.   Leonia ReevesGregg Dell Hurtubise,M.D.

## 2016-07-06 NOTE — Patient Instructions (Addendum)
Medication Instructions:  Your physician recommends that you continue on your current medications as directed. Please refer to the Current Medication list given to you today.   Labwork: None Ordered   Testing/Procedures: None Ordered   Follow-Up: Your physician wants you to follow-up in: 9 months with Dr. Taylor. You will receive a reminder letter in the mail two months in advance. If you don't receive a letter, please call our office to schedule the follow-up appointment.  Remote monitoring is used to monitor your Pacemaker from home. This monitoring reduces the number of office visits required to check your device to one time per year. It allows us to keep an eye on the functioning of your device to ensure it is working properly. You are scheduled for a device check from home on 10/05/16. You may send your transmission at any time that day. If you have a wireless device, the transmission will be sent automatically. After your physician reviews your transmission, you will receive a postcard with your next transmission date.     Any Other Special Instructions Will Be Listed Below (If Applicable).     If you need a refill on your cardiac medications before your next appointment, please call your pharmacy.   

## 2016-07-07 ENCOUNTER — Encounter: Payer: Self-pay | Admitting: Family Medicine

## 2016-07-08 ENCOUNTER — Other Ambulatory Visit (INDEPENDENT_AMBULATORY_CARE_PROVIDER_SITE_OTHER): Payer: Medicare Other

## 2016-07-08 DIAGNOSIS — I509 Heart failure, unspecified: Secondary | ICD-10-CM

## 2016-07-08 NOTE — Addendum Note (Signed)
Addended by: Alvina Chou on: 07/08/2016 05:29 PM   Modules accepted: Orders

## 2016-07-09 LAB — BASIC METABOLIC PANEL
BUN: 29 mg/dL — AB (ref 6–23)
CHLORIDE: 99 meq/L (ref 96–112)
CO2: 30 mEq/L (ref 19–32)
Calcium: 9 mg/dL (ref 8.4–10.5)
Creatinine, Ser: 1.74 mg/dL — ABNORMAL HIGH (ref 0.40–1.20)
GFR: 29.73 mL/min — AB (ref >60.00)
Glucose, Bld: 214 mg/dL — ABNORMAL HIGH (ref 70–99)
POTASSIUM: 4.9 meq/L (ref 3.5–5.1)
Sodium: 137 mEq/L (ref 135–145)

## 2016-07-30 ENCOUNTER — Telehealth: Payer: Self-pay | Admitting: Family Medicine

## 2016-07-30 NOTE — Telephone Encounter (Signed)
fmla paperwork  Copy of last fmla from cardology also in folder In dr duncan's in box For review and signature Please give to [facility] when finished I will be out of the office next week  Thanks

## 2016-07-30 NOTE — Telephone Encounter (Signed)
I'll work on the hard copy.  Thanks.  

## 2016-08-02 ENCOUNTER — Telehealth: Payer: Self-pay

## 2016-08-02 NOTE — Telephone Encounter (Signed)
Stephanie with [employer] left v/m requesting cb and if no answer leave message. Wanting to know if pt is on dialysis or seeing nephrologist. [name] pt daughter said no. Left v/m to that effect.

## 2016-08-03 NOTE — Telephone Encounter (Signed)
FMLA faxed to [employer]  Copy sent for scan Copy for office file Copy given to pt daughter, Colin Mulders.

## 2016-08-10 ENCOUNTER — Other Ambulatory Visit: Payer: Self-pay | Admitting: Family Medicine

## 2016-08-20 ENCOUNTER — Ambulatory Visit (HOSPITAL_COMMUNITY)
Admission: RE | Admit: 2016-08-20 | Discharge: 2016-08-20 | Disposition: A | Discharge status: Home / Self Care | Payer: Medicare Other | Source: Ambulatory Visit | Attending: Cardiology | Admitting: Cardiology

## 2016-08-20 VITALS — BP 166/90 | HR 90 | Wt 189.0 lb

## 2016-08-20 DIAGNOSIS — R001 Bradycardia, unspecified: Secondary | ICD-10-CM | POA: Insufficient documentation

## 2016-08-20 DIAGNOSIS — I739 Peripheral vascular disease, unspecified: Secondary | ICD-10-CM

## 2016-08-20 DIAGNOSIS — R5383 Other fatigue: Secondary | ICD-10-CM | POA: Diagnosis not present

## 2016-08-20 DIAGNOSIS — I4510 Unspecified right bundle-branch block: Secondary | ICD-10-CM | POA: Diagnosis not present

## 2016-08-20 DIAGNOSIS — E785 Hyperlipidemia, unspecified: Secondary | ICD-10-CM | POA: Insufficient documentation

## 2016-08-20 DIAGNOSIS — Z79899 Other long term (current) drug therapy: Secondary | ICD-10-CM

## 2016-08-20 DIAGNOSIS — I2510 Atherosclerotic heart disease of native coronary artery without angina pectoris: Secondary | ICD-10-CM | POA: Diagnosis not present

## 2016-08-20 DIAGNOSIS — K224 Dyskinesia of esophagus: Secondary | ICD-10-CM | POA: Diagnosis not present

## 2016-08-20 DIAGNOSIS — Z7901 Long term (current) use of anticoagulants: Secondary | ICD-10-CM | POA: Diagnosis not present

## 2016-08-20 DIAGNOSIS — I480 Paroxysmal atrial fibrillation: Secondary | ICD-10-CM | POA: Insufficient documentation

## 2016-08-20 DIAGNOSIS — I130 Hypertensive heart and chronic kidney disease with heart failure and stage 1 through stage 4 chronic kidney disease, or unspecified chronic kidney disease: Secondary | ICD-10-CM | POA: Insufficient documentation

## 2016-08-20 DIAGNOSIS — I5022 Chronic systolic (congestive) heart failure: Secondary | ICD-10-CM | POA: Diagnosis not present

## 2016-08-20 DIAGNOSIS — M109 Gout, unspecified: Secondary | ICD-10-CM | POA: Diagnosis not present

## 2016-08-20 DIAGNOSIS — I444 Left anterior fascicular block: Secondary | ICD-10-CM | POA: Insufficient documentation

## 2016-08-20 DIAGNOSIS — I452 Bifascicular block: Secondary | ICD-10-CM | POA: Insufficient documentation

## 2016-08-20 DIAGNOSIS — D509 Iron deficiency anemia, unspecified: Secondary | ICD-10-CM | POA: Insufficient documentation

## 2016-08-20 DIAGNOSIS — N183 Chronic kidney disease, stage 3 (moderate): Secondary | ICD-10-CM | POA: Insufficient documentation

## 2016-08-20 DIAGNOSIS — E1122 Type 2 diabetes mellitus with diabetic chronic kidney disease: Secondary | ICD-10-CM | POA: Diagnosis not present

## 2016-08-20 DIAGNOSIS — I6529 Occlusion and stenosis of unspecified carotid artery: Secondary | ICD-10-CM | POA: Diagnosis not present

## 2016-08-20 DIAGNOSIS — I951 Orthostatic hypotension: Secondary | ICD-10-CM | POA: Diagnosis not present

## 2016-08-20 LAB — CBC
HCT: 33.5 % — ABNORMAL LOW (ref 36.0–46.0)
Hemoglobin: 10.1 g/dL — ABNORMAL LOW (ref 12.0–15.0)
MCH: 25.8 pg — ABNORMAL LOW (ref 26.0–34.0)
MCHC: 30.1 g/dL (ref 30.0–36.0)
MCV: 85.7 fL (ref 78.0–100.0)
Platelets: 171 10*3/uL (ref 150–400)
RBC: 3.91 MIL/uL (ref 3.87–5.11)
RDW: 16.9 % — ABNORMAL HIGH (ref 11.5–15.5)
WBC: 6.5 10*3/uL (ref 4.0–10.5)

## 2016-08-20 LAB — COMPREHENSIVE METABOLIC PANEL
ALK PHOS: 123 U/L (ref 38–126)
ALT: 37 U/L (ref 14–54)
ANION GAP: 9 (ref 5–15)
AST: 67 U/L — ABNORMAL HIGH (ref 15–41)
Albumin: 3.5 g/dL (ref 3.5–5.0)
BILIRUBIN TOTAL: 0.7 mg/dL (ref 0.3–1.2)
BUN: 26 mg/dL — ABNORMAL HIGH (ref 6–20)
CALCIUM: 9 mg/dL (ref 8.9–10.3)
CO2: 25 mmol/L (ref 22–32)
CREATININE: 1.79 mg/dL — AB (ref 0.44–1.00)
Chloride: 101 mmol/L (ref 101–111)
GFR calc non Af Amer: 25 mL/min — ABNORMAL LOW (ref >60)
GFR, EST AFRICAN AMERICAN: 29 mL/min — AB (ref >60)
GLUCOSE: 312 mg/dL — AB (ref 65–99)
Potassium: 4.3 mmol/L (ref 3.5–5.1)
Sodium: 135 mmol/L (ref 135–145)
TOTAL PROTEIN: 6.9 g/dL (ref 6.5–8.1)

## 2016-08-20 LAB — TSH: TSH: 2.329 u[IU]/mL (ref 0.350–4.500)

## 2016-08-20 MED ORDER — FUROSEMIDE 40 MG PO TABS: ORAL_TABLET | ORAL | 6 refills | Status: DC

## 2016-08-20 NOTE — Patient Instructions (Signed)
INCREASE Lasix to 40 mg (2 tabs) twice daily for 5 days, then reduce to 40 mg (2 tabs) in am and 20 mg (1 tab) in pm.  Will arrange for an overnight oximetry test in your home to assess possible desaturation during sleep. [facility] will contact you to arrange this.  Will schedule you for Pulmonary Function Test with [facility] on 2nd floor west wing. Arrive at [facility] (Entrance A on [address] parking available. Check in at front desk once you enter through sliding doors.  Routine lab work today. Will notify you of abnormal results, otherwise no news is good news!  Return in 10 days for repeat lab work.  Will schedule you for ABI vascular study at [facility] office. Address: [address], [place], [place] [address]  Phone: [phone]  EKG today.  Follow up 1 month with Dr. Shirlee Latch.  Do the following things EVERYDAY: 1) Weigh yourself in the morning before breakfast. Write it down and keep it in a log. 2) Take your medicines as prescribed 3) Eat low salt foods-Limit salt (sodium) to 2000 mg per day.  4) Stay as active as you can everyday 5) Limit all fluids for the day to less than 2 liters

## 2016-08-22 NOTE — Progress Notes (Signed)
Patient ID: Danielle Benitez, female   DOB: [birth year], 82 y.o.   MRN: [id number] PCP: Dr. Para March HF Cardiology: Dr. Shirlee Latch  82 yo with history of HTN, orthostatic hypotension, symptomatic bradycardia with Medtronic PPM, paroxysmal atrial fibrillation chronic systolic CHF, CKD stage III, and iron deficiency anemia presents for cardiology followup. Around the beginning of 9/16, she began to develop exertional dyspnea. She was seen in the cardiology office on 07/25/15. She was in atrial fibrillation with RVR and echo showed EF 35-40% (had been 50-55% in past). PPM interrogation showed that atrial fibrillation had begun 07/11/15, so exertional dyspnea had preceded this by about 4 weeks. She was admitted from 10/7-10/17. She was anemic with hemoglobin 8.7. She was heme negative but Fe deficient. EGD was not done. She had TEE-guided DCCV to NSR. She also had AKI with creatinine up to 3.41, but it trended back down. She was sent home in NSR. She was readmitted 10/19-10/23 with recurrent atrial fibrillation and acute on chronic systolic CHF. She was diuresed about 10 lbs. It was decided to rate control her rather than repeat cardioversion, and she was sent home. she was only at home for about a day and developed increased dyspnea so came back to the ER and was re-admitted.  She was diuresed with IV Lasix and started on amiodarone.  She underwent DCCV on 10/26 but atrial fibrillation recurred on 10/29.  More amiodarone was loaded, and she went back into NSR.   Lexiscan Cardiolite in 11/16 showed basal inferior/inferolatearl fixed defect consistent with infarction but no ischemia.    Repeat echo was done 1/17.  This showed EF 45% with normal RV size and mildly decreased systolic function.   She was admitted in 7/17 with suspected infection of her device.  She had her PPM extracted and got a temporary-permanent device with later Medtronic PPM replacement. Last echo in 8/17 showed EF 55-60% with mild LVH, normal  RV size and systolic function.   She has been more short of breath for about 2 wks. Moderate exertion around the house wears her out.  She will have to sit down and rest.  She has had 2 pillow orthopnea long-term.  She has had several falls in the last year.  None in the last month or so. Her feet hurt when walking, ok at rest.  She has arthritis in her knees.  Back also hurts with walking.  Weight is up 4 lbs.   ECG: a-paced, RBBB, LAFB  Labs (7/16): LDL 48, HDL 55 Labs (10/16): K 3.9, creatinine 2.08 Labs (11/16): K 4.9, creatinine 2.5, LFTs normal, TSH normal, HCT 43.6 Labs (12/16): K 5.2, creatinine 1.73, HCT 41.9, plts 123 Labs (3/17): LFTs normal Labs (7/17): creatinine 1.87 Labs (8/17): LDL 57, HDL 50 Labs (9/17): K 4.9, creatinine 1.74, BNP 253  PMH: 1. HTN but also with history of orthostatic hypotension. 2. CKD stage III 3. Aortic stenosis: Mild 4. Symptomatic bradycardia s/p Medtronic PPM. - PPM infection 7/17 with device replacement.  5. Hyperlipidemia 6. Atrial fibrillation: Paroxysmal. TEE-guided DCCV in 10/16. She had DCCV again latera in 10/16 and was started on amiodarone.   - PFTs (11/16) with normal spirometry, severe diffusion defect c/w pulmonary vascular disease.  7. Type II diabetes 8. Gout 9. Fe-deficiency anemia 10. Chronic diastolic CHF: Echo 11/14 with EF 50-55%. Echo (10/16) with EF 35-40%, moderate LV dilation, diffuse hypokinesis, mild AS, moderate MR, PASP 48 mmHg.  Lexiscan Cardiolite (11/16) with EF 31%, basal inferior and inferolateral fixed defect  possibly consistent with infarction, no ischemia; degree of LV hypokinesis was out of proportion to the perfusion defect.  Echo (1/17) with EF 45%, normal RV size with mildly decreased systolic function, aortic sclerosis without stenosis.  - Echo (8/17): EF 55-60%, mild LVH, normal RV size and systolic function.  11. Esophageal dysmotility 12. Carotid stenosis: Carotid dopplers (9/17) with 40-59% LICA  stenosis.   SH: Lives alone but daughter is nearby.  Nonsmoker. No ETOH.    FH: No premature CAD.   ROS: All systems reviewed and negative except as per HPI.    Current Outpatient Prescriptions  Medication Sig Dispense Refill  . acetaminophen (TYLENOL) 325 MG tablet Take 1-2 tablets (325-650 mg total) by mouth 3 (three) times daily as needed.    Marland Kitchen albuterol (PROVENTIL HFA;VENTOLIN HFA) 108 (90 BASE) MCG/ACT inhaler Inhale 2 puffs into the lungs every 6 (six) hours as needed for wheezing or shortness of breath. 1 Inhaler 0  . allopurinol (ZYLOPRIM) 300 MG tablet TAKE 1 TABLET BY MOUTH DAILY 90 tablet 1  . amiodarone (PACERONE) 200 MG tablet Take 1 tablet (200 mg total) by mouth daily. 90 tablet 3  . atorvastatin (LIPITOR) 40 MG tablet TAKE 1/2 TABLET BY MOUTH ONCE DAILY AT 6PM 90 tablet 0  . cetirizine (ZYRTEC) 10 MG tablet Take 10 mg by mouth daily.    . DULoxetine (CYMBALTA) 20 MG capsule Take 20 mg by mouth at bedtime.    Marland Kitchen ELIQUIS 5 MG TABS tablet Take 0.5 tablets by mouth 2 (two) times daily.    . fluticasone (FLONASE) 50 MCG/ACT nasal spray Place 1 spray into both nostrils daily as needed for allergies or rhinitis.    . furosemide (LASIX) 40 MG tablet Take 40 mg (2 tabs) in am and 20 mg (1 tab) in pm 90 tablet 6  . glimepiride (AMARYL) 1 MG tablet Take 1 tablet (1 mg total) by mouth daily with breakfast. 90 tablet 3  . isosorbide mononitrate (IMDUR) 30 MG 24 hr tablet Take 1 tablet (30 mg total) by mouth daily. 30 tablet 6  . metoprolol succinate (TOPROL-XL) 50 MG 24 hr tablet TAKE 1 TABLET BY MOUTH TWICE A DAY 60 tablet 6  . Multiple Vitamins-Minerals (ONE-A-DAY 50 PLUS PO) Take 1 capsule by mouth daily.    . ondansetron (ZOFRAN ODT) 4 MG disintegrating tablet Take 1 tablet (4 mg total) by mouth every 8 (eight) hours as needed for nausea or vomiting. 20 tablet 0  . ONE TOUCH ULTRA TEST test strip USE TO CHECK BLOOD SUGAR ONCE A DAY AS DIRECTED 100 each 3  . ONGLYZA 2.5 MG TABS tablet  TAKE 1 TABLET BY MOUTH DAILY 30 tablet 5  . pantoprazole (PROTONIX) 40 MG tablet TAKE 1 TABLET BY MOUTH DAILY 30 tablet 5  . senna-docusate (SENOKOT-S) 8.6-50 MG tablet Take 1 tablet by mouth daily as needed for mild constipation.    . Simethicone (GAS-X MAXIMUM STRENGTH PO) Take 1 tablet by mouth daily.     . traZODone (DESYREL) 50 MG tablet Take 0.5-1 tablets (25-50 mg total) by mouth at bedtime as needed for sleep. Don't take >50mg  in a day. 30 tablet 3   No current facility-administered medications for this encounter.    BP (!) 166/90 (BP Location: Left Arm, Patient Position: Sitting, Cuff Size: Normal)   Pulse 90   Wt 189 lb (85.7 kg)   SpO2 100%   BMI 36.91 kg/m  General: NAD Neck: JVP 8-9 cm, no thyromegaly or thyroid nodule.  Lungs: Clear to auscultation bilaterally with normal respiratory effort. CV: Nondisplaced PMI.  Heart regular S1/S2, no S3/S4, no murmur.  1+ ankle edema.  No carotid bruit.  Normal pedal pulses.  Abdomen: Soft, nontender, no hepatosplenomegaly, no distention.  Skin: Intact without lesions or rashes.  Neurologic: Alert and oriented x 3.  Psych: Normal affect. Extremities: No clubbing or cyanosis.  HEENT: Normal.   Assessment/Plan: 1. Chronic diastolic CHF: EF 78-46%35-40% by echo 10/16. Etiology uncertain: could be tachy-mediated cardiomyopathy, but symptoms seemed to predate the onset of atrial fibrillation. Possible viral myocarditis. Cannot rule out ischemic cardiomyopathy, but no definite anginal symptoms. Cardiolite in 11/16 showed a small area possibly of prior infarction but EF and wall motion abnormalities were out of proportion to the perfusion defect. By 8/17, EF was improved to 55-60%.  NYHA class III symptoms today with  volume overload on exam and weight gain.   - Increase Lasix to 40 mg bid x 5 days, then 40 qam/20 qpm. BMET today and again in 10 days.  - Continue current Toprol XL.  - I am going to arrange overnight oximetry to see if she needs  oxygen at night. 2. Atrial fibrillation: Tolerates poorly. A-paced today.  - Continue Eliquis - Toprol XL 50 mg bid.  - Continue amiodarone for maintenance of NSR. Check TSH and LFTs today.  She will need a regular eye exam.  She is due for PFTs, will arrange.  3. CKD Stage III: BMET today.  4. Medtronic PPM for symptomatic bradycardia.  Recently replaced due to infection.  5. CAD: Possible old infarction on Cardiolite.  No ischemia.  Would not cath with significant CKD and no chest pain.  Continue statin, good lipids in 8/17.  No aspirin given Eliquis use.  6. ?Claudication: Foot pain with ambulation.  May be neuropathy, but I cannot feel her pedal pulses.  I will arrange for ABIs.  7. Carotid stenosis: Repeat carotid dopplers in 9/18.  8. HTN: BP high today, but tends to run 130s to 140s at home.  Given fall history, will not push BP control further.   Followup in 1 month.   Marca AnconaDalton Lyrah Bradt 08/22/2016

## 2016-08-30 ENCOUNTER — Inpatient Hospital Stay (HOSPITAL_COMMUNITY): Admission: RE | Admit: 2016-08-30 | Payer: Medicare Other | Source: Ambulatory Visit

## 2016-08-30 ENCOUNTER — Other Ambulatory Visit: Payer: Medicare Other

## 2016-09-01 ENCOUNTER — Other Ambulatory Visit: Payer: Self-pay | Admitting: Family Medicine

## 2016-09-01 ENCOUNTER — Other Ambulatory Visit (INDEPENDENT_AMBULATORY_CARE_PROVIDER_SITE_OTHER): Payer: Medicare Other

## 2016-09-01 DIAGNOSIS — E1121 Type 2 diabetes mellitus with diabetic nephropathy: Secondary | ICD-10-CM

## 2016-09-01 DIAGNOSIS — R7989 Other specified abnormal findings of blood chemistry: Secondary | ICD-10-CM

## 2016-09-01 DIAGNOSIS — E1165 Type 2 diabetes mellitus with hyperglycemia: Principal | ICD-10-CM

## 2016-09-01 LAB — BASIC METABOLIC PANEL
BUN: 30 mg/dL — AB (ref 6–23)
CHLORIDE: 98 meq/L (ref 96–112)
CO2: 30 mEq/L (ref 19–32)
Calcium: 9.1 mg/dL (ref 8.4–10.5)
Creatinine, Ser: 2.01 mg/dL — ABNORMAL HIGH (ref 0.40–1.20)
GFR: 25.16 mL/min — AB (ref >60.00)
Glucose, Bld: 314 mg/dL — ABNORMAL HIGH (ref 70–99)
POTASSIUM: 4.7 meq/L (ref 3.5–5.1)
SODIUM: 136 meq/L (ref 135–145)

## 2016-09-07 ENCOUNTER — Other Ambulatory Visit (HOSPITAL_COMMUNITY): Payer: Self-pay | Admitting: Cardiology

## 2016-09-07 DIAGNOSIS — R0989 Other specified symptoms and signs involving the circulatory and respiratory systems: Secondary | ICD-10-CM

## 2016-09-07 DIAGNOSIS — I739 Peripheral vascular disease, unspecified: Secondary | ICD-10-CM

## 2016-09-08 ENCOUNTER — Other Ambulatory Visit (HOSPITAL_COMMUNITY): Payer: Self-pay | Admitting: *Deleted

## 2016-09-08 MED ORDER — ISOSORBIDE MONONITRATE ER 30 MG PO TB24
30.0000 mg | ORAL_TABLET | Freq: Every day | ORAL | 6 refills | Status: DC
Start: 2016-09-08 — End: 2017-03-10

## 2016-09-15 ENCOUNTER — Ambulatory Visit (HOSPITAL_COMMUNITY)
Admission: RE | Admit: 2016-09-15 | Discharge: 2016-09-15 | Disposition: A | Discharge status: Home / Self Care | Payer: Medicare Other | Source: Ambulatory Visit | Attending: Cardiovascular Disease | Admitting: Cardiovascular Disease

## 2016-09-15 DIAGNOSIS — I739 Peripheral vascular disease, unspecified: Secondary | ICD-10-CM | POA: Insufficient documentation

## 2016-09-15 DIAGNOSIS — R0789 Other chest pain: Secondary | ICD-10-CM | POA: Diagnosis not present

## 2016-09-15 DIAGNOSIS — R0989 Other specified symptoms and signs involving the circulatory and respiratory systems: Secondary | ICD-10-CM

## 2016-09-20 ENCOUNTER — Ambulatory Visit (INDEPENDENT_AMBULATORY_CARE_PROVIDER_SITE_OTHER): Payer: Medicare Other | Admitting: Family Medicine

## 2016-09-20 ENCOUNTER — Encounter: Payer: Self-pay | Admitting: Family Medicine

## 2016-09-20 VITALS — BP 120/66 | HR 90 | Temp 97.5°F | Wt 191.2 lb

## 2016-09-20 DIAGNOSIS — E1165 Type 2 diabetes mellitus with hyperglycemia: Secondary | ICD-10-CM

## 2016-09-20 DIAGNOSIS — I5022 Chronic systolic (congestive) heart failure: Secondary | ICD-10-CM | POA: Diagnosis not present

## 2016-09-20 DIAGNOSIS — R300 Dysuria: Secondary | ICD-10-CM | POA: Diagnosis not present

## 2016-09-20 DIAGNOSIS — R0602 Shortness of breath: Secondary | ICD-10-CM

## 2016-09-20 DIAGNOSIS — E1121 Type 2 diabetes mellitus with diabetic nephropathy: Secondary | ICD-10-CM

## 2016-09-20 LAB — BASIC METABOLIC PANEL
BUN: 34 mg/dL — AB (ref 6–23)
CO2: 28 mEq/L (ref 19–32)
CREATININE: 1.91 mg/dL — AB (ref 0.40–1.20)
Calcium: 9.1 mg/dL (ref 8.4–10.5)
Chloride: 96 mEq/L (ref 96–112)
GFR: 26.69 mL/min — AB (ref >60.00)
Glucose, Bld: 411 mg/dL — ABNORMAL HIGH (ref 70–99)
Potassium: 4.1 mEq/L (ref 3.5–5.1)
Sodium: 135 mEq/L (ref 135–145)

## 2016-09-20 LAB — POC URINALSYSI DIPSTICK (AUTOMATED)
KETONES UA: NEGATIVE
Nitrite, UA: NEGATIVE
RBC UA: NEGATIVE
UROBILINOGEN UA: 0.2
pH, UA: 6

## 2016-09-20 LAB — MICROALBUMIN / CREATININE URINE RATIO
Creatinine,U: 169.3 mg/dL
MICROALB UR: 10.6 mg/dL — AB (ref 0.0–1.9)
Microalb Creat Ratio: 6.3 mg/g (ref 0.0–30.0)

## 2016-09-20 LAB — BRAIN NATRIURETIC PEPTIDE: PRO B NATRI PEPTIDE: 140 pg/mL — AB (ref 0.0–100.0)

## 2016-09-20 LAB — HEMOGLOBIN A1C: HEMOGLOBIN A1C: 9 % — AB (ref 4.6–6.5)

## 2016-09-20 MED ORDER — CEPHALEXIN 250 MG PO CAPS: 250.0000 mg | ORAL_CAPSULE | Freq: Two times a day (BID) | ORAL | 0 refills | Status: DC

## 2016-09-20 MED ORDER — SIMETHICONE 80 MG PO CHEW: 80.0000 mg | CHEWABLE_TABLET | Freq: Four times a day (QID) | ORAL | 1 refills | Status: DC | PRN

## 2016-09-20 NOTE — Progress Notes (Signed)
Dysuria:yes, burning with urination.   duration of symptoms: a few days.   abdominal pain: lower abd pressure.   Fevers: no back pain: at baseline- with known OA in her back.  Vomiting: no U/a d/w pt.   DM2 She felt "funny" yesterday with a high sugar (~380) after a nap.  It may have been that she was dreaming/having a nightmare in the midst of a nap.  She didn't have a low sugar.  Family was at home at the time of the event. Sugar was 139 this AM.  Has been 120-160s in the AMs fasting typically.  Due for labs.    CHF.  Weight is up in the meantime.  She is more fatigued recently.  Still with SOB with exertion, using her walker at baseline.    She has had need for repeated doses of gas X.    Meds, vitals, and allergies reviewed.   Per HPI unless specifically indicated in ROS section   GEN: nad, alert and oriented HEENT: mucous membranes moist NECK: supple CV: rrr.  PULM: ctab, no inc wob ABD: soft, +bs, suprapubic area slightly tender EXT: no edema SKIN: no acute rash BACK: no CVA pain

## 2016-09-20 NOTE — Progress Notes (Signed)
Pre visit review using our clinic review tool, if applicable. No additional management support is needed unless otherwise documented below in the visit note. 

## 2016-09-20 NOTE — Patient Instructions (Signed)
Drink enough water to keep your urine clear and start the antibiotics today.  We'll contact you with your lab report.  Take care.   Glad to see you.

## 2016-09-21 DIAGNOSIS — R300 Dysuria: Secondary | ICD-10-CM | POA: Insufficient documentation

## 2016-09-21 NOTE — Assessment & Plan Note (Signed)
See notes on labs. We may have to adjust medications based on A1c. I'm trying to avoid low sugars. Discussed with patient. She agrees. >25 minutes spent in face to face time with patient, >50% spent in counselling or coordination of care.

## 2016-09-21 NOTE — Assessment & Plan Note (Signed)
Presumed UTI. Start Keflex. Not enough urine to send for urine culture today. Discussed with patient. Update me as needed.

## 2016-09-21 NOTE — Assessment & Plan Note (Signed)
Her weight is up. Recheck labs today. She still getting short of breath with exertion. It is unclear to me how much of her weight gain extra related to fluid versus solid tissue accumulation due to caloric excess. Discussed with patient and daughter. We'll consider changes in medications once a see her labs.

## 2016-09-22 ENCOUNTER — Telehealth: Payer: Self-pay

## 2016-09-22 NOTE — Telephone Encounter (Signed)
[name] sky ped me and ask Dr Para March to call about lab result from 09/20/16 when available.

## 2016-09-23 NOTE — Addendum Note (Signed)
Addended by: Annamarie Major on: 09/23/2016 11:15 AM   Modules accepted: Orders

## 2016-09-27 ENCOUNTER — Encounter (HOSPITAL_COMMUNITY): Payer: Medicare Other

## 2016-09-28 ENCOUNTER — Ambulatory Visit: Payer: Medicare Other | Admitting: Family Medicine

## 2016-09-28 DIAGNOSIS — Z0289 Encounter for other administrative examinations: Secondary | ICD-10-CM

## 2016-10-05 ENCOUNTER — Encounter: Payer: Medicare Other | Admitting: *Deleted

## 2016-10-05 ENCOUNTER — Telehealth: Payer: Self-pay | Admitting: Cardiology

## 2016-10-05 NOTE — Telephone Encounter (Signed)
LMOVM reminding pt to send remote transmission.   

## 2016-10-08 ENCOUNTER — Other Ambulatory Visit: Payer: Self-pay | Admitting: Family Medicine

## 2016-10-08 ENCOUNTER — Encounter: Payer: Self-pay | Admitting: Cardiology

## 2016-10-12 ENCOUNTER — Other Ambulatory Visit: Payer: Self-pay | Admitting: Cardiology

## 2016-10-14 ENCOUNTER — Other Ambulatory Visit: Payer: Self-pay | Admitting: Cardiology

## 2016-10-15 ENCOUNTER — Ambulatory Visit (INDEPENDENT_AMBULATORY_CARE_PROVIDER_SITE_OTHER): Payer: Medicare Other | Admitting: Family Medicine

## 2016-10-15 ENCOUNTER — Other Ambulatory Visit: Payer: Self-pay

## 2016-10-15 ENCOUNTER — Ambulatory Visit: Payer: Medicare Other | Admitting: Family Medicine

## 2016-10-15 ENCOUNTER — Encounter: Payer: Self-pay | Admitting: Family Medicine

## 2016-10-15 VITALS — BP 110/66 | HR 84 | Temp 97.5°F | Resp 18 | Wt 192.0 lb

## 2016-10-15 DIAGNOSIS — F32A Depression, unspecified: Secondary | ICD-10-CM

## 2016-10-15 DIAGNOSIS — J441 Chronic obstructive pulmonary disease with (acute) exacerbation: Secondary | ICD-10-CM | POA: Diagnosis not present

## 2016-10-15 DIAGNOSIS — F329 Major depressive disorder, single episode, unspecified: Secondary | ICD-10-CM

## 2016-10-15 MED ORDER — DULOXETINE HCL 20 MG PO CPEP: 20.0000 mg | ORAL_CAPSULE | Freq: Every day | ORAL | 2 refills | Status: DC

## 2016-10-15 MED ORDER — BENZONATATE 100 MG PO CAPS: 100.0000 mg | ORAL_CAPSULE | Freq: Three times a day (TID) | ORAL | 0 refills | Status: DC | PRN

## 2016-10-15 MED ORDER — ALBUTEROL SULFATE HFA 108 (90 BASE) MCG/ACT IN AERS: 2.0000 | INHALATION_SPRAY | RESPIRATORY_TRACT | 1 refills | Status: AC | PRN

## 2016-10-15 MED ORDER — AMOXICILLIN 500 MG PO CAPS: 500.0000 mg | ORAL_CAPSULE | Freq: Three times a day (TID) | ORAL | 0 refills | Status: DC

## 2016-10-15 NOTE — Telephone Encounter (Signed)
Cymbalta listed as a historical medication. Please refer to original prescriber for refills.

## 2016-10-15 NOTE — Progress Notes (Signed)
Pre visit review using our clinic review tool, if applicable. No additional management support is needed unless otherwise documented below in the visit note. 

## 2016-10-15 NOTE — Progress Notes (Signed)
Subjective:    Patient ID: Danielle Benitez, female    DOB: Jun 14, 1932, 83 y.o.   MRN: [id number]  HPI This is an 83 yo female who is brought in by her daughter, [name]- who is an employee here.  She presents today with cough x 2 weeks that has gotten worse over last 2 days. Large amount nasal drainage. Cough with yellow/white sputum. Feels like something is sitting on her chest.  Ears popping and itching. Some sore throat yesterday. No fever.  Not sure what to take for symptoms. She is fluid restricted due to CHF, not to have more than 2L, probably doesn't drink that much. Requesting refill of cymbalta at current dose.   Past Medical History:  Diagnosis Date  . Abnormality of gait   . Anemia, unspecified   . Anticoagulated- Eliquis 07/31/2015  . Anxiety state, unspecified   . Aortic stenosis    a. Mild by echo 08/2013.  Marland Kitchen Atrial fibrillation - developed 07/11/15 by PPM interrogation, recurrent after DCCV 10/14 07/11/2015    recurrent after DCCV 10/14  . Bifascicular block    afib  . Chronic systolic CHF (congestive heart failure) (HCC)    a. Dx 07/2015 - EF 35-40%, unclear etiology  . CKD (chronic kidney disease), stage III   . Depressive disorder, not elsewhere classified   . DM (diabetes mellitus), type 2, uncontrolled, with renal complications (HCC) 01/08/2012  . Eczema 11/22/2013  . Esophageal dysmotility 08/05/2015  . Essential hypertension   . Female stress incontinence 05/06/2015  . Full dentures   . GERD (gastroesophageal reflux disease)   . Gout   . HOH (hard of hearing)   . Internal hemorrhoids without mention of complication   . Lumbago   . Memory loss   . Mild CAD    a. Cath 2013: 20-30% prox RCA.  Marland Kitchen Myocardial infarction 07/2015  . Nonspecific abnormal results of liver function study   . Obesity, unspecified   . Orthostatic hypotension   . Osteoarthritis   . Other and unspecified hyperlipidemia   . Pain in joint, shoulder region 11/22/2013   left   . Personal  history of fall   . Presence of permanent cardiac pacemaker   . Pruritus 11/22/2013  . S/P cardiac pacemaker procedure, PPM Medtronic REVO placed 01/11/2012 01/11/2012   a. for symptomatic bradycardia. Followed by Dr. Ladona Ridgel.  . Shortness of breath dyspnea    with movement-  . Wears glasses    Past Surgical History:  Procedure Laterality Date  . CARDIAC CATHETERIZATION  12/03/2003   Dr Clarene Duke  . CARDIOVERSION N/A 07/25/2015   Procedure: CARDIOVERSION;  Surgeon: Pricilla Riffle, MD;  Location: [facility] ENDOSCOPY;  Service: Cardiovascular;  Laterality: N/A;  . CARDIOVERSION N/A 08/06/2015   Procedure: CARDIOVERSION;  Surgeon: Laurey Morale, MD;  Location: [facility] ENDOSCOPY;  Service: Cardiovascular;  Laterality: N/A;  . CATARACT EXTRACTION W/ INTRAOCULAR LENS  IMPLANT, BILATERAL Bilateral 03/2015-04/2015  . COLONOSCOPY     ?????  . EP IMPLANTABLE DEVICE N/A 04/15/2016   Procedure: Pacemaker Implant;  Surgeon: Marinus Maw, MD;  Location: [facility] INVASIVE CV LAB;  Service: Cardiovascular;  Laterality: N/A;  . ESOPHAGOGASTRODUODENOSCOPY N/A 08/08/2015   Procedure: ESOPHAGOGASTRODUODENOSCOPY (EGD);  Surgeon: Iva Boop, MD;  Location: [facility] ENDOSCOPY;  Service: Endoscopy;  Laterality: N/A;  . EXTREMITY CYST EXCISION     finger  . EYE SURGERY Bilateral    catartact  . INSERT / REPLACE / REMOVE PACEMAKER    . LEFT HEART  CATHETERIZATION WITH CORONARY ANGIOGRAM N/A 01/10/2012   Procedure: LEFT HEART CATHETERIZATION WITH CORONARY ANGIOGRAM;  Surgeon: Chrystie Nose, MD;  Location: [facility] CATH LAB;  Service: Cardiovascular;  Laterality: N/A;  . ORIF WRIST FRACTURE Right 04/09/2014   Procedure: OPEN REDUCTION INTERNAL FIXATION (ORIF) RIGHT DISPLACED  DISTAL RADIUS  FRACTURE;  Surgeon: Dominica Severin, MD;  Location: [facility];  Service: Orthopedics;  Laterality: Right;  . PACEMAKER LEAD REMOVAL  04/12/2016   TEMPORARY PACEMAKER INSERTION  . PACEMAKER LEAD REMOVAL  04/15/2016   Procedure: Pacemaker Lead  Removal;  Surgeon: Marinus Maw, MD;  Location: [facility] INVASIVE CV LAB;  Service: Cardiovascular;;  . PACEMAKER LEAD REMOVAL N/A 04/12/2016   Procedure: PACEMAKER LEAD REMOVAL;  Surgeon: Marinus Maw, MD;  Location: [facility] OR;  Service: Cardiovascular;  Laterality: N/A;  . PERMANENT PACEMAKER INSERTION Left 01/11/2012   Procedure: PERMANENT PACEMAKER INSERTION;  Surgeon: Thurmon Fair, MD;  Location: [facility] CATH LAB;  Service: Cardiovascular;  Laterality: Left;  . TEE WITHOUT CARDIOVERSION N/A 07/25/2015   Procedure: TRANSESOPHAGEAL ECHOCARDIOGRAM (TEE);  Surgeon: Pricilla Riffle, MD;  Location: [facility] ENDOSCOPY;  Service: Cardiovascular;  Laterality: N/A;   Family History  Problem Relation Age of Onset  . Breast cancer Mother   . Cancer Mother   . Diabetes Daughter   . Hypertension Daughter   . Cancer Brother    Social History  Substance Use Topics  . Smoking status: Never Smoker  . Smokeless tobacco: Never Used  . Alcohol use No      Review of Systems Per HPI    Objective:   Physical Exam  Constitutional: She is oriented to person, place, and time. She appears well-developed and well-nourished. No distress.  Obese.   HENT:  Head: Normocephalic and atraumatic.  Right Ear: External ear normal.  Left Ear: External ear normal.  Nose: Nose normal.  Mouth/Throat: Oropharynx is clear and moist. No oropharyngeal exudate.  Eyes: Conjunctivae are normal.  Neck: Normal range of motion. Neck supple.  Cardiovascular: Normal rate, regular rhythm and normal heart sounds.   Pulmonary/Chest: Effort normal and breath sounds normal.  Lymphadenopathy:    She has no cervical adenopathy.  Neurological: She is alert and oriented to person, place, and time.  Skin: Skin is warm and dry. She is not diaphoretic.  Psychiatric: She has a normal mood and affect. Her behavior is normal. Judgment and thought content normal.  Vitals reviewed.     BP 110/66 (BP Location: Left Arm, Patient Position: Sitting, Cuff  Size: Normal)   Pulse 84   Temp 97.5 F (36.4 C) (Oral)   Resp 18   Wt 192 lb (87.1 kg)   SpO2 97%   BMI 37.50 kg/m  Wt Readings from Last 3 Encounters:  10/15/16 192 lb (87.1 kg)  09/20/16 191 lb 4 oz (86.8 kg)  08/20/16 189 lb (85.7 kg)       Assessment & Plan:  1. COPD exacerbation (HCC) - amoxicillin (AMOXIL) 500 MG capsule; Take 1 capsule (500 mg total) by mouth 3 (three) times daily.  Dispense: 21 capsule; Refill: 0 - albuterol (PROVENTIL HFA;VENTOLIN HFA) 108 (90 Base) MCG/ACT inhaler; Inhale 2 puffs into the lungs every 4 (four) hours as needed for wheezing or shortness of breath (cough, shortness of breath or wheezing.).  Dispense: 1 Inhaler; Refill: 1 - benzonatate (TESSALON) 100 MG capsule; Take 1 capsule (100 mg total) by mouth 3 (three) times daily as needed for cough.  Dispense: 30 capsule; Refill: 0 -  RTC precautions reviewed -  Patient Instructions  For nasal congestion you can use saline nasal spray (generic is fine for all). Use saline nasal spray in nose several times a day Take plain Mucinex (generic is fine) For fever/chill/muscle aches you can take over the counter acetaminophen.  Please come back in if you are not better in 5-7 days or if you develop wheezing, shortness of breath or persistent vomiting.   2. Depression, unspecified depression type - DULoxetine (CYMBALTA) 20 MG capsule; Take 1 capsule (20 mg total) by mouth at bedtime.  Dispense: 30 capsule; Refill: 2   Olean Ree, FNP-BC  [facility], [facility]  10/15/2016 5:37 PM

## 2016-10-15 NOTE — Telephone Encounter (Signed)
Danielle Benitez pts daughter said pt has enough duloxetine to last until 10/18/16. Last refilled # 90 x 3 on 08/03/15. By Corine Shelter PA. Cannot see where Dr Para March has prescribed before but [name] thinks has discussed with Dr Para March before.  Danielle Benitez request Dr Para March to refill.[place]. I spoke with Harlin Heys FNP who saw pt today and she will refill cymbalta. Danielle Benitez voiced understanding.

## 2016-10-15 NOTE — Patient Instructions (Addendum)
For nasal congestion you can use saline nasal spray (generic is fine for all). Use saline nasal spray in nose several times a day Take plain Mucinex (generic is fine) For fever/chill/muscle aches you can take over the counter acetaminophen.  Please come back in if you are not better in 5-7 days or if you develop wheezing, shortness of breath or persistent vomiting.

## 2016-10-18 ENCOUNTER — Other Ambulatory Visit (HOSPITAL_COMMUNITY): Payer: Self-pay | Admitting: Cardiology

## 2016-10-25 ENCOUNTER — Ambulatory Visit (HOSPITAL_COMMUNITY)
Admission: RE | Admit: 2016-10-25 | Discharge: 2016-10-25 | Disposition: A | Discharge status: Home / Self Care | Payer: Medicare Other | Source: Ambulatory Visit | Attending: Cardiology | Admitting: Cardiology

## 2016-10-25 VITALS — BP 156/84 | HR 91 | Wt 191.0 lb

## 2016-10-25 DIAGNOSIS — I6529 Occlusion and stenosis of unspecified carotid artery: Secondary | ICD-10-CM | POA: Diagnosis not present

## 2016-10-25 DIAGNOSIS — I5022 Chronic systolic (congestive) heart failure: Secondary | ICD-10-CM

## 2016-10-25 DIAGNOSIS — E1122 Type 2 diabetes mellitus with diabetic chronic kidney disease: Secondary | ICD-10-CM | POA: Diagnosis not present

## 2016-10-25 DIAGNOSIS — I10 Essential (primary) hypertension: Secondary | ICD-10-CM

## 2016-10-25 DIAGNOSIS — R001 Bradycardia, unspecified: Secondary | ICD-10-CM | POA: Insufficient documentation

## 2016-10-25 DIAGNOSIS — I480 Paroxysmal atrial fibrillation: Secondary | ICD-10-CM | POA: Insufficient documentation

## 2016-10-25 DIAGNOSIS — M109 Gout, unspecified: Secondary | ICD-10-CM | POA: Insufficient documentation

## 2016-10-25 DIAGNOSIS — N183 Chronic kidney disease, stage 3 unspecified: Secondary | ICD-10-CM

## 2016-10-25 DIAGNOSIS — Z7901 Long term (current) use of anticoagulants: Secondary | ICD-10-CM | POA: Diagnosis not present

## 2016-10-25 DIAGNOSIS — D509 Iron deficiency anemia, unspecified: Secondary | ICD-10-CM | POA: Insufficient documentation

## 2016-10-25 DIAGNOSIS — E785 Hyperlipidemia, unspecified: Secondary | ICD-10-CM | POA: Diagnosis not present

## 2016-10-25 DIAGNOSIS — I2510 Atherosclerotic heart disease of native coronary artery without angina pectoris: Secondary | ICD-10-CM | POA: Diagnosis not present

## 2016-10-25 DIAGNOSIS — I350 Nonrheumatic aortic (valve) stenosis: Secondary | ICD-10-CM | POA: Insufficient documentation

## 2016-10-25 DIAGNOSIS — I130 Hypertensive heart and chronic kidney disease with heart failure and stage 1 through stage 4 chronic kidney disease, or unspecified chronic kidney disease: Secondary | ICD-10-CM | POA: Insufficient documentation

## 2016-10-25 DIAGNOSIS — I5032 Chronic diastolic (congestive) heart failure: Secondary | ICD-10-CM | POA: Insufficient documentation

## 2016-10-25 LAB — COMPREHENSIVE METABOLIC PANEL
ALT: 33 U/L (ref 14–54)
ANION GAP: 10 (ref 5–15)
AST: 60 U/L — ABNORMAL HIGH (ref 15–41)
Albumin: 3 g/dL — ABNORMAL LOW (ref 3.5–5.0)
Alkaline Phosphatase: 131 U/L — ABNORMAL HIGH (ref 38–126)
BUN: 24 mg/dL — ABNORMAL HIGH (ref 6–20)
CHLORIDE: 100 mmol/L — AB (ref 101–111)
CO2: 28 mmol/L (ref 22–32)
Calcium: 9.3 mg/dL (ref 8.9–10.3)
Creatinine, Ser: 1.72 mg/dL — ABNORMAL HIGH (ref 0.44–1.00)
GFR, EST AFRICAN AMERICAN: 31 mL/min — AB (ref >60)
GFR, EST NON AFRICAN AMERICAN: 26 mL/min — AB (ref >60)
Glucose, Bld: 224 mg/dL — ABNORMAL HIGH (ref 65–99)
POTASSIUM: 4.6 mmol/L (ref 3.5–5.1)
SODIUM: 138 mmol/L (ref 135–145)
Total Bilirubin: 0.6 mg/dL (ref 0.3–1.2)
Total Protein: 7.4 g/dL (ref 6.5–8.1)

## 2016-10-25 LAB — CBC
HEMATOCRIT: 33.4 % — AB (ref 36.0–46.0)
HEMOGLOBIN: 9.9 g/dL — AB (ref 12.0–15.0)
MCH: 25.2 pg — ABNORMAL LOW (ref 26.0–34.0)
MCHC: 29.6 g/dL — ABNORMAL LOW (ref 30.0–36.0)
MCV: 85 fL (ref 78.0–100.0)
PLATELETS: 188 10*3/uL (ref 150–400)
RBC: 3.93 MIL/uL (ref 3.87–5.11)
RDW: 17 % — ABNORMAL HIGH (ref 11.5–15.5)
WBC: 7.9 10*3/uL (ref 4.0–10.5)

## 2016-10-25 MED ORDER — FUROSEMIDE 40 MG PO TABS: 40.0000 mg | ORAL_TABLET | Freq: Two times a day (BID) | ORAL | 3 refills | Status: DC

## 2016-10-25 NOTE — Patient Instructions (Signed)
INCREASE Lasix to 40mg  twice daily.  Labs today.  Repeat labs in 10 days (bmet)  Follow up and Echo with Dr.McLean in 3 months

## 2016-10-26 NOTE — Progress Notes (Signed)
Patient ID: Danielle Benitez, female   DOB: 01-01-1932, 83 y.o.   MRN: [id number] PCP: Dr. Para March HF Cardiology: Dr. Shirlee Latch  83 yo with history of HTN, orthostatic hypotension, symptomatic bradycardia with Medtronic PPM, paroxysmal atrial fibrillation chronic systolic CHF, CKD stage III, and iron deficiency anemia presents for cardiology followup. Around the beginning of 9/16, she began to develop exertional dyspnea. She was seen in the cardiology office on 07/25/15. She was in atrial fibrillation with RVR and echo showed EF 35-40% (had been 50-55% in past). PPM interrogation showed that atrial fibrillation had begun 07/11/15, so exertional dyspnea had preceded this by about 4 weeks. She was admitted from 10/7-10/17. She was anemic with hemoglobin 8.7. She was heme negative but Fe deficient. EGD was not done. She had TEE-guided DCCV to NSR. She also had AKI with creatinine up to 3.41, but it trended back down. She was sent home in NSR. She was readmitted 10/19-10/23 with recurrent atrial fibrillation and acute on chronic systolic CHF. She was diuresed about 10 lbs. It was decided to rate control her rather than repeat cardioversion, and she was sent home. she was only at home for about a day and developed increased dyspnea so came back to the ER and was re-admitted.  She was diuresed with IV Lasix and started on amiodarone.  She underwent DCCV on 10/26 but atrial fibrillation recurred on 10/29.  More amiodarone was loaded, and she went back into NSR.   Lexiscan Cardiolite in 11/16 showed basal inferior/inferolatearl fixed defect consistent with infarction but no ischemia.    Repeat echo was done 1/17.  This showed EF 45% with normal RV size and mildly decreased systolic function.   She was admitted in 7/17 with suspected infection of her device.  She had her PPM extracted and got a temporary-permanent device with later Medtronic PPM replacement. Last echo in 8/17 showed EF 55-60% with mild LVH, normal  RV size and systolic function.   Symptoms are fairly stable, weight up 2 lbs.  Moderate exertion around the house wears her out.  She will have to sit down and rest. She has had 2 pillow orthopnea long-term. No falls recently. Feet burn, with normal ABIs probably diabetic neuropathy. No BRBPR or melena. No chest pain. SBP 140s-150s at home.   ECG: a-paced, RBBB, LAFB  Labs (7/16): LDL 48, HDL 55 Labs (10/16): K 3.9, creatinine 2.08 Labs (11/16): K 4.9, creatinine 2.5, LFTs normal, TSH normal, HCT 43.6 Labs (12/16): K 5.2, creatinine 1.73, HCT 41.9, plts 123 Labs (3/17): LFTs normal Labs (7/17): creatinine 1.87 Labs (8/17): LDL 57, HDL 50 Labs (9/17): K 4.9, creatinine 1.74, BNP 253 Labs (11/17): TSH normal, AST 67, ALT 37 Labs (12/17): K 4.1, creatinine 1.9, BNP 140  PMH: 1. HTN but also with history of orthostatic hypotension. 2. CKD stage III 3. Aortic stenosis: Mild 4. Symptomatic bradycardia s/p Medtronic PPM. - PPM infection 7/17 with device replacement.  5. Hyperlipidemia 6. Atrial fibrillation: Paroxysmal. TEE-guided DCCV in 10/16. She had DCCV again latera in 10/16 and was started on amiodarone.   - PFTs (11/16) with normal spirometry, severe diffusion defect c/w pulmonary vascular disease.  7. Type II diabetes with diabetic neuropathy.  8. Gout 9. Fe-deficiency anemia 10. Chronic diastolic CHF: Echo 11/14 with EF 50-55%. Echo (10/16) with EF 35-40%, moderate LV dilation, diffuse hypokinesis, mild AS, moderate MR, PASP 48 mmHg.  Lexiscan Cardiolite (11/16) with EF 31%, basal inferior and inferolateral fixed defect possibly consistent with infarction, no ischemia;  degree of LV hypokinesis was out of proportion to the perfusion defect.  Echo (1/17) with EF 45%, normal RV size with mildly decreased systolic function, aortic sclerosis without stenosis.  - Echo (8/17): EF 55-60%, mild LVH, normal RV size and systolic function.  11. Esophageal dysmotility 12. Carotid stenosis:  Carotid dopplers (9/17) with 40-59% LICA stenosis.  13. ABIs (12/17) normal.   SH: Lives alone but daughter is nearby.  Nonsmoker. No ETOH.    FH: No premature CAD.   ROS: All systems reviewed and negative except as per HPI.    Current Outpatient Prescriptions  Medication Sig Dispense Refill  . acetaminophen (TYLENOL) 325 MG tablet Take 1-2 tablets (325-650 mg total) by mouth 3 (three) times daily as needed.    Marland Kitchen albuterol (PROVENTIL HFA;VENTOLIN HFA) 108 (90 Base) MCG/ACT inhaler Inhale 2 puffs into the lungs every 4 (four) hours as needed for wheezing or shortness of breath (cough, shortness of breath or wheezing.). 1 Inhaler 1  . allopurinol (ZYLOPRIM) 300 MG tablet TAKE 1 TABLET BY MOUTH DAILY 90 tablet 1  . amiodarone (PACERONE) 200 MG tablet Take 1 tablet (200 mg total) by mouth daily. 90 tablet 3  . atorvastatin (LIPITOR) 40 MG tablet TAKE 1/2 TABLET BY MOUTH ONCE DAILY AT 6PM 90 tablet 0  . benzonatate (TESSALON) 100 MG capsule Take 1 capsule (100 mg total) by mouth 3 (three) times daily as needed for cough. 30 capsule 0  . cetirizine (ZYRTEC) 10 MG tablet Take 10 mg by mouth daily.    . DULoxetine (CYMBALTA) 20 MG capsule Take 1 capsule (20 mg total) by mouth at bedtime. 30 capsule 2  . ELIQUIS 5 MG TABS tablet Take 0.5 tablets by mouth 2 (two) times daily.    . fluticasone (FLONASE) 50 MCG/ACT nasal spray Place 1 spray into both nostrils daily as needed for allergies or rhinitis.    . furosemide (LASIX) 40 MG tablet Take 1 tablet (40 mg total) by mouth 2 (two) times daily. 60 tablet 3  . glimepiride (AMARYL) 1 MG tablet TAKE 1 TABLET BY MOUTH DAILY WITH BREAKFAST 90 tablet 1  . isosorbide mononitrate (IMDUR) 30 MG 24 hr tablet Take 1 tablet (30 mg total) by mouth daily. 30 tablet 6  . metoprolol succinate (TOPROL-XL) 50 MG 24 hr tablet TAKE 1 TABLET BY MOUTH TWICE A DAY 60 tablet 3  . Multiple Vitamins-Minerals (ONE-A-DAY 50 PLUS PO) Take 1 capsule by mouth daily.    . ondansetron  (ZOFRAN ODT) 4 MG disintegrating tablet Take 1 tablet (4 mg total) by mouth every 8 (eight) hours as needed for nausea or vomiting. 20 tablet 0  . ONE TOUCH ULTRA TEST test strip USE TO CHECK BLOOD SUGAR ONCE A DAY AS DIRECTED 100 each 3  . ONGLYZA 2.5 MG TABS tablet TAKE 1 TABLET BY MOUTH DAILY 30 tablet 5  . pantoprazole (PROTONIX) 40 MG tablet TAKE 1 TABLET BY MOUTH DAILY 30 tablet 5  . senna-docusate (SENOKOT-S) 8.6-50 MG tablet Take 1 tablet by mouth daily as needed for mild constipation.    . simethicone (GAS-X) 80 MG chewable tablet Chew 1 tablet (80 mg total) by mouth every 6 (six) hours as needed (for gas). 30 tablet 1  . traZODone (DESYREL) 50 MG tablet Take 0.5-1 tablets (25-50 mg total) by mouth at bedtime as needed for sleep. Don't take >50mg  in a day. 30 tablet 3   No current facility-administered medications for this encounter.    BP (!) 156/84 (BP  Location: Left Arm, Patient Position: Sitting, Cuff Size: Normal)   Pulse 91   Wt 191 lb (86.6 kg)   SpO2 100%   BMI 37.30 kg/m  General: NAD Neck: JVP 8-9 cm, no thyromegaly or thyroid nodule.  Lungs: Clear to auscultation bilaterally with normal respiratory effort. CV: Nondisplaced PMI.  Heart regular S1/S2, no S3/S4, no murmur.  1+ ankle edema.  No carotid bruit.  Normal pedal pulses.  Abdomen: Soft, nontender, no hepatosplenomegaly, no distention.  Skin: Intact without lesions or rashes.  Neurologic: Alert and oriented x 3.  Psych: Normal affect. Extremities: No clubbing or cyanosis.  HEENT: Normal.   Assessment/Plan: 1. Chronic diastolic CHF: EF 40-98% by echo 10/16. Etiology uncertain: could be tachy-mediated cardiomyopathy, but symptoms seemed to predate the onset of atrial fibrillation. Possible viral myocarditis. Cannot rule out ischemic cardiomyopathy, but no definite anginal symptoms. Cardiolite in 11/16 showed a small area possibly of prior infarction but EF and wall motion abnormalities were out of proportion to  the perfusion defect. By 8/17, EF was improved to 55-60%.  NYHA class III symptoms today with ongoing volume overload on exam and weight gain.   - Increase Lasix to 40 mg bid, BMET today and again in 10 days.   - Continue current Toprol XL.  - I will repeat echo to make sure that EF remains normalized.  2. Atrial fibrillation: Tolerates poorly. NSR today.   - Continue Eliquis - Toprol XL 50 mg bid.  - Continue amiodarone for maintenance of NSR. Check TSH and LFTs today.  She will need a regular eye exam.   3. CKD Stage III: BMET today.  4. Medtronic PPM for symptomatic bradycardia.   5. CAD: Possible old infarction on Cardiolite.  No ischemia.  Would not cath with significant CKD and no chest pain.  Continue statin, good lipids in 8/17.  No aspirin given Eliquis use.  6. ?Claudication: Foot pain with ambulation.  With normal ABIs, suspect foot pain/burning is diabetic neuropathy.  7. Carotid stenosis: Repeat carotid dopplers in 9/18.  8. HTN: BP mildly elevated.  Given significant fall history, will not push BP control further.   Followup in 3 months.   [name] 10/26/2016

## 2016-11-06 ENCOUNTER — Other Ambulatory Visit: Payer: Self-pay | Admitting: Family Medicine

## 2016-11-08 ENCOUNTER — Telehealth: Payer: Self-pay

## 2016-11-08 ENCOUNTER — Other Ambulatory Visit (INDEPENDENT_AMBULATORY_CARE_PROVIDER_SITE_OTHER): Payer: Medicare Other

## 2016-11-08 DIAGNOSIS — I5022 Chronic systolic (congestive) heart failure: Secondary | ICD-10-CM | POA: Diagnosis not present

## 2016-11-08 LAB — BASIC METABOLIC PANEL
BUN: 23 mg/dL (ref 6–23)
CALCIUM: 9.3 mg/dL (ref 8.4–10.5)
CO2: 28 mEq/L (ref 19–32)
Chloride: 96 mEq/L (ref 96–112)
Creatinine, Ser: 1.84 mg/dL — ABNORMAL HIGH (ref 0.40–1.20)
GFR: 27.85 mL/min — ABNORMAL LOW (ref >60.00)
Glucose, Bld: 384 mg/dL — ABNORMAL HIGH (ref 70–99)
Potassium: 4.5 mEq/L (ref 3.5–5.1)
Sodium: 134 mEq/L — ABNORMAL LOW (ref 135–145)

## 2016-11-08 NOTE — Addendum Note (Signed)
Addended by: Alvina Chou on: 11/08/2016 03:05 PM   Modules accepted: Orders

## 2016-11-08 NOTE — Telephone Encounter (Signed)
PLEASE NOTE: All timestamps contained within this report are represented as [place] Standard Time. CONFIDENTIALTY NOTICE: This fax transmission is intended only for the addressee. It contains information that is legally privileged, confidential or otherwise protected from use or disclosure. If you are not the intended recipient, you are strictly prohibited from reviewing, disclosing, copying using or disseminating any of this information or taking any action in reliance on or regarding this information. If you have received this fax in error, please notify us immediately by telephone so that we can arrange for its return to [place]. Phone: [phone], Toll-Free: [phone], Fax: [phone] Page: 1 of 2 Call Id: [id number] [facility] Night - Client TELEPHONE ADVICE RECORD [facility] Call Center Patient Name: Danielle Benitez Gender: Female DOB: May 05, 1932 Age: 83 Y 10 M 17 D Return Phone Number: [phone] (Primary) City/State/Zip: [place] Client [facility] Night - Client Client Site [facility] [place] - Night Physician Raechel Ache - MD Who Is Calling Patient / Member / Family / Caregiver Call Type Triage / Clinical Caller Name Colin Mulders Relationship To Patient Daughter Return Phone Number [phone] (Primary) Chief Complaint Head Injury (non urgent symptom) Reason for Call Symptomatic / Request for Health Information Initial Comment Caller states mother fell at 8 pm tonight. Getting up out of chair and has a recliner that turns; it turned and she fell backwards and hit head on end table. No bump, broken skin, no bruise. Takes Eliquis. No neuro symptoms or confusion. No neck pain, vomiting. No pain at all. Caller was advised to go to the ED but mother does not wnat to go and is wondering if the on-call can order a MRI so her mom doesn't get exposed to all the sickness going around. Nurse Assessment Nurse: Vonita Moss, RN,  Doug Date/Time (Eastern Time): 11/06/2016 12:50:49 AM Confirm and document reason for call. If symptomatic, describe symptoms. ---caller states that mom fell backwards from her chair and bumped her head on the table , Does the PT have any chronic conditions? (i.e. diabetes, asthma, etc.) ---Yes List chronic conditions. ---A fib , chf , kidney disease, diabetes Guidelines Guideline Title Affirmed Question Head Injury ALSO, superficial cut (scratch) or abrasion (scrape) is present Disp. Time Lamount Cohen Time) Disposition Final User 11/06/2016 1:09:24 AM Home Care Yes Vonita Moss, RN, [facility] Advice Given Per Guideline HOME CARE: You should be able to treat this at home. REASSURANCE: It sounds like a small cut or scrape that we can treat at home. TETANUS SHOT FOR CLEAN CUTS AND SCRAPES: * If your last tetanus shot was over 10 years ago, then you will probably need a booster shot. * Call your PCP during regular office hours. Try to get your tetanus booster within 3 days. CALL BACK IF: * Dirt in the wound persists after scrubbing * Looks infected (pus, redness) * Doesn't heal within 10 days * You become worse. CARE ADVICE given per Head Injury (Adult) guideline. PLEASE NOTE: All timestamps contained within this report are represented as [place] Standard Time. CONFIDENTIALTY NOTICE: This fax transmission is intended only for the addressee. It contains information that is legally privileged, confidential or otherwise protected from use or disclosure. If you are not the intended recipient, you are strictly prohibited from reviewing, disclosing, copying using or disseminating any of this information or taking any action in reliance on or regarding this information. If you have received this fax in error, please notify us immediately by telephone so that we can  arrange for its return to [place]. Phone: [phone], Toll-Free: [phone], Fax: [phone] Page: 2 of 2 Call Id: [id number]

## 2016-11-08 NOTE — Telephone Encounter (Signed)
I spoke with [name] and pt is doing OK; pt does not think she needs to be seen. [name] will cb if needed. FYI to Dr Para March.

## 2016-11-08 NOTE — Telephone Encounter (Signed)
Noted. Thanks.

## 2016-11-08 NOTE — Telephone Encounter (Signed)
PLEASE NOTE: All timestamps contained within this report are represented as [place] Standard Time. CONFIDENTIALTY NOTICE: This fax transmission is intended only for the addressee. It contains information that is legally privileged, confidential or otherwise protected from use or disclosure. If you are not the intended recipient, you are strictly prohibited from reviewing, disclosing, copying using or disseminating any of this information or taking any action in reliance on or regarding this information. If you have received this fax in error, please notify us immediately by telephone so that we can arrange for its return to [place]. Phone: [phone], Toll-Free: [phone], Fax: [phone] Page: 1 of 1 Call Id: [id number] [facility] Night - Client TELEPHONE ADVICE RECORD [facility] Call Center Patient Name: Danielle Benitez Gender: Female DOB: 1931/12/23 Age: 10 Y 10 M 17 D Return Phone Number: [phone] (Primary) City/State/Zip: [place] Client [facility] [facility] Night - Client Client Site [facility] [place] - Night Physician Raechel Ache - MD Who Is Calling Patient / Member / Family / Caregiver Call Type Triage / Clinical Caller Name Danielle Benitez Relationship To Patient Daughter Return Phone Number [phone] (Primary) Chief Complaint Head Injury (non urgent symptom) Reason for Call Symptomatic / Request for Health Information Initial Comment Her mother fell and bumped her head. There isn't any broken skin or bleeding. Is currently taking blood thinners. Nurse Assessment Nurse: Karlene Lineman RN, Bonita Quin Date/Time (Eastern Time): 11/05/2016 10:15:08 PM Confirm and document reason for call. If symptomatic, describe symptoms. ---Caller states mother fell at 8 pm tonight. Getting up out of chair and has a recliner that turns; it turned and she fell backwards and hit head on end table. No bump, broken skin, no bruise. Takes Eliquis. No neuro  symptoms or confusion. No neck pain, vomiting. No pain at all. Eating ice cream now. Does the PT have any chronic conditions? (i.e. diabetes, asthma, etc.) ---Yes List chronic conditions. ---diabetic chf stage 3 kidney disease htn gout Guidelines Guideline Title Affirmed Question Head Injury Taking Coumadin (warfarin) or other strong blood thinner, or known bleeding disorder (e.g., thrombocytopenia) Disp. Time Lamount Cohen Time) Disposition Final User 11/05/2016 10:22:14 PM Go to ED Now (or PCP triage) Yes Karlene Lineman, RN, Bonita Quin Referrals [facility] - ED Care Advice Given Per Guideline GO TO ED NOW (OR PCP TRIAGE): BLEEDING: If there is a scrape or cut, wash it off with soap and water. Then apply pressure with a sterile gauze for 10 minutes to stop any bleeding. CARE ADVICE given per Head Injury (Adult) guideline.

## 2016-11-09 ENCOUNTER — Ambulatory Visit (INDEPENDENT_AMBULATORY_CARE_PROVIDER_SITE_OTHER): Payer: Medicare Other | Admitting: *Deleted

## 2016-11-09 DIAGNOSIS — R001 Bradycardia, unspecified: Secondary | ICD-10-CM | POA: Diagnosis not present

## 2016-11-11 ENCOUNTER — Encounter: Payer: Self-pay | Admitting: Cardiology

## 2016-11-11 NOTE — Progress Notes (Signed)
Remote pacemaker transmission.   

## 2016-11-14 LAB — CUP PACEART REMOTE DEVICE CHECK
Battery Voltage: 3.02 V
Brady Statistic AP VP Percent: 0.29 %
Brady Statistic AS VP Percent: 0 %
Brady Statistic RA Percent Paced: 99.91 %
Date Time Interrogation Session: 20180131040821
Implantable Lead Implant Date: 20170706
Implantable Lead Location: 753860
Implantable Lead Model: 5076
Implantable Pulse Generator Implant Date: 20170706
Lead Channel Impedance Value: 589 Ohm
Lead Channel Pacing Threshold Amplitude: 1 V
Lead Channel Pacing Threshold Pulse Width: 0.4 ms
Lead Channel Pacing Threshold Pulse Width: 0.4 ms
Lead Channel Sensing Intrinsic Amplitude: 5 mV
Lead Channel Setting Pacing Amplitude: 2 V
Lead Channel Setting Pacing Pulse Width: 0.4 ms
MDC IDC LEAD IMPLANT DT: 20170706
MDC IDC LEAD LOCATION: 753859
MDC IDC MSMT BATTERY REMAINING LONGEVITY: 111 mo
MDC IDC MSMT LEADCHNL RA IMPEDANCE VALUE: 380 Ohm
MDC IDC MSMT LEADCHNL RA IMPEDANCE VALUE: 494 Ohm
MDC IDC MSMT LEADCHNL RA SENSING INTR AMPL: 3 mV
MDC IDC MSMT LEADCHNL RA SENSING INTR AMPL: 3 mV
MDC IDC MSMT LEADCHNL RV IMPEDANCE VALUE: 646 Ohm
MDC IDC MSMT LEADCHNL RV PACING THRESHOLD AMPLITUDE: 0.875 V
MDC IDC MSMT LEADCHNL RV SENSING INTR AMPL: 5 mV
MDC IDC SET LEADCHNL RV PACING AMPLITUDE: 2.5 V
MDC IDC SET LEADCHNL RV SENSING SENSITIVITY: 2.8 mV
MDC IDC STAT BRADY AP VS PERCENT: 99.7 %
MDC IDC STAT BRADY AS VS PERCENT: 0.01 %
MDC IDC STAT BRADY RV PERCENT PACED: 0.42 %

## 2016-11-17 ENCOUNTER — Telehealth: Payer: Self-pay

## 2016-11-17 NOTE — Telephone Encounter (Signed)
Morrie Sheldon RN with case mgr [employer] left v/m; requesting most recent BP and P at [facility]. Left v/m per confidential v/m that on 10/15/16 pt seen and BP 110/66 and P 84.

## 2016-11-24 ENCOUNTER — Other Ambulatory Visit (HOSPITAL_COMMUNITY): Payer: Self-pay | Admitting: Cardiology

## 2016-11-24 ENCOUNTER — Other Ambulatory Visit: Payer: Self-pay | Admitting: Family Medicine

## 2016-11-24 DIAGNOSIS — F32A Depression, unspecified: Secondary | ICD-10-CM

## 2016-11-24 DIAGNOSIS — F329 Major depressive disorder, single episode, unspecified: Secondary | ICD-10-CM

## 2016-12-02 ENCOUNTER — Other Ambulatory Visit: Payer: Self-pay | Admitting: Family Medicine

## 2016-12-06 ENCOUNTER — Telehealth (HOSPITAL_COMMUNITY): Payer: Self-pay | Admitting: *Deleted

## 2016-12-06 NOTE — Telephone Encounter (Signed)
Patient's daughter called in saying pt had gained 5 lbs overnight and having increased shortness of breath.  Pt has been taking all medications as prescribed with no missed doses.   I spoke with Tonye Becket, NP and she advises patient to take an extra of 40 mg of lasix for the next two days.    I called daughter back and she is agreeable with plan and will call us back on Wednesday if patient doesn't show any improvement.

## 2016-12-08 DIAGNOSIS — H2513 Age-related nuclear cataract, bilateral: Secondary | ICD-10-CM | POA: Diagnosis not present

## 2016-12-08 DIAGNOSIS — E119 Type 2 diabetes mellitus without complications: Secondary | ICD-10-CM | POA: Diagnosis not present

## 2016-12-08 LAB — HM DIABETES EYE EXAM

## 2016-12-09 ENCOUNTER — Other Ambulatory Visit: Payer: Self-pay | Admitting: Family Medicine

## 2016-12-09 DIAGNOSIS — F329 Major depressive disorder, single episode, unspecified: Secondary | ICD-10-CM

## 2016-12-09 DIAGNOSIS — F32A Depression, unspecified: Secondary | ICD-10-CM

## 2016-12-15 ENCOUNTER — Encounter: Payer: Self-pay | Admitting: Family Medicine

## 2016-12-27 ENCOUNTER — Other Ambulatory Visit: Payer: Self-pay | Admitting: Family Medicine

## 2016-12-27 ENCOUNTER — Other Ambulatory Visit: Payer: Self-pay | Admitting: Physician Assistant

## 2016-12-27 DIAGNOSIS — I350 Nonrheumatic aortic (valve) stenosis: Secondary | ICD-10-CM

## 2016-12-27 DIAGNOSIS — N183 Chronic kidney disease, stage 3 unspecified: Secondary | ICD-10-CM

## 2016-12-27 DIAGNOSIS — I4891 Unspecified atrial fibrillation: Secondary | ICD-10-CM

## 2016-12-27 DIAGNOSIS — I3139 Other pericardial effusion (noninflammatory): Secondary | ICD-10-CM

## 2016-12-27 DIAGNOSIS — I10 Essential (primary) hypertension: Secondary | ICD-10-CM

## 2016-12-27 DIAGNOSIS — I313 Pericardial effusion (noninflammatory): Secondary | ICD-10-CM

## 2016-12-27 DIAGNOSIS — R0602 Shortness of breath: Secondary | ICD-10-CM

## 2016-12-27 DIAGNOSIS — I5021 Acute systolic (congestive) heart failure: Secondary | ICD-10-CM

## 2016-12-28 MED ORDER — APIXABAN 2.5 MG PO TABS: 2.5000 mg | ORAL_TABLET | Freq: Two times a day (BID) | ORAL | 1 refills | Status: DC

## 2016-12-28 NOTE — Telephone Encounter (Signed)
Pt last seen Cardiologist 10/2016-Dr. Shirlee Latch. Per med list pt is taking 1/2 tablet of 5mg s tablet which is correct dosage. Age 53yrs, Wt 87.1 Kg, and SCr 1.84.

## 2016-12-28 NOTE — Telephone Encounter (Signed)
Spoke with daughter she does want 2.5mg  tablet of Eliquis called in not the original 5mg  tablet.

## 2017-01-02 ENCOUNTER — Telehealth: Payer: Self-pay | Admitting: Physician Assistant

## 2017-01-02 MED ORDER — APIXABAN 2.5 MG PO TABS: 2.5000 mg | ORAL_TABLET | Freq: Two times a day (BID) | ORAL | 0 refills | Status: DC

## 2017-01-02 NOTE — Telephone Encounter (Signed)
Patient's daughter called answering service - she called for refill of apixaban last week and it was sent in electronically to [facility] on 12/28/16. Daughter picked up a few prescriptions from there on Friday and didn't realize that the apixaban wasn't one of the ones they gave her. She's requesting 3 tablets be sent in to [facility] [place] on [address] until she can go back to [place] and pick up the rx. I did so - apixaban 2.5mg  BID #3 with zero refills per her request (pt is >80 and Cr >1.5). She verbalized gratitude. Dayna Hecker PA-C

## 2017-01-05 ENCOUNTER — Other Ambulatory Visit: Payer: Self-pay | Admitting: Family Medicine

## 2017-01-05 DIAGNOSIS — F32A Depression, unspecified: Secondary | ICD-10-CM

## 2017-01-05 DIAGNOSIS — F329 Major depressive disorder, single episode, unspecified: Secondary | ICD-10-CM

## 2017-01-05 NOTE — Telephone Encounter (Signed)
Last refill 10/15/16 #30+ 2rfls, last OV 09/20/16. OK to refill?

## 2017-01-06 NOTE — Telephone Encounter (Signed)
Sent. Thanks.   

## 2017-01-10 ENCOUNTER — Telehealth (HOSPITAL_COMMUNITY): Payer: Self-pay | Admitting: *Deleted

## 2017-01-10 NOTE — Telephone Encounter (Signed)
[name] daughter called traige line and reported that pt is having increased SOB while ambulating over the past 2-3 weeks.  She stated that walking as little as 25 ft she becomes very SOB and it takes her awhile to recover.  She has only gained 1 lb overnight, no coughing or swelling.  Daughter is asking if she should give her an albuterol treatment that was prescribed by another office?    I will send to Maxine Glenn, PA and call her back.

## 2017-01-11 NOTE — Telephone Encounter (Signed)
Pt should try her albuterol if weight is stable and no edema.  It is an as needed medication and she does not need to consult a physician prior to using it.    She is due for follow up. Can you put her on PA/NP side NEXT week?  Thanks.   Casimiro Needle [address]" [place], PA-C 01/11/2017 10:03 AM

## 2017-01-11 NOTE — Telephone Encounter (Signed)
Called daughter back and she will give her the albuterol treatment and I have made her an appt on the PA/NP side for next week. Educated on signs and symptoms that would warrant the need to go to the emergency room and daughter verbalized understanding. No further questions at this time.

## 2017-01-20 ENCOUNTER — Ambulatory Visit (HOSPITAL_COMMUNITY)
Admission: RE | Admit: 2017-01-20 | Discharge: 2017-01-20 | Disposition: A | Discharge status: Home / Self Care | Payer: Medicare Other | Source: Ambulatory Visit | Attending: Cardiology | Admitting: Cardiology

## 2017-01-20 ENCOUNTER — Encounter (HOSPITAL_COMMUNITY): Payer: Self-pay

## 2017-01-20 ENCOUNTER — Inpatient Hospital Stay (HOSPITAL_COMMUNITY): Admission: RE | Admit: 2017-01-20 | Payer: Medicare Other | Source: Ambulatory Visit

## 2017-01-20 VITALS — BP 110/72 | HR 81 | Wt 190.4 lb

## 2017-01-20 DIAGNOSIS — Z7984 Long term (current) use of oral hypoglycemic drugs: Secondary | ICD-10-CM | POA: Insufficient documentation

## 2017-01-20 DIAGNOSIS — I6529 Occlusion and stenosis of unspecified carotid artery: Secondary | ICD-10-CM | POA: Insufficient documentation

## 2017-01-20 DIAGNOSIS — E1122 Type 2 diabetes mellitus with diabetic chronic kidney disease: Secondary | ICD-10-CM | POA: Insufficient documentation

## 2017-01-20 DIAGNOSIS — E1140 Type 2 diabetes mellitus with diabetic neuropathy, unspecified: Secondary | ICD-10-CM | POA: Diagnosis not present

## 2017-01-20 DIAGNOSIS — R001 Bradycardia, unspecified: Secondary | ICD-10-CM | POA: Diagnosis not present

## 2017-01-20 DIAGNOSIS — I350 Nonrheumatic aortic (valve) stenosis: Secondary | ICD-10-CM | POA: Insufficient documentation

## 2017-01-20 DIAGNOSIS — I951 Orthostatic hypotension: Secondary | ICD-10-CM | POA: Insufficient documentation

## 2017-01-20 DIAGNOSIS — R0602 Shortness of breath: Secondary | ICD-10-CM

## 2017-01-20 DIAGNOSIS — I5022 Chronic systolic (congestive) heart failure: Secondary | ICD-10-CM | POA: Diagnosis not present

## 2017-01-20 DIAGNOSIS — I10 Essential (primary) hypertension: Secondary | ICD-10-CM | POA: Diagnosis not present

## 2017-01-20 DIAGNOSIS — K224 Dyskinesia of esophagus: Secondary | ICD-10-CM | POA: Diagnosis not present

## 2017-01-20 DIAGNOSIS — N183 Chronic kidney disease, stage 3 unspecified: Secondary | ICD-10-CM

## 2017-01-20 DIAGNOSIS — I480 Paroxysmal atrial fibrillation: Secondary | ICD-10-CM | POA: Diagnosis not present

## 2017-01-20 DIAGNOSIS — Z79899 Other long term (current) drug therapy: Secondary | ICD-10-CM

## 2017-01-20 DIAGNOSIS — D509 Iron deficiency anemia, unspecified: Secondary | ICD-10-CM | POA: Insufficient documentation

## 2017-01-20 DIAGNOSIS — I2510 Atherosclerotic heart disease of native coronary artery without angina pectoris: Secondary | ICD-10-CM | POA: Insufficient documentation

## 2017-01-20 DIAGNOSIS — I5032 Chronic diastolic (congestive) heart failure: Secondary | ICD-10-CM | POA: Insufficient documentation

## 2017-01-20 DIAGNOSIS — M109 Gout, unspecified: Secondary | ICD-10-CM | POA: Insufficient documentation

## 2017-01-20 DIAGNOSIS — E785 Hyperlipidemia, unspecified: Secondary | ICD-10-CM | POA: Insufficient documentation

## 2017-01-20 DIAGNOSIS — I130 Hypertensive heart and chronic kidney disease with heart failure and stage 1 through stage 4 chronic kidney disease, or unspecified chronic kidney disease: Secondary | ICD-10-CM | POA: Diagnosis not present

## 2017-01-20 DIAGNOSIS — I4510 Unspecified right bundle-branch block: Secondary | ICD-10-CM | POA: Insufficient documentation

## 2017-01-20 LAB — BASIC METABOLIC PANEL
ANION GAP: 11 (ref 5–15)
BUN: 29 mg/dL — ABNORMAL HIGH (ref 6–20)
CHLORIDE: 98 mmol/L — AB (ref 101–111)
CO2: 27 mmol/L (ref 22–32)
CREATININE: 1.85 mg/dL — AB (ref 0.44–1.00)
Calcium: 8.9 mg/dL (ref 8.9–10.3)
GFR calc non Af Amer: 24 mL/min — ABNORMAL LOW (ref >60)
GFR, EST AFRICAN AMERICAN: 28 mL/min — AB (ref >60)
Glucose, Bld: 252 mg/dL — ABNORMAL HIGH (ref 65–99)
Potassium: 4.4 mmol/L (ref 3.5–5.1)
SODIUM: 136 mmol/L (ref 135–145)

## 2017-01-20 LAB — CBC
HCT: 33.4 % — ABNORMAL LOW (ref 36.0–46.0)
HEMOGLOBIN: 10.3 g/dL — AB (ref 12.0–15.0)
MCH: 26.3 pg (ref 26.0–34.0)
MCHC: 30.8 g/dL (ref 30.0–36.0)
MCV: 85.2 fL (ref 78.0–100.0)
Platelets: 144 10*3/uL — ABNORMAL LOW (ref 150–400)
RBC: 3.92 MIL/uL (ref 3.87–5.11)
RDW: 19.3 % — ABNORMAL HIGH (ref 11.5–15.5)
WBC: 7.3 10*3/uL (ref 4.0–10.5)

## 2017-01-20 LAB — BRAIN NATRIURETIC PEPTIDE: B NATRIURETIC PEPTIDE 5: 197.7 pg/mL — AB (ref 0.0–100.0)

## 2017-01-20 NOTE — Patient Instructions (Signed)
Labs today  Your physician has requested that you have an echocardiogram. Echocardiography is a painless test that uses sound waves to create images of your heart. It provides your doctor with information about the size and shape of your heart and how well your heart's chambers and valves are working. This procedure takes approximately one hour. There are no restrictions for this procedure.  Your physician recommends that you schedule a follow-up appointment in: 2 months  

## 2017-01-20 NOTE — Progress Notes (Addendum)
Patient ID: Danielle Benitez, female   DOB: 05-Apr-1932, 83 y.o.   MRN: [id number] PCP: Dr. Para March HF Cardiology: Dr. Shirlee Latch EP: Dr Ladona Ridgel   83 yo with history of HTN, orthostatic hypotension, symptomatic bradycardia with Medtronic PPM, paroxysmal atrial fibrillation chronic systolic CHF, CKD stage III, and iron deficiency anemia presents for cardiology followup. Around the beginning of 9/16, she began to develop exertional dyspnea. She was seen in the cardiology office on 07/25/15. She was in atrial fibrillation with RVR and echo showed EF 35-40% (had been 50-55% in past). PPM interrogation showed that atrial fibrillation had begun 07/11/15, so exertional dyspnea had preceded this by about 4 weeks. She was admitted from 10/7-10/17. She was anemic with hemoglobin 8.7. She was heme negative but Fe deficient. EGD was not done. She had TEE-guided DCCV to NSR. She also had AKI with creatinine up to 3.41, but it trended back down. She was sent home in NSR. She was readmitted 10/19-10/23 with recurrent atrial fibrillation and acute on chronic systolic CHF. She was diuresed about 10 lbs. It was decided to rate control her rather than repeat cardioversion, and she was sent home. she was only at home for about a day and developed increased dyspnea so came back to the ER and was re-admitted.  She was diuresed with IV Lasix and started on amiodarone.  She underwent DCCV on 10/26 but atrial fibrillation recurred on 10/29.  More amiodarone was loaded, and she went back into NSR.   Lexiscan Cardiolite in 11/16 showed basal inferior/inferolatearl fixed defect consistent with infarction but no ischemia.    Repeat echo was done 1/17.  This showed EF 45% with normal RV size and mildly decreased systolic function.   She was admitted in 7/17 with suspected infection of her device.  She had her PPM extracted and got a temporary-permanent device with later Medtronic PPM replacement. Last echo in 8/17 showed EF 55-60% with  mild LVH, normal RV size and systolic function.   Today she returns for HF follow up. Overall feeling ok. Complains SOB with exertion and fatigue. Denies PND/Orthopnea. Walks with rolling walker. Limited mobility.  Denies BRBPR. Had a fall last week. Mechanical fall trying to balance on one foot. Weight at home 189-191 pounds. She has not taken extra lasix in the last few weeks. Talking all medications. Lives at home with her daughter.     Labs (7/16): LDL 48, HDL 55 Labs (10/16): K 3.9, creatinine 2.08 Labs (11/16): K 4.9, creatinine 2.5, LFTs normal, TSH normal, HCT 43.6 Labs (12/16): K 5.2, creatinine 1.73, HCT 41.9, plts 123 Labs (3/17): LFTs normal Labs (7/17): creatinine 1.87 Labs (8/17): LDL 57, HDL 50 Labs (9/17): K 4.9, creatinine 1.74, BNP 253 Labs (11/17): TSH normal, AST 67, ALT 37 Labs (12/17): K 4.1, creatinine 1.9, BNP 140 Labs (11/08/2016): K 4.5  Labs 94/09/2017): K 4.4 Creatinine 1.85  PMH: 1. HTN but also with history of orthostatic hypotension. 2. CKD stage III 3. Aortic stenosis: Mild 4. Symptomatic bradycardia s/p Medtronic PPM. - PPM infection 7/17 with device replacement.  5. Hyperlipidemia 6. Atrial fibrillation: Paroxysmal. TEE-guided DCCV in 10/16. She had DCCV again latera in 10/16 and was started on amiodarone.   - PFTs (11/16) with normal spirometry, severe diffusion defect c/w pulmonary vascular disease.  7. Type II diabetes with diabetic neuropathy.  8. Gout 9. Fe-deficiency anemia 10. Chronic diastolic CHF: Echo 11/14 with EF 50-55%. Echo (10/16) with EF 35-40%, moderate LV dilation, diffuse hypokinesis, mild AS, moderate MR,  PASP 48 mmHg.  Lexiscan Cardiolite (11/16) with EF 31%, basal inferior and inferolateral fixed defect possibly consistent with infarction, no ischemia; degree of LV hypokinesis was out of proportion to the perfusion defect.  Echo (1/17) with EF 45%, normal RV size with mildly decreased systolic function, aortic sclerosis without  stenosis.  - Echo (8/17): EF 55-60%, mild LVH, normal RV size and systolic function.  11. Esophageal dysmotility 12. Carotid stenosis: Carotid dopplers (9/17) with 40-59% LICA stenosis.  13. ABIs (12/17) normal.   SH: Lives alone but daughter is nearby.  Nonsmoker. No ETOH.    FH: No premature CAD.   ROS: All systems reviewed and negative except as per HPI.    Current Outpatient Prescriptions  Medication Sig Dispense Refill  . acetaminophen (TYLENOL) 325 MG tablet Take 1-2 tablets (325-650 mg total) by mouth 3 (three) times daily as needed.    Marland Kitchen albuterol (PROVENTIL HFA;VENTOLIN HFA) 108 (90 Base) MCG/ACT inhaler Inhale 2 puffs into the lungs every 4 (four) hours as needed for wheezing or shortness of breath (cough, shortness of breath or wheezing.). 1 Inhaler 1  . allopurinol (ZYLOPRIM) 300 MG tablet TAKE 1 TABLET BY MOUTH DAILY 90 tablet 1  . amiodarone (PACERONE) 200 MG tablet TAKE 1 TABLET BY MOUTH DAILY 90 tablet 0  . apixaban (ELIQUIS) 2.5 MG TABS tablet Take 1 tablet (2.5 mg total) by mouth 2 (two) times daily. 3 tablet 0  . atorvastatin (LIPITOR) 40 MG tablet TAKE ONE-HALF (0.5) TABLET BY MOUTH ONCEDAILY AT 6PM 90 tablet 1  . cetirizine (ZYRTEC) 10 MG tablet Take 10 mg by mouth daily.    . DULoxetine (CYMBALTA) 20 MG capsule TAKE ONE CAPSULE BY MOUTH EVERY NIGHT AT BEDTIME 30 capsule 2  . fluticasone (FLONASE) 50 MCG/ACT nasal spray Place 1 spray into both nostrils daily as needed for allergies or rhinitis.    . furosemide (LASIX) 40 MG tablet Take 1 tablet (40 mg total) by mouth 2 (two) times daily. 60 tablet 3  . GAS RELIEF 80 MG chewable tablet CHEW ONE TABLET BY MOUTH EVERY 6 HOURS AS NEEDED FOR GAS. 30 tablet 5  . glimepiride (AMARYL) 1 MG tablet TAKE 1 TABLET BY MOUTH DAILY WITH BREAKFAST 90 tablet 1  . isosorbide mononitrate (IMDUR) 30 MG 24 hr tablet Take 1 tablet (30 mg total) by mouth daily. 30 tablet 6  . metoprolol succinate (TOPROL-XL) 50 MG 24 hr tablet TAKE 1 TABLET  BY MOUTH TWICE A DAY 60 tablet 3  . Multiple Vitamins-Minerals (ONE-A-DAY 50 PLUS PO) Take 1 capsule by mouth daily.    . ONE TOUCH ULTRA TEST test strip USE TO CHECK BLOOD SUGAR ONCE A DAY AS DIRECTED 100 each 3  . ONGLYZA 2.5 MG TABS tablet TAKE 1 TABLET BY MOUTH DAILY 30 tablet 1  . pantoprazole (PROTONIX) 40 MG tablet TAKE 1 TABLET BY MOUTH DAILY 30 tablet 5  . senna-docusate (SENOKOT-S) 8.6-50 MG tablet Take 1 tablet by mouth daily as needed for mild constipation.    . traZODone (DESYREL) 50 MG tablet Take 0.5-1 tablets (25-50 mg total) by mouth at bedtime as needed for sleep. Don't take >50mg  in a day. 30 tablet 3  . benzonatate (TESSALON) 100 MG capsule Take 1 capsule (100 mg total) by mouth 3 (three) times daily as needed for cough. (Patient not taking: Reported on 01/20/2017) 30 capsule 0  . ondansetron (ZOFRAN ODT) 4 MG disintegrating tablet Take 1 tablet (4 mg total) by mouth every 8 (eight) hours  as needed for nausea or vomiting. (Patient not taking: Reported on 01/20/2017) 20 tablet 0   No current facility-administered medications for this encounter.    BP 110/72   Pulse 81   Wt 190 lb 6 oz (86.4 kg)   SpO2 98%   BMI 37.18 kg/m  General:  Elderly well appearing. No resp difficulty. Daughter present  HEENT: normal Neck: supple. no JVD. Carotids 2+ bilat; no bruits. No lymphadenopathy or thryomegaly appreciated. Cor: PMI nondisplaced. Regular rate & rhythm. No rubs, gallops or murmurs. Lungs: clear Abdomen: obese, soft, nontender, nondistended. No hepatosplenomegaly. No bruits or masses. Good bowel sounds. Extremities: no cyanosis, clubbing, rash, R and LLE trace edema Neuro: alert & orientedx3, cranial nerves grossly intact. moves all 4 extremities w/o difficulty. Affect pleasant  Assessment/Plan: 1. Chronic diastolic CHF: EF 40-98% by echo 10/16. Etiology uncertain: could be tachy-mediated cardiomyopathy, but symptoms seemed to predate the onset of atrial fibrillation.  Possible viral myocarditis. Cannot rule out ischemic cardiomyopathy, but no definite anginal symptoms. Cardiolite in 11/16 showed a small area possibly of prior infarction but EF and wall motion abnormalities were out of proportion to the perfusion defect. By 8/17, EF was improved to 55-60%.   NYHA III.  Volume status stable. Continue lasix 40 mg twice a day.  - Continue current Toprol XL.  -Repeat ECHO  -Check BMEt and BNP  2. Atrial fibrillation: NSR today. Continue current dose of bb. No bleeding problems.  -Continue amio and eliquis at current dose. Check CBC today.  - next visit check TSH, LFTs. She understands she will need yearly eye exams.    3. CKD Stage III: BMET today.  4. Medtronic PPM for symptomatic bradycardia.   5. CAD: Possible old infarction on Cardiolite.  N No CP. Continue statin and bb at current dose.  6. ?Claudication: Foot pain with ambulation.  With normal ABIs, suspect foot pain/burning is diabetic neuropathy.  7. Carotid stenosis: Repeat carotid dopplers in 9/18.  8. HTN: Stable to today.   9. Deconditioned: Had mechanical fall. Having balance issues. Refer to [facility] for PT.   Followup in 3 months.   Donis Kotowski NP-C  01/20/2017

## 2017-01-24 ENCOUNTER — Encounter: Payer: Self-pay | Admitting: Family Medicine

## 2017-01-24 ENCOUNTER — Telehealth: Payer: Self-pay | Admitting: Internal Medicine

## 2017-01-24 NOTE — Telephone Encounter (Signed)
New message    Pt son is very concerned , very weak and lathargic, she has fallen 3 times yesterday, and she is giving up.  Pt son thinks she needs rehab or therapy at home.  She is having bouts of depression. She is having Dizziness (not sure if cardiology or diabetes)  Have him paged at work when you call him, do not leave message !!

## 2017-01-24 NOTE — Telephone Encounter (Signed)
Advised Dr Shirlee Latch pt was just seen in our clinic on [address] was doing pretty well.  He advised we touch base w/pt's son see what is going on, maybe have them check BP and let us know if low, if BP is not low should f/u w/pcp.  I called and spoke w/pt's son, he states p tis just really weak, he feels it just because she sits around a lot, she does get up and do a little walking but overall he feels she is just deconditioned and weak.  He is asked about possible if pt needs home health PT or even placement in facility for some rehab. Advised medicare will usually only pay for facililties after a hospitalization, pt may benefit from HHPT.  Will discuss w/Amy Clegg, NP as she was pt last week and will let him know what she thinks.

## 2017-01-24 NOTE — Telephone Encounter (Signed)
Please work her in to be seen this week, sounds like she may be hypotensive.

## 2017-01-24 NOTE — Telephone Encounter (Signed)
Patient sees Dr. Shirlee Latch in CHF and Dr. Ladona Ridgel. She has most recently see CHF clinic. Will forward to CHF nurses/ Dr. Shirlee Latch to review.

## 2017-01-24 NOTE — Addendum Note (Signed)
Encounter addended by: Sherald Hess, NP on: 01/24/2017  4:03 PM<BR>    Actions taken: Sign clinical note

## 2017-01-25 ENCOUNTER — Ambulatory Visit: Payer: Medicare Other | Admitting: Family Medicine

## 2017-01-25 NOTE — Telephone Encounter (Signed)
Pt sch to see pcp tomorrow (4/18) to discuss further per chart notes.  Attempted to call son back but he is not at work today.

## 2017-01-26 ENCOUNTER — Ambulatory Visit (INDEPENDENT_AMBULATORY_CARE_PROVIDER_SITE_OTHER): Payer: Medicare Other | Admitting: Family Medicine

## 2017-01-26 ENCOUNTER — Encounter: Payer: Self-pay | Admitting: Family Medicine

## 2017-01-26 VITALS — BP 128/70 | HR 89 | Wt 190.5 lb

## 2017-01-26 DIAGNOSIS — R5383 Other fatigue: Secondary | ICD-10-CM | POA: Diagnosis not present

## 2017-01-26 DIAGNOSIS — W19XXXA Unspecified fall, initial encounter: Secondary | ICD-10-CM | POA: Diagnosis not present

## 2017-01-26 DIAGNOSIS — Y92009 Unspecified place in unspecified non-institutional (private) residence as the place of occurrence of the external cause: Secondary | ICD-10-CM | POA: Diagnosis not present

## 2017-01-26 DIAGNOSIS — E1121 Type 2 diabetes mellitus with diabetic nephropathy: Secondary | ICD-10-CM

## 2017-01-26 DIAGNOSIS — E1140 Type 2 diabetes mellitus with diabetic neuropathy, unspecified: Secondary | ICD-10-CM

## 2017-01-26 DIAGNOSIS — E1165 Type 2 diabetes mellitus with hyperglycemia: Secondary | ICD-10-CM

## 2017-01-26 LAB — HEMOGLOBIN A1C: HEMOGLOBIN A1C: 9 % — AB (ref 4.6–6.5)

## 2017-01-26 MED ORDER — LORATADINE 5 MG PO CHEW
5.0000 mg | CHEWABLE_TABLET | Freq: Every day | ORAL | Status: DC
Start: 2017-01-26 — End: 2017-07-26

## 2017-01-26 NOTE — Assessment & Plan Note (Signed)
Needs HHPT set up, refer.  D/w pt  She agrees.  It is very likely that lack of mobility is affecting her sense of independence, and her mood.  We'll get PT set up and then go from there.  She agrees.

## 2017-01-26 NOTE — Progress Notes (Signed)
She has been more fatigued recently.  Her sleep cycle is off.  Still taking zyrtec, unclear if that makes her drowsy. She is noted to be less independent and that has to be frustrating for patient.  Her exercise tolerance isn't as good as prev.  "I give out sooner than I used to" and "I feel useless."     DM2.  Sugar has been 260s or higher.  Due for a1c.  Still on baseline meds.  Diet is restricted in the meantime to limit carbs.  She has had some tingling in her feet.    She has fallen mult times.  Still using walker.  D/w pt about restarting HH PT.   PMH and SH reviewed  ROS: Per HPI unless specifically indicated in ROS section   Meds, vitals, and allergies reviewed.   GEN: nad, alert and oriented HEENT: mucous membranes moist NECK: supple w/o LA CV: rrr PULM: ctab, no inc wob ABD: soft, +bs EXT: no edema  Diabetic foot exam: Normal inspection No skin breakdown No calluses  Normal DP pulses Normal sensation to light touch but dec to monofilament B Nails normal except for L 1st nail thickened.

## 2017-01-26 NOTE — Assessment & Plan Note (Signed)
See notes on labs. 

## 2017-01-26 NOTE — Assessment & Plan Note (Signed)
See notes on labs.  May need insulin start.  D/w pt.  Daughter would be able to give patient injection daily.  >25 minutes spent in face to face time with patient, >50% spent in counselling or coordination of care.

## 2017-01-26 NOTE — Assessment & Plan Note (Signed)
Recent labs at baseline, it may be that depression/decondition contribute, but would change antihistamine to try to limit fatigue from rx source.  She agrees.

## 2017-01-26 NOTE — Progress Notes (Signed)
Pre visit review using our clinic review tool, if applicable. No additional management support is needed unless otherwise documented below in the visit note. 

## 2017-01-26 NOTE — Patient Instructions (Signed)
Go to the lab on the way out.  We'll contact you with your lab report. Danielle Benitez will call about your referral. We may need to get you set up with insulin injection once a day.  Stop zyrtec, change to claritin.  Take care.  Glad to see you.  Update me as needed.

## 2017-01-27 ENCOUNTER — Other Ambulatory Visit: Payer: Self-pay | Admitting: Family Medicine

## 2017-01-27 MED ORDER — INSULIN GLARGINE 100 UNIT/ML SOLOSTAR PEN: 5.0000 [IU] | PEN_INJECTOR | Freq: Every day | SUBCUTANEOUS | 99 refills | Status: DC

## 2017-01-27 MED ORDER — PEN NEEDLES 30G X 5 MM MISC: 1.0000 | Freq: Every day | 3 refills | Status: AC

## 2017-01-28 ENCOUNTER — Telehealth: Payer: Self-pay

## 2017-01-28 NOTE — Telephone Encounter (Signed)
[name] only [facility] pharmacy; [facility] removed as requested.

## 2017-02-01 ENCOUNTER — Telehealth: Payer: Self-pay | Admitting: Family Medicine

## 2017-02-01 NOTE — Telephone Encounter (Signed)
Received a message from patients daughter that her mother Danielle Benitez  fell again today and [name] had to call 911 to get her up. She asked me to pass this information on to you. Also they received a letter from [facility] telling them that they can no longer use them for Home Health as they will be considered out of network. I used your recent referral and am sending it to [facility] to see if they can accept her for the Home PT.

## 2017-02-02 ENCOUNTER — Ambulatory Visit (INDEPENDENT_AMBULATORY_CARE_PROVIDER_SITE_OTHER): Payer: Medicare Other | Admitting: Family Medicine

## 2017-02-02 ENCOUNTER — Encounter: Payer: Self-pay | Admitting: Family Medicine

## 2017-02-02 ENCOUNTER — Other Ambulatory Visit: Payer: Self-pay | Admitting: Family Medicine

## 2017-02-02 ENCOUNTER — Ambulatory Visit (INDEPENDENT_AMBULATORY_CARE_PROVIDER_SITE_OTHER)
Admission: RE | Admit: 2017-02-02 | Discharge: 2017-02-02 | Disposition: A | Discharge status: Home / Self Care | Payer: Medicare Other | Source: Ambulatory Visit | Attending: Family Medicine | Admitting: Family Medicine

## 2017-02-02 VITALS — BP 100/60 | HR 76 | Temp 98.3°F | Ht 60.0 in | Wt 189.5 lb

## 2017-02-02 DIAGNOSIS — M7989 Other specified soft tissue disorders: Secondary | ICD-10-CM | POA: Diagnosis not present

## 2017-02-02 DIAGNOSIS — W19XXXA Unspecified fall, initial encounter: Secondary | ICD-10-CM | POA: Diagnosis not present

## 2017-02-02 DIAGNOSIS — S99922A Unspecified injury of left foot, initial encounter: Secondary | ICD-10-CM

## 2017-02-02 DIAGNOSIS — M25572 Pain in left ankle and joints of left foot: Secondary | ICD-10-CM | POA: Diagnosis not present

## 2017-02-02 DIAGNOSIS — R58 Hemorrhage, not elsewhere classified: Secondary | ICD-10-CM | POA: Diagnosis not present

## 2017-02-02 NOTE — Progress Notes (Signed)
Pre visit review using our clinic review tool, if applicable. No additional management support is needed unless otherwise documented below in the visit note. 

## 2017-02-02 NOTE — Telephone Encounter (Signed)
I think trying to get her set up with home help with any available agency is the best option. Thanks for the update. Let me know if is anything I can do. Thanks.

## 2017-02-03 ENCOUNTER — Telehealth: Payer: Self-pay

## 2017-02-03 NOTE — Telephone Encounter (Signed)
Alan Ripper with [facility] left v/m;Claire request cb; received a referral from Dr Para March and 02/03/17 received another referral from Dr Patsy Lager. Alan Ripper needs to know who will be signing as the provider. Alan Ripper also wants to know if pt just needs [facility] PT or does pt need HH nursing also.

## 2017-02-03 NOTE — Progress Notes (Signed)
Dr. Karleen Hampshire T. Colby Reels, MD, CAQ [facility] [address] [place], [address] Phone: [phone] Fax: [phone]  02/02/2017  Patient: Danielle Benitez, MRN: [id number], DOB: July 19, 1932, 83 y.o.  Primary Physician:  Crawford Givens, MD   Chief Complaint  Patient presents with  . Fall    02/01/2017  . Foot Swelling    Left   Subjective:   Danielle Benitez is a 83 y.o. very pleasant female patient who presents with the following:  The patient sustained a fall on February 01, 2017.  She was trying to get out of bed and her foot went up underneath her, and she sustained immediate pain.  She was unable to get to the floor, and EMS was called.  They assessed her at that time, and she was not transported.  She was here today brought in with her daughter for evaluation given extensive swelling, bruising, and pain with walking and.  Bearing eight.  History significant for Eliquis use chronically.  No significant history regarding the foot and ankle on the side or significant prior injuries.  Past Medical History, Surgical History, Social History, Family History, Problem List, Medications, and Allergies have been reviewed and updated if relevant.  Patient Active Problem List   Diagnosis Date Noted  . Type 2 diabetes mellitus with diabetic neuropathy, unspecified (HCC) 01/26/2017  . Dysuria 09/21/2016  . Fall 06/25/2016  . Skin tear of elbow without complication 06/24/2016  . Injury of left shoulder and upper arm 06/24/2016  . Unequal blood pressure in upper extremities 06/03/2016  . Cardiac pacemaker in situ 04/12/2016  . Cough 03/08/2016  . Abnormality of gait 03/05/2016  . Risk for falls 03/05/2016  . Insomnia 01/21/2016  . Chronic systolic congestive heart failure, NYHA class 3 (HCC) 01/20/2016  . LFT elevation 11/21/2015  . Weakness 11/06/2015  . Dysphagia   . Esophageal dysmotility 08/05/2015  . Anticoagulated- Eliquis 07/31/2015  .  Normochromic normocytic anemia 07/31/2015  . Atrial fibrillation - 07/11/15 by PPM interrogation, recurrent 10/19 after DCCV 10/14 07/18/2015  . Moderate mitral regurgitation 07/18/2015  . Moderate tricuspid regurgitation 07/18/2015  . History of pacemaker - Medtronic 07/18/2015  . CKD (chronic kidney disease), stage III   . Orthostatic hypotension   . Essential hypertension   . Pericardial effusion 07/15/2015  . Female stress incontinence 05/06/2015  . Knee pain 12/18/2014  . Low back pain 02/27/2014  . Obese 02/27/2014  . Depression 02/27/2014  . Pain in joint, shoulder region 11/22/2013  . Eczema 11/22/2013  . S/P cardiac pacemaker procedure, PPM Medtronic REVO placed 01/11/2012 01/11/2012  . COPD bronchitis by CXR 01/10/2012  . Dyslipidemia 01/10/2012  . Mild CAD - 20-30% RCA in 2013 01/10/2012  . Fatigue 01/10/2012  . Sinus bradycardia, HR as low As 35 01/10/2012  . Mild aortic stenosis 01/10/2012  . SOB (shortness of breath) 01/08/2012  . DM, type 2, with renal complications  01/08/2012    Past Medical History:  Diagnosis Date  . Abnormality of gait   . Anemia, unspecified   . Anticoagulated- Eliquis 07/31/2015  . Anxiety state, unspecified   . Aortic stenosis    a. Mild by echo 08/2013.  Marland Kitchen Atrial fibrillation - developed 07/11/15 by PPM interrogation, recurrent after DCCV 10/14 07/11/2015    recurrent after DCCV 10/14  . Bifascicular block    afib  . Chronic systolic CHF (congestive heart failure) (HCC)    a. Dx 07/2015 - EF 35-40%, unclear  etiology  . CKD (chronic kidney disease), stage III   . Depressive disorder, not elsewhere classified   . DM (diabetes mellitus), type 2, uncontrolled, with renal complications (HCC) 01/08/2012  . Eczema 11/22/2013  . Esophageal dysmotility 08/05/2015  . Essential hypertension   . Female stress incontinence 05/06/2015  . Full dentures   . GERD (gastroesophageal reflux disease)   . Gout   . HOH (hard of hearing)   . Internal  hemorrhoids without mention of complication   . Lumbago   . Memory loss   . Mild CAD    a. Cath 2013: 20-30% prox RCA.  Marland Kitchen Myocardial infarction (HCC) 07/2015  . Nonspecific abnormal results of liver function study   . Obesity, unspecified   . Orthostatic hypotension   . Osteoarthritis   . Other and unspecified hyperlipidemia   . Pain in joint, shoulder region 11/22/2013   left   . Personal history of fall   . Presence of permanent cardiac pacemaker   . Pruritus 11/22/2013  . S/P cardiac pacemaker procedure, PPM Medtronic REVO placed 01/11/2012 01/11/2012   a. for symptomatic bradycardia. Followed by Dr. Ladona Ridgel.  . Shortness of breath dyspnea    with movement-  . Wears glasses     Past Surgical History:  Procedure Laterality Date  . CARDIAC CATHETERIZATION  12/03/2003   Dr Clarene Duke  . CARDIOVERSION N/A 07/25/2015   Procedure: CARDIOVERSION;  Surgeon: Pricilla Riffle, MD;  Location: [facility] ENDOSCOPY;  Service: Cardiovascular;  Laterality: N/A;  . CARDIOVERSION N/A 08/06/2015   Procedure: CARDIOVERSION;  Surgeon: Laurey Morale, MD;  Location: [facility] ENDOSCOPY;  Service: Cardiovascular;  Laterality: N/A;  . CATARACT EXTRACTION W/ INTRAOCULAR LENS  IMPLANT, BILATERAL Bilateral 03/2015-04/2015  . COLONOSCOPY     ?????  . EP IMPLANTABLE DEVICE N/A 04/15/2016   Procedure: Pacemaker Implant;  Surgeon: Marinus Maw, MD;  Location: [facility] INVASIVE CV LAB;  Service: Cardiovascular;  Laterality: N/A;  . ESOPHAGOGASTRODUODENOSCOPY N/A 08/08/2015   Procedure: ESOPHAGOGASTRODUODENOSCOPY (EGD);  Surgeon: Iva Boop, MD;  Location: [facility] ENDOSCOPY;  Service: Endoscopy;  Laterality: N/A;  . EXTREMITY CYST EXCISION     finger  . EYE SURGERY Bilateral    catartact  . INSERT / REPLACE / REMOVE PACEMAKER    . LEFT HEART CATHETERIZATION WITH CORONARY ANGIOGRAM N/A 01/10/2012   Procedure: LEFT HEART CATHETERIZATION WITH CORONARY ANGIOGRAM;  Surgeon: Chrystie Nose, MD;  Location: [facility] CATH LAB;  Service: Cardiovascular;   Laterality: N/A;  . ORIF WRIST FRACTURE Right 04/09/2014   Procedure: OPEN REDUCTION INTERNAL FIXATION (ORIF) RIGHT DISPLACED  DISTAL RADIUS  FRACTURE;  Surgeon: Dominica Severin, MD;  Location: [facility];  Service: Orthopedics;  Laterality: Right;  . PACEMAKER LEAD REMOVAL  04/12/2016   TEMPORARY PACEMAKER INSERTION  . PACEMAKER LEAD REMOVAL  04/15/2016   Procedure: Pacemaker Lead Removal;  Surgeon: Marinus Maw, MD;  Location: [facility] INVASIVE CV LAB;  Service: Cardiovascular;;  . PACEMAKER LEAD REMOVAL N/A 04/12/2016   Procedure: PACEMAKER LEAD REMOVAL;  Surgeon: Marinus Maw, MD;  Location[facility] OR;  Service: Cardiovascular;  Laterality: N/A;  . PERMANENT PACEMAKER INSERTION Left 01/11/2012   Procedure: PERMANENT PACEMAKER INSERTION;  Surgeon: Thurmon Fair, MD;  Location: [facility] CATH LAB;  Service: Cardiovascular;  Laterality: Left;  . TEE WITHOUT CARDIOVERSION N/A 07/25/2015   Procedure: TRANSESOPHAGEAL ECHOCARDIOGRAM (TEE);  Surgeon: Pricilla Riffle, MD;  Location: [facility] ENDOSCOPY;  Service: Cardiovascular;  Laterality: N/A;    Social History   Social History  .  Marital status: Widowed    Spouse name: N/A  . Number of children: N/A  . Years of education: N/A   Occupational History  . Not on file.   Social History Main Topics  . Smoking status: Never Smoker  . Smokeless tobacco: Never Used  . Alcohol use No  . Drug use: No  . Sexual activity: No   Other Topics Concern  . Not on file   Social History Narrative   Physically inactive. She says she hurts in her back and knees too much to do much walking.   9th grade   Worked in [employer]   Living with daughter    Doesn't drive as of 49/8264    Family History  Problem Relation Age of Onset  . Breast cancer Mother   . Cancer Mother   . Diabetes Daughter   . Hypertension Daughter   . Cancer Brother     Allergies  Allergen Reactions  . Beta Adrenergic Blockers Other (See Comments)    Couldn't speak or  hear Metoprolol seems ok  . Jardiance [Empagliflozin] Other (See Comments)    Rash, kidney issues    Medication list reviewed and updated in full in [facility].  GEN: No fevers, chills. Nontoxic. Primarily MSK c/o today. MSK: Detailed in the HPI GI: tolerating PO intake without difficulty Neuro: No numbness, parasthesias, or tingling associated. Otherwise the pertinent positives of the ROS are noted above.   Objective:   BP 100/60   Pulse 76   Temp 98.3 F (36.8 C) (Oral)   Ht 5' (1.524 m)   Wt 189 lb 8 oz (86 kg)   BMI 37.01 kg/m    GEN: WDWN, NAD, Non-toxic, Alert & Oriented x 3 HEENT: Atraumatic, Normocephalic.  Ears and Nose: No external deformity. EXTR: No clubbing/cyanosis/ 1-2+ edema in the foot region. NEURO: in wheelchair. Sensation is intact. PSYCH: Normally interactive. Conversant. Not depressed or anxious appearing.  Calm demeanor.    Extensive ecchymosis on the dorsum of the foot throughout.  Extensive swelling.  Patient has mild to moderate tenderness in most toes.  Mild tenderness along the majority of the metacarpals.  Minimal tenderness in the midfoot.  She has in some tenderness in the region of the lateral and medial ankle mildly.  Mild tenderness again in the medial and lateral malleolus.  Minimal tenderness at the talus.  Mild tenderness at the navicular.  Mild tenderness at the cuboid.  No significant tenderness at the fifth metatarsal base.  Radiology: Dg Ankle Complete Left  Result Date: 02/02/2017 CLINICAL DATA:  Fall 02/01/2017.  Left foot injury. EXAM: LEFT ANKLE COMPLETE - 3+ VIEW COMPARISON:  None. FINDINGS: Soft swelling is worse over the lateral malleolus. The ankle is located. No acute osseous abnormality is present. IMPRESSION: 1. Bimalleolar soft tissue swelling is worse over the lateral malleolus. Ligamentous injury is not excluded. 2. No acute osseous abnormality. Electronically Signed   By: Marin Roberts M.D.   On: 02/02/2017  16:38   Dg Foot Complete Left  Result Date: 02/02/2017 CLINICAL DATA:  Fall 02/01/2017.  Left foot injury. EXAM: LEFT FOOT - COMPLETE 3+ VIEW COMPARISON:  None. FINDINGS: Mild generalized osteopenia is present. Diffuse soft tissue swelling is noted. No acute or focal osseous abnormality is present. IMPRESSION: 1. No acute or focal osseous abnormality. 2. Is mild diffuse soft swelling may reflect extremity edema or cellulitis. Soft tissue injury is considered less likely. Electronically Signed   By: Virl Son.D.  On: 02/02/2017 16:36    Assessment and Plan:   Foot injury, left, initial encounter - Plan: DG Foot Complete Left, DG Ankle Complete Left  Acute left ankle pain  Ecchymosis  Fall, initial encounter  No apparent fractures on XR.  Extensive ecchymosis.  My suspicion is that the patient's Eliquis has contributed to this significantly.  At this point difficulty walking.  I placed the patient in an Aircast for additional stability.   Of primary importance right now is that she does not have an additional fall.  She is going to exclusively use her walker and be very careful.  Her daughter does live with her and can provide additional help.  Previously, home health had been arranged to start balance training, which at this point is going to not be helpful until the patient's ankle improves.  We will delay this by 2 weeks.  Home Health documentation: Face to face encounter documentation follows: Patient needs home health services for the following reasons:  Home health services needed:  1. HHPT: evaluate and treat.  Balance and gait assessment.  Ankle rehabilitation and foot rehabilitation after recent injury.  To begin 2 weeks from today's date.  2-3 times per week for 4 weeks. Patient homebound for the following reasons who: congestive heart failure, COPD, acute ankle injury.  Follow-up: if needed, HH to follow  Orders Placed This Encounter  Procedures  . DG Foot  Complete Left  . DG Ankle Complete Left    Signed,  Aileena Iglesia T. Jacorian Golaszewski, MD   Allergies as of 02/02/2017      Reactions   Beta Adrenergic Blockers Other (See Comments)   Couldn't speak or hear Metoprolol seems ok   Jardiance [empagliflozin] Other (See Comments)   Rash, kidney issues      Medication List       Accurate as of 02/02/17 11:59 PM. Always use your most recent med list.          acetaminophen 325 MG tablet Commonly known as:  TYLENOL Take 1-2 tablets (325-650 mg total) by mouth 3 (three) times daily as needed.   albuterol 108 (90 Base) MCG/ACT inhaler Commonly known as:  PROVENTIL HFA;VENTOLIN HFA Inhale 2 puffs into the lungs every 4 (four) hours as needed for wheezing or shortness of breath (cough, shortness of breath or wheezing.).   allopurinol 300 MG tablet Commonly known as:  ZYLOPRIM TAKE 1 TABLET BY MOUTH DAILY   amiodarone 200 MG tablet Commonly known as:  PACERONE TAKE 1 TABLET BY MOUTH DAILY   apixaban 2.5 MG Tabs tablet Commonly known as:  ELIQUIS Take 1 tablet (2.5 mg total) by mouth 2 (two) times daily.   atorvastatin 40 MG tablet Commonly known as:  LIPITOR TAKE ONE-HALF (0.5) TABLET BY MOUTH ONCEDAILY AT 6PM   DULoxetine 20 MG capsule Commonly known as:  CYMBALTA TAKE ONE CAPSULE BY MOUTH EVERY NIGHT AT BEDTIME   fluticasone 50 MCG/ACT nasal spray Commonly known as:  FLONASE Place 1 spray into both nostrils daily as needed for allergies or rhinitis.   furosemide 40 MG tablet Commonly known as:  LASIX Take 1 tablet (40 mg total) by mouth 2 (two) times daily.   GAS RELIEF 80 MG chewable tablet Generic drug:  simethicone CHEW ONE TABLET BY MOUTH EVERY 6 HOURS AS NEEDED FOR GAS.   glimepiride 1 MG tablet Commonly known as:  AMARYL TAKE 1 TABLET BY MOUTH DAILY WITH BREAKFAST   Insulin Glargine 100 UNIT/ML Solostar Pen Commonly known as:  LANTUS SOLOSTAR Inject 5 Units into the skin daily at 10 pm.   isosorbide mononitrate 30  MG 24 hr tablet Commonly known as:  IMDUR Take 1 tablet (30 mg total) by mouth daily.   loratadine 5 MG chewable tablet Commonly known as:  CLARITIN CHILDRENS Chew 1 tablet (5 mg total) by mouth daily.   metoprolol succinate 50 MG 24 hr tablet Commonly known as:  TOPROL-XL TAKE 1 TABLET BY MOUTH TWICE A DAY   ondansetron 4 MG disintegrating tablet Commonly known as:  ZOFRAN ODT Take 1 tablet (4 mg total) by mouth every 8 (eight) hours as needed for nausea or vomiting.   ONE TOUCH ULTRA TEST test strip Generic drug:  glucose blood USE TO CHECK BLOOD SUGAR ONCE A DAY AS DIRECTED   ONE-A-DAY 50 PLUS PO Take 1 capsule by mouth daily.   ONGLYZA 2.5 MG Tabs tablet Generic drug:  saxagliptin HCl TAKE 1 TABLET BY MOUTH DAILY   pantoprazole 40 MG tablet Commonly known as:  PROTONIX TAKE 1 TABLET BY MOUTH DAILY   Pen Needles 30G X 5 MM Misc Inject 1 Dose as directed daily. Use with insulin pen   senna-docusate 8.6-50 MG tablet Commonly known as:  Senokot-S Take 1 tablet by mouth daily as needed for mild constipation.   traZODone 50 MG tablet Commonly known as:  DESYREL Take 0.5-1 tablets (25-50 mg total) by mouth at bedtime as needed for sleep. Don't take >50mg  in a day.

## 2017-02-04 ENCOUNTER — Encounter: Payer: Self-pay | Admitting: Family Medicine

## 2017-02-04 NOTE — Telephone Encounter (Signed)
Use the referral from me. Needs home health PT at this point. Not nursing. Thanks.

## 2017-02-04 NOTE — Telephone Encounter (Signed)
Alan Ripper with [facility] advised.

## 2017-02-10 ENCOUNTER — Encounter: Payer: Medicare Other | Admitting: *Deleted

## 2017-02-10 ENCOUNTER — Telehealth: Payer: Self-pay | Admitting: Cardiology

## 2017-02-10 NOTE — Telephone Encounter (Signed)
Confirmed remote transmission w/ pt daughter.   

## 2017-02-11 ENCOUNTER — Other Ambulatory Visit: Payer: Self-pay | Admitting: Physician Assistant

## 2017-02-11 ENCOUNTER — Other Ambulatory Visit: Payer: Medicare Other

## 2017-02-14 ENCOUNTER — Encounter (HOSPITAL_COMMUNITY): Payer: Self-pay

## 2017-02-14 ENCOUNTER — Other Ambulatory Visit (HOSPITAL_COMMUNITY): Payer: Self-pay | Admitting: Cardiology

## 2017-02-14 ENCOUNTER — Observation Stay (HOSPITAL_COMMUNITY)
Admission: EM | Admit: 2017-02-14 | Discharge: 2017-02-16 | Disposition: A | Discharge status: Home / Self Care | Payer: Medicare Other | Attending: Internal Medicine | Admitting: Internal Medicine

## 2017-02-14 ENCOUNTER — Emergency Department (HOSPITAL_COMMUNITY): Payer: Medicare Other

## 2017-02-14 DIAGNOSIS — I2510 Atherosclerotic heart disease of native coronary artery without angina pectoris: Secondary | ICD-10-CM | POA: Insufficient documentation

## 2017-02-14 DIAGNOSIS — I130 Hypertensive heart and chronic kidney disease with heart failure and stage 1 through stage 4 chronic kidney disease, or unspecified chronic kidney disease: Secondary | ICD-10-CM | POA: Diagnosis not present

## 2017-02-14 DIAGNOSIS — J449 Chronic obstructive pulmonary disease, unspecified: Secondary | ICD-10-CM | POA: Diagnosis not present

## 2017-02-14 DIAGNOSIS — R0789 Other chest pain: Principal | ICD-10-CM | POA: Insufficient documentation

## 2017-02-14 DIAGNOSIS — Z7901 Long term (current) use of anticoagulants: Secondary | ICD-10-CM | POA: Insufficient documentation

## 2017-02-14 DIAGNOSIS — N184 Chronic kidney disease, stage 4 (severe): Secondary | ICD-10-CM | POA: Diagnosis present

## 2017-02-14 DIAGNOSIS — Z950 Presence of cardiac pacemaker: Secondary | ICD-10-CM | POA: Diagnosis not present

## 2017-02-14 DIAGNOSIS — E1122 Type 2 diabetes mellitus with diabetic chronic kidney disease: Secondary | ICD-10-CM | POA: Diagnosis not present

## 2017-02-14 DIAGNOSIS — R079 Chest pain, unspecified: Secondary | ICD-10-CM | POA: Diagnosis present

## 2017-02-14 DIAGNOSIS — N183 Chronic kidney disease, stage 3 (moderate): Secondary | ICD-10-CM | POA: Diagnosis not present

## 2017-02-14 DIAGNOSIS — I5022 Chronic systolic (congestive) heart failure: Secondary | ICD-10-CM | POA: Insufficient documentation

## 2017-02-14 DIAGNOSIS — E1165 Type 2 diabetes mellitus with hyperglycemia: Secondary | ICD-10-CM

## 2017-02-14 DIAGNOSIS — E1129 Type 2 diabetes mellitus with other diabetic kidney complication: Secondary | ICD-10-CM | POA: Diagnosis present

## 2017-02-14 DIAGNOSIS — I252 Old myocardial infarction: Secondary | ICD-10-CM | POA: Diagnosis not present

## 2017-02-14 DIAGNOSIS — I10 Essential (primary) hypertension: Secondary | ICD-10-CM | POA: Diagnosis not present

## 2017-02-14 DIAGNOSIS — E1121 Type 2 diabetes mellitus with diabetic nephropathy: Secondary | ICD-10-CM

## 2017-02-14 DIAGNOSIS — E785 Hyperlipidemia, unspecified: Secondary | ICD-10-CM | POA: Diagnosis present

## 2017-02-14 DIAGNOSIS — E1140 Type 2 diabetes mellitus with diabetic neuropathy, unspecified: Secondary | ICD-10-CM | POA: Insufficient documentation

## 2017-02-14 DIAGNOSIS — Z794 Long term (current) use of insulin: Secondary | ICD-10-CM | POA: Insufficient documentation

## 2017-02-14 DIAGNOSIS — R1310 Dysphagia, unspecified: Secondary | ICD-10-CM

## 2017-02-14 DIAGNOSIS — Z79899 Other long term (current) drug therapy: Secondary | ICD-10-CM | POA: Insufficient documentation

## 2017-02-14 DIAGNOSIS — I480 Paroxysmal atrial fibrillation: Secondary | ICD-10-CM | POA: Diagnosis not present

## 2017-02-14 DIAGNOSIS — D649 Anemia, unspecified: Secondary | ICD-10-CM | POA: Diagnosis present

## 2017-02-14 DIAGNOSIS — IMO0002 Reserved for concepts with insufficient information to code with codable children: Secondary | ICD-10-CM | POA: Diagnosis present

## 2017-02-14 DIAGNOSIS — I4891 Unspecified atrial fibrillation: Secondary | ICD-10-CM | POA: Diagnosis present

## 2017-02-14 LAB — BASIC METABOLIC PANEL
Anion gap: 9 (ref 5–15)
BUN: 23 mg/dL — AB (ref 6–20)
CALCIUM: 8.8 mg/dL — AB (ref 8.9–10.3)
CO2: 31 mmol/L (ref 22–32)
CREATININE: 1.65 mg/dL — AB (ref 0.44–1.00)
Chloride: 98 mmol/L — ABNORMAL LOW (ref 101–111)
GFR calc Af Amer: 32 mL/min — ABNORMAL LOW (ref >60)
GFR, EST NON AFRICAN AMERICAN: 28 mL/min — AB (ref >60)
GLUCOSE: 219 mg/dL — AB (ref 65–99)
Potassium: 3.7 mmol/L (ref 3.5–5.1)
Sodium: 138 mmol/L (ref 135–145)

## 2017-02-14 LAB — CBC
HEMATOCRIT: 34.8 % — AB (ref 36.0–46.0)
Hemoglobin: 10.6 g/dL — ABNORMAL LOW (ref 12.0–15.0)
MCH: 26.6 pg (ref 26.0–34.0)
MCHC: 30.5 g/dL (ref 30.0–36.0)
MCV: 87.4 fL (ref 78.0–100.0)
Platelets: 152 10*3/uL (ref 150–400)
RBC: 3.98 MIL/uL (ref 3.87–5.11)
RDW: 18.7 % — AB (ref 11.5–15.5)
WBC: 7.7 10*3/uL (ref 4.0–10.5)

## 2017-02-14 LAB — CBG MONITORING, ED: Glucose-Capillary: 81 mg/dL (ref 65–99)

## 2017-02-14 LAB — I-STAT TROPONIN, ED: TROPONIN I, POC: 0 ng/mL (ref 0.00–0.08)

## 2017-02-14 LAB — GLUCOSE, CAPILLARY: Glucose-Capillary: 102 mg/dL — ABNORMAL HIGH (ref 65–99)

## 2017-02-14 MED ORDER — TRAZODONE HCL 50 MG PO TABS: 25.0000 mg | ORAL_TABLET | Freq: Every evening | ORAL | Status: DC | PRN

## 2017-02-14 MED ORDER — ALLOPURINOL 300 MG PO TABS
300.0000 mg | ORAL_TABLET | Freq: Every day | ORAL | Status: DC
  Administered 2017-02-15 – 2017-02-16 (×2): 300 mg via ORAL
  Filled 2017-02-14 (×2): qty 1

## 2017-02-14 MED ORDER — ATORVASTATIN CALCIUM 40 MG PO TABS
40.0000 mg | ORAL_TABLET | Freq: Every day | ORAL | Status: DC
  Administered 2017-02-14 – 2017-02-16 (×3): 40 mg via ORAL
  Filled 2017-02-14 (×3): qty 1

## 2017-02-14 MED ORDER — DULOXETINE HCL 20 MG PO CPEP
20.0000 mg | ORAL_CAPSULE | Freq: Every day | ORAL | Status: DC
  Administered 2017-02-14 – 2017-02-15 (×2): 20 mg via ORAL
  Filled 2017-02-14 (×3): qty 1

## 2017-02-14 MED ORDER — INSULIN GLARGINE 100 UNIT/ML ~~LOC~~ SOLN
19.0000 [IU] | Freq: Every day | SUBCUTANEOUS | Status: DC
  Administered 2017-02-15: 19 [IU] via SUBCUTANEOUS
  Filled 2017-02-14 (×3): qty 0.19

## 2017-02-14 MED ORDER — ACETAMINOPHEN 325 MG PO TABS: 650.0000 mg | ORAL_TABLET | ORAL | Status: DC | PRN

## 2017-02-14 MED ORDER — REGADENOSON 0.4 MG/5ML IV SOLN
0.4000 mg | Freq: Once | INTRAVENOUS | Status: DC
  Filled 2017-02-14: qty 5

## 2017-02-14 MED ORDER — MORPHINE SULFATE (PF) 2 MG/ML IV SOLN: 2.0000 mg | INTRAVENOUS | Status: DC | PRN

## 2017-02-14 MED ORDER — PANTOPRAZOLE SODIUM 40 MG PO TBEC
40.0000 mg | DELAYED_RELEASE_TABLET | Freq: Every day | ORAL | Status: DC
Start: 2017-02-15 — End: 2017-02-16
  Administered 2017-02-15 – 2017-02-16 (×2): 40 mg via ORAL
  Filled 2017-02-14 (×2): qty 1

## 2017-02-14 MED ORDER — ONDANSETRON HCL 4 MG/2ML IJ SOLN: 4.0000 mg | Freq: Four times a day (QID) | INTRAMUSCULAR | Status: DC | PRN

## 2017-02-14 MED ORDER — SENNOSIDES-DOCUSATE SODIUM 8.6-50 MG PO TABS: 1.0000 | ORAL_TABLET | Freq: Every day | ORAL | Status: DC | PRN

## 2017-02-14 MED ORDER — AMIODARONE HCL 200 MG PO TABS
200.0000 mg | ORAL_TABLET | Freq: Every day | ORAL | Status: DC
  Administered 2017-02-15 – 2017-02-16 (×2): 200 mg via ORAL
  Filled 2017-02-14 (×2): qty 1

## 2017-02-14 MED ORDER — APIXABAN 2.5 MG PO TABS
2.5000 mg | ORAL_TABLET | Freq: Two times a day (BID) | ORAL | Status: DC
  Administered 2017-02-14 – 2017-02-15 (×2): 2.5 mg via ORAL
  Filled 2017-02-14 (×2): qty 1

## 2017-02-14 MED ORDER — LORATADINE 10 MG PO TABS
5.0000 mg | ORAL_TABLET | Freq: Every day | ORAL | Status: DC
  Administered 2017-02-15 – 2017-02-16 (×2): 5 mg via ORAL
  Filled 2017-02-14 (×2): qty 1

## 2017-02-14 MED ORDER — FUROSEMIDE 40 MG PO TABS
40.0000 mg | ORAL_TABLET | Freq: Two times a day (BID) | ORAL | Status: DC
  Administered 2017-02-15 – 2017-02-16 (×3): 40 mg via ORAL
  Filled 2017-02-14 (×3): qty 1

## 2017-02-14 MED ORDER — ISOSORBIDE MONONITRATE ER 30 MG PO TB24
30.0000 mg | ORAL_TABLET | Freq: Every day | ORAL | Status: DC
  Administered 2017-02-15 – 2017-02-16 (×2): 30 mg via ORAL
  Filled 2017-02-14 (×2): qty 1

## 2017-02-14 MED ORDER — INSULIN ASPART 100 UNIT/ML ~~LOC~~ SOLN
0.0000 [IU] | SUBCUTANEOUS | Status: DC
  Administered 2017-02-15: 7 [IU] via SUBCUTANEOUS
  Administered 2017-02-15: 1 [IU] via SUBCUTANEOUS
  Administered 2017-02-15: 5 [IU] via SUBCUTANEOUS
  Administered 2017-02-15 – 2017-02-16 (×2): 2 [IU] via SUBCUTANEOUS
  Administered 2017-02-16: 3 [IU] via SUBCUTANEOUS
  Administered 2017-02-16: 7 [IU] via SUBCUTANEOUS

## 2017-02-14 MED ORDER — METOPROLOL SUCCINATE ER 50 MG PO TB24
50.0000 mg | ORAL_TABLET | Freq: Two times a day (BID) | ORAL | Status: DC
  Administered 2017-02-14 – 2017-02-16 (×4): 50 mg via ORAL
  Filled 2017-02-14 (×4): qty 1

## 2017-02-14 NOTE — ED Triage Notes (Signed)
Pt from home with substernal chest pain that radiates up to her neck. Pt has received 324mg  ASA with EMS

## 2017-02-14 NOTE — ED Notes (Signed)
Pt and daughter updated. I spoke directly with carrdiology PA who states they will be seeing them while she and the cardiologist are on the unit. PA aware that they have been waiting.

## 2017-02-14 NOTE — Consult Note (Signed)
CARDIOLOGY CONSULT NOTE       Patient ID: Danielle Benitez MRN: [id number] DOB/AGE: 1932-01-21 83 y.o.  Admit date: 02/14/2017 Referring Physician:  Rosalia Hammers Primary Physician: Joaquim Nam, MD Primary Cardiologist: Shirlee Latch Reason for Consultation: Chest Pain    HPI:  83 y.o. history of bradycardia with medtronic PPM, PAF chronic systolic CHF presumed non ischemic. Had normal cath 2005 and 2013. Last myovue 2016 suggested inferior basal scar no ischemia and EF 31% disproportionate to perfusion defect She has multiple co morbidities including CRF baseline Cr around 1.7 , iron deficiency anemia. She has extremely poor functional status and barely gets OOB. Seen in ER feeling poorly. Constantly chokes on food. Got some food stuck in throat and had SSCP followed by vomiting and nausea. Currently feels weak. ECG no acute changes and enzymes negative Seen by Leone Payor 07/2015 with barium swallow and EGD showing hiatal hernia and dysmotility. No stricture. No dyspnea , diaphoresis palpitations or syncope   ROS All other systems reviewed and negative except as noted above  Past Medical History:  Diagnosis Date  . Abnormality of gait   . Anemia, unspecified   . Anticoagulated- Eliquis 07/31/2015  . Anxiety state, unspecified   . Aortic stenosis    a. Mild by echo 08/2013.  Marland Kitchen Atrial fibrillation - developed 07/11/15 by PPM interrogation, recurrent after DCCV 10/14 07/11/2015    recurrent after DCCV 10/14  . Bifascicular block    afib  . Chronic systolic CHF (congestive heart failure) (HCC)    a. Dx 07/2015 - EF 35-40%, unclear etiology  . CKD (chronic kidney disease), stage III   . Depressive disorder, not elsewhere classified   . DM (diabetes mellitus), type 2, uncontrolled, with renal complications (HCC) 01/08/2012  . Eczema 11/22/2013  . Esophageal dysmotility 08/05/2015  . Essential hypertension   . Female stress incontinence 05/06/2015  . Full dentures   . GERD (gastroesophageal reflux  disease)   . Gout   . HOH (hard of hearing)   . Internal hemorrhoids without mention of complication   . Lumbago   . Memory loss   . Mild CAD    a. Cath 2013: 20-30% prox RCA.  Marland Kitchen Myocardial infarction (HCC) 07/2015  . Nonspecific abnormal results of liver function study   . Obesity, unspecified   . Orthostatic hypotension   . Osteoarthritis   . Other and unspecified hyperlipidemia   . Pain in joint, shoulder region 11/22/2013   left   . Personal history of fall   . Presence of permanent cardiac pacemaker   . Pruritus 11/22/2013  . S/P cardiac pacemaker procedure, PPM Medtronic REVO placed 01/11/2012 01/11/2012   a. for symptomatic bradycardia. Followed by Dr. Ladona Ridgel.  . Shortness of breath dyspnea    with movement-  . Wears glasses     Family History  Problem Relation Age of Onset  . Breast cancer Mother   . Cancer Mother   . Diabetes Daughter   . Hypertension Daughter   . Cancer Brother     Social History   Social History  . Marital status: Widowed    Spouse name: N/A  . Number of children: N/A  . Years of education: N/A   Occupational History  . Not on file.   Social History Main Topics  . Smoking status: Never Smoker  . Smokeless tobacco: Never Used  . Alcohol use No  . Drug use: No  . Sexual activity: No   Other Topics Concern  . Not on  file   Social History Narrative   Physically inactive. She says she hurts in her back and knees too much to do much walking.   9th grade   Worked in [employer]   Living with daughter    Doesn't drive as of 98/1191    Past Surgical History:  Procedure Laterality Date  . CARDIAC CATHETERIZATION  12/03/2003   Dr Clarene Duke  . CARDIOVERSION N/A 07/25/2015   Procedure: CARDIOVERSION;  Surgeon: Pricilla Riffle, MD;  Location: [facility] ENDOSCOPY;  Service: Cardiovascular;  Laterality: N/A;  . CARDIOVERSION N/A 08/06/2015   Procedure: CARDIOVERSION;  Surgeon: Laurey Morale, MD;  Location: [facility] ENDOSCOPY;  Service: Cardiovascular;   Laterality: N/A;  . CATARACT EXTRACTION W/ INTRAOCULAR LENS  IMPLANT, BILATERAL Bilateral 03/2015-04/2015  . COLONOSCOPY     ?????  . EP IMPLANTABLE DEVICE N/A 04/15/2016   Procedure: Pacemaker Implant;  Surgeon: Marinus Maw, MD;  Location: [facility] INVASIVE CV LAB;  Service: Cardiovascular;  Laterality: N/A;  . ESOPHAGOGASTRODUODENOSCOPY N/A 08/08/2015   Procedure: ESOPHAGOGASTRODUODENOSCOPY (EGD);  Surgeon: Iva Boop, MD;  Location: [facility] ENDOSCOPY;  Service: Endoscopy;  Laterality: N/A;  . EXTREMITY CYST EXCISION     finger  . EYE SURGERY Bilateral    catartact  . INSERT / REPLACE / REMOVE PACEMAKER    . LEFT HEART CATHETERIZATION WITH CORONARY ANGIOGRAM N/A 01/10/2012   Procedure: LEFT HEART CATHETERIZATION WITH CORONARY ANGIOGRAM;  Surgeon: Chrystie Nose, MD;  Location: [facility] CATH LAB;  Service: Cardiovascular;  Laterality: N/A;  . ORIF WRIST FRACTURE Right 04/09/2014   Procedure: OPEN REDUCTION INTERNAL FIXATION (ORIF) RIGHT DISPLACED  DISTAL RADIUS  FRACTURE;  Surgeon: Dominica Severin, MD;  Location: [facility];  Service: Orthopedics;  Laterality: Right;  . PACEMAKER LEAD REMOVAL  04/12/2016   TEMPORARY PACEMAKER INSERTION  . PACEMAKER LEAD REMOVAL  04/15/2016   Procedure: Pacemaker Lead Removal;  Surgeon: Marinus Maw, MD;  Location: [facility] INVASIVE CV LAB;  Service: Cardiovascular;;  . PACEMAKER LEAD REMOVAL N/A 04/12/2016   Procedure: PACEMAKER LEAD REMOVAL;  Surgeon: Marinus Maw, MD;  Location: [facility] OR;  Service: Cardiovascular;  Laterality: N/A;  . PERMANENT PACEMAKER INSERTION Left 01/11/2012   Procedure: PERMANENT PACEMAKER INSERTION;  Surgeon: Thurmon Fair, MD;  Location: [facility] CATH LAB;  Service: Cardiovascular;  Laterality: Left;  . TEE WITHOUT CARDIOVERSION N/A 07/25/2015   Procedure: TRANSESOPHAGEAL ECHOCARDIOGRAM (TEE);  Surgeon: Pricilla Riffle, MD;  Location: [facility] ENDOSCOPY;  Service: Cardiovascular;  Laterality: N/A;        Physical Exam: Blood pressure 136/69, pulse  (!) 140, temperature 98.1 F (36.7 C), temperature source Oral, resp. rate 19, SpO2 96 %.   Affect appropriate Pale obese white female  HEENT: normal Neck supple with no adenopathy JVP normal no bruits no thyromegaly Lungs clear with no wheezing and good diaphragmatic motion Heart:  S1/S2 SEM  murmur, no rub, gallop or click PMI normal Abdomen: benighn, BS positve, no tenderness, no AAA no bruit.  No HSM or HJR Distal pulses intact with no bruits No edema Neuro non-focal Skin warm and dry No muscular weakness   Labs:   Lab Results  Component Value Date   WBC 7.7 02/14/2017   HGB 10.6 (L) 02/14/2017   HCT 34.8 (L) 02/14/2017   MCV 87.4 02/14/2017   PLT 152 02/14/2017     Recent Labs Lab 02/14/17 1205  NA 138  K 3.7  CL 98*  CO2 31  BUN 23*  CREATININE 1.65*  CALCIUM 8.8*  GLUCOSE 219*   Lab Results  Component Value Date   CKTOTAL 62 01/09/2012   CKMB 2.2 01/09/2012   TROPONINI 0.04 (H) 08/05/2015    Lab Results  Component Value Date   CHOL 128 05/12/2016   CHOL 115 04/28/2015   CHOL 116 12/16/2014   Lab Results  Component Value Date   HDL 50 05/12/2016   HDL 55 04/28/2015   HDL 58 12/16/2014   Lab Results  Component Value Date   LDLCALC 57 05/12/2016   LDLCALC 48 04/28/2015   LDLCALC 43 12/16/2014   Lab Results  Component Value Date   TRIG 104 05/12/2016   TRIG 61 04/28/2015   TRIG 77 12/16/2014   Lab Results  Component Value Date   CHOLHDL 2.6 05/12/2016   CHOLHDL 2.1 04/28/2015   CHOLHDL 2.0 12/16/2014   No results found for: LDLDIRECT    Radiology: Dg Chest 2 View  Result Date: 02/14/2017 CLINICAL DATA:  Chest pain EXAM: CHEST  2 VIEW COMPARISON:  June 13, 2016 FINDINGS: There is no edema or consolidation. Heart is borderline enlarged with pulmonary vascularity within normal limits. Pacemaker leads are attached to the right atrium and middle cardiac vein. No pneumothorax. No adenopathy. There is arthropathy in each shoulder as  well as in the thoracic spine. IMPRESSION: No edema or consolidation.  Stable cardiac silhouette. Electronically Signed   By: Bretta Bang III M.D.   On: 02/14/2017 12:52   Dg Ankle Complete Left  Result Date: 02/02/2017 CLINICAL DATA:  Fall 02/01/2017.  Left foot injury. EXAM: LEFT ANKLE COMPLETE - 3+ VIEW COMPARISON:  None. FINDINGS: Soft swelling is worse over the lateral malleolus. The ankle is located. No acute osseous abnormality is present. IMPRESSION: 1. Bimalleolar soft tissue swelling is worse over the lateral malleolus. Ligamentous injury is not excluded. 2. No acute osseous abnormality. Electronically Signed   By: Marin Roberts M.D.   On: 02/02/2017 16:38   Dg Foot Complete Left  Result Date: 02/02/2017 CLINICAL DATA:  Fall 02/01/2017.  Left foot injury. EXAM: LEFT FOOT - COMPLETE 3+ VIEW COMPARISON:  None. FINDINGS: Mild generalized osteopenia is present. Diffuse soft tissue swelling is noted. No acute or focal osseous abnormality is present. IMPRESSION: 1. No acute or focal osseous abnormality. 2. Is mild diffuse soft swelling may reflect extremity edema or cellulitis. Soft tissue injury is considered less likely. Electronically Signed   By: Marin Roberts M.D.   On: 02/02/2017 16:36    EKG: A paced RBBB no acute ST changes    ASSESSMENT AND PLAN:  Chest Pain: clearly related to choking on food. Previous cath 2005 and 2013 no CAD. Myovue 2016 ? Scar no ischemia Troponin only .04 and ECG no acute changes. Will order lexiscan myovue for am  DCM:  Obese volume status hard to judge EF by most recent echo 06/03/16 normal 55-60% continue home meds  GI:  Would consult [facility] in hospital for possible repeat EGD. She clearly describes chronic nausea and dysphagia. With likely iron deficiency anemia  Pacer: normal function   Murmur:  Aortic sclerosis on echo will update   PAF:  Continue low dose eliquis and amiodarone in NSR with a pacing   DM:  Discussed low carb  diet.  Target hemoglobin A1c is 6.5 or less.  Continue current medications. Likely contributes To dysmotility   Signed: Charlton Haws 02/14/2017, 6:24 PM

## 2017-02-14 NOTE — ED Notes (Signed)
Attempted report x 2 

## 2017-02-14 NOTE — ED Provider Notes (Signed)
[facility]-EMERGENCY DEPT Provider Note   CSN: [id number] Arrival date & time: 02/14/17  1138     History   Chief Complaint Chief Complaint  Patient presents with  . Chest Pain    HPI Danielle Benitez is a 83 y.o. female with PMHx of CHF, MI, GERD, HTN, esophageal dysmotility, DM, CKD, permanent pacemaker, Presents today by EMS from home with substernal chest pain that is localized to center chest since 8:30 am.  She describes her chest pain as constant, pressure, gradually improved, 5/10. She reports associated nausea, vomiting, cold, clammy skin at the time of episode and has since moderately improved. She reports taking nothing for her symptoms besides aspirin given to her by EMS which did not change her symptoms. She states she was eating rice this morning and started to choke and then started to have some chest tightness. She then proceeded to vomit twice which moderately relieved her pain. She states that she has something similar in the past and said she was told her esophagus was narrow. She reports taking blood thinner, Ahlquist's daily. She reports following up with her doctors as recommended. She denies any fevers, abdominal pain.   Patient has reportedly received 324 mg of aspirin with EMS.  Per EMR - previous echo 06/03/16  55-60%. Normal systolic function. Last cardiac cath 01/10/12     The history is provided by the patient and a relative (daughter). No language interpreter was used.    Past Medical History:  Diagnosis Date  . Abnormality of gait   . Anemia, unspecified   . Anticoagulated- Eliquis 07/31/2015  . Anxiety state, unspecified   . Aortic stenosis    a. Mild by echo 08/2013.  Marland Kitchen Atrial fibrillation - developed 07/11/15 by PPM interrogation, recurrent after DCCV 10/14 07/11/2015    recurrent after DCCV 10/14  . Bifascicular block    afib  . Chronic systolic CHF (congestive heart failure) (HCC)    a. Dx 07/2015 - EF 35-40%, unclear etiology  . CKD (chronic kidney  disease), stage III   . Depressive disorder, not elsewhere classified   . DM (diabetes mellitus), type 2, uncontrolled, with renal complications (HCC) 01/08/2012  . Eczema 11/22/2013  . Esophageal dysmotility 08/05/2015  . Essential hypertension   . Female stress incontinence 05/06/2015  . Full dentures   . GERD (gastroesophageal reflux disease)   . Gout   . HOH (hard of hearing)   . Internal hemorrhoids without mention of complication   . Lumbago   . Memory loss   . Mild CAD    a. Cath 2013: 20-30% prox RCA.  Marland Kitchen Myocardial infarction (HCC) 07/2015  . Nonspecific abnormal results of liver function study   . Obesity, unspecified   . Orthostatic hypotension   . Osteoarthritis   . Other and unspecified hyperlipidemia   . Pain in joint, shoulder region 11/22/2013   left   . Personal history of fall   . Presence of permanent cardiac pacemaker   . Pruritus 11/22/2013  . S/P cardiac pacemaker procedure, PPM Medtronic REVO placed 01/11/2012 01/11/2012   a. for symptomatic bradycardia. Followed by Dr. Ladona Ridgel.  . Shortness of breath dyspnea    with movement-  . Wears glasses     Patient Active Problem List   Diagnosis Date Noted  . Type 2 diabetes mellitus with diabetic neuropathy, unspecified (HCC) 01/26/2017  . Dysuria 09/21/2016  . Fall 06/25/2016  . Skin tear of elbow without complication 06/24/2016  . Injury of left shoulder and  upper arm 06/24/2016  . Unequal blood pressure in upper extremities 06/03/2016  . Cardiac pacemaker in situ 04/12/2016  . Cough 03/08/2016  . Abnormality of gait 03/05/2016  . Risk for falls 03/05/2016  . Insomnia 01/21/2016  . Chronic systolic congestive heart failure, NYHA class 3 (HCC) 01/20/2016  . LFT elevation 11/21/2015  . Weakness 11/06/2015  . Dysphagia   . Esophageal dysmotility 08/05/2015  . Anticoagulated- Eliquis 07/31/2015  . Normochromic normocytic anemia 07/31/2015  . Atrial fibrillation - 07/11/15 by PPM interrogation, recurrent 10/19  after DCCV 10/14 07/18/2015  . Moderate mitral regurgitation 07/18/2015  . Moderate tricuspid regurgitation 07/18/2015  . History of pacemaker - Medtronic 07/18/2015  . CKD (chronic kidney disease), stage III   . Orthostatic hypotension   . Essential hypertension   . Pericardial effusion 07/15/2015  . Female stress incontinence 05/06/2015  . Knee pain 12/18/2014  . Low back pain 02/27/2014  . Obese 02/27/2014  . Depression 02/27/2014  . Pain in joint, shoulder region 11/22/2013  . Eczema 11/22/2013  . S/P cardiac pacemaker procedure, PPM Medtronic REVO placed 01/11/2012 01/11/2012  . COPD bronchitis by CXR 01/10/2012  . Dyslipidemia 01/10/2012  . Mild CAD - 20-30% RCA in 2013 01/10/2012  . Fatigue 01/10/2012  . Sinus bradycardia, HR as low As 35 01/10/2012  . Mild aortic stenosis 01/10/2012  . SOB (shortness of breath) 01/08/2012  . DM, type 2, with renal complications  01/08/2012    Past Surgical History:  Procedure Laterality Date  . CARDIAC CATHETERIZATION  12/03/2003   Dr Clarene Duke  . CARDIOVERSION N/A 07/25/2015   Procedure: CARDIOVERSION;  Surgeon: Pricilla Riffle, MD;  Location: [facility] ENDOSCOPY;  Service: Cardiovascular;  Laterality: N/A;  . CARDIOVERSION N/A 08/06/2015   Procedure: CARDIOVERSION;  Surgeon: Laurey Morale, MD;  Location: [facility] ENDOSCOPY;  Service: Cardiovascular;  Laterality: N/A;  . CATARACT EXTRACTION W/ INTRAOCULAR LENS  IMPLANT, BILATERAL Bilateral 03/2015-04/2015  . COLONOSCOPY     ?????  . EP IMPLANTABLE DEVICE N/A 04/15/2016   Procedure: Pacemaker Implant;  Surgeon: Marinus Maw, MD;  Location: [facility] INVASIVE CV LAB;  Service: Cardiovascular;  Laterality: N/A;  . ESOPHAGOGASTRODUODENOSCOPY N/A 08/08/2015   Procedure: ESOPHAGOGASTRODUODENOSCOPY (EGD);  Surgeon: Iva Boop, MD;  Location: [facility] ENDOSCOPY;  Service: Endoscopy;  Laterality: N/A;  . EXTREMITY CYST EXCISION     finger  . EYE SURGERY Bilateral    catartact  . INSERT / REPLACE / REMOVE PACEMAKER      . LEFT HEART CATHETERIZATION WITH CORONARY ANGIOGRAM N/A 01/10/2012   Procedure: LEFT HEART CATHETERIZATION WITH CORONARY ANGIOGRAM;  Surgeon: Chrystie Nose, MD;  Location: [facility] CATH LAB;  Service: Cardiovascular;  Laterality: N/A;  . ORIF WRIST FRACTURE Right 04/09/2014   Procedure: OPEN REDUCTION INTERNAL FIXATION (ORIF) RIGHT DISPLACED  DISTAL RADIUS  FRACTURE;  Surgeon: Dominica Severin, MD;  Location: [facility];  Service: Orthopedics;  Laterality: Right;  . PACEMAKER LEAD REMOVAL  04/12/2016   TEMPORARY PACEMAKER INSERTION  . PACEMAKER LEAD REMOVAL  04/15/2016   Procedure: Pacemaker Lead Removal;  Surgeon: Marinus Maw, MD;  [facility] INVASIVE CV LAB;  Service: Cardiovascular;;  . PACEMAKER LEAD REMOVAL N/A 04/12/2016   Procedure: PACEMAKER LEAD REMOVAL;  Surgeon: Marinus Maw, MD;  Location: [facility] OR;  Service: Cardiovascular;  Laterality: N/A;  . PERMANENT PACEMAKER INSERTION Left 01/11/2012   Procedure: PERMANENT PACEMAKER INSERTION;  Surgeon: Thurmon Fair, MD;  Location: [facility] CATH LAB;  Service: Cardiovascular;  Laterality: Left;  . TEE  WITHOUT CARDIOVERSION N/A 07/25/2015   Procedure: TRANSESOPHAGEAL ECHOCARDIOGRAM (TEE);  Surgeon: Pricilla Riffle, MD;  Location: [facility] ENDOSCOPY;  Service: Cardiovascular;  Laterality: N/A;    OB History    No data available       Home Medications    Prior to Admission medications   Medication Sig Start Date End Date Taking? Authorizing Provider  acetaminophen (TYLENOL) 325 MG tablet Take 1-2 tablets (325-650 mg total) by mouth 3 (three) times daily as needed. 04/05/16  Yes Joaquim Nam, MD  albuterol (PROVENTIL HFA;VENTOLIN HFA) 108 (90 Base) MCG/ACT inhaler Inhale 2 puffs into the lungs every 4 (four) hours as needed for wheezing or shortness of breath (cough, shortness of breath or wheezing.). 10/15/16  Yes Emi Belfast, FNP  allopurinol (ZYLOPRIM) 300 MG tablet TAKE 1 TABLET BY MOUTH DAILY 11/24/16  Yes Joaquim Nam, MD   amiodarone (PACERONE) 200 MG tablet TAKE 1 TABLET BY MOUTH DAILY 11/24/16  Yes Marinus Maw, MD  atorvastatin (LIPITOR) 40 MG tablet TAKE ONE-HALF (0.5) TABLET BY MOUTH ONCEDAILY AT 6PM 12/27/16  Yes Joaquim Nam, MD  DULoxetine (CYMBALTA) 20 MG capsule TAKE ONE CAPSULE BY MOUTH EVERY NIGHT AT BEDTIME 01/06/17  Yes Joaquim Nam, MD  fluticasone [facility]) 50 MCG/ACT nasal spray Place 1 spray into both nostrils daily as needed for allergies or rhinitis.   Yes [provider]  furosemide (LASIX) 40 MG tablet Take 1 tablet (40 mg total) by mouth 2 (two) times daily. 10/25/16  Yes Laurey Morale, MD  GAS RELIEF 80 MG chewable tablet CHEW ONE TABLET BY MOUTH EVERY 6 HOURS AS NEEDED FOR GAS. 11/06/16  Yes Joaquim Nam, MD  glimepiride (AMARYL) 1 MG tablet TAKE 1 TABLET BY MOUTH DAILY WITH BREAKFAST 10/08/16  Yes Joaquim Nam, MD  Insulin Glargine (LANTUS SOLOSTAR) 100 UNIT/ML Solostar Pen Inject 5 Units into the skin daily at 10 pm. Patient taking differently: Inject 19 Units into the skin daily at 10 pm. If CBG is greater than 150 add 1 unit daily  If less than 100 subtract 1 unit from previous day If 100-150 then no change 01/27/17  Yes Joaquim Nam, MD  isosorbide mononitrate (IMDUR) 30 MG 24 hr tablet Take 1 tablet (30 mg total) by mouth daily. 09/08/16  Yes Clegg, Amy D, NP  loratadine (CLARITIN CHILDRENS) 5 MG chewable tablet Chew 1 tablet (5 mg total) by mouth daily. 01/26/17  Yes Joaquim Nam, MD  metoprolol succinate (TOPROL-XL) 50 MG 24 hr tablet TAKE 1 TABLET BY MOUTH TWICE A DAY 10/19/16  Yes Laurey Morale, MD  Multiple Vitamins-Minerals (ONE-A-DAY 50 PLUS PO) Take 1 capsule by mouth daily.   Yes [provider]  ONGLYZA 2.5 MG TABS tablet TAKE 1 TABLET BY MOUTH DAILY 02/02/17  Yes Joaquim Nam, MD  pantoprazole (PROTONIX) 40 MG tablet TAKE 1 TABLET BY MOUTH DAILY 06/25/16  Yes Joaquim Nam, MD  senna-docusate (SENOKOT-S) 8.6-50 MG tablet Take 1  tablet by mouth daily as needed for mild constipation. 07/28/15  Yes Barrett, Joline Salt, PA-C  traZODone (DESYREL) 50 MG tablet Take 0.5-1 tablets (25-50 mg total) by mouth at bedtime as needed for sleep. Don't take >50mg  in a day. 01/21/16  Yes Joaquim Nam, MD  ELIQUIS 2.5 MG TABS tablet TAKE 1 TABLET BY MOUTH TWICE A DAY 02/14/17   Bensimhon, Bevelyn Buckles, MD  Insulin Pen Needle (PEN NEEDLES) 30G X 5 MM MISC Inject 1 Dose  as directed daily. Use with insulin pen 01/27/17   Joaquim Nam, MD  ondansetron (ZOFRAN ODT) 4 MG disintegrating tablet Take 1 tablet (4 mg total) by mouth every 8 (eight) hours as needed for nausea or vomiting. Patient not taking: Reported on 02/14/2017 11/04/15   Doreene Nest, NP  ONE [facility] ULTRA TEST test strip USE TO CHECK BLOOD SUGAR ONCE A DAY AS DIRECTED 08/10/16   Joaquim Nam, MD    Family History Family History  Problem Relation Age of Onset  . Breast cancer Mother   . Cancer Mother   . Diabetes Daughter   . Hypertension Daughter   . Cancer Brother     Social History Social History  Substance Use Topics  . Smoking status: Never Smoker  . Smokeless tobacco: Never Used  . Alcohol use No     Allergies   Beta adrenergic blockers and Jardiance [empagliflozin]   Review of Systems Review of Systems  Constitutional: Negative for fever.       Clamminess  Respiratory: Positive for shortness of breath (mild (resolved)).   Cardiovascular: Positive for chest pain.  Gastrointestinal: Positive for nausea and vomiting (2 episodes). Negative for abdominal pain and diarrhea.  All other systems reviewed and are negative.    Physical Exam Updated Vital Signs BP 128/62   Pulse 73   Temp 98.1 F (36.7 C) (Oral)   Resp 16   SpO2 98%   Physical Exam  Constitutional: She appears well-developed and well-nourished.  Well appearing. Airway intact. No stridor, no drooling, no trismus. Handling secretions well. She appears in no apparent distress here.    HENT:  Head: Normocephalic and atraumatic.  Nose: Nose normal.  Mouth/Throat: Oropharynx is clear and moist.  Eyes: Conjunctivae and EOM are normal.  Neck: Normal range of motion.  Cardiovascular: Normal rate, normal heart sounds and intact distal pulses.   Pulmonary/Chest: Effort normal and breath sounds normal. No respiratory distress. She has no wheezes. She has no rales.  Normal work of breathing. No respiratory distress noted.   Abdominal: Soft. Bowel sounds are normal. There is no tenderness. There is no rebound and no guarding.  Soft and nontender. Negative Murphy sign. No focal tenderness at McBurney's point.  Musculoskeletal: Normal range of motion.  Pacemaker placed to right anterior chest wall. Left foot brace placed for prior lower leg injury.   Neurological: She is alert.  Skin: Skin is warm. Capillary refill takes 2 to 3 seconds.  Psychiatric: She has a normal mood and affect. Her behavior is normal.  Nursing note and vitals reviewed.    ED Treatments / Results  Labs (all labs ordered are listed, but only abnormal results are displayed) Labs Reviewed  BASIC METABOLIC PANEL - Abnormal; Notable for the following:       Result Value   Chloride 98 (*)    Glucose, Bld 219 (*)    BUN 23 (*)    Creatinine, Ser 1.65 (*)    Calcium 8.8 (*)    GFR calc non Af Amer 28 (*)    GFR calc Af Amer 32 (*)    All other components within normal limits  CBC - Abnormal; Notable for the following:    Hemoglobin 10.6 (*)    HCT 34.8 (*)    RDW 18.7 (*)    All other components within normal limits  I-STAT TROPOININ, ED    EKG  EKG Interpretation None       Radiology Dg Chest 2 View  Result Date: 02/14/2017 CLINICAL DATA:  Chest pain EXAM: CHEST  2 VIEW COMPARISON:  June 13, 2016 FINDINGS: There is no edema or consolidation. Heart is borderline enlarged with pulmonary vascularity within normal limits. Pacemaker leads are attached to the right atrium and middle cardiac  vein. No pneumothorax. No adenopathy. There is arthropathy in each shoulder as well as in the thoracic spine. IMPRESSION: No edema or consolidation.  Stable cardiac silhouette. Electronically Signed   By: Bretta Bang III M.D.   On: 02/14/2017 12:52    Procedures Procedures (including critical care time)  Medications Ordered in ED Medications - No data to display   Initial Impression / Assessment and Plan / ED Course  I have reviewed the triage vital signs and the nursing notes.  Pertinent labs & imaging results that were available during my care of the patient were reviewed by me and considered in my medical decision making (see chart for details).     The patient here with central chest pain that started at 8:30 this morning and has improved. Improved after 2 episodes of vomiting after feeling like she was going to choke on some food. Patient does have a history of esophageal dysmotility and has had endoscopy done and told to have small bites of food. She is in no apparent distress here, afebrile, hemodynamically stable. Heart and lung sounds are clear. No JVD. Abdomen soft and nontender. Labwork is similar to previous findings. 1st Troponin is negative. No acute abnormalities on EKG. X-ray shows no edema or consolidation. Patient's symptoms are almost resolved. Patient had no change in symptoms with aspirin. Patient's symptoms are likely due to her previous esophageal dysmotility or GERD. However due to her previous history of MI, comorbidities, pacemaker and the ACS rule out would be best in her case.  My attending, Dr. Rosalia Hammers also saw and evaluated patient who agreed with assessment and plan.   I spoke with Trish who agreed to see patient for ACS rule out.  Final Clinical Impressions(s) / ED Diagnoses   Final diagnoses:  Chest wall pain    New Prescriptions New Prescriptions   No medications on file     Alvina Chou, [place] 02/14/17 1533    Margarita Grizzle,  MD 02/19/17 [phone]

## 2017-02-14 NOTE — H&P (Addendum)
[name]:[id number] DOB: Aug 08, 1932 DOA: 02/14/2017     PCP: Joaquim Nam, MD   Outpatient Specialists: [facility]  Patient coming from:   home Lives with daughter   Chief Complaint: chest pain  HPI: Danielle Benitez is a 83 y.o. female with medical history significant of HTN, orthostatic hypotension, symptomatic bradycardia with Medtronic PPM, paroxysmal atrial fibrillation chronic systolic CHF, CKD stage III, and iron deficiency anemia, mild CAD, esophageal dysmotility, DM,    Presented with substernal chest pain that radiates up to her neck Started at 8:30 AM in the center of her chest constant pressure like  it has gradually improved. EMS was called and gave her aspirin. This was brought on by eating rice and having a choking episode. She has vomited twice and had had some chest tightness described as a knot in her chest thereafter. She was told in the past she has narrowing of hers esophagitis has trouble with dysphagia and esophageal dysmotility no associated fevers or chills cough or shortness of breath. Family reports she got clammy and cold.  She has trouble chewing her food due to poorly fitted dentures.  No current chest pain.  Regarding pertinent Chronic problems:    Last echo in 8/17 showed EF 55-60% with mild LVH, normal RV size and systolic function.  Hx of Afib on Eliquis TEE-guided DCCV in 10/16. She had DCCV again latera in 10/16 and was started on amiodarone. Last  Lexiscan Cardiolite (11/16) with EF 31%, basal inferior and inferolateral fixed defect possibly consistent with infarction, no ischemia; degree of LV hypokinesis was out of proportion to the perfusion defect last cardiac catheterization in April 2013 minimal disease  Leone Payor 07/2015 with barium swallow and EGD showing hiatal hernia and dysmotility. No stricture.    IN ER:  Temp (24hrs), Avg:98.1 F (36.7 C), Min:98.1 F (36.7 C), Max:98.1 F (36.7 C)     RR 14 satting 99% HR 70 BP  144/67 Troponin 0.00 sodium 138 potassium 3.7 glucose 219 BUN 23 creatinine 1.65 WBC 7.7 hemoglobin 10.6 platelets 152 Chest x-ray within normal limits Following Medications were ordered in ER: Medications - No data to display   ER provider discussed case with:  Cardiology who has seen patient in consult feels that this is most likely being noncardiac chest pain but could like medicine to admit for observation  Hospitalist was called for admission for chest pain likely noncardiac but given risk factors admitted for evaluation  Review of Systems:    Pertinent positives include: chest pain, choking episode  Constitutional:  No weight loss, night sweats, Fevers, chills, fatigue, weight loss  HEENT:  No headaches, Difficulty swallowing,Tooth/dental problems,Sore throat,  No sneezing, itching, ear ache, nasal congestion, post nasal drip,  Cardio-vascular:  No Orthopnea, PND, anasarca, dizziness, palpitations.no Bilateral lower extremity swelling  GI:  No heartburn, indigestion, abdominal pain, nausea, vomiting, diarrhea, change in bowel habits, loss of appetite, melena, blood in stool, hematemesis Resp:  no shortness of breath at rest. No dyspnea on exertion, No excess mucus, no productive cough, No non-productive cough, No coughing up of blood.No change in color of mucus.No wheezing. Skin:  no rash or lesions. No jaundice GU:  no dysuria, change in color of urine, no urgency or frequency. No straining to urinate.  No flank pain.  Musculoskeletal:  No joint pain or no joint swelling. No decreased range of motion. No back pain.  Psych:  No change in mood or affect. No depression or anxiety. No  memory loss.  Neuro: no localizing neurological complaints, no tingling, no weakness, no double vision, no gait abnormality, no slurred speech, no confusion  As per HPI otherwise 10 point review of systems negative.   Past Medical History: Past Medical History:  Diagnosis Date  .  Abnormality of gait   . Anemia, unspecified   . Anticoagulated- Eliquis 07/31/2015  . Anxiety state, unspecified   . Aortic stenosis    a. Mild by echo 08/2013.  Marland Kitchen Atrial fibrillation - developed 07/11/15 by PPM interrogation, recurrent after DCCV 10/14 07/11/2015    recurrent after DCCV 10/14  . Bifascicular block    afib  . Chronic systolic CHF (congestive heart failure) (HCC)    a. Dx 07/2015 - EF 35-40%, unclear etiology  . CKD (chronic kidney disease), stage III   . Depressive disorder, not elsewhere classified   . DM (diabetes mellitus), type 2, uncontrolled, with renal complications (HCC) 01/08/2012  . Eczema 11/22/2013  . Esophageal dysmotility 08/05/2015  . Essential hypertension   . Female stress incontinence 05/06/2015  . Full dentures   . GERD (gastroesophageal reflux disease)   . Gout   . HOH (hard of hearing)   . Internal hemorrhoids without mention of complication   . Lumbago   . Memory loss   . Mild CAD    a. Cath 2013: 20-30% prox RCA.  Marland Kitchen Myocardial infarction (HCC) 07/2015  . Nonspecific abnormal results of liver function study   . Obesity, unspecified   . Orthostatic hypotension   . Osteoarthritis   . Other and unspecified hyperlipidemia   . Pain in joint, shoulder region 11/22/2013   left   . Personal history of fall   . Presence of permanent cardiac pacemaker   . Pruritus 11/22/2013  . S/P cardiac pacemaker procedure, PPM Medtronic REVO placed 01/11/2012 01/11/2012   a. for symptomatic bradycardia. Followed by Dr. Ladona Ridgel.  . Shortness of breath dyspnea    with movement-  . Wears glasses    Past Surgical History:  Procedure Laterality Date  . CARDIAC CATHETERIZATION  12/03/2003   Dr Clarene Duke  . CARDIOVERSION N/A 07/25/2015   Procedure: CARDIOVERSION;  Surgeon: Pricilla Riffle, MD;  Location: [facility] ENDOSCOPY;  Service: Cardiovascular;  Laterality: N/A;  . CARDIOVERSION N/A 08/06/2015   Procedure: CARDIOVERSION;  Surgeon: Laurey Morale, MD;  Location: [facility]  ENDOSCOPY;  Service: Cardiovascular;  Laterality: N/A;  . CATARACT EXTRACTION W/ INTRAOCULAR LENS  IMPLANT, BILATERAL Bilateral 03/2015-04/2015  . COLONOSCOPY     ?????  . EP IMPLANTABLE DEVICE N/A 04/15/2016   Procedure: Pacemaker Implant;  Surgeon: Marinus Maw, MD;  Location: [facility] INVASIVE CV LAB;  Service: Cardiovascular;  Laterality: N/A;  . ESOPHAGOGASTRODUODENOSCOPY N/A 08/08/2015   Procedure: ESOPHAGOGASTRODUODENOSCOPY (EGD);  Surgeon: Iva Boop, MD;  Location: [facility] ENDOSCOPY;  Service: Endoscopy;  Laterality: N/A;  . EXTREMITY CYST EXCISION     finger  . EYE SURGERY Bilateral    catartact  . INSERT / REPLACE / REMOVE PACEMAKER    . LEFT HEART CATHETERIZATION WITH CORONARY ANGIOGRAM N/A 01/10/2012   Procedure: LEFT HEART CATHETERIZATION WITH CORONARY ANGIOGRAM;  Surgeon: Chrystie Nose, MD;  Location: [facility] CATH LAB;  Service: Cardiovascular;  Laterality: N/A;  . ORIF WRIST FRACTURE Right 04/09/2014   Procedure: OPEN REDUCTION INTERNAL FIXATION (ORIF) RIGHT DISPLACED  DISTAL RADIUS  FRACTURE;  Surgeon: Dominica Severin, MD;  Location: [facility];  Service: Orthopedics;  Laterality: Right;  . PACEMAKER LEAD REMOVAL  04/12/2016   TEMPORARY  PACEMAKER INSERTION  . PACEMAKER LEAD REMOVAL  04/15/2016   Procedure: Pacemaker Lead Removal;  Surgeon: Marinus Maw, MD;  Location: [facility] INVASIVE CV LAB;  Service: Cardiovascular;;  . PACEMAKER LEAD REMOVAL N/A 04/12/2016   Procedure: PACEMAKER LEAD REMOVAL;  Surgeon: Marinus Maw, MD;  Location: [facility] OR;  Service: Cardiovascular;  Laterality: N/A;  . PERMANENT PACEMAKER INSERTION Left 01/11/2012   Procedure: PERMANENT PACEMAKER INSERTION;  Surgeon: Thurmon Fair, MD;  Location: [facility] CATH LAB;  Service: Cardiovascular;  Laterality: Left;  . TEE WITHOUT CARDIOVERSION N/A 07/25/2015   Procedure: TRANSESOPHAGEAL ECHOCARDIOGRAM (TEE);  Surgeon: Pricilla Riffle, MD;  Location: [facility] ENDOSCOPY;  Service: Cardiovascular;  Laterality: N/A;     Social  History:  Ambulatory walker      reports that she has never smoked. She has never used smokeless tobacco. She reports that she does not drink alcohol or use drugs.  Allergies:   Allergies  Allergen Reactions  . Beta Adrenergic Blockers Other (See Comments)    Couldn't speak or hear Metoprolol seems ok  . Jardiance [Empagliflozin] Other (See Comments)    Rash, kidney issues       Family History:   Family History  Problem Relation Age of Onset  . Breast cancer Mother   . Cancer Mother   . Diabetes Daughter   . Hypertension Daughter   . Cancer Brother     Medications: Prior to Admission medications   Medication Sig Start Date End Date Taking? Authorizing Provider  acetaminophen (TYLENOL) 325 MG tablet Take 1-2 tablets (325-650 mg total) by mouth 3 (three) times daily as needed. 04/05/16  Yes Joaquim Nam, MD  albuterol (PROVENTIL HFA;VENTOLIN HFA) 108 (90 Base) MCG/ACT inhaler Inhale 2 puffs into the lungs every 4 (four) hours as needed for wheezing or shortness of breath (cough, shortness of breath or wheezing.). 10/15/16  Yes Emi Belfast, FNP  allopurinol (ZYLOPRIM) 300 MG tablet TAKE 1 TABLET BY MOUTH DAILY 11/24/16  Yes Joaquim Nam, MD  atorvastatin (LIPITOR) 40 MG tablet TAKE ONE-HALF (0.5) TABLET BY MOUTH ONCEDAILY AT 6PM 12/27/16  Yes Joaquim Nam, MD  DULoxetine (CYMBALTA) 20 MG capsule TAKE ONE CAPSULE BY MOUTH EVERY NIGHT AT BEDTIME 01/06/17  Yes Joaquim Nam, MD  fluticasone [facility]) 50 MCG/ACT nasal spray Place 1 spray into both nostrils daily as needed for allergies or rhinitis.   Yes [provider]  furosemide (LASIX) 40 MG tablet Take 1 tablet (40 mg total) by mouth 2 (two) times daily. 10/25/16  Yes Laurey Morale, MD  GAS RELIEF 80 MG chewable tablet CHEW ONE TABLET BY MOUTH EVERY 6 HOURS AS NEEDED FOR GAS. 11/06/16  Yes Joaquim Nam, MD  glimepiride (AMARYL) 1 MG tablet TAKE 1 TABLET BY MOUTH DAILY WITH BREAKFAST 10/08/16  Yes  Joaquim Nam, MD  Insulin Glargine (LANTUS SOLOSTAR) 100 UNIT/ML Solostar Pen Inject 5 Units into the skin daily at 10 pm. Patient taking differently: Inject 19 Units into the skin daily at 10 pm. If CBG is greater than 150 add 1 unit daily  If less than 100 subtract 1 unit from previous day If 100-150 then no change 01/27/17  Yes Joaquim Nam, MD  isosorbide mononitrate (IMDUR) 30 MG 24 hr tablet Take 1 tablet (30 mg total) by mouth daily. 09/08/16  Yes Clegg, Amy D, NP  loratadine (CLARITIN CHILDRENS) 5 MG chewable tablet Chew 1 tablet (5 mg total) by mouth daily. 01/26/17  Yes Joaquim Nam,  MD  metoprolol succinate (TOPROL-XL) 50 MG 24 hr tablet TAKE 1 TABLET BY MOUTH TWICE A DAY 10/19/16  Yes Laurey Morale, MD  Multiple Vitamins-Minerals (ONE-A-DAY 50 PLUS PO) Take 1 capsule by mouth daily.   Yes [provider]  ONGLYZA 2.5 MG TABS tablet TAKE 1 TABLET BY MOUTH DAILY 02/02/17  Yes Joaquim Nam, MD  pantoprazole (PROTONIX) 40 MG tablet TAKE 1 TABLET BY MOUTH DAILY 06/25/16  Yes Joaquim Nam, MD  senna-docusate (SENOKOT-S) 8.6-50 MG tablet Take 1 tablet by mouth daily as needed for mild constipation. 07/28/15  Yes Barrett, Joline Salt, PA-C  traZODone (DESYREL) 50 MG tablet Take 0.5-1 tablets (25-50 mg total) by mouth at bedtime as needed for sleep. Don't take >50mg  in a day. 01/21/16  Yes Joaquim Nam, MD  amiodarone (PACERONE) 200 MG tablet TAKE 1 TABLET BY MOUTH DAILY 02/14/17   Laurey Morale, MD  ELIQUIS 2.5 MG TABS tablet TAKE 1 TABLET BY MOUTH TWICE A DAY 02/14/17   Bensimhon, Bevelyn Buckles, MD  Insulin Pen Needle (PEN NEEDLES) 30G X 5 MM MISC Inject 1 Dose as directed daily. Use with insulin pen 01/27/17   Joaquim Nam, MD  ondansetron (ZOFRAN ODT) 4 MG disintegrating tablet Take 1 tablet (4 mg total) by mouth every 8 (eight) hours as needed for nausea or vomiting. Patient not taking: Reported on 02/14/2017 11/04/15   Doreene Nest, NP  ONE [facility] ULTRA TEST test  strip USE TO CHECK BLOOD SUGAR ONCE A DAY AS DIRECTED 08/10/16   Joaquim Nam, MD    Physical Exam: Patient Vitals for the past 24 hrs:  BP Temp Temp src Pulse Resp SpO2  02/14/17 1745 136/69 - - (!) 140 19 96 %  02/14/17 1730 130/75 - - (!) 158 19 91 %  02/14/17 1715 132/71 - - 70 (!) 21 99 %  02/14/17 1700 129/69 - - 70 (!) 21 92 %  02/14/17 1645 130/65 - - 68 16 100 %  02/14/17 1630 (!) 145/73 - - 70 13 98 %  02/14/17 1615 132/70 - - 69 18 97 %  02/14/17 1600 136/70 - - 70 13 96 %  02/14/17 1545 122/68 - - 69 14 97 %  02/14/17 1530 129/65 - - 69 15 96 %  02/14/17 1515 138/73 - - 70 19 96 %  02/14/17 1500 107/83 - - 70 (!) 22 97 %  02/14/17 1445 128/62 - - - - -  02/14/17 1430 116/68 - - - - -  02/14/17 1415 120/70 - - - - -  02/14/17 1400 127/87 - - 73 - 98 %  02/14/17 1345 113/72 - - 70 16 98 %  02/14/17 1315 100/71 - - 70 17 99 %  02/14/17 1300 114/71 - - 69 17 96 %  02/14/17 1251 - - - (!) 102 19 90 %  02/14/17 1249 123/73 - - 71 - 98 %  02/14/17 1149 121/65 98.1 F (36.7 C) Oral 69 - 97 %  02/14/17 1139 - - - - - 98 %    1. General:  in No Acute distress 2. Psychological: Alert and  Oriented 3. Head/ENT:   Moist  Mucous Membranes                          Head Non traumatic, neck supple  Poor Dentition 4. SKIN: normal  Skin turgor,  Skin clean Dry and intact no rash 5. Heart: Regular rate and rhythm  Murmur, Rub or gallop 6. Lungs:  no wheezes or crackles   7. Abdomen: Soft,  non-tender, Non distended 8. Lower extremities: no clubbing, cyanosis, or edema 9. Neurologically Grossly intact, moving all 4 extremities equally   10. MSK: Normal range of motion   body mass index is unknown because there is no height or weight on file.  Labs on Admission:   Labs on Admission: I have personally reviewed following labs and imaging studies  CBC:  Recent Labs Lab 02/14/17 1205  WBC 7.7  HGB 10.6*  HCT 34.8*  MCV 87.4  PLT 152    Basic Metabolic Panel:  Recent Labs Lab 02/14/17 1205  NA 138  K 3.7  CL 98*  CO2 31  GLUCOSE 219*  BUN 23*  CREATININE 1.65*  CALCIUM 8.8*   GFR: Estimated Creatinine Clearance: 25.2 mL/min (A) (by C-G formula based on SCr of 1.65 mg/dL (H)). Liver Function Tests: No results for input(s): AST, ALT, ALKPHOS, BILITOT, PROT, ALBUMIN in the last 168 hours. No results for input(s): LIPASE, AMYLASE in the last 168 hours. No results for input(s): AMMONIA in the last 168 hours. Coagulation Profile: No results for input(s): INR, PROTIME in the last 168 hours. Cardiac Enzymes: No results for input(s): CKTOTAL, CKMB, CKMBINDEX, TROPONINI in the last 168 hours. BNP (last 3 results)  Recent Labs  09/20/16 1458  PROBNP 140.0*   HbA1C: No results for input(s): HGBA1C in the last 72 hours. CBG: No results for input(s): GLUCAP in the last 168 hours. Lipid Profile: No results for input(s): CHOL, HDL, LDLCALC, TRIG, CHOLHDL, LDLDIRECT in the last 72 hours. Thyroid Function Tests: No results for input(s): TSH, T4TOTAL, FREET4, T3FREE, THYROIDAB in the last 72 hours. Anemia Panel: No results for input(s): VITAMINB12, FOLATE, FERRITIN, TIBC, IRON, RETICCTPCT in the last 72 hours. Sepsis Labs: @LABRCNTIP (procalcitonin:4,lacticidven:4) )No results found for this or any previous visit (from the past 240 hour(s)).     UA  not ordered  Lab Results  Component Value Date   HGBA1C 9.0 (H) 01/26/2017    Estimated Creatinine Clearance: 25.2 mL/min (A) (by C-G formula based on SCr of 1.65 mg/dL (H)).  BNP (last 3 results)  Recent Labs  09/20/16 1458  PROBNP 140.0*     ECG REPORT  Independently reviewed Rate: 71  Rhythm: paced ST&T Change: No acute ischemic changes   QTC 492  There were no vitals filed for this visit.   Cultures:    Component Value Date/Time   SDES URINE, CLEAN CATCH 08/02/2015 1156   SPECREQUEST NONE 08/02/2015 1156   CULT MULTIPLE SPECIES PRESENT,  SUGGEST RECOLLECTION 08/02/2015 1156   REPTSTATUS 08/03/2015 FINAL 08/02/2015 1156     Radiological Exams on Admission: Dg Chest 2 View  Result Date: 02/14/2017 CLINICAL DATA:  Chest pain EXAM: CHEST  2 VIEW COMPARISON:  June 13, 2016 FINDINGS: There is no edema or consolidation. Heart is borderline enlarged with pulmonary vascularity within normal limits. Pacemaker leads are attached to the right atrium and middle cardiac vein. No pneumothorax. No adenopathy. There is arthropathy in each shoulder as well as in the thoracic spine. IMPRESSION: No edema or consolidation.  Stable cardiac silhouette. Electronically Signed   By: Bretta Bang III M.D.   On: 02/14/2017 12:52    Chart has been reviewed    Assessment/Plan  83 y.o. female with medical history significant  of HTN, orthostatic hypotension, symptomatic bradycardia with Medtronic PPM, paroxysmal atrial fibrillation chronic systolic CHF, CKD stage III, and iron deficiency anemia, mild CAD, esophageal dysmotility, DM,admitted for chest pain  Present on Admission: . Atrial fibrillation - 07/11/15 by PPM interrogation, recurrent 10/19 after DCCV 10/14 -            - CHA2DS2 vas score 6 : continue current anticoagulation with  Eliquis           -  Rate control:  Currently controlled with  Metoprolol will continue        - Rhythm control:  Continue amiodarone  . Cardiac pacemaker in situ -  stable . Chronic systolic congestive heart failure, NYHA class 3 (HCC) - for now continue Lasix and follow fluid status . CKD (chronic kidney disease), stage III - creatinine appears to be stable around baseline . DM, type 2, with renal complications - sliding scale hold by mouth medications continue insulin diabetes coordinator consult . Dyslipidemia continue home medications . Essential hypertension stable continue home medications . Mild CAD - 20-30% RCA in 2013 cycle cardiac enzymes monitor on telemetry continue home medications .  Normochromic normocytic anemia stable at baseline . Chest pain appreciate cardiology input suspect possibly GI related as per their recommendation  Will obtain GI consult Dysphasia will have speech pathology evaluation Other plan as per orders.  DVT prophylaxis:  Eliquis   Code Status:  FULL CODE  as per patient    Family Communication:   Family  at  Bedside  plan of care was discussed with  Daughter Son and daughter in law Disposition Plan:    To home once workup is complete and patient is stable                         Would benefit from PT/OT eval prior to DC  ordered                      Nutrition  consulted                          Consults called: Cardiology has seen patient in consult while in ER GI Consulted spoke to Dr. Leone Payor will see in AM Admission status:   obs   Level of care    tele        I have spent a total of 56  min on this admission  Dayne Dekay 02/14/2017, 9:06 PM    [facility]  Pager [phone]   after 2 AM please page floor coverage PA If 7AM-7PM, please contact the day team taking care of the patient  [email or web address]  Password [email or web address]

## 2017-02-14 NOTE — ED Notes (Signed)
Paged [facility] for Dr.Dutova-

## 2017-02-14 NOTE — ED Notes (Signed)
Pt returns from xray. c/o pain at left side 5/10.  c/o headach 7/10.

## 2017-02-14 NOTE — ED Notes (Signed)
Attempted report x1. 

## 2017-02-15 ENCOUNTER — Observation Stay (HOSPITAL_COMMUNITY): Payer: Medicare Other

## 2017-02-15 ENCOUNTER — Observation Stay (HOSPITAL_BASED_OUTPATIENT_CLINIC_OR_DEPARTMENT_OTHER): Payer: Medicare Other

## 2017-02-15 DIAGNOSIS — I361 Nonrheumatic tricuspid (valve) insufficiency: Secondary | ICD-10-CM

## 2017-02-15 DIAGNOSIS — I480 Paroxysmal atrial fibrillation: Secondary | ICD-10-CM | POA: Diagnosis not present

## 2017-02-15 DIAGNOSIS — K449 Diaphragmatic hernia without obstruction or gangrene: Secondary | ICD-10-CM | POA: Diagnosis not present

## 2017-02-15 DIAGNOSIS — I130 Hypertensive heart and chronic kidney disease with heart failure and stage 1 through stage 4 chronic kidney disease, or unspecified chronic kidney disease: Secondary | ICD-10-CM | POA: Diagnosis not present

## 2017-02-15 DIAGNOSIS — E1122 Type 2 diabetes mellitus with diabetic chronic kidney disease: Secondary | ICD-10-CM | POA: Diagnosis not present

## 2017-02-15 DIAGNOSIS — K224 Dyskinesia of esophagus: Secondary | ICD-10-CM | POA: Diagnosis not present

## 2017-02-15 DIAGNOSIS — I5022 Chronic systolic (congestive) heart failure: Secondary | ICD-10-CM | POA: Diagnosis not present

## 2017-02-15 DIAGNOSIS — R1310 Dysphagia, unspecified: Secondary | ICD-10-CM | POA: Diagnosis not present

## 2017-02-15 DIAGNOSIS — Z950 Presence of cardiac pacemaker: Secondary | ICD-10-CM | POA: Diagnosis not present

## 2017-02-15 DIAGNOSIS — R0789 Other chest pain: Secondary | ICD-10-CM | POA: Diagnosis not present

## 2017-02-15 DIAGNOSIS — R079 Chest pain, unspecified: Secondary | ICD-10-CM | POA: Diagnosis not present

## 2017-02-15 DIAGNOSIS — K219 Gastro-esophageal reflux disease without esophagitis: Secondary | ICD-10-CM | POA: Diagnosis not present

## 2017-02-15 DIAGNOSIS — N183 Chronic kidney disease, stage 3 (moderate): Secondary | ICD-10-CM | POA: Diagnosis not present

## 2017-02-15 LAB — ECHOCARDIOGRAM COMPLETE
HEIGHTINCHES: 60 in
Weight: 3160 oz

## 2017-02-15 LAB — GLUCOSE, CAPILLARY
GLUCOSE-CAPILLARY: 162 mg/dL — AB (ref 65–99)
GLUCOSE-CAPILLARY: 123 mg/dL — AB (ref 65–99)
GLUCOSE-CAPILLARY: 343 mg/dL — AB (ref 65–99)
GLUCOSE-CAPILLARY: 252 mg/dL — AB (ref 65–99)
Glucose-Capillary: 79 mg/dL (ref 65–99)

## 2017-02-15 LAB — TROPONIN I
Troponin I: <0.03 ng/mL (ref <0.03)
Troponin I: <0.03 ng/mL (ref <0.03)

## 2017-02-15 LAB — PREALBUMIN: PREALBUMIN: 13.3 mg/dL — AB (ref 18–38)

## 2017-02-15 LAB — HEMOGLOBIN A1C
HEMOGLOBIN A1C: 8.9 % — AB (ref 4.8–5.6)
Mean Plasma Glucose: 209 mg/dL

## 2017-02-15 NOTE — Progress Notes (Addendum)
PROGRESS NOTE    Danielle Benitez  ZOX:[id number] DOB: Sep 22, 1932 DOA: 02/14/2017 PCP: Danielle Nam, MD  Brief Narrative:Danielle Benitez is a 83 y.o. female with medical history significant of HTN, orthostatic hypotension, symptomatic bradycardia s/p  Medtronic PPM, P.Afib, chronic systolic CHF, CKD stage III, and iron deficiency anemia, mild CAD, esophageal dysmotility, DM, presented to the ED with with substernal chest pain that radiates up to her neck brought on by eating rice and having a choking episode. Pt vomited twice and then had some chest tightness described as a knot in her chest thereafter. She was told in the past she has narrowing of her esophagus/dysphagia and esophageal dysmotility    Assessment & Plan:     Esophageal/chest pain -long standing h/o Dysphagia to solids and liquids, and meds -EGD in 2016 tortuous esophagus s/o dysmotility, esophagram 2016 unremarkable -[facility] consulted, ? Consider EGD, will hold eliquis -SLP evaluation -continue PPI    DM, type 2, with renal complications  -oral meds on hold, continue lantus, SSI for now    Mild CAD - 20-30% RCA in 2013 -stable, non cardiac chest pain, improved, troponin negative -continue Toprol, statin    CKD (chronic kidney disease), stage III -stable, better than recent baseline, monitor    Essential hypertension -stable    Atrial fibrillation - 07/11/15 by PPM interrogation, recurrent 10/19 after DCCV 10/14 -continue amiodarone, TOprol -hold eliquis for possible EGD    Normochromic normocytic anemia -stable    Chronic systolic congestive heart failure, NYHA class 3 (HCC) -appears euvolemic, continue PO lasix, toprol  DVT prophylaxis: hold eliquis, resume depending on Gi plans Code Status: Full COde Family Communication: None at bedside Disposition Plan:   Consultants:   [facility]     Subjective: Feels ok, has been having ongoing issues with dysphagia/swallowing  Objective: Vitals:   02/14/17 2131  02/14/17 2220 02/15/17 0511 02/15/17 0834  BP:  128/69 (!) 122/55 (!) (P) 154/84  Pulse:  70 70 (P) 72  Resp:  13 15 (P) 18  Temp:  97.5 F (36.4 C) 97.8 F (36.6 C) (P) 97.8 F (36.6 C)  TempSrc:  Oral Oral (P) Oral  SpO2: 93% 97% 97%   Weight:  86.9 kg (191 lb 9.6 oz) 89.6 kg (197 lb 8 oz)   Height:  5' (1.524 m)      Intake/Output Summary (Last 24 hours) at 02/15/17 1044 Last data filed at 02/15/17 0053  Gross per 24 hour  Intake              260 ml  Output              200 ml  Net               60 ml   Filed Weights   02/14/17 2220 02/15/17 0511  Weight: 86.9 kg (191 lb 9.6 oz) 89.6 kg (197 lb 8 oz)    Examination:  General exam: Appears calm and comfortable  Respiratory system: Clear to auscultation. Respiratory effort normal. Cardiovascular system: S1 & S2 heard, RRR. No JVD, murmurs, rubs, gallops or clicks. No pedal edema. Gastrointestinal system: Abdomen is nondistended, soft and nontender. No organomegaly or masses felt. Normal bowel sounds heard. Central nervous system: Alert and oriented. No focal neurological deficits. Extremities: Symmetric 5 x 5 power. Skin: No rashes, lesions or ulcers Psychiatry: Judgement and insight appear normal. Mood & affect appropriate.     Data Reviewed:   CBC:  Recent Labs Lab 02/14/17  1205  WBC 7.7  HGB 10.6*  HCT 34.8*  MCV 87.4  PLT 152   Basic Metabolic Panel:  Recent Labs Lab 02/14/17 1205  NA 138  K 3.7  CL 98*  CO2 31  GLUCOSE 219*  BUN 23*  CREATININE 1.65*  CALCIUM 8.8*   GFR: Estimated Creatinine Clearance: 25.7 mL/min (A) (by C-G formula based on SCr of 1.65 mg/dL (H)). Liver Function Tests: No results for input(s): AST, ALT, ALKPHOS, BILITOT, PROT, ALBUMIN in the last 168 hours. No results for input(s): LIPASE, AMYLASE in the last 168 hours. No results for input(s): AMMONIA in the last 168 hours. Coagulation Profile: No results for input(s): INR, PROTIME in the last 168 hours. Cardiac  Enzymes:  Recent Labs Lab 02/14/17 2306 02/15/17 0223 02/15/17 0712  TROPONINI <0.03 <0.03 <0.03   BNP (last 3 results)  Recent Labs  09/20/16 1458  PROBNP 140.0*   HbA1C: No results for input(s): HGBA1C in the last 72 hours. CBG:  Recent Labs Lab 02/14/17 2128 02/14/17 2314 02/15/17 0522 02/15/17 0755  GLUCAP 81 102* 162* 123*   Lipid Profile: No results for input(s): CHOL, HDL, LDLCALC, TRIG, CHOLHDL, LDLDIRECT in the last 72 hours. Thyroid Function Tests: No results for input(s): TSH, T4TOTAL, FREET4, T3FREE, THYROIDAB in the last 72 hours. Anemia Panel: No results for input(s): VITAMINB12, FOLATE, FERRITIN, TIBC, IRON, RETICCTPCT in the last 72 hours. Urine analysis:    Component Value Date/Time   COLORURINE YELLOW 08/02/2015 0330   APPEARANCEUR CLEAR 08/02/2015 0330   LABSPEC 1.009 08/02/2015 0330   PHURINE 5.0 08/02/2015 0330   GLUCOSEU NEGATIVE 08/02/2015 0330   HGBUR NEGATIVE 08/02/2015 0330   BILIRUBINUR 1+ 09/20/2016 1424   KETONESUR NEGATIVE 08/02/2015 0330   PROTEINUR 1+ 09/20/2016 1424   PROTEINUR NEGATIVE 08/02/2015 0330   UROBILINOGEN 0.2 09/20/2016 1424   UROBILINOGEN 0.2 08/02/2015 0330   NITRITE Neg 09/20/2016 1424   NITRITE NEGATIVE 08/02/2015 0330   LEUKOCYTESUR moderate (2+) (A) 09/20/2016 1424   Sepsis Labs: @LABRCNTIP (procalcitonin:4,lacticidven:4)  )No results found for this or any previous visit (from the past 240 hour(s)).       Radiology Studies: Dg Chest 2 View  Result Date: 02/14/2017 CLINICAL DATA:  Chest pain EXAM: CHEST  2 VIEW COMPARISON:  June 13, 2016 FINDINGS: There is no edema or consolidation. Heart is borderline enlarged with pulmonary vascularity within normal limits. Pacemaker leads are attached to the right atrium and middle cardiac vein. No pneumothorax. No adenopathy. There is arthropathy in each shoulder as well as in the thoracic spine. IMPRESSION: No edema or consolidation.  Stable cardiac silhouette.  Electronically Signed   By: Bretta Bang III M.D.   On: 02/14/2017 12:52        Scheduled Meds: . allopurinol  300 mg Oral Daily  . amiodarone  200 mg Oral Daily  . apixaban  2.5 mg Oral BID  . atorvastatin  40 mg Oral q1800  . DULoxetine  20 mg Oral QHS  . furosemide  40 mg Oral BID  . insulin aspart  0-9 Units Subcutaneous Q4H  . insulin glargine  19 Units Subcutaneous Q2200  . isosorbide mononitrate  30 mg Oral Daily  . loratadine  5 mg Oral Daily  . metoprolol succinate  50 mg Oral BID  . pantoprazole  40 mg Oral Daily   Continuous Infusions:   LOS: 0 days    Time spent:   Zannie Cove, MD [facility] Pager [phone]  If 7PM-7AM, please contact night-coverage  [email or web address] Password [facility] 02/15/2017, 10:44 AM

## 2017-02-15 NOTE — Consult Note (Signed)
[facility] Consult: 8:33 AM 02/15/2017  LOS: 0 days    Referring Provider: Dr  Jomarie Longs Primary Care Physician:  Joaquim Nam, MD Primary Gastroenterologist:  Dr. Leone Payor     Reason for Consultation:  dysphagia   HPI: Danielle Benitez is a 83 y.o. female.  PMH CHF.  A fib, on Eliquis.  s/p DCCV 07/2015.   Bradycardia, s/p PPM. Stage 3 CKD.  DM 2 on insulin.  HTN.   Chronic anemia.  Dysphagia, cause felt to be dysmotility.   Status post remote colonoscopy. Unable to find records. She tells me that she has diverticulosis. 07/2015 Esophagram:  Oblique pharyngeal imaging showed no aspiration or obstructive process. The thoracic esophagus has normal distensibility and course with no evidence of mass or stricture. A small hiatal hernia is present. Esophageal motility could be adequately assessed due to positioning, but primary propulsion wave was effective and there was no significant stasis. No encountered gastroesophageal reflux. The 13 mm barium tablet successfully traversed the esophagus. 07/2015 EGD for dysphagia.  Tortuous esophagus c/w dysmotility.  Small HH.  No dilation.    Patient describes long-standing dysphagia which includes solids as well as liquids, to solids are the bigger problem. She even has trouble swallowing her pills sometimes. She will have a sensation of choking which leads to coughing. Sometimes she regurgitates esophageal contents. She has not been modifying the texture of her diet. She does have a dry mouth and says it's a little bit hard for her to chew because she has a full set of dentures. Yesterday she had worse than usual dysphagia while eating rice which led to a sensation that something was stuck at the region of the lower esophageal sphincter. She felt nauseated and vomited up brown  liquid material. She felt clammy, she had chest pain.  Patient transported by EMS and admitted.  Troponins negative. No ischemic changes on EKG.  She completed bedside swallow evaluation per SLP.  Study reveals normal oropharyngeal swallow function with adequate mastication and brisk swallow response. No S/S aspiration. Complaints are felt to be consistent with esophageal dysphagia.  Patient also tells me she has a history of IBS. Previously she was more diarrhea prone but now she has a mixed pattern of constipation alternating with diarrhea. She uses, on as needed basis, both Senokot and antidiarrheals. She has a lot of flatus. She has some fecal soiling.  Past Medical History:  Diagnosis Date  . Abnormality of gait   . Anemia, unspecified   . Anticoagulated- Eliquis 07/31/2015  . Anxiety state, unspecified   . Aortic stenosis    a. Mild by echo 08/2013.  Marland Kitchen Atrial fibrillation - developed 07/11/15 by PPM interrogation, recurrent after DCCV 10/14 07/11/2015    recurrent after DCCV 10/14  . Bifascicular block    afib  . Chronic systolic CHF (congestive heart failure) (HCC)    a. Dx 07/2015 - EF 35-40%, unclear etiology  . CKD (chronic kidney disease), stage III   . Depressive disorder, not elsewhere classified   . DM (diabetes mellitus), type 2,  uncontrolled, with renal complications (HCC) 01/08/2012  . Eczema 11/22/2013  . Esophageal dysmotility 08/05/2015  . Essential hypertension   . Female stress incontinence 05/06/2015  . Full dentures   . GERD (gastroesophageal reflux disease)   . Gout   . HOH (hard of hearing)   . Internal hemorrhoids without mention of complication   . Lumbago   . Memory loss   . Mild CAD    a. Cath 2013: 20-30% prox RCA.  Marland Kitchen Myocardial infarction (HCC) 07/2015  . Nonspecific abnormal results of liver function study   . Obesity, unspecified   . Orthostatic hypotension   . Osteoarthritis   . Other and unspecified hyperlipidemia   . Pain in joint, shoulder  region 11/22/2013   left   . Personal history of fall   . Presence of permanent cardiac pacemaker   . Pruritus 11/22/2013  . S/P cardiac pacemaker procedure, PPM Medtronic REVO placed 01/11/2012 01/11/2012   a. for symptomatic bradycardia. Followed by Dr. Ladona Ridgel.  . Shortness of breath dyspnea    with movement-  . Wears glasses     Past Surgical History:  Procedure Laterality Date  . CARDIAC CATHETERIZATION  12/03/2003   Dr Clarene Duke  . CARDIOVERSION N/A 07/25/2015   Procedure: CARDIOVERSION;  Surgeon: Pricilla Riffle, MD;  Location: [facility] ENDOSCOPY;  Service: Cardiovascular;  Laterality: N/A;  . CARDIOVERSION N/A 08/06/2015   Procedure: CARDIOVERSION;  Surgeon: Laurey Morale, MD;  Location: [facility] ENDOSCOPY;  Service: Cardiovascular;  Laterality: N/A;  . CATARACT EXTRACTION W/ INTRAOCULAR LENS  IMPLANT, BILATERAL Bilateral 03/2015-04/2015  . COLONOSCOPY     ?????  . EP IMPLANTABLE DEVICE N/A 04/15/2016   Procedure: Pacemaker Implant;  Surgeon: Marinus Maw, MD;  Location: [facility] INVASIVE CV LAB;  Service: Cardiovascular;  Laterality: N/A;  . ESOPHAGOGASTRODUODENOSCOPY N/A 08/08/2015   Procedure: ESOPHAGOGASTRODUODENOSCOPY (EGD);  Surgeon: Iva Boop, MD;  Location: [facility] ENDOSCOPY;  Service: Endoscopy;  Laterality: N/A;  . EXTREMITY CYST EXCISION     finger  . EYE SURGERY Bilateral    catartact  . INSERT / REPLACE / REMOVE PACEMAKER    . LEFT HEART CATHETERIZATION WITH CORONARY ANGIOGRAM N/A 01/10/2012   Procedure: LEFT HEART CATHETERIZATION WITH CORONARY ANGIOGRAM;  Surgeon: Chrystie Nose, MD;  Location: [facility] CATH LAB;  Service: Cardiovascular;  Laterality: N/A;  . ORIF WRIST FRACTURE Right 04/09/2014   Procedure: OPEN REDUCTION INTERNAL FIXATION (ORIF) RIGHT DISPLACED  DISTAL RADIUS  FRACTURE;  Surgeon: Dominica Severin, MD;  Location: [facility];  Service: Orthopedics;  Laterality: Right;  . PACEMAKER LEAD REMOVAL  04/12/2016   TEMPORARY PACEMAKER INSERTION  . PACEMAKER LEAD REMOVAL   04/15/2016   Procedure: Pacemaker Lead Removal;  Surgeon: Marinus Maw, MD;  Location: [facility] INVASIVE CV LAB;  Service: Cardiovascular;;  . PACEMAKER LEAD REMOVAL N/A 04/12/2016   Procedure: PACEMAKER LEAD REMOVAL;  Surgeon: Marinus Maw, MD;  Location: [facility] OR;  Service: Cardiovascular;  Laterality: N/A;  . PERMANENT PACEMAKER INSERTION Left 01/11/2012   Procedure: PERMANENT PACEMAKER INSERTION;  Surgeon: Thurmon Fair, MD;  Location: [facility] CATH LAB;  Service: Cardiovascular;  Laterality: Left;  . TEE WITHOUT CARDIOVERSION N/A 07/25/2015   Procedure: TRANSESOPHAGEAL ECHOCARDIOGRAM (TEE);  Surgeon: Pricilla Riffle, MD;  Location: [facility] ENDOSCOPY;  Service: Cardiovascular;  Laterality: N/A;    Prior to Admission medications   Medication Sig Start Date End Date Taking? Authorizing Provider  acetaminophen (TYLENOL) 325 MG tablet Take 1-2 tablets (325-650 mg total) by mouth 3 (three) times  daily as needed. 04/05/16  Yes Joaquim Nam, MD  albuterol (PROVENTIL HFA;VENTOLIN HFA) 108 (90 Base) MCG/ACT inhaler Inhale 2 puffs into the lungs every 4 (four) hours as needed for wheezing or shortness of breath (cough, shortness of breath or wheezing.). 10/15/16  Yes Emi Belfast, FNP  allopurinol (ZYLOPRIM) 300 MG tablet TAKE 1 TABLET BY MOUTH DAILY 11/24/16  Yes Joaquim Nam, MD  atorvastatin (LIPITOR) 40 MG tablet TAKE ONE-HALF (0.5) TABLET BY MOUTH ONCEDAILY AT 6PM 12/27/16  Yes Joaquim Nam, MD  DULoxetine (CYMBALTA) 20 MG capsule TAKE ONE CAPSULE BY MOUTH EVERY NIGHT AT BEDTIME 01/06/17  Yes Joaquim Nam, MD  fluticasone [facility]) 50 MCG/ACT nasal spray Place 1 spray into both nostrils daily as needed for allergies or rhinitis.   Yes [provider]  furosemide (LASIX) 40 MG tablet Take 1 tablet (40 mg total) by mouth 2 (two) times daily. 10/25/16  Yes Laurey Morale, MD  GAS RELIEF 80 MG chewable tablet CHEW ONE TABLET BY MOUTH EVERY 6 HOURS AS NEEDED FOR GAS. 11/06/16  Yes Joaquim Nam, MD    glimepiride (AMARYL) 1 MG tablet TAKE 1 TABLET BY MOUTH DAILY WITH BREAKFAST 10/08/16  Yes Joaquim Nam, MD  Insulin Glargine (LANTUS SOLOSTAR) 100 UNIT/ML Solostar Pen Inject 5 Units into the skin daily at 10 pm. Patient taking differently: Inject 19 Units into the skin daily at 10 pm. If CBG is greater than 150 add 1 unit daily  If less than 100 subtract 1 unit from previous day If 100-150 then no change 01/27/17  Yes Joaquim Nam, MD  isosorbide mononitrate (IMDUR) 30 MG 24 hr tablet Take 1 tablet (30 mg total) by mouth daily. 09/08/16  Yes Clegg, Amy D, NP  loratadine (CLARITIN CHILDRENS) 5 MG chewable tablet Chew 1 tablet (5 mg total) by mouth daily. 01/26/17  Yes Joaquim Nam, MD  metoprolol succinate (TOPROL-XL) 50 MG 24 hr tablet TAKE 1 TABLET BY MOUTH TWICE A DAY 10/19/16  Yes Laurey Morale, MD  Multiple Vitamins-Minerals (ONE-A-DAY 50 PLUS PO) Take 1 capsule by mouth daily.   Yes [provider]  ONGLYZA 2.5 MG TABS tablet TAKE 1 TABLET BY MOUTH DAILY 02/02/17  Yes Joaquim Nam, MD  pantoprazole (PROTONIX) 40 MG tablet TAKE 1 TABLET BY MOUTH DAILY 06/25/16  Yes Joaquim Nam, MD  senna-docusate (SENOKOT-S) 8.6-50 MG tablet Take 1 tablet by mouth daily as needed for mild constipation. 07/28/15  Yes Barrett, Joline Salt, PA-C  traZODone (DESYREL) 50 MG tablet Take 0.5-1 tablets (25-50 mg total) by mouth at bedtime as needed for sleep. Don't take >50mg  in a day. 01/21/16  Yes Joaquim Nam, MD  amiodarone (PACERONE) 200 MG tablet TAKE 1 TABLET BY MOUTH DAILY 02/14/17   Laurey Morale, MD  ELIQUIS 2.5 MG TABS tablet TAKE 1 TABLET BY MOUTH TWICE A DAY 02/14/17   Bensimhon, Bevelyn Buckles, MD  Insulin Pen Needle (PEN NEEDLES) 30G X 5 MM MISC Inject 1 Dose as directed daily. Use with insulin pen 01/27/17   Joaquim Nam, MD  ondansetron (ZOFRAN ODT) 4 MG disintegrating tablet Take 1 tablet (4 mg total) by mouth every 8 (eight) hours as needed for nausea or vomiting. Patient  not taking: Reported on 02/14/2017 11/04/15   Doreene Nest, NP  ONE [facility] ULTRA TEST test strip USE TO CHECK BLOOD SUGAR ONCE A DAY AS DIRECTED 08/10/16   Joaquim Nam, MD  Scheduled Meds: . allopurinol  300 mg Oral Daily  . amiodarone  200 mg Oral Daily  . apixaban  2.5 mg Oral BID  . atorvastatin  40 mg Oral q1800  . DULoxetine  20 mg Oral QHS  . furosemide  40 mg Oral BID  . insulin aspart  0-9 Units Subcutaneous Q4H  . insulin glargine  19 Units Subcutaneous Q2200  . isosorbide mononitrate  30 mg Oral Daily  . loratadine  5 mg Oral Daily  . metoprolol succinate  50 mg Oral BID  . pantoprazole  40 mg Oral Daily   Infusions:  PRN Meds: acetaminophen, ondansetron (ZOFRAN) IV, senna-docusate, traZODone   Allergies as of 02/14/2017 - Review Complete 02/14/2017  Allergen Reaction Noted  . Beta adrenergic blockers Other (See Comments) 02/14/2008  . Jardiance [empagliflozin] Other (See Comments) 01/23/2015    Family History  Problem Relation Age of Onset  . Breast cancer Mother   . Cancer Mother   . Diabetes Daughter   . Hypertension Daughter   . Cancer Brother     Social History   Social History  . Marital status: Widowed    Spouse name: N/A  . Number of children: N/A  . Years of education: N/A   Occupational History  . Not on file.   Social History Main Topics  . Smoking status: Never Smoker  . Smokeless tobacco: Never Used  . Alcohol use No  . Drug use: No  . Sexual activity: No   Other Topics Concern  . Not on file   Social History Narrative   Physically inactive. She says she hurts in her back and knees too much to do much walking.   9th grade   Worked in multiple mills   Living with daughter    Doesn't drive as of 16/1096    REVIEW OF SYSTEMS: Constitutional:  Generally deconditioned. Her mobility is with a walker and after a recent sprain of her ankle, mobility is even more limited. ENT:  No nose bleeds Pulm:  No current shortness of  breath. Does cough when she has events of dysphagia. CV:  No palpitations, no LE edema.  Yesterday's chest pain has resolved. GU:  No hematuria, no frequency GI:  Per HPI Heme:  Develops purpura on her arms. Does not have excessive bleeding.   Transfusions:  Doesn't recall any recent transfusions. Neuro:  No headaches, no peripheral tingling or numbness Derm:  No itching, no rash or sores.  Endocrine:  No sweats or chills.  No polyuria or dysuria Immunization:  Reviewed. Travel:  None beyond local counties in last few months.    PHYSICAL EXAM: Vital signs in last 24 hours: Vitals:   02/14/17 2220 02/15/17 0511  BP: 128/69 (!) 122/55  Pulse: 70 70  Resp: 13 15  Temp: 97.5 F (36.4 C) 97.8 F (36.6 C)   Wt Readings from Last 3 Encounters:  02/15/17 89.6 kg (197 lb 8 oz)  02/02/17 86 kg (189 lb 8 oz)  01/26/17 86.4 kg (190 lb 8 oz)    General: Obese, somewhat anxious, non-ill appearing, elderly WF. Head:  No facial asymmetry or swelling.  Eyes:  No scleral icterus, no conjunctival pallor. Ears:  Intact hearing.  Nose:  No congestion or discharge. Mouth:  Slightly dry oral mucosa. Clear mucous membranes. Full dentures in place. Neck:  No JVD, no masses, no thyromegaly. Lungs:  No cough or dyspnea. Lungs with good breath sounds and clear. Heart: RRR. No MRG. S1, S2 present.  Abdomen:  Soft. Not tender or distended. No HSM, no hernias, masses. Bowel sounds active..   Rectal: Deferred.   Musc/Skeltl: No joint redness or swelling. Extremities:  No CCE.  Neurologic:  Patient is alert. Oriented times 3. Moves all 4 limbs, strength not tested. No tremor Skin:  Some purpura on the arms, no large hematomas. No telangiectasia. No suspicious lesions or sores. Nodes:  No cervical adenopathy.   Psych:  Pleasant, slightly anxious. Cooperative.  Intake/Output from previous day: 05/07 0701 - 05/08 0700 In: 260 [P.O.:260] Out: 200 [Urine:200] Intake/Output this shift: No intake/output  data recorded.  LAB RESULTS:  Recent Labs  02/14/17 1205  WBC 7.7  HGB 10.6*  HCT 34.8*  PLT 152   BMET Lab Results  Component Value Date   NA 138 02/14/2017   NA 136 01/20/2017   NA 134 (L) 11/08/2016   K 3.7 02/14/2017   K 4.4 01/20/2017   K 4.5 11/08/2016   CL 98 (L) 02/14/2017   CL 98 (L) 01/20/2017   CL 96 11/08/2016   CO2 31 02/14/2017   CO2 27 01/20/2017   CO2 28 11/08/2016   GLUCOSE 219 (H) 02/14/2017   GLUCOSE 252 (H) 01/20/2017   GLUCOSE 384 (H) 11/08/2016   BUN 23 (H) 02/14/2017   BUN 29 (H) 01/20/2017   BUN 23 11/08/2016   CREATININE 1.65 (H) 02/14/2017   CREATININE 1.85 (H) 01/20/2017   CREATININE 1.84 (H) 11/08/2016   CALCIUM 8.8 (L) 02/14/2017   CALCIUM 8.9 01/20/2017   CALCIUM 9.3 11/08/2016   LFT No results for input(s): PROT, ALBUMIN, AST, ALT, ALKPHOS, BILITOT, BILIDIR, IBILI in the last 72 hours. PT/INR Lab Results  Component Value Date   INR 1.16 04/12/2016   INR 1.15 07/17/2015   INR 1.06 01/10/2012   Hepatitis Panel No results for input(s): HEPBSAG, HCVAB, HEPAIGM, HEPBIGM in the last 72 hours. C-Diff No components found for: CDIFF Lipase  No results found for: LIPASE  Drugs of Abuse     Component Value Date/Time   LABOPIA NONE DETECTED 05/04/2010 1140   COCAINSCRNUR NONE DETECTED 05/04/2010 1140   LABBENZ POSITIVE (A) 05/04/2010 1140   AMPHETMU NONE DETECTED 05/04/2010 1140   THCU NONE DETECTED 05/04/2010 1140   LABBARB  05/04/2010 1140    NONE DETECTED        DRUG SCREEN FOR MEDICAL PURPOSES ONLY.  IF CONFIRMATION IS NEEDED FOR ANY PURPOSE, NOTIFY LAB WITHIN 5 DAYS.        LOWEST DETECTABLE LIMITS FOR URINE DRUG SCREEN Drug Class       Cutoff (ng/mL) Amphetamine      1000 Barbiturate      200 Benzodiazepine   200 Tricyclics       300 Opiates          300 Cocaine          300 THC              50     RADIOLOGY STUDIES: Dg Chest 2 View  Result Date: 02/14/2017 CLINICAL DATA:  Chest pain EXAM: CHEST  2 VIEW  COMPARISON:  June 13, 2016 FINDINGS: There is no edema or consolidation. Heart is borderline enlarged with pulmonary vascularity within normal limits. Pacemaker leads are attached to the right atrium and middle cardiac vein. No pneumothorax. No adenopathy. There is arthropathy in each shoulder as well as in the thoracic spine. IMPRESSION: No edema or consolidation.  Stable cardiac silhouette. Electronically Signed   By: Bretta Bang  III M.D.   On: 02/14/2017 12:52      IMPRESSION:   *  Dysphagia.  Hx of same with inconclusive EGD and esophagram in 2016 but Dr Leone Payor suspected dysmotility.    *  Afib, chronic Eliquis.  Last dose this AM.  *  IDDM  *  Chronic anemia.  hgb stable.  Baseline Hgb ~ 10.  Previously, but no longer, took iron.   Iron studies from 07/2015 reveal iron deficiency with low iron and ferritin of 9.  MCV currently 87.  *  CKD stage 3-4.       PLAN:     * case d/w Dr Marina Goodell.  Esophagram ordered.     Jennye Moccasin  02/15/2017, 8:33 AM Pager: [phone]  GI ATTENDING  History, laboratories, prior x-rays, prior endoscopy reports reviewed. Patient seen and examined. Agree with above comprehensive consultation note. We are asked to see the patient regarding dysphagia. Patient has been worked up for this about 18 months ago with Dr. Leone Payor. This included esophagram and EGD. Now describes what sounds like possible transient food impaction. On chronic anticoagulation therapy. No active symptoms at this time. Probably reasonable to repeat esophagram to rule out any obstructing process in the esophagus such as stricture or ring not appreciated previously. This is negative, continue with modified diet as previously recommended by Dr. Leone Payor. Patient is currently undergoing her radiographic examination. Thank you.  Wilhemina Bonito. Eda Keys., M.D. [facility] Division of Gastroenterology

## 2017-02-15 NOTE — Progress Notes (Signed)
  Echocardiogram 2D Echocardiogram has been performed.  Danielle Benitez 02/15/2017, 11:03 AM

## 2017-02-15 NOTE — Care Management Note (Signed)
Case Management Note  Patient Details  Name: Danielle Benitez MRN: [id number] Date of Birth: 1932-09-15  Subjective/Objective: Pt presented for Chest Pain and Afib. Pt is from home with daughter. Pt has DME cane and RW. Pt is currently active with [facility] for PT Services.                   Action/Plan: CM did call [facility] to make them aware that she is hospitalized. Plan for [facility] to be started on the 14th- per PCP. No HH orders needed at this time.   Expected Discharge Date:                  Expected Discharge Plan:  Home w Home Health Services  In-House Referral:  NA  Discharge planning Services  CM Consult  Post Acute Care Choice:  Home Health, Resumption of Svcs/PTA Provider Choice offered to:  Patient, Adult Children  DME Arranged:  N/A DME Agency:  NA  HH Arranged:  PT HH Agency:  [facility]  Status of Service:  Completed, signed off  If discussed at Long Length of Stay Meetings, dates discussed:    Additional Comments:  Gala Lewandowsky, RN 02/15/2017, 12:54 PM

## 2017-02-15 NOTE — Progress Notes (Signed)
Subjective:  Denies SSCP, palpitations or Dyspnea Thinks this is all indigestion   Objective:  Vitals:   02/14/17 2123 02/14/17 2131 02/14/17 2220 02/15/17 0511  BP:   128/69 (!) 122/55  Pulse:   70 70  Resp:   13 15  Temp:   97.5 F (36.4 C) 97.8 F (36.6 C)  TempSrc:   Oral Oral  SpO2: 95% 93% 97% 97%  Weight:   191 lb 9.6 oz (86.9 kg) 197 lb 8 oz (89.6 kg)  Height:   5' (1.524 m)     Intake/Output from previous day:  Intake/Output Summary (Last 24 hours) at 02/15/17 0725 Last data filed at 02/15/17 0053  Gross per 24 hour  Intake              260 ml  Output              200 ml  Net               60 ml    Physical Exam: Affect appropriate Obese pale white female  HEENT: normal Neck supple with no adenopathy JVP normal no bruits no thyromegaly Lungs clear with no wheezing and good diaphragmatic motion Heart:  S1/S2 no murmur, no rub, gallop or click pacer under left clavicle  PMI normal Abdomen: benighn, BS positve, no tenderness, no AAA no bruit.  No HSM or HJR Distal pulses intact with no bruits No edema Neuro non-focal Skin warm and dry No muscular weakness   Lab Results: Basic Metabolic Panel:  Recent Labs  83/38/25 1205  NA 138  K 3.7  CL 98*  CO2 31  GLUCOSE 219*  BUN 23*  CREATININE 1.65*  CALCIUM 8.8*   Liver Function Tests: No results for input(s): AST, ALT, ALKPHOS, BILITOT, PROT, ALBUMIN in the last 72 hours. No results for input(s): LIPASE, AMYLASE in the last 72 hours. CBC:  Recent Labs  02/14/17 1205  WBC 7.7  HGB 10.6*  HCT 34.8*  MCV 87.4  PLT 152   Cardiac Enzymes:  Recent Labs  02/14/17 2306 02/15/17 0223  TROPONINI <0.03 <0.03    Imaging: Dg Chest 2 View  Result Date: 02/14/2017 CLINICAL DATA:  Chest pain EXAM: CHEST  2 VIEW COMPARISON:  June 13, 2016 FINDINGS: There is no edema or consolidation. Heart is borderline enlarged with pulmonary vascularity within normal limits. Pacemaker leads are attached  to the right atrium and middle cardiac vein. No pneumothorax. No adenopathy. There is arthropathy in each shoulder as well as in the thoracic spine. IMPRESSION: No edema or consolidation.  Stable cardiac silhouette. Electronically Signed   By: Bretta Bang III M.D.   On: 02/14/2017 12:52    Cardiac Studies:  ECG:  SR RBBB no acute ECG changes   Telemetry:  NSR 02/15/2017   Echo: 8/17 EF 55-60% AV sclerosis   Medications:   . allopurinol  300 mg Oral Daily  . amiodarone  200 mg Oral Daily  . apixaban  2.5 mg Oral BID  . atorvastatin  40 mg Oral q1800  . DULoxetine  20 mg Oral QHS  . furosemide  40 mg Oral BID  . insulin aspart  0-9 Units Subcutaneous Q4H  . insulin glargine  19 Units Subcutaneous Q2200  . isosorbide mononitrate  30 mg Oral Daily  . loratadine  5 mg Oral Daily  . metoprolol succinate  50 mg Oral BID  . pantoprazole  40 mg Oral Daily  . regadenoson  0.4 mg  Intravenous Once      Assessment/Plan:  83 y.o. history of bradycardia with medtronic PPM, PAF chronic systolic CHF presumed non ischemic. Had normal cath 2005 and 2013. Last myovue 2016 suggested inferior basal scar no ischemia and EF 31% disproportionate to perfusion defect She has multiple co morbidities including CRF baseline Cr around 1.7 , iron deficiency anemia.  Chest Pain:  Atypical related to choking no need for further inpatient testing will arrange outpatient f/u with Dr Shirlee Latch Further w/u GI per primary service Has r/o with no acute ECG changes   Dysmotility; history of by barium swallow GI consult ? On pantoprazole may be related to DM  DM:  Discussed low carb diet.  Target hemoglobin A1c is 6.5 or less.  Continue current medications.  PAF:  main ting NSR continue low dose amiodarone and eliquis   Pacer:  Medtronic normal function   Charlton Haws 02/15/2017, 7:25 AM

## 2017-02-15 NOTE — Care Management Obs Status (Signed)
MEDICARE OBSERVATION STATUS NOTIFICATION   Patient Details  Name: Danielle Benitez MRN: [id number] Date of Birth: [birth year]   Medicare Observation Status Notification Given:  Yes    Gala Lewandowsky, RN 02/15/2017, 12:53 PM

## 2017-02-15 NOTE — Evaluation (Signed)
Clinical/Bedside Swallow Evaluation Patient Details  Name: CLEVIE FIGUERA MRN: [id number] Date of Birth: 1932-07-30  Today's Date: 02/15/2017 Time: SLP Start Time (ACUTE ONLY): 0945 SLP Stop Time (ACUTE ONLY): 1005 SLP Time Calculation (min) (ACUTE ONLY): 20 min  Past Medical History:  Past Medical History:  Diagnosis Date  . Abnormality of gait   . Anemia, unspecified   . Anticoagulated- Eliquis 07/31/2015  . Anxiety state, unspecified   . Aortic stenosis    a. Mild by echo 08/2013.  Marland Kitchen Atrial fibrillation - developed 07/11/15 by PPM interrogation, recurrent after DCCV 10/14 07/11/2015    recurrent after DCCV 10/14  . Bifascicular block    afib  . Chronic systolic CHF (congestive heart failure) (HCC)    a. Dx 07/2015 - EF 35-40%, unclear etiology  . CKD (chronic kidney disease), stage III   . Depressive disorder, not elsewhere classified   . DM (diabetes mellitus), type 2, uncontrolled, with renal complications (HCC) 01/08/2012  . Eczema 11/22/2013  . Esophageal dysmotility 08/05/2015  . Essential hypertension   . Female stress incontinence 05/06/2015  . Full dentures   . GERD (gastroesophageal reflux disease)   . Gout   . HOH (hard of hearing)   . Internal hemorrhoids without mention of complication   . Lumbago   . Memory loss   . Mild CAD    a. Cath 2013: 20-30% prox RCA.  Marland Kitchen Myocardial infarction (HCC) 07/2015  . Nonspecific abnormal results of liver function study   . Obesity, unspecified   . Orthostatic hypotension   . Osteoarthritis   . Other and unspecified hyperlipidemia   . Pain in joint, shoulder region 11/22/2013   left   . Personal history of fall   . Presence of permanent cardiac pacemaker   . Pruritus 11/22/2013  . S/P cardiac pacemaker procedure, PPM Medtronic REVO placed 01/11/2012 01/11/2012   a. for symptomatic bradycardia. Followed by Dr. Ladona Ridgel.  . Shortness of breath dyspnea    with movement-  . Wears glasses    Past Surgical History:  Past Surgical  History:  Procedure Laterality Date  . CARDIAC CATHETERIZATION  12/03/2003   Dr Clarene Duke  . CARDIOVERSION N/A 07/25/2015   Procedure: CARDIOVERSION;  Surgeon: Pricilla Riffle, MD;  Location: [facility] ENDOSCOPY;  Service: Cardiovascular;  Laterality: N/A;  . CARDIOVERSION N/A 08/06/2015   Procedure: CARDIOVERSION;  Surgeon: Laurey Morale, MD;  Location: [facility] ENDOSCOPY;  Service: Cardiovascular;  Laterality: N/A;  . CATARACT EXTRACTION W/ INTRAOCULAR LENS  IMPLANT, BILATERAL Bilateral 03/2015-04/2015  . COLONOSCOPY     ?????  . EP IMPLANTABLE DEVICE N/A 04/15/2016   Procedure: Pacemaker Implant;  Surgeon: Marinus Maw, MD;  Location: [facility] INVASIVE CV LAB;  Service: Cardiovascular;  Laterality: N/A;  . ESOPHAGOGASTRODUODENOSCOPY N/A 08/08/2015   Procedure: ESOPHAGOGASTRODUODENOSCOPY (EGD);  Surgeon: Iva Boop, MD;  Location: [facility] ENDOSCOPY;  Service: Endoscopy;  Laterality: N/A;  . EXTREMITY CYST EXCISION     finger  . EYE SURGERY Bilateral    catartact  . INSERT / REPLACE / REMOVE PACEMAKER    . LEFT HEART CATHETERIZATION WITH CORONARY ANGIOGRAM N/A 01/10/2012   Procedure: LEFT HEART CATHETERIZATION WITH CORONARY ANGIOGRAM;  Surgeon: Chrystie Nose, MD;  Location: [facility] CATH LAB;  Service: Cardiovascular;  Laterality: N/A;  . ORIF WRIST FRACTURE Right 04/09/2014   Procedure: OPEN REDUCTION INTERNAL FIXATION (ORIF) RIGHT DISPLACED  DISTAL RADIUS  FRACTURE;  Surgeon: Dominica Severin, MD;  Location: [facility];  Service: Orthopedics;  Laterality: Right;  .  PACEMAKER LEAD REMOVAL  04/12/2016   TEMPORARY PACEMAKER INSERTION  . PACEMAKER LEAD REMOVAL  04/15/2016   Procedure: Pacemaker Lead Removal;  Surgeon: Marinus Maw, MD;  Location: [facility] INVASIVE CV LAB;  Service: Cardiovascular;;  . PACEMAKER LEAD REMOVAL N/A 04/12/2016   Procedure: PACEMAKER LEAD REMOVAL;  Surgeon: Marinus Maw, MD;  Location: [facility] OR;  Service: Cardiovascular;  Laterality: N/A;  . PERMANENT PACEMAKER INSERTION Left 01/11/2012    Procedure: PERMANENT PACEMAKER INSERTION;  Surgeon: Thurmon Fair, MD;  Location: [facility] CATH LAB;  Service: Cardiovascular;  Laterality: Left;  . TEE WITHOUT CARDIOVERSION N/A 07/25/2015   Procedure: TRANSESOPHAGEAL ECHOCARDIOGRAM (TEE);  Surgeon: Pricilla Riffle, MD;  Location: [facility] ENDOSCOPY;  Service: Cardiovascular;  Laterality: N/A;   HPI:  83 y.o.femalewith medical history significant of HTN, orthostatic hypotension, symptomatic bradycardia with Medtronic PPM, paroxysmal atrial fibrillation chronic systolic CHF, CKD stage III, and iron deficiency anemia, mild CAD, esophageal dysmotility, DM admitted with c/o substernal chest pain.  07/2015 with barium swallow and EGD showing hiatal hernia and dysmotility. No stricture.   Assessment / Plan / Recommendation Clinical Impression  Pt presents with normal oropharyngeal swallow function: adequate mastication, brisk swallow response, no s/s of aspiration.  Her complaints are c/w esophageal dysphagia.  No SLP f/u warranted - GI following.   SLP Visit Diagnosis: Dysphagia, unspecified (R13.10)    Aspiration Risk  No limitations    Diet Recommendation     Medication Administration: Whole meds with liquid    Other  Recommendations Oral Care Recommendations: Oral care BID   Follow up Recommendations None      Frequency and Duration            Prognosis        Swallow Study   General Date of Onset: 02/14/17 HPI: 83 y.o.femalewith medical history significant of HTN, orthostatic hypotension, symptomatic bradycardia with Medtronic PPM, paroxysmal atrial fibrillation chronic systolic CHF, CKD stage III, and iron deficiency anemia, mild CAD, esophageal dysmotility, DM admitted with c/o substernal chest pain.  07/2015 with barium swallow and EGD showing hiatal hernia and dysmotility. No stricture. Type of Study: Bedside Swallow Evaluation Previous Swallow Assessment: no Diet Prior to this Study: Dysphagia 3 (soft);Thin liquids Temperature  Spikes Noted: No Respiratory Status: Room air History of Recent Intubation: No Behavior/Cognition: Alert;Cooperative Oral Cavity Assessment: Within Functional Limits Oral Care Completed by SLP: No Oral Cavity - Dentition: Dentures, top;Dentures, bottom Vision: Functional for self-feeding Self-Feeding Abilities: Able to feed self Patient Positioning: Upright in bed Baseline Vocal Quality: Normal Volitional Cough: Strong Volitional Swallow: Able to elicit    Oral/Motor/Sensory Function Overall Oral Motor/Sensory Function: Within functional limits   Ice Chips Ice chips: Within functional limits   Thin Liquid Thin Liquid: Within functional limits    Nectar Thick Nectar Thick Liquid: Not tested   Honey Thick Honey Thick Liquid: Not tested   Puree Puree: Within functional limits   Solid   GO   Solid: Within functional limits        Blenda Mounts Laurice 02/15/2017,10:10 AM

## 2017-02-15 NOTE — Evaluation (Signed)
Physical Therapy Evaluation Patient Details Name: Danielle Benitez MRN: [id number] DOB: 1931/10/21 Today's Date: 02/15/2017   History of Present Illness  Patient is a 83 y/o female who presents with chest pain associated an episode of choking and vomiting. GI following. EGD pending. PMH includes DM, MI, A-fib, pacer, chronic systolic CHF, CKD stage III, mild CAD, esophageal dysmotility,   Clinical Impression  Patient presents with generalized weakness, deconditioning, left ankle pain from previous sprain, balance deficits and impaired mobility s/p above. Tolerated gait training with Min A for balance/safety due to 1 LOB. Pt reports multiple falls at home. Has 24/7 S from daughter. HHPT plans to start after cleared by sport MD for ankle. Discussed fall reduction and safety techniques for home. Will follow acutely to maximize independence and mobility prior to return home.     Follow Up Recommendations Home health PT;Supervision for mobility/OOB (already setup per daughter)    Equipment Recommendations  None recommended by PT    Recommendations for Other Services       Precautions / Restrictions Precautions Precautions: Fall Required Braces or Orthoses: Other Brace/Splint Other Brace/Splint: air cast for LLE due to recent ankle sprain Restrictions Weight Bearing Restrictions: No      Mobility  Bed Mobility Overal bed mobility: Needs Assistance Bed Mobility: Supine to Sit;Sit to Supine     Supine to sit: Supervision;HOB elevated Sit to supine: Supervision;HOB elevated   General bed mobility comments: No assist needed, increased time and use of rail.  Transfers Overall transfer level: Needs assistance Equipment used: Rolling walker (2 wheeled) Transfers: Sit to/from Stand Sit to Stand: Min guard         General transfer comment: MIn guard for safety. Stood from [employer].   Ambulation/Gait Ambulation/Gait assistance: Min assist Ambulation Distance (Feet): 30 Feet Assistive  device: Rolling walker (2 wheeled) Gait Pattern/deviations: Step-through pattern;Decreased stance time - left;Decreased step length - right;Trunk flexed;Staggering left Gait velocity: decreased   General Gait Details: Slow, unsteady gait with decreased stance time LLE due to pain; MIn A for balance as pt with staggering to left x1 needing assist to correct.   Stairs            Wheelchair Mobility    Modified Rankin (Stroke Patients Only)       Balance Overall balance assessment: Needs assistance Sitting-balance support: Feet supported;No upper extremity supported Sitting balance-Leahy Scale: Fair Sitting balance - Comments: Total A to donn air cast LLE   Standing balance support: During functional activity;Bilateral upper extremity supported Standing balance-Leahy Scale: Poor Standing balance comment: Reliant on BUEs for support in standing.                              Pertinent Vitals/Pain Pain Assessment: Faces Faces Pain Scale: Hurts little more Pain Location: left ankle Pain Descriptors / Indicators: Sore Pain Intervention(s): Monitored during session;Repositioned;Limited activity within patient's tolerance    Home Living Family/patient expects to be discharged to:: Private residence Living Arrangements: Children Available Help at Discharge: Family;Available 24 hours/day Type of Home: Apartment Home Access: Level entry     Home Layout: One level Home Equipment: Walker - 2 wheels;Cane - quad;Cane - single point;Bedside commode;Walker - 4 wheels;Other (comment);Shower seat (adjustable bed)      Prior Function Level of Independence: Needs assistance   Gait / Transfers Assistance Needed: Pt uses RW for ambulation. Reports multiple falls.  ADL's / Homemaking Assistance Needed: Daughter assists with getting  in/out of tub. Pt does some cooking, laundry  Comments: Daughter works from home and provides 24/7 supervision.     Hand Dominance    Dominant Hand: Right    Extremity/Trunk Assessment   Upper Extremity Assessment Upper Extremity Assessment: Defer to OT evaluation    Lower Extremity Assessment Lower Extremity Assessment: LLE deficits/detail;Generalized weakness LLE Deficits / Details: ecchymoses left ankle, edema present, tender to palpation (recent sprain 2 weeks ago)       Communication   Communication: No difficulties  Cognition Arousal/Alertness: Awake/alert Behavior During Therapy: WFL for tasks assessed/performed Overall Cognitive Status: Within Functional Limits for tasks assessed                                        General Comments General comments (skin integrity, edema, etc.): Daughter present during session.    Exercises     Assessment/Plan    PT Assessment Patient needs continued PT services  PT Problem List Decreased strength;Decreased mobility;Decreased balance;Pain;Decreased activity tolerance;Decreased skin integrity       PT Treatment Interventions Therapeutic activities;Gait training;Therapeutic exercise;Patient/family education;Balance training;Functional mobility training;DME instruction    PT Goals (Current goals can be found in the Care Plan section)  Acute Rehab PT Goals Patient Stated Goal: to eat something and go home PT Goal Formulation: With patient Time For Goal Achievement: 03/01/17 Potential to Achieve Goals: Good    Frequency Min 3X/week   Barriers to discharge        Co-evaluation               AM-PAC PT "6 Clicks" Daily Activity  Outcome Measure Difficulty turning over in bed (including adjusting bedclothes, sheets and blankets)?: None Difficulty moving from lying on back to sitting on the side of the bed? : None Difficulty sitting down on and standing up from a chair with arms (e.g., wheelchair, bedside commode, etc,.)?: None Help needed moving to and from a bed to chair (including a wheelchair)?: A Little Help needed walking in  hospital room?: A Little Help needed climbing 3-5 steps with a railing? : A Lot 6 Click Score: 20    End of Session Equipment Utilized During Treatment: Gait belt Activity Tolerance: Patient limited by pain Patient left: in bed;with call bell/phone within reach;with family/visitor present Nurse Communication: Mobility status PT Visit Diagnosis: Unsteadiness on feet (R26.81);Pain Pain - Right/Left: Left Pain - part of body: Ankle and joints of foot    Time: 1330-1356 PT Time Calculation (min) (ACUTE ONLY): 26 min   Charges:   PT Evaluation $PT Eval Low Complexity: 1 Procedure PT Treatments $Gait Training: 8-22 mins   PT G Codes:   PT G-Codes **NOT FOR INPATIENT CLASS** Functional Assessment Tool Used: Clinical judgement Functional Limitation: Mobility: Walking and moving around Mobility: Walking and Moving Around Current Status (Z6109): At least 20 percent but less than 40 percent impaired, limited or restricted Mobility: Walking and Moving Around Goal Status [phone]): At least 1 percent but less than 20 percent impaired, limited or restricted    [place], [place], [place] [phone]    Marcy Panning 02/15/2017, 2:06 PM

## 2017-02-16 DIAGNOSIS — I5022 Chronic systolic (congestive) heart failure: Secondary | ICD-10-CM | POA: Diagnosis not present

## 2017-02-16 DIAGNOSIS — I480 Paroxysmal atrial fibrillation: Secondary | ICD-10-CM | POA: Diagnosis not present

## 2017-02-16 DIAGNOSIS — R1310 Dysphagia, unspecified: Secondary | ICD-10-CM | POA: Diagnosis not present

## 2017-02-16 DIAGNOSIS — K224 Dyskinesia of esophagus: Secondary | ICD-10-CM

## 2017-02-16 LAB — CBC
HCT: 33.8 % — ABNORMAL LOW (ref 36.0–46.0)
Hemoglobin: 10.2 g/dL — ABNORMAL LOW (ref 12.0–15.0)
MCH: 26 pg (ref 26.0–34.0)
MCHC: 30.2 g/dL (ref 30.0–36.0)
MCV: 86.2 fL (ref 78.0–100.0)
PLATELETS: 125 10*3/uL — AB (ref 150–400)
RBC: 3.92 MIL/uL (ref 3.87–5.11)
RDW: 18.6 % — AB (ref 11.5–15.5)
WBC: 7.3 10*3/uL (ref 4.0–10.5)

## 2017-02-16 LAB — BASIC METABOLIC PANEL
ANION GAP: 10 (ref 5–15)
BUN: 20 mg/dL (ref 6–20)
CO2: 28 mmol/L (ref 22–32)
Calcium: 8.8 mg/dL — ABNORMAL LOW (ref 8.9–10.3)
Chloride: 101 mmol/L (ref 101–111)
Creatinine, Ser: 1.52 mg/dL — ABNORMAL HIGH (ref 0.44–1.00)
GFR calc Af Amer: 35 mL/min — ABNORMAL LOW (ref >60)
GFR calc non Af Amer: 31 mL/min — ABNORMAL LOW (ref >60)
Glucose, Bld: 90 mg/dL (ref 65–99)
POTASSIUM: 3.3 mmol/L — AB (ref 3.5–5.1)
SODIUM: 139 mmol/L (ref 135–145)

## 2017-02-16 LAB — GLUCOSE, CAPILLARY
GLUCOSE-CAPILLARY: 82 mg/dL (ref 65–99)
GLUCOSE-CAPILLARY: 227 mg/dL — AB (ref 65–99)
Glucose-Capillary: 160 mg/dL — ABNORMAL HIGH (ref 65–99)
Glucose-Capillary: 338 mg/dL — ABNORMAL HIGH (ref 65–99)
Glucose-Capillary: 64 mg/dL — ABNORMAL LOW (ref 65–99)
Glucose-Capillary: 77 mg/dL (ref 65–99)

## 2017-02-16 MED ORDER — MULTIVITAMINS PO CAPS: 1.0000 | ORAL_CAPSULE | Freq: Every day | ORAL | 0 refills | Status: DC

## 2017-02-16 MED ORDER — FERROUS SULFATE 325 (65 FE) MG PO TABS: 325.0000 mg | ORAL_TABLET | Freq: Two times a day (BID) | ORAL | 0 refills | Status: DC

## 2017-02-16 NOTE — Progress Notes (Signed)
Hypoglycemic Event  CBG: 64 at 0354  Treatment: orange juice  Symptoms: Patient denied symptoms  Follow-up CBG: Time: 0413 CBG Result: 77  Possible Reasons for Event:   Comments/MD notified:    Leisel Pinette

## 2017-02-16 NOTE — Progress Notes (Signed)
Speech pathology late entry from 02/15/17  02/15/17 1000  SLP Visit Information  SLP Received On 02/15/17  Subjective  Subjective pleasant, good historian  General Information  Date of Onset 02/14/17  HPI 83 y.o.femalewith medical history significant of HTN, orthostatic hypotension, symptomatic bradycardia with Medtronic PPM, paroxysmal atrial fibrillation chronic systolic CHF, CKD stage III, and iron deficiency anemia, mild CAD, esophageal dysmotility, DM admitted with c/o substernal chest pain.  07/2015 with barium swallow and EGD showing hiatal hernia and dysmotility. No stricture.  Type of Study Bedside Swallow Evaluation  Previous Swallow Assessment no  Diet Prior to this Study Dysphagia 3 (soft);Thin liquids  Temperature Spikes Noted No  Respiratory Status Room air  History of Recent Intubation No  Behavior/Cognition Alert;Cooperative  Oral Cavity Assessment WFL  Oral Care Completed by SLP No  Oral Cavity - Dentition Dentures, top;Dentures, bottom  Vision Functional for self-feeding  Self-Feeding Abilities Able to feed self  Patient Positioning Upright in bed  Baseline Vocal Quality Normal  Volitional Cough Strong  Volitional Swallow Able to elicit  Oral Motor/Sensory Function  Overall Oral Motor/Sensory Function WFL  Ice Chips  Ice chips WFL  Thin Liquid  Thin Liquid WFL  Nectar Thick Liquid  Nectar Thick Liquid NT  Honey Thick Liquid  Honey Thick Liquid NT  Puree  Puree WFL  Solid  Solid WFL  SLP - End of Session  Patient left in bed;with call bell/phone within reach;with family/visitor present  SLP Assessment  Clinical Impression Statement (ACUTE ONLY) Pt presents with normal oropharyngeal swallow function: adequate mastication, brisk swallow response, no s/s of aspiration.  Her complaints are c/w esophageal dysphagia.  No SLP f/u warranted - GI following.    SLP Visit Diagnosis Dysphagia, unspecified (R13.10)  Impact on safety and function No limitations   Swallow Evaluation Recommendations  SLP Diet Recommendations Thin;Dysphagia 3 (mechanical soft)  Medication Administration Whole meds with liquid  Supervision Patient able to self feed  Postural Changes Seated upright at 90 degrees  Treatment Plan  Oral Care Recommendations Oral care BID  Treatment Recommendations No treatment recommended at this time  Follow up Recommendations None  Individuals Consulted  Consulted and Agree with Results and Recommendations Patient  SLP Time Calculation  SLP Start Time (ACUTE ONLY) 0945  SLP Stop Time (ACUTE ONLY) 1005  SLP Time Calculation (min) (ACUTE ONLY) 20 min  SLP G-Codes **NOT FOR INPATIENT CLASS**  Functional Assessment Tool Used clinical judgment  Functional Limitations Swallowing  Swallow Current Status (M0867) CI  Swallow Goal Status (Y1950) CI  Swallow Discharge Status (D3267) CI  SLP Evaluations  $ SLP Speech Visit 1 Procedure  SLP Evaluations  $BSS Swallow 1 Procedure

## 2017-02-16 NOTE — Progress Notes (Addendum)
Nutrition Consult -- Brief Note  RD received consult for assessment of nutrition status.  Weight stable, no significant weight changes over the past 9 months. Patient denies any weight loss. Nutrition-Focused physical exam completed. Findings are no fat depletion, no muscle depletion, and no edema.   Wt Readings from Last 15 Encounters:  02/16/17 188 lb 12.8 oz (85.6 kg)  02/02/17 189 lb 8 oz (86 kg)  01/26/17 190 lb 8 oz (86.4 kg)  01/20/17 190 lb 6 oz (86.4 kg)  10/25/16 191 lb (86.6 kg)  10/15/16 192 lb (87.1 kg)  09/20/16 191 lb 4 oz (86.8 kg)  08/20/16 189 lb (85.7 kg)  07/06/16 185 lb 6.4 oz (84.1 kg)  07/01/16 185 lb 12.8 oz (84.3 kg)  06/24/16 184 lb 4 oz (83.6 kg)  06/13/16 185 lb 4 oz (84 kg)  06/03/16 180 lb 4 oz (81.8 kg)  05/26/16 179 lb (81.2 kg)  05/12/16 180 lb 8 oz (81.9 kg)    Body mass index is 36.87 kg/m. Patient meets criteria for class 2 obesity based on current BMI.   Current diet order is dysphagia 3 with thin liquids, patient is consuming 100% of meals at this time.   Labs and medications reviewed. Prealbumin 13.3 (L). Low albumin or prealbumin is no longer used to diagnose malnutrition and are a poor indicator of nutrition status. These labs are reflective of inflammatory process, acute stress response, and levels are highly affected (decreased) by volume overload. [place] uses the new malnutrition guidelines published by the American Society for Parenteral and Enteral Nutrition (A.S.P.E.N.) and the Academy of Nutrition and Dietetics (AND).   Patient's weight has been stable, she is eating 100% of meals, and nutrition focused physical exam revealed no depletion of muscle or subcutaneous fat mass. Patient is well-nourished.  RD provided soft/dysphagia 3 diet education with handout per daughter's request.  No further nutrition interventions warranted at this time. If nutrition issues arise, please consult RD.   Joaquin Courts, RD, LDN, CNSC Pager  [phone] After Hours Pager [phone]

## 2017-02-16 NOTE — Progress Notes (Signed)
Inpatient Diabetes Program Recommendations  AACE/ADA: New Consensus Statement on Inpatient Glycemic Control (2015)  Target Ranges:  Prepandial:   less than 140 mg/dL      Peak postprandial:   less than 180 mg/dL (1-2 hours)      Critically ill patients:  140 - 180 mg/dL   Lab Results  Component Value Date   GLUCAP 82 02/16/2017   HGBA1C 8.9 (H) 02/14/2017    Review of Glycemic Control: Results for CALAYAH, ALLIO (MRN [id number]) as of 02/16/2017 09:57  Ref. Range 02/15/2017 07:55 02/15/2017 11:09 02/15/2017 17:20 02/15/2017 20:28 02/16/2017 00:15 02/16/2017 03:54 02/16/2017 04:13  Glucose-Capillary Latest Ref Range: 65 - 99 mg/dL 110 (H) 315 (H) 79 945 (H) 160 (H) 64 (L) 77   Diabetes history: Type 2 diabetes Outpatient Diabetes medications:  Amaryl 1 mg daily with breakfast, Lantus 19 units q HS Current orders for Inpatient glycemic control:  Novolog sensitive q 4 hours, Lantus 19 units q HS  Inpatient Diabetes Program Recommendations:   Please consider reducing Novolog correction to tid with meals.  Also consider adding Novolog meal coverage if patient eats >50%- 3 units tid with meals.     Thanks, Beryl Meager, RN, BC-ADM Inpatient Diabetes Coordinator Pager [phone] (8a-5p)

## 2017-02-16 NOTE — Discharge Instructions (Signed)
Dysphagia Diet Level 3, Mechanically Advanced °The dysphagia level 3 diet includes foods that are soft, moist, and can be chopped into 1-inch chunks. This diet is helpful for people with mild swallowing difficulties. It reduces the risk of food getting caught in the windpipe, trachea, or lungs. °What do I need to know about this diet? °· You may eat foods that are soft and moist. °· If you were on the dysphagia level 1 or level 2 diets, you may eat any of the foods included on those lists. °· Avoid foods that are dry, hard, sticky, chewy, coarse, and crunchy. Also avoid large cuts of food. °· Take small bites. Each bite should contain 1 inch or less of food. °· Thicken liquids if instructed by your health care provider. Follow your health care provider's instructions on how to do this and to what consistency. °· See your dietitian or speech language pathologist regularly for help with your dietary changes. °What foods can I eat? °Grains  °Moist breads without nuts or seeds. Biscuits, muffins, pancakes, and waffles well-moistened with syrup, jelly, margarine, or butter. Smooth cereals with plenty of milk to moisten them. Moist bread stuffing. Moist rice. °Vegetables  °All cooked, soft vegetables. Shredded lettuce. Tender fried potatoes. °Fruits  °All canned and cooked fruits. Soft, peeled fresh fruits, such as peaches, nectarines, kiwis, cantaloupe, honeydew melon, and watermelon without seeds. Soft berries, such as strawberries. °Meat and Other Protein Sources  °Moist ground or finely diced or sliced meats. Solid, tender cuts of meat. Meatloaf. Hamburger with a bun. Sausage patty. Deli thin-sliced lunch meat. Chicken, egg, or tuna salad sandwich. Sloppy joe. Moist fish. Eggs prepared any way. Casseroles with small chunks of meats, ground meats, or tender meats. °Dairy  °Cheese spreads without coarse large chunks. Shredded cheese. Cheese slices. Cottage cheese. Milk at the right texture. Smooth frappes. Yogurt  without nuts or coconut. Ask your health care provider whether you can have frozen desserts (such as malts or milk shakes) and thin liquids. °Sweets/Desserts  °Soft, smooth, moist desserts. Non-chewy, smooth candy. Jam. Jelly. Honey. Preserves. Ask your health care provider whether you can have frozen desserts. °Fats and Oils  °Butter. Oils. Margarine. Mayonnaise. Gravy. Spreads. °Other  °All seasonings and sweeteners. All sauces without large chunks. °The items listed above may not be a complete list of recommended foods or beverages. Contact your dietitian for more options.  °What foods are not recommended? °Grains  °Coarse or dry cereals. Dry breads. Toast. Crackers. Tough, crusty breads, such French bread and baguettes. Tough, crisp fried potatoes. Potato skins. Dry bread stuffing. Granola. Popcorn. Chips. °Vegetables  °All raw vegetables except shredded lettuce. Cooked corn. Rubbery or stiff cooked vegetables. Stringy vegetables, such as celery. °Fruits  °Hard fruits that are difficult to chew, such as apples or pears. Stringy, high-pulp fruits, such as pineapple, papaya, or mango. Fruits with tough skins, such as grapes. Coconut. All dried fruits. Fruit leather. Fruit roll-ups. Fruit snacks. °Meat and Other Protein Sources  °Dry or tough meats or poultry. Dry fish. Fish with bones. Peanut butter. All nuts and seeds. °Dairy  °Any with nuts, seeds, chocolate chips, dried fruit, coconut, or pineapple. °Sweets/Desserts  °Dry cakes. Chewy or dry cookies. Any with nuts, seeds, dry fruits, coconut, pineapple, or anything dry, sticky, or hard. Chewy caramel. Licorice. Taffy-type candies. Ask your health care provider whether you can have frozen desserts. °Fats and Oils  °Any with chunks, nuts, seeds, or pineapple. Olives. Pickles. °Other  °Soups with tough or large chunks   of meats, poultry, or vegetables. Corn or clam chowder. °The items listed above may not be a complete list of foods and beverages to avoid. Contact  your dietitian for more information.  °This information is not intended to replace advice given to you by your health care provider. Make sure you discuss any questions you have with your health care provider. °Document Released: 09/27/2005 Document Revised: 03/04/2016 Document Reviewed: 09/10/2013 °Elsevier Interactive Patient Education © 2017 [employer] ° °

## 2017-02-16 NOTE — Progress Notes (Signed)
Physical Therapy Treatment Patient Details Name: Danielle Benitez MRN: [id number] DOB: 1932/08/23 Today's Date: 02/16/2017    History of Present Illness Patient is a 83 y/o female who presents with chest pain associated an episode of choking and vomiting. GI following. EGD showed esophageal dysmotility with hiatal herna. PMH includes DM, MI, A-fib, pacer, chronic systolic CHF, CKD stage III, mild CAD, esophageal dysmotility,     PT Comments    Pt progressing towards towards goals, however, continues to be limited secondary to LLE pain. Required min guard for functional activities this session. Current recommendations remain appropriate. Will continue to follow to maximize functional mobility independence.    Follow Up Recommendations  Home health PT;Supervision for mobility/OOB (already set up per daughter )     Equipment Recommendations  None recommended by PT    Recommendations for Other Services       Precautions / Restrictions Precautions Precautions: Fall Required Braces or Orthoses: Other Brace/Splint Other Brace/Splint: air cast for LLE due to recent ankle sprain Restrictions Weight Bearing Restrictions: No    Mobility  Bed Mobility Overal bed mobility: Needs Assistance Bed Mobility: Supine to Sit;Sit to Supine     Supine to sit: Supervision;HOB elevated Sit to supine: Supervision;HOB elevated   General bed mobility comments: Required increased time and use of bed rails to perform   Transfers Overall transfer level: Needs assistance Equipment used: Rolling walker (2 wheeled) Transfers: Sit to/from Stand Sit to Stand: Min guard         General transfer comment: Min guard for safety. Verbal cues for hand placement   Ambulation/Gait Ambulation/Gait assistance: Min guard Ambulation Distance (Feet): 50 Feet Assistive device: Rolling walker (2 wheeled) Gait Pattern/deviations: Step-through pattern;Decreased stance time - left;Decreased step length - right;Trunk  flexed;Staggering left Gait velocity: decreased Gait velocity interpretation: Below normal speed for age/gender General Gait Details: Increased steadiness with gait, however, continues to exhibit some unsteadiness and antalgic gait secondary to LLE pain. Verbal cues for proximity to device. Pt family reports they have to remind her to stay within walker at home.    Stairs            Wheelchair Mobility    Modified Rankin (Stroke Patients Only)       Balance Overall balance assessment: Needs assistance Sitting-balance support: Feet supported;No upper extremity supported Sitting balance-Leahy Scale: Fair     Standing balance support: During functional activity;Bilateral upper extremity supported Standing balance-Leahy Scale: Poor Standing balance comment: Reliant on BUEs for support in standing.                             Cognition Arousal/Alertness: Awake/alert Behavior During Therapy: WFL for tasks assessed/performed Overall Cognitive Status: Within Functional Limits for tasks assessed                                        Exercises General Exercises - Lower Extremity Ankle Circles/Pumps: AROM;Both;10 reps;Supine Long Arc Quad: AROM;Both;10 reps;Seated Heel Slides: AROM;Both;10 reps;Supine    General Comments General comments (skin integrity, edema, etc.): [name] daughter present throughout session.       Pertinent Vitals/Pain Pain Assessment: Faces Faces Pain Scale: Hurts little more Pain Location: left ankle Pain Descriptors / Indicators: Sore Pain Intervention(s): Limited activity within patient's tolerance;Monitored during session;Repositioned    Home Living  Prior Function            PT Goals (current goals can now be found in the care plan section) Acute Rehab PT Goals Patient Stated Goal: to go home  PT Goal Formulation: With patient Time For Goal Achievement: 03/01/17 Potential to Achieve  Goals: Good Progress towards PT goals: Progressing toward goals    Frequency    Min 3X/week      PT Plan Current plan remains appropriate    Co-evaluation              AM-PAC PT "6 Clicks" Daily Activity  Outcome Measure  Difficulty turning over in bed (including adjusting bedclothes, sheets and blankets)?: None Difficulty moving from lying on back to sitting on the side of the bed? : None Difficulty sitting down on and standing up from a chair with arms (e.g., wheelchair, bedside commode, etc,.)?: None Help needed moving to and from a bed to chair (including a wheelchair)?: A Little Help needed walking in hospital room?: A Little Help needed climbing 3-5 steps with a railing? : A Lot 6 Click Score: 20    End of Session Equipment Utilized During Treatment: Gait belt;Other (comment) (L air cast) Activity Tolerance: Patient limited by pain Patient left: in bed;with call bell/phone within reach;with family/visitor present Nurse Communication: Mobility status PT Visit Diagnosis: Unsteadiness on feet (R26.81);Pain Pain - Right/Left: Left Pain - part of body: Ankle and joints of foot     Time: 1444-1457 PT Time Calculation (min) (ACUTE ONLY): 13 min  Charges:  $Gait Training: 8-22 mins                    G Codes:  Functional Assessment Tool Used: Clinical judgement Functional Limitation: Mobility: Walking and moving around Mobility: Walking and Moving Around Current Status [phone]): At least 20 percent but less than 40 percent impaired, limited or restricted Mobility: Walking and Moving Around Goal Status [phone]): At least 1 percent but less than 20 percent impaired, limited or restricted    Margot Chimes, PT, DPT  Acute Rehabilitation Services  Pager: [phone]    Melvyn Novas 02/16/2017, 3:16 PM

## 2017-02-16 NOTE — Progress Notes (Signed)
Patient ID: Danielle Benitez, female   DOB: [birth year], 83 y.o.   MRN: [id number]    SUBJECTIVE: No complaints this morning, feels good.   Esophagram yesterday showed evidence for esophageal dysmotility with hiatal herna.  Echo: EF 60-65%, no significant abnormalities.   Scheduled Meds: . allopurinol  300 mg Oral Daily  . amiodarone  200 mg Oral Daily  . atorvastatin  40 mg Oral q1800  . DULoxetine  20 mg Oral QHS  . furosemide  40 mg Oral BID  . insulin aspart  0-9 Units Subcutaneous Q4H  . insulin glargine  19 Units Subcutaneous Q2200  . isosorbide mononitrate  30 mg Oral Daily  . loratadine  5 mg Oral Daily  . metoprolol succinate  50 mg Oral BID  . pantoprazole  40 mg Oral Daily   Continuous Infusions: PRN Meds:.acetaminophen, ondansetron (ZOFRAN) IV, senna-docusate, traZODone     Vitals:   02/15/17 0834 02/15/17 1722 02/15/17 2031 02/16/17 0401  BP: (!) 154/84 (!) 155/79 140/72 (!) 143/70  Pulse: 72 70 70 70  Resp: 18 18 16 16   Temp: 97.8 F (36.6 C) 98.2 F (36.8 C) 97.9 F (36.6 C) 97.6 F (36.4 C)  TempSrc: Oral Oral Oral Oral  SpO2:   100% 96%  Weight:    188 lb 12.8 oz (85.6 kg)  Height:        Intake/Output Summary (Last 24 hours) at 02/16/17 0816 Last data filed at 02/16/17 0500  Gross per 24 hour  Intake              598 ml  Output                0 ml  Net              598 ml    LABS: Basic Metabolic Panel:  Recent Labs  40/98/11 1205 02/16/17 0540  NA 138 139  K 3.7 3.3*  CL 98* 101  CO2 31 28  GLUCOSE 219* 90  BUN 23* 20  CREATININE 1.65* 1.52*  CALCIUM 8.8* 8.8*   Liver Function Tests: No results for input(s): AST, ALT, ALKPHOS, BILITOT, PROT, ALBUMIN in the last 72 hours. No results for input(s): LIPASE, AMYLASE in the last 72 hours. CBC:  Recent Labs  02/14/17 1205 02/16/17 0540  WBC 7.7 7.3  HGB 10.6* 10.2*  HCT 34.8* 33.8*  MCV 87.4 86.2  PLT 152 125*   Cardiac Enzymes:  Recent Labs  02/14/17 2306 02/15/17 0223  02/15/17 0712  TROPONINI <0.03 <0.03 <0.03   BNP: Invalid input(s): POCBNP D-Dimer: No results for input(s): DDIMER in the last 72 hours. Hemoglobin A1C:  Recent Labs  02/14/17 2306  HGBA1C 8.9*   Fasting Lipid Panel: No results for input(s): CHOL, HDL, LDLCALC, TRIG, CHOLHDL, LDLDIRECT in the last 72 hours. Thyroid Function Tests: No results for input(s): TSH, T4TOTAL, T3FREE, THYROIDAB in the last 72 hours.  Invalid input(s): FREET3 Anemia Panel: No results for input(s): VITAMINB12, FOLATE, FERRITIN, TIBC, IRON, RETICCTPCT in the last 72 hours.  RADIOLOGY: Dg Chest 2 View  Result Date: 02/14/2017 CLINICAL DATA:  Chest pain EXAM: CHEST  2 VIEW COMPARISON:  June 13, 2016 FINDINGS: There is no edema or consolidation. Heart is borderline enlarged with pulmonary vascularity within normal limits. Pacemaker leads are attached to the right atrium and middle cardiac vein. No pneumothorax. No adenopathy. There is arthropathy in each shoulder as well as in the thoracic spine. IMPRESSION: No edema or consolidation.  Stable cardiac  silhouette. Electronically Signed   By: Bretta Bang III M.D.   On: 02/14/2017 12:52   Dg Ankle Complete Left  Result Date: 02/02/2017 CLINICAL DATA:  Fall 02/01/2017.  Left foot injury. EXAM: LEFT ANKLE COMPLETE - 3+ VIEW COMPARISON:  None. FINDINGS: Soft swelling is worse over the lateral malleolus. The ankle is located. No acute osseous abnormality is present. IMPRESSION: 1. Bimalleolar soft tissue swelling is worse over the lateral malleolus. Ligamentous injury is not excluded. 2. No acute osseous abnormality. Electronically Signed   By: Marin Roberts M.D.   On: 02/02/2017 16:38   Dg Esophagus  Result Date: 02/15/2017 CLINICAL DATA:  Difficulty swallowing. EXAM: ESOPHOGRAM/BARIUM SWALLOW TECHNIQUE: Single contrast examination was performed using  thin barium. FLUOROSCOPY TIME:  Fluoroscopy Time:  2 minutes and 54 seconds. Radiation Exposure Index  (if provided by the fluoroscopic device): 21.3 mGy Number of Acquired Spot Images: COMPARISON:  08/05/2015. FINDINGS: Study was performed as a single contrast exam given patient medical condition and in mobility. Oblique views of the hypopharynx while swallowing are unremarkable. Assessment of the esophagus demonstrates prominent tertiary contractions in the distal half of the esophagus with evidence of presbyesophagus. There is a small hiatal hernia with apparent restricted flow of barium through the diaphragmatic hiatus into the infra diaphragmatic portions of the stomach. No gross mass lesion can be identified. There is no evidence for esophageal diverticulum. Multiple episodes of gastroesophageal reflux were observed. A 13 mm barium tablet did pass into the stomach when taken with water. IMPRESSION: 1. Nonspecific esophageal motility disorder with prominent tertiary contractions and presbyesophagus. 2. Small hiatal hernia with some apparent restriction of flow into the infra diaphragmatic stomach although this may in part be positional. 3. Gastroesophageal reflux. Electronically Signed   By: Kennith Center M.D.   On: 02/15/2017 15:23   Dg Foot Complete Left  Result Date: 02/02/2017 CLINICAL DATA:  Fall 02/01/2017.  Left foot injury. EXAM: LEFT FOOT - COMPLETE 3+ VIEW COMPARISON:  None. FINDINGS: Mild generalized osteopenia is present. Diffuse soft tissue swelling is noted. No acute or focal osseous abnormality is present. IMPRESSION: 1. No acute or focal osseous abnormality. 2. Is mild diffuse soft swelling may reflect extremity edema or cellulitis. Soft tissue injury is considered less likely. Electronically Signed   By: Marin Roberts M.D.   On: 02/02/2017 16:36    PHYSICAL EXAM General: NAD Neck: No JVD, no thyromegaly or thyroid nodule.  Lungs: Clear to auscultation bilaterally with normal respiratory effort. CV: Nondisplaced PMI.  Heart regular S1/S2, no S3/S4, no murmur.  No peripheral  edema.  Abdomen: Soft, nontender, no hepatosplenomegaly, no distention.  Neurologic: Alert and oriented x 3.  Psych: Normal affect. Extremities: No clubbing or cyanosis.   TELEMETRY: Personally reviewed telemetry pt in a-paced, v-sensed (no atrial fibrillation)  ASSESSMENT AND PLAN: 83 yo with paroxysmal atrial fibrillation and history of cardiomyopathy (EF normalized), CKD III, tachy-brady syndrome with PPM presented with dysphagia and chest pain.  1. Chest pain/dysphagia: Think this is a GI issue, not cardiac.  Barium swallow with esophageal dysmotility and hiatal hernia.  Further workup (?EGD) per GI.  2. Atrial fibrillation: Paroxysmal, she has not been in atrial fibrillation in the hospital.  Continue amiodarone.  Restart DOAC if she will not have an EGD.  3. Chronic diastolic CHF: EF normal on echo this admission.  She looks euvolemic, continue current Lasix.   We will sign off if no further cardiac issues develop.  She should go home on the same  cardiac meds she came to the hospital on.  She has followup with me in the office.   [name] 02/16/2017 8:22 AM

## 2017-02-16 NOTE — Progress Notes (Signed)
Daily Rounding Note  02/16/2017, 10:03 AM  LOS: 0 days   SUBJECTIVE:   Chief complaint: dysphagia.  It is better on D3 diet.      Wondering if she can go home  OBJECTIVE:         Vital signs in last 24 hours:    Temp:  [97.6 F (36.4 C)-98.2 F (36.8 C)] 97.6 F (36.4 C) (05/09 0401) Pulse Rate:  [70] 70 (05/09 0401) Resp:  [16-18] 16 (05/09 0401) BP: (140-155)/(70-79) 143/70 (05/09 0401) SpO2:  [96 %-100 %] 96 % (05/09 0401) Weight:  [85.6 kg (188 lb 12.8 oz)] 85.6 kg (188 lb 12.8 oz) (05/09 0401) Last BM Date: 02/15/17 Filed Weights   02/14/17 2220 02/15/17 0511 02/16/17 0401  Weight: 86.9 kg (191 lb 9.6 oz) 89.6 kg (197 lb 8 oz) 85.6 kg (188 lb 12.8 oz)   General: looks well.  Comfortable.  alert   Heart: regular paced rhythm.   Chest: clear bil Abdomen: soft, NT.  Active BS.  Extremities: no CCE.  Neuro/Psych:  Oriented x 3.  Moves all 4 limbs.  Ambulating independently.    Intake/Output from previous day: 05/08 0701 - 05/09 0700 In: 598 [P.O.:598] Out: -   Intake/Output this shift: No intake/output data recorded.  Lab Results:  Recent Labs  02/14/17 1205 02/16/17 0540  WBC 7.7 7.3  HGB 10.6* 10.2*  HCT 34.8* 33.8*  PLT 152 125*   BMET  Recent Labs  02/14/17 1205 02/16/17 0540  NA 138 139  K 3.7 3.3*  CL 98* 101  CO2 31 28  GLUCOSE 219* 90  BUN 23* 20  CREATININE 1.65* 1.52*  CALCIUM 8.8* 8.8*   LFT No results for input(s): PROT, ALBUMIN, AST, ALT, ALKPHOS, BILITOT, BILIDIR, IBILI in the last 72 hours. PT/INR No results for input(s): LABPROT, INR in the last 72 hours. Hepatitis Panel No results for input(s): HEPBSAG, HCVAB, HEPAIGM, HEPBIGM in the last 72 hours.  Studies/Results: Dg Chest 2 View  Result Date: 02/14/2017 CLINICAL DATA:  Chest pain EXAM: CHEST  2 VIEW COMPARISON:  June 13, 2016 FINDINGS: There is no edema or consolidation. Heart is borderline enlarged with  pulmonary vascularity within normal limits. Pacemaker leads are attached to the right atrium and middle cardiac vein. No pneumothorax. No adenopathy. There is arthropathy in each shoulder as well as in the thoracic spine. IMPRESSION: No edema or consolidation.  Stable cardiac silhouette. Electronically Signed   By: Bretta Bang III M.D.   On: 02/14/2017 12:52   Dg Esophagus  Result Date: 02/15/2017 CLINICAL DATA:  Difficulty swallowing. EXAM: ESOPHOGRAM/BARIUM SWALLOW TECHNIQUE: Single contrast examination was performed using  thin barium. FLUOROSCOPY TIME:  Fluoroscopy Time:  2 minutes and 54 seconds. Radiation Exposure Index (if provided by the fluoroscopic device): 21.3 mGy Number of Acquired Spot Images: COMPARISON:  08/05/2015. FINDINGS: Study was performed as a single contrast exam given patient medical condition and in mobility. Oblique views of the hypopharynx while swallowing are unremarkable. Assessment of the esophagus demonstrates prominent tertiary contractions in the distal half of the esophagus with evidence of presbyesophagus. There is a small hiatal hernia with apparent restricted flow of barium through the diaphragmatic hiatus into the infra diaphragmatic portions of the stomach. No gross mass lesion can be identified. There is no evidence for esophageal diverticulum. Multiple episodes of gastroesophageal reflux were observed. A 13 mm barium tablet did pass into the stomach when taken with water. IMPRESSION: 1.  Nonspecific esophageal motility disorder with prominent tertiary contractions and presbyesophagus. 2. Small hiatal hernia with some apparent restriction of flow into the infra diaphragmatic stomach although this may in part be positional. 3. Gastroesophageal reflux. Electronically Signed   By: Kennith Center M.D.   On: 02/15/2017 15:23    ASSESMENT:   * Dysphagia.  Esophagram confirms suspected esophageal dysmotility, presbyesophagus.  Small HH.  GERD.  No esophageal stricture.      *  Chronic anemia, normocytic.  Previous IDA in 07/2015.          PLAN   *  Dietary texture modification:  D3 diet.  Continue MVI with iron at home.  GI follow up PRN.  Findings d/w pt and dtr, both feel reassured that nothing ominous is causing swallowing problems.   Jennye Moccasin  02/16/2017, 10:03 AM Pager: [phone]  GI ATTENDING  Interval history data reviewed. Patient personally seen and examined. Agree with interval progress note as outlined above. Family in room. Patient without complaints. Feels well. Tolerating current diet. I reviewed her swallowing study with her. Continue modified diet for esophageal dysmotility. Okay to go home from GI standpoint. Outpatient follow-up with Dr. Leone Payor as needed. Thank you.  Wilhemina Bonito. Eda Keys., M.D. [facility] Division of Gastroenterology

## 2017-02-16 NOTE — Consult Note (Signed)
[facility] CM Primary Care Navigator  02/16/2017  Danielle Benitez 1932-03-15 [id number]   Met with patient and daughter Kalman Shan) in the roomto identify possible discharge needs. Patient and daughter reports having chest pain/ tightness, choking and vomiting that had led to this admission. Patient endorses Dr. Elsie Stain with [place] [facility] as the primary care provider.   Patient shared using [facility] in [place] to obtain medications without difficulty.   Patient's daughter manages her medications at home using "pill box" system weekly.   Patient lives with daughter (and family) who is very involved and supportive of her care. Daughterprovidestransportation to her doctors' appointments.  Her daughter is the primary caregiver at home.  Anticipated discharge plan is home with home health services that was previously arranged ([facility]) per daughter.  Patient and daughter voiced understanding to call primary care provider's office when she returns home, for a post discharge follow-up appointment within a week or sooner if needed.Patient letter (with PCP's contact number) was provided as a reminder.  Explained to patient and daughter about [facility] CM services available for healthmanagement (particularly HF,DM and COPD)but sheverbalized that it is being managed at home with her daughter's help, monitored by primary care provider (PCP)every 3 months with recent A1c of 8.9. Per daughter, blood sugar and weight are checked daily and recorded. She follows up with Dr. Damita Dunnings (PCP) and Dr. Aundra Dubin (cardio) when needed. Patient and daughter expressed full awareness of ways in managing her chronic health conditions.  [facility] care management contact information provided for future needs that may arise.   For questions, please contact:  Dannielle Huh, BSN, RN- [facility] Primary Care Navigator  Telephone: [phone] [place]

## 2017-02-17 ENCOUNTER — Ambulatory Visit (INDEPENDENT_AMBULATORY_CARE_PROVIDER_SITE_OTHER): Payer: Medicare Other | Admitting: *Deleted

## 2017-02-17 ENCOUNTER — Encounter: Payer: Medicare Other | Admitting: Family Medicine

## 2017-02-17 DIAGNOSIS — R001 Bradycardia, unspecified: Secondary | ICD-10-CM | POA: Diagnosis not present

## 2017-02-18 ENCOUNTER — Telehealth: Payer: Self-pay | Admitting: *Deleted

## 2017-02-18 LAB — CUP PACEART REMOTE DEVICE CHECK
Battery Voltage: 3.02 V
Brady Statistic AP VP Percent: 0.09 %
Brady Statistic AP VS Percent: 99.9 %
Brady Statistic AS VP Percent: 0 %
Brady Statistic RA Percent Paced: 99.98 %
Brady Statistic RV Percent Paced: 0.11 %
Date Time Interrogation Session: 20180510140222
Implantable Lead Implant Date: 20170706
Implantable Lead Implant Date: 20170706
Implantable Lead Location: 753859
Implantable Lead Model: 5076
Implantable Pulse Generator Implant Date: 20170706
Lead Channel Impedance Value: 589 Ohm
Lead Channel Impedance Value: 684 Ohm
Lead Channel Pacing Threshold Amplitude: 1 V
Lead Channel Pacing Threshold Pulse Width: 0.4 ms
Lead Channel Sensing Intrinsic Amplitude: 5.25 mV
Lead Channel Sensing Intrinsic Amplitude: 5.25 mV
Lead Channel Setting Pacing Pulse Width: 0.4 ms
Lead Channel Setting Sensing Sensitivity: 2.8 mV
MDC IDC LEAD LOCATION: 753860
MDC IDC MSMT BATTERY REMAINING LONGEVITY: 105 mo
MDC IDC MSMT LEADCHNL RA IMPEDANCE VALUE: 380 Ohm
MDC IDC MSMT LEADCHNL RA IMPEDANCE VALUE: 494 Ohm
MDC IDC MSMT LEADCHNL RA SENSING INTR AMPL: 3 mV
MDC IDC MSMT LEADCHNL RA SENSING INTR AMPL: 3 mV
MDC IDC MSMT LEADCHNL RV PACING THRESHOLD AMPLITUDE: 0.875 V
MDC IDC MSMT LEADCHNL RV PACING THRESHOLD PULSEWIDTH: 0.4 ms
MDC IDC SET LEADCHNL RA PACING AMPLITUDE: 2 V
MDC IDC SET LEADCHNL RV PACING AMPLITUDE: 2.5 V
MDC IDC STAT BRADY AS VS PERCENT: 0.01 %

## 2017-02-18 NOTE — Telephone Encounter (Signed)
Transition Care Management Follow-up Telephone Call  TCM completed by pts daughter Danielle Benitez  Date discharged?02/16/2017   How have you been since you were released from the hospital? "ok"   Do you understand why you were in the hospital? yes   Do you understand the discharge instructions? yes   Where were you discharged to? home   Items Reviewed:  Medications reviewed: yes  Allergies reviewed: yes  Dietary changes reviewed: yes  Referrals reviewed: yes   Functional Questionnaire:  Activities of Daily Living (ADLs):   She states they are independent in the following: in all areas. Daughter assists with getting in and out of shower.    Any transportation issues/concerns?: no, daughter brings to appts   Any patient concerns? no   Confirmed importance and date/time of follow-up visits scheduled yes Provider Appointment booked with Dr Para March 5/16  Confirmed with patient if condition begins to worsen call PCP or go to the ER.  Patient was given the office number and encouraged to call back with question or concerns.  : yes

## 2017-02-18 NOTE — Progress Notes (Signed)
Remote pacemaker transmission.   

## 2017-02-19 NOTE — Discharge Summary (Signed)
[facility] Discharge Summary   Patient: Danielle Benitez:[id number]   PCP: Joaquim Nam, MD DOB: [birth year]   Date of admission: 02/14/2017   Date of discharge: 02/16/2017    Discharge Diagnoses:  Active Problems:   DM, type 2, with renal complications    Dyslipidemia   Mild CAD - 20-30% RCA in 2013   CKD (chronic kidney disease), stage III   Essential hypertension   Atrial fibrillation - 07/11/15 by PPM interrogation, recurrent 10/19 after DCCV 10/14   Normochromic normocytic anemia   Dysphagia   Chronic systolic congestive heart failure, NYHA class 3 (HCC)   Cardiac pacemaker in situ   Chest pain   Admitted From: home Disposition:  Home with home health  Recommendations for Outpatient Follow-up:  1. Please follow up with PCP and cardiology as recommended   Follow-up Information    Clegg, Amy D, NP Follow up on 02/28/2017.   Specialty:  Cardiology Why:  at 2:00 for hospital follow up.  Contact information: [address] [place] [address] [phone]        Care, [facility] Follow up.   Why:  Physical Therapy  Contact information: [address] [place] [address] [phone]        Joaquim Nam, MD. Schedule an appointment as soon as possible for a visit in 1 week(s).   Specialty:  Family Medicine Contact information: [address] [place] [address] [phone]          Diet recommendation: cardiac diet  Activity: The patient is advised to gradually reintroduce usual activities.  Discharge Condition: good  Code Status: full code  History of present illness: As per the H and P dictated on admission, "KEYATTA Benitez is a 83 y.o. female with medical history significant of HTN, orthostatic hypotension, symptomatic bradycardia with Medtronic PPM, paroxysmal atrial fibrillation chronic systolic CHF, CKD stage III, and iron deficiency anemia, mild CAD, esophageal dysmotility, DM,    Presented with  substernal chest pain that radiates up to her neck Started at 8:30 AM in the center of her chest constant pressure like  it has gradually improved. EMS was called and gave her aspirin. This was brought on by eating rice and having a choking episode. She has vomited twice and had had some chest tightness described as a knot in her chest thereafter. She was told in the past she has narrowing of hers esophagitis has trouble with dysphagia and esophageal dysmotility no associated fevers or chills cough or shortness of breath. Family reports she got clammy and cold.  She has trouble chewing her food due to poorly fitted dentures.  No current chest pain"  Hospital Course:  Summary of her active problems in the hospital is as following.  Esophageal/chest pain dysphagia due to esophageal dysmotility and presbyesophagus  -long standing h/o Dysphagia to solids and liquids, and meds -EGD in 2016 tortuous esophagus s/o dysmotility, esophagram 2016 unremarkable - GI consulted, esophagogram suggest dysmotility but no impaction. Recommends dietary changes and outpatient follow up -SLP evaluation unremarkable  -continue PPI    DM, type 2, with renal complications  continue home regimen    Mild CAD - 20-30% RCA in 2013 -stable, non cardiac chest pain, improved, troponin negative -continue Toprol, statin Cardiology consulted, no ischemic work up needed.     CKD (chronic kidney disease), stage III -stable, better than recent baseline, monitor    Essential hypertension -stable    Atrial fibrillation - 07/11/15  by PPM interrogation, recurrent 10/19 after DCCV 10/14 -continue amiodarone, TOprol -hold eliquis for possible EGD    Normochromic normocytic anemia -stable    Chronic systolic congestive heart failure, NYHA class 3 (HCC) -appears euvolemic, continue PO lasix, toprol  All other chronic medical condition were stable during the hospitalization.  Patient was seen by physical therapy,  who recommended home health, which was arranged by Child psychotherapist and case Production designer, theatre/television/film. On the day of the discharge the patient's vitals were stable, and no other acute medical condition were reported by patient. the patient was felt safe to be discharge at home with home health.  Procedures and Results:  none   Consultations:  Cardiology   Gastroenterology   DISCHARGE MEDICATION: Discharge Medication List as of 02/16/2017  5:10 PM    START taking these medications   Details  ferrous sulfate 325 (65 FE) MG tablet Take 1 tablet (325 mg total) by mouth 2 (two) times daily with a meal., Starting Wed 02/16/2017, Until Thu 02/16/2018, Normal    Multiple Vitamin (MULTIVITAMIN) capsule Take 1 capsule by mouth daily., Starting Wed 02/16/2017, Normal      CONTINUE these medications which have NOT CHANGED   Details  acetaminophen (TYLENOL) 325 MG tablet Take 1-2 tablets (325-650 mg total) by mouth 3 (three) times daily as needed., Starting Mon 04/05/2016, OTC    albuterol (PROVENTIL HFA;VENTOLIN HFA) 108 (90 Base) MCG/ACT inhaler Inhale 2 puffs into the lungs every 4 (four) hours as needed for wheezing or shortness of breath (cough, shortness of breath or wheezing.)., Starting Fri 10/15/2016, Normal    allopurinol (ZYLOPRIM) 300 MG tablet TAKE 1 TABLET BY MOUTH DAILY, Normal    amiodarone (PACERONE) 200 MG tablet TAKE 1 TABLET BY MOUTH DAILY, Normal    atorvastatin (LIPITOR) 40 MG tablet TAKE ONE-HALF (0.5) TABLET BY MOUTH ONCEDAILY AT 6PM, Normal    DULoxetine (CYMBALTA) 20 MG capsule TAKE ONE CAPSULE BY MOUTH EVERY NIGHT AT BEDTIME, Normal    ELIQUIS 2.5 MG TABS tablet TAKE 1 TABLET BY MOUTH TWICE A DAY, Normal    fluticasone (FLONASE) 50 MCG/ACT nasal spray Place 1 spray into both nostrils daily as needed for allergies or rhinitis., Historical Med    furosemide (LASIX) 40 MG tablet Take 1 tablet (40 mg total) by mouth 2 (two) times daily., Starting Mon 10/25/2016, Normal    GAS RELIEF 80 MG  chewable tablet CHEW ONE TABLET BY MOUTH EVERY 6 HOURS AS NEEDED FOR GAS., Normal    glimepiride (AMARYL) 1 MG tablet TAKE 1 TABLET BY MOUTH DAILY WITH BREAKFAST, Normal    Insulin Glargine (LANTUS SOLOSTAR) 100 UNIT/ML Solostar Pen Inject 5 Units into the skin daily at 10 pm., Starting Thu 01/27/2017, Normal    Insulin Pen Needle (PEN NEEDLES) 30G X 5 MM MISC Inject 1 Dose as directed daily. Use with insulin pen, Starting Thu 01/27/2017, Normal    isosorbide mononitrate (IMDUR) 30 MG 24 hr tablet Take 1 tablet (30 mg total) by mouth daily., Starting Wed 09/08/2016, Normal    loratadine (CLARITIN CHILDRENS) 5 MG chewable tablet Chew 1 tablet (5 mg total) by mouth daily., Starting Wed 01/26/2017, No Print    metoprolol succinate (TOPROL-XL) 50 MG 24 hr tablet TAKE 1 TABLET BY MOUTH TWICE A DAY, Normal    Multiple Vitamins-Minerals (ONE-A-DAY 50 PLUS PO) Take 1 capsule by mouth daily., Historical Med    ondansetron (ZOFRAN ODT) 4 MG disintegrating tablet Take 1 tablet (4 mg total) by mouth every 8 (eight) hours  as needed for nausea or vomiting., Starting Tue 11/04/2015, Normal    ONE TOUCH ULTRA TEST test strip USE TO CHECK BLOOD SUGAR ONCE A DAY AS DIRECTED, Normal    ONGLYZA 2.5 MG TABS tablet TAKE 1 TABLET BY MOUTH DAILY, Normal    pantoprazole (PROTONIX) 40 MG tablet TAKE 1 TABLET BY MOUTH DAILY, Normal    senna-docusate (SENOKOT-S) 8.6-50 MG tablet Take 1 tablet by mouth daily as needed for mild constipation., Starting Mon 07/28/2015, OTC    traZODone (DESYREL) 50 MG tablet Take 0.5-1 tablets (25-50 mg total) by mouth at bedtime as needed for sleep. Don't take >50mg  in a day., Starting Wed 01/21/2016, Normal       Allergies  Allergen Reactions  . Beta Adrenergic Blockers Other (See Comments)    Couldn't speak or hear Metoprolol seems ok  . Jardiance [Empagliflozin] Other (See Comments)    Rash, kidney issues   Discharge Instructions    DIET DYS 3    Complete by:  As directed     Fluid consistency:  Thin   Discharge instructions    Complete by:  As directed    It is important that you read following instructions as well as go over your medication list with RN to help you understand your care after this hospitalization.  Discharge Instructions: Please follow-up with PCP in one week  Please request your primary care physician to go over all Hospital Tests and Procedure/Radiological results at the follow up,  Please get all Hospital records sent to your PCP by signing hospital release before you go home.   You were cared for by a hospitalist during your hospital stay. If you have any questions about your discharge medications or the care you received while you were in the hospital after you are discharged, you can call the unit and ask to speak with the hospitalist on call if the hospitalist that took care of you is not available.  Once you are discharged, your primary care physician will handle any further medical issues. Please note that NO REFILLS for any discharge medications will be authorized once you are discharged, as it is imperative that you return to your primary care physician (or establish a relationship with a primary care physician if you do not have one) for your aftercare needs so that they can reassess your need for medications and monitor your lab values. You Must read complete instructions/literature along with all the possible adverse reactions/side effects for all the Medicines you take and that have been prescribed to you. Take any new Medicines after you have completely understood and accept all the possible adverse reactions/side effects. Wear Seat belts while driving.   Increase activity slowly    Complete by:  As directed      Discharge Exam: Filed Weights   02/14/17 2220 02/15/17 0511 02/16/17 0401  Weight: 86.9 kg (191 lb 9.6 oz) 89.6 kg (197 lb 8 oz) 85.6 kg (188 lb 12.8 oz)   Vitals:   02/16/17 0401 02/16/17 1446  BP: (!) 143/70 132/69    Pulse: 70 71  Resp: 16 18  Temp: 97.6 F (36.4 C) 97.5 F (36.4 C)   General: Appear in no distress, no Rash; Oral Mucosa moist. Cardiovascular: S1 and S2 Present, no Murmur, no JVD Respiratory: Bilateral Air entry present and Clear to Auscultation, no Crackles, no wheezes Abdomen: Bowel Sound present, Soft and no tenderness Extremities: no Pedal edema, no calf tenderness Neurology: Grossly no focal neuro deficit.  The results  of significant diagnostics from this hospitalization (including imaging, microbiology, ancillary and laboratory) are listed below for reference.    Significant Diagnostic Studies: Dg Chest 2 View  Result Date: 02/14/2017 CLINICAL DATA:  Chest pain EXAM: CHEST  2 VIEW COMPARISON:  June 13, 2016 FINDINGS: There is no edema or consolidation. Heart is borderline enlarged with pulmonary vascularity within normal limits. Pacemaker leads are attached to the right atrium and middle cardiac vein. No pneumothorax. No adenopathy. There is arthropathy in each shoulder as well as in the thoracic spine. IMPRESSION: No edema or consolidation.  Stable cardiac silhouette. Electronically Signed   By: Bretta Bang III M.D.   On: 02/14/2017 12:52   Dg Ankle Complete Left  Result Date: 02/02/2017 CLINICAL DATA:  Fall 02/01/2017.  Left foot injury. EXAM: LEFT ANKLE COMPLETE - 3+ VIEW COMPARISON:  None. FINDINGS: Soft swelling is worse over the lateral malleolus. The ankle is located. No acute osseous abnormality is present. IMPRESSION: 1. Bimalleolar soft tissue swelling is worse over the lateral malleolus. Ligamentous injury is not excluded. 2. No acute osseous abnormality. Electronically Signed   By: Marin Roberts M.D.   On: 02/02/2017 16:38   Dg Esophagus  Result Date: 02/15/2017 CLINICAL DATA:  Difficulty swallowing. EXAM: ESOPHOGRAM/BARIUM SWALLOW TECHNIQUE: Single contrast examination was performed using  thin barium. FLUOROSCOPY TIME:  Fluoroscopy Time:  2 minutes  and 54 seconds. Radiation Exposure Index (if provided by the fluoroscopic device): 21.3 mGy Number of Acquired Spot Images: COMPARISON:  08/05/2015. FINDINGS: Study was performed as a single contrast exam given patient medical condition and in mobility. Oblique views of the hypopharynx while swallowing are unremarkable. Assessment of the esophagus demonstrates prominent tertiary contractions in the distal half of the esophagus with evidence of presbyesophagus. There is a small hiatal hernia with apparent restricted flow of barium through the diaphragmatic hiatus into the infra diaphragmatic portions of the stomach. No gross mass lesion can be identified. There is no evidence for esophageal diverticulum. Multiple episodes of gastroesophageal reflux were observed. A 13 mm barium tablet did pass into the stomach when taken with water. IMPRESSION: 1. Nonspecific esophageal motility disorder with prominent tertiary contractions and presbyesophagus. 2. Small hiatal hernia with some apparent restriction of flow into the infra diaphragmatic stomach although this may in part be positional. 3. Gastroesophageal reflux. Electronically Signed   By: Kennith Center M.D.   On: 02/15/2017 15:23   Dg Foot Complete Left  Result Date: 02/02/2017 CLINICAL DATA:  Fall 02/01/2017.  Left foot injury. EXAM: LEFT FOOT - COMPLETE 3+ VIEW COMPARISON:  None. FINDINGS: Mild generalized osteopenia is present. Diffuse soft tissue swelling is noted. No acute or focal osseous abnormality is present. IMPRESSION: 1. No acute or focal osseous abnormality. 2. Is mild diffuse soft swelling may reflect extremity edema or cellulitis. Soft tissue injury is considered less likely. Electronically Signed   By: Marin Roberts M.D.   On: 02/02/2017 16:36    Microbiology: No results found for this or any previous visit (from the past 240 hour(s)).   Labs: CBC:  Recent Labs Lab 02/14/17 1205 02/16/17 0540  WBC 7.7 7.3  HGB 10.6* 10.2*  HCT  34.8* 33.8*  MCV 87.4 86.2  PLT 152 125*   Basic Metabolic Panel:  Recent Labs Lab 02/14/17 1205 02/16/17 0540  NA 138 139  K 3.7 3.3*  CL 98* 101  CO2 31 28  GLUCOSE 219* 90  BUN 23* 20  CREATININE 1.65* 1.52*  CALCIUM 8.8* 8.8*   Liver  Function Tests: No results for input(s): AST, ALT, ALKPHOS, BILITOT, PROT, ALBUMIN in the last 168 hours. No results for input(s): LIPASE, AMYLASE in the last 168 hours. No results for input(s): AMMONIA in the last 168 hours. Cardiac Enzymes:  Recent Labs Lab 02/14/17 2306 02/15/17 0223 02/15/17 0712  TROPONINI <0.03 <0.03 <0.03   BNP (last 3 results)  Recent Labs  06/13/16 1155 07/01/16 1535 01/20/17 1155  BNP 137.2* 253.1* 197.7*   CBG:  Recent Labs Lab 02/16/17 0354 02/16/17 0413 02/16/17 0727 02/16/17 1100 02/16/17 1625  GLUCAP 64* 77 82 338* 227*   Time spent: 35 minutes  Signed:  Sharice Harriss  [facility] 02/16/2017 , 7:57 AM

## 2017-02-21 ENCOUNTER — Other Ambulatory Visit (HOSPITAL_COMMUNITY): Payer: Self-pay | Admitting: Pharmacist

## 2017-02-21 ENCOUNTER — Other Ambulatory Visit (HOSPITAL_COMMUNITY): Payer: Self-pay | Admitting: Cardiology

## 2017-02-21 ENCOUNTER — Telehealth: Payer: Self-pay

## 2017-02-21 DIAGNOSIS — F329 Major depressive disorder, single episode, unspecified: Secondary | ICD-10-CM

## 2017-02-21 DIAGNOSIS — Z9181 History of falling: Secondary | ICD-10-CM

## 2017-02-21 DIAGNOSIS — J449 Chronic obstructive pulmonary disease, unspecified: Secondary | ICD-10-CM | POA: Diagnosis not present

## 2017-02-21 DIAGNOSIS — N183 Chronic kidney disease, stage 3 (moderate): Secondary | ICD-10-CM | POA: Diagnosis not present

## 2017-02-21 DIAGNOSIS — E1122 Type 2 diabetes mellitus with diabetic chronic kidney disease: Secondary | ICD-10-CM | POA: Diagnosis not present

## 2017-02-21 DIAGNOSIS — S99922D Unspecified injury of left foot, subsequent encounter: Secondary | ICD-10-CM | POA: Diagnosis not present

## 2017-02-21 DIAGNOSIS — Z794 Long term (current) use of insulin: Secondary | ICD-10-CM

## 2017-02-21 DIAGNOSIS — M109 Gout, unspecified: Secondary | ICD-10-CM

## 2017-02-21 DIAGNOSIS — M25572 Pain in left ankle and joints of left foot: Secondary | ICD-10-CM | POA: Diagnosis not present

## 2017-02-21 DIAGNOSIS — I130 Hypertensive heart and chronic kidney disease with heart failure and stage 1 through stage 4 chronic kidney disease, or unspecified chronic kidney disease: Secondary | ICD-10-CM | POA: Diagnosis not present

## 2017-02-21 DIAGNOSIS — E669 Obesity, unspecified: Secondary | ICD-10-CM

## 2017-02-21 DIAGNOSIS — I4891 Unspecified atrial fibrillation: Secondary | ICD-10-CM | POA: Diagnosis not present

## 2017-02-21 DIAGNOSIS — I951 Orthostatic hypotension: Secondary | ICD-10-CM | POA: Diagnosis not present

## 2017-02-21 DIAGNOSIS — F419 Anxiety disorder, unspecified: Secondary | ICD-10-CM

## 2017-02-21 DIAGNOSIS — E1142 Type 2 diabetes mellitus with diabetic polyneuropathy: Secondary | ICD-10-CM | POA: Diagnosis not present

## 2017-02-21 DIAGNOSIS — R269 Unspecified abnormalities of gait and mobility: Secondary | ICD-10-CM | POA: Diagnosis not present

## 2017-02-21 DIAGNOSIS — I5022 Chronic systolic (congestive) heart failure: Secondary | ICD-10-CM | POA: Diagnosis not present

## 2017-02-21 DIAGNOSIS — E785 Hyperlipidemia, unspecified: Secondary | ICD-10-CM

## 2017-02-21 DIAGNOSIS — I2510 Atherosclerotic heart disease of native coronary artery without angina pectoris: Secondary | ICD-10-CM | POA: Diagnosis not present

## 2017-02-21 MED ORDER — METOPROLOL SUCCINATE ER 50 MG PO TB24: 50.0000 mg | ORAL_TABLET | Freq: Two times a day (BID) | ORAL | 3 refills | Status: DC

## 2017-02-21 NOTE — Telephone Encounter (Signed)
Danielle Benitez with [facility] left v/m requesting verbal order for home health PT 2 x a week for 6 weeks and OT evaluation.

## 2017-02-22 NOTE — Telephone Encounter (Signed)
Vernona Rieger from [facility] advised.

## 2017-02-22 NOTE — Telephone Encounter (Signed)
Please give the order.  Thanks.   

## 2017-02-23 ENCOUNTER — Encounter: Payer: Self-pay | Admitting: Family Medicine

## 2017-02-23 ENCOUNTER — Ambulatory Visit (INDEPENDENT_AMBULATORY_CARE_PROVIDER_SITE_OTHER)
Admission: RE | Admit: 2017-02-23 | Discharge: 2017-02-23 | Disposition: A | Discharge status: Home / Self Care | Payer: Medicare Other | Source: Ambulatory Visit | Attending: Family Medicine | Admitting: Family Medicine

## 2017-02-23 ENCOUNTER — Ambulatory Visit (INDEPENDENT_AMBULATORY_CARE_PROVIDER_SITE_OTHER): Payer: Medicare Other | Admitting: Family Medicine

## 2017-02-23 VITALS — BP 118/68 | HR 78 | Temp 97.9°F | Wt 191.8 lb

## 2017-02-23 DIAGNOSIS — N183 Chronic kidney disease, stage 3 (moderate): Secondary | ICD-10-CM | POA: Diagnosis not present

## 2017-02-23 DIAGNOSIS — I951 Orthostatic hypotension: Secondary | ICD-10-CM | POA: Diagnosis not present

## 2017-02-23 DIAGNOSIS — I2510 Atherosclerotic heart disease of native coronary artery without angina pectoris: Secondary | ICD-10-CM | POA: Diagnosis not present

## 2017-02-23 DIAGNOSIS — M25572 Pain in left ankle and joints of left foot: Secondary | ICD-10-CM

## 2017-02-23 DIAGNOSIS — E1129 Type 2 diabetes mellitus with other diabetic kidney complication: Secondary | ICD-10-CM | POA: Diagnosis not present

## 2017-02-23 DIAGNOSIS — Z7901 Long term (current) use of anticoagulants: Secondary | ICD-10-CM

## 2017-02-23 DIAGNOSIS — I4891 Unspecified atrial fibrillation: Secondary | ICD-10-CM | POA: Diagnosis not present

## 2017-02-23 DIAGNOSIS — Z9181 History of falling: Secondary | ICD-10-CM | POA: Diagnosis not present

## 2017-02-23 DIAGNOSIS — K224 Dyskinesia of esophagus: Secondary | ICD-10-CM

## 2017-02-23 DIAGNOSIS — I130 Hypertensive heart and chronic kidney disease with heart failure and stage 1 through stage 4 chronic kidney disease, or unspecified chronic kidney disease: Secondary | ICD-10-CM | POA: Diagnosis not present

## 2017-02-23 DIAGNOSIS — Z794 Long term (current) use of insulin: Secondary | ICD-10-CM | POA: Diagnosis not present

## 2017-02-23 DIAGNOSIS — E1142 Type 2 diabetes mellitus with diabetic polyneuropathy: Secondary | ICD-10-CM | POA: Diagnosis not present

## 2017-02-23 DIAGNOSIS — I480 Paroxysmal atrial fibrillation: Secondary | ICD-10-CM

## 2017-02-23 DIAGNOSIS — I5022 Chronic systolic (congestive) heart failure: Secondary | ICD-10-CM | POA: Diagnosis not present

## 2017-02-23 DIAGNOSIS — E785 Hyperlipidemia, unspecified: Secondary | ICD-10-CM | POA: Diagnosis not present

## 2017-02-23 DIAGNOSIS — M79672 Pain in left foot: Secondary | ICD-10-CM | POA: Diagnosis not present

## 2017-02-23 DIAGNOSIS — E1121 Type 2 diabetes mellitus with diabetic nephropathy: Secondary | ICD-10-CM

## 2017-02-23 DIAGNOSIS — R269 Unspecified abnormalities of gait and mobility: Secondary | ICD-10-CM | POA: Diagnosis not present

## 2017-02-23 DIAGNOSIS — E1122 Type 2 diabetes mellitus with diabetic chronic kidney disease: Secondary | ICD-10-CM | POA: Diagnosis not present

## 2017-02-23 DIAGNOSIS — E1165 Type 2 diabetes mellitus with hyperglycemia: Secondary | ICD-10-CM

## 2017-02-23 DIAGNOSIS — I10 Essential (primary) hypertension: Secondary | ICD-10-CM

## 2017-02-23 DIAGNOSIS — J449 Chronic obstructive pulmonary disease, unspecified: Secondary | ICD-10-CM | POA: Diagnosis not present

## 2017-02-23 DIAGNOSIS — S99922D Unspecified injury of left foot, subsequent encounter: Secondary | ICD-10-CM | POA: Diagnosis not present

## 2017-02-23 DIAGNOSIS — M109 Gout, unspecified: Secondary | ICD-10-CM | POA: Diagnosis not present

## 2017-02-23 NOTE — Patient Instructions (Signed)
Don't change your meds for now.   Keep working with PT as much as you can.  Small bites, chew well, and keep the head of your bed elevated.  Take care.  Glad to see you.

## 2017-02-23 NOTE — Progress Notes (Signed)
Patient: Danielle Benitez ZOX:[id number]   PCP: Joaquim Nam, MD DOB: 02-Apr-1932   Date of admission: 02/14/2017             Date of discharge: 02/16/2017    Discharge Diagnoses:  Active Problems:   DM, type 2, with renal complications    Dyslipidemia   Mild CAD - 20-30% RCA in 2013   CKD (chronic kidney disease), stage III   Essential hypertension   Atrial fibrillation - 07/11/15 by PPM interrogation, recurrent 10/19 after DCCV 10/14   Normochromic normocytic anemia   Dysphagia   Chronic systolic congestive heart failure, NYHA class 3 (HCC)   Cardiac pacemaker in situ   Chest pain   Admitted From: home Disposition:  Home with home health  Recommendations for Outpatient Follow-up:  1. Please follow up with PCP and cardiology as recommended      Follow-up Information    Clegg, Amy D, NP Follow up on 02/28/2017.   Specialty:  Cardiology Why:  at 2:00 for hospital follow up.  Contact information: [address] [place] [address] [phone]        Care, [facility] Follow up.   Why:  Physical Therapy  Contact information: [address] [place] [address] [phone]        Joaquim Nam, MD. Schedule an appointment as soon as possible for a visit in 1 week(s).   Specialty:  Family Medicine Contact information: [address] [place] [address] [phone]          Diet recommendation: cardiac diet  Activity: The patient is advised to gradually reintroduce usual activities.  Discharge Condition: good  Code Status: full code  History of present illness: As per the H and P dictated on admission, "Danielle Benitez a 83 y.o.femalewith medical history significant of HTN, orthostatic hypotension, symptomatic bradycardia with Medtronic PPM, paroxysmal atrial fibrillation chronic systolic CHF, CKD stage III, and iron deficiency anemia, mild CAD, esophageal dysmotility, DM,   Presented with substernal  chest pain that radiates up to her neck Started at 8:30 AM in the center of her chest constant pressure like it has gradually improved. EMS was called and gave her aspirin. This was brought on by eating rice and having a choking episode.She has vomited twice and had had some chest tightness described as a knot in her chest thereafter. She was told in the past she has narrowing of hers esophagitis has trouble with dysphagia and esophageal dysmotility no associated fevers or chills cough or shortness of breath. Family reports she got clammy and cold.  She has trouble chewing her food due to poorly fitted dentures.  No current chest pain"  Hospital Course:  Summary of her active problems in the hospital is as following. Esophageal/chest pain dysphagia due to esophageal dysmotility and presbyesophagus  -long standing h/o Dysphagia to solids and liquids, and meds -EGD in 2016 tortuous esophagus s/o dysmotility, esophagram 2016 unremarkable - GI consulted, esophagogram suggest dysmotility but no impaction. Recommends dietary changes and outpatient follow up -SLP evaluation unremarkable  -continue PPI  DM, type 2, with renal complications  continue home regimen  Mild CAD - 20-30% RCA in 2013 -stable, non cardiac chest pain, improved, troponin negative -continue Toprol, statin Cardiology consulted, no ischemic work up needed.   CKD (chronic kidney disease), stage III -stable, better than recent baseline, monitor  Essential hypertension -stable  Atrial fibrillation -07/11/15 by PPM interrogation, recurrent 10/19 after DCCV 10/14 -continue amiodarone, TOprol -  hold eliquis for possible EGD  Normochromic normocytic anemia -stable  Chronic systolic congestive heart failure,NYHA class 3 (HCC) -appears euvolemic, continue PO lasix, toprol  All other chronic medical condition were stable during the hospitalization.  Patient was seen by physical therapy, who  recommended home health, which was arranged by Child psychotherapist and case Production designer, theatre/television/film. On the day of the discharge the patient's vitals were stable, and no other acute medical condition were reported by patient. the patient was felt safe to be discharge at home with home health. =============================================== The above was discussed with patient. Previously admitted with a choking sensation, shortness of breath. Cardiac eval was noted for negative troponin. She was noted to have symptoms likely from esophageal dysmotility. Placed on dysphagia diet and routine aspiration cautions discussed with patient previously.  She was continued on baseline medications otherwise.  She is now back home. She is able to swallow. No vomiting. No similar choking episodes. She is chewing her food well. Eating small bites. The head of her bed is elevated. Pathophysiology of esophageal dysmotility discussed with patient.  Still with L ankle pain.  Still in air cast. L foot isn't as ttp, that is clearly better but her ankle still sore.   PT is coming to her house.    PMH and SH reviewed  ROS: Per HPI unless specifically indicated in ROS section   Meds, vitals, and allergies reviewed.   nad ncat Mmm OP wnl Neck supple, no LA rrr ctab abd soft not ttp Ext w/o edema L ankle still in air cast.  L lat mal still ttp but her foot isn't ttp o/w.

## 2017-02-24 ENCOUNTER — Encounter: Payer: Self-pay | Admitting: Family Medicine

## 2017-02-24 NOTE — Assessment & Plan Note (Signed)
Continue baseline regimen. 

## 2017-02-24 NOTE — Assessment & Plan Note (Signed)
Continue baseline regimen.

## 2017-02-24 NOTE — Assessment & Plan Note (Signed)
Supportive care discussed with patient. Dysphagia diet. Aspiration precautions. She will food well. Take small bites. All cautions discussed with patient and daughter and all understood. Continue baseline medications.

## 2017-02-24 NOTE — Assessment & Plan Note (Addendum)
The plan was to re-x-ray her ankle at the office visit. She was taken out of the Aircast for the exam in the exam room, put back in the Aircast for her to walk with her walker over to the x-ray room down the hall. This was done without incident. I escorted the patient to x-ray. Mills Koller was working in x-ray, per routine.  Terri and I were helping the patient get positioned so that she would be able to successfully and safely get on to the x-ray table. I was on the patient's right and Terri was on the patient's left.  We had been advising the patient to make one slow movement at a time so that she would be able to get onto the table. She would eventually need to take a step up onto a wide low bench that is equipped with grip tape for safety. Before we gave her further instructions, the patient moved away from both of [place] on her own. She moved diagonally quickly, putting one foot on the bench and then tried to jump up onto the table with one quick movement. She did this without instruction and without giving any warning that she was getting ready to do so. She was unable to bear weight fully on her left leg and she did not fall to the floor but she had to sit down on the bench. It did not look like she was specifically injured. We repositioned her on the bench and then help her stand and reiterated for her to follow the instructions to help her up on the table. She was able to step on the bench at that point. She was able to turn around and lay on the x-ray table. The x-ray was then completed without incident. There was no obvious injury. She was able to bear weight thereafter. We helped her off the x-ray table per routine with her following instructions. Family was notified. She was transported out of the office without further incident. See note on x-ray. Continue Aircast in the meantime.

## 2017-02-28 ENCOUNTER — Inpatient Hospital Stay (HOSPITAL_COMMUNITY): Admission: RE | Admit: 2017-02-28 | Payer: Medicare Other | Source: Ambulatory Visit

## 2017-02-28 ENCOUNTER — Inpatient Hospital Stay (HOSPITAL_COMMUNITY): Payer: Medicare Other

## 2017-03-02 DIAGNOSIS — Z9181 History of falling: Secondary | ICD-10-CM | POA: Diagnosis not present

## 2017-03-02 DIAGNOSIS — M25572 Pain in left ankle and joints of left foot: Secondary | ICD-10-CM | POA: Diagnosis not present

## 2017-03-02 DIAGNOSIS — S99922D Unspecified injury of left foot, subsequent encounter: Secondary | ICD-10-CM | POA: Diagnosis not present

## 2017-03-02 DIAGNOSIS — E1122 Type 2 diabetes mellitus with diabetic chronic kidney disease: Secondary | ICD-10-CM | POA: Diagnosis not present

## 2017-03-02 DIAGNOSIS — N183 Chronic kidney disease, stage 3 (moderate): Secondary | ICD-10-CM | POA: Diagnosis not present

## 2017-03-02 DIAGNOSIS — E1142 Type 2 diabetes mellitus with diabetic polyneuropathy: Secondary | ICD-10-CM | POA: Diagnosis not present

## 2017-03-02 DIAGNOSIS — I5022 Chronic systolic (congestive) heart failure: Secondary | ICD-10-CM | POA: Diagnosis not present

## 2017-03-02 DIAGNOSIS — M109 Gout, unspecified: Secondary | ICD-10-CM | POA: Diagnosis not present

## 2017-03-02 DIAGNOSIS — E785 Hyperlipidemia, unspecified: Secondary | ICD-10-CM | POA: Diagnosis not present

## 2017-03-02 DIAGNOSIS — I2510 Atherosclerotic heart disease of native coronary artery without angina pectoris: Secondary | ICD-10-CM | POA: Diagnosis not present

## 2017-03-02 DIAGNOSIS — R269 Unspecified abnormalities of gait and mobility: Secondary | ICD-10-CM | POA: Diagnosis not present

## 2017-03-02 DIAGNOSIS — I4891 Unspecified atrial fibrillation: Secondary | ICD-10-CM | POA: Diagnosis not present

## 2017-03-02 DIAGNOSIS — Z794 Long term (current) use of insulin: Secondary | ICD-10-CM | POA: Diagnosis not present

## 2017-03-02 DIAGNOSIS — J449 Chronic obstructive pulmonary disease, unspecified: Secondary | ICD-10-CM | POA: Diagnosis not present

## 2017-03-02 DIAGNOSIS — I951 Orthostatic hypotension: Secondary | ICD-10-CM | POA: Diagnosis not present

## 2017-03-02 DIAGNOSIS — I130 Hypertensive heart and chronic kidney disease with heart failure and stage 1 through stage 4 chronic kidney disease, or unspecified chronic kidney disease: Secondary | ICD-10-CM | POA: Diagnosis not present

## 2017-03-03 DIAGNOSIS — M109 Gout, unspecified: Secondary | ICD-10-CM | POA: Diagnosis not present

## 2017-03-03 DIAGNOSIS — J449 Chronic obstructive pulmonary disease, unspecified: Secondary | ICD-10-CM | POA: Diagnosis not present

## 2017-03-03 DIAGNOSIS — R269 Unspecified abnormalities of gait and mobility: Secondary | ICD-10-CM | POA: Diagnosis not present

## 2017-03-03 DIAGNOSIS — I5022 Chronic systolic (congestive) heart failure: Secondary | ICD-10-CM | POA: Diagnosis not present

## 2017-03-03 DIAGNOSIS — I951 Orthostatic hypotension: Secondary | ICD-10-CM | POA: Diagnosis not present

## 2017-03-03 DIAGNOSIS — Z9181 History of falling: Secondary | ICD-10-CM | POA: Diagnosis not present

## 2017-03-03 DIAGNOSIS — E1122 Type 2 diabetes mellitus with diabetic chronic kidney disease: Secondary | ICD-10-CM | POA: Diagnosis not present

## 2017-03-03 DIAGNOSIS — I4891 Unspecified atrial fibrillation: Secondary | ICD-10-CM | POA: Diagnosis not present

## 2017-03-03 DIAGNOSIS — M25572 Pain in left ankle and joints of left foot: Secondary | ICD-10-CM | POA: Diagnosis not present

## 2017-03-03 DIAGNOSIS — I2510 Atherosclerotic heart disease of native coronary artery without angina pectoris: Secondary | ICD-10-CM | POA: Diagnosis not present

## 2017-03-03 DIAGNOSIS — N183 Chronic kidney disease, stage 3 (moderate): Secondary | ICD-10-CM | POA: Diagnosis not present

## 2017-03-03 DIAGNOSIS — I130 Hypertensive heart and chronic kidney disease with heart failure and stage 1 through stage 4 chronic kidney disease, or unspecified chronic kidney disease: Secondary | ICD-10-CM | POA: Diagnosis not present

## 2017-03-03 DIAGNOSIS — E1142 Type 2 diabetes mellitus with diabetic polyneuropathy: Secondary | ICD-10-CM | POA: Diagnosis not present

## 2017-03-03 DIAGNOSIS — E785 Hyperlipidemia, unspecified: Secondary | ICD-10-CM | POA: Diagnosis not present

## 2017-03-03 DIAGNOSIS — S99922D Unspecified injury of left foot, subsequent encounter: Secondary | ICD-10-CM | POA: Diagnosis not present

## 2017-03-03 DIAGNOSIS — Z794 Long term (current) use of insulin: Secondary | ICD-10-CM | POA: Diagnosis not present

## 2017-03-04 DIAGNOSIS — S99922D Unspecified injury of left foot, subsequent encounter: Secondary | ICD-10-CM | POA: Diagnosis not present

## 2017-03-04 DIAGNOSIS — M25572 Pain in left ankle and joints of left foot: Secondary | ICD-10-CM | POA: Diagnosis not present

## 2017-03-04 DIAGNOSIS — I5022 Chronic systolic (congestive) heart failure: Secondary | ICD-10-CM | POA: Diagnosis not present

## 2017-03-04 DIAGNOSIS — I2510 Atherosclerotic heart disease of native coronary artery without angina pectoris: Secondary | ICD-10-CM | POA: Diagnosis not present

## 2017-03-04 DIAGNOSIS — N183 Chronic kidney disease, stage 3 (moderate): Secondary | ICD-10-CM | POA: Diagnosis not present

## 2017-03-04 DIAGNOSIS — M109 Gout, unspecified: Secondary | ICD-10-CM | POA: Diagnosis not present

## 2017-03-04 DIAGNOSIS — E1142 Type 2 diabetes mellitus with diabetic polyneuropathy: Secondary | ICD-10-CM | POA: Diagnosis not present

## 2017-03-04 DIAGNOSIS — I951 Orthostatic hypotension: Secondary | ICD-10-CM | POA: Diagnosis not present

## 2017-03-04 DIAGNOSIS — E785 Hyperlipidemia, unspecified: Secondary | ICD-10-CM | POA: Diagnosis not present

## 2017-03-04 DIAGNOSIS — I130 Hypertensive heart and chronic kidney disease with heart failure and stage 1 through stage 4 chronic kidney disease, or unspecified chronic kidney disease: Secondary | ICD-10-CM | POA: Diagnosis not present

## 2017-03-04 DIAGNOSIS — I4891 Unspecified atrial fibrillation: Secondary | ICD-10-CM | POA: Diagnosis not present

## 2017-03-04 DIAGNOSIS — Z794 Long term (current) use of insulin: Secondary | ICD-10-CM | POA: Diagnosis not present

## 2017-03-04 DIAGNOSIS — E1122 Type 2 diabetes mellitus with diabetic chronic kidney disease: Secondary | ICD-10-CM | POA: Diagnosis not present

## 2017-03-04 DIAGNOSIS — Z9181 History of falling: Secondary | ICD-10-CM | POA: Diagnosis not present

## 2017-03-04 DIAGNOSIS — R269 Unspecified abnormalities of gait and mobility: Secondary | ICD-10-CM | POA: Diagnosis not present

## 2017-03-04 DIAGNOSIS — J449 Chronic obstructive pulmonary disease, unspecified: Secondary | ICD-10-CM | POA: Diagnosis not present

## 2017-03-07 DIAGNOSIS — Z794 Long term (current) use of insulin: Secondary | ICD-10-CM | POA: Diagnosis not present

## 2017-03-07 DIAGNOSIS — I4891 Unspecified atrial fibrillation: Secondary | ICD-10-CM | POA: Diagnosis not present

## 2017-03-07 DIAGNOSIS — E1122 Type 2 diabetes mellitus with diabetic chronic kidney disease: Secondary | ICD-10-CM | POA: Diagnosis not present

## 2017-03-07 DIAGNOSIS — R269 Unspecified abnormalities of gait and mobility: Secondary | ICD-10-CM | POA: Diagnosis not present

## 2017-03-07 DIAGNOSIS — E1142 Type 2 diabetes mellitus with diabetic polyneuropathy: Secondary | ICD-10-CM | POA: Diagnosis not present

## 2017-03-07 DIAGNOSIS — I951 Orthostatic hypotension: Secondary | ICD-10-CM | POA: Diagnosis not present

## 2017-03-07 DIAGNOSIS — M109 Gout, unspecified: Secondary | ICD-10-CM | POA: Diagnosis not present

## 2017-03-07 DIAGNOSIS — E785 Hyperlipidemia, unspecified: Secondary | ICD-10-CM | POA: Diagnosis not present

## 2017-03-07 DIAGNOSIS — I2510 Atherosclerotic heart disease of native coronary artery without angina pectoris: Secondary | ICD-10-CM | POA: Diagnosis not present

## 2017-03-07 DIAGNOSIS — Z9181 History of falling: Secondary | ICD-10-CM | POA: Diagnosis not present

## 2017-03-07 DIAGNOSIS — I5022 Chronic systolic (congestive) heart failure: Secondary | ICD-10-CM | POA: Diagnosis not present

## 2017-03-07 DIAGNOSIS — S99922D Unspecified injury of left foot, subsequent encounter: Secondary | ICD-10-CM | POA: Diagnosis not present

## 2017-03-07 DIAGNOSIS — J449 Chronic obstructive pulmonary disease, unspecified: Secondary | ICD-10-CM | POA: Diagnosis not present

## 2017-03-07 DIAGNOSIS — N183 Chronic kidney disease, stage 3 (moderate): Secondary | ICD-10-CM | POA: Diagnosis not present

## 2017-03-07 DIAGNOSIS — I130 Hypertensive heart and chronic kidney disease with heart failure and stage 1 through stage 4 chronic kidney disease, or unspecified chronic kidney disease: Secondary | ICD-10-CM | POA: Diagnosis not present

## 2017-03-07 DIAGNOSIS — M25572 Pain in left ankle and joints of left foot: Secondary | ICD-10-CM | POA: Diagnosis not present

## 2017-03-09 ENCOUNTER — Other Ambulatory Visit: Payer: Self-pay | Admitting: Family Medicine

## 2017-03-09 DIAGNOSIS — I951 Orthostatic hypotension: Secondary | ICD-10-CM | POA: Diagnosis not present

## 2017-03-09 DIAGNOSIS — R269 Unspecified abnormalities of gait and mobility: Secondary | ICD-10-CM | POA: Diagnosis not present

## 2017-03-09 DIAGNOSIS — E1142 Type 2 diabetes mellitus with diabetic polyneuropathy: Secondary | ICD-10-CM | POA: Diagnosis not present

## 2017-03-09 DIAGNOSIS — Z794 Long term (current) use of insulin: Secondary | ICD-10-CM | POA: Diagnosis not present

## 2017-03-09 DIAGNOSIS — Z9181 History of falling: Secondary | ICD-10-CM | POA: Diagnosis not present

## 2017-03-09 DIAGNOSIS — N183 Chronic kidney disease, stage 3 (moderate): Secondary | ICD-10-CM | POA: Diagnosis not present

## 2017-03-09 DIAGNOSIS — E785 Hyperlipidemia, unspecified: Secondary | ICD-10-CM | POA: Diagnosis not present

## 2017-03-09 DIAGNOSIS — I2510 Atherosclerotic heart disease of native coronary artery without angina pectoris: Secondary | ICD-10-CM | POA: Diagnosis not present

## 2017-03-09 DIAGNOSIS — J449 Chronic obstructive pulmonary disease, unspecified: Secondary | ICD-10-CM | POA: Diagnosis not present

## 2017-03-09 DIAGNOSIS — I4891 Unspecified atrial fibrillation: Secondary | ICD-10-CM | POA: Diagnosis not present

## 2017-03-09 DIAGNOSIS — K219 Gastro-esophageal reflux disease without esophagitis: Secondary | ICD-10-CM

## 2017-03-09 DIAGNOSIS — I5022 Chronic systolic (congestive) heart failure: Secondary | ICD-10-CM | POA: Diagnosis not present

## 2017-03-09 DIAGNOSIS — I130 Hypertensive heart and chronic kidney disease with heart failure and stage 1 through stage 4 chronic kidney disease, or unspecified chronic kidney disease: Secondary | ICD-10-CM | POA: Diagnosis not present

## 2017-03-09 DIAGNOSIS — M109 Gout, unspecified: Secondary | ICD-10-CM | POA: Diagnosis not present

## 2017-03-09 DIAGNOSIS — E1122 Type 2 diabetes mellitus with diabetic chronic kidney disease: Secondary | ICD-10-CM | POA: Diagnosis not present

## 2017-03-09 DIAGNOSIS — S99922D Unspecified injury of left foot, subsequent encounter: Secondary | ICD-10-CM | POA: Diagnosis not present

## 2017-03-09 DIAGNOSIS — M25572 Pain in left ankle and joints of left foot: Secondary | ICD-10-CM | POA: Diagnosis not present

## 2017-03-10 ENCOUNTER — Other Ambulatory Visit (HOSPITAL_COMMUNITY): Payer: Self-pay | Admitting: Cardiology

## 2017-03-10 MED ORDER — ISOSORBIDE MONONITRATE ER 30 MG PO TB24: 30.0000 mg | ORAL_TABLET | Freq: Every day | ORAL | 6 refills | Status: DC

## 2017-03-11 DIAGNOSIS — M25572 Pain in left ankle and joints of left foot: Secondary | ICD-10-CM | POA: Diagnosis not present

## 2017-03-11 DIAGNOSIS — E1142 Type 2 diabetes mellitus with diabetic polyneuropathy: Secondary | ICD-10-CM | POA: Diagnosis not present

## 2017-03-11 DIAGNOSIS — Z9181 History of falling: Secondary | ICD-10-CM | POA: Diagnosis not present

## 2017-03-11 DIAGNOSIS — S99922D Unspecified injury of left foot, subsequent encounter: Secondary | ICD-10-CM | POA: Diagnosis not present

## 2017-03-11 DIAGNOSIS — I4891 Unspecified atrial fibrillation: Secondary | ICD-10-CM | POA: Diagnosis not present

## 2017-03-11 DIAGNOSIS — E785 Hyperlipidemia, unspecified: Secondary | ICD-10-CM | POA: Diagnosis not present

## 2017-03-11 DIAGNOSIS — I130 Hypertensive heart and chronic kidney disease with heart failure and stage 1 through stage 4 chronic kidney disease, or unspecified chronic kidney disease: Secondary | ICD-10-CM | POA: Diagnosis not present

## 2017-03-11 DIAGNOSIS — E1122 Type 2 diabetes mellitus with diabetic chronic kidney disease: Secondary | ICD-10-CM | POA: Diagnosis not present

## 2017-03-11 DIAGNOSIS — N183 Chronic kidney disease, stage 3 (moderate): Secondary | ICD-10-CM | POA: Diagnosis not present

## 2017-03-11 DIAGNOSIS — M109 Gout, unspecified: Secondary | ICD-10-CM | POA: Diagnosis not present

## 2017-03-11 DIAGNOSIS — I2510 Atherosclerotic heart disease of native coronary artery without angina pectoris: Secondary | ICD-10-CM | POA: Diagnosis not present

## 2017-03-11 DIAGNOSIS — I951 Orthostatic hypotension: Secondary | ICD-10-CM | POA: Diagnosis not present

## 2017-03-11 DIAGNOSIS — Z794 Long term (current) use of insulin: Secondary | ICD-10-CM | POA: Diagnosis not present

## 2017-03-11 DIAGNOSIS — J449 Chronic obstructive pulmonary disease, unspecified: Secondary | ICD-10-CM | POA: Diagnosis not present

## 2017-03-11 DIAGNOSIS — I5022 Chronic systolic (congestive) heart failure: Secondary | ICD-10-CM | POA: Diagnosis not present

## 2017-03-11 DIAGNOSIS — R269 Unspecified abnormalities of gait and mobility: Secondary | ICD-10-CM | POA: Diagnosis not present

## 2017-03-14 ENCOUNTER — Telehealth (HOSPITAL_COMMUNITY): Payer: Self-pay | Admitting: Cardiology

## 2017-03-14 DIAGNOSIS — I509 Heart failure, unspecified: Secondary | ICD-10-CM

## 2017-03-14 NOTE — Telephone Encounter (Signed)
WALK IN TRIAGE FORM Patients daughter arrived to CHF clinic as they thought an appointment was scheduled today however follow up is on 03/31/17  Patient does have a 5 lb weight gain in a week, weight normally 188-190, weight today 193.6 Face is very swollen, and c/o dizziness Denies SOB and chest pain  Currently taking lasix 40 BID  Please advise

## 2017-03-14 NOTE — Telephone Encounter (Signed)
Daughter [name] aware and voiced understanding Will have labs done at [facility] Follow up 6/8 @ 12 for add on appointment

## 2017-03-14 NOTE — Telephone Encounter (Signed)
Please have her increase her Lasix to 80mg  BID for 2 days. BMET and BNP today or tomorrow. Can we add her for Friday to be seen? Thank you .

## 2017-03-16 ENCOUNTER — Other Ambulatory Visit: Payer: Medicare Other

## 2017-03-16 ENCOUNTER — Telehealth: Payer: Self-pay | Admitting: *Deleted

## 2017-03-16 ENCOUNTER — Other Ambulatory Visit (INDEPENDENT_AMBULATORY_CARE_PROVIDER_SITE_OTHER): Payer: Medicare Other

## 2017-03-16 DIAGNOSIS — I2510 Atherosclerotic heart disease of native coronary artery without angina pectoris: Secondary | ICD-10-CM | POA: Diagnosis not present

## 2017-03-16 DIAGNOSIS — Z9181 History of falling: Secondary | ICD-10-CM | POA: Diagnosis not present

## 2017-03-16 DIAGNOSIS — I4891 Unspecified atrial fibrillation: Secondary | ICD-10-CM | POA: Diagnosis not present

## 2017-03-16 DIAGNOSIS — E1142 Type 2 diabetes mellitus with diabetic polyneuropathy: Secondary | ICD-10-CM | POA: Diagnosis not present

## 2017-03-16 DIAGNOSIS — E785 Hyperlipidemia, unspecified: Secondary | ICD-10-CM | POA: Diagnosis not present

## 2017-03-16 DIAGNOSIS — N183 Chronic kidney disease, stage 3 (moderate): Secondary | ICD-10-CM | POA: Diagnosis not present

## 2017-03-16 DIAGNOSIS — S99922D Unspecified injury of left foot, subsequent encounter: Secondary | ICD-10-CM | POA: Diagnosis not present

## 2017-03-16 DIAGNOSIS — I130 Hypertensive heart and chronic kidney disease with heart failure and stage 1 through stage 4 chronic kidney disease, or unspecified chronic kidney disease: Secondary | ICD-10-CM | POA: Diagnosis not present

## 2017-03-16 DIAGNOSIS — Z794 Long term (current) use of insulin: Secondary | ICD-10-CM | POA: Diagnosis not present

## 2017-03-16 DIAGNOSIS — R269 Unspecified abnormalities of gait and mobility: Secondary | ICD-10-CM | POA: Diagnosis not present

## 2017-03-16 DIAGNOSIS — E1122 Type 2 diabetes mellitus with diabetic chronic kidney disease: Secondary | ICD-10-CM | POA: Diagnosis not present

## 2017-03-16 DIAGNOSIS — I951 Orthostatic hypotension: Secondary | ICD-10-CM | POA: Diagnosis not present

## 2017-03-16 DIAGNOSIS — M25572 Pain in left ankle and joints of left foot: Secondary | ICD-10-CM | POA: Diagnosis not present

## 2017-03-16 DIAGNOSIS — I5022 Chronic systolic (congestive) heart failure: Secondary | ICD-10-CM | POA: Diagnosis not present

## 2017-03-16 DIAGNOSIS — I509 Heart failure, unspecified: Secondary | ICD-10-CM | POA: Diagnosis not present

## 2017-03-16 DIAGNOSIS — M109 Gout, unspecified: Secondary | ICD-10-CM | POA: Diagnosis not present

## 2017-03-16 DIAGNOSIS — J449 Chronic obstructive pulmonary disease, unspecified: Secondary | ICD-10-CM | POA: Diagnosis not present

## 2017-03-16 LAB — BASIC METABOLIC PANEL
BUN: 30 mg/dL — AB (ref 6–23)
CO2: 30 mEq/L (ref 19–32)
CREATININE: 1.81 mg/dL — AB (ref 0.40–1.20)
Calcium: 8.9 mg/dL (ref 8.4–10.5)
Chloride: 103 mEq/L (ref 96–112)
GFR: 28.36 mL/min — AB (ref >60.00)
GLUCOSE: 206 mg/dL — AB (ref 70–99)
POTASSIUM: 3.7 meq/L (ref 3.5–5.1)
Sodium: 141 mEq/L (ref 135–145)

## 2017-03-16 LAB — BRAIN NATRIURETIC PEPTIDE: Pro B Natriuretic peptide (BNP): 287 pg/mL — ABNORMAL HIGH (ref 0.0–100.0)

## 2017-03-16 NOTE — Telephone Encounter (Signed)
Jacki Cones with [facility] called requesting an order to extend physical therapy for two times a week for 4 weeks. Please call back with order at [phone]. Can leave detailed message on voicemail if she does not answer the phone.

## 2017-03-16 NOTE — Addendum Note (Signed)
Addended by: Alvina Chou on: 03/16/2017 02:42 PM   Modules accepted: Orders

## 2017-03-17 ENCOUNTER — Other Ambulatory Visit: Payer: Self-pay

## 2017-03-17 ENCOUNTER — Encounter (HOSPITAL_COMMUNITY): Payer: Self-pay

## 2017-03-17 ENCOUNTER — Telehealth: Payer: Self-pay

## 2017-03-17 ENCOUNTER — Emergency Department (HOSPITAL_COMMUNITY)
Admission: EM | Admit: 2017-03-17 | Discharge: 2017-03-17 | Disposition: A | Discharge status: Home / Self Care | Payer: Medicare Other | Attending: Physician Assistant | Admitting: Physician Assistant

## 2017-03-17 ENCOUNTER — Emergency Department (HOSPITAL_COMMUNITY): Payer: Medicare Other

## 2017-03-17 DIAGNOSIS — E1169 Type 2 diabetes mellitus with other specified complication: Secondary | ICD-10-CM | POA: Diagnosis not present

## 2017-03-17 DIAGNOSIS — Z950 Presence of cardiac pacemaker: Secondary | ICD-10-CM | POA: Diagnosis not present

## 2017-03-17 DIAGNOSIS — Z79899 Other long term (current) drug therapy: Secondary | ICD-10-CM | POA: Diagnosis not present

## 2017-03-17 DIAGNOSIS — R2243 Localized swelling, mass and lump, lower limb, bilateral: Secondary | ICD-10-CM | POA: Diagnosis present

## 2017-03-17 DIAGNOSIS — Z7901 Long term (current) use of anticoagulants: Secondary | ICD-10-CM | POA: Insufficient documentation

## 2017-03-17 DIAGNOSIS — R0609 Other forms of dyspnea: Secondary | ICD-10-CM | POA: Insufficient documentation

## 2017-03-17 DIAGNOSIS — I509 Heart failure, unspecified: Secondary | ICD-10-CM | POA: Diagnosis not present

## 2017-03-17 DIAGNOSIS — N184 Chronic kidney disease, stage 4 (severe): Secondary | ICD-10-CM | POA: Diagnosis not present

## 2017-03-17 DIAGNOSIS — Z794 Long term (current) use of insulin: Secondary | ICD-10-CM | POA: Insufficient documentation

## 2017-03-17 DIAGNOSIS — I5023 Acute on chronic systolic (congestive) heart failure: Secondary | ICD-10-CM | POA: Insufficient documentation

## 2017-03-17 DIAGNOSIS — I130 Hypertensive heart and chronic kidney disease with heart failure and stage 1 through stage 4 chronic kidney disease, or unspecified chronic kidney disease: Secondary | ICD-10-CM | POA: Diagnosis not present

## 2017-03-17 LAB — BASIC METABOLIC PANEL
Anion gap: 10 (ref 5–15)
BUN: 29 mg/dL — AB (ref 6–20)
CHLORIDE: 99 mmol/L — AB (ref 101–111)
CO2: 27 mmol/L (ref 22–32)
CREATININE: 1.82 mg/dL — AB (ref 0.44–1.00)
Calcium: 8.5 mg/dL — ABNORMAL LOW (ref 8.9–10.3)
GFR calc Af Amer: 28 mL/min — ABNORMAL LOW (ref >60)
GFR calc non Af Amer: 25 mL/min — ABNORMAL LOW (ref >60)
Glucose, Bld: 242 mg/dL — ABNORMAL HIGH (ref 65–99)
Potassium: 3.9 mmol/L (ref 3.5–5.1)
Sodium: 136 mmol/L (ref 135–145)

## 2017-03-17 LAB — CBC
HCT: 34.2 % — ABNORMAL LOW (ref 36.0–46.0)
Hemoglobin: 10.5 g/dL — ABNORMAL LOW (ref 12.0–15.0)
MCH: 27.4 pg (ref 26.0–34.0)
MCHC: 30.7 g/dL (ref 30.0–36.0)
MCV: 89.3 fL (ref 78.0–100.0)
PLATELETS: 100 10*3/uL — AB (ref 150–400)
RBC: 3.83 MIL/uL — ABNORMAL LOW (ref 3.87–5.11)
RDW: 19.2 % — AB (ref 11.5–15.5)
WBC: 9.1 10*3/uL (ref 4.0–10.5)

## 2017-03-17 LAB — BRAIN NATRIURETIC PEPTIDE: B Natriuretic Peptide: 365.9 pg/mL — ABNORMAL HIGH (ref 0.0–100.0)

## 2017-03-17 LAB — I-STAT TROPONIN, ED: Troponin i, poc: 0.01 ng/mL (ref 0.00–0.08)

## 2017-03-17 LAB — T4, FREE: Free T4: 1.59 ng/dL — ABNORMAL HIGH (ref 0.61–1.12)

## 2017-03-17 LAB — TSH: TSH: 1.695 u[IU]/mL (ref 0.350–4.500)

## 2017-03-17 MED ORDER — FUROSEMIDE 10 MG/ML IJ SOLN
40.0000 mg | Freq: Once | INTRAMUSCULAR | Status: AC
  Administered 2017-03-17: 40 mg via INTRAVENOUS
  Filled 2017-03-17: qty 4

## 2017-03-17 NOTE — ED Notes (Signed)
Patient left at this time with all belongings. 

## 2017-03-17 NOTE — Telephone Encounter (Signed)
[name] (daughter) says patient easily awakens when spoken to but then goes right back to sleep.  Daughter is most concerned about the lack of urination, awaiting call from Cards but will take the patient to the ER if needed.

## 2017-03-17 NOTE — ED Triage Notes (Signed)
Per Pt, Pt is coming from home with complaints of leg swelling and facial swelling that started over the weekend with increased SOB on exertion. Pt has hx of CHF and increased her Lasix from 80 mg to 160 mg for two days with no weight loss noted. Pt was told by cardiology to come to the ED. Hx of CHF and CKC.

## 2017-03-17 NOTE — Telephone Encounter (Signed)
Patient's daughter [name] called back today reporting that patient has increased swelling all over including in her face. Also reporting she is harding voiding even with the increased lasix. Stated she only lost 1 lb but gained an additional 2.5 lbs overnight.  Patient is sleeping all throughout the day.   I advised daughter to take her to the emergency room because she could possible have kidney failure with all her symptoms.  [name] verbalized that she will take her to the emergency room.

## 2017-03-17 NOTE — ED Provider Notes (Signed)
[facility]-EMERGENCY DEPT Provider Note   CSN: [id number] Arrival date & time: 03/17/17  1210     History   Chief Complaint Chief Complaint  Patient presents with  . Leg Swelling    HPI Danielle Benitez is a 83 y.o. female.  Danielle Benitez is a [name] with history of CHF, diabetes, A. fib and hypertension who presents to the emergency department complaining of increasing leg swelling and facial swelling today. Patient also reports some increased dyspnea on exertion. She reports a history of her CHF and reports her cardiologist recently increased her dose to 60 mg for 2 days. She is back to her normal dose of Lasix. She tells me she feels like she is having more swelling to her legs as well as puffiness to her face. She reports chronic dyspnea on exertion that has worsened over the past several days. She reports she spoke to her cardiology team today who encouraged her to be evaluated for possible worsening kidney failure. She reports she's had 40 mg of Lasix today. She tells me she has follow-up with cardiology tomorrow. She denies fevers, coughing, syncope, chest pain, abdominal pain, nausea, vomiting, diarrhea, trouble urinating, leg pain, decreased urination or rashes.   The history is provided by the patient, medical records and a relative. No language interpreter was used.    Past Medical History:  Diagnosis Date  . Abnormality of gait   . Anemia, unspecified   . Anticoagulated- Eliquis 07/31/2015  . Anxiety state, unspecified   . Aortic stenosis    a. Mild by echo 08/2013.  Marland Kitchen Atrial fibrillation - developed 07/11/15 by PPM interrogation, recurrent after DCCV 10/14 07/11/2015    recurrent after DCCV 10/14  . Bifascicular block    afib  . Chronic systolic CHF (congestive heart failure) (HCC)    a. Dx 07/2015 - EF 35-40%, unclear etiology  . CKD (chronic kidney disease), stage III   . Depressive disorder, not elsewhere classified   . DM (diabetes mellitus), type 2, uncontrolled,  with renal complications (HCC) 01/08/2012  . Eczema 11/22/2013  . Esophageal dysmotility 08/05/2015  . Essential hypertension   . Female stress incontinence 05/06/2015  . Full dentures   . GERD (gastroesophageal reflux disease)   . Gout   . HOH (hard of hearing)   . Internal hemorrhoids without mention of complication   . Lumbago   . Memory loss   . Mild CAD    a. Cath 2013: 20-30% prox RCA.  Marland Kitchen Myocardial infarction (HCC) 07/2015  . Nonspecific abnormal results of liver function study   . Obesity, unspecified   . Orthostatic hypotension   . Osteoarthritis   . Other and unspecified hyperlipidemia   . Pain in joint, shoulder region 11/22/2013   left   . Personal history of fall   . Presence of permanent cardiac pacemaker   . Pruritus 11/22/2013  . S/P cardiac pacemaker procedure, PPM Medtronic REVO placed 01/11/2012 01/11/2012   a. for symptomatic bradycardia. Followed by Dr. Ladona Ridgel.  . Shortness of breath dyspnea    with movement-  . Wears glasses     Patient Active Problem List   Diagnosis Date Noted  . Ankle pain 02/24/2017  . Chest pain 02/14/2017  . Type 2 diabetes mellitus with diabetic neuropathy, unspecified (HCC) 01/26/2017  . Dysuria 09/21/2016  . Fall 06/25/2016  . Skin tear of elbow without complication 06/24/2016  . Injury of left shoulder and upper arm 06/24/2016  . Unequal blood pressure in  upper extremities 06/03/2016  . Cardiac pacemaker in situ 04/12/2016  . Cough 03/08/2016  . Abnormality of gait 03/05/2016  . Risk for falls 03/05/2016  . Insomnia 01/21/2016  . Chronic systolic congestive heart failure, NYHA class 3 (HCC) 01/20/2016  . LFT elevation 11/21/2015  . Weakness 11/06/2015  . Dysphagia   . Esophageal dysmotility 08/05/2015  . Anticoagulated- Eliquis 07/31/2015  . Normochromic normocytic anemia 07/31/2015  . Atrial fibrillation - 07/11/15 by PPM interrogation, recurrent 10/19 after DCCV 10/14 07/18/2015  . Moderate mitral regurgitation 07/18/2015   . Moderate tricuspid regurgitation 07/18/2015  . History of pacemaker - Medtronic 07/18/2015  . CKD (chronic kidney disease), stage III   . Orthostatic hypotension   . Essential hypertension   . Pericardial effusion 07/15/2015  . Female stress incontinence 05/06/2015  . Knee pain 12/18/2014  . Low back pain 02/27/2014  . Obese 02/27/2014  . Depression 02/27/2014  . Pain in joint, shoulder region 11/22/2013  . Eczema 11/22/2013  . S/P cardiac pacemaker procedure, PPM Medtronic REVO placed 01/11/2012 01/11/2012  . COPD bronchitis by CXR 01/10/2012  . Dyslipidemia 01/10/2012  . Mild CAD - 20-30% RCA in 2013 01/10/2012  . Fatigue 01/10/2012  . Sinus bradycardia, HR as low As 35 01/10/2012  . Mild aortic stenosis 01/10/2012  . Chest wall pain 01/08/2012  . SOB (shortness of breath) 01/08/2012  . DM, type 2, with renal complications  01/08/2012    Past Surgical History:  Procedure Laterality Date  . CARDIAC CATHETERIZATION  12/03/2003   Dr Clarene Duke  . CARDIOVERSION N/A 07/25/2015   Procedure: CARDIOVERSION;  Surgeon: Pricilla Riffle, MD;  Location: [facility] ENDOSCOPY;  Service: Cardiovascular;  Laterality: N/A;  . CARDIOVERSION N/A 08/06/2015   Procedure: CARDIOVERSION;  Surgeon: Laurey Morale, MD;  Location: [facility] ENDOSCOPY;  Service: Cardiovascular;  Laterality: N/A;  . CATARACT EXTRACTION W/ INTRAOCULAR LENS  IMPLANT, BILATERAL Bilateral 03/2015-04/2015  . COLONOSCOPY     ?????  . EP IMPLANTABLE DEVICE N/A 04/15/2016   Procedure: Pacemaker Implant;  Surgeon: Marinus Maw, MD;  Location: [facility] INVASIVE CV LAB;  Service: Cardiovascular;  Laterality: N/A;  . ESOPHAGOGASTRODUODENOSCOPY N/A 08/08/2015   Procedure: ESOPHAGOGASTRODUODENOSCOPY (EGD);  Surgeon: Iva Boop, MD;  Location: [facility] ENDOSCOPY;  Service: Endoscopy;  Laterality: N/A;  . EXTREMITY CYST EXCISION     finger  . EYE SURGERY Bilateral    catartact  . INSERT / REPLACE / REMOVE PACEMAKER    . LEFT HEART CATHETERIZATION WITH CORONARY  ANGIOGRAM N/A 01/10/2012   Procedure: LEFT HEART CATHETERIZATION WITH CORONARY ANGIOGRAM;  Surgeon: Chrystie Nose, MD;  Location: [facility] CATH LAB;  Service: Cardiovascular;  Laterality: N/A;  . ORIF WRIST FRACTURE Right 04/09/2014   Procedure: OPEN REDUCTION INTERNAL FIXATION (ORIF) RIGHT DISPLACED  DISTAL RADIUS  FRACTURE;  Surgeon: Dominica Severin, MD;  Location: [facility];  Service: Orthopedics;  Laterality: Right;  . PACEMAKER LEAD REMOVAL  04/12/2016   TEMPORARY PACEMAKER INSERTION  . PACEMAKER LEAD REMOVAL  04/15/2016   Procedure: Pacemaker Lead Removal;  Surgeon: Marinus Maw, MD;  Location: [facility] INVASIVE CV LAB;  Service: Cardiovascular;;  . PACEMAKER LEAD REMOVAL N/A 04/12/2016   Procedure: PACEMAKER LEAD REMOVAL;  Surgeon: Marinus Maw, MD;  Location: [facility] OR;  Service: Cardiovascular;  Laterality: N/A;  . PERMANENT PACEMAKER INSERTION Left 01/11/2012   Procedure: PERMANENT PACEMAKER INSERTION;  Surgeon: Thurmon Fair, MD;  Location: [facility] CATH LAB;  Service: Cardiovascular;  Laterality: Left;  . TEE WITHOUT CARDIOVERSION N/A 07/25/2015  Procedure: TRANSESOPHAGEAL ECHOCARDIOGRAM (TEE);  Surgeon: Pricilla Riffle, MD;  Location: [facility] ENDOSCOPY;  Service: Cardiovascular;  Laterality: N/A;    OB History    No data available       Home Medications    Prior to Admission medications   Medication Sig Start Date End Date Taking? Authorizing Provider  acetaminophen (TYLENOL) 325 MG tablet Take 1-2 tablets (325-650 mg total) by mouth 3 (three) times daily as needed. 04/05/16   Joaquim Nam, MD  albuterol (PROVENTIL HFA;VENTOLIN HFA) 108 (90 Base) MCG/ACT inhaler Inhale 2 puffs into the lungs every 4 (four) hours as needed for wheezing or shortness of breath (cough, shortness of breath or wheezing.). 10/15/16   Emi Belfast, FNP  allopurinol (ZYLOPRIM) 300 MG tablet TAKE 1 TABLET BY MOUTH DAILY 11/24/16   Joaquim Nam, MD  amiodarone (PACERONE) 200 MG tablet TAKE 1 TABLET BY MOUTH  DAILY 02/14/17   Laurey Morale, MD  atorvastatin (LIPITOR) 40 MG tablet TAKE ONE-HALF (0.5) TABLET BY MOUTH ONCEDAILY AT 6PM 12/27/16   Joaquim Nam, MD  DULoxetine (CYMBALTA) 20 MG capsule TAKE ONE CAPSULE BY MOUTH EVERY NIGHT AT BEDTIME 01/06/17   Joaquim Nam, MD  ELIQUIS 2.5 MG TABS tablet TAKE 1 TABLET BY MOUTH TWICE A DAY 02/14/17   Bensimhon, Bevelyn Buckles, MD  ferrous sulfate 325 (65 FE) MG tablet Take 1 tablet (325 mg total) by mouth 2 (two) times daily with a meal. Patient not taking: Reported on 02/23/2017 02/16/17 02/16/18  Rolly Salter, MD  fluticasone (FLONASE) 50 MCG/ACT nasal spray Place 1 spray into both nostrils daily as needed for allergies or rhinitis.    [provider]  furosemide (LASIX) 40 MG tablet Take 1 tablet (40 mg total) by mouth 2 (two) times daily. 10/25/16   Laurey Morale, MD  GAS RELIEF 80 MG chewable tablet CHEW ONE TABLET BY MOUTH EVERY 6 HOURS AS NEEDED FOR GAS. 11/06/16   Joaquim Nam, MD  glimepiride (AMARYL) 1 MG tablet TAKE 1 TABLET BY MOUTH DAILY WITH BREAKFAST 10/08/16   Joaquim Nam, MD  Insulin Glargine (LANTUS SOLOSTAR) 100 UNIT/ML Solostar Pen Inject 5 Units into the skin daily at 10 pm. Patient taking differently: Inject 21 Units into the skin daily at 10 pm. If CBG is greater than 150 add 1 unit daily  If less than 100 subtract 1 unit from previous day If 100-150 then no change 01/27/17   Joaquim Nam, MD  Insulin Pen Needle (PEN NEEDLES) 30G X 5 MM MISC Inject 1 Dose as directed daily. Use with insulin pen 01/27/17   Joaquim Nam, MD  isosorbide mononitrate (IMDUR) 30 MG 24 hr tablet Take 1 tablet (30 mg total) by mouth daily. 03/10/17   Bensimhon, Bevelyn Buckles, MD  loratadine (CLARITIN CHILDRENS) 5 MG chewable tablet Chew 1 tablet (5 mg total) by mouth daily. 01/26/17   Joaquim Nam, MD  metoprolol succinate (TOPROL-XL) 50 MG 24 hr tablet Take 1 tablet (50 mg total) by mouth 2 (two) times daily. Take with or immediately following  a meal. 02/21/17   Clegg, Amy D, NP  Multiple Vitamin (MULTIVITAMIN) capsule Take 1 capsule by mouth daily. 02/16/17   Rolly Salter, MD  Multiple Vitamins-Minerals (ONE-A-DAY 50 PLUS PO) Take 1 capsule by mouth daily.    [provider]  ondansetron (ZOFRAN ODT) 4 MG disintegrating tablet Take 1 tablet (4 mg total) by mouth every 8 (eight) hours as needed  for nausea or vomiting. 11/04/15   Doreene Nest, NP  ONE TOUCH ULTRA TEST test strip USE TO CHECK BLOOD SUGAR ONCE A DAY AS DIRECTED 08/10/16   Joaquim Nam, MD  ONGLYZA 2.5 MG TABS tablet TAKE 1 TABLET BY MOUTH DAILY 02/02/17   Joaquim Nam, MD  pantoprazole (PROTONIX) 40 MG tablet TAKE 1 TABLET BY MOUTH DAILY 03/09/17   Joaquim Nam, MD  senna-docusate (SENOKOT-S) 8.6-50 MG tablet Take 1 tablet by mouth daily as needed for mild constipation. 07/28/15   Barrett, Joline Salt, PA-C  traZODone (DESYREL) 50 MG tablet Take 0.5-1 tablets (25-50 mg total) by mouth at bedtime as needed for sleep. Don't take >50mg  in a day. 01/21/16   Joaquim Nam, MD    Family History Family History  Problem Relation Age of Onset  . Breast cancer Mother   . Cancer Mother   . Diabetes Daughter   . Hypertension Daughter   . Cancer Brother     Social History Social History  Substance Use Topics  . Smoking status: Never Smoker  . Smokeless tobacco: Never Used  . Alcohol use No     Allergies   Beta adrenergic blockers and Jardiance [empagliflozin]   Review of Systems Review of Systems  Constitutional: Negative for chills and fever.  HENT: Negative for congestion and sore throat.   Eyes: Negative for visual disturbance.  Respiratory: Positive for shortness of breath (Dyspnea on exertion). Negative for cough and wheezing.   Cardiovascular: Positive for leg swelling. Negative for chest pain and palpitations.  Gastrointestinal: Negative for abdominal pain, diarrhea, nausea and vomiting.  Genitourinary: Negative for dysuria.    Musculoskeletal: Negative for back pain and neck pain.  Skin: Negative for rash.  Neurological: Negative for syncope, light-headedness and headaches.     Physical Exam Updated Vital Signs BP (!) 130/54   Pulse 70   Temp 98 F (36.7 C) (Oral)   Resp 18   Ht 5' (1.524 m)   SpO2 100%   Physical Exam  Constitutional: She appears well-developed and well-nourished. No distress.  Nontoxic-appearing.  HENT:  Head: Normocephalic and atraumatic.  Mouth/Throat: Oropharynx is clear and moist.  Eyes: Conjunctivae are normal. Pupils are equal, round, and reactive to light. Right eye exhibits no discharge. Left eye exhibits no discharge.  Neck: Normal range of motion. Neck supple. No JVD present. No tracheal deviation present.  Cardiovascular: Normal rate, regular rhythm, normal heart sounds and intact distal pulses.  Exam reveals no gallop and no friction rub.   No murmur heard. Bilateral dorsalis pedis and radial pulses are intact.  Pulmonary/Chest: Effort normal and breath sounds normal. No stridor. No respiratory distress. She has no wheezes. She has no rales.  Lungs are clear to ascultation bilaterally. Symmetric chest expansion bilaterally. No increased work of breathing. No rales or rhonchi.    Abdominal: Soft. There is no tenderness.  Musculoskeletal: Normal range of motion. She exhibits edema. She exhibits no tenderness.  Mild bilateral 1+ pedal edema without calf edema or tenderness.  Lymphadenopathy:    She has no cervical adenopathy.  Neurological: She is alert. She exhibits normal muscle tone. Coordination normal.  Skin: Skin is warm and dry. Capillary refill takes less than 2 seconds. No rash noted. She is not diaphoretic. No erythema. No pallor.  Psychiatric: She has a normal mood and affect. Her behavior is normal.  Nursing note and vitals reviewed.    ED Treatments / Results  Labs (all labs ordered are  listed, but only abnormal results are displayed) Labs Reviewed   BASIC METABOLIC PANEL - Abnormal; Notable for the following:       Result Value   Chloride 99 (*)    Glucose, Bld 242 (*)    BUN 29 (*)    Creatinine, Ser 1.82 (*)    Calcium 8.5 (*)    GFR calc non Af Amer 25 (*)    GFR calc Af Amer 28 (*)    All other components within normal limits  CBC - Abnormal; Notable for the following:    RBC 3.83 (*)    Hemoglobin 10.5 (*)    HCT 34.2 (*)    RDW 19.2 (*)    Platelets 100 (*)    All other components within normal limits  BRAIN NATRIURETIC PEPTIDE - Abnormal; Notable for the following:    B Natriuretic Peptide 365.9 (*)    All other components within normal limits  T4, FREE - Abnormal; Notable for the following:    Free T4 1.59 (*)    All other components within normal limits  TSH  I-STAT TROPOININ, ED    EKG  EKG Interpretation  Date/Time:  Thursday March 17 2017 18:31:19 EDT Ventricular Rate:  70 PR Interval:    QRS Duration: 164 QT Interval:  471 QTC Calculation: 509 R Axis:   -79 Text Interpretation:  Sinus rhythm Prolonged PR interval RBBB and LAFB Probable left ventricular hypertrophy No significant change since last tracing Confirmed by Bary Castilla ([id number]) on 03/17/2017 8:16:59 PM       Radiology Dg Chest 2 View  Result Date: 03/17/2017 CLINICAL DATA:  Heart failure EXAM: CHEST  2 VIEW COMPARISON:  02/14/2017 FINDINGS: Chronic cardiomegaly. Dual-chamber pacer leads from the right in unremarkable position. There is no edema, consolidation, effusion, or pneumothorax. Glenohumeral osteoarthritis and thoracic disc degeneration and spondylosis. IMPRESSION: No evidence of active disease.  Cardiomegaly without edema. Electronically Signed   By: Marnee Spring M.D.   On: 03/17/2017 14:48    Procedures Procedures (including critical care time)  Medications Ordered in ED Medications  furosemide (LASIX) injection 40 mg (40 mg Intravenous Given 03/17/17 1747)     Initial Impression / Assessment and Plan / ED Course  I  have reviewed the triage vital signs and the nursing notes.  Pertinent labs & imaging results that were available during my care of the patient were reviewed by me and considered in my medical decision making (see chart for details).    This is a [name] with history of CHF, diabetes, A. fib and hypertension who presents to the emergency department complaining of increasing leg swelling and facial swelling today. Patient also reports some increased dyspnea on exertion. She reports a history of her CHF and reports her cardiologist recently increased her dose to 60 mg for 2 days. She is back to her normal dose of Lasix. She tells me she feels like she is having more swelling to her legs as well as puffiness to her face. She reports chronic dyspnea on exertion that has worsened over the past several days. She reports she spoke to her cardiology team today who encouraged her to be evaluated for possible worsening kidney failure. She reports she's had 40 mg of Lasix today. She tells me she has follow-up with cardiology tomorrow. On exam patient is afebrile nontoxic-appearing. She has mild bilateral 1+ pedal edema. No calf edema or tenderness. Lungs clear to auscultation bilaterally. EKG shows no significant change from her last  tracing. No hypoxia or tachypnea. Troponin is not elevated. Patient without chest pain. The BNP is only mildly elevated at 365. BMP reveals a glucose of 242. Creatinine of 1.82 which is around her baseline. CBC is around her baseline. No leukocytosis. Platelet count is low at 100,000. It was 125,000 4 weeks ago.  Chest x-ray shows cardiomegaly without edema. Creatinine is around her baseline. No increases to her kidney function. Provided the patient with 40 mg of IV Lasix and patient urinated several times in the emergency department reports feeling better prior to discharge. We'll discharge at this time and have her take double her dose tomorrow and then follow-up with her  cardiologist tomorrow afternoon. Patient and family agree with plan. I advised the patient to follow-up with their primary care provider this week. I advised the patient to return to the emergency department with new or worsening symptoms or new concerns. The patient verbalized understanding and agreement with plan.   This patient was discussed with and evaluated by Dr. Corlis Leak who agrees with assessment and plan.   Final Clinical Impressions(s) / ED Diagnoses   Final diagnoses:  Acute on chronic systolic congestive heart failure (HCC)  Stage 4 chronic kidney disease [facility])    New Prescriptions New Prescriptions   No medications on file     Lorene Dy 03/17/17 2037    Abelino Derrick, MD 03/18/17 2351

## 2017-03-17 NOTE — Telephone Encounter (Signed)
Thanks

## 2017-03-17 NOTE — Telephone Encounter (Signed)
If she isn't appropriately responsive, then advise ER.  O/w have her talk with cards about lasix use.   Thanks.

## 2017-03-17 NOTE — Telephone Encounter (Signed)
Left detailed message on voicemail.  

## 2017-03-17 NOTE — Telephone Encounter (Signed)
Please give the order.  Thanks.   

## 2017-03-17 NOTE — ED Notes (Signed)
Per EDP, patient is able to eat but with low sodium.

## 2017-03-17 NOTE — Telephone Encounter (Signed)
Danielle Benitez pts daughter (DPR signed) said pt has been taking the extra furosemide as instructed from cardiologist; pt has only lost 1 lb. Face is very puffy; pt is not voiding as often and is not incontinent as she has been in the past; pt is sleeping a lot; hard for pt to wake up; pt sitting now eating ice with her eyes closed. [name] is concerned; pt has appt with cardiology on 03/18/17 but advised Danielle Benitez if concerned to call cardiology office today. Danielle Benitez voiced understanding. FYI to Dr Para March.

## 2017-03-18 ENCOUNTER — Telehealth: Payer: Self-pay

## 2017-03-18 ENCOUNTER — Ambulatory Visit (HOSPITAL_COMMUNITY)
Admission: RE | Admit: 2017-03-18 | Discharge: 2017-03-18 | Disposition: A | Discharge status: Home / Self Care | Payer: Medicare Other | Source: Ambulatory Visit | Attending: Cardiology | Admitting: Cardiology

## 2017-03-18 VITALS — BP 138/76 | HR 89 | Wt 193.5 lb

## 2017-03-18 DIAGNOSIS — D539 Nutritional anemia, unspecified: Secondary | ICD-10-CM | POA: Insufficient documentation

## 2017-03-18 DIAGNOSIS — I951 Orthostatic hypotension: Secondary | ICD-10-CM | POA: Diagnosis not present

## 2017-03-18 DIAGNOSIS — M109 Gout, unspecified: Secondary | ICD-10-CM | POA: Insufficient documentation

## 2017-03-18 DIAGNOSIS — R269 Unspecified abnormalities of gait and mobility: Secondary | ICD-10-CM | POA: Diagnosis not present

## 2017-03-18 DIAGNOSIS — I2510 Atherosclerotic heart disease of native coronary artery without angina pectoris: Secondary | ICD-10-CM | POA: Insufficient documentation

## 2017-03-18 DIAGNOSIS — I6529 Occlusion and stenosis of unspecified carotid artery: Secondary | ICD-10-CM | POA: Diagnosis not present

## 2017-03-18 DIAGNOSIS — I350 Nonrheumatic aortic (valve) stenosis: Secondary | ICD-10-CM | POA: Insufficient documentation

## 2017-03-18 DIAGNOSIS — J449 Chronic obstructive pulmonary disease, unspecified: Secondary | ICD-10-CM | POA: Diagnosis not present

## 2017-03-18 DIAGNOSIS — N183 Chronic kidney disease, stage 3 unspecified: Secondary | ICD-10-CM

## 2017-03-18 DIAGNOSIS — E785 Hyperlipidemia, unspecified: Secondary | ICD-10-CM | POA: Insufficient documentation

## 2017-03-18 DIAGNOSIS — I130 Hypertensive heart and chronic kidney disease with heart failure and stage 1 through stage 4 chronic kidney disease, or unspecified chronic kidney disease: Secondary | ICD-10-CM | POA: Insufficient documentation

## 2017-03-18 DIAGNOSIS — E1122 Type 2 diabetes mellitus with diabetic chronic kidney disease: Secondary | ICD-10-CM | POA: Insufficient documentation

## 2017-03-18 DIAGNOSIS — I4891 Unspecified atrial fibrillation: Secondary | ICD-10-CM | POA: Diagnosis not present

## 2017-03-18 DIAGNOSIS — R001 Bradycardia, unspecified: Secondary | ICD-10-CM | POA: Diagnosis not present

## 2017-03-18 DIAGNOSIS — Z7902 Long term (current) use of antithrombotics/antiplatelets: Secondary | ICD-10-CM | POA: Insufficient documentation

## 2017-03-18 DIAGNOSIS — Z9181 History of falling: Secondary | ICD-10-CM | POA: Diagnosis not present

## 2017-03-18 DIAGNOSIS — Z7901 Long term (current) use of anticoagulants: Secondary | ICD-10-CM | POA: Diagnosis not present

## 2017-03-18 DIAGNOSIS — E1140 Type 2 diabetes mellitus with diabetic neuropathy, unspecified: Secondary | ICD-10-CM | POA: Insufficient documentation

## 2017-03-18 DIAGNOSIS — S99922D Unspecified injury of left foot, subsequent encounter: Secondary | ICD-10-CM | POA: Diagnosis not present

## 2017-03-18 DIAGNOSIS — Z95810 Presence of automatic (implantable) cardiac defibrillator: Secondary | ICD-10-CM | POA: Insufficient documentation

## 2017-03-18 DIAGNOSIS — Z794 Long term (current) use of insulin: Secondary | ICD-10-CM | POA: Diagnosis not present

## 2017-03-18 DIAGNOSIS — R0602 Shortness of breath: Secondary | ICD-10-CM | POA: Diagnosis not present

## 2017-03-18 DIAGNOSIS — E1142 Type 2 diabetes mellitus with diabetic polyneuropathy: Secondary | ICD-10-CM | POA: Diagnosis not present

## 2017-03-18 DIAGNOSIS — I5032 Chronic diastolic (congestive) heart failure: Secondary | ICD-10-CM | POA: Diagnosis not present

## 2017-03-18 DIAGNOSIS — I480 Paroxysmal atrial fibrillation: Secondary | ICD-10-CM | POA: Diagnosis not present

## 2017-03-18 DIAGNOSIS — I5022 Chronic systolic (congestive) heart failure: Secondary | ICD-10-CM | POA: Diagnosis not present

## 2017-03-18 DIAGNOSIS — M25572 Pain in left ankle and joints of left foot: Secondary | ICD-10-CM | POA: Diagnosis not present

## 2017-03-18 MED ORDER — METOLAZONE 2.5 MG PO TABS: 2.5000 mg | ORAL_TABLET | ORAL | 3 refills | Status: DC

## 2017-03-18 MED ORDER — POTASSIUM CHLORIDE CRYS ER 20 MEQ PO TBCR: 20.0000 meq | EXTENDED_RELEASE_TABLET | ORAL | 3 refills | Status: DC

## 2017-03-18 NOTE — Telephone Encounter (Signed)
Thanks

## 2017-03-18 NOTE — Progress Notes (Signed)
Patient ID: Danielle Benitez, female   DOB: July 03, 1932, 83 y.o.   MRN: [id number] PCP: Dr. Para March HF Cardiology: Dr. Shirlee Latch EP: Dr Ladona Ridgel   83 yo with history of HTN, orthostatic hypotension, symptomatic bradycardia with Medtronic PPM, paroxysmal atrial fibrillation chronic systolic CHF, CKD stage III, and iron deficiency anemia presents for cardiology followup. Around the beginning of 9/16, she began to develop exertional dyspnea. She was seen in the cardiology office on 07/25/15. She was in atrial fibrillation with RVR and echo showed EF 35-40% (had been 50-55% in past). PPM interrogation showed that atrial fibrillation had begun 07/11/15, so exertional dyspnea had preceded this by about 4 weeks. She was admitted from 10/7-10/17. She was anemic with hemoglobin 8.7. She was heme negative but Fe deficient. EGD was not done. She had TEE-guided DCCV to NSR. She also had AKI with creatinine up to 3.41, but it trended back down. She was sent home in NSR. She was readmitted 10/19-10/23 with recurrent atrial fibrillation and acute on chronic systolic CHF. She was diuresed about 10 lbs. It was decided to rate control her rather than repeat cardioversion, and she was sent home. she was only at home for about a day and developed increased dyspnea so came back to the ER and was re-admitted.  She was diuresed with IV Lasix and started on amiodarone.  She underwent DCCV on 10/26 but atrial fibrillation recurred on 10/29.  More amiodarone was loaded, and she went back into NSR.   Lexiscan Cardiolite in 11/16 showed basal inferior/inferolatearl fixed defect consistent with infarction but no ischemia.    Repeat echo was done 1/17.  This showed EF 45% with normal RV size and mildly decreased systolic function.   She was admitted in 7/17 with suspected infection of her device.  She had her PPM extracted and got a temporary-permanent device with later Medtronic PPM replacement. Last echo in 8/17 showed EF  55-60% with mild LVH, normal RV size and systolic function.   Admitted 02/14/17-02/16/17 with chest pain caused after she had a choking episode. Esophagram showed esophageal dysmotility with hiatal hernia. She was seen by Dr. Shirlee Latch in the hospital and felt to be euvolemic. Discharge weight was 188 pounds.   She returns today for HF follow up. She went to the ED yesterday for SOB and lower extremity edema. She was given 40mg  IV lasix. BNP was 365. She was sent home from the ED. Feels better today, but still SOB with little activity. She is no longer SOB at rest. Denies chest pain and palpitations. Taking medications. She had a bologna sandwich with mayo for breakfast. Adds salt to most of her foods. Drinking more than 2L a day.   Labs (7/16): LDL 48, HDL 55 Labs (10/16): K 3.9, creatinine 2.08 Labs (11/16): K 4.9, creatinine 2.5, LFTs normal, TSH normal, HCT 43.6 Labs (12/16): K 5.2, creatinine 1.73, HCT 41.9, plts 123 Labs (3/17): LFTs normal Labs (7/17): creatinine 1.87 Labs (8/17): LDL 57, HDL 50 Labs (9/17): K 4.9, creatinine 1.74, BNP 253 Labs (11/17): TSH normal, AST 67, ALT 37 Labs (12/17): K 4.1, creatinine 1.9, BNP 140 Labs (11/08/2016): K 4.5  Labs 94/09/2017): K 4.4 Creatinine 1.85  PMH: 1. HTN but also with history of orthostatic hypotension. 2. CKD stage III 3. Aortic stenosis: Mild 4. Symptomatic bradycardia s/p Medtronic PPM. - PPM infection 7/17 with device replacement.  5. Hyperlipidemia 6. Atrial fibrillation: Paroxysmal. TEE-guided DCCV in 10/16. She had DCCV again latera in 10/16 and  was started on amiodarone.   - PFTs (11/16) with normal spirometry, severe diffusion defect c/w pulmonary vascular disease.  7. Type II diabetes with diabetic neuropathy.  8. Gout 9. Fe-deficiency anemia 10. Chronic diastolic CHF: Echo 11/14 with EF 50-55%. Echo (10/16) with EF 35-40%, moderate LV dilation, diffuse hypokinesis, mild AS, moderate MR, PASP 48 mmHg.  Lexiscan Cardiolite  (11/16) with EF 31%, basal inferior and inferolateral fixed defect possibly consistent with infarction, no ischemia; degree of LV hypokinesis was out of proportion to the perfusion defect.  Echo (1/17) with EF 45%, normal RV size with mildly decreased systolic function, aortic sclerosis without stenosis.  - Echo (8/17): EF 55-60%, mild LVH, normal RV size and systolic function.  - Echo 02/2017 EF 60-65%, grade 1 DD 11. Esophageal dysmotility 12. Carotid stenosis: Carotid dopplers (9/17) with 40-59% LICA stenosis.  13. ABIs (12/17) normal.   SH: Lives alone but daughter is nearby.  Nonsmoker. No ETOH.    FH: No premature CAD.   ROS: All systems reviewed and negative except as per HPI.    Current Outpatient Prescriptions  Medication Sig Dispense Refill  . acetaminophen (TYLENOL) 325 MG tablet Take 1-2 tablets (325-650 mg total) by mouth 3 (three) times daily as needed.    Marland Kitchen albuterol (PROVENTIL HFA;VENTOLIN HFA) 108 (90 Base) MCG/ACT inhaler Inhale 2 puffs into the lungs every 4 (four) hours as needed for wheezing or shortness of breath (cough, shortness of breath or wheezing.). 1 Inhaler 1  . allopurinol (ZYLOPRIM) 300 MG tablet TAKE 1 TABLET BY MOUTH DAILY 90 tablet 1  . amiodarone (PACERONE) 200 MG tablet TAKE 1 TABLET BY MOUTH DAILY 90 tablet 3  . atorvastatin (LIPITOR) 40 MG tablet TAKE ONE-HALF (0.5) TABLET BY MOUTH ONCEDAILY AT 6PM 90 tablet 1  . DULoxetine (CYMBALTA) 20 MG capsule TAKE ONE CAPSULE BY MOUTH EVERY NIGHT AT BEDTIME 30 capsule 2  . ELIQUIS 2.5 MG TABS tablet TAKE 1 TABLET BY MOUTH TWICE A DAY 180 tablet 3  . fluticasone (FLONASE) 50 MCG/ACT nasal spray Place 1 spray into both nostrils daily as needed for allergies or rhinitis.    . furosemide (LASIX) 40 MG tablet Take 1 tablet (40 mg total) by mouth 2 (two) times daily. 60 tablet 3  . GAS RELIEF 80 MG chewable tablet CHEW ONE TABLET BY MOUTH EVERY 6 HOURS AS NEEDED FOR GAS. 30 tablet 5  . glimepiride (AMARYL) 1 MG tablet  TAKE 1 TABLET BY MOUTH DAILY WITH BREAKFAST 90 tablet 1  . Insulin Glargine (LANTUS SOLOSTAR) 100 UNIT/ML Solostar Pen Inject 5 Units into the skin daily at 10 pm. (Patient taking differently: Inject 21 Units into the skin daily at 10 pm. If CBG is greater than 150 add 1 unit daily  If less than 100 subtract 1 unit from previous day If 100-150 then no change) 5 pen PRN  . Insulin Pen Needle (PEN NEEDLES) 30G X 5 MM MISC Inject 1 Dose as directed daily. Use with insulin pen 100 each 3  . isosorbide mononitrate (IMDUR) 30 MG 24 hr tablet Take 1 tablet (30 mg total) by mouth daily. 30 tablet 6  . loratadine (CLARITIN CHILDRENS) 5 MG chewable tablet Chew 1 tablet (5 mg total) by mouth daily.    . metoprolol succinate (TOPROL-XL) 50 MG 24 hr tablet Take 1 tablet (50 mg total) by mouth 2 (two) times daily. Take with or immediately following a meal. 180 tablet 3  . Multiple Vitamins-Minerals (ONE-A-DAY 50 PLUS PO) Take 1  capsule by mouth daily.    . ONE TOUCH ULTRA TEST test strip USE TO CHECK BLOOD SUGAR ONCE A DAY AS DIRECTED 100 each 3  . ONGLYZA 2.5 MG TABS tablet TAKE 1 TABLET BY MOUTH DAILY 90 tablet 1  . pantoprazole (PROTONIX) 40 MG tablet TAKE 1 TABLET BY MOUTH DAILY 30 tablet 5  . senna-docusate (SENOKOT-S) 8.6-50 MG tablet Take 1 tablet by mouth daily as needed for mild constipation.    . traZODone (DESYREL) 50 MG tablet Take 0.5-1 tablets (25-50 mg total) by mouth at bedtime as needed for sleep. Don't take >50mg  in a day. 30 tablet 3   No current facility-administered medications for this encounter.    BP 138/76   Pulse 89   Wt 193 lb 8 oz (87.8 kg)   SpO2 98%   BMI 37.79 kg/m  General:  Elderly female, NAD.  HEENT: Normal.  Neck: supple. 6-7 cm JVD. Carotids 2+ bilat; no bruits. No lymphadenopathy or thryomegaly appreciated. Cor: PMI nondisplaced. Regular rate and rhythm. No rubs, gallops or murmurs. Lungs: Clear in upper lobes, crackles in bilateral bases. Normal effort.  Abdomen:  obese, soft, nontender, nondistended. No hepatosplenomegaly. No bruits or masses. + bowel sounds Extremities: no cyanosis, clubbing, rash. 2+ pedal edema.  Neuro: alert & orientedx3, cranial nerves grossly intact. moves all 4 extremities w/o difficulty. Affect pleasant  Assessment/Plan: 1. Chronic diastolic CHF: EF 52-77% by echo 10/16. Etiology uncertain: could be tachy-mediated cardiomyopathy, but symptoms seemed to predate the onset of atrial fibrillation. Possible viral myocarditis. Cannot rule out ischemic cardiomyopathy, but no definite anginal symptoms. Cardiolite in 11/16 showed a small area possibly of prior infarction but EF and wall motion abnormalities were out of proportion to the perfusion defect. By 8/17, EF was improved to 55-60%.   - NYHA III - Volume status remains elevated - Give one does of metolazone 2.5 today.  - Increase Lasix to 80mg  BID for the next 2 days.  - K was 3.9 yesterday. Will give her once a day for the next 2 days.  - Not on spiro with previous hyperkalemia.  - Continue Toprol XL 50mg  BID.  - Has appt. In 2 weeks with Shirlee Latch.  2. Atrial fibrillation: NSR today. Continue current dose of bb. No bleeding problems.  -Continue Amiodarone - Continue Eliquis - Denies hematochezia and melena.   3. CKD Stage III:  - BMET today.  4. Medtronic PPM for symptomatic bradycardia.   - Stable.  5. CAD:  - Possible old infarct on Myoview - Stable, chest pain free.  - No ASA with Eliquis - Continue statin, LDL 57 in Aug. 2017,.  6. ?Claudication: Foot pain with ambulation.  - ABI's normal.   7. Carotid stenosis:  - Will need repeat carotid dopplers in 9/18.  8. HTN:  - Stable on current regimen.   Follow up in 2 weeks with Vanessa Kick NP-C  03/18/2017

## 2017-03-18 NOTE — Telephone Encounter (Signed)
Erie Noe with [facility] request to add on [facility] nursing and heart failure teaching; if can get order now should be able to have nurse see pt today. Dr Para March said that was OK. Erie Noe voiced understanding. FYI to Dr Para March.

## 2017-03-18 NOTE — Patient Instructions (Addendum)
Take Metolazone 2.5 mg TODAY  Increase Furosemide to 80 mg Twice daily for 2 DAYS ONLY  Increase Potassium to 20 meq daily FOR 2 DAYS ONLY  Follow up as scheduled with Dr Shirlee Latch on 04/01/17

## 2017-03-18 NOTE — Addendum Note (Signed)
Encounter addended by: Noralee Space, RN on: 03/18/2017  3:53 PM<BR>    Actions taken: Order list changed

## 2017-03-18 NOTE — Progress Notes (Signed)
Advanced Heart Failure Medication Review by a Pharmacist  Does the patient  feel that his/her medications are working for him/her?  yes  Has the patient been experiencing any side effects to the medications prescribed?  no  Does the patient measure his/her own blood pressure or blood glucose at home?  yes   Does the patient have any problems obtaining medications due to transportation or finances?   no  Understanding of regimen: excellent Understanding of indications: excellent Potential of compliance: excellent Patient understands to avoid NSAIDs. Patient understands to avoid decongestants.  Issues to address at subsequent visits: none   Pharmacist comments: Danielle Benitez is a pleasant 83 y/o F who presented with her daughter who manages her medications. Daughter reports patient's good adherence to her medications. They did not have any medication-related questions or concerns at this time  Danielle Benitez   Time with patient: 8 minutes  Preparation and documentation time: 10 minutes Total time: 18 minutes

## 2017-03-21 DIAGNOSIS — I5022 Chronic systolic (congestive) heart failure: Secondary | ICD-10-CM | POA: Diagnosis not present

## 2017-03-21 DIAGNOSIS — E1122 Type 2 diabetes mellitus with diabetic chronic kidney disease: Secondary | ICD-10-CM | POA: Diagnosis not present

## 2017-03-21 DIAGNOSIS — Z9181 History of falling: Secondary | ICD-10-CM | POA: Diagnosis not present

## 2017-03-21 DIAGNOSIS — Z794 Long term (current) use of insulin: Secondary | ICD-10-CM | POA: Diagnosis not present

## 2017-03-21 DIAGNOSIS — E785 Hyperlipidemia, unspecified: Secondary | ICD-10-CM | POA: Diagnosis not present

## 2017-03-21 DIAGNOSIS — M109 Gout, unspecified: Secondary | ICD-10-CM | POA: Diagnosis not present

## 2017-03-21 DIAGNOSIS — I130 Hypertensive heart and chronic kidney disease with heart failure and stage 1 through stage 4 chronic kidney disease, or unspecified chronic kidney disease: Secondary | ICD-10-CM | POA: Diagnosis not present

## 2017-03-21 DIAGNOSIS — N183 Chronic kidney disease, stage 3 (moderate): Secondary | ICD-10-CM | POA: Diagnosis not present

## 2017-03-21 DIAGNOSIS — R269 Unspecified abnormalities of gait and mobility: Secondary | ICD-10-CM | POA: Diagnosis not present

## 2017-03-21 DIAGNOSIS — S99922D Unspecified injury of left foot, subsequent encounter: Secondary | ICD-10-CM | POA: Diagnosis not present

## 2017-03-21 DIAGNOSIS — M25572 Pain in left ankle and joints of left foot: Secondary | ICD-10-CM | POA: Diagnosis not present

## 2017-03-21 DIAGNOSIS — E1142 Type 2 diabetes mellitus with diabetic polyneuropathy: Secondary | ICD-10-CM | POA: Diagnosis not present

## 2017-03-21 DIAGNOSIS — J449 Chronic obstructive pulmonary disease, unspecified: Secondary | ICD-10-CM | POA: Diagnosis not present

## 2017-03-21 DIAGNOSIS — I951 Orthostatic hypotension: Secondary | ICD-10-CM | POA: Diagnosis not present

## 2017-03-21 DIAGNOSIS — I2510 Atherosclerotic heart disease of native coronary artery without angina pectoris: Secondary | ICD-10-CM | POA: Diagnosis not present

## 2017-03-21 DIAGNOSIS — I4891 Unspecified atrial fibrillation: Secondary | ICD-10-CM | POA: Diagnosis not present

## 2017-03-23 DIAGNOSIS — Z9181 History of falling: Secondary | ICD-10-CM | POA: Diagnosis not present

## 2017-03-23 DIAGNOSIS — Z794 Long term (current) use of insulin: Secondary | ICD-10-CM | POA: Diagnosis not present

## 2017-03-23 DIAGNOSIS — N183 Chronic kidney disease, stage 3 (moderate): Secondary | ICD-10-CM | POA: Diagnosis not present

## 2017-03-23 DIAGNOSIS — E1142 Type 2 diabetes mellitus with diabetic polyneuropathy: Secondary | ICD-10-CM | POA: Diagnosis not present

## 2017-03-23 DIAGNOSIS — M109 Gout, unspecified: Secondary | ICD-10-CM | POA: Diagnosis not present

## 2017-03-23 DIAGNOSIS — I4891 Unspecified atrial fibrillation: Secondary | ICD-10-CM | POA: Diagnosis not present

## 2017-03-23 DIAGNOSIS — I130 Hypertensive heart and chronic kidney disease with heart failure and stage 1 through stage 4 chronic kidney disease, or unspecified chronic kidney disease: Secondary | ICD-10-CM | POA: Diagnosis not present

## 2017-03-23 DIAGNOSIS — I5022 Chronic systolic (congestive) heart failure: Secondary | ICD-10-CM | POA: Diagnosis not present

## 2017-03-23 DIAGNOSIS — E785 Hyperlipidemia, unspecified: Secondary | ICD-10-CM | POA: Diagnosis not present

## 2017-03-23 DIAGNOSIS — E1122 Type 2 diabetes mellitus with diabetic chronic kidney disease: Secondary | ICD-10-CM | POA: Diagnosis not present

## 2017-03-23 DIAGNOSIS — M25572 Pain in left ankle and joints of left foot: Secondary | ICD-10-CM | POA: Diagnosis not present

## 2017-03-23 DIAGNOSIS — I951 Orthostatic hypotension: Secondary | ICD-10-CM | POA: Diagnosis not present

## 2017-03-23 DIAGNOSIS — I2510 Atherosclerotic heart disease of native coronary artery without angina pectoris: Secondary | ICD-10-CM | POA: Diagnosis not present

## 2017-03-23 DIAGNOSIS — S99922D Unspecified injury of left foot, subsequent encounter: Secondary | ICD-10-CM | POA: Diagnosis not present

## 2017-03-23 DIAGNOSIS — J449 Chronic obstructive pulmonary disease, unspecified: Secondary | ICD-10-CM | POA: Diagnosis not present

## 2017-03-23 DIAGNOSIS — R269 Unspecified abnormalities of gait and mobility: Secondary | ICD-10-CM | POA: Diagnosis not present

## 2017-03-24 DIAGNOSIS — R269 Unspecified abnormalities of gait and mobility: Secondary | ICD-10-CM | POA: Diagnosis not present

## 2017-03-24 DIAGNOSIS — E1122 Type 2 diabetes mellitus with diabetic chronic kidney disease: Secondary | ICD-10-CM | POA: Diagnosis not present

## 2017-03-24 DIAGNOSIS — M109 Gout, unspecified: Secondary | ICD-10-CM | POA: Diagnosis not present

## 2017-03-24 DIAGNOSIS — S99922D Unspecified injury of left foot, subsequent encounter: Secondary | ICD-10-CM | POA: Diagnosis not present

## 2017-03-24 DIAGNOSIS — M25572 Pain in left ankle and joints of left foot: Secondary | ICD-10-CM | POA: Diagnosis not present

## 2017-03-24 DIAGNOSIS — J449 Chronic obstructive pulmonary disease, unspecified: Secondary | ICD-10-CM | POA: Diagnosis not present

## 2017-03-24 DIAGNOSIS — E1142 Type 2 diabetes mellitus with diabetic polyneuropathy: Secondary | ICD-10-CM | POA: Diagnosis not present

## 2017-03-24 DIAGNOSIS — E785 Hyperlipidemia, unspecified: Secondary | ICD-10-CM | POA: Diagnosis not present

## 2017-03-24 DIAGNOSIS — I951 Orthostatic hypotension: Secondary | ICD-10-CM | POA: Diagnosis not present

## 2017-03-24 DIAGNOSIS — N183 Chronic kidney disease, stage 3 (moderate): Secondary | ICD-10-CM | POA: Diagnosis not present

## 2017-03-24 DIAGNOSIS — I130 Hypertensive heart and chronic kidney disease with heart failure and stage 1 through stage 4 chronic kidney disease, or unspecified chronic kidney disease: Secondary | ICD-10-CM | POA: Diagnosis not present

## 2017-03-24 DIAGNOSIS — Z9181 History of falling: Secondary | ICD-10-CM | POA: Diagnosis not present

## 2017-03-24 DIAGNOSIS — Z794 Long term (current) use of insulin: Secondary | ICD-10-CM | POA: Diagnosis not present

## 2017-03-24 DIAGNOSIS — I2510 Atherosclerotic heart disease of native coronary artery without angina pectoris: Secondary | ICD-10-CM | POA: Diagnosis not present

## 2017-03-24 DIAGNOSIS — I4891 Unspecified atrial fibrillation: Secondary | ICD-10-CM | POA: Diagnosis not present

## 2017-03-24 DIAGNOSIS — I5022 Chronic systolic (congestive) heart failure: Secondary | ICD-10-CM | POA: Diagnosis not present

## 2017-03-29 DIAGNOSIS — I4891 Unspecified atrial fibrillation: Secondary | ICD-10-CM | POA: Diagnosis not present

## 2017-03-29 DIAGNOSIS — M109 Gout, unspecified: Secondary | ICD-10-CM | POA: Diagnosis not present

## 2017-03-29 DIAGNOSIS — R269 Unspecified abnormalities of gait and mobility: Secondary | ICD-10-CM | POA: Diagnosis not present

## 2017-03-29 DIAGNOSIS — Z794 Long term (current) use of insulin: Secondary | ICD-10-CM | POA: Diagnosis not present

## 2017-03-29 DIAGNOSIS — M25572 Pain in left ankle and joints of left foot: Secondary | ICD-10-CM | POA: Diagnosis not present

## 2017-03-29 DIAGNOSIS — E1142 Type 2 diabetes mellitus with diabetic polyneuropathy: Secondary | ICD-10-CM | POA: Diagnosis not present

## 2017-03-29 DIAGNOSIS — I130 Hypertensive heart and chronic kidney disease with heart failure and stage 1 through stage 4 chronic kidney disease, or unspecified chronic kidney disease: Secondary | ICD-10-CM | POA: Diagnosis not present

## 2017-03-29 DIAGNOSIS — E785 Hyperlipidemia, unspecified: Secondary | ICD-10-CM | POA: Diagnosis not present

## 2017-03-29 DIAGNOSIS — S99922D Unspecified injury of left foot, subsequent encounter: Secondary | ICD-10-CM | POA: Diagnosis not present

## 2017-03-29 DIAGNOSIS — Z9181 History of falling: Secondary | ICD-10-CM | POA: Diagnosis not present

## 2017-03-29 DIAGNOSIS — I5022 Chronic systolic (congestive) heart failure: Secondary | ICD-10-CM | POA: Diagnosis not present

## 2017-03-29 DIAGNOSIS — I2510 Atherosclerotic heart disease of native coronary artery without angina pectoris: Secondary | ICD-10-CM | POA: Diagnosis not present

## 2017-03-29 DIAGNOSIS — E1122 Type 2 diabetes mellitus with diabetic chronic kidney disease: Secondary | ICD-10-CM | POA: Diagnosis not present

## 2017-03-29 DIAGNOSIS — J449 Chronic obstructive pulmonary disease, unspecified: Secondary | ICD-10-CM | POA: Diagnosis not present

## 2017-03-29 DIAGNOSIS — I951 Orthostatic hypotension: Secondary | ICD-10-CM | POA: Diagnosis not present

## 2017-03-29 DIAGNOSIS — N183 Chronic kidney disease, stage 3 (moderate): Secondary | ICD-10-CM | POA: Diagnosis not present

## 2017-03-31 ENCOUNTER — Emergency Department (HOSPITAL_COMMUNITY): Payer: Medicare Other

## 2017-03-31 ENCOUNTER — Encounter (HOSPITAL_COMMUNITY): Payer: Self-pay | Admitting: Emergency Medicine

## 2017-03-31 ENCOUNTER — Telehealth (HOSPITAL_COMMUNITY): Payer: Self-pay

## 2017-03-31 ENCOUNTER — Telehealth: Payer: Self-pay

## 2017-03-31 ENCOUNTER — Inpatient Hospital Stay (HOSPITAL_COMMUNITY)
Admission: EM | Admit: 2017-03-31 | Discharge: 2017-04-05 | DRG: 291 | Disposition: A | Discharge status: Home / Self Care | Payer: Medicare Other | Attending: Family Medicine | Admitting: Family Medicine

## 2017-03-31 DIAGNOSIS — E785 Hyperlipidemia, unspecified: Secondary | ICD-10-CM | POA: Diagnosis present

## 2017-03-31 DIAGNOSIS — Z79899 Other long term (current) drug therapy: Secondary | ICD-10-CM | POA: Diagnosis not present

## 2017-03-31 DIAGNOSIS — K219 Gastro-esophageal reflux disease without esophagitis: Secondary | ICD-10-CM | POA: Diagnosis present

## 2017-03-31 DIAGNOSIS — D649 Anemia, unspecified: Secondary | ICD-10-CM | POA: Diagnosis not present

## 2017-03-31 DIAGNOSIS — I252 Old myocardial infarction: Secondary | ICD-10-CM | POA: Diagnosis not present

## 2017-03-31 DIAGNOSIS — N183 Chronic kidney disease, stage 3 (moderate): Secondary | ICD-10-CM | POA: Diagnosis not present

## 2017-03-31 DIAGNOSIS — IMO0002 Reserved for concepts with insufficient information to code with codable children: Secondary | ICD-10-CM | POA: Diagnosis present

## 2017-03-31 DIAGNOSIS — I481 Persistent atrial fibrillation: Secondary | ICD-10-CM | POA: Diagnosis not present

## 2017-03-31 DIAGNOSIS — R29810 Facial weakness: Secondary | ICD-10-CM

## 2017-03-31 DIAGNOSIS — I5033 Acute on chronic diastolic (congestive) heart failure: Secondary | ICD-10-CM | POA: Diagnosis not present

## 2017-03-31 DIAGNOSIS — J441 Chronic obstructive pulmonary disease with (acute) exacerbation: Secondary | ICD-10-CM | POA: Diagnosis present

## 2017-03-31 DIAGNOSIS — R404 Transient alteration of awareness: Secondary | ICD-10-CM | POA: Diagnosis not present

## 2017-03-31 DIAGNOSIS — G459 Transient cerebral ischemic attack, unspecified: Secondary | ICD-10-CM | POA: Diagnosis not present

## 2017-03-31 DIAGNOSIS — I10 Essential (primary) hypertension: Secondary | ICD-10-CM | POA: Diagnosis present

## 2017-03-31 DIAGNOSIS — I2510 Atherosclerotic heart disease of native coronary artery without angina pectoris: Secondary | ICD-10-CM | POA: Diagnosis present

## 2017-03-31 DIAGNOSIS — E1121 Type 2 diabetes mellitus with diabetic nephropathy: Secondary | ICD-10-CM

## 2017-03-31 DIAGNOSIS — R531 Weakness: Secondary | ICD-10-CM

## 2017-03-31 DIAGNOSIS — I4891 Unspecified atrial fibrillation: Secondary | ICD-10-CM | POA: Diagnosis present

## 2017-03-31 DIAGNOSIS — R001 Bradycardia, unspecified: Secondary | ICD-10-CM | POA: Diagnosis present

## 2017-03-31 DIAGNOSIS — Z794 Long term (current) use of insulin: Secondary | ICD-10-CM

## 2017-03-31 DIAGNOSIS — I130 Hypertensive heart and chronic kidney disease with heart failure and stage 1 through stage 4 chronic kidney disease, or unspecified chronic kidney disease: Secondary | ICD-10-CM | POA: Diagnosis not present

## 2017-03-31 DIAGNOSIS — D509 Iron deficiency anemia, unspecified: Secondary | ICD-10-CM | POA: Diagnosis present

## 2017-03-31 DIAGNOSIS — E875 Hyperkalemia: Secondary | ICD-10-CM | POA: Diagnosis not present

## 2017-03-31 DIAGNOSIS — E669 Obesity, unspecified: Secondary | ICD-10-CM | POA: Diagnosis present

## 2017-03-31 DIAGNOSIS — R4781 Slurred speech: Secondary | ICD-10-CM | POA: Diagnosis not present

## 2017-03-31 DIAGNOSIS — Z888 Allergy status to other drugs, medicaments and biological substances status: Secondary | ICD-10-CM | POA: Diagnosis not present

## 2017-03-31 DIAGNOSIS — Z950 Presence of cardiac pacemaker: Secondary | ICD-10-CM

## 2017-03-31 DIAGNOSIS — E1122 Type 2 diabetes mellitus with diabetic chronic kidney disease: Secondary | ICD-10-CM | POA: Diagnosis not present

## 2017-03-31 DIAGNOSIS — R110 Nausea: Secondary | ICD-10-CM | POA: Diagnosis present

## 2017-03-31 DIAGNOSIS — R51 Headache: Secondary | ICD-10-CM | POA: Diagnosis not present

## 2017-03-31 DIAGNOSIS — I5043 Acute on chronic combined systolic (congestive) and diastolic (congestive) heart failure: Secondary | ICD-10-CM | POA: Diagnosis present

## 2017-03-31 DIAGNOSIS — I480 Paroxysmal atrial fibrillation: Secondary | ICD-10-CM | POA: Diagnosis not present

## 2017-03-31 DIAGNOSIS — D696 Thrombocytopenia, unspecified: Secondary | ICD-10-CM | POA: Diagnosis not present

## 2017-03-31 DIAGNOSIS — N184 Chronic kidney disease, stage 4 (severe): Secondary | ICD-10-CM | POA: Diagnosis present

## 2017-03-31 DIAGNOSIS — R42 Dizziness and giddiness: Secondary | ICD-10-CM | POA: Diagnosis not present

## 2017-03-31 DIAGNOSIS — S199XXA Unspecified injury of neck, initial encounter: Secondary | ICD-10-CM | POA: Diagnosis not present

## 2017-03-31 DIAGNOSIS — Z7901 Long term (current) use of anticoagulants: Secondary | ICD-10-CM

## 2017-03-31 DIAGNOSIS — E1165 Type 2 diabetes mellitus with hyperglycemia: Secondary | ICD-10-CM | POA: Diagnosis not present

## 2017-03-31 DIAGNOSIS — E1129 Type 2 diabetes mellitus with other diabetic kidney complication: Secondary | ICD-10-CM | POA: Diagnosis present

## 2017-03-31 HISTORY — DX: Cerebral infarction, unspecified: I63.9

## 2017-03-31 HISTORY — DX: Iron deficiency: E61.1

## 2017-03-31 HISTORY — DX: Disorder of arteries and arterioles, unspecified: I77.9

## 2017-03-31 HISTORY — DX: Chronic diastolic (congestive) heart failure: I50.32

## 2017-03-31 HISTORY — DX: Paroxysmal atrial fibrillation: I48.0

## 2017-03-31 HISTORY — DX: Peripheral vascular disease, unspecified: I73.9

## 2017-03-31 LAB — I-STAT CHEM 8, ED
BUN: 35 mg/dL — ABNORMAL HIGH (ref 6–20)
CHLORIDE: 101 mmol/L (ref 101–111)
CREATININE: 1.6 mg/dL — AB (ref 0.44–1.00)
Calcium, Ion: 1.09 mmol/L — ABNORMAL LOW (ref 1.15–1.40)
Glucose, Bld: 195 mg/dL — ABNORMAL HIGH (ref 65–99)
HEMATOCRIT: 39 % (ref 36.0–46.0)
Hemoglobin: 13.3 g/dL (ref 12.0–15.0)
Potassium: 4.2 mmol/L (ref 3.5–5.1)
SODIUM: 140 mmol/L (ref 135–145)
TCO2: 29 mmol/L (ref 0–100)

## 2017-03-31 LAB — DIFFERENTIAL
BASOS ABS: 0 10*3/uL (ref 0.0–0.1)
Basophils Relative: 0 %
Eosinophils Absolute: 0 10*3/uL (ref 0.0–0.7)
Eosinophils Relative: 0 %
LYMPHS ABS: 1.2 10*3/uL (ref 0.7–4.0)
LYMPHS PCT: 14 %
Monocytes Absolute: 0.6 10*3/uL (ref 0.1–1.0)
Monocytes Relative: 7 %
NEUTROS ABS: 6.9 10*3/uL (ref 1.7–7.7)
NEUTROS PCT: 79 %

## 2017-03-31 LAB — COMPREHENSIVE METABOLIC PANEL
ALBUMIN: 2.5 g/dL — AB (ref 3.5–5.0)
ALK PHOS: 150 U/L — AB (ref 38–126)
ALT: 34 U/L (ref 14–54)
AST: 70 U/L — AB (ref 15–41)
Anion gap: 10 (ref 5–15)
BILIRUBIN TOTAL: 1.4 mg/dL — AB (ref 0.3–1.2)
BUN: 27 mg/dL — AB (ref 6–20)
CALCIUM: 8.8 mg/dL — AB (ref 8.9–10.3)
CO2: 26 mmol/L (ref 22–32)
Chloride: 101 mmol/L (ref 101–111)
Creatinine, Ser: 1.79 mg/dL — ABNORMAL HIGH (ref 0.44–1.00)
GFR calc Af Amer: 29 mL/min — ABNORMAL LOW (ref >60)
GFR calc non Af Amer: 25 mL/min — ABNORMAL LOW (ref >60)
GLUCOSE: 203 mg/dL — AB (ref 65–99)
Potassium: 4.2 mmol/L (ref 3.5–5.1)
SODIUM: 137 mmol/L (ref 135–145)
Total Protein: 7.1 g/dL (ref 6.5–8.1)

## 2017-03-31 LAB — URINALYSIS, ROUTINE W REFLEX MICROSCOPIC
Bilirubin Urine: NEGATIVE
Glucose, UA: NEGATIVE mg/dL
Hgb urine dipstick: NEGATIVE
Ketones, ur: NEGATIVE mg/dL
Leukocytes, UA: NEGATIVE
NITRITE: NEGATIVE
PH: 7 (ref 5.0–8.0)
Protein, ur: NEGATIVE mg/dL
SPECIFIC GRAVITY, URINE: 1.006 (ref 1.005–1.030)

## 2017-03-31 LAB — GLUCOSE, CAPILLARY: GLUCOSE-CAPILLARY: 71 mg/dL (ref 65–99)

## 2017-03-31 LAB — APTT: aPTT: 28 seconds (ref 24–36)

## 2017-03-31 LAB — CBC
HCT: 36.9 % (ref 36.0–46.0)
HEMOGLOBIN: 11.4 g/dL — AB (ref 12.0–15.0)
MCH: 27.9 pg (ref 26.0–34.0)
MCHC: 30.9 g/dL (ref 30.0–36.0)
MCV: 90.4 fL (ref 78.0–100.0)
PLATELETS: 103 10*3/uL — AB (ref 150–400)
RBC: 4.08 MIL/uL (ref 3.87–5.11)
RDW: 19.2 % — ABNORMAL HIGH (ref 11.5–15.5)
WBC: 8.8 10*3/uL (ref 4.0–10.5)

## 2017-03-31 LAB — I-STAT TROPONIN, ED: Troponin i, poc: 0 ng/mL (ref 0.00–0.08)

## 2017-03-31 LAB — PROTIME-INR
INR: 1.54
PROTHROMBIN TIME: 18.7 s — AB (ref 11.4–15.2)

## 2017-03-31 LAB — RAPID URINE DRUG SCREEN, HOSP PERFORMED
AMPHETAMINES: NOT DETECTED
BARBITURATES: NOT DETECTED
Benzodiazepines: NOT DETECTED
Cocaine: NOT DETECTED
Opiates: NOT DETECTED
TETRAHYDROCANNABINOL: NOT DETECTED

## 2017-03-31 LAB — BRAIN NATRIURETIC PEPTIDE: B NATRIURETIC PEPTIDE 5: 398.4 pg/mL — AB (ref 0.0–100.0)

## 2017-03-31 LAB — ETHANOL: Alcohol, Ethyl (B): <5 mg/dL (ref <5)

## 2017-03-31 MED ORDER — STROKE: EARLY STAGES OF RECOVERY BOOK
Freq: Once | Status: DC
  Filled 2017-03-31: qty 1

## 2017-03-31 MED ORDER — SODIUM CHLORIDE 0.9 % IV SOLN: INTRAVENOUS | Status: DC

## 2017-03-31 NOTE — ED Notes (Signed)
Pt. Transported to MRI 

## 2017-03-31 NOTE — ED Triage Notes (Signed)
Per EMS pt comes from home with c/o slurred speech that started at 0800. Pt LSN at 2200. Pt has h/o CHF and ascites. Pt was advised to take a fluid pill and a "super pill" this morning for the swelling in her abd and left arm/leg. Pt has had several falls this past week. Pt has a "frontal headache." VSS. 106-cbg.

## 2017-03-31 NOTE — ED Notes (Signed)
Pt states headache stop hurting.

## 2017-03-31 NOTE — Telephone Encounter (Signed)
Danielle Benitez pts daughter (DPR signed) said pt is very puffy again today in face, Lt hand, both feet, h/a, dizzy, slight SOB,shaking; BS and BP is OK; heart rate 80 and relatively reqular. Pt has appt at CHF clinic 04/01/17; pt has taken one metolazone as instructed. Danielle Benitez has spoken with Danielle Millet at CHF clinic and suggested pt get EKG or be seen by PCP; I spoke with Danielle Millet and she said she will contact Danielle Benitez about getting pt in for EKG. After I went back on phone to let Danielle Benitez know that Danielle Millet was going to call her; pt has developed some slurred speech, pt is still shaking after eating crackers and drinking juice. Pt could not stand to use bathroom. Dr Para March advised to call 911 and go to ED. I spoke with pt and slurred speech was noted while pt was talking with me; Pt wants to wait and see if she gets to feeling better. Advised pt she needs to go to ED now and that was Dr Armanda Heritage best care for pt. Pt will go to ED and Danielle Benitez is calling EMS.FYI to Dr Para March.

## 2017-03-31 NOTE — ED Notes (Signed)
Pt. Returned from CT.

## 2017-03-31 NOTE — Telephone Encounter (Signed)
Patient returned call to CHF clinic to now state patient has slurred speach. Advised to call 911 and report to ED. Will make Dr. Shirlee Latch aware.  Ave Filter, RN

## 2017-03-31 NOTE — ED Provider Notes (Signed)
[facility]-EMERGENCY DEPT Provider Note   CSN: [id number] Arrival date & time: 03/31/17  1113     History   Chief Complaint Chief Complaint  Patient presents with  . Weakness    HPI Danielle Benitez is a 83 y.o. female who presents by EMS for slurred speech, weakness and dizziness.  Patient last seen normal yesterday at 2200.  She reports she woke up with her symptoms, including headache, today.  Her daughter is present and assists with history.  Daughter reports that this morning patient was week, unable to stand, and had slurred speech.  Daughter reports that patient had been having increased fluid retention today, that she appears more "puffy" than normal.  Daughter reports that patient has been falling multiple times over the past week for unknown reason. Patient has not missed any doses of medication.  Her daughter reports that patients speech is better now than it was this morning, but it is still not her baseline, that she has mild left facial droop.   HPI  Past Medical History:  Diagnosis Date  . Abnormality of gait   . Anemia, unspecified   . Anticoagulated- Eliquis 07/31/2015  . Anxiety state, unspecified   . Aortic stenosis    a. Mild by echo 08/2013.  Marland Kitchen Atrial fibrillation - developed 07/11/15 by PPM interrogation, recurrent after DCCV 10/14 07/11/2015    recurrent after DCCV 10/14  . Bifascicular block    afib  . Chronic systolic CHF (congestive heart failure) (HCC)    a. Dx 07/2015 - EF 35-40%, unclear etiology  . CKD (chronic kidney disease), stage III   . Depressive disorder, not elsewhere classified   . DM (diabetes mellitus), type 2, uncontrolled, with renal complications (HCC) 01/08/2012  . Eczema 11/22/2013  . Esophageal dysmotility 08/05/2015  . Essential hypertension   . Female stress incontinence 05/06/2015  . Full dentures   . GERD (gastroesophageal reflux disease)   . Gout   . HOH (hard of hearing)   . Internal hemorrhoids without mention of complication   .  Lumbago   . Memory loss   . Mild CAD    a. Cath 2013: 20-30% prox RCA.  Marland Kitchen Myocardial infarction (HCC) 07/2015  . Nonspecific abnormal results of liver function study   . Obesity, unspecified   . Orthostatic hypotension   . Osteoarthritis   . Other and unspecified hyperlipidemia   . Pain in joint, shoulder region 11/22/2013   left   . Personal history of fall   . Presence of permanent cardiac pacemaker   . Pruritus 11/22/2013  . S/P cardiac pacemaker procedure, PPM Medtronic REVO placed 01/11/2012 01/11/2012   a. for symptomatic bradycardia. Followed by Dr. Ladona Ridgel.  . Shortness of breath dyspnea    with movement-  . Wears glasses     Patient Active Problem List   Diagnosis Date Noted  . Ankle pain 02/24/2017  . Chest pain 02/14/2017  . Type 2 diabetes mellitus with diabetic neuropathy, unspecified (HCC) 01/26/2017  . Dysuria 09/21/2016  . Fall 06/25/2016  . Skin tear of elbow without complication 06/24/2016  . Injury of left shoulder and upper arm 06/24/2016  . Unequal blood pressure in upper extremities 06/03/2016  . Cardiac pacemaker in situ 04/12/2016  . Cough 03/08/2016  . Abnormality of gait 03/05/2016  . Risk for falls 03/05/2016  . Insomnia 01/21/2016  . Chronic systolic congestive heart failure, NYHA class 3 (HCC) 01/20/2016  . LFT elevation 11/21/2015  . Weakness 11/06/2015  . Dysphagia   .  Esophageal dysmotility 08/05/2015  . Anticoagulated- Eliquis 07/31/2015  . Normochromic normocytic anemia 07/31/2015  . Atrial fibrillation - 07/11/15 by PPM interrogation, recurrent 10/19 after DCCV 10/14 07/18/2015  . Moderate mitral regurgitation 07/18/2015  . Moderate tricuspid regurgitation 07/18/2015  . History of pacemaker - Medtronic 07/18/2015  . CKD (chronic kidney disease), stage III   . Orthostatic hypotension   . Essential hypertension   . Pericardial effusion 07/15/2015  . Female stress incontinence 05/06/2015  . Knee pain 12/18/2014  . Low back pain 02/27/2014    . Obese 02/27/2014  . Depression 02/27/2014  . Pain in joint, shoulder region 11/22/2013  . Eczema 11/22/2013  . S/P cardiac pacemaker procedure, PPM Medtronic REVO placed 01/11/2012 01/11/2012  . COPD bronchitis by CXR 01/10/2012  . Dyslipidemia 01/10/2012  . Mild CAD - 20-30% RCA in 2013 01/10/2012  . Fatigue 01/10/2012  . Sinus bradycardia, HR as low As 35 01/10/2012  . Mild aortic stenosis 01/10/2012  . Chest wall pain 01/08/2012  . SOB (shortness of breath) 01/08/2012  . DM, type 2, with renal complications  01/08/2012    Past Surgical History:  Procedure Laterality Date  . CARDIAC CATHETERIZATION  12/03/2003   Dr Clarene Duke  . CARDIOVERSION N/A 07/25/2015   Procedure: CARDIOVERSION;  Surgeon: Pricilla Riffle, MD;  Location: [facility] ENDOSCOPY;  Service: Cardiovascular;  Laterality: N/A;  . CARDIOVERSION N/A 08/06/2015   Procedure: CARDIOVERSION;  Surgeon: Laurey Morale, MD;  Location: [facility] ENDOSCOPY;  Service: Cardiovascular;  Laterality: N/A;  . CATARACT EXTRACTION W/ INTRAOCULAR LENS  IMPLANT, BILATERAL Bilateral 03/2015-04/2015  . COLONOSCOPY     ?????  . EP IMPLANTABLE DEVICE N/A 04/15/2016   Procedure: Pacemaker Implant;  Surgeon: Marinus Maw, MD;  Location: [facility] INVASIVE CV LAB;  Service: Cardiovascular;  Laterality: N/A;  . ESOPHAGOGASTRODUODENOSCOPY N/A 08/08/2015   Procedure: ESOPHAGOGASTRODUODENOSCOPY (EGD);  Surgeon: Iva Boop, MD;  Location: [facility] ENDOSCOPY;  Service: Endoscopy;  Laterality: N/A;  . EXTREMITY CYST EXCISION     finger  . EYE SURGERY Bilateral    catartact  . INSERT / REPLACE / REMOVE PACEMAKER    . LEFT HEART CATHETERIZATION WITH CORONARY ANGIOGRAM N/A 01/10/2012   Procedure: LEFT HEART CATHETERIZATION WITH CORONARY ANGIOGRAM;  Surgeon: Chrystie Nose, MD;  Location: [facility] CATH LAB;  Service: Cardiovascular;  Laterality: N/A;  . ORIF WRIST FRACTURE Right 04/09/2014   Procedure: OPEN REDUCTION INTERNAL FIXATION (ORIF) RIGHT DISPLACED  DISTAL RADIUS  FRACTURE;   Surgeon: Dominica Severin, MD;  Location: [facility];  Service: Orthopedics;  Laterality: Right;  . PACEMAKER LEAD REMOVAL  04/12/2016   TEMPORARY PACEMAKER INSERTION  . PACEMAKER LEAD REMOVAL  04/15/2016   Procedure: Pacemaker Lead Removal;  Surgeon: Marinus Maw, MD;  Location: [facility] INVASIVE CV LAB;  Service: Cardiovascular;;  . PACEMAKER LEAD REMOVAL N/A 04/12/2016   Procedure: PACEMAKER LEAD REMOVAL;  Surgeon: Marinus Maw, MD;  Location: [facility] OR;  Service: Cardiovascular;  Laterality: N/A;  . PERMANENT PACEMAKER INSERTION Left 01/11/2012   Procedure: PERMANENT PACEMAKER INSERTION;  Surgeon: Thurmon Fair, MD;  Location: [facility] CATH LAB;  Service: Cardiovascular;  Laterality: Left;  . TEE WITHOUT CARDIOVERSION N/A 07/25/2015   Procedure: TRANSESOPHAGEAL ECHOCARDIOGRAM (TEE);  Surgeon: Pricilla Riffle, MD;  Location: [facility] ENDOSCOPY;  Service: Cardiovascular;  Laterality: N/A;    OB History    No data available       Home Medications    Prior to Admission medications   Medication Sig Start Date End Date  Taking? Authorizing Provider  acetaminophen (TYLENOL) 325 MG tablet Take 1-2 tablets (325-650 mg total) by mouth 3 (three) times daily as needed. 04/05/16   Joaquim Nam, MD  albuterol (PROVENTIL HFA;VENTOLIN HFA) 108 (90 Base) MCG/ACT inhaler Inhale 2 puffs into the lungs every 4 (four) hours as needed for wheezing or shortness of breath (cough, shortness of breath or wheezing.). 10/15/16   Emi Belfast, FNP  allopurinol (ZYLOPRIM) 300 MG tablet TAKE 1 TABLET BY MOUTH DAILY 11/24/16   Joaquim Nam, MD  amiodarone (PACERONE) 200 MG tablet TAKE 1 TABLET BY MOUTH DAILY 02/14/17   Laurey Morale, MD  atorvastatin (LIPITOR) 40 MG tablet TAKE ONE-HALF (0.5) TABLET BY MOUTH ONCEDAILY AT 6PM 12/27/16   Joaquim Nam, MD  DULoxetine (CYMBALTA) 20 MG capsule TAKE ONE CAPSULE BY MOUTH EVERY NIGHT AT BEDTIME 01/06/17   Joaquim Nam, MD  ELIQUIS 2.5 MG TABS tablet TAKE 1 TABLET BY MOUTH  TWICE A DAY 02/14/17   Bensimhon, Bevelyn Buckles, MD  fluticasone (FLONASE) 50 MCG/ACT nasal spray Place 1 spray into both nostrils daily as needed for allergies or rhinitis.    [provider]  furosemide (LASIX) 40 MG tablet Take 1 tablet (40 mg total) by mouth 2 (two) times daily. 10/25/16   Laurey Morale, MD  GAS RELIEF 80 MG chewable tablet CHEW ONE TABLET BY MOUTH EVERY 6 HOURS AS NEEDED FOR GAS. 11/06/16   Joaquim Nam, MD  glimepiride (AMARYL) 1 MG tablet TAKE 1 TABLET BY MOUTH DAILY WITH BREAKFAST 10/08/16   Joaquim Nam, MD  Insulin Glargine (LANTUS SOLOSTAR) 100 UNIT/ML Solostar Pen Inject 5 Units into the skin daily at 10 pm. Patient taking differently: Inject 21 Units into the skin daily at 10 pm. If CBG is greater than 150 add 1 unit daily  If less than 100 subtract 1 unit from previous day If 100-150 then no change 01/27/17   Joaquim Nam, MD  Insulin Pen Needle (PEN NEEDLES) 30G X 5 MM MISC Inject 1 Dose as directed daily. Use with insulin pen 01/27/17   Joaquim Nam, MD  isosorbide mononitrate (IMDUR) 30 MG 24 hr tablet Take 1 tablet (30 mg total) by mouth daily. 03/10/17   Bensimhon, Bevelyn Buckles, MD  loratadine (CLARITIN CHILDRENS) 5 MG chewable tablet Chew 1 tablet (5 mg total) by mouth daily. 01/26/17   Joaquim Nam, MD  metolazone (ZAROXOLYN) 2.5 MG tablet Take 1 tablet (2.5 mg total) by mouth as directed. 03/18/17 06/16/17  Little Ishikawa, NP  metoprolol succinate (TOPROL-XL) 50 MG 24 hr tablet Take 1 tablet (50 mg total) by mouth 2 (two) times daily. Take with or immediately following a meal. 02/21/17   Clegg, Amy D, NP  Multiple Vitamins-Minerals (ONE-A-DAY 50 PLUS PO) Take 1 capsule by mouth daily.    [provider]  ONE TOUCH ULTRA TEST test strip USE TO CHECK BLOOD SUGAR ONCE A DAY AS DIRECTED 08/10/16   Joaquim Nam, MD  ONGLYZA 2.5 MG TABS tablet TAKE 1 TABLET BY MOUTH DAILY 02/02/17   Joaquim Nam, MD  pantoprazole (PROTONIX) 40 MG tablet TAKE  1 TABLET BY MOUTH DAILY 03/09/17   Joaquim Nam, MD  potassium chloride SA (K-DUR,KLOR-CON) 20 MEQ tablet Take 1 tablet (20 mEq total) by mouth as directed. When you take metolazone 03/18/17   Little Ishikawa, NP  senna-docusate (SENOKOT-S) 8.6-50 MG tablet Take 1 tablet by mouth daily as needed for mild  constipation. 07/28/15   Barrett, Joline Salt, PA-C  traZODone (DESYREL) 50 MG tablet Take 0.5-1 tablets (25-50 mg total) by mouth at bedtime as needed for sleep. Don't take >50mg  in a day. 01/21/16   Joaquim Nam, MD    Family History Family History  Problem Relation Age of Onset  . Breast cancer Mother   . Cancer Mother   . Diabetes Daughter   . Hypertension Daughter   . Cancer Brother     Social History Social History  Substance Use Topics  . Smoking status: Never Smoker  . Smokeless tobacco: Never Used  . Alcohol use No     Allergies   Beta adrenergic blockers and Jardiance [empagliflozin]   Review of Systems Review of Systems  Constitutional: Positive for fatigue. Negative for diaphoresis and unexpected weight change.  HENT: Negative for congestion, sinus pain, sinus pressure and sore throat.   Eyes: Positive for visual disturbance (Blurry vision, more than normal).  Respiratory: Negative for cough, chest tightness and shortness of breath.   Cardiovascular: Positive for leg swelling. Negative for chest pain and palpitations.  Gastrointestinal: Negative for abdominal pain, constipation, diarrhea, nausea and vomiting.  Endocrine: Negative for polydipsia, polyphagia and polyuria.  Genitourinary: Negative for decreased urine volume, difficulty urinating, dysuria and frequency.  Musculoskeletal: Negative for arthralgias, joint swelling and myalgias.  Skin: Negative for color change, rash and wound.  Neurological: Positive for dizziness, facial asymmetry, speech difficulty, weakness, light-headedness and headaches. Negative for tremors, syncope and numbness.     Physical  Exam Updated Vital Signs BP 135/73 (BP Location: Left Arm)   Pulse 71   Temp 97.6 F (36.4 C) (Oral)   Resp 15   SpO2 99%   Physical Exam  Constitutional: Vital signs are normal. She appears well-developed and well-nourished.  HENT:  Head: Normocephalic and atraumatic. Head is without raccoon's eyes and without Battle's sign.  Right Ear: External ear normal.  Left Ear: External ear normal.  Nose: Nose normal.  Mouth/Throat: Uvula is midline and oropharynx is clear and moist. She has dentures. No oropharyngeal exudate.  Eyes: Conjunctivae and EOM are normal. Pupils are equal, round, and reactive to light. No scleral icterus.  Neck: Normal range of motion. Neck supple. No JVD present. No spinous process tenderness and no muscular tenderness present. No tracheal deviation present.  Cardiovascular: Normal rate, normal heart sounds, intact distal pulses and normal pulses.  An irregularly irregular rhythm present.  No murmur heard. Pulmonary/Chest: Effort normal and breath sounds normal. No accessory muscle usage or stridor. No respiratory distress. She has no rales.  Abdominal: Soft. Bowel sounds are normal. She exhibits no distension and no mass. There is no tenderness.  Abdomen is obese  Musculoskeletal: Normal range of motion. She exhibits no edema, tenderness or deformity.  Neurological: She is alert. GCS eye subscore is 4. GCS verbal subscore is 5. GCS motor subscore is 6.  Mental Status:  Alert, oriented, thought content appropriate, able to give a coherent history. Speech fluent without evidence of aphasia, slightly slurred per daughter. Able to follow 2 step commands without difficulty.  Cranial Nerves:  II:  Peripheral visual fields grossly normal, pupils equal, round, reactive to light III,IV, VI: ptosis not present, extra-ocular motions intact bilaterally                V,VII: smile NOT symmetric, mild flattening of left nasolabial fold,  facial light  touch sensation equal  VIII: hearing grossly normal to voice  X: uvula elevates symmetrically  XI: bilateral shoulder shrug symmetric and strong XII: midline tongue extension without fassiculations Motor:  Normal tone. 5/5 in upper and lower extremities bilaterally including strong and equal grip strength and dorsiflexion/plantar flexion Cerebellar: normal finger-to-nose with bilateral upper extremities CV: distal pulses palpable throughout   No pronator drift   Skin: She is not diaphoretic.  Nursing note and vitals reviewed.    ED Treatments / Results  Labs (all labs ordered are listed, but only abnormal results are displayed) Labs Reviewed  PROTIME-INR - Abnormal; Notable for the following:       Result Value   Prothrombin Time 18.7 (*)    All other components within normal limits  CBC - Abnormal; Notable for the following:    Hemoglobin 11.4 (*)    RDW 19.2 (*)    Platelets 103 (*)    All other components within normal limits  COMPREHENSIVE METABOLIC PANEL - Abnormal; Notable for the following:    Glucose, Bld 203 (*)    BUN 27 (*)    Creatinine, Ser 1.79 (*)    Calcium 8.8 (*)    Albumin 2.5 (*)    AST 70 (*)    Alkaline Phosphatase 150 (*)    Total Bilirubin 1.4 (*)    GFR calc non Af Amer 25 (*)    GFR calc Af Amer 29 (*)    All other components within normal limits  BRAIN NATRIURETIC PEPTIDE - Abnormal; Notable for the following:    B Natriuretic Peptide 398.4 (*)    All other components within normal limits  BASIC METABOLIC PANEL - Abnormal; Notable for the following:    Glucose, Bld 172 (*)    BUN 31 (*)    Creatinine, Ser 1.64 (*)    Calcium 8.5 (*)    GFR calc non Af Amer 28 (*)    GFR calc Af Amer 32 (*)    All other components within normal limits  CBC - Abnormal; Notable for the following:    RBC 3.81 (*)    Hemoglobin 10.8 (*)    HCT 35.1 (*)    RDW 19.8 (*)    Platelets 103 (*)    All other components within normal limits  GLUCOSE,  CAPILLARY - Abnormal; Notable for the following:    Glucose-Capillary 167 (*)    All other components within normal limits  GLUCOSE, CAPILLARY - Abnormal; Notable for the following:    Glucose-Capillary 162 (*)    All other components within normal limits  GLUCOSE, CAPILLARY - Abnormal; Notable for the following:    Glucose-Capillary 229 (*)    All other components within normal limits  GLUCOSE, CAPILLARY - Abnormal; Notable for the following:    Glucose-Capillary 213 (*)    All other components within normal limits  GLUCOSE, CAPILLARY - Abnormal; Notable for the following:    Glucose-Capillary 169 (*)    All other components within normal limits  I-STAT CHEM 8, ED - Abnormal; Notable for the following:    BUN 35 (*)    Creatinine, Ser 1.60 (*)    Glucose, Bld 195 (*)    Calcium, Ion 1.09 (*)    All other components within normal limits  ETHANOL  APTT  DIFFERENTIAL  RAPID URINE DRUG SCREEN, HOSP PERFORMED  URINALYSIS, ROUTINE W REFLEX MICROSCOPIC  GLUCOSE, CAPILLARY  MAGNESIUM  BASIC METABOLIC PANEL  I-STAT TROPOININ, ED  EKG  EKG Interpretation  Date/Time:  Thursday March 31 2017 11:23:30 EDT Ventricular Rate:  71 PR Interval:    QRS Duration: 168 QT Interval:  465 QTC Calculation: 506 R Axis:   -71 Text Interpretation:  Atrial-paced rhythm RBBB and LAFB Probable left ventricular hypertrophy Confirmed by Cathren Laine ([id number]) on 03/31/2017 11:50:50 AM       Radiology Ct Head Wo Contrast  Result Date: 03/31/2017 CLINICAL DATA:  Frontal headache.  Several falls over the past week. EXAM: CT HEAD WITHOUT CONTRAST CT CERVICAL SPINE WITHOUT CONTRAST TECHNIQUE: Multidetector CT imaging of the head and cervical spine was performed following the standard protocol without intravenous contrast. Multiplanar CT image reconstructions of the cervical spine were also generated. COMPARISON:  01/08/2012 FINDINGS: CT HEAD FINDINGS Brain: No evidence of acute infarction, hemorrhage,  extra-axial collection, ventriculomegaly, or mass effect. Generalized cerebral atrophy. Periventricular white matter low attenuation likely secondary to microangiopathy. Vascular: Cerebrovascular atherosclerotic calcifications are noted. Skull: Negative for fracture or focal lesion. Sinuses/Orbits: Visualized portions of the orbits are unremarkable. Visualized portions of the paranasal sinuses and mastoid air cells are unremarkable. Other: None. CT CERVICAL SPINE FINDINGS Alignment: Normal. Skull base and vertebrae: No acute fracture. No primary bone lesion or focal pathologic process. Soft tissues and spinal canal: No prevertebral fluid or swelling. No visible canal hematoma. Disc levels: Degenerative disc disease with disc height loss at C3-4, C4-5, C5-6 and C6-7. Mild bilateral facet arthropathy at C2-3. Moderate bilateral facet arthropathy and moderate bilateral foraminal stenosis at C3-4. Moderate bilateral facet arthropathy and severe right foraminal stenosis at C4-5. Moderate bilateral facet arthropathy and foraminal stenosis at C5-6. At C6-7 there is a broad-based disc osteophyte complex with left uncovertebral degenerative changes and moderate left facet arthropathy resulting in foraminal stenosis. Upper chest: Lung apices are clear. Other: No fluid collection or hematoma. Bilateral carotid artery atherosclerosis. IMPRESSION: 1. No acute intracranial pathology. 2. No acute osseous injury of the cervical spine. 3. Cervical spine spondylosis as described above. Electronically Signed   By: Elige Ko   On: 03/31/2017 12:31   Ct Cervical Spine Wo Contrast  Result Date: 03/31/2017 CLINICAL DATA:  Frontal headache.  Several falls over the past week. EXAM: CT HEAD WITHOUT CONTRAST CT CERVICAL SPINE WITHOUT CONTRAST TECHNIQUE: Multidetector CT imaging of the head and cervical spine was performed following the standard protocol without intravenous contrast. Multiplanar CT image reconstructions of the cervical  spine were also generated. COMPARISON:  01/08/2012 FINDINGS: CT HEAD FINDINGS Brain: No evidence of acute infarction, hemorrhage, extra-axial collection, ventriculomegaly, or mass effect. Generalized cerebral atrophy. Periventricular white matter low attenuation likely secondary to microangiopathy. Vascular: Cerebrovascular atherosclerotic calcifications are noted. Skull: Negative for fracture or focal lesion. Sinuses/Orbits: Visualized portions of the orbits are unremarkable. Visualized portions of the paranasal sinuses and mastoid air cells are unremarkable. Other: None. CT CERVICAL SPINE FINDINGS Alignment: Normal. Skull base and vertebrae: No acute fracture. No primary bone lesion or focal pathologic process. Soft tissues and spinal canal: No prevertebral fluid or swelling. No visible canal hematoma. Disc levels: Degenerative disc disease with disc height loss at C3-4, C4-5, C5-6 and C6-7. Mild bilateral facet arthropathy at C2-3. Moderate bilateral facet arthropathy and moderate bilateral foraminal stenosis at C3-4. Moderate bilateral facet arthropathy and severe right foraminal stenosis at C4-5. Moderate bilateral facet arthropathy and foraminal stenosis at C5-6. At C6-7 there is a broad-based disc osteophyte complex with left uncovertebral degenerative changes and moderate left facet arthropathy resulting in foraminal stenosis. Upper chest: Lung apices are  clear. Other: No fluid collection or hematoma. Bilateral carotid artery atherosclerosis. IMPRESSION: 1. No acute intracranial pathology. 2. No acute osseous injury of the cervical spine. 3. Cervical spine spondylosis as described above. Electronically Signed   By: Elige Ko   On: 03/31/2017 12:31   Mr Brain Wo Contrast (neuro Protocol)  Result Date: 03/31/2017 CLINICAL DATA:  Facial droop and slurred speech. History of memory loss, atrial fibrillation, dyslipidemia, pacemaker. EXAM: MRI HEAD WITHOUT CONTRAST TECHNIQUE: Multiplanar, multiecho pulse  sequences of the brain and surrounding structures were obtained without intravenous contrast. COMPARISON:  CT HEAD March 31, 2017 at 1218 hours FINDINGS: BRAIN: No reduced diffusion to suggest acute ischemia. No susceptibility artifact to suggest hemorrhage. The ventricles and sulci are normal for patient's age. Patchy supratentorial white matter FLAIR T2 hyperintensities. Old tiny cerebellar infarcts. Old RIGHT thalamus lacunar infarct. Old RIGHT basal ganglia lacunar infarct. No suspicious parenchymal signal, masses or mass effect. No abnormal extra-axial fluid collections. VASCULAR: Normal major intracranial vascular flow voids present at skull base. SKULL AND UPPER CERVICAL SPINE: No abnormal sellar expansion. No suspicious calvarial bone marrow signal. Craniocervical junction maintained. Moderate degenerative change of the included cervical spine. SINUSES/ORBITS: Trace paranasal sinus mucosal thickening without air-fluid levels. Mastoid air cells are well aerated. The included ocular globes and orbital contents are non-suspicious. Status post bilateral ocular lens implants. OTHER: Patient is edentulous. IMPRESSION: No acute intracranial process. Mild to moderate chronic small vessel ischemic disease. Old RIGHT basal ganglia and thalamus lacunar infarcts and, old tiny cerebellar infarcts. Electronically Signed   By: Awilda Metro M.D.   On: 03/31/2017 20:49   Dg Chest Port 1 View  Result Date: 03/31/2017 CLINICAL DATA:  Onset of slurred speech at 8 a.m. today. History of CHF, COPD, coronary artery disease EXAM: PORTABLE CHEST 1 VIEW COMPARISON:  PA and lateral chest x-ray of March 17, 2017 FINDINGS: The lungs are adequately inflated and clear. The cardiac silhouette is enlarged. The pulmonary vascularity is not engorged. The mediastinum is normal in width. The ICD is in stable position. There degenerative changes of the shoulders. IMPRESSION: Cardiomegaly. No pulmonary edema, pneumonia, nor other acute  cardiopulmonary abnormality. Electronically Signed   By: David  [place] M.D.   On: 03/31/2017 12:16    Procedures Procedures (including critical care time)  Medications Ordered in ED Medications - No data to display   Initial Impression / Assessment and Plan / ED Course  I have reviewed the triage vital signs and the nursing notes.  Pertinent labs & imaging results that were available during my care of the patient were reviewed by me and considered in my medical decision making (see chart for details).    [name] presents with weakness, slurred speech and facial droop with a last seen normal time of 2200 last night.  Based on unknown onset a stroke alert was not called after consultation with Dr. Denton Lank.  While in the ED her symptoms continued to improve and by time of admission patient was back to her normal baseline according to daughter.  CT obtained of head and neck, based on symptoms and multiple falls, both with out acute abnormality. MRI with out acute abnormality. No obvious cause for symptoms found on labs BNP ordered based on reported edema, was mildly elevated. Based on history and concern for TIA patient will be admitted for continued TIA work up as symptoms continued improving while in the ED.  Dr. Patria Mane spoke with hospitalist and neurology about admission, hospitalist will admit.  The patient appears reasonably stabilized for admission considering the current resources, flow, and capabilities available in the ED at this time, and I doubt any other [facility] requiring further screening and/or treatment in the ED prior to admission.   .   Final Clinical Impressions(s) / ED Diagnoses   Final diagnoses:  Transient cerebral ischemia, unspecified type    New Prescriptions New Prescriptions   No medications on file     Norman Clay 04/01/17 2046    Azalia Bilis, MD 04/04/17 [phone]

## 2017-03-31 NOTE — ED Notes (Signed)
Xray at the bedside.

## 2017-03-31 NOTE — Telephone Encounter (Signed)
Patient's daughter calling CHF clinic to report patient waking up with dizziness, shakiness, swelling, and not feeling well. Weight has not changed in the last 1-2 weeks since we last advised to take extra diuretics (still 196 lbs, usually 189-191 lbs), however, the dizziness and shakiness is similar to s/s she has had before with PAF. Advised to come in for EKG to confirm rhythm. Patient's daughter asking if they can go to PCP ([facility] at [facility]) near home where daughter works to have EKG done. Will fax order to their office.  Advised daughter to return our call once EKG completed. Also advised patient may take prn metolazone as they have not taken it in 2 weeks to see if this helps with swelling. Patient has apt with Dr. Shirlee Latch tomorrow, will forward to him to update.  Ave Filter, RN

## 2017-03-31 NOTE — ED Notes (Signed)
Pt. Transported to CT 

## 2017-03-31 NOTE — ED Notes (Signed)
This RN accompanied patient to MRI. This RN will remain with patient throughout the scan due to patient having implanted pacemaker.

## 2017-03-31 NOTE — ED Notes (Signed)
Patient placed on cardiac monitor and 2L nasal cannula that is MRI safe by the radiology staff.  Patient had pacemaker placed in safe mode.  Patient is A&Ox4 with no neuro changed prior to MRI.  Will assess again at the completion of exam.

## 2017-04-01 ENCOUNTER — Encounter (HOSPITAL_COMMUNITY): Payer: Medicare Other

## 2017-04-01 ENCOUNTER — Other Ambulatory Visit (HOSPITAL_COMMUNITY): Payer: Medicare Other

## 2017-04-01 DIAGNOSIS — R531 Weakness: Secondary | ICD-10-CM | POA: Diagnosis not present

## 2017-04-01 LAB — CBC
HCT: 35.1 % — ABNORMAL LOW (ref 36.0–46.0)
Hemoglobin: 10.8 g/dL — ABNORMAL LOW (ref 12.0–15.0)
MCH: 28.3 pg (ref 26.0–34.0)
MCHC: 30.8 g/dL (ref 30.0–36.0)
MCV: 92.1 fL (ref 78.0–100.0)
PLATELETS: 103 10*3/uL — AB (ref 150–400)
RBC: 3.81 MIL/uL — AB (ref 3.87–5.11)
RDW: 19.8 % — AB (ref 11.5–15.5)
WBC: 9.1 10*3/uL (ref 4.0–10.5)

## 2017-04-01 LAB — GLUCOSE, CAPILLARY
GLUCOSE-CAPILLARY: 167 mg/dL — AB (ref 65–99)
GLUCOSE-CAPILLARY: 213 mg/dL — AB (ref 65–99)
GLUCOSE-CAPILLARY: 169 mg/dL — AB (ref 65–99)
Glucose-Capillary: 162 mg/dL — ABNORMAL HIGH (ref 65–99)
Glucose-Capillary: 229 mg/dL — ABNORMAL HIGH (ref 65–99)

## 2017-04-01 LAB — BASIC METABOLIC PANEL
Anion gap: 9 (ref 5–15)
BUN: 31 mg/dL — AB (ref 6–20)
CO2: 27 mmol/L (ref 22–32)
Calcium: 8.5 mg/dL — ABNORMAL LOW (ref 8.9–10.3)
Chloride: 105 mmol/L (ref 101–111)
Creatinine, Ser: 1.64 mg/dL — ABNORMAL HIGH (ref 0.44–1.00)
GFR calc Af Amer: 32 mL/min — ABNORMAL LOW (ref >60)
GFR, EST NON AFRICAN AMERICAN: 28 mL/min — AB (ref >60)
GLUCOSE: 172 mg/dL — AB (ref 65–99)
POTASSIUM: 4.3 mmol/L (ref 3.5–5.1)
SODIUM: 141 mmol/L (ref 135–145)

## 2017-04-01 LAB — MAGNESIUM: Magnesium: 2 mg/dL (ref 1.7–2.4)

## 2017-04-01 MED ORDER — SODIUM CHLORIDE 0.9% FLUSH
3.0000 mL | Freq: Two times a day (BID) | INTRAVENOUS | Status: DC
  Administered 2017-04-01 – 2017-04-04 (×9): 3 mL via INTRAVENOUS

## 2017-04-01 MED ORDER — METOPROLOL SUCCINATE ER 50 MG PO TB24
50.0000 mg | ORAL_TABLET | Freq: Two times a day (BID) | ORAL | Status: DC
  Administered 2017-04-01 – 2017-04-02 (×4): 50 mg via ORAL
  Filled 2017-04-01 (×4): qty 1

## 2017-04-01 MED ORDER — ACETAMINOPHEN 325 MG PO TABS: 650.0000 mg | ORAL_TABLET | Freq: Four times a day (QID) | ORAL | Status: DC | PRN

## 2017-04-01 MED ORDER — APIXABAN 2.5 MG PO TABS
2.5000 mg | ORAL_TABLET | Freq: Two times a day (BID) | ORAL | Status: DC
  Administered 2017-04-01 – 2017-04-05 (×9): 2.5 mg via ORAL
  Filled 2017-04-01 (×9): qty 1

## 2017-04-01 MED ORDER — ATORVASTATIN CALCIUM 20 MG PO TABS
20.0000 mg | ORAL_TABLET | Freq: Every day | ORAL | Status: DC
  Administered 2017-04-01 – 2017-04-05 (×5): 20 mg via ORAL
  Filled 2017-04-01 (×5): qty 1

## 2017-04-01 MED ORDER — DULOXETINE HCL 20 MG PO CPEP
20.0000 mg | ORAL_CAPSULE | Freq: Every day | ORAL | Status: DC
  Administered 2017-04-01 – 2017-04-04 (×5): 20 mg via ORAL
  Filled 2017-04-01 (×6): qty 1

## 2017-04-01 MED ORDER — FUROSEMIDE 10 MG/ML IJ SOLN
80.0000 mg | Freq: Every day | INTRAMUSCULAR | Status: DC
  Administered 2017-04-01 – 2017-04-02 (×2): 80 mg via INTRAVENOUS
  Filled 2017-04-01 (×2): qty 8

## 2017-04-01 MED ORDER — ALLOPURINOL 300 MG PO TABS
300.0000 mg | ORAL_TABLET | Freq: Every day | ORAL | Status: DC
  Administered 2017-04-01 – 2017-04-05 (×5): 300 mg via ORAL
  Filled 2017-04-01 (×5): qty 1

## 2017-04-01 MED ORDER — PANTOPRAZOLE SODIUM 40 MG PO TBEC
40.0000 mg | DELAYED_RELEASE_TABLET | Freq: Every day | ORAL | Status: DC
  Administered 2017-04-01 – 2017-04-05 (×5): 40 mg via ORAL
  Filled 2017-04-01 (×5): qty 1

## 2017-04-01 MED ORDER — AMIODARONE HCL 200 MG PO TABS
200.0000 mg | ORAL_TABLET | Freq: Every day | ORAL | Status: DC
  Administered 2017-04-01 – 2017-04-05 (×5): 200 mg via ORAL
  Filled 2017-04-01 (×5): qty 1

## 2017-04-01 MED ORDER — INSULIN ASPART 100 UNIT/ML ~~LOC~~ SOLN
0.0000 [IU] | SUBCUTANEOUS | Status: DC
  Administered 2017-04-01 (×2): 3 [IU] via SUBCUTANEOUS
  Administered 2017-04-01 (×2): 5 [IU] via SUBCUTANEOUS
  Administered 2017-04-01: 3 [IU] via SUBCUTANEOUS
  Administered 2017-04-02: 8 [IU] via SUBCUTANEOUS
  Administered 2017-04-02: 5 [IU] via SUBCUTANEOUS
  Administered 2017-04-02 (×2): 2 [IU] via SUBCUTANEOUS
  Administered 2017-04-02: 15 [IU] via SUBCUTANEOUS
  Administered 2017-04-03: 2 [IU] via SUBCUTANEOUS
  Administered 2017-04-03: 3 [IU] via SUBCUTANEOUS
  Administered 2017-04-03: 5 [IU] via SUBCUTANEOUS
  Administered 2017-04-03: 11 [IU] via SUBCUTANEOUS
  Administered 2017-04-03: 8 [IU] via SUBCUTANEOUS
  Administered 2017-04-04: 5 [IU] via SUBCUTANEOUS
  Administered 2017-04-04: 8 [IU] via SUBCUTANEOUS
  Administered 2017-04-04: 2 [IU] via SUBCUTANEOUS
  Administered 2017-04-04: 5 [IU] via SUBCUTANEOUS
  Administered 2017-04-05: 8 [IU] via SUBCUTANEOUS
  Administered 2017-04-05: 3 [IU] via SUBCUTANEOUS
  Administered 2017-04-05: 5 [IU] via SUBCUTANEOUS
  Administered 2017-04-05: 8 [IU] via SUBCUTANEOUS

## 2017-04-01 MED ORDER — INSULIN GLARGINE 100 UNIT/ML ~~LOC~~ SOLN
10.0000 [IU] | Freq: Every day | SUBCUTANEOUS | Status: DC
  Administered 2017-04-01 – 2017-04-04 (×5): 10 [IU] via SUBCUTANEOUS
  Filled 2017-04-01 (×6): qty 0.1

## 2017-04-01 MED ORDER — ACETAMINOPHEN 650 MG RE SUPP: 650.0000 mg | Freq: Four times a day (QID) | RECTAL | Status: DC | PRN

## 2017-04-01 MED ORDER — ISOSORBIDE MONONITRATE ER 30 MG PO TB24
30.0000 mg | ORAL_TABLET | Freq: Every day | ORAL | Status: DC
  Administered 2017-04-01 – 2017-04-02 (×2): 30 mg via ORAL
  Filled 2017-04-01 (×2): qty 1

## 2017-04-01 MED ORDER — POTASSIUM CHLORIDE CRYS ER 20 MEQ PO TBCR
40.0000 meq | EXTENDED_RELEASE_TABLET | Freq: Two times a day (BID) | ORAL | Status: DC
  Administered 2017-04-01 – 2017-04-02 (×5): 40 meq via ORAL
  Filled 2017-04-01 (×5): qty 2

## 2017-04-01 MED ORDER — ORAL CARE MOUTH RINSE
15.0000 mL | Freq: Two times a day (BID) | OROMUCOSAL | Status: DC
  Administered 2017-04-01 – 2017-04-05 (×5): 15 mL via OROMUCOSAL

## 2017-04-01 NOTE — Progress Notes (Signed)
Patient with no complaints or concerns for rest of the night.  Will continue to monitor.   Wilmer Santillo, RN

## 2017-04-01 NOTE — Evaluation (Addendum)
Physical Therapy Evaluation Patient Details Name: Danielle Benitez MRN: [id number] DOB: Jun 18, 1932 Today's Date: 04/01/2017   History of Present Illness  Pt is a 83 yo femalw with complants of weakness, SoB, LE swelling, and diaphoresis secondary to diastolic CHF. PMH significant for HTN, iron-def anemia, bradycardia with pacer, Afib on Eliquis, HFpEF and CKD III   Clinical Impression  Pt admitted with above diagnosis. Pt currently with functional limitations due to the deficits listed below (see PT Problem List). Pt is minA for bed mobility, min guard for transfers and minA for ambulation of 40 feet with RW. Pt will benefit from skilled PT to increase their independence and safety with mobility to allow discharge to the venue listed below.       Follow Up Recommendations Home health PT;Supervision/Assistance - 24 hour    Equipment Recommendations  None recommended by PT       Precautions / Restrictions Precautions Precautions: Fall Restrictions Weight Bearing Restrictions: No      Mobility  Bed Mobility Overal bed mobility: Needs Assistance Bed Mobility: Supine to Sit     Supine to sit: Min assist     General bed mobility comments: minA for trunk to upright  Transfers Overall transfer level: Needs assistance   Transfers: Sit to/from Stand Sit to Stand: Min guard         General transfer comment: min guard for safety, slow ascent to upright and decreased eccentric control in descent to recliner  Ambulation/Gait Ambulation/Gait assistance: Min assist Ambulation Distance (Feet): 40 Feet Assistive device: Rolling walker (2 wheeled) Gait Pattern/deviations: Step-through pattern;Decreased step length - right;Decreased step length - left;Antalgic;Trunk flexed;Decreased weight shift to right Gait velocity: slowed Gait velocity interpretation: Below normal speed for age/gender General Gait Details: minA for steadying verbal and tactile cues for staying within walker and  upright posture     Balance Overall balance assessment: Needs assistance Sitting-balance support: No upper extremity supported;Feet supported Sitting balance-Leahy Scale: Good Sitting balance - Comments: able to maintain balance while moving bed back   Standing balance support: Bilateral upper extremity supported Standing balance-Leahy Scale: Fair Standing balance comment: requires RW for balance                             Pertinent Vitals/Pain Pain Assessment: 0-10 Pain Score: 5  Pain Location: R knee  Pain Descriptors / Indicators: Constant;Aching Pain Intervention(s): Monitored during session;Limited activity within patient's tolerance;Repositioned    Home Living Family/patient expects to be discharged to:: Private residence Living Arrangements: Children Available Help at Discharge: Family;Available 24 hours/day Type of Home: Apartment Home Access: Level entry     Home Layout: One level Home Equipment: Walker - 2 wheels;Cane - quad;Walker - 4 wheels;Shower seat      Prior Function Level of Independence: Needs assistance   Gait / Transfers Assistance Needed: RW for ambulation   ADL's / Homemaking Assistance Needed: assist with bathing           Extremity/Trunk Assessment   Upper Extremity Assessment Upper Extremity Assessment: Generalized weakness    Lower Extremity Assessment Lower Extremity Assessment: RLE deficits/detail;Generalized weakness RLE Deficits / Details: R knee ROM and strength limited by OA    Cervical / Trunk Assessment Cervical / Trunk Assessment: Kyphotic  Communication   Communication: No difficulties  Cognition Arousal/Alertness: Awake/alert Behavior During Therapy: WFL for tasks assessed/performed Overall Cognitive Status: Within Functional Limits for tasks assessed  General Comments General comments (skin integrity, edema, etc.): at entry pt on 2 L O2 via nasal  cannula and SaO2 89% O2, O2 removed for activity and pt maintained SaO2 >90%O2 throughout session. HR at rest 68bpm with activity 89 bpm Daughter present during session         Assessment/Plan    PT Assessment Patient needs continued PT services  PT Problem List Decreased strength;Decreased range of motion;Decreased activity tolerance;Decreased balance;Decreased mobility;Decreased knowledge of use of DME;Decreased safety awareness;Decreased knowledge of precautions;Pain       PT Treatment Interventions DME instruction;Gait training;Functional mobility training;Therapeutic activities;Therapeutic exercise;Balance training;Patient/family education    PT Goals (Current goals can be found in the Care Plan section)  Acute Rehab PT Goals Patient Stated Goal: go back to church PT Goal Formulation: With patient Time For Goal Achievement: 04/15/17 Potential to Achieve Goals: Fair    Frequency Min 3X/week    AM-PAC PT "6 Clicks" Daily Activity  Outcome Measure Difficulty turning over in bed (including adjusting bedclothes, sheets and blankets)?: A Lot Difficulty moving from lying on back to sitting on the side of the bed? : Total Difficulty sitting down on and standing up from a chair with arms (e.g., wheelchair, bedside commode, etc,.)?: A Lot Help needed moving to and from a bed to chair (including a wheelchair)?: A Little Help needed walking in hospital room?: A Little Help needed climbing 3-5 steps with a railing? : Total 6 Click Score: 12    End of Session Equipment Utilized During Treatment: Gait belt Activity Tolerance: Patient limited by fatigue;Patient limited by pain (limited by R knee pain) Patient left: in chair;with call bell/phone within reach Nurse Communication: Mobility status;Other (comment) (O2 levels with ambulation) PT Visit Diagnosis: Unsteadiness on feet (R26.81);Other abnormalities of gait and mobility (R26.89);Muscle weakness (generalized) (M62.81);Difficulty in  walking, not elsewhere classified (R26.2);Pain Pain - Right/Left: Right Pain - part of body: Knee    Time: 0811-0848 PT Time Calculation (min) (ACUTE ONLY): 37 min   Charges:   PT Evaluation $PT Eval Moderate Complexity: 1 Procedure PT Treatments $Gait Training: 8-22 mins   PT G Codes:   PT G-Codes **NOT FOR INPATIENT CLASS** Functional Assessment Tool Used: AM-PAC 6 Clicks Basic Mobility Functional Limitation: Mobility: Walking and moving around Mobility: Walking and Moving Around Current Status (Z6109): At least 60 percent but less than 80 percent impaired, limited or restricted Mobility: Walking and Moving Around Goal Status [phone]): At least 20 percent but less than 40 percent impaired, limited or restricted    Lanora Manis B. Beverely Risen PT, DPT Acute Rehabilitation  [phone] Pager [phone]    Elon Alas Fleet 04/01/2017, 9:11 AM

## 2017-04-01 NOTE — Progress Notes (Signed)
New Admission Note:   Arrival Method: Bed Mental Orientation: Alert and oriented x4 Telemetry:3ebox 5 Assessment: Completed  Pain:0/10 Tubes: None Safety Measures: Safety Fall Prevention Plan has been discussed  Admission:  [facility] Orientation: Patient has been orientated to the room, unit and staff.  Family: none at bedside  Orders to be reviewed and implemented. Will continue to monitor the patient. Call light has been placed within reach and bed alarm has been activated.   Bettey Mare, RN Phone: [phone]

## 2017-04-01 NOTE — H&P (Signed)
History and Physical  Patient Name: Danielle Benitez     ZOX:[id number]    DOB: Sep 26, 1932    DOA: 03/31/2017 PCP: Joaquim Nam, MD  Patient coming from: Home  Chief Complaint: Weakness, slurred speech, swelling      HPI: TENEA SENS is a 83 y.o. female with a past medical history significant for HTN, iron-def anemia, bradycardia with pacer, Afib on Eliquis, HFpEF and CKD III who presents with disparate complaints though mainly swelling for a few days and 5lb weight gain despite increased diuretics and then this morning generalized weakness and slurred speech.  The patient and her daughter together provide history.  They relate that over the last week or more, the patient has appeared swollen at times (daughter notes this in face), and has felt SOB and felt abdomen swelling.  They have discussed with PCP and Cardiology and increased her lasix and then taken metolazone, but despite this, yesterday and today her weight was up to 196 lbs on home scale (from baseline 189-191).    In addition, this morning the patient woke up "sweaty" and "feeling hot" and was very weak, so much so that she couldn't sit up and family couldn't lift her.  She was very groggy and family noticed that her speech was slurred.  She had no fever, cough, sputum production, dysuria, or foul urine.  She had no focal weakness, facial droop, numbness.    ED course: -Afebrile, heart rate 71, respirations and pulse ox normal, BP 124/80 -Na 137, K 4.2, Cr 1.79 (baseline 1.5-1.8), WBC 8K, Hgb 11.4 -INR 1.5, PTT normal -Albumin 2.5 -Troponin negative -UDS negative -Alcohol negative -UA with pyuria or hematuria -BNP 398 -CXR without edema -CT head and MR brain without infarct -ECG showed a normal paced rhythm -Initially TIA or stroke was suspected, but patient continued to have intermittently perceived slurred speech in ER even after MR brain      ROS: Review of Systems  Constitutional: Positive for diaphoresis. Negative  for chills, fever and malaise/fatigue.  Respiratory: Positive for shortness of breath. Negative for cough, sputum production and wheezing.   Cardiovascular: Positive for leg swelling. Negative for chest pain, palpitations, orthopnea and PND.  Gastrointestinal: Negative for abdominal pain, nausea and vomiting.  Genitourinary: Negative for dysuria, frequency, hematuria and urgency.  Neurological: Positive for speech change, weakness and headaches. Negative for dizziness, sensory change, focal weakness, seizures and loss of consciousness.  All other systems reviewed and are negative.         Past Medical History:  Diagnosis Date  . Abnormality of gait   . Anemia, unspecified   . Anticoagulated- Eliquis 07/31/2015  . Anxiety state, unspecified   . Aortic stenosis    a. Mild by echo 08/2013.  Marland Kitchen Atrial fibrillation - developed 07/11/15 by PPM interrogation, recurrent after DCCV 10/14 07/11/2015    recurrent after DCCV 10/14  . Bifascicular block    afib  . Chronic systolic CHF (congestive heart failure) (HCC)    a. Dx 07/2015 - EF 35-40%, unclear etiology  . CKD (chronic kidney disease), stage III   . Depressive disorder, not elsewhere classified   . DM (diabetes mellitus), type 2, uncontrolled, with renal complications (HCC) 01/08/2012  . Eczema 11/22/2013  . Esophageal dysmotility 08/05/2015  . Essential hypertension   . Female stress incontinence 05/06/2015  . Full dentures   . GERD (gastroesophageal reflux disease)   . Gout   . HOH (hard of hearing)   . Internal hemorrhoids without  mention of complication   . Lumbago   . Memory loss   . Mild CAD    a. Cath 2013: 20-30% prox RCA.  Marland Kitchen Myocardial infarction (HCC) 07/2015  . Nonspecific abnormal results of liver function study   . Obesity, unspecified   . Orthostatic hypotension   . Osteoarthritis   . Other and unspecified hyperlipidemia   . Pain in joint, shoulder region 11/22/2013   left   . Personal history of fall   .  Presence of permanent cardiac pacemaker   . Pruritus 11/22/2013  . S/P cardiac pacemaker procedure, PPM [employer] REVO placed 01/11/2012 01/11/2012   a. for symptomatic bradycardia. Followed by Dr. Ladona Ridgel.  . Shortness of breath dyspnea    with movement-  . Wears glasses     Past Surgical History:  Procedure Laterality Date  . CARDIAC CATHETERIZATION  12/03/2003   Dr Clarene Duke  . CARDIOVERSION N/A 07/25/2015   Procedure: CARDIOVERSION;  Surgeon: Pricilla Riffle, MD;  Location: [facility] ENDOSCOPY;  Service: Cardiovascular;  Laterality: N/A;  . CARDIOVERSION N/A 08/06/2015   Procedure: CARDIOVERSION;  Surgeon: Laurey Morale, MD;  Location: [facility] ENDOSCOPY;  Service: Cardiovascular;  Laterality: N/A;  . CATARACT EXTRACTION W/ INTRAOCULAR LENS  IMPLANT, BILATERAL Bilateral 03/2015-04/2015  . COLONOSCOPY     ?????  . EP IMPLANTABLE DEVICE N/A 04/15/2016   Procedure: Pacemaker Implant;  Surgeon: Marinus Maw, MD;  Location: [facility] INVASIVE CV LAB;  Service: Cardiovascular;  Laterality: N/A;  . ESOPHAGOGASTRODUODENOSCOPY N/A 08/08/2015   Procedure: ESOPHAGOGASTRODUODENOSCOPY (EGD);  Surgeon: Iva Boop, MD;  Location: [facility] ENDOSCOPY;  Service: Endoscopy;  Laterality: N/A;  . EXTREMITY CYST EXCISION     finger  . EYE SURGERY Bilateral    catartact  . INSERT / REPLACE / REMOVE PACEMAKER    . LEFT HEART CATHETERIZATION WITH CORONARY ANGIOGRAM N/A 01/10/2012   Procedure: LEFT HEART CATHETERIZATION WITH CORONARY ANGIOGRAM;  Surgeon: Chrystie Nose, MD;  Location: [facility] CATH LAB;  Service: Cardiovascular;  Laterality: N/A;  . ORIF WRIST FRACTURE Right 04/09/2014   Procedure: OPEN REDUCTION INTERNAL FIXATION (ORIF) RIGHT DISPLACED  DISTAL RADIUS  FRACTURE;  Surgeon: Dominica Severin, MD;  Location: [facility];  Service: Orthopedics;  Laterality: Right;  . PACEMAKER LEAD REMOVAL  04/12/2016   TEMPORARY PACEMAKER INSERTION  . PACEMAKER LEAD REMOVAL  04/15/2016   Procedure: Pacemaker Lead Removal;  Surgeon: Marinus Maw, MD;  Location: [facility] INVASIVE CV LAB;  Service: Cardiovascular;;  . PACEMAKER LEAD REMOVAL N/A 04/12/2016   Procedure: PACEMAKER LEAD REMOVAL;  Surgeon: Marinus Maw, MD;  Location: [facility] OR;  Service: Cardiovascular;  Laterality: N/A;  . PERMANENT PACEMAKER INSERTION Left 01/11/2012   Procedure: PERMANENT PACEMAKER INSERTION;  Surgeon: Thurmon Fair, MD;  Location: [facility] CATH LAB;  Service: Cardiovascular;  Laterality: Left;  . TEE WITHOUT CARDIOVERSION N/A 07/25/2015   Procedure: TRANSESOPHAGEAL ECHOCARDIOGRAM (TEE);  Surgeon: Pricilla Riffle, MD;  Location: [facility] ENDOSCOPY;  Service: Cardiovascular;  Laterality: N/A;    Social History: Patient lives with her daughter.  The patient walks with a walker, very sedentary.  Nonsmoker.  From [place].  Allergies  Allergen Reactions  . Beta Adrenergic Blockers Other (See Comments)    Couldn't speak or hear Metoprolol seems ok  . Jardiance [Empagliflozin] Other (See Comments)    Rash, kidney issues    Family history: family history includes Breast cancer in her mother; Cancer in her brother and mother; Diabetes in her daughter; Hypertension in her daughter.  Prior  to Admission medications   Medication Sig Start Date End Date Taking? Authorizing Provider  albuterol (PROVENTIL HFA;VENTOLIN HFA) 108 (90 Base) MCG/ACT inhaler Inhale 2 puffs into the lungs every 4 (four) hours as needed for wheezing or shortness of breath (cough, shortness of breath or wheezing.). 10/15/16  Yes Emi Belfast, FNP  allopurinol (ZYLOPRIM) 300 MG tablet TAKE 1 TABLET BY MOUTH DAILY 11/24/16  Yes Joaquim Nam, MD  amiodarone (PACERONE) 200 MG tablet TAKE 1 TABLET BY MOUTH DAILY 02/14/17  Yes Laurey Morale, MD  atorvastatin (LIPITOR) 40 MG tablet TAKE ONE-HALF (0.5) TABLET BY MOUTH ONCEDAILY AT 6PM Patient taking differently: TAKE ONE-HALF (0.5) TABLET=20MG  BY MOUTH ONCEDAILY AT 6PM 12/27/16  Yes Joaquim Nam, MD  DULoxetine (CYMBALTA) 20 MG capsule TAKE ONE CAPSULE  BY MOUTH EVERY NIGHT AT BEDTIME 01/06/17  Yes Joaquim Nam, MD  ELIQUIS 2.5 MG TABS tablet TAKE 1 TABLET BY MOUTH TWICE A DAY 02/14/17  Yes Bensimhon, Bevelyn Buckles, MD  fluticasone (FLONASE) 50 MCG/ACT nasal spray Place 1 spray into both nostrils daily as needed for allergies or rhinitis.   Yes [provider]  furosemide (LASIX) 40 MG tablet Take 1 tablet (40 mg total) by mouth 2 (two) times daily. 10/25/16  Yes Laurey Morale, MD  GAS RELIEF 80 MG chewable tablet CHEW ONE TABLET BY MOUTH EVERY 6 HOURS AS NEEDED FOR GAS. 11/06/16  Yes Joaquim Nam, MD  glimepiride (AMARYL) 1 MG tablet TAKE 1 TABLET BY MOUTH DAILY WITH BREAKFAST 10/08/16  Yes Joaquim Nam, MD  Insulin Glargine (LANTUS SOLOSTAR) 100 UNIT/ML Solostar Pen Inject 5 Units into the skin daily at 10 pm. Patient taking differently: Inject 21 Units into the skin daily at 10 pm. If CBG is greater than 150 add 1 unit daily  If less than 100 subtract 1 unit from previous day If 100-150 then no change 01/27/17  Yes Joaquim Nam, MD  Insulin Pen Needle (PEN NEEDLES) 30G X 5 MM MISC Inject 1 Dose as directed daily. Use with insulin pen 01/27/17  Yes Joaquim Nam, MD  isosorbide mononitrate (IMDUR) 30 MG 24 hr tablet Take 1 tablet (30 mg total) by mouth daily. 03/10/17  Yes Bensimhon, Bevelyn Buckles, MD  loratadine (CLARITIN CHILDRENS) 5 MG chewable tablet Chew 1 tablet (5 mg total) by mouth daily. 01/26/17  Yes Joaquim Nam, MD  metolazone (ZAROXOLYN) 2.5 MG tablet Take 1 tablet (2.5 mg total) by mouth as directed. Patient taking differently: Take 2.5 mg by mouth daily as needed (fluid).  03/18/17 06/16/17 Yes Little Ishikawa, NP  metoprolol succinate (TOPROL-XL) 50 MG 24 hr tablet Take 1 tablet (50 mg total) by mouth 2 (two) times daily. Take with or immediately following a meal. 02/21/17  Yes Clegg, Amy D, NP  Multiple Vitamins-Minerals (ONE-A-DAY 50 PLUS PO) Take 1 capsule by mouth daily.   Yes [provider]  ONE TOUCH ULTRA  TEST test strip USE TO CHECK BLOOD SUGAR ONCE A DAY AS DIRECTED 08/10/16  Yes Joaquim Nam, MD  ONGLYZA 2.5 MG TABS tablet TAKE 1 TABLET BY MOUTH DAILY 02/02/17  Yes Joaquim Nam, MD  pantoprazole (PROTONIX) 40 MG tablet TAKE 1 TABLET BY MOUTH DAILY 03/09/17  Yes Joaquim Nam, MD  potassium chloride SA (K-DUR,KLOR-CON) 20 MEQ tablet Take 1 tablet (20 mEq total) by mouth as directed. When you take metolazone Patient taking differently: Take 20 mEq by mouth daily as needed (supplement). When you  take metolazone 03/18/17  Yes Suzzette Righter E, NP  senna-docusate (SENOKOT-S) 8.6-50 MG tablet Take 1 tablet by mouth daily as needed for mild constipation. 07/28/15  Yes Barrett, Joline Salt, PA-C  traZODone (DESYREL) 50 MG tablet Take 0.5-1 tablets (25-50 mg total) by mouth at bedtime as needed for sleep. Don't take >50mg  in a day. 01/21/16  Yes Joaquim Nam, MD  acetaminophen (TYLENOL) 325 MG tablet Take 1-2 tablets (325-650 mg total) by mouth 3 (three) times daily as needed. Patient not taking: Reported on 03/31/2017 04/05/16   Joaquim Nam, MD       Physical Exam: BP 128/60 (BP Location: Left Arm)   Pulse 75   Temp 97.8 F (36.6 C) (Oral)   Resp 15   Ht 5' (1.524 m)   Wt 87.7 kg (193 lb 4.8 oz)   SpO2 100%   BMI 37.75 kg/m  General appearance: Elderly obese adult female, alert and in no acute distress, appears very tired, but oriented, alert.   Eyes: Anicteric, conjunctiva pink, lids and lashes normal. PERRL.    ENT: No nasal deformity, discharge, epistaxis.  Hearing normal. OP dry without lesions.   Neck: No neck masses.  Trachea midline.  No thyromegaly/tenderness. Lymph: No cervical or supraclavicular lymphadenopathy. Skin: Warm and dry.  No jaundice.  No suspicious rashes or lesions. Cardiac: RRR, nl S1-S2, no murmurs appreciated.  Capillary refill is brisk.  JVP not visible.  1+ LE edema.  Radial and DP pulses 2+ and symmetric. Respiratory: Tachypneic, sounds breathless.  CTAB  without rales or wheezes. Abdomen: Abdomen soft.  No TTP. No ascites, distension.   MSK: No deformities or effusions.  No cyanosis or clubbing. Neuro: Pupils are 3 mm and reactive to 2 mm.  Extraocular movements are intact, without nystagmus.  Cranial nerve 5 is within normal limits.  Cranial nerve 7 is symmetrical.  Cranial nerve 8 is within normal limits.  Cranial nerves 9 and 10 reveal equal palate elevation.  Cranial nerve 11 reveals sternocleidomastoid strong.  Cranial nerve 12 is midline.  Motor strength testing is 5-/5 (somewhat globally weak but able to sit self up) in the upper and lower extremities bilaterally with normal motor, tone and bulk for age and level of deconditioning.  Sensory examination is intact to light touch.  No pronator drift.  Finger-to-nose testing is symmetrically awkward. The patient is oriented to time, place and person.  Speech is fluent, sometimes sounds hoarse or weak because she is tired, identified by both daughter and son at bedside as slurring from before.  Naming is grossly intact.  Recall, recent and remote, as well as general fund of knowledge seem within normal limits.  Attention span and concentration are within normal limits.   Psych: Sensorium intact and responding to questions, attention normal.  Behavior appropriate.  Affect pleasant.  Judgment and insight appear normal.     Labs on Admission:  I have personally reviewed following labs and imaging studies: CBC:  Recent Labs Lab 03/31/17 1132 03/31/17 1218  WBC 8.8  --   NEUTROABS 6.9  --   HGB 11.4* 13.3  HCT 36.9 39.0  MCV 90.4  --   PLT 103*  --    Basic Metabolic Panel:  Recent Labs Lab 03/31/17 0018 03/31/17 1132 03/31/17 1218  NA  --  137 140  K  --  4.2 4.2  CL  --  101 101  CO2  --  26  --   GLUCOSE  --  203*  195*  BUN  --  27* 35*  CREATININE  --  1.79* 1.60*  CALCIUM  --  8.8*  --   MG 2.0  --   --    GFR: Estimated Creatinine Clearance: 26.2 mL/min (A) (by C-G formula  based on SCr of 1.6 mg/dL (H)).  Liver Function Tests:  Recent Labs Lab 03/31/17 1132  AST 70*  ALT 34  ALKPHOS 150*  BILITOT 1.4*  PROT 7.1  ALBUMIN 2.5*   Coagulation Profile:  Recent Labs Lab 03/31/17 1132  INR 1.54   BNP (last 3 results)  Recent Labs  09/20/16 1458 03/16/17 1441  PROBNP 140.0* 287.0*   CBG:  Recent Labs Lab 03/31/17 2346 04/01/17 0350  GLUCAP 71 167*        Radiological Exams on Admission: Personally reviewed CXR shows no edema; CT head/cspine reviewed reports, MR brain report reviewed: Ct Head Wo Contrast  Result Date: 03/31/2017 CLINICAL DATA:  Frontal headache.  Several falls over the past week. EXAM: CT HEAD WITHOUT CONTRAST CT CERVICAL SPINE WITHOUT CONTRAST TECHNIQUE: Multidetector CT imaging of the head and cervical spine was performed following the standard protocol without intravenous contrast. Multiplanar CT image reconstructions of the cervical spine were also generated. COMPARISON:  01/08/2012 FINDINGS: CT HEAD FINDINGS Brain: No evidence of acute infarction, hemorrhage, extra-axial collection, ventriculomegaly, or mass effect. Generalized cerebral atrophy. Periventricular white matter low attenuation likely secondary to microangiopathy. Vascular: Cerebrovascular atherosclerotic calcifications are noted. Skull: Negative for fracture or focal lesion. Sinuses/Orbits: Visualized portions of the orbits are unremarkable. Visualized portions of the paranasal sinuses and mastoid air cells are unremarkable. Other: None. CT CERVICAL SPINE FINDINGS Alignment: Normal. Skull base and vertebrae: No acute fracture. No primary bone lesion or focal pathologic process. Soft tissues and spinal canal: No prevertebral fluid or swelling. No visible canal hematoma. Disc levels: Degenerative disc disease with disc height loss at C3-4, C4-5, C5-6 and C6-7. Mild bilateral facet arthropathy at C2-3. Moderate bilateral facet arthropathy and moderate bilateral  foraminal stenosis at C3-4. Moderate bilateral facet arthropathy and severe right foraminal stenosis at C4-5. Moderate bilateral facet arthropathy and foraminal stenosis at C5-6. At C6-7 there is a broad-based disc osteophyte complex with left uncovertebral degenerative changes and moderate left facet arthropathy resulting in foraminal stenosis. Upper chest: Lung apices are clear. Other: No fluid collection or hematoma. Bilateral carotid artery atherosclerosis. IMPRESSION: 1. No acute intracranial pathology. 2. No acute osseous injury of the cervical spine. 3. Cervical spine spondylosis as described above. Electronically Signed   By: Elige Ko   On: 03/31/2017 12:31   Ct Cervical Spine Wo Contrast  Result Date: 03/31/2017 CLINICAL DATA:  Frontal headache.  Several falls over the past week. EXAM: CT HEAD WITHOUT CONTRAST CT CERVICAL SPINE WITHOUT CONTRAST TECHNIQUE: Multidetector CT imaging of the head and cervical spine was performed following the standard protocol without intravenous contrast. Multiplanar CT image reconstructions of the cervical spine were also generated. COMPARISON:  01/08/2012 FINDINGS: CT HEAD FINDINGS Brain: No evidence of acute infarction, hemorrhage, extra-axial collection, ventriculomegaly, or mass effect. Generalized cerebral atrophy. Periventricular white matter low attenuation likely secondary to microangiopathy. Vascular: Cerebrovascular atherosclerotic calcifications are noted. Skull: Negative for fracture or focal lesion. Sinuses/Orbits: Visualized portions of the orbits are unremarkable. Visualized portions of the paranasal sinuses and mastoid air cells are unremarkable. Other: None. CT CERVICAL SPINE FINDINGS Alignment: Normal. Skull base and vertebrae: No acute fracture. No primary bone lesion or focal pathologic process. Soft tissues and spinal canal: No prevertebral fluid  or swelling. No visible canal hematoma. Disc levels: Degenerative disc disease with disc height loss at  C3-4, C4-5, C5-6 and C6-7. Mild bilateral facet arthropathy at C2-3. Moderate bilateral facet arthropathy and moderate bilateral foraminal stenosis at C3-4. Moderate bilateral facet arthropathy and severe right foraminal stenosis at C4-5. Moderate bilateral facet arthropathy and foraminal stenosis at C5-6. At C6-7 there is a broad-based disc osteophyte complex with left uncovertebral degenerative changes and moderate left facet arthropathy resulting in foraminal stenosis. Upper chest: Lung apices are clear. Other: No fluid collection or hematoma. Bilateral carotid artery atherosclerosis. IMPRESSION: 1. No acute intracranial pathology. 2. No acute osseous injury of the cervical spine. 3. Cervical spine spondylosis as described above. Electronically Signed   By: Elige Ko   On: 03/31/2017 12:31   Mr Brain Wo Contrast (neuro Protocol)  Result Date: 03/31/2017 CLINICAL DATA:  Facial droop and slurred speech. History of memory loss, atrial fibrillation, dyslipidemia, pacemaker. EXAM: MRI HEAD WITHOUT CONTRAST TECHNIQUE: Multiplanar, multiecho pulse sequences of the brain and surrounding structures were obtained without intravenous contrast. COMPARISON:  CT HEAD March 31, 2017 at 1218 hours FINDINGS: BRAIN: No reduced diffusion to suggest acute ischemia. No susceptibility artifact to suggest hemorrhage. The ventricles and sulci are normal for patient's age. Patchy supratentorial white matter FLAIR T2 hyperintensities. Old tiny cerebellar infarcts. Old RIGHT thalamus lacunar infarct. Old RIGHT basal ganglia lacunar infarct. No suspicious parenchymal signal, masses or mass effect. No abnormal extra-axial fluid collections. VASCULAR: Normal major intracranial vascular flow voids present at skull base. SKULL AND UPPER CERVICAL SPINE: No abnormal sellar expansion. No suspicious calvarial bone marrow signal. Craniocervical junction maintained. Moderate degenerative change of the included cervical spine. SINUSES/ORBITS:  Trace paranasal sinus mucosal thickening without air-fluid levels. Mastoid air cells are well aerated. The included ocular globes and orbital contents are non-suspicious. Status post bilateral ocular lens implants. OTHER: Patient is edentulous. IMPRESSION: No acute intracranial process. Mild to moderate chronic small vessel ischemic disease. Old RIGHT basal ganglia and thalamus lacunar infarcts and, old tiny cerebellar infarcts. Electronically Signed   By: Awilda Metro M.D.   On: 03/31/2017 20:49   Dg Chest Port 1 View  Result Date: 03/31/2017 CLINICAL DATA:  Onset of slurred speech at 8 a.m. today. History of CHF, COPD, coronary artery disease EXAM: PORTABLE CHEST 1 VIEW COMPARISON:  PA and lateral chest x-ray of March 17, 2017 FINDINGS: The lungs are adequately inflated and clear. The cardiac silhouette is enlarged. The pulmonary vascularity is not engorged. The mediastinum is normal in width. The ICD is in stable position. There degenerative changes of the shoulders. IMPRESSION: Cardiomegaly. No pulmonary edema, pneumonia, nor other acute cardiopulmonary abnormality. Electronically Signed   By: David  [place] M.D.   On: 03/31/2017 12:16    EKG: Independently reviewed. Rate 71, paced.  Echocardiogram May 2018: Report reviewed EF 60-65% Mild LVH Grade I DD            Assessment/Plan  1. Weakness:  Unclear etiology.  May be related to her weight gain/very mild CHF flare.  Suspect more likely she is feeling under the weather from something else, and just decompensated somewhat in her elderly/frail/decompensated state.  No focal weakness to suggest TIA.  No MR evidence of stroke.  No fever or leukocytosis or evidence here to localize infection.     -Observe for fever -PT eval and likely home tomorrow if no new leukocytosis or fever   2. Acute diastolic CHF:  Mild.  Weight reportedly up 5 lbs.   -  IV Lasix once tomorrow, then reassess -Strict I/Os, daily weights -Repeat BMP  3.  Elevated LFTs:  Stable, no change from previous.  No ascites on exam, and no history of cirrhosis, but albumin and thrombocytopenia are noted.  4. Atrial fibrillation:  CHADS2Vasc 5.  -Continue Eliquis -Continue amiodarone  5. Iron deficiency anemia:  Stable  6. Insulin dependent diabetes:  -Glargine -SSI  -Hold orals  7. Hypertension:  -Continue statin, Imdur, metoprolol  8. CKD IV: Stable, no change from previous. -Daily BMP  9. Other medications: -Continue trazodone, allopurinol, duloxetine, PPI     DVT prophylaxis: N/A  Code Status: Partial (no code, no CPR, no intub)  Family Communication: Daughter and son at bedside, overnight plan discussed  Disposition Plan: Anticipate IV diuresis once, re-evaluate.  If no fever overnight, PT eval and likely home tomrorow. Consults called: None Admission status: OBS At the point of initial evaluation, it is my clinical opinion that admission for OBSERVATION is reasonable and necessary because the patient's presenting complaints in the context of their chronic conditions represent sufficient risk of deterioration or significant morbidity to constitute reasonable grounds for close observation in the hospital setting, but that the patient may be medically stable for discharge from the hospital within 24 to 48 hours.    Medical decision making: Patient seen at 8:30 PM on 03/31/2017.  The patient was discussed with Dr. Patria Mane.  What exists of the patient's chart was reviewed in depth and summarized above.  Clinical condition: stable.        Alberteen Sam [facility] Pager [phone]

## 2017-04-01 NOTE — Progress Notes (Addendum)
Daughter called stating that her "mom is hungry and she haven't ate since yesterday morning".  Explained that current diet is NPO per MD order.  Daughter requested to talk to MD since she was told that her mother will able to eat when she arrives to [facility] and she was told that stroke/TIA, are ruled out.  Paged admitting MD,Danford and clarified. Patient allowed to eat, order for heart healthy/ carb modified diet placed, SLP eval/treat canceled for tomorrow, since there is no need per MD.   Will continue to monitor.  Ely Spragg, RN

## 2017-04-01 NOTE — Progress Notes (Signed)
PROGRESS NOTE    Danielle Benitez  ZOX:[id number] DOB: 1931-11-24 DOA: 03/31/2017 PCP: Joaquim Nam, MD    Brief Narrative:   83 y.o. female with a past medical history significant for HTN, iron-def anemia, bradycardia with pacer, Afib on Eliquis, HFpEF and CKD III who presents with disparate complaints though mainly swelling for a few days and 5lb weight gain despite increased diuretics and then this morning generalized weakness and slurred speech  Assessment & Plan:   1. Weakness:  Unclear etiology.  Most likely related to her weight gain/very mild CHF flare and generalized debility from lack of activity.   - Pt evaluation and treatment - MRI negative for acute intracranial process   2. Acute diastolic CHF:  Mild.  Weight reportedly up 5 lbs.   -IV Lasix today and reassess - Patient is down from 88 to 87 kg today  3. Elevated LFTs:  Stable,  At baseline  4. Atrial fibrillation:  CHADS2Vasc 5.  -Continue anticoagulation with Eliquis -Continue amiodarone  5. Iron deficiency anemia:  Stable  6. Insulin dependent diabetes:  -continue Glargine -SSI  - diabetic diet.  7. Hypertension:  -Continue statin, Imdur, metoprolol  8. CKD IV: Stable, no change from previous. -Daily BMP   DVT prophylaxis: Eliquis Code Status: Full Family Communication: None at bedside  Disposition Plan: pending improvement in respiratory condition with activity after diuresis   Consultants:   None   Procedures: None   Antimicrobials: None   Subjective: Pt has no new complaints. No acute issues overnight.  Objective: Vitals:   03/31/17 2347 04/01/17 0359 04/01/17 1002 04/01/17 1213  BP: (!) 156/83 128/60 123/61 (!) 157/73  Pulse: 74 75 73 72  Resp: 15 15  20   Temp: 97.6 F (36.4 C) 97.8 F (36.6 C)  97.9 F (36.6 C)  TempSrc: Oral Oral  Oral  SpO2: 97% 100% 99% 97%  Weight: 88.4 kg (194 lb 14.4 oz) 87.7 kg (193 lb 4.8 oz)    Height: 5' (1.524 m)        Intake/Output Summary (Last 24 hours) at 04/01/17 1709 Last data filed at 04/01/17 1357  Gross per 24 hour  Intake              540 ml  Output              620 ml  Net              -80 ml   Filed Weights   03/31/17 2347 04/01/17 0359  Weight: 88.4 kg (194 lb 14.4 oz) 87.7 kg (193 lb 4.8 oz)    Examination:  General exam: Appears calm and comfortable, in nad.  Respiratory system: Clear to auscultation. Respiratory effort normal. Decreased breath sounds at bases Cardiovascular system: S1 & S2 heard, RRR. No JVD, murmurs, + edema non pitting Gastrointestinal system: Abdomen is nondistended, soft and nontender. No organomegaly or masses felt. Normal bowel sounds heard. Central nervous system: Alert and oriented. No focal neurological deficits. Extremities: Symmetric 5 x 5 power. Skin: No rashes, lesions or ulcers, on limited exam. Psychiatry: Mood & affect appropriate.     Data Reviewed: I have personally reviewed following labs and imaging studies  CBC:  Recent Labs Lab 03/31/17 1132 03/31/17 1218 04/01/17 0454  WBC 8.8  --  9.1  NEUTROABS 6.9  --   --   HGB 11.4* 13.3 10.8*  HCT 36.9 39.0 35.1*  MCV 90.4  --  92.1  PLT 103*  --  103*   Basic Metabolic Panel:  Recent Labs Lab 03/31/17 0018 03/31/17 1132 03/31/17 1218 04/01/17 0454  NA  --  137 140 141  K  --  4.2 4.2 4.3  CL  --  101 101 105  CO2  --  26  --  27  GLUCOSE  --  203* 195* 172*  BUN  --  27* 35* 31*  CREATININE  --  1.79* 1.60* 1.64*  CALCIUM  --  8.8*  --  8.5*  MG 2.0  --   --   --    GFR: Estimated Creatinine Clearance: 25.6 mL/min (A) (by C-G formula based on SCr of 1.64 mg/dL (H)). Liver Function Tests:  Recent Labs Lab 03/31/17 1132  AST 70*  ALT 34  ALKPHOS 150*  BILITOT 1.4*  PROT 7.1  ALBUMIN 2.5*   No results for input(s): LIPASE, AMYLASE in the last 168 hours. No results for input(s): AMMONIA in the last 168 hours. Coagulation Profile:  Recent Labs Lab  03/31/17 1132  INR 1.54   Cardiac Enzymes: No results for input(s): CKTOTAL, CKMB, CKMBINDEX, TROPONINI in the last 168 hours. BNP (last 3 results)  Recent Labs  09/20/16 1458 03/16/17 1441  PROBNP 140.0* 287.0*   HbA1C: No results for input(s): HGBA1C in the last 72 hours. CBG:  Recent Labs Lab 03/31/17 2346 04/01/17 0350 04/01/17 0748 04/01/17 1120 04/01/17 1619  GLUCAP 71 167* 162* 229* 213*   Lipid Profile: No results for input(s): CHOL, HDL, LDLCALC, TRIG, CHOLHDL, LDLDIRECT in the last 72 hours. Thyroid Function Tests: No results for input(s): TSH, T4TOTAL, FREET4, T3FREE, THYROIDAB in the last 72 hours. Anemia Panel: No results for input(s): VITAMINB12, FOLATE, FERRITIN, TIBC, IRON, RETICCTPCT in the last 72 hours. Sepsis Labs: No results for input(s): PROCALCITON, LATICACIDVEN in the last 168 hours.  No results found for this or any previous visit (from the past 240 hour(s)).       Radiology Studies: Ct Head Wo Contrast  Result Date: 03/31/2017 CLINICAL DATA:  Frontal headache.  Several falls over the past week. EXAM: CT HEAD WITHOUT CONTRAST CT CERVICAL SPINE WITHOUT CONTRAST TECHNIQUE: Multidetector CT imaging of the head and cervical spine was performed following the standard protocol without intravenous contrast. Multiplanar CT image reconstructions of the cervical spine were also generated. COMPARISON:  01/08/2012 FINDINGS: CT HEAD FINDINGS Brain: No evidence of acute infarction, hemorrhage, extra-axial collection, ventriculomegaly, or mass effect. Generalized cerebral atrophy. Periventricular white matter low attenuation likely secondary to microangiopathy. Vascular: Cerebrovascular atherosclerotic calcifications are noted. Skull: Negative for fracture or focal lesion. Sinuses/Orbits: Visualized portions of the orbits are unremarkable. Visualized portions of the paranasal sinuses and mastoid air cells are unremarkable. Other: None. CT CERVICAL SPINE FINDINGS  Alignment: Normal. Skull base and vertebrae: No acute fracture. No primary bone lesion or focal pathologic process. Soft tissues and spinal canal: No prevertebral fluid or swelling. No visible canal hematoma. Disc levels: Degenerative disc disease with disc height loss at C3-4, C4-5, C5-6 and C6-7. Mild bilateral facet arthropathy at C2-3. Moderate bilateral facet arthropathy and moderate bilateral foraminal stenosis at C3-4. Moderate bilateral facet arthropathy and severe right foraminal stenosis at C4-5. Moderate bilateral facet arthropathy and foraminal stenosis at C5-6. At C6-7 there is a broad-based disc osteophyte complex with left uncovertebral degenerative changes and moderate left facet arthropathy resulting in foraminal stenosis. Upper chest: Lung apices are clear. Other: No fluid collection or hematoma. Bilateral carotid artery atherosclerosis. IMPRESSION: 1. No acute intracranial pathology. 2. No acute osseous  injury of the cervical spine. 3. Cervical spine spondylosis as described above. Electronically Signed   By: Elige Ko   On: 03/31/2017 12:31   Ct Cervical Spine Wo Contrast  Result Date: 03/31/2017 CLINICAL DATA:  Frontal headache.  Several falls over the past week. EXAM: CT HEAD WITHOUT CONTRAST CT CERVICAL SPINE WITHOUT CONTRAST TECHNIQUE: Multidetector CT imaging of the head and cervical spine was performed following the standard protocol without intravenous contrast. Multiplanar CT image reconstructions of the cervical spine were also generated. COMPARISON:  01/08/2012 FINDINGS: CT HEAD FINDINGS Brain: No evidence of acute infarction, hemorrhage, extra-axial collection, ventriculomegaly, or mass effect. Generalized cerebral atrophy. Periventricular white matter low attenuation likely secondary to microangiopathy. Vascular: Cerebrovascular atherosclerotic calcifications are noted. Skull: Negative for fracture or focal lesion. Sinuses/Orbits: Visualized portions of the orbits are  unremarkable. Visualized portions of the paranasal sinuses and mastoid air cells are unremarkable. Other: None. CT CERVICAL SPINE FINDINGS Alignment: Normal. Skull base and vertebrae: No acute fracture. No primary bone lesion or focal pathologic process. Soft tissues and spinal canal: No prevertebral fluid or swelling. No visible canal hematoma. Disc levels: Degenerative disc disease with disc height loss at C3-4, C4-5, C5-6 and C6-7. Mild bilateral facet arthropathy at C2-3. Moderate bilateral facet arthropathy and moderate bilateral foraminal stenosis at C3-4. Moderate bilateral facet arthropathy and severe right foraminal stenosis at C4-5. Moderate bilateral facet arthropathy and foraminal stenosis at C5-6. At C6-7 there is a broad-based disc osteophyte complex with left uncovertebral degenerative changes and moderate left facet arthropathy resulting in foraminal stenosis. Upper chest: Lung apices are clear. Other: No fluid collection or hematoma. Bilateral carotid artery atherosclerosis. IMPRESSION: 1. No acute intracranial pathology. 2. No acute osseous injury of the cervical spine. 3. Cervical spine spondylosis as described above. Electronically Signed   By: Elige Ko   On: 03/31/2017 12:31   Mr Brain Wo Contrast (neuro Protocol)  Result Date: 03/31/2017 CLINICAL DATA:  Facial droop and slurred speech. History of memory loss, atrial fibrillation, dyslipidemia, pacemaker. EXAM: MRI HEAD WITHOUT CONTRAST TECHNIQUE: Multiplanar, multiecho pulse sequences of the brain and surrounding structures were obtained without intravenous contrast. COMPARISON:  CT HEAD March 31, 2017 at 1218 hours FINDINGS: BRAIN: No reduced diffusion to suggest acute ischemia. No susceptibility artifact to suggest hemorrhage. The ventricles and sulci are normal for patient's age. Patchy supratentorial white matter FLAIR T2 hyperintensities. Old tiny cerebellar infarcts. Old RIGHT thalamus lacunar infarct. Old RIGHT basal ganglia  lacunar infarct. No suspicious parenchymal signal, masses or mass effect. No abnormal extra-axial fluid collections. VASCULAR: Normal major intracranial vascular flow voids present at skull base. SKULL AND UPPER CERVICAL SPINE: No abnormal sellar expansion. No suspicious calvarial bone marrow signal. Craniocervical junction maintained. Moderate degenerative change of the included cervical spine. SINUSES/ORBITS: Trace paranasal sinus mucosal thickening without air-fluid levels. Mastoid air cells are well aerated. The included ocular globes and orbital contents are non-suspicious. Status post bilateral ocular lens implants. OTHER: Patient is edentulous. IMPRESSION: No acute intracranial process. Mild to moderate chronic small vessel ischemic disease. Old RIGHT basal ganglia and thalamus lacunar infarcts and, old tiny cerebellar infarcts. Electronically Signed   By: Awilda Metro M.D.   On: 03/31/2017 20:49   Dg Chest Port 1 View  Result Date: 03/31/2017 CLINICAL DATA:  Onset of slurred speech at 8 a.m. today. History of CHF, COPD, coronary artery disease EXAM: PORTABLE CHEST 1 VIEW COMPARISON:  PA and lateral chest x-ray of March 17, 2017 FINDINGS: The lungs are adequately inflated and clear.  The cardiac silhouette is enlarged. The pulmonary vascularity is not engorged. The mediastinum is normal in width. The ICD is in stable position. There degenerative changes of the shoulders. IMPRESSION: Cardiomegaly. No pulmonary edema, pneumonia, nor other acute cardiopulmonary abnormality. Electronically Signed   By: David  [place] M.D.   On: 03/31/2017 12:16        Scheduled Meds: . allopurinol  300 mg Oral Daily  . amiodarone  200 mg Oral Daily  . apixaban  2.5 mg Oral BID  . atorvastatin  20 mg Oral q1800  . DULoxetine  20 mg Oral QHS  . furosemide  80 mg Intravenous Daily  . insulin aspart  0-15 Units Subcutaneous Q4H  . insulin glargine  10 Units Subcutaneous Q2200  . isosorbide mononitrate  30 mg Oral  Daily  . mouth rinse  15 mL Mouth Rinse BID  . metoprolol succinate  50 mg Oral BID  . pantoprazole  40 mg Oral Daily  . potassium chloride  40 mEq Oral BID  . sodium chloride flush  3 mL Intravenous Q12H   Continuous Infusions:   LOS: 0 days    Time spent: > 35 minutes    Penny Pia, MD [facility] Pager [phone]  If 7PM-7AM, please contact night-coverage [email or web address] Password [email or web address] 04/01/2017, 5:09 PM

## 2017-04-01 NOTE — Care Management Note (Addendum)
Case Management Note  Patient Details  Name: Danielle Benitez MRN: [id number] Date of Birth: 1932-06-21  Subjective/Objective:   Admitted with Weakness                Action/Plan: Patient lives at home with daughter; PCP: Joaquim Nam, MD; has private insurance with [facility] with prescription drug coverage; patient is active with [facility] as prior to admission for [facility] / PT. Expected Discharge Date:   possibly 04/02/2017               Expected Discharge Plan:  Home w Home Health Services  In-House Referral:   [facility]  Discharge planning Services  CM Consult  HH Arranged:  RN, PT [facility] Agency:  [facility]  Status of Service:  In process, will continue to follow  Reola Mosher [phone] 04/01/2017, 2:59 PM

## 2017-04-02 LAB — GLUCOSE, CAPILLARY
GLUCOSE-CAPILLARY: 124 mg/dL — AB (ref 65–99)
GLUCOSE-CAPILLARY: 281 mg/dL — AB (ref 65–99)
Glucose-Capillary: 113 mg/dL — ABNORMAL HIGH (ref 65–99)
Glucose-Capillary: 141 mg/dL — ABNORMAL HIGH (ref 65–99)
Glucose-Capillary: 219 mg/dL — ABNORMAL HIGH (ref 65–99)
Glucose-Capillary: 353 mg/dL — ABNORMAL HIGH (ref 65–99)
Glucose-Capillary: 73 mg/dL (ref 65–99)

## 2017-04-02 LAB — BASIC METABOLIC PANEL
Anion gap: 7 (ref 5–15)
BUN: 28 mg/dL — ABNORMAL HIGH (ref 6–20)
CHLORIDE: 105 mmol/L (ref 101–111)
CO2: 28 mmol/L (ref 22–32)
Calcium: 8.7 mg/dL — ABNORMAL LOW (ref 8.9–10.3)
Creatinine, Ser: 1.9 mg/dL — ABNORMAL HIGH (ref 0.44–1.00)
GFR calc non Af Amer: 23 mL/min — ABNORMAL LOW (ref >60)
GFR, EST AFRICAN AMERICAN: 27 mL/min — AB (ref >60)
Glucose, Bld: 81 mg/dL (ref 65–99)
POTASSIUM: 4.9 mmol/L (ref 3.5–5.1)
SODIUM: 140 mmol/L (ref 135–145)

## 2017-04-02 MED ORDER — LOPERAMIDE HCL 2 MG PO CAPS
2.0000 mg | ORAL_CAPSULE | Freq: Once | ORAL | Status: AC
  Administered 2017-04-02: 2 mg via ORAL
  Filled 2017-04-02: qty 1

## 2017-04-02 MED ORDER — METOCLOPRAMIDE HCL 5 MG PO TABS
5.0000 mg | ORAL_TABLET | Freq: Once | ORAL | Status: AC | PRN
  Administered 2017-04-02: 5 mg via ORAL
  Filled 2017-04-02: qty 1

## 2017-04-02 MED ORDER — FUROSEMIDE 40 MG PO TABS
40.0000 mg | ORAL_TABLET | Freq: Two times a day (BID) | ORAL | Status: DC
  Administered 2017-04-03: 40 mg via ORAL
  Filled 2017-04-02: qty 1

## 2017-04-02 NOTE — Care Management Obs Status (Signed)
MEDICARE OBSERVATION STATUS NOTIFICATION   Patient Details  Name: Danielle Benitez MRN: [id number] Date of Birth: [birth year]   Medicare Observation Status Notification Given:  Yes    CrutchfieldDerrill Memo, RN 04/02/2017, 4:37 PM

## 2017-04-02 NOTE — Progress Notes (Signed)
PROGRESS NOTE    SUDA FORBESS  ZOX:[id number] DOB: 10-Feb-1932 DOA: 03/31/2017 PCP: Joaquim Nam, MD    Brief Narrative:   83 y.o. female with a past medical history significant for HTN, iron-def anemia, bradycardia with pacer, Afib on Eliquis, HFpEF and CKD III who presents with disparate complaints though mainly swelling for a few days and 5lb weight gain despite increased diuretics and then this morning generalized weakness and slurred speech  Assessment & Plan:   1. Weakness:  Unclear etiology.  Most likely related to her weight gain/very mild CHF flare and generalized debility from lack of activity.   - MRI obtained reported negative for acute intracranial process - Physical therapy evaluation ordered after evaluation there recommending home health PT  2. Acute diastolic CHF:  Mild.  Weight reportedly up 5 lbs.   - IV Lasix today. Do not suspect it intake and output are accurate - Weight is at 88 kg today. We'll reassess next a.m. patient may require increase Lasix dose although of note her serum creatinine is trending up.  3. Elevated LFTs:  Stable,  At baseline  4. Atrial fibrillation:  CHADS2Vasc 5.  - anticoagulated with Eliquis - Rate controlled on amiodarone  5. Iron deficiency anemia:  Stable  6. Insulin dependent diabetes:  -continue Glargine -Continue SSI  - Continue diabetic diet.  7. Hypertension:  -Continue statin, Imdur, metoprolol  8. CKD IV: -Daily BMP - Serum creatinine slightly up  9. Nausea - unclear etiology - administer reglan. Pt having bowel movement as such do not suspect she is having SBO. No distension    DVT prophylaxis: Eliquis Code Status: Full Family Communication: None at bedside  Disposition Plan: pending improvement in respiratory condition with activity after diuresis.    Consultants:   None   Procedures: None   Antimicrobials: None   Subjective: Pt has no new complaints. No acute issues  overnight.  Objective: Vitals:   04/01/17 2018 04/02/17 0012 04/02/17 0435 04/02/17 1226  BP: (!) 131/52 (!) 128/42 (!) 133/53 (!) 120/55  Pulse: 71 82 71 70  Resp: 20 20 20 20   Temp: 98.7 F (37.1 C) 99.3 F (37.4 C) 98.7 F (37.1 C) 98.2 F (36.8 C)  TempSrc: Oral Oral Oral Oral  SpO2: 100% 92% 96% 100%  Weight:   88.1 kg (194 lb 3.6 oz)   Height:        Intake/Output Summary (Last 24 hours) at 04/02/17 1422 Last data filed at 04/02/17 0919  Gross per 24 hour  Intake              600 ml  Output              700 ml  Net             -100 ml   Filed Weights   03/31/17 2347 04/01/17 0359 04/02/17 0435  Weight: 88.4 kg (194 lb 14.4 oz) 87.7 kg (193 lb 4.8 oz) 88.1 kg (194 lb 3.6 oz)    Examination:  General exam: Patient in no acute distress, alert and awake Respiratory system: Equal chest rise, no wheezes Cardiovascular system: S1 & S2 heard, RRR. No JVD, murmurs, + edema non pitting Gastrointestinal system: Abdomen is nondistended, soft and nontender. No organomegaly or masses felt. Normal bowel sounds heard.No guarding Central nervous system: Alert and oriented. No focal neurological deficits. Extremities: Symmetric 5 x 5 power. Skin: No rashes, lesions or ulcers, on limited exam. Psychiatry: Mood & affect appropriate.  Data Reviewed: I have personally reviewed following labs and imaging studies  CBC:  Recent Labs Lab 03/31/17 1132 03/31/17 1218 04/01/17 0454  WBC 8.8  --  9.1  NEUTROABS 6.9  --   --   HGB 11.4* 13.3 10.8*  HCT 36.9 39.0 35.1*  MCV 90.4  --  92.1  PLT 103*  --  103*   Basic Metabolic Panel:  Recent Labs Lab 03/31/17 0018 03/31/17 1132 03/31/17 1218 04/01/17 0454 04/02/17 0258  NA  --  137 140 141 140  K  --  4.2 4.2 4.3 4.9  CL  --  101 101 105 105  CO2  --  26  --  27 28  GLUCOSE  --  203* 195* 172* 81  BUN  --  27* 35* 31* 28*  CREATININE  --  1.79* 1.60* 1.64* 1.90*  CALCIUM  --  8.8*  --  8.5* 8.7*  MG 2.0  --   --    --   --    GFR: Estimated Creatinine Clearance: 22.1 mL/min (A) (by C-G formula based on SCr of 1.9 mg/dL (H)). Liver Function Tests:  Recent Labs Lab 03/31/17 1132  AST 70*  ALT 34  ALKPHOS 150*  BILITOT 1.4*  PROT 7.1  ALBUMIN 2.5*   No results for input(s): LIPASE, AMYLASE in the last 168 hours. No results for input(s): AMMONIA in the last 168 hours. Coagulation Profile:  Recent Labs Lab 03/31/17 1132  INR 1.54   Cardiac Enzymes: No results for input(s): CKTOTAL, CKMB, CKMBINDEX, TROPONINI in the last 168 hours. BNP (last 3 results)  Recent Labs  09/20/16 1458 03/16/17 1441  PROBNP 140.0* 287.0*   HbA1C: No results for input(s): HGBA1C in the last 72 hours. CBG:  Recent Labs Lab 04/01/17 2015 04/02/17 0006 04/02/17 0425 04/02/17 0734 04/02/17 1141  GLUCAP 169* 141* 73 124* 219*   Lipid Profile: No results for input(s): CHOL, HDL, LDLCALC, TRIG, CHOLHDL, LDLDIRECT in the last 72 hours. Thyroid Function Tests: No results for input(s): TSH, T4TOTAL, FREET4, T3FREE, THYROIDAB in the last 72 hours. Anemia Panel: No results for input(s): VITAMINB12, FOLATE, FERRITIN, TIBC, IRON, RETICCTPCT in the last 72 hours. Sepsis Labs: No results for input(s): PROCALCITON, LATICACIDVEN in the last 168 hours.  No results found for this or any previous visit (from the past 240 hour(s)).       Radiology Studies: Mr Brain Wo Contrast (neuro Protocol)  Result Date: 03/31/2017 CLINICAL DATA:  Facial droop and slurred speech. History of memory loss, atrial fibrillation, dyslipidemia, pacemaker. EXAM: MRI HEAD WITHOUT CONTRAST TECHNIQUE: Multiplanar, multiecho pulse sequences of the brain and surrounding structures were obtained without intravenous contrast. COMPARISON:  CT HEAD March 31, 2017 at 1218 hours FINDINGS: BRAIN: No reduced diffusion to suggest acute ischemia. No susceptibility artifact to suggest hemorrhage. The ventricles and sulci are normal for patient's age.  Patchy supratentorial white matter FLAIR T2 hyperintensities. Old tiny cerebellar infarcts. Old RIGHT thalamus lacunar infarct. Old RIGHT basal ganglia lacunar infarct. No suspicious parenchymal signal, masses or mass effect. No abnormal extra-axial fluid collections. VASCULAR: Normal major intracranial vascular flow voids present at skull base. SKULL AND UPPER CERVICAL SPINE: No abnormal sellar expansion. No suspicious calvarial bone marrow signal. Craniocervical junction maintained. Moderate degenerative change of the included cervical spine. SINUSES/ORBITS: Trace paranasal sinus mucosal thickening without air-fluid levels. Mastoid air cells are well aerated. The included ocular globes and orbital contents are non-suspicious. Status post bilateral ocular lens implants. OTHER: Patient is edentulous. IMPRESSION:  No acute intracranial process. Mild to moderate chronic small vessel ischemic disease. Old RIGHT basal ganglia and thalamus lacunar infarcts and, old tiny cerebellar infarcts. Electronically Signed   By: Awilda Metro M.D.   On: 03/31/2017 20:49        Scheduled Meds: . allopurinol  300 mg Oral Daily  . amiodarone  200 mg Oral Daily  . apixaban  2.5 mg Oral BID  . atorvastatin  20 mg Oral q1800  . DULoxetine  20 mg Oral QHS  . furosemide  80 mg Intravenous Daily  . insulin aspart  0-15 Units Subcutaneous Q4H  . insulin glargine  10 Units Subcutaneous Q2200  . isosorbide mononitrate  30 mg Oral Daily  . mouth rinse  15 mL Mouth Rinse BID  . metoprolol succinate  50 mg Oral BID  . pantoprazole  40 mg Oral Daily  . potassium chloride  40 mEq Oral BID  . sodium chloride flush  3 mL Intravenous Q12H   Continuous Infusions:   LOS: 0 days    Time spent: > 35 minutes  Penny Pia, MD [facility] Pager [phone]  If 7PM-7AM, please contact night-coverage [email or web address] Password [email or web address] 04/02/2017, 2:22 PM

## 2017-04-03 ENCOUNTER — Encounter (HOSPITAL_COMMUNITY): Payer: Self-pay | Admitting: Physician Assistant

## 2017-04-03 DIAGNOSIS — D696 Thrombocytopenia, unspecified: Secondary | ICD-10-CM | POA: Diagnosis present

## 2017-04-03 DIAGNOSIS — K219 Gastro-esophageal reflux disease without esophagitis: Secondary | ICD-10-CM | POA: Diagnosis present

## 2017-04-03 DIAGNOSIS — E785 Hyperlipidemia, unspecified: Secondary | ICD-10-CM | POA: Diagnosis present

## 2017-04-03 DIAGNOSIS — I5033 Acute on chronic diastolic (congestive) heart failure: Secondary | ICD-10-CM

## 2017-04-03 DIAGNOSIS — Z950 Presence of cardiac pacemaker: Secondary | ICD-10-CM | POA: Diagnosis not present

## 2017-04-03 DIAGNOSIS — Z888 Allergy status to other drugs, medicaments and biological substances status: Secondary | ICD-10-CM | POA: Diagnosis not present

## 2017-04-03 DIAGNOSIS — I480 Paroxysmal atrial fibrillation: Secondary | ICD-10-CM | POA: Diagnosis not present

## 2017-04-03 DIAGNOSIS — D509 Iron deficiency anemia, unspecified: Secondary | ICD-10-CM | POA: Diagnosis present

## 2017-04-03 DIAGNOSIS — E1122 Type 2 diabetes mellitus with diabetic chronic kidney disease: Secondary | ICD-10-CM | POA: Diagnosis present

## 2017-04-03 DIAGNOSIS — J441 Chronic obstructive pulmonary disease with (acute) exacerbation: Secondary | ICD-10-CM | POA: Diagnosis present

## 2017-04-03 DIAGNOSIS — R110 Nausea: Secondary | ICD-10-CM | POA: Diagnosis present

## 2017-04-03 DIAGNOSIS — I481 Persistent atrial fibrillation: Secondary | ICD-10-CM | POA: Diagnosis not present

## 2017-04-03 DIAGNOSIS — R001 Bradycardia, unspecified: Secondary | ICD-10-CM | POA: Diagnosis present

## 2017-04-03 DIAGNOSIS — R531 Weakness: Secondary | ICD-10-CM | POA: Diagnosis not present

## 2017-04-03 DIAGNOSIS — Z7901 Long term (current) use of anticoagulants: Secondary | ICD-10-CM | POA: Diagnosis not present

## 2017-04-03 DIAGNOSIS — I5043 Acute on chronic combined systolic (congestive) and diastolic (congestive) heart failure: Secondary | ICD-10-CM | POA: Diagnosis present

## 2017-04-03 DIAGNOSIS — I2510 Atherosclerotic heart disease of native coronary artery without angina pectoris: Secondary | ICD-10-CM | POA: Diagnosis present

## 2017-04-03 DIAGNOSIS — R29810 Facial weakness: Secondary | ICD-10-CM | POA: Diagnosis present

## 2017-04-03 DIAGNOSIS — E875 Hyperkalemia: Secondary | ICD-10-CM | POA: Diagnosis present

## 2017-04-03 DIAGNOSIS — Z79899 Other long term (current) drug therapy: Secondary | ICD-10-CM | POA: Diagnosis not present

## 2017-04-03 DIAGNOSIS — I130 Hypertensive heart and chronic kidney disease with heart failure and stage 1 through stage 4 chronic kidney disease, or unspecified chronic kidney disease: Secondary | ICD-10-CM | POA: Diagnosis present

## 2017-04-03 DIAGNOSIS — I252 Old myocardial infarction: Secondary | ICD-10-CM | POA: Diagnosis not present

## 2017-04-03 DIAGNOSIS — N184 Chronic kidney disease, stage 4 (severe): Secondary | ICD-10-CM | POA: Diagnosis present

## 2017-04-03 DIAGNOSIS — E669 Obesity, unspecified: Secondary | ICD-10-CM | POA: Diagnosis present

## 2017-04-03 DIAGNOSIS — Z794 Long term (current) use of insulin: Secondary | ICD-10-CM | POA: Diagnosis not present

## 2017-04-03 LAB — GLUCOSE, CAPILLARY
GLUCOSE-CAPILLARY: 348 mg/dL — AB (ref 65–99)
GLUCOSE-CAPILLARY: 279 mg/dL — AB (ref 65–99)
Glucose-Capillary: 137 mg/dL — ABNORMAL HIGH (ref 65–99)
Glucose-Capillary: 169 mg/dL — ABNORMAL HIGH (ref 65–99)
Glucose-Capillary: 225 mg/dL — ABNORMAL HIGH (ref 65–99)
Glucose-Capillary: 229 mg/dL — ABNORMAL HIGH (ref 65–99)

## 2017-04-03 LAB — BASIC METABOLIC PANEL
Anion gap: 7 (ref 5–15)
BUN: 29 mg/dL — ABNORMAL HIGH (ref 6–20)
CALCIUM: 8.8 mg/dL — AB (ref 8.9–10.3)
CO2: 26 mmol/L (ref 22–32)
CREATININE: 2.03 mg/dL — AB (ref 0.44–1.00)
Chloride: 103 mmol/L (ref 101–111)
GFR calc non Af Amer: 22 mL/min — ABNORMAL LOW (ref >60)
GFR, EST AFRICAN AMERICAN: 25 mL/min — AB (ref >60)
GLUCOSE: 148 mg/dL — AB (ref 65–99)
Potassium: 6.1 mmol/L — ABNORMAL HIGH (ref 3.5–5.1)
Sodium: 136 mmol/L (ref 135–145)

## 2017-04-03 LAB — POTASSIUM: Potassium: 5.8 mmol/L — ABNORMAL HIGH (ref 3.5–5.1)

## 2017-04-03 MED ORDER — METOPROLOL SUCCINATE ER 25 MG PO TB24
25.0000 mg | ORAL_TABLET | Freq: Two times a day (BID) | ORAL | Status: DC
  Administered 2017-04-03 – 2017-04-05 (×5): 25 mg via ORAL
  Filled 2017-04-03 (×5): qty 1

## 2017-04-03 MED ORDER — BACITRACIN-NEOMYCIN-POLYMYXIN OINTMENT TUBE
TOPICAL_OINTMENT | CUTANEOUS | Status: DC | PRN
  Administered 2017-04-03: 18:00:00 via TOPICAL
  Filled 2017-04-03: qty 14.17

## 2017-04-03 MED ORDER — SODIUM POLYSTYRENE SULFONATE 15 GM/60ML PO SUSP
15.0000 g | Freq: Once | ORAL | Status: AC
  Administered 2017-04-03: 15 g via ORAL
  Filled 2017-04-03: qty 60

## 2017-04-03 MED ORDER — FUROSEMIDE 10 MG/ML IJ SOLN
80.0000 mg | Freq: Once | INTRAMUSCULAR | Status: AC
  Administered 2017-04-03: 80 mg via INTRAVENOUS
  Filled 2017-04-03: qty 8

## 2017-04-03 MED ORDER — FUROSEMIDE 10 MG/ML IJ SOLN
40.0000 mg | Freq: Once | INTRAMUSCULAR | Status: AC
  Administered 2017-04-03: 40 mg via INTRAVENOUS
  Filled 2017-04-03: qty 4

## 2017-04-03 NOTE — Consult Note (Addendum)
Cardiology Consultation:   Patient ID: [name]; [id number]; Aug 01, 1932   Admit date: 03/31/2017 Date of Consult: 04/03/2017  Primary Care Provider: Joaquim Nam, MD Primary Cardiologist:Temprence Rhines (CHF) Primary EP: Dr. Ladona Ridgel   Patient Profile:   Danielle Benitez is a 83 y.o. female with a hx of chronic diastolic CHF (with resolution of prior LV dysfunction by 02/2017 echo), PAF, HTN, orthostatic hypotension, DM, prior mild AS by echo 2014 (not seen on 02/2017 echo), CKD stage III-V (baseline Cr appears to be around 1.6-2.0), symptomatic bradycardia s/p Medtronic PPM (prior PPM infection with replacement 2017), RBBB, LAFB, mild CAD by cath 2013 (20-30% prox RCA), obesity, dyslipidemia, iron deficiency anemia, moderate carotid disease (40-59% 06/2016), occult strokes seen on MRI whom we are asked to see by Dr. Cena Benton for CHF.  History of Present Illness:   Tumultuous course the last 2 years - around the beginning of 9/16, she began to develop exertional dyspnea. She was seen in the cardiology office on 07/18/15. She was in atrial fibrillation with RVR and echo showed EF 35-40% (had been 50-55% in past), mild AS, mod TR, PASP , small pericardial effusion. PPM interrogation showed that atrial fibrillation had begun 07/11/15, so exertional dyspnea had preceded this by about 4 weeks. OP labs revealed Hgb 8.7 prompting admission from 10/7-10/17.  She was heme negative but Fe deficient. EGD was not done. She had TEE-guided DCCV to NSR. She also had AKI with creatinine up to 3.41, but it trended back down. She was sent home in NSR. She was readmitted 10/19-10/23/16 with recurrent atrial fibrillation and acute on chronic systolic CHF requiring diuresis. It was decided to rate control her rather than repeat cardioversion, and she was sent home.She was only at home for about a day and developed increased dyspnea so came back to the ER and was re-admitted.  She was diuresed with IV Lasix and started on  amiodarone.  She underwent DCCV on 08/06/15 but atrial fibrillation recurred on 08/09/15.  More amiodarone was loaded, and she went back into NSR.  Lexiscan Cardiolite in 08/2015 showed basal inferior/inferolateral fixed defect consistent with infarction but no ischemia.  Repeat echo was done 10/2015 showing EF 45% with normal RV size and mildly decreased systolic function. She was admitted in 7/17 with suspected infection of her device.  She had her PPM extracted and got a temporary-permanent device with later Medtronic PPM replacement. F/u echo in 05/2016 showed EF 55-60% with mild LVH, normal RV size and systolic function.   This year, she was admitted 02/14/17-02/16/17 with chest pain caused after she had a choking episode. Esophagram showed esophageal dysmotility with hiatal hernia. 2D Echo 02/15/17 showed mild LVH, EF 60-65%, grade 1 DD, mildly calcified MV annulus, no other significant disease. She was seen by Dr. Shirlee Latch in the hospital and felt to be euvolemic with discharge weight 188 pounds. She was seen in the ED 03/17/17 for SOB and edema requiring IV Lasix. She was sent home due to feeling better but at OV the next day 03/18/17 was still volume overloaded in the setting of eating bologna sandwich and adding salt to most of her foods, drinking more than 2L per day. She was given a dose of metolazone and Lasix was increased to 80mg  BID.  On 03/31/17 she woke up feeling hot, sweaty, with significant weakness so much that she could not sit up and family couldn't lift her. She had increased SOB, abdominal swelling and weight gain despite diuretic increase. She was  also noted to have slurred speech. She ruled out for stroke - continued to have some intermittent slurred speech observed even after this was ruled out. MRI did not show acute finding but did note Old RIGHT basal ganglia and thalamus lacunar infarcts and, old tiny cerebellar Infarcts. CXR cardiomegaly but no acute finding. Admitting labs showed Cr 1.79,  albumin 2.5, AST 70, K 4.2, Hgb 11.4, troponin neg, BNP 398, UA wnl -> subsequent CBC showed Hgb 10.8 with plt 103 (recent baseline Hgb around 10 range, plts intermittently low earlier this year). Cr trend 1.79 ->1.64 -> 1.90 ->2.03, K now 6.1 being addressed by IM. This admission has received 80mg  IV Lasix daily x 2 days then 40mg  IV this AM. Cardiology consulted due to lack of weight change (consistently 194), rising Cr and softer BP. I/Os not particularly revealing, currently -320. She currently states she feels much less weak but not back to baseline. She reports she continues to get SOB with even minimal activity but states this is typical for her. No LEE, chest pain or syncope. When asked about sodium restriction compliance, I'm not convinced she's changed anything since last discussion - states "they told me I could still have a little."   Past Medical History:  Diagnosis Date  . Abnormality of gait   . Anemia, unspecified   . Anticoagulated- Eliquis 07/31/2015  . Anxiety state, unspecified   . Aortic stenosis    a. Mild by echo 08/2013, not seen on repeat echo 02/2017.  Marland Kitchen Bifascicular block    afib  . Carotid artery disease (HCC)    a. Carotid dopplers (9/17) with 40-59% LICA stenosis (of note, prior ABI normal).  . Chronic diastolic CHF (congestive heart failure) (HCC)    a. Dx 07/2015 - EF 35-40%, unclear etiology ? r/t afib or viral etiology. b. EF normal in 02/2017.  . CKD (chronic kidney disease), stage III   . Depressive disorder, not elsewhere classified   . DM (diabetes mellitus), type 2, uncontrolled, with renal complications (HCC) 01/08/2012  . Eczema 11/22/2013  . Esophageal dysmotility 08/05/2015  . Essential hypertension   . Female stress incontinence 05/06/2015  . Full dentures   . GERD (gastroesophageal reflux disease)   . Gout   . HOH (hard of hearing)   . Internal hemorrhoids without mention of complication   . Iron deficiency   . Lumbago   . Memory loss   . Mild  CAD    a. Cath 2013: 20-30% prox RCA.  Marland Kitchen Myocardial infarction (HCC) 07/2015  . Nonspecific abnormal results of liver function study   . Obesity, unspecified   . Orthostatic hypotension   . Osteoarthritis   . Other and unspecified hyperlipidemia   . PAF (paroxysmal atrial fibrillation) (HCC) 07/11/2015   a. prior DCCVs, amiodarone, anticoagulation.  . Pain in joint, shoulder region 11/22/2013   left   . Personal history of fall   . Presence of permanent cardiac pacemaker   . Pruritus 11/22/2013  . S/P cardiac pacemaker procedure 01/11/2012   a. for symptomatic bradycardia. Followed by Dr. Ladona Ridgel. Device changed out 2017 due to ppm infection.  . Stroke [facility])    a. evidence of prior strokes on MRI in 2018.  . Wears glasses     Past Surgical History:  Procedure Laterality Date  . CARDIAC CATHETERIZATION  12/03/2003   Dr Clarene Duke  . CARDIOVERSION N/A 07/25/2015   Procedure: CARDIOVERSION;  Surgeon: Pricilla Riffle, MD;  Location: [facility] ENDOSCOPY;  Service: Cardiovascular;  Laterality: N/A;  . CARDIOVERSION N/A 08/06/2015   Procedure: CARDIOVERSION;  Surgeon: Laurey Morale, MD;  Location: [facility] ENDOSCOPY;  Service: Cardiovascular;  Laterality: N/A;  . CATARACT EXTRACTION W/ INTRAOCULAR LENS  IMPLANT, BILATERAL Bilateral 03/2015-04/2015  . COLONOSCOPY     ?????  . EP IMPLANTABLE DEVICE N/A 04/15/2016   Procedure: Pacemaker Implant;  Surgeon: Marinus Maw, MD;  Location: [facility] INVASIVE CV LAB;  Service: Cardiovascular;  Laterality: N/A;  . ESOPHAGOGASTRODUODENOSCOPY N/A 08/08/2015   Procedure: ESOPHAGOGASTRODUODENOSCOPY (EGD);  Surgeon: Iva Boop, MD;  Location: [facility] ENDOSCOPY;  Service: Endoscopy;  Laterality: N/A;  . EXTREMITY CYST EXCISION     finger  . EYE SURGERY Bilateral    catartact  . INSERT / REPLACE / REMOVE PACEMAKER    . LEFT HEART CATHETERIZATION WITH CORONARY ANGIOGRAM N/A 01/10/2012   Procedure: LEFT HEART CATHETERIZATION WITH CORONARY ANGIOGRAM;  Surgeon: Chrystie Nose, MD;   Location: [facility] CATH LAB;  Service: Cardiovascular;  Laterality: N/A;  . ORIF WRIST FRACTURE Right 04/09/2014   Procedure: OPEN REDUCTION INTERNAL FIXATION (ORIF) RIGHT DISPLACED  DISTAL RADIUS  FRACTURE;  Surgeon: Dominica Severin, MD;  Location: [facility];  Service: Orthopedics;  Laterality: Right;  . PACEMAKER LEAD REMOVAL  04/12/2016   TEMPORARY PACEMAKER INSERTION  . PACEMAKER LEAD REMOVAL  04/15/2016   Procedure: Pacemaker Lead Removal;  Surgeon: Marinus Maw, MD;  Location: [facility] INVASIVE CV LAB;  Service: Cardiovascular;;  . PACEMAKER LEAD REMOVAL N/A 04/12/2016   Procedure: PACEMAKER LEAD REMOVAL;  Surgeon: Marinus Maw, MD;  Location: [facility] OR;  Service: Cardiovascular;  Laterality: N/A;  . PERMANENT PACEMAKER INSERTION Left 01/11/2012   Procedure: PERMANENT PACEMAKER INSERTION;  Surgeon: Thurmon Fair, MD;  Location: [facility] CATH LAB;  Service: Cardiovascular;  Laterality: Left;  . TEE WITHOUT CARDIOVERSION N/A 07/25/2015   Procedure: TRANSESOPHAGEAL ECHOCARDIOGRAM (TEE);  Surgeon: Pricilla Riffle, MD;  Location: [facility] ENDOSCOPY;  Service: Cardiovascular;  Laterality: N/A;     Inpatient Medications: Scheduled Meds: . allopurinol  300 mg Oral Daily  . amiodarone  200 mg Oral Daily  . apixaban  2.5 mg Oral BID  . atorvastatin  20 mg Oral q1800  . DULoxetine  20 mg Oral QHS  . insulin aspart  0-15 Units Subcutaneous Q4H  . insulin glargine  10 Units Subcutaneous Q2200  . isosorbide mononitrate  30 mg Oral Daily  . mouth rinse  15 mL Mouth Rinse BID  . metoprolol succinate  50 mg Oral BID  . pantoprazole  40 mg Oral Daily  . sodium chloride flush  3 mL Intravenous Q12H   Continuous Infusions:  PRN Meds: acetaminophen **OR** acetaminophen  Allergies:    Allergies  Allergen Reactions  . Beta Adrenergic Blockers Other (See Comments)    Couldn't speak or hear Metoprolol seems ok  . Jardiance [Empagliflozin] Other (See Comments)    Rash, kidney issues  . Spironolactone      Hyperkalemia    Social History:   Social History   Social History  . Marital status: Widowed    Spouse name: N/A  . Number of children: N/A  . Years of education: N/A   Occupational History  . Not on file.   Social History Main Topics  . Smoking status: Never Smoker  . Smokeless tobacco: Never Used  . Alcohol use No  . Drug use: No  . Sexual activity: No   Other Topics Concern  . Not on file   Social History Narrative  Physically inactive. She says she hurts in her back and knees too much to do much walking.   9th grade   Worked in multiple mills   Living with daughter    Doesn't drive as of 95/2841    Family History:   The patient's family history includes Breast cancer in her mother; Cancer in her brother and mother; Diabetes in her daughter; Hypertension in her daughter.  ROS:  Please see the history of present illness.  All other ROS reviewed and negative.     Physical Exam/Data:   Vitals:   04/02/17 1226 04/02/17 2048 04/03/17 0000 04/03/17 0416  BP: (!) 120/55 (!) 135/55 (!) 112/48 (!) 107/42  Pulse: 70 73 71 71  Resp: 20 20 20 20   Temp: 98.2 F (36.8 C) 97.5 F (36.4 C) 98.8 F (37.1 C) 98.9 F (37.2 C)  TempSrc: Oral Oral Oral Oral  SpO2: 100% 100% 99% 97%  Weight:    194 lb 9.6 oz (88.3 kg)  Height:        Intake/Output Summary (Last 24 hours) at 04/03/17 1000 Last data filed at 04/03/17 0950  Gross per 24 hour  Intake              960 ml  Output             1100 ml  Net             -140 ml   Filed Weights   04/01/17 0359 04/02/17 0435 04/03/17 0416  Weight: 193 lb 4.8 oz (87.7 kg) 194 lb 3.6 oz (88.1 kg) 194 lb 9.6 oz (88.3 kg)   Body mass index is 38.01 kg/m.  General: Well developed, well nourished obese WF, in no acute distress. Head: Normocephalic, atraumatic, sclera non-icteric, no xanthomas, nares are without discharge Neck: Negative for carotid bruits. JVD moderately elevated. No HJR Lungs: Clear bilaterally to auscultation  without wheezes, rales, or rhonchi. Breathing is unlabored. Heart: RRR with S1 S2. No murmurs, rubs, or gallops appreciated. Abdomen: Soft, non-tender, non-distended with normoactive bowel sounds. No hepatomegaly. No rebound/guarding. No obvious abdominal masses. Msk:  Strength and tone appear normal for age. Extremities: No clubbing or cyanosis. No edema.  Distal pedal pulses are 2+ and equal bilaterally. Neuro: Alert and oriented X 3. No facial asymmetry. No focal deficit. Moves all extremities spontaneously. Psych:  Responds to questions appropriately with a normal affect.  EKG:  The EKG was personally reviewed and demonstrates atrial pacing, QRS widening with bifasciular block (LBBB/RBBB), LVH, similar to prior  Relevant CV Studies: Referenced above  Laboratory Data:  Chemistry Recent Labs Lab 04/01/17 0454 04/02/17 0258 04/03/17 0426  NA 141 140 136  K 4.3 4.9 6.1*  CL 105 105 103  CO2 27 28 26   GLUCOSE 172* 81 148*  BUN 31* 28* 29*  CREATININE 1.64* 1.90* 2.03*  CALCIUM 8.5* 8.7* 8.8*  GFRNONAA 28* 23* 22*  GFRAA 32* 27* 25*  ANIONGAP 9 7 7      Recent Labs Lab 03/31/17 1132  PROT 7.1  ALBUMIN 2.5*  AST 70*  ALT 34  ALKPHOS 150*  BILITOT 1.4*   Hematology Recent Labs Lab 03/31/17 1132 03/31/17 1218 04/01/17 0454  WBC 8.8  --  9.1  RBC 4.08  --  3.81*  HGB 11.4* 13.3 10.8*  HCT 36.9 39.0 35.1*  MCV 90.4  --  92.1  MCH 27.9  --  28.3  MCHC 30.9  --  30.8  RDW 19.2*  --  19.8*  PLT 103*  --  103*   Cardiac EnzymesNo results for input(s): TROPONINI in the last 168 hours.  Recent Labs Lab 03/31/17 1216  TROPIPOC 0.00    BNP Recent Labs Lab 03/31/17 2000  BNP 398.4*    DDimer No results for input(s): DDIMER in the last 168 hours.  Radiology/Studies:  Ct Head Wo Contrast  Result Date: 03/31/2017 CLINICAL DATA:  Frontal headache.  Several falls over the past week. EXAM: CT HEAD WITHOUT CONTRAST CT CERVICAL SPINE WITHOUT CONTRAST TECHNIQUE:  Multidetector CT imaging of the head and cervical spine was performed following the standard protocol without intravenous contrast. Multiplanar CT image reconstructions of the cervical spine were also generated. COMPARISON:  01/08/2012 FINDINGS: CT HEAD FINDINGS Brain: No evidence of acute infarction, hemorrhage, extra-axial collection, ventriculomegaly, or mass effect. Generalized cerebral atrophy. Periventricular white matter low attenuation likely secondary to microangiopathy. Vascular: Cerebrovascular atherosclerotic calcifications are noted. Skull: Negative for fracture or focal lesion. Sinuses/Orbits: Visualized portions of the orbits are unremarkable. Visualized portions of the paranasal sinuses and mastoid air cells are unremarkable. Other: None. CT CERVICAL SPINE FINDINGS Alignment: Normal. Skull base and vertebrae: No acute fracture. No primary bone lesion or focal pathologic process. Soft tissues and spinal canal: No prevertebral fluid or swelling. No visible canal hematoma. Disc levels: Degenerative disc disease with disc height loss at C3-4, C4-5, C5-6 and C6-7. Mild bilateral facet arthropathy at C2-3. Moderate bilateral facet arthropathy and moderate bilateral foraminal stenosis at C3-4. Moderate bilateral facet arthropathy and severe right foraminal stenosis at C4-5. Moderate bilateral facet arthropathy and foraminal stenosis at C5-6. At C6-7 there is a broad-based disc osteophyte complex with left uncovertebral degenerative changes and moderate left facet arthropathy resulting in foraminal stenosis. Upper chest: Lung apices are clear. Other: No fluid collection or hematoma. Bilateral carotid artery atherosclerosis. IMPRESSION: 1. No acute intracranial pathology. 2. No acute osseous injury of the cervical spine. 3. Cervical spine spondylosis as described above. Electronically Signed   By: Elige Ko   On: 03/31/2017 12:31   Ct Cervical Spine Wo Contrast  Result Date: 03/31/2017 CLINICAL DATA:   Frontal headache.  Several falls over the past week. EXAM: CT HEAD WITHOUT CONTRAST CT CERVICAL SPINE WITHOUT CONTRAST TECHNIQUE: Multidetector CT imaging of the head and cervical spine was performed following the standard protocol without intravenous contrast. Multiplanar CT image reconstructions of the cervical spine were also generated. COMPARISON:  01/08/2012 FINDINGS: CT HEAD FINDINGS Brain: No evidence of acute infarction, hemorrhage, extra-axial collection, ventriculomegaly, or mass effect. Generalized cerebral atrophy. Periventricular white matter low attenuation likely secondary to microangiopathy. Vascular: Cerebrovascular atherosclerotic calcifications are noted. Skull: Negative for fracture or focal lesion. Sinuses/Orbits: Visualized portions of the orbits are unremarkable. Visualized portions of the paranasal sinuses and mastoid air cells are unremarkable. Other: None. CT CERVICAL SPINE FINDINGS Alignment: Normal. Skull base and vertebrae: No acute fracture. No primary bone lesion or focal pathologic process. Soft tissues and spinal canal: No prevertebral fluid or swelling. No visible canal hematoma. Disc levels: Degenerative disc disease with disc height loss at C3-4, C4-5, C5-6 and C6-7. Mild bilateral facet arthropathy at C2-3. Moderate bilateral facet arthropathy and moderate bilateral foraminal stenosis at C3-4. Moderate bilateral facet arthropathy and severe right foraminal stenosis at C4-5. Moderate bilateral facet arthropathy and foraminal stenosis at C5-6. At C6-7 there is a broad-based disc osteophyte complex with left uncovertebral degenerative changes and moderate left facet arthropathy resulting in foraminal stenosis. Upper chest: Lung apices are clear. Other: No fluid collection or hematoma. Bilateral  carotid artery atherosclerosis. IMPRESSION: 1. No acute intracranial pathology. 2. No acute osseous injury of the cervical spine. 3. Cervical spine spondylosis as described above.  Electronically Signed   By: Elige Ko   On: 03/31/2017 12:31   Mr Brain Wo Contrast (neuro Protocol)  Result Date: 03/31/2017 CLINICAL DATA:  Facial droop and slurred speech. History of memory loss, atrial fibrillation, dyslipidemia, pacemaker. EXAM: MRI HEAD WITHOUT CONTRAST TECHNIQUE: Multiplanar, multiecho pulse sequences of the brain and surrounding structures were obtained without intravenous contrast. COMPARISON:  CT HEAD March 31, 2017 at 1218 hours FINDINGS: BRAIN: No reduced diffusion to suggest acute ischemia. No susceptibility artifact to suggest hemorrhage. The ventricles and sulci are normal for patient's age. Patchy supratentorial white matter FLAIR T2 hyperintensities. Old tiny cerebellar infarcts. Old RIGHT thalamus lacunar infarct. Old RIGHT basal ganglia lacunar infarct. No suspicious parenchymal signal, masses or mass effect. No abnormal extra-axial fluid collections. VASCULAR: Normal major intracranial vascular flow voids present at skull base. SKULL AND UPPER CERVICAL SPINE: No abnormal sellar expansion. No suspicious calvarial bone marrow signal. Craniocervical junction maintained. Moderate degenerative change of the included cervical spine. SINUSES/ORBITS: Trace paranasal sinus mucosal thickening without air-fluid levels. Mastoid air cells are well aerated. The included ocular globes and orbital contents are non-suspicious. Status post bilateral ocular lens implants. OTHER: Patient is edentulous. IMPRESSION: No acute intracranial process. Mild to moderate chronic small vessel ischemic disease. Old RIGHT basal ganglia and thalamus lacunar infarcts and, old tiny cerebellar infarcts. Electronically Signed   By: Awilda Metro M.D.   On: 03/31/2017 20:49   Dg Chest Port 1 View  Result Date: 03/31/2017 CLINICAL DATA:  Onset of slurred speech at 8 a.m. today. History of CHF, COPD, coronary artery disease EXAM: PORTABLE CHEST 1 VIEW COMPARISON:  PA and lateral chest x-ray of March 17, 2017  FINDINGS: The lungs are adequately inflated and clear. The cardiac silhouette is enlarged. The pulmonary vascularity is not engorged. The mediastinum is normal in width. The ICD is in stable position. There degenerative changes of the shoulders. IMPRESSION: Cardiomegaly. No pulmonary edema, pneumonia, nor other acute cardiopulmonary abnormality. Electronically Signed   By: David  [place] M.D.   On: 03/31/2017 12:16    Assessment and Plan:   1. Weakness/slurred speech of unclear etiology 2. Acute on chronic diastolic CHF with prior LV dysfunction then resolution 3. Low-normal blood pressure 4. CKD stage IV 5. H/o PAF, atrial paced on telemetry 6. Deconditioning 7. Obesity  As above, complex patient with multiple admissions in the last 2 years. At this point she has been treated with diuresis with rising Cr to the top of her recent baseline and hyperkalemia. Weight has not changed significantly but symptomatically she feels better. Overall review of weight over the last two years indicates gradual 35-40lb weight gain which I suspect is partially due to dietary indiscretion and sedentary lifestyle. Will need nutrition consult for further reinforcement. I will review further workup with Dr. Shirlee Latch.   Signed, Laurann Montana, PA-C  04/03/2017 10:00 AM  Patient seen with PA, agree with the above note.  1. Acute on chronic diastolic CHF: Recent echo with EF 60-65%.  She appears mildly volume overloaded on exam.  She has not diuresed much and creatinine is 2.  However, this is really not far off her historical baseline.  - Would give IV Lasix today, 80 mg IV bid.  Possible transition to po tomorrow.  - Volume getting more difficult to manage and exam difficult.  Will try to  arrange for Cardiomems placement as outpatient.  2. Generalized weakness: BP soft, will stop Imdur and decrease Toprol XL to 25 mg bid.  3. 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  Creatinine 2 today, not far off baseline.  Will give IV Lasix today and  follow.  4. Atrial fibrillation: Paroxysmal.  She appears to be a-paced (not atrial fibrillation).  Continue apixaban.    [name] 04/03/2017 10:28 AM

## 2017-04-03 NOTE — Progress Notes (Addendum)
PROGRESS NOTE    Danielle Benitez  RUE:[id number] DOB: 1932-01-11 DOA: 03/31/2017 PCP: Joaquim Nam, MD    Brief Narrative:   83 y.o. female with a past medical history significant for HTN, iron-def anemia, bradycardia with pacer, Afib on Eliquis, HFpEF and CKD III who presents with disparate complaints though mainly swelling for a few days and 5lb weight gain despite increased diuretics and then this morning generalized weakness and slurred speech  Assessment & Plan:   1. Weakness:  Unclear etiology.  Most likely related to her weight gain/very mild CHF flare and generalized debility from lack of activity.   - MRI did not report any reasons for weakness - Physical therapy evaluation = home health PT  2. Acute diastolic CHF:  Mild.  Weight reportedly up 5 lbs.   - Was on IV Lasix. Patients weight up despite being on lasix 80 mg IV BID yesterday. Serum creatinine trending up and I suspect baseline of S creatinine is < 2  - Weight is still at 88 kg today. Despite administration of Lasix 80 mg IV yesterday. Given elevation in serum creatinine, soft blood pressures will consult Cardiology for assistance with management of diuresis. Besides patient being more edematous she is also complaining of increased sob with activity.   Addendum: discontinued lasix for now until patient is evaluated by cardiology as I would like to defer diuresis regimen to them. Also at this moment her last BP reported at 107/42 and I am concerned about hypotension if I give her IV lasix at this juncture.  3. Elevated LFTs:  Stable,  At baseline  4. Atrial fibrillation:  CHADS2Vasc 5.  - anticoagulated with Eliquis - continue rate control with amiodarone  5. Iron deficiency anemia:  Stable, hgb at 10.8 on last check  6. Insulin dependent diabetes:  -continue Glargine -Continue SSI  - Continue diabetic diet.  7. Hypertension:  -Continue statin, Imdur, metoprolol  8. CKD IV: -Daily BMP - Serum  creatinine slightly up  9. Nausea - resolved, unclear etiology ?worsening renal function  10. Hyperkalemia - reassess K level as there was hemolysis visible on sample - for the moment held K replacement and administered small dose of kayexalate.    DVT prophylaxis: Eliquis Code Status: Full Family Communication: None at bedside  Disposition Plan: pending improvement in respiratory condition back to baseline with activity after diuresis.    Consultants:   Cardiology   Procedures: None   Antimicrobials: None   Subjective: Pt states she has had sob with activity and notices swelling. No chest pain. No new problems reported otherwise.   Objective: Vitals:   04/02/17 1226 04/02/17 2048 04/03/17 0000 04/03/17 0416  BP: (!) 120/55 (!) 135/55 (!) 112/48 (!) 107/42  Pulse: 70 73 71 71  Resp: 20 20 20 20   Temp: 98.2 F (36.8 C) 97.5 F (36.4 C) 98.8 F (37.1 C) 98.9 F (37.2 C)  TempSrc: Oral Oral Oral Oral  SpO2: 100% 100% 99% 97%  Weight:    88.3 kg (194 lb 9.6 oz)  Height:        Intake/Output Summary (Last 24 hours) at 04/03/17 0910 Last data filed at 04/03/17 0700  Gross per 24 hour  Intake              960 ml  Output             1100 ml  Net             -140 ml  Filed Weights   04/01/17 0359 04/02/17 0435 04/03/17 0416  Weight: 87.7 kg (193 lb 4.8 oz) 88.1 kg (194 lb 3.6 oz) 88.3 kg (194 lb 9.6 oz)    Examination:  General exam:  alert and awake, Patient in no acute distress, Respiratory system: no increased wob at rest, equal chest rise, no wheezes Cardiovascular system: S1 & S2 heard, RRR. No  murmurs, positive edema non pitting Gastrointestinal system: Abdomen is nondistended, soft and nontender. No organomegaly or masses felt. Normal bowel sounds heard.No guarding Central nervous system: No focal neurological deficits, Alert and oriented.  Extremities: Symmetric 5 x 5 power. Skin: on limited exam No rashes, lesions or ulcers Psychiatry: affect and  mood appropriate.   Data Reviewed: I have personally reviewed following labs and imaging studies  CBC:  Recent Labs Lab 03/31/17 1132 03/31/17 1218 04/01/17 0454  WBC 8.8  --  9.1  NEUTROABS 6.9  --   --   HGB 11.4* 13.3 10.8*  HCT 36.9 39.0 35.1*  MCV 90.4  --  92.1  PLT 103*  --  103*   Basic Metabolic Panel:  Recent Labs Lab 03/31/17 0018 03/31/17 1132 03/31/17 1218 04/01/17 0454 04/02/17 0258 04/03/17 0426  NA  --  137 140 141 140 136  K  --  4.2 4.2 4.3 4.9 6.1*  CL  --  101 101 105 105 103  CO2  --  26  --  27 28 26   GLUCOSE  --  203* 195* 172* 81 148*  BUN  --  27* 35* 31* 28* 29*  CREATININE  --  1.79* 1.60* 1.64* 1.90* 2.03*  CALCIUM  --  8.8*  --  8.5* 8.7* 8.8*  MG 2.0  --   --   --   --   --    GFR: Estimated Creatinine Clearance: 20.8 mL/min (A) (by C-G formula based on SCr of 2.03 mg/dL (H)). Liver Function Tests:  Recent Labs Lab 03/31/17 1132  AST 70*  ALT 34  ALKPHOS 150*  BILITOT 1.4*  PROT 7.1  ALBUMIN 2.5*   No results for input(s): LIPASE, AMYLASE in the last 168 hours. No results for input(s): AMMONIA in the last 168 hours. Coagulation Profile:  Recent Labs Lab 03/31/17 1132  INR 1.54   Cardiac Enzymes: No results for input(s): CKTOTAL, CKMB, CKMBINDEX, TROPONINI in the last 168 hours. BNP (last 3 results)  Recent Labs  09/20/16 1458 03/16/17 1441  PROBNP 140.0* 287.0*   HbA1C: No results for input(s): HGBA1C in the last 72 hours. CBG:  Recent Labs Lab 04/02/17 1624 04/02/17 2019 04/02/17 2356 04/03/17 0414 04/03/17 0807  GLUCAP 281* 353* 113* 137* 169*   Lipid Profile: No results for input(s): CHOL, HDL, LDLCALC, TRIG, CHOLHDL, LDLDIRECT in the last 72 hours. Thyroid Function Tests: No results for input(s): TSH, T4TOTAL, FREET4, T3FREE, THYROIDAB in the last 72 hours. Anemia Panel: No results for input(s): VITAMINB12, FOLATE, FERRITIN, TIBC, IRON, RETICCTPCT in the last 72 hours. Sepsis Labs: No results  for input(s): PROCALCITON, LATICACIDVEN in the last 168 hours.  No results found for this or any previous visit (from the past 240 hour(s)).       Radiology Studies: No results found.      Scheduled Meds: . allopurinol  300 mg Oral Daily  . amiodarone  200 mg Oral Daily  . apixaban  2.5 mg Oral BID  . atorvastatin  20 mg Oral q1800  . DULoxetine  20 mg Oral QHS  . insulin  aspart  0-15 Units Subcutaneous Q4H  . insulin glargine  10 Units Subcutaneous Q2200  . isosorbide mononitrate  30 mg Oral Daily  . mouth rinse  15 mL Mouth Rinse BID  . metoprolol succinate  50 mg Oral BID  . pantoprazole  40 mg Oral Daily  . sodium chloride flush  3 mL Intravenous Q12H   Continuous Infusions:   LOS: 0 days    Time spent: > 35 minutes  Penny Pia, MD [facility] Pager [phone]  If 7PM-7AM, please contact night-coverage [email or web address] Password [email or web address] 04/03/2017, 9:10 AM

## 2017-04-03 NOTE — Progress Notes (Signed)
Patient's daughter is concerned that patient is weak and has a lot less energy today than yesterday.  Daughter is crying at the bedside saying it is hard to see her mom like this.  Dr. Cena Benton paged who called and spoke to the daughter over the phone.  Will continue to monitor.

## 2017-04-04 LAB — BASIC METABOLIC PANEL
ANION GAP: 10 (ref 5–15)
BUN: 28 mg/dL — ABNORMAL HIGH (ref 6–20)
CALCIUM: 8.9 mg/dL (ref 8.9–10.3)
CO2: 26 mmol/L (ref 22–32)
Chloride: 102 mmol/L (ref 101–111)
Creatinine, Ser: 2.1 mg/dL — ABNORMAL HIGH (ref 0.44–1.00)
GFR calc Af Amer: 24 mL/min — ABNORMAL LOW (ref >60)
GFR calc non Af Amer: 21 mL/min — ABNORMAL LOW (ref >60)
GLUCOSE: 129 mg/dL — AB (ref 65–99)
Potassium: 3.9 mmol/L (ref 3.5–5.1)
Sodium: 138 mmol/L (ref 135–145)

## 2017-04-04 LAB — GLUCOSE, CAPILLARY
GLUCOSE-CAPILLARY: 126 mg/dL — AB (ref 65–99)
GLUCOSE-CAPILLARY: 213 mg/dL — AB (ref 65–99)
GLUCOSE-CAPILLARY: 263 mg/dL — AB (ref 65–99)
Glucose-Capillary: 136 mg/dL — ABNORMAL HIGH (ref 65–99)
Glucose-Capillary: 132 mg/dL — ABNORMAL HIGH (ref 65–99)
Glucose-Capillary: 212 mg/dL — ABNORMAL HIGH (ref 65–99)

## 2017-04-04 LAB — CBC
HCT: 34.8 % — ABNORMAL LOW (ref 36.0–46.0)
HEMOGLOBIN: 10.6 g/dL — AB (ref 12.0–15.0)
MCH: 27.5 pg (ref 26.0–34.0)
MCHC: 30.5 g/dL (ref 30.0–36.0)
MCV: 90.4 fL (ref 78.0–100.0)
Platelets: 100 10*3/uL — ABNORMAL LOW (ref 150–400)
RBC: 3.85 MIL/uL — ABNORMAL LOW (ref 3.87–5.11)
RDW: 19.3 % — ABNORMAL HIGH (ref 11.5–15.5)
WBC: 8.5 10*3/uL (ref 4.0–10.5)

## 2017-04-04 MED ORDER — TRAZODONE HCL 50 MG PO TABS: 50.0000 mg | ORAL_TABLET | Freq: Once | ORAL | Status: DC

## 2017-04-04 MED ORDER — PREMIER PROTEIN SHAKE: 11.0000 [oz_av] | Freq: Every day | ORAL | Status: DC

## 2017-04-04 MED ORDER — FUROSEMIDE 40 MG PO TABS
40.0000 mg | ORAL_TABLET | Freq: Every evening | ORAL | Status: DC
  Administered 2017-04-04 – 2017-04-05 (×2): 40 mg via ORAL
  Filled 2017-04-04 (×2): qty 1

## 2017-04-04 MED ORDER — FUROSEMIDE 80 MG PO TABS
80.0000 mg | ORAL_TABLET | Freq: Every day | ORAL | Status: DC
  Administered 2017-04-04 – 2017-04-05 (×2): 80 mg via ORAL
  Filled 2017-04-04 (×2): qty 1

## 2017-04-04 NOTE — Progress Notes (Signed)
Physical Therapy Treatment Patient Details Name: Danielle Benitez MRN: [id number] DOB: 1932/06/26 Today's Date: 04/04/2017    History of Present Illness Pt is a 83 yo female with complants of weakness, SoB, LE swelling, and diaphoresis secondary to diastolic CHF. PMH significant for HTN, iron-def anemia, bradycardia with pacer, Afib on Eliquis, HFpEF and CKD III     PT Comments    Pt is making progress towards her goal. Pt currently supervision for bed mobility, min guard for transfers and minA for ambulation of 50 feet with RW. Pt is limited by weakness and fatigues easily. Pt will benefit from continued physical therapy in the acute setting and HHPT at d/c to continue gait training and to improve LE strength and balance to be safe in her home environment.      Follow Up Recommendations  Home health PT;Supervision/Assistance - 24 hour     Equipment Recommendations  None recommended by PT    Recommendations for Other Services       Precautions / Restrictions Precautions Precautions: Fall Restrictions Weight Bearing Restrictions: No    Mobility  Bed Mobility Overal bed mobility: Needs Assistance Bed Mobility: Supine to Sit     Supine to sit: Supervision     General bed mobility comments: supervision for safety  Transfers Overall transfer level: Needs assistance   Transfers: Sit to/from Stand Sit to Stand: Min guard         General transfer comment: min guard for safety, slow ascent to upright, good eccentric descent back to bed  Ambulation/Gait Ambulation/Gait assistance: Min assist Ambulation Distance (Feet): 50 Feet Assistive device: Rolling walker (2 wheeled) Gait Pattern/deviations: Step-through pattern;Decreased step length - right;Decreased step length - left;Antalgic;Trunk flexed;Decreased weight shift to right Gait velocity: slowed Gait velocity interpretation: Below normal speed for age/gender General Gait Details: minA for steadying verbal and tactile  cues for staying within walker and upright posture       Balance Overall balance assessment: Needs assistance Sitting-balance support: No upper extremity supported;Feet supported Sitting balance-Leahy Scale: Good Sitting balance - Comments: able to maintain balance while moving bed back   Standing balance support: Bilateral upper extremity supported Standing balance-Leahy Scale: Fair Standing balance comment: requires RW for balance                            Cognition Arousal/Alertness: Awake/alert Behavior During Therapy: WFL for tasks assessed/performed Overall Cognitive Status: Within Functional Limits for tasks assessed                                           General Comments General comments (skin integrity, edema, etc.): SaO2 on RA with ambulation 94%O2, HR 77bpm      Pertinent Vitals/Pain Pain Assessment: 0-10 Pain Score: 7  Pain Location: R knee, back Pain Descriptors / Indicators: Constant;Aching Pain Intervention(s): Monitored during session;Limited activity within patient's tolerance;Repositioned    Home Living Family/patient expects to be discharged to:: Private residence Living Arrangements: Children Available Help at Discharge: Family;Available 24 hours/day Type of Home: Apartment Home Access: Level entry   Home Layout: One level Home Equipment: Walker - 2 wheels;Cane - quad;Walker - 4 wheels;Shower seat      Prior Function Level of Independence: Needs assistance  Gait / Transfers Assistance Needed: RW for ambulation  ADL's / Homemaking Assistance Needed: assist with bathing  PT Goals (current goals can now be found in the care plan section) Acute Rehab PT Goals Patient Stated Goal: go back to church PT Goal Formulation: With patient Time For Goal Achievement: 04/15/17 Potential to Achieve Goals: Fair Progress towards PT goals: Progressing toward goals    Frequency    Min 3X/week      PT Plan Current  plan remains appropriate       AM-PAC PT "6 Clicks" Daily Activity  Outcome Measure  Difficulty turning over in bed (including adjusting bedclothes, sheets and blankets)?: A Lot Difficulty moving from lying on back to sitting on the side of the bed? : Total Difficulty sitting down on and standing up from a chair with arms (e.g., wheelchair, bedside commode, etc,.)?: A Lot Help needed moving to and from a bed to chair (including a wheelchair)?: A Little Help needed walking in hospital room?: A Little Help needed climbing 3-5 steps with a railing? : Total 6 Click Score: 12    End of Session Equipment Utilized During Treatment: Gait belt Activity Tolerance: Patient limited by fatigue;Patient limited by pain (limited by R knee pain) Patient left: with call bell/phone within reach;in bed;with family/visitor present Nurse Communication: Mobility status PT Visit Diagnosis: Unsteadiness on feet (R26.81);Other abnormalities of gait and mobility (R26.89);Muscle weakness (generalized) (M62.81);Difficulty in walking, not elsewhere classified (R26.2);Pain Pain - Right/Left: Right Pain - part of body: Knee     Time: 1610-9604 PT Time Calculation (min) (ACUTE ONLY): 27 min  Charges:  $Gait Training: 8-22 mins $Therapeutic Activity: 8-22 mins                    G Codes:  Functional Assessment Tool Used: AM-PAC 6 Clicks Basic Mobility Functional Limitation: Mobility: Walking and moving around Mobility: Walking and Moving Around Current Status (V4098): At least 60 percent but less than 80 percent impaired, limited or restricted Mobility: Walking and Moving Around Goal Status [phone]): At least 20 percent but less than 40 percent impaired, limited or restricted    Danielle Manis B. Beverely Risen PT, DPT Acute Rehabilitation  [phone] Pager [phone]     Elon Alas Fleet 04/04/2017, 2:36 PM

## 2017-04-04 NOTE — Progress Notes (Signed)
SUBJECTIVE: Feels weak, did not get out of bed yesterday.  Diuresed well, weight down.  Creatinine fairly stable at 2.1.  Scheduled Meds: . allopurinol  300 mg Oral Daily  . amiodarone  200 mg Oral Daily  . apixaban  2.5 mg Oral BID  . atorvastatin  20 mg Oral q1800  . DULoxetine  20 mg Oral QHS  . furosemide  40 mg Oral QPM  . furosemide  80 mg Oral Q breakfast  . insulin aspart  0-15 Units Subcutaneous Q4H  . insulin glargine  10 Units Subcutaneous Q2200  . mouth rinse  15 mL Mouth Rinse BID  . metoprolol succinate  25 mg Oral BID  . pantoprazole  40 mg Oral Daily  . sodium chloride flush  3 mL Intravenous Q12H  . traZODone  50 mg Oral Once   Continuous Infusions: PRN Meds:.acetaminophen **OR** acetaminophen, neomycin-bacitracin-polymyxin    Vitals:   04/03/17 0416 04/03/17 1206 04/03/17 2015 04/04/17 0503  BP: (!) 107/42 (!) 116/51 116/62 (!) 105/44  Pulse: 71 70 70 71  Resp: 20 16 16 16   Temp: 98.9 F (37.2 C) 98.8 F (37.1 C) 98.5 F (36.9 C) 98.1 F (36.7 C)  TempSrc: Oral Oral Oral Oral  SpO2: 97% 98% 99% 98%  Weight: 194 lb 9.6 oz (88.3 kg)   191 lb 4.8 oz (86.8 kg)  Height:        Intake/Output Summary (Last 24 hours) at 04/04/17 0814 Last data filed at 04/04/17 0504  Gross per 24 hour  Intake              963 ml  Output             1900 ml  Net             -937 ml    LABS: Basic Metabolic Panel:  Recent Labs  40/98/11 0426 04/03/17 0911 04/04/17 0321  NA 136  --  138  K 6.1* 5.8* 3.9  CL 103  --  102  CO2 26  --  26  GLUCOSE 148*  --  129*  BUN 29*  --  28*  CREATININE 2.03*  --  2.10*  CALCIUM 8.8*  --  8.9   Liver Function Tests: No results for input(s): AST, ALT, ALKPHOS, BILITOT, PROT, ALBUMIN in the last 72 hours. No results for input(s): LIPASE, AMYLASE in the last 72 hours. CBC:  Recent Labs  04/04/17 0321  WBC 8.5  HGB 10.6*  HCT 34.8*  MCV 90.4  PLT 100*   Cardiac Enzymes: No results for input(s): CKTOTAL, CKMB,  CKMBINDEX, TROPONINI in the last 72 hours. BNP: Invalid input(s): POCBNP D-Dimer: No results for input(s): DDIMER in the last 72 hours. Hemoglobin A1C: No results for input(s): HGBA1C in the last 72 hours. Fasting Lipid Panel: No results for input(s): CHOL, HDL, LDLCALC, TRIG, CHOLHDL, LDLDIRECT in the last 72 hours. Thyroid Function Tests: No results for input(s): TSH, T4TOTAL, T3FREE, THYROIDAB in the last 72 hours.  Invalid input(s): FREET3 Anemia Panel: No results for input(s): VITAMINB12, FOLATE, FERRITIN, TIBC, IRON, RETICCTPCT in the last 72 hours.  RADIOLOGY: Dg Chest 2 View  Result Date: 03/17/2017 CLINICAL DATA:  Heart failure EXAM: CHEST  2 VIEW COMPARISON:  02/14/2017 FINDINGS: Chronic cardiomegaly. Dual-chamber pacer leads from the right in unremarkable position. There is no edema, consolidation, effusion, or pneumothorax. Glenohumeral osteoarthritis and thoracic disc degeneration and spondylosis. IMPRESSION: No evidence of active disease.  Cardiomegaly without edema. Electronically Signed  By: Marnee Spring M.D.   On: 03/17/2017 14:48   Ct Head Wo Contrast  Result Date: 03/31/2017 CLINICAL DATA:  Frontal headache.  Several falls over the past week. EXAM: CT HEAD WITHOUT CONTRAST CT CERVICAL SPINE WITHOUT CONTRAST TECHNIQUE: Multidetector CT imaging of the head and cervical spine was performed following the standard protocol without intravenous contrast. Multiplanar CT image reconstructions of the cervical spine were also generated. COMPARISON:  01/08/2012 FINDINGS: CT HEAD FINDINGS Brain: No evidence of acute infarction, hemorrhage, extra-axial collection, ventriculomegaly, or mass effect. Generalized cerebral atrophy. Periventricular white matter low attenuation likely secondary to microangiopathy. Vascular: Cerebrovascular atherosclerotic calcifications are noted. Skull: Negative for fracture or focal lesion. Sinuses/Orbits: Visualized portions of the orbits are unremarkable.  Visualized portions of the paranasal sinuses and mastoid air cells are unremarkable. Other: None. CT CERVICAL SPINE FINDINGS Alignment: Normal. Skull base and vertebrae: No acute fracture. No primary bone lesion or focal pathologic process. Soft tissues and spinal canal: No prevertebral fluid or swelling. No visible canal hematoma. Disc levels: Degenerative disc disease with disc height loss at C3-4, C4-5, C5-6 and C6-7. Mild bilateral facet arthropathy at C2-3. Moderate bilateral facet arthropathy and moderate bilateral foraminal stenosis at C3-4. Moderate bilateral facet arthropathy and severe right foraminal stenosis at C4-5. Moderate bilateral facet arthropathy and foraminal stenosis at C5-6. At C6-7 there is a broad-based disc osteophyte complex with left uncovertebral degenerative changes and moderate left facet arthropathy resulting in foraminal stenosis. Upper chest: Lung apices are clear. Other: No fluid collection or hematoma. Bilateral carotid artery atherosclerosis. IMPRESSION: 1. No acute intracranial pathology. 2. No acute osseous injury of the cervical spine. 3. Cervical spine spondylosis as described above. Electronically Signed   By: Elige Ko   On: 03/31/2017 12:31   Ct Cervical Spine Wo Contrast  Result Date: 03/31/2017 CLINICAL DATA:  Frontal headache.  Several falls over the past week. EXAM: CT HEAD WITHOUT CONTRAST CT CERVICAL SPINE WITHOUT CONTRAST TECHNIQUE: Multidetector CT imaging of the head and cervical spine was performed following the standard protocol without intravenous contrast. Multiplanar CT image reconstructions of the cervical spine were also generated. COMPARISON:  01/08/2012 FINDINGS: CT HEAD FINDINGS Brain: No evidence of acute infarction, hemorrhage, extra-axial collection, ventriculomegaly, or mass effect. Generalized cerebral atrophy. Periventricular white matter low attenuation likely secondary to microangiopathy. Vascular: Cerebrovascular atherosclerotic  calcifications are noted. Skull: Negative for fracture or focal lesion. Sinuses/Orbits: Visualized portions of the orbits are unremarkable. Visualized portions of the paranasal sinuses and mastoid air cells are unremarkable. Other: None. CT CERVICAL SPINE FINDINGS Alignment: Normal. Skull base and vertebrae: No acute fracture. No primary bone lesion or focal pathologic process. Soft tissues and spinal canal: No prevertebral fluid or swelling. No visible canal hematoma. Disc levels: Degenerative disc disease with disc height loss at C3-4, C4-5, C5-6 and C6-7. Mild bilateral facet arthropathy at C2-3. Moderate bilateral facet arthropathy and moderate bilateral foraminal stenosis at C3-4. Moderate bilateral facet arthropathy and severe right foraminal stenosis at C4-5. Moderate bilateral facet arthropathy and foraminal stenosis at C5-6. At C6-7 there is a broad-based disc osteophyte complex with left uncovertebral degenerative changes and moderate left facet arthropathy resulting in foraminal stenosis. Upper chest: Lung apices are clear. Other: No fluid collection or hematoma. Bilateral carotid artery atherosclerosis. IMPRESSION: 1. No acute intracranial pathology. 2. No acute osseous injury of the cervical spine. 3. Cervical spine spondylosis as described above. Electronically Signed   By: Elige Ko   On: 03/31/2017 12:31   Mr Brain Wo Contrast (neuro  Protocol)  Result Date: 03/31/2017 CLINICAL DATA:  Facial droop and slurred speech. History of memory loss, atrial fibrillation, dyslipidemia, pacemaker. EXAM: MRI HEAD WITHOUT CONTRAST TECHNIQUE: Multiplanar, multiecho pulse sequences of the brain and surrounding structures were obtained without intravenous contrast. COMPARISON:  CT HEAD March 31, 2017 at 1218 hours FINDINGS: BRAIN: No reduced diffusion to suggest acute ischemia. No susceptibility artifact to suggest hemorrhage. The ventricles and sulci are normal for patient's age. Patchy supratentorial white  matter FLAIR T2 hyperintensities. Old tiny cerebellar infarcts. Old RIGHT thalamus lacunar infarct. Old RIGHT basal ganglia lacunar infarct. No suspicious parenchymal signal, masses or mass effect. No abnormal extra-axial fluid collections. VASCULAR: Normal major intracranial vascular flow voids present at skull base. SKULL AND UPPER CERVICAL SPINE: No abnormal sellar expansion. No suspicious calvarial bone marrow signal. Craniocervical junction maintained. Moderate degenerative change of the included cervical spine. SINUSES/ORBITS: Trace paranasal sinus mucosal thickening without air-fluid levels. Mastoid air cells are well aerated. The included ocular globes and orbital contents are non-suspicious. Status post bilateral ocular lens implants. OTHER: Patient is edentulous. IMPRESSION: No acute intracranial process. Mild to moderate chronic small vessel ischemic disease. Old RIGHT basal ganglia and thalamus lacunar infarcts and, old tiny cerebellar infarcts. Electronically Signed   By: Awilda Metro M.D.   On: 03/31/2017 20:49   Dg Chest Port 1 View  Result Date: 03/31/2017 CLINICAL DATA:  Onset of slurred speech at 8 a.m. today. History of CHF, COPD, coronary artery disease EXAM: PORTABLE CHEST 1 VIEW COMPARISON:  PA and lateral chest x-ray of March 17, 2017 FINDINGS: The lungs are adequately inflated and clear. The cardiac silhouette is enlarged. The pulmonary vascularity is not engorged. The mediastinum is normal in width. The ICD is in stable position. There degenerative changes of the shoulders. IMPRESSION: Cardiomegaly. No pulmonary edema, pneumonia, nor other acute cardiopulmonary abnormality. Electronically Signed   By: David  [place] M.D.   On: 03/31/2017 12:16    PHYSICAL EXAM General: NAD Neck: No JVD, no thyromegaly or thyroid nodule.  Lungs: Clear to auscultation bilaterally with normal respiratory effort. CV: Nondisplaced PMI.  Heart regular S1/S2, no S3/S4, no murmur.  No peripheral edema.    Abdomen: Soft, nontender, no hepatosplenomegaly, no distention.  Neurologic: Alert and oriented x 3.  Psych: Normal affect. Extremities: No clubbing or cyanosis.   TELEMETRY: Reviewed telemetry pt in a-paced, v-sensed  ASSESSMENT AND PLAN: 1. Acute on chronic diastolic CHF: Recent echo with EF 60-65%.  She diuresed better yesterday, weight down 3 lbs.  She does not look particularly volume overloaded at this point.   - Stop IV Lasix, start Lasix 80 qam/40 qpm.  - Volume getting more difficult to manage and exam difficult.  Will try to arrange for Cardiomems placement as outpatient.  2. Generalized weakness: BP soft, stopped Imdur and decreased Toprol XL to 25 mg bid.  Will have PT and cardiac rehab try to mobilize her today.   3. 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  Creatinine 2.1 today, not far off baseline.  Stopping IV Lasix.  4. Atrial fibrillation: Paroxysmal.  She appears to be a-paced (not atrial fibrillation).  Continue apixaban.    [name] 04/04/2017  8:17 AM

## 2017-04-04 NOTE — Progress Notes (Signed)
CARDIAC REHAB PHASE I   PRE:  Rate/Rhythm: 70 paced  BP:  Supine:   Sitting: 123/53  Standing:    SaO2: 97%RA  MODE:  Ambulation: 40 ft   POST:  Rate/Rhythm: 77  BP:  Supine:   Sitting: 167/70  Standing:    SaO2: 99%RA 1310-1342 Pt willing to walk. Pt assisted to [facility] and then walked 40 ft on RA with gait belt use, rolling walker and asst x 2 with unsteady gait. Pt encouraged to stand upright as she bends over when walking. Tired very easily. Deconditioned. PT to see today. Assisted back to bed. Needs much assistance. Will see pt as time permits as more appropriate for PT.   Luetta Nutting, RN BSN  04/04/2017 1:40 PM

## 2017-04-04 NOTE — Plan of Care (Signed)
Problem: Food- and Nutrition-Related Knowledge Deficit (NB-1.1) Goal: Nutrition education Formal process to instruct or train a patient/client in a skill or to impart knowledge to help patients/clients voluntarily manage or modify food choices and eating behavior to maintain or improve health.  Outcome: Completed/Met Date Met: 04/04/17 Nutrition Education Note  RD consulted for nutrition education regarding CHF.   RD provided "Low Sodium Nutrition Therapy" handout from the [employer] to pt and her family. Reviewed patient's dietary recall. Provided examples on ways to decrease sodium intake in diet. Discouraged intake of processed foods and use of salt shaker. Encouraged fresh fruits and vegetables as well as whole grain sources of carbohydrates to maximize fiber intake.   RD discussed why it is important for patient to adhere to diet recommendations, and emphasized the role of fluids, foods to avoid, and importance of weighing self daily. Pt currently follows a diet relatively high in sodium, using the salt shaker frequently but has good support from her family and appears to ready to make changes. Teach back method used.  Expect good compliance.  Body mass index is 37.36 kg/m. Pt meets criteria for obesity unspecified based on current BMI.  Current diet order is Heart Healthy/Carb Modified, patient is consuming approximately 83% of meals at this time. Pt does drink Premier Protein at home, ordered daily for pt. Labs and medications reviewed. No further nutrition interventions warranted at this time. RD contact information provided. If additional nutrition issues arise, please re-consult RD.   Kerman Passey MS, RD, LDN [phone] Pager  [phone] Weekend/On-Call Pager

## 2017-04-04 NOTE — Progress Notes (Signed)
PROGRESS NOTE    Danielle Benitez  ZOX:[id number] DOB: Jan 10, 1932 DOA: 03/31/2017 PCP: Joaquim Nam, MD    Brief Narrative:   83 y.o. female with a past medical history significant for HTN, iron-def anemia, bradycardia with pacer, Afib on Eliquis, HFpEF and CKD III who presents with disparate complaints though mainly swelling for a few days and 5lb weight gain despite increased diuretics and then this morning generalized weakness and slurred speech  Assessment & Plan:   1. Weakness:  Unclear etiology.  Most likely related to her weight gain/very mild CHF flare and generalized debility from lack of activity.   - MRI did not report any reasons for weakness - Worsening weakness suspected to be secondary to soft blood pressures. Improved with discontinuation of BP medication. PT to see patient again  2. Acute diastolic CHF:  Mild.  Weight reportedly up 5 lbs.   - Cardiology assisting with diuresis  3. Elevated LFTs:  Stable,  At baseline  4. Atrial fibrillation:  CHADS2Vasc 5.  - anticoagulated with Eliquis - continue rate on amiodarone  5. Iron deficiency anemia:  Stable, hgb at 10.8 on last check  6. Insulin dependent diabetes:  -Glargine -Continue SSI  - diabetic diet.  7. Hypertension:  -Continue pt  metoprolol  8. CKD IV: - Daily BMP -  Serum creatinine slightly up  9. Nausea - resolved, unclear etiology worsening renal function  10. Hyperkalemia - resolved   DVT prophylaxis: Eliquis Code Status: Full Family Communication: None at bedside  Disposition Plan: pending improvement in respiratory condition back to baseline with activity after diuresis.    Consultants:   Cardiology   Procedures: None   Antimicrobials: None   Subjective: Pt has no new complaints. No acute issues overnight. States she feels more alert and better today.  Objective: Vitals:   04/03/17 0416 04/03/17 1206 04/03/17 2015 04/04/17 0503  BP: (!) 107/42 (!) 116/51  116/62 (!) 105/44  Pulse: 71 70 70 71  Resp: 20 16 16 16   Temp: 98.9 F (37.2 C) 98.8 F (37.1 C) 98.5 F (36.9 C) 98.1 F (36.7 C)  TempSrc: Oral Oral Oral Oral  SpO2: 97% 98% 99% 98%  Weight: 88.3 kg (194 lb 9.6 oz)   86.8 kg (191 lb 4.8 oz)  Height:        Intake/Output Summary (Last 24 hours) at 04/04/17 1606 Last data filed at 04/04/17 1300  Gross per 24 hour  Intake              480 ml  Output             1900 ml  Net            -1420 ml   Filed Weights   04/02/17 0435 04/03/17 0416 04/04/17 0503  Weight: 88.1 kg (194 lb 3.6 oz) 88.3 kg (194 lb 9.6 oz) 86.8 kg (191 lb 4.8 oz)    Examination: exam unchanged from 04/03/17  General exam:  alert and awake, Patient in no acute distress, Respiratory system: no increased wob at rest, equal chest rise, no wheezes Cardiovascular system: S1 & S2 heard, RRR. No  murmurs, positive edema non pitting Gastrointestinal system: Abdomen is nondistended, soft and nontender. No organomegaly or masses felt. Normal bowel sounds heard.No guarding Central nervous system: No focal neurological deficits, Alert and oriented.  Extremities: Symmetric 5 x 5 power. Skin: on limited exam No rashes, lesions or ulcers Psychiatry: affect and mood appropriate.   Data Reviewed: I have  personally reviewed following labs and imaging studies  CBC:  Recent Labs Lab 03/31/17 1132 03/31/17 1218 04/01/17 0454 04/04/17 0321  WBC 8.8  --  9.1 8.5  NEUTROABS 6.9  --   --   --   HGB 11.4* 13.3 10.8* 10.6*  HCT 36.9 39.0 35.1* 34.8*  MCV 90.4  --  92.1 90.4  PLT 103*  --  103* 100*   Basic Metabolic Panel:  Recent Labs Lab 03/31/17 0018  03/31/17 1132 03/31/17 1218 04/01/17 0454 04/02/17 0258 04/03/17 0426 04/03/17 0911 04/04/17 0321  NA  --   < > 137 140 141 140 136  --  138  K  --   < > 4.2 4.2 4.3 4.9 6.1* 5.8* 3.9  CL  --   < > 101 101 105 105 103  --  102  CO2  --   --  26  --  27 28 26   --  26  GLUCOSE  --   < > 203* 195* 172* 81 148*   --  129*  BUN  --   < > 27* 35* 31* 28* 29*  --  28*  CREATININE  --   < > 1.79* 1.60* 1.64* 1.90* 2.03*  --  2.10*  CALCIUM  --   --  8.8*  --  8.5* 8.7* 8.8*  --  8.9  MG 2.0  --   --   --   --   --   --   --   --   < > = values in this interval not displayed. GFR: Estimated Creatinine Clearance: 19.9 mL/min (A) (by C-G formula based on SCr of 2.1 mg/dL (H)). Liver Function Tests:  Recent Labs Lab 03/31/17 1132  AST 70*  ALT 34  ALKPHOS 150*  BILITOT 1.4*  PROT 7.1  ALBUMIN 2.5*   No results for input(s): LIPASE, AMYLASE in the last 168 hours. No results for input(s): AMMONIA in the last 168 hours. Coagulation Profile:  Recent Labs Lab 03/31/17 1132  INR 1.54   Cardiac Enzymes: No results for input(s): CKTOTAL, CKMB, CKMBINDEX, TROPONINI in the last 168 hours. BNP (last 3 results)  Recent Labs  09/20/16 1458 03/16/17 1441  PROBNP 140.0* 287.0*   HbA1C: No results for input(s): HGBA1C in the last 72 hours. CBG:  Recent Labs Lab 04/04/17 0008 04/04/17 0428 04/04/17 0729 04/04/17 1206 04/04/17 1553  GLUCAP 126* 136* 132* 212* 263*   Lipid Profile: No results for input(s): CHOL, HDL, LDLCALC, TRIG, CHOLHDL, LDLDIRECT in the last 72 hours. Thyroid Function Tests: No results for input(s): TSH, T4TOTAL, FREET4, T3FREE, THYROIDAB in the last 72 hours. Anemia Panel: No results for input(s): VITAMINB12, FOLATE, FERRITIN, TIBC, IRON, RETICCTPCT in the last 72 hours. Sepsis Labs: No results for input(s): PROCALCITON, LATICACIDVEN in the last 168 hours.  No results found for this or any previous visit (from the past 240 hour(s)).       Radiology Studies: No results found.      Scheduled Meds: . allopurinol  300 mg Oral Daily  . amiodarone  200 mg Oral Daily  . apixaban  2.5 mg Oral BID  . atorvastatin  20 mg Oral q1800  . DULoxetine  20 mg Oral QHS  . furosemide  40 mg Oral QPM  . furosemide  80 mg Oral Q breakfast  . insulin aspart  0-15 Units  Subcutaneous Q4H  . insulin glargine  10 Units Subcutaneous Q2200  . mouth rinse  15 mL Mouth Rinse  BID  . metoprolol succinate  25 mg Oral BID  . pantoprazole  40 mg Oral Daily  . protein supplement shake  11 oz Oral Q2000  . sodium chloride flush  3 mL Intravenous Q12H  . traZODone  50 mg Oral Once   Continuous Infusions:   LOS: 1 day    Time spent: > 35 minutes  Penny Pia, MD [facility] Pager [phone]  If 7PM-7AM, please contact night-coverage [email or web address] Password [facility] 04/04/2017, 4:06 PM

## 2017-04-05 DIAGNOSIS — I481 Persistent atrial fibrillation: Secondary | ICD-10-CM

## 2017-04-05 LAB — BASIC METABOLIC PANEL
ANION GAP: 10 (ref 5–15)
BUN: 32 mg/dL — ABNORMAL HIGH (ref 6–20)
CALCIUM: 8.9 mg/dL (ref 8.9–10.3)
CO2: 26 mmol/L (ref 22–32)
Chloride: 101 mmol/L (ref 101–111)
Creatinine, Ser: 2.22 mg/dL — ABNORMAL HIGH (ref 0.44–1.00)
GFR calc non Af Amer: 19 mL/min — ABNORMAL LOW (ref >60)
GFR, EST AFRICAN AMERICAN: 22 mL/min — AB (ref >60)
GLUCOSE: 234 mg/dL — AB (ref 65–99)
POTASSIUM: 3.7 mmol/L (ref 3.5–5.1)
Sodium: 137 mmol/L (ref 135–145)

## 2017-04-05 LAB — GLUCOSE, CAPILLARY
GLUCOSE-CAPILLARY: 202 mg/dL — AB (ref 65–99)
GLUCOSE-CAPILLARY: 265 mg/dL — AB (ref 65–99)
GLUCOSE-CAPILLARY: 286 mg/dL — AB (ref 65–99)
Glucose-Capillary: 169 mg/dL — ABNORMAL HIGH (ref 65–99)
Glucose-Capillary: 277 mg/dL — ABNORMAL HIGH (ref 65–99)

## 2017-04-05 LAB — CBC
HEMATOCRIT: 34.9 % — AB (ref 36.0–46.0)
HEMOGLOBIN: 10.7 g/dL — AB (ref 12.0–15.0)
MCH: 28.3 pg (ref 26.0–34.0)
MCHC: 30.7 g/dL (ref 30.0–36.0)
MCV: 92.3 fL (ref 78.0–100.0)
Platelets: 93 10*3/uL — ABNORMAL LOW (ref 150–400)
RBC: 3.78 MIL/uL — ABNORMAL LOW (ref 3.87–5.11)
RDW: 19.3 % — ABNORMAL HIGH (ref 11.5–15.5)
WBC: 8.4 10*3/uL (ref 4.0–10.5)

## 2017-04-05 MED ORDER — POTASSIUM CHLORIDE CRYS ER 20 MEQ PO TBCR
20.0000 meq | EXTENDED_RELEASE_TABLET | Freq: Every day | ORAL | 3 refills | Status: DC
Start: 2017-04-05 — End: 2017-04-11

## 2017-04-05 MED ORDER — ATORVASTATIN CALCIUM 20 MG PO TABS: 20.0000 mg | ORAL_TABLET | Freq: Every day | ORAL | 0 refills | Status: AC

## 2017-04-05 MED ORDER — FUROSEMIDE 40 MG PO TABS: 40.0000 mg | ORAL_TABLET | Freq: Every evening | ORAL | 0 refills | Status: DC

## 2017-04-05 MED ORDER — METOPROLOL SUCCINATE ER 25 MG PO TB24: 25.0000 mg | ORAL_TABLET | Freq: Two times a day (BID) | ORAL | 0 refills | Status: AC

## 2017-04-05 MED ORDER — INSULIN GLARGINE 100 UNIT/ML SOLOSTAR PEN: 10.0000 [IU] | PEN_INJECTOR | Freq: Every day | SUBCUTANEOUS | 99 refills | Status: DC

## 2017-04-05 MED ORDER — FUROSEMIDE 80 MG PO TABS: 80.0000 mg | ORAL_TABLET | Freq: Every day | ORAL | 0 refills | Status: DC

## 2017-04-05 MED ORDER — INSULIN GLARGINE 100 UNIT/ML ~~LOC~~ SOLN: 10.0000 [IU] | Freq: Every day | SUBCUTANEOUS | 11 refills | Status: DC

## 2017-04-05 NOTE — Discharge Summary (Signed)
Physician Discharge Summary  Danielle Benitez ZOX:[id number] DOB: 1932-04-06 DOA: 03/31/2017  PCP: Joaquim Nam, MD  Admit date: 03/31/2017 Discharge date: 04/05/2017  Time spent: > 35 minutes  Recommendations for Outpatient Follow-up:  Monitor K levels Monitor serum creatinine Pt to get PT at home Ensure f/u with cardiologist within 1 week from discharge.  Discharge Diagnoses:  Principal Problem:   Weakness Active Problems:   DM, type 2, with renal complications    CKD (chronic kidney disease), stage III   Essential hypertension   Atrial fibrillation - 07/11/15 by PPM interrogation, recurrent 10/19 after DCCV 10/14   Normochromic normocytic anemia   Thrombocytopenia (HCC)   Acute on chronic diastolic CHF (congestive heart failure) (HCC)   Discharge Condition: stable  Diet recommendation: heart healthy/carb modified.  Filed Weights   04/03/17 0416 04/04/17 0503 04/05/17 0300  Weight: 88.3 kg (194 lb 9.6 oz) 86.8 kg (191 lb 4.8 oz) 87.5 kg (192 lb 14.4 oz)    History of present illness:  83 y.o.femalewith a past medical history significant for HTN, iron-def anemia, bradycardia with pacer, Afib on Eliquis, HFpEF and CKD IIIwho presents with disparate complaints though mainly swelling for a few days and 5lb weight gain despite increased diuretics and then this morning generalized weakness and slurred speech.  MRI of brain did not report any causes for slurred speech. Patient was found to be weak from generalized deconditioning. Condition improved with diuresis and cardiology consulted for further evaluation recommendations.  Hospital Course:  1. Weakness: Unclear etiology. Most likely related to her weight gain/very mild CHF flare and generalized debility from lack of activity.  - MRI did not report any reasons for weakness - Worsening weakness suspected to be secondary to soft blood pressures. Improved with discontinuation of BP medication. PT recommending home health PT,  order placed.  2. Acute diastolic CHF: Mild. Weight reportedly up 5 lbs.  - Cardiology consulted and recommended the following:  OK for home from HF perspective HF Meds Amiodarone 200 mg daily Eliquis 2.5 mg BID Atorvastatin 20 mg daily Lasix 80 mg q am and 40 mg q pm Toprol XL 25 mg BID  Close follow up scheduled for 1 week (04/12/17 at 3 pm)  Graciella Freer, PA-C  04/05/2017  8:27 AM  Advanced Heart Failure Team Pager [phone] (M-F; 7a - 4p)  Please contact [facility] for night-coverage after hours (4p -7a ) and weekends on [email or web address]  Patient seen with PA, agree with the above note.  Off Imdur and on lower dose Toprol XL. BP higher, feeling better.  Volume status improved.  Would send home on the above meds and we will followup in the office.   We will start working on Cardiomems for her.   3. Elevated LFTs: Stable,  At baseline  4. Atrial fibrillation: CHADS2Vasc 5.  - anticoagulated with Eliquis - continue rate on amiodarone and Metoprolol dose listed above  5. Iron deficiency anemia: Stable, hgb at 10.7 on last check  6. Insulin dependent diabetes: -Continue prior to admission home medication regimen. Will decrease Lantus dose to 10 units subcutaneous daily  7. Hypertension: -Continue pt lower dose metoprolol, imdur discontinued this admission.  8. CKD IV: -  at baseline, recommend continued monitoring.  9. Nausea - resolved  10. Hyperkalemia - resolved  Procedures:  None  Consultations:  Cardiology  Discharge Exam: Vitals:   04/05/17 1230 04/05/17 1556  BP: (!) 143/69 (!) 120/39  Pulse: 70 70  Resp: 16 14  Temp:  98.4 F (36.9 C)    General: Pt in nad, alert and awake Cardiovascular: rrr, no rubs Respiratory: no increased wob, no wheezes  Discharge Instructions   Discharge Instructions    AMB referral to CHF clinic    Complete by:  As directed    Call MD for:  difficulty breathing, headache or  visual disturbances    Complete by:  As directed    Call MD for:  extreme fatigue    Complete by:  As directed    Call MD for:  temperature >100.4    Complete by:  As directed    Diet - low sodium heart healthy    Complete by:  As directed    Increase activity slowly    Complete by:  As directed      Current Discharge Medication List    CONTINUE these medications which have CHANGED   Details  atorvastatin (LIPITOR) 20 MG tablet Take 1 tablet (20 mg total) by mouth daily at 6 PM. Qty: 30 tablet, Refills: 0    !! furosemide (LASIX) 40 MG tablet Take 1 tablet (40 mg total) by mouth every evening. Qty: 30 tablet, Refills: 0    !! furosemide (LASIX) 80 MG tablet Take 1 tablet (80 mg total) by mouth daily with breakfast. Qty: 30 tablet, Refills: 0    Insulin Glargine (LANTUS SOLOSTAR) 100 UNIT/ML Solostar Pen Inject 10 Units into the skin daily at 10 pm. Qty: 5 pen, Refills: PRN    metoprolol succinate (TOPROL-XL) 25 MG 24 hr tablet Take 1 tablet (25 mg total) by mouth 2 (two) times daily. Qty: 60 tablet, Refills: 0    potassium chloride SA (K-DUR,KLOR-CON) 20 MEQ tablet Take 1 tablet (20 mEq total) by mouth daily. When you take metolazone Qty: 5 tablet, Refills: 3     !! - Potential duplicate medications found. Please discuss with provider.    CONTINUE these medications which have NOT CHANGED   Details  albuterol (PROVENTIL HFA;VENTOLIN HFA) 108 (90 Base) MCG/ACT inhaler Inhale 2 puffs into the lungs every 4 (four) hours as needed for wheezing or shortness of breath (cough, shortness of breath or wheezing.). Qty: 1 Inhaler, Refills: 1   Associated Diagnoses: COPD exacerbation (HCC)    allopurinol (ZYLOPRIM) 300 MG tablet TAKE 1 TABLET BY MOUTH DAILY Qty: 90 tablet, Refills: 1    amiodarone (PACERONE) 200 MG tablet TAKE 1 TABLET BY MOUTH DAILY Qty: 90 tablet, Refills: 3    DULoxetine (CYMBALTA) 20 MG capsule TAKE ONE CAPSULE BY MOUTH EVERY NIGHT AT BEDTIME Qty: 30 capsule,  Refills: 2   Associated Diagnoses: Depression, unspecified depression type    ELIQUIS 2.5 MG TABS tablet TAKE 1 TABLET BY MOUTH TWICE A DAY Qty: 180 tablet, Refills: 3    fluticasone (FLONASE) 50 MCG/ACT nasal spray Place 1 spray into both nostrils daily as needed for allergies or rhinitis.    GAS RELIEF 80 MG chewable tablet CHEW ONE TABLET BY MOUTH EVERY 6 HOURS AS NEEDED FOR GAS. Qty: 30 tablet, Refills: 5    glimepiride (AMARYL) 1 MG tablet TAKE 1 TABLET BY MOUTH DAILY WITH BREAKFAST Qty: 90 tablet, Refills: 1    Insulin Pen Needle (PEN NEEDLES) 30G X 5 MM MISC Inject 1 Dose as directed daily. Use with insulin pen Qty: 100 each, Refills: 3    loratadine (CLARITIN CHILDRENS) 5 MG chewable tablet Chew 1 tablet (5 mg total) by mouth daily.    Multiple Vitamins-Minerals (ONE-A-DAY 50 PLUS PO) Take 1 capsule  by mouth daily.    ONE TOUCH ULTRA TEST test strip USE TO CHECK BLOOD SUGAR ONCE A DAY AS DIRECTED Qty: 100 each, Refills: 3    ONGLYZA 2.5 MG TABS tablet TAKE 1 TABLET BY MOUTH DAILY Qty: 90 tablet, Refills: 1    pantoprazole (PROTONIX) 40 MG tablet TAKE 1 TABLET BY MOUTH DAILY Qty: 30 tablet, Refills: 5   Associated Diagnoses: Gastroesophageal reflux disease without esophagitis    senna-docusate (SENOKOT-S) 8.6-50 MG tablet Take 1 tablet by mouth daily as needed for mild constipation.    traZODone (DESYREL) 50 MG tablet Take 0.5-1 tablets (25-50 mg total) by mouth at bedtime as needed for sleep. Don't take >50mg  in a day. Qty: 30 tablet, Refills: 3      STOP taking these medications     isosorbide mononitrate (IMDUR) 30 MG 24 hr tablet      metolazone (ZAROXOLYN) 2.5 MG tablet      acetaminophen (TYLENOL) 325 MG tablet        Allergies  Allergen Reactions  . Beta Adrenergic Blockers Other (See Comments)    Couldn't speak or hear Metoprolol seems ok  . Jardiance [Empagliflozin] Other (See Comments)    Rash, kidney issues  . Spironolactone     Hyperkalemia    Follow-up Information    Joaquim Nam, MD Follow up.   Specialty:  Family Medicine Contact information: [address] [place] [address] [phone]        [facility] Follow up.   Why:  they will continue to do your home health care at your home Contact information: [address] [place] [address] [phone]        [facility] Follow up on 04/12/2017.   Specialty:  Cardiology Why:  at 300 pm for post hospital follow up. Code for parking is 7000. Contact information: [address] [id number] [place] [address] [phone]           The results of significant diagnostics from this hospitalization (including imaging, microbiology, ancillary and laboratory) are listed below for reference.    Significant Diagnostic Studies: Dg Chest 2 View  Result Date: 03/17/2017 CLINICAL DATA:  Heart failure EXAM: CHEST  2 VIEW COMPARISON:  02/14/2017 FINDINGS: Chronic cardiomegaly. Dual-chamber pacer leads from the right in unremarkable position. There is no edema, consolidation, effusion, or pneumothorax. Glenohumeral osteoarthritis and thoracic disc degeneration and spondylosis. IMPRESSION: No evidence of active disease.  Cardiomegaly without edema. Electronically Signed   By: Marnee Spring M.D.   On: 03/17/2017 14:48   Ct Head Wo Contrast  Result Date: 03/31/2017 CLINICAL DATA:  Frontal headache.  Several falls over the past week. EXAM: CT HEAD WITHOUT CONTRAST CT CERVICAL SPINE WITHOUT CONTRAST TECHNIQUE: Multidetector CT imaging of the head and cervical spine was performed following the standard protocol without intravenous contrast. Multiplanar CT image reconstructions of the cervical spine were also generated. COMPARISON:  01/08/2012 FINDINGS: CT HEAD FINDINGS Brain: No evidence of acute infarction, hemorrhage, extra-axial collection, ventriculomegaly, or mass effect.  Generalized cerebral atrophy. Periventricular white matter low attenuation likely secondary to microangiopathy. Vascular: Cerebrovascular atherosclerotic calcifications are noted. Skull: Negative for fracture or focal lesion. Sinuses/Orbits: Visualized portions of the orbits are unremarkable. Visualized portions of the paranasal sinuses and mastoid air cells are unremarkable. Other: None. CT CERVICAL SPINE FINDINGS Alignment: Normal. Skull base and vertebrae: No acute fracture. No primary bone lesion or focal pathologic process. Soft tissues and  spinal canal: No prevertebral fluid or swelling. No visible canal hematoma. Disc levels: Degenerative disc disease with disc height loss at C3-4, C4-5, C5-6 and C6-7. Mild bilateral facet arthropathy at C2-3. Moderate bilateral facet arthropathy and moderate bilateral foraminal stenosis at C3-4. Moderate bilateral facet arthropathy and severe right foraminal stenosis at C4-5. Moderate bilateral facet arthropathy and foraminal stenosis at C5-6. At C6-7 there is a broad-based disc osteophyte complex with left uncovertebral degenerative changes and moderate left facet arthropathy resulting in foraminal stenosis. Upper chest: Lung apices are clear. Other: No fluid collection or hematoma. Bilateral carotid artery atherosclerosis. IMPRESSION: 1. No acute intracranial pathology. 2. No acute osseous injury of the cervical spine. 3. Cervical spine spondylosis as described above. Electronically Signed   By: Elige Ko   On: 03/31/2017 12:31   Ct Cervical Spine Wo Contrast  Result Date: 03/31/2017 CLINICAL DATA:  Frontal headache.  Several falls over the past week. EXAM: CT HEAD WITHOUT CONTRAST CT CERVICAL SPINE WITHOUT CONTRAST TECHNIQUE: Multidetector CT imaging of the head and cervical spine was performed following the standard protocol without intravenous contrast. Multiplanar CT image reconstructions of the cervical spine were also generated. COMPARISON:  01/08/2012  FINDINGS: CT HEAD FINDINGS Brain: No evidence of acute infarction, hemorrhage, extra-axial collection, ventriculomegaly, or mass effect. Generalized cerebral atrophy. Periventricular white matter low attenuation likely secondary to microangiopathy. Vascular: Cerebrovascular atherosclerotic calcifications are noted. Skull: Negative for fracture or focal lesion. Sinuses/Orbits: Visualized portions of the orbits are unremarkable. Visualized portions of the paranasal sinuses and mastoid air cells are unremarkable. Other: None. CT CERVICAL SPINE FINDINGS Alignment: Normal. Skull base and vertebrae: No acute fracture. No primary bone lesion or focal pathologic process. Soft tissues and spinal canal: No prevertebral fluid or swelling. No visible canal hematoma. Disc levels: Degenerative disc disease with disc height loss at C3-4, C4-5, C5-6 and C6-7. Mild bilateral facet arthropathy at C2-3. Moderate bilateral facet arthropathy and moderate bilateral foraminal stenosis at C3-4. Moderate bilateral facet arthropathy and severe right foraminal stenosis at C4-5. Moderate bilateral facet arthropathy and foraminal stenosis at C5-6. At C6-7 there is a broad-based disc osteophyte complex with left uncovertebral degenerative changes and moderate left facet arthropathy resulting in foraminal stenosis. Upper chest: Lung apices are clear. Other: No fluid collection or hematoma. Bilateral carotid artery atherosclerosis. IMPRESSION: 1. No acute intracranial pathology. 2. No acute osseous injury of the cervical spine. 3. Cervical spine spondylosis as described above. Electronically Signed   By: Elige Ko   On: 03/31/2017 12:31   Mr Brain Wo Contrast (neuro Protocol)  Result Date: 03/31/2017 CLINICAL DATA:  Facial droop and slurred speech. History of memory loss, atrial fibrillation, dyslipidemia, pacemaker. EXAM: MRI HEAD WITHOUT CONTRAST TECHNIQUE: Multiplanar, multiecho pulse sequences of the brain and surrounding structures  were obtained without intravenous contrast. COMPARISON:  CT HEAD March 31, 2017 at 1218 hours FINDINGS: BRAIN: No reduced diffusion to suggest acute ischemia. No susceptibility artifact to suggest hemorrhage. The ventricles and sulci are normal for patient's age. Patchy supratentorial white matter FLAIR T2 hyperintensities. Old tiny cerebellar infarcts. Old RIGHT thalamus lacunar infarct. Old RIGHT basal ganglia lacunar infarct. No suspicious parenchymal signal, masses or mass effect. No abnormal extra-axial fluid collections. VASCULAR: Normal major intracranial vascular flow voids present at skull base. SKULL AND UPPER CERVICAL SPINE: No abnormal sellar expansion. No suspicious calvarial bone marrow signal. Craniocervical junction maintained. Moderate degenerative change of the included cervical spine. SINUSES/ORBITS: Trace paranasal sinus mucosal thickening without air-fluid levels. Mastoid air cells are well aerated.  The included ocular globes and orbital contents are non-suspicious. Status post bilateral ocular lens implants. OTHER: Patient is edentulous. IMPRESSION: No acute intracranial process. Mild to moderate chronic small vessel ischemic disease. Old RIGHT basal ganglia and thalamus lacunar infarcts and, old tiny cerebellar infarcts. Electronically Signed   By: Awilda Metro M.D.   On: 03/31/2017 20:49   Dg Chest Port 1 View  Result Date: 03/31/2017 CLINICAL DATA:  Onset of slurred speech at 8 a.m. today. History of CHF, COPD, coronary artery disease EXAM: PORTABLE CHEST 1 VIEW COMPARISON:  PA and lateral chest x-ray of March 17, 2017 FINDINGS: The lungs are adequately inflated and clear. The cardiac silhouette is enlarged. The pulmonary vascularity is not engorged. The mediastinum is normal in width. The ICD is in stable position. There degenerative changes of the shoulders. IMPRESSION: Cardiomegaly. No pulmonary edema, pneumonia, nor other acute cardiopulmonary abnormality. Electronically Signed    By: David  [place] M.D.   On: 03/31/2017 12:16    Microbiology: No results found for this or any previous visit (from the past 240 hour(s)).   Labs: Basic Metabolic Panel:  Recent Labs Lab 03/31/17 0018  04/01/17 1610 04/02/17 9604 04/03/17 5409 04/03/17 0911 04/04/17 0321 04/05/17 0402  NA  --   < > 141 140 136  --  138 137  K  --   < > 4.3 4.9 6.1* 5.8* 3.9 3.7  CL  --   < > 105 105 103  --  102 101  CO2  --   < > 27 28 26   --  26 26  GLUCOSE  --   < > 172* 81 148*  --  129* 234*  BUN  --   < > 31* 28* 29*  --  28* 32*  CREATININE  --   < > 1.64* 1.90* 2.03*  --  2.10* 2.22*  CALCIUM  --   < > 8.5* 8.7* 8.8*  --  8.9 8.9  MG 2.0  --   --   --   --   --   --   --   < > = values in this interval not displayed. Liver Function Tests:  Recent Labs Lab 03/31/17 1132  AST 70*  ALT 34  ALKPHOS 150*  BILITOT 1.4*  PROT 7.1  ALBUMIN 2.5*   No results for input(s): LIPASE, AMYLASE in the last 168 hours. No results for input(s): AMMONIA in the last 168 hours. CBC:  Recent Labs Lab 03/31/17 1132 03/31/17 1218 04/01/17 0454 04/04/17 0321 04/05/17 0402  WBC 8.8  --  9.1 8.5 8.4  NEUTROABS 6.9  --   --   --   --   HGB 11.4* 13.3 10.8* 10.6* 10.7*  HCT 36.9 39.0 35.1* 34.8* 34.9*  MCV 90.4  --  92.1 90.4 92.3  PLT 103*  --  103* 100* 93*   Cardiac Enzymes: No results for input(s): CKTOTAL, CKMB, CKMBINDEX, TROPONINI in the last 168 hours. BNP: BNP (last 3 results)  Recent Labs  01/20/17 1155 03/17/17 1744 03/31/17 2000  BNP 197.7* 365.9* 398.4*    ProBNP (last 3 results)  Recent Labs  09/20/16 1458 03/16/17 1441  PROBNP 140.0* 287.0*    CBG:  Recent Labs Lab 04/05/17 0030 04/05/17 0443 04/05/17 0804 04/05/17 1203 04/05/17 1647  GLUCAP 265* 202* 169* 286* 277*    Signed:  Penny Pia MD.  [facility] 04/05/2017, 5:30 PM

## 2017-04-05 NOTE — Consult Note (Signed)
   [facility] CM Inpatient Consult   04/05/2017  Danielle Benitez 1932/07/21 [id number]  Patient was assessed for hospital admission in the [facility] ACO.  Patient has been assigned EMMI HF calls by the inpatient RNCM.  Patient also is an active patient of Dr. Crawford Givens in which this office follows the patient's [facility] for needs. Patient is also a patient of the Advanced HF clinic.  No current [facility] Care Management needs noted.  For questions or changes please contact:  Charlesetta Shanks, RN BSN CCM [facility]  [phone] business mobile phone Toll free office [phone]

## 2017-04-05 NOTE — Progress Notes (Signed)
Pt is alert and oriented with Daughter and grandson at bedside, Anticipating d/c today. With home health

## 2017-04-05 NOTE — Plan of Care (Signed)
Problem: Education: Goal: Ability to demonstrate managment of disease process will improve Outcome: Adequate for Discharge Going home with home health

## 2017-04-05 NOTE — Progress Notes (Signed)
SUBJECTIVE:   Feeling "much better". Biggest complaint is ongoing fatigue.  Excited to go home.  Denies orthopnea or CP.   Negative 1.7 L on po lasix. Creatinine mildly elevated at 2.22. (Baseline 1.6 - 1.8)  Scheduled Meds: . allopurinol  300 mg Oral Daily  . amiodarone  200 mg Oral Daily  . apixaban  2.5 mg Oral BID  . atorvastatin  20 mg Oral q1800  . DULoxetine  20 mg Oral QHS  . furosemide  40 mg Oral QPM  . furosemide  80 mg Oral Q breakfast  . insulin aspart  0-15 Units Subcutaneous Q4H  . insulin glargine  10 Units Subcutaneous Q2200  . mouth rinse  15 mL Mouth Rinse BID  . metoprolol succinate  25 mg Oral BID  . pantoprazole  40 mg Oral Daily  . protein supplement shake  11 oz Oral Q2000  . sodium chloride flush  3 mL Intravenous Q12H  . traZODone  50 mg Oral Once   Continuous Infusions: PRN Meds:.acetaminophen **OR** acetaminophen, neomycin-bacitracin-polymyxin    Vitals:   04/04/17 0503 04/04/17 2056 04/05/17 0300 04/05/17 0800  BP: (!) 105/44 (!) 113/43 (!) 135/52 (!) 122/44  Pulse: 71 70 71 70  Resp: 16 17 17 13   Temp: 98.1 F (36.7 C) 98.1 F (36.7 C) 98.6 F (37 C) 98.1 F (36.7 C)  TempSrc: Oral Oral Oral Oral  SpO2: 98% 96% 98% 98%  Weight: 191 lb 4.8 oz (86.8 kg)  192 lb 14.4 oz (87.5 kg)   Height:        Intake/Output Summary (Last 24 hours) at 04/05/17 0827 Last data filed at 04/05/17 1610  Gross per 24 hour  Intake              240 ml  Output             2000 ml  Net            -1760 ml    LABS: Basic Metabolic Panel:  Recent Labs  96/04/54 0321 04/05/17 0402  NA 138 137  K 3.9 3.7  CL 102 101  CO2 26 26  GLUCOSE 129* 234*  BUN 28* 32*  CREATININE 2.10* 2.22*  CALCIUM 8.9 8.9   Liver Function Tests: No results for input(s): AST, ALT, ALKPHOS, BILITOT, PROT, ALBUMIN in the last 72 hours. No results for input(s): LIPASE, AMYLASE in the last 72 hours. CBC:  Recent Labs  04/04/17 0321 04/05/17 0402  WBC 8.5 8.4  HGB  10.6* 10.7*  HCT 34.8* 34.9*  MCV 90.4 92.3  PLT 100* 93*   Cardiac Enzymes: No results for input(s): CKTOTAL, CKMB, CKMBINDEX, TROPONINI in the last 72 hours. BNP: Invalid input(s): POCBNP D-Dimer: No results for input(s): DDIMER in the last 72 hours. Hemoglobin A1C: No results for input(s): HGBA1C in the last 72 hours. Fasting Lipid Panel: No results for input(s): CHOL, HDL, LDLCALC, TRIG, CHOLHDL, LDLDIRECT in the last 72 hours. Thyroid Function Tests: No results for input(s): TSH, T4TOTAL, T3FREE, THYROIDAB in the last 72 hours.  Invalid input(s): FREET3 Anemia Panel: No results for input(s): VITAMINB12, FOLATE, FERRITIN, TIBC, IRON, RETICCTPCT in the last 72 hours.  RADIOLOGY: Dg Chest 2 View  Result Date: 03/17/2017 CLINICAL DATA:  Heart failure EXAM: CHEST  2 VIEW COMPARISON:  02/14/2017 FINDINGS: Chronic cardiomegaly. Dual-chamber pacer leads from the right in unremarkable position. There is no edema, consolidation, effusion, or pneumothorax. Glenohumeral osteoarthritis and thoracic disc degeneration and spondylosis. IMPRESSION: No evidence of  active disease.  Cardiomegaly without edema. Electronically Signed   By: Marnee Spring M.D.   On: 03/17/2017 14:48   Ct Head Wo Contrast  Result Date: 03/31/2017 CLINICAL DATA:  Frontal headache.  Several falls over the past week. EXAM: CT HEAD WITHOUT CONTRAST CT CERVICAL SPINE WITHOUT CONTRAST TECHNIQUE: Multidetector CT imaging of the head and cervical spine was performed following the standard protocol without intravenous contrast. Multiplanar CT image reconstructions of the cervical spine were also generated. COMPARISON:  01/08/2012 FINDINGS: CT HEAD FINDINGS Brain: No evidence of acute infarction, hemorrhage, extra-axial collection, ventriculomegaly, or mass effect. Generalized cerebral atrophy. Periventricular white matter low attenuation likely secondary to microangiopathy. Vascular: Cerebrovascular atherosclerotic calcifications  are noted. Skull: Negative for fracture or focal lesion. Sinuses/Orbits: Visualized portions of the orbits are unremarkable. Visualized portions of the paranasal sinuses and mastoid air cells are unremarkable. Other: None. CT CERVICAL SPINE FINDINGS Alignment: Normal. Skull base and vertebrae: No acute fracture. No primary bone lesion or focal pathologic process. Soft tissues and spinal canal: No prevertebral fluid or swelling. No visible canal hematoma. Disc levels: Degenerative disc disease with disc height loss at C3-4, C4-5, C5-6 and C6-7. Mild bilateral facet arthropathy at C2-3. Moderate bilateral facet arthropathy and moderate bilateral foraminal stenosis at C3-4. Moderate bilateral facet arthropathy and severe right foraminal stenosis at C4-5. Moderate bilateral facet arthropathy and foraminal stenosis at C5-6. At C6-7 there is a broad-based disc osteophyte complex with left uncovertebral degenerative changes and moderate left facet arthropathy resulting in foraminal stenosis. Upper chest: Lung apices are clear. Other: No fluid collection or hematoma. Bilateral carotid artery atherosclerosis. IMPRESSION: 1. No acute intracranial pathology. 2. No acute osseous injury of the cervical spine. 3. Cervical spine spondylosis as described above. Electronically Signed   By: Elige Ko   On: 03/31/2017 12:31   Ct Cervical Spine Wo Contrast  Result Date: 03/31/2017 CLINICAL DATA:  Frontal headache.  Several falls over the past week. EXAM: CT HEAD WITHOUT CONTRAST CT CERVICAL SPINE WITHOUT CONTRAST TECHNIQUE: Multidetector CT imaging of the head and cervical spine was performed following the standard protocol without intravenous contrast. Multiplanar CT image reconstructions of the cervical spine were also generated. COMPARISON:  01/08/2012 FINDINGS: CT HEAD FINDINGS Brain: No evidence of acute infarction, hemorrhage, extra-axial collection, ventriculomegaly, or mass effect. Generalized cerebral atrophy.  Periventricular white matter low attenuation likely secondary to microangiopathy. Vascular: Cerebrovascular atherosclerotic calcifications are noted. Skull: Negative for fracture or focal lesion. Sinuses/Orbits: Visualized portions of the orbits are unremarkable. Visualized portions of the paranasal sinuses and mastoid air cells are unremarkable. Other: None. CT CERVICAL SPINE FINDINGS Alignment: Normal. Skull base and vertebrae: No acute fracture. No primary bone lesion or focal pathologic process. Soft tissues and spinal canal: No prevertebral fluid or swelling. No visible canal hematoma. Disc levels: Degenerative disc disease with disc height loss at C3-4, C4-5, C5-6 and C6-7. Mild bilateral facet arthropathy at C2-3. Moderate bilateral facet arthropathy and moderate bilateral foraminal stenosis at C3-4. Moderate bilateral facet arthropathy and severe right foraminal stenosis at C4-5. Moderate bilateral facet arthropathy and foraminal stenosis at C5-6. At C6-7 there is a broad-based disc osteophyte complex with left uncovertebral degenerative changes and moderate left facet arthropathy resulting in foraminal stenosis. Upper chest: Lung apices are clear. Other: No fluid collection or hematoma. Bilateral carotid artery atherosclerosis. IMPRESSION: 1. No acute intracranial pathology. 2. No acute osseous injury of the cervical spine. 3. Cervical spine spondylosis as described above. Electronically Signed   By: Elige Ko  On: 03/31/2017 12:31   Mr Brain Wo Contrast (neuro Protocol)  Result Date: 03/31/2017 CLINICAL DATA:  Facial droop and slurred speech. History of memory loss, atrial fibrillation, dyslipidemia, pacemaker. EXAM: MRI HEAD WITHOUT CONTRAST TECHNIQUE: Multiplanar, multiecho pulse sequences of the brain and surrounding structures were obtained without intravenous contrast. COMPARISON:  CT HEAD March 31, 2017 at 1218 hours FINDINGS: BRAIN: No reduced diffusion to suggest acute ischemia. No  susceptibility artifact to suggest hemorrhage. The ventricles and sulci are normal for patient's age. Patchy supratentorial white matter FLAIR T2 hyperintensities. Old tiny cerebellar infarcts. Old RIGHT thalamus lacunar infarct. Old RIGHT basal ganglia lacunar infarct. No suspicious parenchymal signal, masses or mass effect. No abnormal extra-axial fluid collections. VASCULAR: Normal major intracranial vascular flow voids present at skull base. SKULL AND UPPER CERVICAL SPINE: No abnormal sellar expansion. No suspicious calvarial bone marrow signal. Craniocervical junction maintained. Moderate degenerative change of the included cervical spine. SINUSES/ORBITS: Trace paranasal sinus mucosal thickening without air-fluid levels. Mastoid air cells are well aerated. The included ocular globes and orbital contents are non-suspicious. Status post bilateral ocular lens implants. OTHER: Patient is edentulous. IMPRESSION: No acute intracranial process. Mild to moderate chronic small vessel ischemic disease. Old RIGHT basal ganglia and thalamus lacunar infarcts and, old tiny cerebellar infarcts. Electronically Signed   By: Awilda Metro M.D.   On: 03/31/2017 20:49   Dg Chest Port 1 View  Result Date: 03/31/2017 CLINICAL DATA:  Onset of slurred speech at 8 a.m. today. History of CHF, COPD, coronary artery disease EXAM: PORTABLE CHEST 1 VIEW COMPARISON:  PA and lateral chest x-ray of March 17, 2017 FINDINGS: The lungs are adequately inflated and clear. The cardiac silhouette is enlarged. The pulmonary vascularity is not engorged. The mediastinum is normal in width. The ICD is in stable position. There degenerative changes of the shoulders. IMPRESSION: Cardiomegaly. No pulmonary edema, pneumonia, nor other acute cardiopulmonary abnormality. Electronically Signed   By: David  [place] M.D.   On: 03/31/2017 12:16    PHYSICAL EXAM General: Fatigued appearing. No resp difficulty. HEENT: Normal Neck: Supple. JVP 6-7 cm.  Carotids 2+ bilat; no bruits. No thyromegaly or nodule noted. Cor: PMI nondisplaced. RRR, No M/G/R noted Lungs: CTAB, normal effort. Abdomen: Soft, non-tender, non-distended, no HSM. No bruits or masses. +BS  Extremities: No cyanosis, clubbing, rash, R and LLE no edema.  Neuro: Alert & orientedx3, cranial nerves grossly intact. moves all 4 extremities w/o difficulty. Affect pleasant   TELEMETRY: Personally reviewed, A paced, V sensed.   ASSESSMENT AND PLAN: 1. Acute on chronic diastolic CHF: Recent echo with EF 60-65%.  She diuresed better yesterday, weight down 3 lbs.  She does not look particularly volume overloaded at this point.   - Continue Lasix 80 qam/40 qpm.  - Volume getting more difficult to manage and exam difficult.   - Will try to arrange for Cardiomems placement as outpatient.  2. Generalized weakness: BP soft, stopped Imdur and decreased Toprol XL to 25 mg bid.   - PT recommends HH PT.  3. 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 Baseline ~1.6-1.8.  - Will check at visit next week. 4. Atrial fibrillation: Paroxysmal.   - She appears to be a-paced (not Afib) by tele.  - Continue eliquis. No bleeding.    OK for home from HF perspective HF Meds Amiodarone 200 mg daily Eliquis 2.5 mg BID Atorvastatin 20 mg daily Lasix 80 mg q am and 40 mg q pm Toprol XL 25 mg BID  Close follow  up scheduled for 1 week (04/12/17 at 3 pm)  Graciella Freer, PA-C  04/05/2017  8:27 AM  Advanced Heart Failure Team Pager [phone] (M-F; 7a - 4p)  Please contact [facility] for night-coverage after hours (4p -7a ) and weekends on [email or web address]  Patient seen with PA, agree with the above note.  Off Imdur and on lower dose Toprol XL. BP higher, feeling better.  Volume status improved.  Would send home on the above meds and we will followup in the office.   We will start working on Cardiomems for her.   [name] 04/05/2017 8:55 AM

## 2017-04-06 ENCOUNTER — Telehealth: Payer: Self-pay

## 2017-04-06 NOTE — Telephone Encounter (Signed)
Danielle Benitez with [facility]  Request cb verbal order for skilled nursing and PT. Pt just discharged from [facility] hospital for TIA.

## 2017-04-06 NOTE — Telephone Encounter (Signed)
Please give the order. Use diagnosis of weakness. Diagnosis R53.1. Thanks.

## 2017-04-06 NOTE — Telephone Encounter (Signed)
Verbal order given to [facility] by telephone and verbalized understanding.

## 2017-04-07 ENCOUNTER — Other Ambulatory Visit: Payer: Self-pay | Admitting: Family Medicine

## 2017-04-07 DIAGNOSIS — M25572 Pain in left ankle and joints of left foot: Secondary | ICD-10-CM | POA: Diagnosis not present

## 2017-04-07 DIAGNOSIS — E1122 Type 2 diabetes mellitus with diabetic chronic kidney disease: Secondary | ICD-10-CM | POA: Diagnosis not present

## 2017-04-07 DIAGNOSIS — Z794 Long term (current) use of insulin: Secondary | ICD-10-CM | POA: Diagnosis not present

## 2017-04-07 DIAGNOSIS — N183 Chronic kidney disease, stage 3 (moderate): Secondary | ICD-10-CM | POA: Diagnosis not present

## 2017-04-07 DIAGNOSIS — I2510 Atherosclerotic heart disease of native coronary artery without angina pectoris: Secondary | ICD-10-CM | POA: Diagnosis not present

## 2017-04-07 DIAGNOSIS — S99922D Unspecified injury of left foot, subsequent encounter: Secondary | ICD-10-CM | POA: Diagnosis not present

## 2017-04-07 DIAGNOSIS — E785 Hyperlipidemia, unspecified: Secondary | ICD-10-CM | POA: Diagnosis not present

## 2017-04-07 DIAGNOSIS — Z9181 History of falling: Secondary | ICD-10-CM | POA: Diagnosis not present

## 2017-04-07 DIAGNOSIS — I130 Hypertensive heart and chronic kidney disease with heart failure and stage 1 through stage 4 chronic kidney disease, or unspecified chronic kidney disease: Secondary | ICD-10-CM | POA: Diagnosis not present

## 2017-04-07 DIAGNOSIS — I5022 Chronic systolic (congestive) heart failure: Secondary | ICD-10-CM | POA: Diagnosis not present

## 2017-04-07 DIAGNOSIS — J449 Chronic obstructive pulmonary disease, unspecified: Secondary | ICD-10-CM | POA: Diagnosis not present

## 2017-04-07 DIAGNOSIS — E1142 Type 2 diabetes mellitus with diabetic polyneuropathy: Secondary | ICD-10-CM | POA: Diagnosis not present

## 2017-04-07 DIAGNOSIS — I951 Orthostatic hypotension: Secondary | ICD-10-CM | POA: Diagnosis not present

## 2017-04-07 DIAGNOSIS — M109 Gout, unspecified: Secondary | ICD-10-CM | POA: Diagnosis not present

## 2017-04-07 DIAGNOSIS — I4891 Unspecified atrial fibrillation: Secondary | ICD-10-CM | POA: Diagnosis not present

## 2017-04-07 DIAGNOSIS — R269 Unspecified abnormalities of gait and mobility: Secondary | ICD-10-CM | POA: Diagnosis not present

## 2017-04-10 DIAGNOSIS — N390 Urinary tract infection, site not specified: Secondary | ICD-10-CM

## 2017-04-10 HISTORY — DX: Urinary tract infection, site not specified: N39.0

## 2017-04-11 ENCOUNTER — Encounter: Payer: Self-pay | Admitting: Family Medicine

## 2017-04-11 ENCOUNTER — Telehealth: Payer: Self-pay | Admitting: Family Medicine

## 2017-04-11 ENCOUNTER — Ambulatory Visit (INDEPENDENT_AMBULATORY_CARE_PROVIDER_SITE_OTHER): Payer: Medicare Other | Admitting: Family Medicine

## 2017-04-11 VITALS — BP 110/72 | HR 84 | Temp 97.9°F | Wt 193.5 lb

## 2017-04-11 DIAGNOSIS — I5022 Chronic systolic (congestive) heart failure: Secondary | ICD-10-CM

## 2017-04-11 DIAGNOSIS — R0602 Shortness of breath: Secondary | ICD-10-CM

## 2017-04-11 LAB — CBC WITH DIFFERENTIAL/PLATELET
BASOS PCT: 0.7 % (ref 0.0–3.0)
Basophils Absolute: 0.1 10*3/uL (ref 0.0–0.1)
EOS PCT: 0.5 % (ref 0.0–5.0)
Eosinophils Absolute: 0 10*3/uL (ref 0.0–0.7)
HCT: 34.8 % — ABNORMAL LOW (ref 36.0–46.0)
HEMOGLOBIN: 11 g/dL — AB (ref 12.0–15.0)
LYMPHS ABS: 1.4 10*3/uL (ref 0.7–4.0)
Lymphocytes Relative: 17.7 % (ref 12.0–46.0)
MCHC: 31.6 g/dL (ref 30.0–36.0)
MCV: 91.1 fl (ref 78.0–100.0)
MONO ABS: 0.7 10*3/uL (ref 0.1–1.0)
Monocytes Relative: 9.1 % (ref 3.0–12.0)
Neutro Abs: 5.6 10*3/uL (ref 1.4–7.7)
Neutrophils Relative %: 72 % (ref 43.0–77.0)
Platelets: 109 10*3/uL — ABNORMAL LOW (ref 150.0–400.0)
RBC: 3.82 Mil/uL — ABNORMAL LOW (ref 3.87–5.11)
RDW: 20.2 % — AB (ref 11.5–15.5)
WBC: 7.8 10*3/uL (ref 4.0–10.5)

## 2017-04-11 LAB — BASIC METABOLIC PANEL
BUN: 37 mg/dL — AB (ref 6–23)
CHLORIDE: 100 meq/L (ref 96–112)
CO2: 28 meq/L (ref 19–32)
Calcium: 9 mg/dL (ref 8.4–10.5)
Creatinine, Ser: 1.98 mg/dL — ABNORMAL HIGH (ref 0.40–1.20)
GFR: 25.57 mL/min — ABNORMAL LOW (ref >60.00)
GLUCOSE: 187 mg/dL — AB (ref 70–99)
POTASSIUM: 3.6 meq/L (ref 3.5–5.1)
Sodium: 139 mEq/L (ref 135–145)

## 2017-04-11 LAB — BRAIN NATRIURETIC PEPTIDE: Pro B Natriuretic peptide (BNP): 215 pg/mL — ABNORMAL HIGH (ref 0.0–100.0)

## 2017-04-11 MED ORDER — FUROSEMIDE 40 MG PO TABS: ORAL_TABLET | ORAL | 0 refills | Status: DC

## 2017-04-11 MED ORDER — INSULIN GLARGINE 100 UNIT/ML SOLOSTAR PEN: 26.0000 [IU] | PEN_INJECTOR | Freq: Every day | SUBCUTANEOUS | Status: DC

## 2017-04-11 NOTE — Patient Instructions (Signed)
Let me know if PT isn't going to get set up.  I wouldn't get a lift chair.  Go to the lab on the way out.  We'll contact you with your lab report. Don't change your meds for now.  We'll be in touch.

## 2017-04-11 NOTE — Telephone Encounter (Signed)
See mychart message, d/w pt at OV.

## 2017-04-11 NOTE — Progress Notes (Signed)
Admit date: 03/31/2017 Discharge date: 04/05/2017  Time spent: > 35 minutes  Recommendations for Outpatient Follow-up:  Monitor K levels Monitor serum creatinine Pt to get PT at home Ensure f/u with cardiologist within 1 week from discharge.  Discharge Diagnoses:  Principal Problem:   Weakness Active Problems:   DM, type 2, with renal complications    CKD (chronic kidney disease), stage III   Essential hypertension   Atrial fibrillation - 07/11/15 by PPM interrogation, recurrent 10/19 after DCCV 10/14   Normochromic normocytic anemia   Thrombocytopenia (HCC)   Acute on chronic diastolic CHF (congestive heart failure) (HCC)   Discharge Condition: stable  Diet recommendation: heart healthy/carb modified.       Filed Weights   04/03/17 0416 04/04/17 0503 04/05/17 0300  Weight: 88.3 kg (194 lb 9.6 oz) 86.8 kg (191 lb 4.8 oz) 87.5 kg (192 lb 14.4 oz)    History of present illness:  83 y.o.femalewith a past medical history significant for HTN, iron-def anemia, bradycardia with pacer, Afib on Eliquis, HFpEF and CKD IIIwho presents with disparate complaints though mainly swelling for a few days and 5lb weight gain despite increased diuretics and then this morning generalized weakness and slurred speech.  MRI of brain did not report any causes for slurred speech. Patient was found to be weak from generalized deconditioning. Condition improved with diuresis and cardiology consulted for further evaluation recommendations.  Hospital Course:  1. Weakness: Unclear etiology. Most likely related to her weight gain/very mild CHF flare and generalized debility from lack of activity.  - MRI did not report any reasons for weakness - Worsening weakness suspected to be secondary to soft blood pressures. Improved with discontinuation of BP medication. PT recommending home health PT, order placed.  2. Acute diastolic CHF: Mild. Weight reportedly up 5 lbs.  -Cardiology  consulted and recommended the following:  OK for home from HF perspective HF Meds Amiodarone 200 mg daily Eliquis 2.5 mg BID Atorvastatin 20 mg daily Lasix 80 mg q am and 40 mg q pm Toprol XL 25 mg BID  Close follow up scheduled for 1 week (04/12/17 at 3 pm)  Graciella Freer, PA-C 04/05/2017 8:27 AM  Advanced Heart Failure Team Pager [phone] (M-F; 7a - 4p) Please contact [facility] for night-coverage after hours (4p -7a ) and weekends on [email or web address]  Patient seen with PA, agree with the above note. Off Imdur and on lower dose Toprol XL. BP higher, feeling better. Volume status improved. Would send home on the above meds and we will followup in the office.   We will start working on Cardiomems for her.   3. Elevated LFTs: Stable, At baseline  4. Atrial fibrillation: CHADS2Vasc 5.  -anticoagulated with Eliquis -continue rate on amiodarone and Metoprolol dose listed above  5. Iron deficiency anemia: Stable, hgb at 10.7 on last check  6. Insulin dependent diabetes: -Continue prior to admission home medication regimen. Will decrease Lantus dose to 10 units subcutaneous daily  7. Hypertension: -Continue ptlower dose metoprolol, imdur discontinued this admission.  8. CKD IV: - at baseline, recommend continued monitoring.  9. Nausea - resolved  10. Hyperkalemia - resolved  ========================================== Inpatient follow-up.  The above discussed with patient. She was admitted for weakness which could've been related to CHF, deconditioning, possibly a viral illness.  She was getting some better prior to admission, with PT, until the most recent set back.  D/w pt and daughter.  Still SOB with exertion, but improved some from point of  admission. Compliant with current medications. She has supportive family at home but she feels like she is a burden and this worries her. She wants to consider options for placement, but does not  have any formal plans yet. She still has plans for physical therapy to come to the house. She does have a history of falls. She has noted some memory changes since the hospitalization. Family is also noted this. No red flag symptoms but she has had more difficulty carrying out complicated tasks.  She does have a history of CVA noted, d/w pt.   PMH and SH reviewed  ROS: Per HPI unless specifically indicated in ROS section   Meds, vitals, and allergies reviewed.   nad Tearful but regains composure Mucous membranes moist Neck supple no lymphadenopathy Mild dec BS at the bases B but otherwise clear Regular rate and rhythm Abdomen soft, nontender, normal bowel sounds. Trace BLE edema

## 2017-04-12 ENCOUNTER — Encounter (HOSPITAL_COMMUNITY): Payer: Self-pay

## 2017-04-12 ENCOUNTER — Other Ambulatory Visit: Payer: Self-pay | Admitting: Family Medicine

## 2017-04-12 ENCOUNTER — Ambulatory Visit (HOSPITAL_COMMUNITY)
Admit: 2017-04-12 | Discharge: 2017-04-12 | Disposition: A | Discharge status: Home / Self Care | Payer: Medicare Other | Attending: Internal Medicine | Admitting: Internal Medicine

## 2017-04-12 VITALS — BP 124/74 | HR 86 | Wt 194.2 lb

## 2017-04-12 DIAGNOSIS — R001 Bradycardia, unspecified: Secondary | ICD-10-CM | POA: Diagnosis not present

## 2017-04-12 DIAGNOSIS — Z794 Long term (current) use of insulin: Secondary | ICD-10-CM | POA: Diagnosis not present

## 2017-04-12 DIAGNOSIS — Z7901 Long term (current) use of anticoagulants: Secondary | ICD-10-CM | POA: Diagnosis not present

## 2017-04-12 DIAGNOSIS — I5032 Chronic diastolic (congestive) heart failure: Secondary | ICD-10-CM | POA: Diagnosis not present

## 2017-04-12 DIAGNOSIS — I480 Paroxysmal atrial fibrillation: Secondary | ICD-10-CM | POA: Insufficient documentation

## 2017-04-12 DIAGNOSIS — F32A Depression, unspecified: Secondary | ICD-10-CM

## 2017-04-12 DIAGNOSIS — I350 Nonrheumatic aortic (valve) stenosis: Secondary | ICD-10-CM | POA: Diagnosis not present

## 2017-04-12 DIAGNOSIS — I6529 Occlusion and stenosis of unspecified carotid artery: Secondary | ICD-10-CM | POA: Insufficient documentation

## 2017-04-12 DIAGNOSIS — M109 Gout, unspecified: Secondary | ICD-10-CM | POA: Diagnosis not present

## 2017-04-12 DIAGNOSIS — Z79899 Other long term (current) drug therapy: Secondary | ICD-10-CM | POA: Diagnosis not present

## 2017-04-12 DIAGNOSIS — I130 Hypertensive heart and chronic kidney disease with heart failure and stage 1 through stage 4 chronic kidney disease, or unspecified chronic kidney disease: Secondary | ICD-10-CM | POA: Diagnosis not present

## 2017-04-12 DIAGNOSIS — I2510 Atherosclerotic heart disease of native coronary artery without angina pectoris: Secondary | ICD-10-CM | POA: Diagnosis not present

## 2017-04-12 DIAGNOSIS — Z9181 History of falling: Secondary | ICD-10-CM | POA: Diagnosis not present

## 2017-04-12 DIAGNOSIS — F329 Major depressive disorder, single episode, unspecified: Secondary | ICD-10-CM

## 2017-04-12 DIAGNOSIS — N183 Chronic kidney disease, stage 3 unspecified: Secondary | ICD-10-CM

## 2017-04-12 DIAGNOSIS — I5022 Chronic systolic (congestive) heart failure: Secondary | ICD-10-CM | POA: Diagnosis not present

## 2017-04-12 DIAGNOSIS — Z950 Presence of cardiac pacemaker: Secondary | ICD-10-CM

## 2017-04-12 DIAGNOSIS — I4891 Unspecified atrial fibrillation: Secondary | ICD-10-CM | POA: Diagnosis not present

## 2017-04-12 DIAGNOSIS — E785 Hyperlipidemia, unspecified: Secondary | ICD-10-CM | POA: Diagnosis not present

## 2017-04-12 DIAGNOSIS — E1142 Type 2 diabetes mellitus with diabetic polyneuropathy: Secondary | ICD-10-CM | POA: Diagnosis not present

## 2017-04-12 DIAGNOSIS — I5042 Chronic combined systolic (congestive) and diastolic (congestive) heart failure: Secondary | ICD-10-CM | POA: Diagnosis not present

## 2017-04-12 DIAGNOSIS — E1122 Type 2 diabetes mellitus with diabetic chronic kidney disease: Secondary | ICD-10-CM | POA: Insufficient documentation

## 2017-04-12 DIAGNOSIS — M25572 Pain in left ankle and joints of left foot: Secondary | ICD-10-CM | POA: Diagnosis not present

## 2017-04-12 DIAGNOSIS — S99922D Unspecified injury of left foot, subsequent encounter: Secondary | ICD-10-CM | POA: Diagnosis not present

## 2017-04-12 DIAGNOSIS — R269 Unspecified abnormalities of gait and mobility: Secondary | ICD-10-CM | POA: Diagnosis not present

## 2017-04-12 DIAGNOSIS — J449 Chronic obstructive pulmonary disease, unspecified: Secondary | ICD-10-CM | POA: Diagnosis not present

## 2017-04-12 DIAGNOSIS — I951 Orthostatic hypotension: Secondary | ICD-10-CM | POA: Diagnosis not present

## 2017-04-12 NOTE — Progress Notes (Signed)
Patient ID: Danielle Benitez, female   DOB: 11/23/1931, 83 y.o.   MRN: [id number] PCP: Dr. Para March HF Cardiology: Dr. Shirlee Latch EP: Dr Ladona Ridgel   83 yo with history of HTN, orthostatic hypotension, symptomatic bradycardia with Medtronic PPM, paroxysmal atrial fibrillation chronic systolic CHF, CKD stage III, and iron deficiency anemia presents for cardiology followup. Around the beginning of 9/16, she began to develop exertional dyspnea. She was seen in the cardiology office on 07/25/15. She was in atrial fibrillation with RVR and echo showed EF 35-40% (had been 50-55% in past). PPM interrogation showed that atrial fibrillation had begun 07/11/15, so exertional dyspnea had preceded this by about 4 weeks. She was admitted from 10/7-10/17. She was anemic with hemoglobin 8.7. She was heme negative but Fe deficient. EGD was not done. She had TEE-guided DCCV to NSR. She also had AKI with creatinine up to 3.41, but it trended back down. She was sent home in NSR. She was readmitted 10/19-10/23 with recurrent atrial fibrillation and acute on chronic systolic CHF. She was diuresed about 10 lbs. It was decided to rate control her rather than repeat cardioversion, and she was sent home. she was only at home for about a day and developed increased dyspnea so came back to the ER and was re-admitted.  She was diuresed with IV Lasix and started on amiodarone.  She underwent DCCV on 10/26 but atrial fibrillation recurred on 10/29.  More amiodarone was loaded, and she went back into NSR.   Lexiscan Cardiolite in 11/16 showed basal inferior/inferolatearl fixed defect consistent with infarction but no ischemia.    Repeat echo was done 1/17.  This showed EF 45% with normal RV size and mildly decreased systolic function.   She was admitted in 7/17 with suspected infection of her device.  She had her PPM extracted and got a temporary-permanent device with later Medtronic PPM replacement. Last echo in 8/17 showed EF  55-60% with mild LVH, normal RV size and systolic function.   Admitted 02/14/17-02/16/17 with chest pain caused after she had a choking episode. Esophagram showed esophageal dysmotility with hiatal hernia. She was seen by Dr. Shirlee Latch in the hospital and felt to be euvolemic. Discharge weight was 188 pounds.   Admitted 03/31/17-04/05/17 with weight gain, slurred speech. Weight was up about 5 pounds from last clinic weight. Lasix was increased at discharge. Discharge weight was 192 pounds.   She returns today for follow up. Overall feeling well. She has PT coming to visit her in her home, working with PT without SOB. No SOB with walking into clinic, no SOB with ADL's. Her weight is up 3 pounds overnight. She reports eating high salt foods, eats bologna a couple times a day. She is drinking less than 2L a day. Her daughter is present at the visit and reports that the patients memory is not as keen as it once was.   Labs (7/16): LDL 48, HDL 55 Labs (10/16): K 3.9, creatinine 2.08 Labs (11/16): K 4.9, creatinine 2.5, LFTs normal, TSH normal, HCT 43.6 Labs (12/16): K 5.2, creatinine 1.73, HCT 41.9, plts 123 Labs (3/17): LFTs normal Labs (7/17): creatinine 1.87 Labs (8/17): LDL 57, HDL 50 Labs (9/17): K 4.9, creatinine 1.74, BNP 253 Labs (11/17): TSH normal, AST 67, ALT 37 Labs (12/17): K 4.1, creatinine 1.9, BNP 140 Labs (11/08/2016): K 4.5  Labs 94/09/2017): K 4.4 Creatinine 1.85  PMH: 1. HTN but also with history of orthostatic hypotension. 2. CKD stage III 3. Aortic stenosis: Mild  4. Symptomatic bradycardia s/p Medtronic PPM. - PPM infection 7/17 with device replacement.  5. Hyperlipidemia 6. Atrial fibrillation: Paroxysmal. TEE-guided DCCV in 10/16. She had DCCV again latera in 10/16 and was started on amiodarone.   - PFTs (11/16) with normal spirometry, severe diffusion defect c/w pulmonary vascular disease.  7. Type II diabetes with diabetic neuropathy.  8. Gout 9. Fe-deficiency anemia 10.  Chronic diastolic CHF: Echo 11/14 with EF 50-55%. Echo (10/16) with EF 35-40%, moderate LV dilation, diffuse hypokinesis, mild AS, moderate MR, PASP 48 mmHg.  Lexiscan Cardiolite (11/16) with EF 31%, basal inferior and inferolateral fixed defect possibly consistent with infarction, no ischemia; degree of LV hypokinesis was out of proportion to the perfusion defect.  Echo (1/17) with EF 45%, normal RV size with mildly decreased systolic function, aortic sclerosis without stenosis.  - Echo (8/17): EF 55-60%, mild LVH, normal RV size and systolic function.  - Echo 02/2017 EF 60-65%, grade 1 DD 11. Esophageal dysmotility 12. Carotid stenosis: Carotid dopplers (9/17) with 40-59% LICA stenosis.  13. ABIs (12/17) normal.   SH: Lives alone but daughter is nearby.  Nonsmoker. No ETOH.    FH: No premature CAD.   ROS: All systems reviewed and negative except as per HPI.    Current Outpatient Prescriptions  Medication Sig Dispense Refill  . albuterol (PROVENTIL HFA;VENTOLIN HFA) 108 (90 Base) MCG/ACT inhaler Inhale 2 puffs into the lungs every 4 (four) hours as needed for wheezing or shortness of breath (cough, shortness of breath or wheezing.). 1 Inhaler 1  . allopurinol (ZYLOPRIM) 300 MG tablet TAKE 1 TABLET BY MOUTH DAILY 90 tablet 1  . amiodarone (PACERONE) 200 MG tablet TAKE 1 TABLET BY MOUTH DAILY 90 tablet 3  . atorvastatin (LIPITOR) 20 MG tablet Take 1 tablet (20 mg total) by mouth daily at 6 PM. 30 tablet 0  . DULoxetine (CYMBALTA) 20 MG capsule TAKE ONE CAPSULE BY MOUTH EVERY NIGHT AT BEDTIME 90 capsule 1  . ELIQUIS 2.5 MG TABS tablet TAKE 1 TABLET BY MOUTH TWICE A DAY 180 tablet 3  . fluticasone (FLONASE) 50 MCG/ACT nasal spray Place 1 spray into both nostrils daily as needed for allergies or rhinitis.    . furosemide (LASIX) 40 MG tablet Take 2 tablets (80 mg total) each morning and 1 tablet (40 mg total) each evening. 30 tablet 0  . GAS RELIEF 80 MG chewable tablet CHEW ONE TABLET BY MOUTH  EVERY 6 HOURS AS NEEDED FOR GAS. 30 tablet 5  . glimepiride (AMARYL) 1 MG tablet TAKE ONE TABLET BY MOUTH EVERY MORNING WITH BREAKFAST 90 tablet 1  . Insulin Glargine (LANTUS SOLOSTAR) 100 UNIT/ML Solostar Pen Inject 26 Units into the skin daily at 10 pm.    . Insulin Pen Needle (PEN NEEDLES) 30G X 5 MM MISC Inject 1 Dose as directed daily. Use with insulin pen 100 each 3  . loratadine (CLARITIN CHILDRENS) 5 MG chewable tablet Chew 1 tablet (5 mg total) by mouth daily.    . metoprolol succinate (TOPROL-XL) 25 MG 24 hr tablet Take 1 tablet (25 mg total) by mouth 2 (two) times daily. 60 tablet 0  . Multiple Vitamins-Minerals (ONE-A-DAY 50 PLUS PO) Take 1 capsule by mouth daily.    . ONE TOUCH ULTRA TEST test strip USE TO CHECK BLOOD SUGAR ONCE A DAY AS DIRECTED 100 each 3  . ONGLYZA 2.5 MG TABS tablet TAKE 1 TABLET BY MOUTH DAILY 90 tablet 1  . pantoprazole (PROTONIX) 40 MG tablet TAKE 1  TABLET BY MOUTH DAILY 30 tablet 5  . senna-docusate (SENOKOT-S) 8.6-50 MG tablet Take 1 tablet by mouth daily as needed for mild constipation.    . traZODone (DESYREL) 50 MG tablet Take 0.5-1 tablets (25-50 mg total) by mouth at bedtime as needed for sleep. Don't take >50mg  in a day. 30 tablet 3   No current facility-administered medications for this encounter.    BP 124/74 (BP Location: Right Arm, Patient Position: Sitting, Cuff Size: Normal)   Pulse 86   Wt 194 lb 3.2 oz (88.1 kg)   SpO2 98%   BMI 37.93 kg/m  General: Elderly female, NAD. Walked into clinic without difficulty.  HEENT: Normal Neck: Supple. JVP 9 cm Carotids 2+ bilat; no bruits. No thyromegaly or nodule noted. Cor: PMI nondisplaced. RRR, No M/G/R noted Lungs: CTAB, normal effort. Abdomen: Soft, non-tender, non-distended, no HSM. No bruits or masses. +BS  Extremities: No cyanosis, clubbing, rash, 2+ pedal edema.  Neuro: Alert & orientedx3, cranial nerves grossly intact. moves all 4 extremities w/o difficulty. Affect  pleasant   Assessment/Plan: 1. Chronic combined systolic and diastolic CHF: EF 16-10% by echo 10/16. Etiology uncertain: could be tachy-mediated cardiomyopathy, but symptoms seemed to predate the onset of atrial fibrillation. Possible viral myocarditis. Cannot rule out ischemic cardiomyopathy, but no definite anginal symptoms. Cardiolite in 11/16 showed a small area possibly of prior infarction but EF and wall motion abnormalities were out of proportion to the perfusion defect. By 8/17, EF was improved to 55-60%.   - NYHA III - Volume status slightly up on exam, weight up 3 pounds overnight.  - Increase lasix to 80 mg BID for the next 2 days, then resume 80 mg in the am and 40 mg in the pm.  - Not on spiro with previous hyperkalemia.  - Continue Toprol XL 50mg  BID.   2. Atrial fibrillation:  - NSR today.  - Continue Amiodarone 200 mg daily.  - Continue Eliquis  - Denies hematochezia and melena.    3. CKD Stage III:  - BMET today.   4. Medtronic PPM for symptomatic bradycardia.   - Stable.   5. CAD:  - Possible old infarct on Myoview - No signs or symptoms of ischemia.  - No ASA with Eliquis - Continue statin, LDL 57 in Aug. 2017.     7. Carotid stenosis:  - Will need repeat carotid dopplers in 9/18.   8. HTN:  - Stable on current regimen.   Follow up with Dr. Shirlee Latch in 2 months.    Little Ishikawa NP-C  04/12/2017

## 2017-04-12 NOTE — Assessment & Plan Note (Signed)
This still seems to be the biggest issue, she is now working on a low salt diet. Her breathing is some better than admission but she is not back to her normal baseline. She clearly had a setback with this recent event. At this point she looks like she is still okay for outpatient follow-up but we talked about the fact that she has a tenuous situation and she could easily worsen again. See notes on labs, pending at time of office visit. I've asked her to consider options about what she wants, what her main goals would be at this point so that would direct her care. She agrees to talk with her family and consider. Continue physical therapy. See after visit summary. Update me if worsening. I would expect her to have some memory lapses in the meantime given her chronic illnesses, her age. Family is monitoring her in the meantime. >25 minutes spent in face to face time with patient, >50% spent in counselling or coordination of care.

## 2017-04-12 NOTE — Patient Instructions (Addendum)
Take lasix 80 mg (2 Tablets) twice daily today and tomorrow only.  Then resume taking 80 mg in the AM and 40 mg in the PM on Thursday July 5th.   Follow up with Dr. Shirlee Latch in 2 Months

## 2017-04-14 DIAGNOSIS — I2510 Atherosclerotic heart disease of native coronary artery without angina pectoris: Secondary | ICD-10-CM | POA: Diagnosis not present

## 2017-04-14 DIAGNOSIS — S99922D Unspecified injury of left foot, subsequent encounter: Secondary | ICD-10-CM | POA: Diagnosis not present

## 2017-04-14 DIAGNOSIS — R269 Unspecified abnormalities of gait and mobility: Secondary | ICD-10-CM | POA: Diagnosis not present

## 2017-04-14 DIAGNOSIS — M25572 Pain in left ankle and joints of left foot: Secondary | ICD-10-CM | POA: Diagnosis not present

## 2017-04-14 DIAGNOSIS — Z9181 History of falling: Secondary | ICD-10-CM | POA: Diagnosis not present

## 2017-04-14 DIAGNOSIS — I4891 Unspecified atrial fibrillation: Secondary | ICD-10-CM | POA: Diagnosis not present

## 2017-04-14 DIAGNOSIS — Z794 Long term (current) use of insulin: Secondary | ICD-10-CM | POA: Diagnosis not present

## 2017-04-14 DIAGNOSIS — M109 Gout, unspecified: Secondary | ICD-10-CM | POA: Diagnosis not present

## 2017-04-14 DIAGNOSIS — J449 Chronic obstructive pulmonary disease, unspecified: Secondary | ICD-10-CM | POA: Diagnosis not present

## 2017-04-14 DIAGNOSIS — I5022 Chronic systolic (congestive) heart failure: Secondary | ICD-10-CM | POA: Diagnosis not present

## 2017-04-14 DIAGNOSIS — E1142 Type 2 diabetes mellitus with diabetic polyneuropathy: Secondary | ICD-10-CM | POA: Diagnosis not present

## 2017-04-14 DIAGNOSIS — I130 Hypertensive heart and chronic kidney disease with heart failure and stage 1 through stage 4 chronic kidney disease, or unspecified chronic kidney disease: Secondary | ICD-10-CM | POA: Diagnosis not present

## 2017-04-14 DIAGNOSIS — E785 Hyperlipidemia, unspecified: Secondary | ICD-10-CM | POA: Diagnosis not present

## 2017-04-14 DIAGNOSIS — E1122 Type 2 diabetes mellitus with diabetic chronic kidney disease: Secondary | ICD-10-CM | POA: Diagnosis not present

## 2017-04-14 DIAGNOSIS — I951 Orthostatic hypotension: Secondary | ICD-10-CM | POA: Diagnosis not present

## 2017-04-14 DIAGNOSIS — N183 Chronic kidney disease, stage 3 (moderate): Secondary | ICD-10-CM | POA: Diagnosis not present

## 2017-04-19 DIAGNOSIS — Z794 Long term (current) use of insulin: Secondary | ICD-10-CM | POA: Diagnosis not present

## 2017-04-19 DIAGNOSIS — I130 Hypertensive heart and chronic kidney disease with heart failure and stage 1 through stage 4 chronic kidney disease, or unspecified chronic kidney disease: Secondary | ICD-10-CM | POA: Diagnosis not present

## 2017-04-19 DIAGNOSIS — Z9181 History of falling: Secondary | ICD-10-CM | POA: Diagnosis not present

## 2017-04-19 DIAGNOSIS — E1122 Type 2 diabetes mellitus with diabetic chronic kidney disease: Secondary | ICD-10-CM | POA: Diagnosis not present

## 2017-04-19 DIAGNOSIS — I5022 Chronic systolic (congestive) heart failure: Secondary | ICD-10-CM | POA: Diagnosis not present

## 2017-04-19 DIAGNOSIS — I2510 Atherosclerotic heart disease of native coronary artery without angina pectoris: Secondary | ICD-10-CM | POA: Diagnosis not present

## 2017-04-19 DIAGNOSIS — I4891 Unspecified atrial fibrillation: Secondary | ICD-10-CM | POA: Diagnosis not present

## 2017-04-19 DIAGNOSIS — N183 Chronic kidney disease, stage 3 (moderate): Secondary | ICD-10-CM | POA: Diagnosis not present

## 2017-04-19 DIAGNOSIS — I951 Orthostatic hypotension: Secondary | ICD-10-CM | POA: Diagnosis not present

## 2017-04-19 DIAGNOSIS — S99922D Unspecified injury of left foot, subsequent encounter: Secondary | ICD-10-CM | POA: Diagnosis not present

## 2017-04-19 DIAGNOSIS — J449 Chronic obstructive pulmonary disease, unspecified: Secondary | ICD-10-CM | POA: Diagnosis not present

## 2017-04-19 DIAGNOSIS — M109 Gout, unspecified: Secondary | ICD-10-CM | POA: Diagnosis not present

## 2017-04-19 DIAGNOSIS — R269 Unspecified abnormalities of gait and mobility: Secondary | ICD-10-CM | POA: Diagnosis not present

## 2017-04-19 DIAGNOSIS — M25572 Pain in left ankle and joints of left foot: Secondary | ICD-10-CM | POA: Diagnosis not present

## 2017-04-19 DIAGNOSIS — E785 Hyperlipidemia, unspecified: Secondary | ICD-10-CM | POA: Diagnosis not present

## 2017-04-19 DIAGNOSIS — E1142 Type 2 diabetes mellitus with diabetic polyneuropathy: Secondary | ICD-10-CM | POA: Diagnosis not present

## 2017-04-21 DIAGNOSIS — M109 Gout, unspecified: Secondary | ICD-10-CM | POA: Diagnosis not present

## 2017-04-21 DIAGNOSIS — S99922D Unspecified injury of left foot, subsequent encounter: Secondary | ICD-10-CM | POA: Diagnosis not present

## 2017-04-21 DIAGNOSIS — E1142 Type 2 diabetes mellitus with diabetic polyneuropathy: Secondary | ICD-10-CM | POA: Diagnosis not present

## 2017-04-21 DIAGNOSIS — R269 Unspecified abnormalities of gait and mobility: Secondary | ICD-10-CM | POA: Diagnosis not present

## 2017-04-21 DIAGNOSIS — N183 Chronic kidney disease, stage 3 (moderate): Secondary | ICD-10-CM | POA: Diagnosis not present

## 2017-04-21 DIAGNOSIS — I130 Hypertensive heart and chronic kidney disease with heart failure and stage 1 through stage 4 chronic kidney disease, or unspecified chronic kidney disease: Secondary | ICD-10-CM | POA: Diagnosis not present

## 2017-04-21 DIAGNOSIS — E1122 Type 2 diabetes mellitus with diabetic chronic kidney disease: Secondary | ICD-10-CM | POA: Diagnosis not present

## 2017-04-21 DIAGNOSIS — I951 Orthostatic hypotension: Secondary | ICD-10-CM | POA: Diagnosis not present

## 2017-04-21 DIAGNOSIS — M25572 Pain in left ankle and joints of left foot: Secondary | ICD-10-CM | POA: Diagnosis not present

## 2017-04-21 DIAGNOSIS — I2510 Atherosclerotic heart disease of native coronary artery without angina pectoris: Secondary | ICD-10-CM | POA: Diagnosis not present

## 2017-04-21 DIAGNOSIS — Z794 Long term (current) use of insulin: Secondary | ICD-10-CM | POA: Diagnosis not present

## 2017-04-21 DIAGNOSIS — I5022 Chronic systolic (congestive) heart failure: Secondary | ICD-10-CM | POA: Diagnosis not present

## 2017-04-21 DIAGNOSIS — I4891 Unspecified atrial fibrillation: Secondary | ICD-10-CM | POA: Diagnosis not present

## 2017-04-21 DIAGNOSIS — E785 Hyperlipidemia, unspecified: Secondary | ICD-10-CM | POA: Diagnosis not present

## 2017-04-21 DIAGNOSIS — Z9181 History of falling: Secondary | ICD-10-CM | POA: Diagnosis not present

## 2017-04-21 DIAGNOSIS — J449 Chronic obstructive pulmonary disease, unspecified: Secondary | ICD-10-CM | POA: Diagnosis not present

## 2017-04-22 ENCOUNTER — Telehealth: Payer: Self-pay

## 2017-04-22 DIAGNOSIS — Z9181 History of falling: Secondary | ICD-10-CM | POA: Diagnosis not present

## 2017-04-22 DIAGNOSIS — I4891 Unspecified atrial fibrillation: Secondary | ICD-10-CM | POA: Diagnosis not present

## 2017-04-22 DIAGNOSIS — J449 Chronic obstructive pulmonary disease, unspecified: Secondary | ICD-10-CM | POA: Diagnosis not present

## 2017-04-22 DIAGNOSIS — E1122 Type 2 diabetes mellitus with diabetic chronic kidney disease: Secondary | ICD-10-CM | POA: Diagnosis not present

## 2017-04-22 DIAGNOSIS — I130 Hypertensive heart and chronic kidney disease with heart failure and stage 1 through stage 4 chronic kidney disease, or unspecified chronic kidney disease: Secondary | ICD-10-CM | POA: Diagnosis not present

## 2017-04-22 DIAGNOSIS — M1990 Unspecified osteoarthritis, unspecified site: Secondary | ICD-10-CM | POA: Diagnosis not present

## 2017-04-22 DIAGNOSIS — N183 Chronic kidney disease, stage 3 (moderate): Secondary | ICD-10-CM | POA: Diagnosis not present

## 2017-04-22 DIAGNOSIS — Z950 Presence of cardiac pacemaker: Secondary | ICD-10-CM | POA: Diagnosis not present

## 2017-04-22 DIAGNOSIS — I2510 Atherosclerotic heart disease of native coronary artery without angina pectoris: Secondary | ICD-10-CM | POA: Diagnosis not present

## 2017-04-22 DIAGNOSIS — F329 Major depressive disorder, single episode, unspecified: Secondary | ICD-10-CM

## 2017-04-22 DIAGNOSIS — E1142 Type 2 diabetes mellitus with diabetic polyneuropathy: Secondary | ICD-10-CM | POA: Diagnosis not present

## 2017-04-22 DIAGNOSIS — I5022 Chronic systolic (congestive) heart failure: Secondary | ICD-10-CM | POA: Diagnosis not present

## 2017-04-22 DIAGNOSIS — Z794 Long term (current) use of insulin: Secondary | ICD-10-CM | POA: Diagnosis not present

## 2017-04-22 DIAGNOSIS — F419 Anxiety disorder, unspecified: Secondary | ICD-10-CM

## 2017-04-22 NOTE — Telephone Encounter (Signed)
Laura advised.

## 2017-04-22 NOTE — Telephone Encounter (Signed)
Danielle Benitez with [facility] left v/m requesting verbal orders for continuing [facility] PT 2 x a week for 6 weeks.

## 2017-04-22 NOTE — Telephone Encounter (Signed)
Please give the order.  Thanks.   

## 2017-04-26 DIAGNOSIS — I4891 Unspecified atrial fibrillation: Secondary | ICD-10-CM | POA: Diagnosis not present

## 2017-04-26 DIAGNOSIS — N183 Chronic kidney disease, stage 3 (moderate): Secondary | ICD-10-CM | POA: Diagnosis not present

## 2017-04-26 DIAGNOSIS — Z9181 History of falling: Secondary | ICD-10-CM | POA: Diagnosis not present

## 2017-04-26 DIAGNOSIS — I2510 Atherosclerotic heart disease of native coronary artery without angina pectoris: Secondary | ICD-10-CM | POA: Diagnosis not present

## 2017-04-26 DIAGNOSIS — I130 Hypertensive heart and chronic kidney disease with heart failure and stage 1 through stage 4 chronic kidney disease, or unspecified chronic kidney disease: Secondary | ICD-10-CM | POA: Diagnosis not present

## 2017-04-26 DIAGNOSIS — I5022 Chronic systolic (congestive) heart failure: Secondary | ICD-10-CM | POA: Diagnosis not present

## 2017-04-26 DIAGNOSIS — J449 Chronic obstructive pulmonary disease, unspecified: Secondary | ICD-10-CM | POA: Diagnosis not present

## 2017-04-26 DIAGNOSIS — E1142 Type 2 diabetes mellitus with diabetic polyneuropathy: Secondary | ICD-10-CM | POA: Diagnosis not present

## 2017-04-26 DIAGNOSIS — E1122 Type 2 diabetes mellitus with diabetic chronic kidney disease: Secondary | ICD-10-CM | POA: Diagnosis not present

## 2017-04-26 DIAGNOSIS — Z950 Presence of cardiac pacemaker: Secondary | ICD-10-CM | POA: Diagnosis not present

## 2017-04-26 DIAGNOSIS — M1990 Unspecified osteoarthritis, unspecified site: Secondary | ICD-10-CM | POA: Diagnosis not present

## 2017-04-26 DIAGNOSIS — Z794 Long term (current) use of insulin: Secondary | ICD-10-CM | POA: Diagnosis not present

## 2017-04-28 ENCOUNTER — Encounter (HOSPITAL_COMMUNITY): Payer: Self-pay

## 2017-04-28 ENCOUNTER — Encounter: Payer: Self-pay | Admitting: Family Medicine

## 2017-04-28 ENCOUNTER — Ambulatory Visit: Payer: Medicare Other | Admitting: Family Medicine

## 2017-04-28 ENCOUNTER — Emergency Department (HOSPITAL_COMMUNITY): Payer: Medicare Other

## 2017-04-28 ENCOUNTER — Inpatient Hospital Stay (HOSPITAL_COMMUNITY)
Admission: EM | Admit: 2017-04-28 | Discharge: 2017-05-02 | DRG: 689 | Disposition: A | Discharge status: Home Health | Payer: Medicare Other | Attending: Internal Medicine | Admitting: Internal Medicine

## 2017-04-28 DIAGNOSIS — D696 Thrombocytopenia, unspecified: Secondary | ICD-10-CM | POA: Diagnosis not present

## 2017-04-28 DIAGNOSIS — I5032 Chronic diastolic (congestive) heart failure: Secondary | ICD-10-CM | POA: Diagnosis not present

## 2017-04-28 DIAGNOSIS — N390 Urinary tract infection, site not specified: Principal | ICD-10-CM | POA: Diagnosis present

## 2017-04-28 DIAGNOSIS — I130 Hypertensive heart and chronic kidney disease with heart failure and stage 1 through stage 4 chronic kidney disease, or unspecified chronic kidney disease: Secondary | ICD-10-CM | POA: Diagnosis present

## 2017-04-28 DIAGNOSIS — F329 Major depressive disorder, single episode, unspecified: Secondary | ICD-10-CM | POA: Diagnosis present

## 2017-04-28 DIAGNOSIS — K219 Gastro-esophageal reflux disease without esophagitis: Secondary | ICD-10-CM | POA: Diagnosis not present

## 2017-04-28 DIAGNOSIS — N183 Chronic kidney disease, stage 3 unspecified: Secondary | ICD-10-CM

## 2017-04-28 DIAGNOSIS — Z7951 Long term (current) use of inhaled steroids: Secondary | ICD-10-CM

## 2017-04-28 DIAGNOSIS — E871 Hypo-osmolality and hyponatremia: Secondary | ICD-10-CM

## 2017-04-28 DIAGNOSIS — I252 Old myocardial infarction: Secondary | ICD-10-CM | POA: Diagnosis not present

## 2017-04-28 DIAGNOSIS — E1151 Type 2 diabetes mellitus with diabetic peripheral angiopathy without gangrene: Secondary | ICD-10-CM | POA: Diagnosis not present

## 2017-04-28 DIAGNOSIS — N179 Acute kidney failure, unspecified: Secondary | ICD-10-CM

## 2017-04-28 DIAGNOSIS — N184 Chronic kidney disease, stage 4 (severe): Secondary | ICD-10-CM | POA: Diagnosis not present

## 2017-04-28 DIAGNOSIS — Z8249 Family history of ischemic heart disease and other diseases of the circulatory system: Secondary | ICD-10-CM

## 2017-04-28 DIAGNOSIS — I360 Nonrheumatic tricuspid (valve) stenosis: Secondary | ICD-10-CM | POA: Diagnosis not present

## 2017-04-28 DIAGNOSIS — Z950 Presence of cardiac pacemaker: Secondary | ICD-10-CM

## 2017-04-28 DIAGNOSIS — I4891 Unspecified atrial fibrillation: Secondary | ICD-10-CM | POA: Diagnosis not present

## 2017-04-28 DIAGNOSIS — I480 Paroxysmal atrial fibrillation: Secondary | ICD-10-CM | POA: Diagnosis not present

## 2017-04-28 DIAGNOSIS — Z8673 Personal history of transient ischemic attack (TIA), and cerebral infarction without residual deficits: Secondary | ICD-10-CM

## 2017-04-28 DIAGNOSIS — Z7901 Long term (current) use of anticoagulants: Secondary | ICD-10-CM | POA: Diagnosis not present

## 2017-04-28 DIAGNOSIS — E1122 Type 2 diabetes mellitus with diabetic chronic kidney disease: Secondary | ICD-10-CM | POA: Diagnosis present

## 2017-04-28 DIAGNOSIS — Z794 Long term (current) use of insulin: Secondary | ICD-10-CM | POA: Diagnosis not present

## 2017-04-28 DIAGNOSIS — Z833 Family history of diabetes mellitus: Secondary | ICD-10-CM | POA: Diagnosis not present

## 2017-04-28 DIAGNOSIS — D631 Anemia in chronic kidney disease: Secondary | ICD-10-CM | POA: Diagnosis not present

## 2017-04-28 DIAGNOSIS — R410 Disorientation, unspecified: Secondary | ICD-10-CM | POA: Diagnosis not present

## 2017-04-28 DIAGNOSIS — R4182 Altered mental status, unspecified: Secondary | ICD-10-CM | POA: Insufficient documentation

## 2017-04-28 DIAGNOSIS — Z9181 History of falling: Secondary | ICD-10-CM | POA: Diagnosis not present

## 2017-04-28 DIAGNOSIS — Z1623 Resistance to quinolones and fluoroquinolones: Secondary | ICD-10-CM | POA: Diagnosis present

## 2017-04-28 DIAGNOSIS — E1121 Type 2 diabetes mellitus with diabetic nephropathy: Secondary | ICD-10-CM | POA: Diagnosis not present

## 2017-04-28 DIAGNOSIS — E1169 Type 2 diabetes mellitus with other specified complication: Secondary | ICD-10-CM | POA: Diagnosis present

## 2017-04-28 DIAGNOSIS — E1142 Type 2 diabetes mellitus with diabetic polyneuropathy: Secondary | ICD-10-CM | POA: Diagnosis not present

## 2017-04-28 DIAGNOSIS — G9341 Metabolic encephalopathy: Secondary | ICD-10-CM | POA: Diagnosis present

## 2017-04-28 DIAGNOSIS — F411 Generalized anxiety disorder: Secondary | ICD-10-CM | POA: Diagnosis present

## 2017-04-28 DIAGNOSIS — I2510 Atherosclerotic heart disease of native coronary artery without angina pectoris: Secondary | ICD-10-CM | POA: Diagnosis present

## 2017-04-28 DIAGNOSIS — R402441 Other coma, without documented Glasgow coma scale score, or with partial score reported, in the field [EMT or ambulance]: Secondary | ICD-10-CM | POA: Diagnosis not present

## 2017-04-28 DIAGNOSIS — D62 Acute posthemorrhagic anemia: Secondary | ICD-10-CM

## 2017-04-28 DIAGNOSIS — M1990 Unspecified osteoarthritis, unspecified site: Secondary | ICD-10-CM | POA: Diagnosis not present

## 2017-04-28 DIAGNOSIS — I639 Cerebral infarction, unspecified: Secondary | ICD-10-CM

## 2017-04-28 DIAGNOSIS — J449 Chronic obstructive pulmonary disease, unspecified: Secondary | ICD-10-CM | POA: Diagnosis not present

## 2017-04-28 DIAGNOSIS — B9620 Unspecified Escherichia coli [E. coli] as the cause of diseases classified elsewhere: Secondary | ICD-10-CM | POA: Diagnosis present

## 2017-04-28 DIAGNOSIS — I5022 Chronic systolic (congestive) heart failure: Secondary | ICD-10-CM | POA: Diagnosis not present

## 2017-04-28 DIAGNOSIS — E785 Hyperlipidemia, unspecified: Secondary | ICD-10-CM | POA: Diagnosis not present

## 2017-04-28 HISTORY — DX: Urinary tract infection, site not specified: N39.0

## 2017-04-28 LAB — COMPREHENSIVE METABOLIC PANEL
ALBUMIN: 2.7 g/dL — AB (ref 3.5–5.0)
ALK PHOS: 138 U/L — AB (ref 38–126)
ALT: 32 U/L (ref 14–54)
ANION GAP: 10 (ref 5–15)
AST: 82 U/L — ABNORMAL HIGH (ref 15–41)
BILIRUBIN TOTAL: 1.2 mg/dL (ref 0.3–1.2)
BUN: 36 mg/dL — ABNORMAL HIGH (ref 6–20)
CALCIUM: 8.9 mg/dL (ref 8.9–10.3)
CO2: 27 mmol/L (ref 22–32)
Chloride: 105 mmol/L (ref 101–111)
Creatinine, Ser: 2.09 mg/dL — ABNORMAL HIGH (ref 0.44–1.00)
GFR calc non Af Amer: 21 mL/min — ABNORMAL LOW (ref >60)
GFR, EST AFRICAN AMERICAN: 24 mL/min — AB (ref >60)
GLUCOSE: 171 mg/dL — AB (ref 65–99)
POTASSIUM: 4.1 mmol/L (ref 3.5–5.1)
Sodium: 142 mmol/L (ref 135–145)
TOTAL PROTEIN: 6.9 g/dL (ref 6.5–8.1)

## 2017-04-28 LAB — CBC WITH DIFFERENTIAL/PLATELET
BASOS ABS: 0 10*3/uL (ref 0.0–0.1)
BASOS PCT: 0 %
Eosinophils Absolute: 0.1 10*3/uL (ref 0.0–0.7)
Eosinophils Relative: 1 %
HEMATOCRIT: 34.7 % — AB (ref 36.0–46.0)
HEMOGLOBIN: 10.7 g/dL — AB (ref 12.0–15.0)
LYMPHS PCT: 21 %
Lymphs Abs: 1.7 10*3/uL (ref 0.7–4.0)
MCH: 28.3 pg (ref 26.0–34.0)
MCHC: 30.8 g/dL (ref 30.0–36.0)
MCV: 91.8 fL (ref 78.0–100.0)
MONO ABS: 0.9 10*3/uL (ref 0.1–1.0)
Monocytes Relative: 10 %
NEUTROS ABS: 5.5 10*3/uL (ref 1.7–7.7)
NEUTROS PCT: 68 %
Platelets: 81 10*3/uL — ABNORMAL LOW (ref 150–400)
RBC: 3.78 MIL/uL — AB (ref 3.87–5.11)
RDW: 18.6 % — AB (ref 11.5–15.5)
WBC: 8.2 10*3/uL (ref 4.0–10.5)

## 2017-04-28 LAB — URINALYSIS, ROUTINE W REFLEX MICROSCOPIC
BILIRUBIN URINE: NEGATIVE
GLUCOSE, UA: NEGATIVE mg/dL
Ketones, ur: NEGATIVE mg/dL
NITRITE: NEGATIVE
PROTEIN: NEGATIVE mg/dL
Specific Gravity, Urine: 1.008 (ref 1.005–1.030)
pH: 7 (ref 5.0–8.0)

## 2017-04-28 LAB — I-STAT CG4 LACTIC ACID, ED: Lactic Acid, Venous: 2 mmol/L (ref 0.5–1.9)

## 2017-04-28 LAB — GLUCOSE, CAPILLARY: Glucose-Capillary: 226 mg/dL — ABNORMAL HIGH (ref 65–99)

## 2017-04-28 MED ORDER — ONDANSETRON HCL 4 MG/2ML IJ SOLN
4.0000 mg | Freq: Four times a day (QID) | INTRAMUSCULAR | Status: DC | PRN
  Administered 2017-04-30: 4 mg via INTRAVENOUS
  Filled 2017-04-28 (×2): qty 2

## 2017-04-28 MED ORDER — INSULIN GLARGINE 100 UNIT/ML ~~LOC~~ SOLN
26.0000 [IU] | Freq: Every day | SUBCUTANEOUS | Status: DC
  Administered 2017-04-28 – 2017-05-01 (×4): 26 [IU] via SUBCUTANEOUS
  Filled 2017-04-28 (×4): qty 0.26

## 2017-04-28 MED ORDER — ALLOPURINOL 300 MG PO TABS
300.0000 mg | ORAL_TABLET | Freq: Every day | ORAL | Status: DC
  Administered 2017-04-29 – 2017-05-02 (×4): 300 mg via ORAL
  Filled 2017-04-28 (×4): qty 1

## 2017-04-28 MED ORDER — DEXTROSE 5 % IV SOLN
1.0000 g | Freq: Once | INTRAVENOUS | Status: AC
  Administered 2017-04-28: 1 g via INTRAVENOUS
  Filled 2017-04-28: qty 10

## 2017-04-28 MED ORDER — ASPIRIN 325 MG PO TABS: 325.0000 mg | ORAL_TABLET | Freq: Every day | ORAL | Status: DC

## 2017-04-28 MED ORDER — ONDANSETRON HCL 4 MG PO TABS: 4.0000 mg | ORAL_TABLET | Freq: Four times a day (QID) | ORAL | Status: DC | PRN

## 2017-04-28 MED ORDER — FUROSEMIDE 40 MG PO TABS
40.0000 mg | ORAL_TABLET | Freq: Every evening | ORAL | Status: DC
  Administered 2017-04-29 – 2017-04-30 (×2): 40 mg via ORAL
  Filled 2017-04-28 (×2): qty 1

## 2017-04-28 MED ORDER — LORATADINE 10 MG PO TABS
5.0000 mg | ORAL_TABLET | Freq: Every day | ORAL | Status: DC
  Administered 2017-04-29 – 2017-05-02 (×4): 5 mg via ORAL
  Filled 2017-04-28 (×4): qty 1

## 2017-04-28 MED ORDER — DULOXETINE HCL 20 MG PO CPEP
20.0000 mg | ORAL_CAPSULE | Freq: Every day | ORAL | Status: DC
Start: 2017-04-28 — End: 2017-05-02
  Administered 2017-04-28 – 2017-05-01 (×4): 20 mg via ORAL
  Filled 2017-04-28 (×4): qty 1

## 2017-04-28 MED ORDER — METOPROLOL SUCCINATE ER 25 MG PO TB24
25.0000 mg | ORAL_TABLET | Freq: Two times a day (BID) | ORAL | Status: DC
  Administered 2017-04-28 – 2017-05-02 (×8): 25 mg via ORAL
  Filled 2017-04-28 (×8): qty 1

## 2017-04-28 MED ORDER — FLUTICASONE PROPIONATE 50 MCG/ACT NA SUSP
1.0000 | Freq: Every day | NASAL | Status: DC | PRN
  Filled 2017-04-28: qty 16

## 2017-04-28 MED ORDER — ASPIRIN 300 MG RE SUPP: 300.0000 mg | Freq: Every day | RECTAL | Status: DC

## 2017-04-28 MED ORDER — ATORVASTATIN CALCIUM 20 MG PO TABS
20.0000 mg | ORAL_TABLET | Freq: Every day | ORAL | Status: DC
  Administered 2017-04-29 – 2017-05-01 (×3): 20 mg via ORAL
  Filled 2017-04-28 (×3): qty 1

## 2017-04-28 MED ORDER — STROKE: EARLY STAGES OF RECOVERY BOOK
Freq: Once | Status: AC
  Administered 2017-04-28: 1
  Filled 2017-04-28: qty 1

## 2017-04-28 MED ORDER — TRAZODONE HCL 50 MG PO TABS
25.0000 mg | ORAL_TABLET | Freq: Every evening | ORAL | Status: DC | PRN
  Administered 2017-04-28 – 2017-05-01 (×3): 50 mg via ORAL
  Filled 2017-04-28 (×3): qty 1

## 2017-04-28 MED ORDER — LINAGLIPTIN 5 MG PO TABS
5.0000 mg | ORAL_TABLET | Freq: Every day | ORAL | Status: DC
  Administered 2017-04-29 – 2017-05-02 (×4): 5 mg via ORAL
  Filled 2017-04-28 (×4): qty 1

## 2017-04-28 MED ORDER — GLIMEPIRIDE 1 MG PO TABS
1.0000 mg | ORAL_TABLET | Freq: Every day | ORAL | Status: DC
  Administered 2017-04-29 – 2017-05-02 (×4): 1 mg via ORAL
  Filled 2017-04-28 (×4): qty 1

## 2017-04-28 MED ORDER — SODIUM CHLORIDE 0.9 % IV SOLN
INTRAVENOUS | Status: DC
  Administered 2017-04-28 – 2017-04-29 (×2): via INTRAVENOUS

## 2017-04-28 MED ORDER — APIXABAN 2.5 MG PO TABS
2.5000 mg | ORAL_TABLET | Freq: Two times a day (BID) | ORAL | Status: DC
  Administered 2017-04-28 – 2017-05-02 (×8): 2.5 mg via ORAL
  Filled 2017-04-28 (×8): qty 1

## 2017-04-28 MED ORDER — SENNOSIDES-DOCUSATE SODIUM 8.6-50 MG PO TABS
1.0000 | ORAL_TABLET | Freq: Every day | ORAL | Status: DC | PRN
  Filled 2017-04-28: qty 1

## 2017-04-28 MED ORDER — AMIODARONE HCL 200 MG PO TABS
200.0000 mg | ORAL_TABLET | Freq: Every day | ORAL | Status: DC
Start: 2017-04-29 — End: 2017-05-02
  Administered 2017-04-29 – 2017-05-02 (×4): 200 mg via ORAL
  Filled 2017-04-28 (×4): qty 1

## 2017-04-28 MED ORDER — PANTOPRAZOLE SODIUM 40 MG PO TBEC
40.0000 mg | DELAYED_RELEASE_TABLET | Freq: Every day | ORAL | Status: DC
  Administered 2017-04-29 – 2017-05-02 (×4): 40 mg via ORAL
  Filled 2017-04-28 (×4): qty 1

## 2017-04-28 MED ORDER — DEXTROSE 5 % IV SOLN
1.0000 g | INTRAVENOUS | Status: DC
  Administered 2017-04-29 – 2017-05-01 (×3): 1 g via INTRAVENOUS
  Filled 2017-04-28 (×4): qty 10

## 2017-04-28 MED ORDER — INSULIN ASPART 100 UNIT/ML ~~LOC~~ SOLN
0.0000 [IU] | Freq: Three times a day (TID) | SUBCUTANEOUS | Status: DC
  Administered 2017-04-29: 3 [IU] via SUBCUTANEOUS
  Administered 2017-04-29 – 2017-04-30 (×2): 5 [IU] via SUBCUTANEOUS
  Administered 2017-04-30: 1 [IU] via SUBCUTANEOUS
  Administered 2017-04-30: 2 [IU] via SUBCUTANEOUS
  Administered 2017-05-01: 3 [IU] via SUBCUTANEOUS
  Administered 2017-05-01: 2 [IU] via SUBCUTANEOUS
  Administered 2017-05-01: 3 [IU] via SUBCUTANEOUS
  Administered 2017-05-02 (×2): 2 [IU] via SUBCUTANEOUS

## 2017-04-28 NOTE — ED Provider Notes (Signed)
[facility]-EMERGENCY DEPT Provider Note   CSN: [id number] Arrival date & time: 04/28/17  1452     History   Chief Complaint Chief Complaint  Patient presents with  . Altered Mental Status    HPI Danielle Benitez is a 83 y.o. female.  HPI  Patient presents to the ED for altered mental status. Last known normal was 3-1/2 hours ago. The following history is provided by family members. They state that patient woke up around 9 AM this morning and was able to walk normally on her walker but complained of headache and dysuria. She is states that she then went back to sleep which is normal for her. She then woke up with altered mental status and right-sided weakness. They state that she was slumped over on her right side and was unable to walk. They report she is normally able to walk, get dressed, eat on her own and has no weakness. They deny any recent injury, head injury, recent illness.  Past Medical History:  Diagnosis Date  . Abnormality of gait   . Anemia, unspecified   . Anticoagulated- Eliquis 07/31/2015  . Anxiety state, unspecified   . Aortic stenosis    a. Mild by echo 08/2013, not seen on repeat echo 02/2017.  Marland Kitchen Bifascicular block    afib  . Carotid artery disease (HCC)    a. Carotid dopplers (9/17) with 40-59% LICA stenosis (of note, prior ABI normal).  . Chronic diastolic CHF (congestive heart failure) (HCC)    a. Dx 07/2015 - EF 35-40%, unclear etiology ? r/t afib or viral etiology. b. EF normal in 02/2017.  . CKD (chronic kidney disease), stage III   . Depressive disorder, not elsewhere classified   . DM (diabetes mellitus), type 2, uncontrolled, with renal complications (HCC) 01/08/2012  . Eczema 11/22/2013  . Esophageal dysmotility 08/05/2015  . Essential hypertension   . Female stress incontinence 05/06/2015  . Full dentures   . GERD (gastroesophageal reflux disease)   . Gout   . HOH (hard of hearing)   . Internal hemorrhoids without mention of complication   . Iron  deficiency   . Lumbago   . Memory loss   . Mild CAD    a. Cath 2013: 20-30% prox RCA.  Marland Kitchen Myocardial infarction (HCC) 07/2015  . Nonspecific abnormal results of liver function study   . Obesity, unspecified   . Orthostatic hypotension   . Osteoarthritis   . Other and unspecified hyperlipidemia   . PAF (paroxysmal atrial fibrillation) (HCC) 07/11/2015   a. prior DCCVs, amiodarone, anticoagulation.  . Pain in joint, shoulder region 11/22/2013   left   . Personal history of fall   . Presence of permanent cardiac pacemaker   . Pruritus 11/22/2013  . S/P cardiac pacemaker procedure 01/11/2012   a. for symptomatic bradycardia. Followed by Dr. Ladona Ridgel. Device changed out 2017 due to ppm infection.  . Stroke [facility])    a. evidence of prior strokes on MRI in 2018.  . Wears glasses     Patient Active Problem List   Diagnosis Date Noted  . UTI (urinary tract infection) 04/28/2017  . TIA (transient ischemic attack) 03/31/2017  . Thrombocytopenia (HCC) 03/31/2017  . Acute on chronic diastolic CHF (congestive heart failure) (HCC) 03/31/2017  . Ankle pain 02/24/2017  . Chest pain 02/14/2017  . Dysuria 09/21/2016  . Fall 06/25/2016  . Skin tear of elbow without complication 06/24/2016  . Injury of left shoulder and upper arm 06/24/2016  . Unequal blood  pressure in upper extremities 06/03/2016  . Cardiac pacemaker in situ 04/12/2016  . Cough 03/08/2016  . Abnormality of gait 03/05/2016  . Risk for falls 03/05/2016  . Insomnia 01/21/2016  . Chronic systolic congestive heart failure, NYHA class 3 (HCC) 01/20/2016  . LFT elevation 11/21/2015  . Weakness 11/06/2015  . Dysphagia   . Esophageal dysmotility 08/05/2015  . Anticoagulated- Eliquis 07/31/2015  . Normochromic normocytic anemia 07/31/2015  . Atrial fibrillation - 07/11/15 by PPM interrogation, recurrent 10/19 after DCCV 10/14 07/18/2015  . Moderate mitral regurgitation 07/18/2015  . Moderate tricuspid regurgitation 07/18/2015  .  History of pacemaker - Medtronic 07/18/2015  . CKD (chronic kidney disease), stage III   . Orthostatic hypotension   . Essential hypertension   . Pericardial effusion 07/15/2015  . Female stress incontinence 05/06/2015  . Knee pain 12/18/2014  . Low back pain 02/27/2014  . Obese 02/27/2014  . Depression 02/27/2014  . Pain in joint, shoulder region 11/22/2013  . Eczema 11/22/2013  . S/P cardiac pacemaker procedure, PPM Medtronic REVO placed 01/11/2012 01/11/2012  . COPD bronchitis by CXR 01/10/2012  . Dyslipidemia 01/10/2012  . Mild CAD - 20-30% RCA in 2013 01/10/2012  . Fatigue 01/10/2012  . Sinus bradycardia, HR as low As 35 01/10/2012  . Mild aortic stenosis 01/10/2012  . Chest wall pain 01/08/2012  . SOB (shortness of breath) 01/08/2012  . DM, type 2, with renal complications  01/08/2012    Past Surgical History:  Procedure Laterality Date  . CARDIAC CATHETERIZATION  12/03/2003   Dr Clarene Duke  . CARDIOVERSION N/A 07/25/2015   Procedure: CARDIOVERSION;  Surgeon: Pricilla Riffle, MD;  Location: [facility] ENDOSCOPY;  Service: Cardiovascular;  Laterality: N/A;  . CARDIOVERSION N/A 08/06/2015   Procedure: CARDIOVERSION;  Surgeon: Laurey Morale, MD;  Location: [facility] ENDOSCOPY;  Service: Cardiovascular;  Laterality: N/A;  . CATARACT EXTRACTION W/ INTRAOCULAR LENS  IMPLANT, BILATERAL Bilateral 03/2015-04/2015  . COLONOSCOPY     ?????  . EP IMPLANTABLE DEVICE N/A 04/15/2016   Procedure: Pacemaker Implant;  Surgeon: Marinus Maw, MD;  Location: [facility] INVASIVE CV LAB;  Service: Cardiovascular;  Laterality: N/A;  . ESOPHAGOGASTRODUODENOSCOPY N/A 08/08/2015   Procedure: ESOPHAGOGASTRODUODENOSCOPY (EGD);  Surgeon: Iva Boop, MD;  Location: [facility] ENDOSCOPY;  Service: Endoscopy;  Laterality: N/A;  . EXTREMITY CYST EXCISION     finger  . EYE SURGERY Bilateral    catartact  . INSERT / REPLACE / REMOVE PACEMAKER    . LEFT HEART CATHETERIZATION WITH CORONARY ANGIOGRAM N/A 01/10/2012   Procedure: LEFT HEART  CATHETERIZATION WITH CORONARY ANGIOGRAM;  Surgeon: Chrystie Nose, MD;  Location: [facility] CATH LAB;  Service: Cardiovascular;  Laterality: N/A;  . ORIF WRIST FRACTURE Right 04/09/2014   Procedure: OPEN REDUCTION INTERNAL FIXATION (ORIF) RIGHT DISPLACED  DISTAL RADIUS  FRACTURE;  Surgeon: Dominica Severin, MD;  Location: [facility];  Service: Orthopedics;  Laterality: Right;  . PACEMAKER LEAD REMOVAL  04/12/2016   TEMPORARY PACEMAKER INSERTION  . PACEMAKER LEAD REMOVAL  04/15/2016   Procedure: Pacemaker Lead Removal;  Surgeon: Marinus Maw, MD;  Location: [facility] INVASIVE CV LAB;  Service: Cardiovascular;;  . PACEMAKER LEAD REMOVAL N/A 04/12/2016   Procedure: PACEMAKER LEAD REMOVAL;  Surgeon: Marinus Maw, MD;  Location: [facility] OR;  Service: Cardiovascular;  Laterality: N/A;  . PERMANENT PACEMAKER INSERTION Left 01/11/2012   Procedure: PERMANENT PACEMAKER INSERTION;  Surgeon: Thurmon Fair, MD;  Location: [facility] CATH LAB;  Service: Cardiovascular;  Laterality: Left;  . TEE WITHOUT CARDIOVERSION  N/A 07/25/2015   Procedure: TRANSESOPHAGEAL ECHOCARDIOGRAM (TEE);  Surgeon: Pricilla Riffle, MD;  Location: [facility] ENDOSCOPY;  Service: Cardiovascular;  Laterality: N/A;    OB History    No data available       Home Medications    Prior to Admission medications   Medication Sig Start Date End Date Taking? Authorizing Provider  albuterol (PROVENTIL HFA;VENTOLIN HFA) 108 (90 Base) MCG/ACT inhaler Inhale 2 puffs into the lungs every 4 (four) hours as needed for wheezing or shortness of breath (cough, shortness of breath or wheezing.). 10/15/16  Yes Emi Belfast, FNP  allopurinol (ZYLOPRIM) 300 MG tablet TAKE 1 TABLET BY MOUTH DAILY Patient taking differently: TAKE 300MG  BY MOUTH DAILY 11/24/16  Yes Joaquim Nam, MD  amiodarone (PACERONE) 200 MG tablet TAKE 1 TABLET BY MOUTH DAILY Patient taking differently: TAKE 200MG  BY MOUTH DAILY 02/14/17  Yes Laurey Morale, MD  atorvastatin (LIPITOR) 20 MG tablet Take  1 tablet (20 mg total) by mouth daily at 6 PM. 04/06/17  Yes Penny Pia, MD  DULoxetine (CYMBALTA) 20 MG capsule TAKE ONE CAPSULE BY MOUTH EVERY NIGHT AT BEDTIME Patient taking differently: TAKE 20MG  BY MOUTH EVERY NIGHT AT BEDTIME 04/12/17  Yes Joaquim Nam, MD  ELIQUIS 2.5 MG TABS tablet TAKE 1 TABLET BY MOUTH TWICE A DAY Patient taking differently: TAKE 2.5MG  BY MOUTH TWICE A DAY 02/14/17  Yes Bensimhon, Bevelyn Buckles, MD  fluticasone (FLONASE) 50 MCG/ACT nasal spray Place 1 spray into both nostrils daily as needed for allergies or rhinitis.   Yes [provider]  furosemide (LASIX) 40 MG tablet Take 2 tablets (80 mg total) each morning and 1 tablet (40 mg total) each evening. Patient taking differently: Take 40 mg by mouth every evening.  04/11/17  Yes Joaquim Nam, MD  furosemide (LASIX) 80 MG tablet Take 80 mg by mouth daily with breakfast.   Yes [provider]  GAS RELIEF 80 MG chewable tablet CHEW ONE TABLET BY MOUTH EVERY 6 HOURS AS NEEDED FOR GAS. 11/06/16  Yes Joaquim Nam, MD  glimepiride (AMARYL) 1 MG tablet TAKE ONE TABLET BY MOUTH EVERY MORNING WITH BREAKFAST Patient taking differently: TAKE 1MG  BY MOUTH EVERY MORNING WITH BREAKFAST 04/07/17  Yes Joaquim Nam, MD  Insulin Glargine (LANTUS SOLOSTAR) 100 UNIT/ML Solostar Pen Inject 26 Units into the skin daily at 10 pm. Patient taking differently: Inject 25-27 Units into the skin See admin instructions. Daily at 10pm. Per sliding scale, based on the total units she got the night before and what blood sugar level currently is. If blood sugar 100-150 give same units as last time, greater than 151 increase by 1 unit. 04/11/17  Yes Joaquim Nam, MD  loratadine (CLARITIN CHILDRENS) 5 MG chewable tablet Chew 1 tablet (5 mg total) by mouth daily. 01/26/17  Yes Joaquim Nam, MD  metoprolol succinate (TOPROL-XL) 25 MG 24 hr tablet Take 1 tablet (25 mg total) by mouth 2 (two) times daily. 04/05/17  Yes Penny Pia, MD    Multiple Vitamins-Minerals (ONE-A-DAY 50 PLUS PO) Take 1 capsule by mouth daily.   Yes [provider]  ONGLYZA 2.5 MG TABS tablet TAKE 1 TABLET BY MOUTH DAILY Patient taking differently: TAKE 2.5MG  BY MOUTH DAILY 02/02/17  Yes Joaquim Nam, MD  pantoprazole (PROTONIX) 40 MG tablet TAKE 1 TABLET BY MOUTH DAILY Patient taking differently: TAKE 40MG  BY MOUTH DAILY 03/09/17  Yes Joaquim Nam, MD  senna-docusate (SENOKOT-S) 8.6-50 MG tablet  Take 1 tablet by mouth daily as needed for mild constipation. 07/28/15  Yes Barrett, Joline Salt, PA-C  traZODone (DESYREL) 50 MG tablet Take 0.5-1 tablets (25-50 mg total) by mouth at bedtime as needed for sleep. Don't take >50mg  in a day. 01/21/16  Yes Joaquim Nam, MD  Insulin Pen Needle (PEN NEEDLES) 30G X 5 MM MISC Inject 1 Dose as directed daily. Use with insulin pen Patient not taking: Reported on 04/28/2017 01/27/17   Joaquim Nam, MD  ONE TOUCH ULTRA TEST test strip USE TO CHECK BLOOD SUGAR ONCE A DAY AS DIRECTED Patient not taking: Reported on 04/28/2017 08/10/16   Joaquim Nam, MD    Family History Family History  Problem Relation Age of Onset  . Breast cancer Mother   . Cancer Mother   . Diabetes Daughter   . Hypertension Daughter   . Cancer Brother     Social History Social History  Substance Use Topics  . Smoking status: Never Smoker  . Smokeless tobacco: Never Used  . Alcohol use No     Allergies   Beta adrenergic blockers; Jardiance [empagliflozin]; and Spironolactone   Review of Systems Review of Systems  Reason unable to perform ROS: Altered mental status.  Genitourinary: Positive for dysuria.  Neurological: Positive for weakness.     Physical Exam Updated Vital Signs BP 118/63   Pulse 70   Temp 98.3 F (36.8 C) (Oral)   Resp 13   Ht 5\' 3"  (1.6 m)   Wt 88 kg (194 lb)   SpO2 98%   BMI 34.37 kg/m   Physical Exam  Constitutional: She appears well-developed and well-nourished. No distress.   HENT:  Head: Normocephalic and atraumatic.  Nose: Nose normal.  Eyes: Conjunctivae and EOM are normal. Right eye exhibits no discharge. Left eye exhibits no discharge. No scleral icterus.  Neck: Normal range of motion. Neck supple.  Cardiovascular: Normal rate, regular rhythm, normal heart sounds and intact distal pulses.  Exam reveals no gallop and no friction rub.   No murmur heard. Pulmonary/Chest: Effort normal and breath sounds normal. No respiratory distress.  Abdominal: Soft. Bowel sounds are normal. She exhibits no distension. There is no tenderness. There is no guarding.  Musculoskeletal: Normal range of motion. She exhibits no edema.  Neurological:  Patient appears drowsy. Decreased strength in bilateral upper and lower extremities. She is oriented to place but not person, time, current events. Easy to arouse but unable to reply to questions.  Skin: Skin is warm and dry. No rash noted.  Psychiatric: She has a normal mood and affect.  Nursing note and vitals reviewed.    ED Treatments / Results  Labs (all labs ordered are listed, but only abnormal results are displayed) Labs Reviewed  COMPREHENSIVE METABOLIC PANEL - Abnormal; Notable for the following:       Result Value   Glucose, Bld 171 (*)    BUN 36 (*)    Creatinine, Ser 2.09 (*)    Albumin 2.7 (*)    AST 82 (*)    Alkaline Phosphatase 138 (*)    GFR calc non Af Amer 21 (*)    GFR calc Af Amer 24 (*)    All other components within normal limits  CBC WITH DIFFERENTIAL/PLATELET - Abnormal; Notable for the following:    RBC 3.78 (*)    Hemoglobin 10.7 (*)    HCT 34.7 (*)    RDW 18.6 (*)    Platelets 81 (*)  All other components within normal limits  URINALYSIS, ROUTINE W REFLEX MICROSCOPIC - Abnormal; Notable for the following:    APPearance HAZY (*)    Hgb urine dipstick MODERATE (*)    Leukocytes, UA TRACE (*)    Bacteria, UA MANY (*)    Squamous Epithelial / LPF 0-5 (*)    All other components within  normal limits  I-STAT CG4 LACTIC ACID, ED - Abnormal; Notable for the following:    Lactic Acid, Venous 2.00 (*)    All other components within normal limits  URINE CULTURE    EKG  EKG Interpretation None       Radiology Ct Head Wo Contrast  Result Date: 04/28/2017 CLINICAL DATA:  83 year old female with a history of altered mental status EXAM: CT HEAD WITHOUT CONTRAST TECHNIQUE: Contiguous axial images were obtained from the base of the skull through the vertex without intravenous contrast. COMPARISON:  MRI 03/31/2017, CT 03/31/2017 FINDINGS: Brain: No acute intracranial hemorrhage. No midline shift or mass effect. Unremarkable configuration the ventricles. Gray-white differentiation maintained. Vascular: Atherosclerotic calcification of anterior and posterior circulation. Senescent calcifications of the basal ganglia Skull: No aggressive bony lesion. Sinuses/Orbits: No acute fracture. Other: None IMPRESSION: No CT evidence of acute intracranial abnormality. Electronically Signed   By: Gilmer Mor D.O.   On: 04/28/2017 16:35    Procedures Procedures (including critical care time)  Medications Ordered in ED Medications  cefTRIAXone (ROCEPHIN) 1 g in dextrose 5 % 50 mL IVPB (1 g Intravenous New Bag/Given 04/28/17 1713)     Initial Impression / Assessment and Plan / ED Course  I have reviewed the triage vital signs and the nursing notes.  Pertinent labs & imaging results that were available during my care of the patient were reviewed by me and considered in my medical decision making (see chart for details).     Patient presents to ED for evaluation of altered mental status that began earlier today. Family states that last known normal time was approximately 5 hours ago around 12 noon. They also state the patient has been having right-sided weakness and lethargy. She was complaining of dysuria yesterday. They state that she is on eliquis and has not had any falls, head injuries or  recent illness. She is afebrile with no history of fever. On physical exam patient is arousable but unable to answer questions. She is alert to place. Unable to perform neurological exam based on patient's mental status. CBC shows a low platelet count 81. CMP shows elevation in creatinine to 2.09. Lactic acid is 2. Urinalysis showed trace leukocytes and many bacteria. Patient given Rocephin here in the ED for UTI, CT of the head negative for acute intracranial abnormality. It appears that her altered mental status could be due to the UTI. Will admit patient for further evaluation and treatment. Spoke to hospitalist team who will admit patient. I appreciate their help with this patient.   Patient discussed with and seen by Dr. Denton Lank.  Final Clinical Impressions(s) / ED Diagnoses   Final diagnoses:  Altered mental status, unspecified altered mental status type    New Prescriptions New Prescriptions   No medications on file     Dietrich Pates, PA-C 04/28/17 1833    Cathren Laine, MD 04/29/17 2101

## 2017-04-28 NOTE — H&P (Signed)
History and Physical    Danielle Benitez ZOX:[id number] DOB: May 16, 1932 DOA: 04/28/2017  Referring MD/NP/PA: PA Hina  PCP: Joaquim Nam, MD    Patient coming from: home  Chief Complaint: altered mental status   HPI: Danielle Benitez is a 83 y.o. female with medical history significant for stroke (MRI brain in 03/2017 showed chronic basal ganglia and thalamus infarcts), atrial fibrillation on apixaban, dyslipidemia, diabetes, depression. Pt presented to ED and per family report she apparently was more confused since this am. She had some headache earlier in the morning but was alert and responding normally to family members. Then she started to be more altered and also had weakness in right arm. She apparently did not know where she was, seemed to be more sleepy. No other complaints such as respiratory distress. No falls. No loss of coconsciousness. No GI complaints. No fevers or chills or cough.   ED Course: In ED, pt was hemodynamically stable. Blood work was notable for hgb of 10.7, platelets 81, creatinine 2.09 (creatinine 3 weeks ago was 2.22), lactic acid 2.09. CT head showed no acute findings. UA showed trace leukocytes and she was started on IV rocephin.   Review of Systems:  Constitutional: Negative for fever, chills, diaphoresis, activity change, appetite change and fatigue.  HENT: Negative for ear pain, nosebleeds, congestion, facial swelling, rhinorrhea, neck pain, neck stiffness and ear discharge.   Eyes: Negative for pain, discharge, redness, itching and visual disturbance.  Respiratory: Negative for cough, choking, chest tightness, shortness of breath, wheezing and stridor.   Cardiovascular: Negative for chest pain, palpitations and leg swelling.  Gastrointestinal: Negative for abdominal distention.  Genitourinary: Negative for urgency, frequency, hematuria, flank pain, decreased urine volume, difficulty urinating and dyspareunia.  Musculoskeletal: Negative for back pain, joint  swelling, arthralgias and gait problem.  Neurological: per HPI Hematological: Negative for adenopathy. Does not bruise/bleed easily.  Psychiatric/Behavioral: Negative for hallucinations, behavioral problems, confusion, dysphoric mood, decreased concentration and agitation.   Past Medical History:  Diagnosis Date  . Abnormality of gait   . Anemia, unspecified   . Anticoagulated- Eliquis 07/31/2015  . Anxiety state, unspecified   . Aortic stenosis    a. Mild by echo 08/2013, not seen on repeat echo 02/2017.  Marland Kitchen Bifascicular block    afib  . Carotid artery disease (HCC)    a. Carotid dopplers (9/17) with 40-59% LICA stenosis (of note, prior ABI normal).  . Chronic diastolic CHF (congestive heart failure) (HCC)    a. Dx 07/2015 - EF 35-40%, unclear etiology ? r/t afib or viral etiology. b. EF normal in 02/2017.  . CKD (chronic kidney disease), stage III   . Depressive disorder, not elsewhere classified   . DM (diabetes mellitus), type 2, uncontrolled, with renal complications (HCC) 01/08/2012  . Eczema 11/22/2013  . Esophageal dysmotility 08/05/2015  . Essential hypertension   . Female stress incontinence 05/06/2015  . Full dentures   . GERD (gastroesophageal reflux disease)   . Gout   . HOH (hard of hearing)   . Internal hemorrhoids without mention of complication   . Iron deficiency   . Lumbago   . Memory loss   . Mild CAD    a. Cath 2013: 20-30% prox RCA.  Marland Kitchen Myocardial infarction (HCC) 07/2015  . Nonspecific abnormal results of liver function study   . Obesity, unspecified   . Orthostatic hypotension   . Osteoarthritis   . Other and unspecified hyperlipidemia   . PAF (paroxysmal atrial fibrillation) (HCC) 07/11/2015  a. prior DCCVs, amiodarone, anticoagulation.  . Pain in joint, shoulder region 11/22/2013   left   . Personal history of fall   . Presence of permanent cardiac pacemaker   . Pruritus 11/22/2013  . S/P cardiac pacemaker procedure 01/11/2012   a. for symptomatic  bradycardia. Followed by Dr. Ladona Ridgel. Device changed out 2017 due to ppm infection.  . Stroke [facility])    a. evidence of prior strokes on MRI in 2018.  . Wears glasses     Past Surgical History:  Procedure Laterality Date  . CARDIAC CATHETERIZATION  12/03/2003   Dr Clarene Duke  . CARDIOVERSION N/A 07/25/2015   Procedure: CARDIOVERSION;  Surgeon: Pricilla Riffle, MD;  Location: [facility] ENDOSCOPY;  Service: Cardiovascular;  Laterality: N/A;  . CARDIOVERSION N/A 08/06/2015   Procedure: CARDIOVERSION;  Surgeon: Laurey Morale, MD;  Location: [facility] ENDOSCOPY;  Service: Cardiovascular;  Laterality: N/A;  . CATARACT EXTRACTION W/ INTRAOCULAR LENS  IMPLANT, BILATERAL Bilateral 03/2015-04/2015  . COLONOSCOPY     ?????  . EP IMPLANTABLE DEVICE N/A 04/15/2016   Procedure: Pacemaker Implant;  Surgeon: Marinus Maw, MD;  Location[facility] INVASIVE CV LAB;  Service: Cardiovascular;  Laterality: N/A;  . ESOPHAGOGASTRODUODENOSCOPY N/A 08/08/2015   Procedure: ESOPHAGOGASTRODUODENOSCOPY (EGD);  Surgeon: Iva Boop, MD;  Location: [facility] ENDOSCOPY;  Service: Endoscopy;  Laterality: N/A;  . EXTREMITY CYST EXCISION     finger  . EYE SURGERY Bilateral    catartact  . INSERT / REPLACE / REMOVE PACEMAKER    . LEFT HEART CATHETERIZATION WITH CORONARY ANGIOGRAM N/A 01/10/2012   Procedure: LEFT HEART CATHETERIZATION WITH CORONARY ANGIOGRAM;  Surgeon: Chrystie Nose, MD;  Location: [facility] CATH LAB;  Service: Cardiovascular;  Laterality: N/A;  . ORIF WRIST FRACTURE Right 04/09/2014   Procedure: OPEN REDUCTION INTERNAL FIXATION (ORIF) RIGHT DISPLACED  DISTAL RADIUS  FRACTURE;  Surgeon: Dominica Severin, MD;  Location: [facility];  Service: Orthopedics;  Laterality: Right;  . PACEMAKER LEAD REMOVAL  04/12/2016   TEMPORARY PACEMAKER INSERTION  . PACEMAKER LEAD REMOVAL  04/15/2016   Procedure: Pacemaker Lead Removal;  Surgeon: Marinus Maw, MD;  Location: [facility] INVASIVE CV LAB;  Service: Cardiovascular;;  . PACEMAKER LEAD REMOVAL N/A  04/12/2016   Procedure: PACEMAKER LEAD REMOVAL;  Surgeon: Marinus Maw, MD;  Location: [facility] OR;  Service: Cardiovascular;  Laterality: N/A;  . PERMANENT PACEMAKER INSERTION Left 01/11/2012   Procedure: PERMANENT PACEMAKER INSERTION;  Surgeon: Thurmon Fair, MD;  Location: [facility] CATH LAB;  Service: Cardiovascular;  Laterality: Left;  . TEE WITHOUT CARDIOVERSION N/A 07/25/2015   Procedure: TRANSESOPHAGEAL ECHOCARDIOGRAM (TEE);  Surgeon: Pricilla Riffle, MD;  Location: [facility] ENDOSCOPY;  Service: Cardiovascular;  Laterality: N/A;    Social history:  reports that she has never smoked. She has never used smokeless tobacco. She reports that she does not drink alcohol or use drugs.  Ambulation: ambulates with walker at baseline  Allergies  Allergen Reactions  . Beta Adrenergic Blockers Other (See Comments)    Couldn't speak or hear Metoprolol seems ok  . Jardiance [Empagliflozin] Other (See Comments)    Rash, kidney issues  . Spironolactone     Hyperkalemia    Family History  Problem Relation Age of Onset  . Breast cancer Mother   . Cancer Mother   . Diabetes Daughter   . Hypertension Daughter   . Cancer Brother     Prior to Admission medications   Medication Sig Start Date End Date Taking? Authorizing Provider  albuterol (PROVENTIL HFA;VENTOLIN  HFA) 108 (90 Base) MCG/ACT inhaler Inhale 2 puffs into the lungs every 4 (four) hours as needed for wheezing or shortness of breath (cough, shortness of breath or wheezing.). 10/15/16  Yes Emi Belfast, FNP  allopurinol (ZYLOPRIM) 300 MG tablet TAKE 1 TABLET BY MOUTH DAILY Patient taking differently: TAKE 300MG  BY MOUTH DAILY 11/24/16  Yes Joaquim Nam, MD  amiodarone (PACERONE) 200 MG tablet TAKE 1 TABLET BY MOUTH DAILY Patient taking differently: TAKE 200MG  BY MOUTH DAILY 02/14/17  Yes Laurey Morale, MD  atorvastatin (LIPITOR) 20 MG tablet Take 1 tablet (20 mg total) by mouth daily at 6 PM. 04/06/17  Yes Penny Pia, MD  DULoxetine (CYMBALTA)  20 MG capsule TAKE ONE CAPSULE BY MOUTH EVERY NIGHT AT BEDTIME Patient taking differently: TAKE 20MG  BY MOUTH EVERY NIGHT AT BEDTIME 04/12/17  Yes Joaquim Nam, MD  ELIQUIS 2.5 MG TABS tablet TAKE 1 TABLET BY MOUTH TWICE A DAY Patient taking differently: TAKE 2.5MG  BY MOUTH TWICE A DAY 02/14/17  Yes Bensimhon, Bevelyn Buckles, MD  fluticasone (FLONASE) 50 MCG/ACT nasal spray Place 1 spray into both nostrils daily as needed for allergies or rhinitis.   Yes [provider]  furosemide (LASIX) 40 MG tablet Take 2 tablets (80 mg total) each morning and 1 tablet (40 mg total) each evening. Patient taking differently: Take 40 mg by mouth every evening.  04/11/17  Yes Joaquim Nam, MD  furosemide (LASIX) 80 MG tablet Take 80 mg by mouth daily with breakfast.   Yes [provider]  GAS RELIEF 80 MG chewable tablet CHEW ONE TABLET BY MOUTH EVERY 6 HOURS AS NEEDED FOR GAS. 11/06/16  Yes Joaquim Nam, MD  glimepiride (AMARYL) 1 MG tablet TAKE ONE TABLET BY MOUTH EVERY MORNING WITH BREAKFAST Patient taking differently: TAKE 1MG  BY MOUTH EVERY MORNING WITH BREAKFAST 04/07/17  Yes Joaquim Nam, MD  Insulin Glargine (LANTUS SOLOSTAR) 100 UNIT/ML Solostar Pen Inject 26 Units into the skin daily at 10 pm. Patient taking differently: Inject 25-27 Units into the skin See admin instructions. Daily at 10pm. Per sliding scale, based on the total units she got the night before and what blood sugar level currently is. If blood sugar 100-150 give same units as last time, greater than 151 increase by 1 unit. 04/11/17  Yes Joaquim Nam, MD  loratadine (CLARITIN CHILDRENS) 5 MG chewable tablet Chew 1 tablet (5 mg total) by mouth daily. 01/26/17  Yes Joaquim Nam, MD  metoprolol succinate (TOPROL-XL) 25 MG 24 hr tablet Take 1 tablet (25 mg total) by mouth 2 (two) times daily. 04/05/17  Yes Penny Pia, MD  Multiple Vitamins-Minerals (ONE-A-DAY 50 PLUS PO) Take 1 capsule by mouth daily.   Yes [provider]  ONGLYZA 2.5 MG TABS tablet TAKE 1 TABLET BY MOUTH DAILY Patient taking differently: TAKE 2.5MG  BY MOUTH DAILY 02/02/17  Yes Joaquim Nam, MD  pantoprazole (PROTONIX) 40 MG tablet TAKE 1 TABLET BY MOUTH DAILY Patient taking differently: TAKE 40MG  BY MOUTH DAILY 03/09/17  Yes Joaquim Nam, MD  senna-docusate (SENOKOT-S) 8.6-50 MG tablet Take 1 tablet by mouth daily as needed for mild constipation. 07/28/15  Yes Barrett, Joline Salt, PA-C  traZODone (DESYREL) 50 MG tablet Take 0.5-1 tablets (25-50 mg total) by mouth at bedtime as needed for sleep. Don't take >50mg  in a day. 01/21/16  Yes Joaquim Nam, MD  Insulin Pen Needle (PEN NEEDLES) 30G X 5 MM MISC Inject 1 Dose as  directed daily. Use with insulin pen Patient not taking: Reported on 04/28/2017 01/27/17   Joaquim Nam, MD  ONE TOUCH ULTRA TEST test strip USE TO CHECK BLOOD SUGAR ONCE A DAY AS DIRECTED Patient not taking: Reported on 04/28/2017 08/10/16   Joaquim Nam, MD    Physical Exam: Vitals:   04/28/17 1715 04/28/17 1730 04/28/17 1745 04/28/17 1800  BP: 139/71 117/69 119/69 120/69  Pulse: 69 70 70 69  Resp: 13 12 13 12   Temp:      TempSrc:      SpO2: 99% 98% 98% 98%  Weight:      Height:        Constitutional: NAD, calm, comfortable Vitals:   04/28/17 1715 04/28/17 1730 04/28/17 1745 04/28/17 1800  BP: 139/71 117/69 119/69 120/69  Pulse: 69 70 70 69  Resp: 13 12 13 12   Temp:      TempSrc:      SpO2: 99% 98% 98% 98%  Weight:      Height:       Eyes: PERRL, lids and conjunctivae normal ENMT: Mucous membranes are moist. Posterior pharynx clear of any exudate or lesions.Normal dentition.  Neck: normal, supple, no masses, no thyromegaly Respiratory: clear to auscultation bilaterally, no wheezing, no crackles. Normal respiratory effort. No accessory muscle use.  Cardiovascular: Regular rate and rhythm, no murmurs / rubs / gallops. No extremity edema. 2+ pedal pulses. No carotid bruits.  Abdomen:  no tenderness, no masses palpated. No hepatosplenomegaly. Bowel sounds positive.  Musculoskeletal: no clubbing / cyanosis. No joint deformity upper and lower extremities. Good ROM, no contractures. Normal muscle tone.  Skin: no rashes, lesions, ulcers. No induration Neurologic: CN 2-12 grossly intact. Sensation intact, right arm weaker compared with left arm Psychiatric: Normal judgment and insight. Alert and oriented x 3. Normal mood.    Labs on Admission: I have personally reviewed following labs and imaging studies  CBC:  Recent Labs Lab 04/28/17 1545  WBC 8.2  NEUTROABS 5.5  HGB 10.7*  HCT 34.7*  MCV 91.8  PLT 81*   Basic Metabolic Panel:  Recent Labs Lab 04/28/17 1545  NA 142  K 4.1  CL 105  CO2 27  GLUCOSE 171*  BUN 36*  CREATININE 2.09*  CALCIUM 8.9   GFR: Estimated Creatinine Clearance: 21.4 mL/min (A) (by C-G formula based on SCr of 2.09 mg/dL (H)). Liver Function Tests:  Recent Labs Lab 04/28/17 1545  AST 82*  ALT 32  ALKPHOS 138*  BILITOT 1.2  PROT 6.9  ALBUMIN 2.7*   No results for input(s): LIPASE, AMYLASE in the last 168 hours. No results for input(s): AMMONIA in the last 168 hours. Coagulation Profile: No results for input(s): INR, PROTIME in the last 168 hours. Cardiac Enzymes: No results for input(s): CKTOTAL, CKMB, CKMBINDEX, TROPONINI in the last 168 hours. BNP (last 3 results)  Recent Labs  09/20/16 1458 03/16/17 1441 04/11/17 1345  PROBNP 140.0* 287.0* 215.0*   HbA1C: No results for input(s): HGBA1C in the last 72 hours. CBG: No results for input(s): GLUCAP in the last 168 hours. Lipid Profile: No results for input(s): CHOL, HDL, LDLCALC, TRIG, CHOLHDL, LDLDIRECT in the last 72 hours. Thyroid Function Tests: No results for input(s): TSH, T4TOTAL, FREET4, T3FREE, THYROIDAB in the last 72 hours. Anemia Panel: No results for input(s): VITAMINB12, FOLATE, FERRITIN, TIBC, IRON, RETICCTPCT in the last 72 hours. Urine analysis:     Component Value Date/Time   COLORURINE YELLOW 04/28/2017 1600   APPEARANCEUR HAZY (A)  04/28/2017 1600   LABSPEC 1.008 04/28/2017 1600   PHURINE 7.0 04/28/2017 1600   GLUCOSEU NEGATIVE 04/28/2017 1600   HGBUR MODERATE (A) 04/28/2017 1600   BILIRUBINUR NEGATIVE 04/28/2017 1600   BILIRUBINUR 1+ 09/20/2016 1424   KETONESUR NEGATIVE 04/28/2017 1600   PROTEINUR NEGATIVE 04/28/2017 1600   UROBILINOGEN 0.2 09/20/2016 1424   UROBILINOGEN 0.2 08/02/2015 0330   NITRITE NEGATIVE 04/28/2017 1600   LEUKOCYTESUR TRACE (A) 04/28/2017 1600   Sepsis Labs: @LABRCNTIP (procalcitonin:4,lacticidven:4) )No results found for this or any previous visit (from the past 240 hour(s)).   Radiological Exams on Admission: Ct Head Wo Contrast  Result Date: 04/28/2017 CLINICAL DATA:  83 year old female with a history of altered mental status EXAM: CT HEAD WITHOUT CONTRAST TECHNIQUE: Contiguous axial images were obtained from the base of the skull through the vertex without intravenous contrast. COMPARISON:  MRI 03/31/2017, CT 03/31/2017 FINDINGS: Brain: No acute intracranial hemorrhage. No midline shift or mass effect. Unremarkable configuration the ventricles. Gray-white differentiation maintained. Vascular: Atherosclerotic calcification of anterior and posterior circulation. Senescent calcifications of the basal ganglia Skull: No aggressive bony lesion. Sinuses/Orbits: No acute fracture. Other: None IMPRESSION: No CT evidence of acute intracranial abnormality. Electronically Signed   By: Gilmer Mor D.O.   On: 04/28/2017 16:35    EKG: pending   Assessment/Plan  Principal Problem:   Acute metabolic encephalopathy - Rule out CVA - Has had recent MRI in 03/2017 which showed chronic basal ganglia and thalamus infarcts - Pt has MRI safe pacemaker - Stroke work up initiated:  - On apixaban - MRI brain - pending  - 2D ECHO - pending  - Carotid doppler - pending  - HgbA1c, Lipid panel - pending. LDL goal < 100.  Patient on lipitor - Diet: NPO until swallow evaluation completed  - Therapy: PT/OT Other Stroke Risk Factors : Advanced age, history of CVA   Active Problems:   Atrial fibrillation - 07/11/15 by PPM interrogation, recurrent 10/19 after DCCV 10/14 - CHADS vas score 7 - ON apixaban - Continue amiodarone     Acute lower UTI - Started Rocephin - Follow up urine cx results     Diabetes mellitus with diabetic nephropathy (HCC) - Resumed insulin dose per home regimen    Dyslipidemia associated with type 2 diabetes mellitus (HCC) - Continue Lipitor    Chronic diastolic CHF (congestive heart failure) (HCC) - Compensated - Pt is getting IV fluids started in ED - Resume lasix tomorrow    Depression - Continue Cymbalta     CKD stage 4 - Cr within baseline range     Thrombocytopenia - Likely from [facility] with apixaban - No bleeding    Anemia of chronic kidney disease - Hgb stable     DVT prophylaxis: Apixaban Code Status: full code Family Communication: daughter at the bedside  Disposition Plan: admission to telemetry  Consults called: PT  Disposition plan: Further plan will depend as patient's clinical course evolves and further radiologic and laboratory data become available.    At the time of admission, it appears that the appropriate admission status for this patient is INPATIENT .Thisis judged to be reasonable and necessary in order to provide the required intensity of service to ensure the patient's safetygiven the patient presentation of confusion in addition to physical exam findings, radiographic and laboratory data in the context of chronic comorbidities.   Manson Passey MD [facility] Pager [phone]  If 7PM-7AM, please contact night-coverage [email or web address] Password [facility]  04/28/2017, 6:40 PM

## 2017-04-28 NOTE — ED Triage Notes (Addendum)
Pt brought in by EMS due to having AMS. Per EMS, pt had weakness that started around 12. Pt has generalized weakness and lethargic at this time. Per pt, pt as been having burning when she urinates. Pt is a&ox2. Pt does take eliquis.

## 2017-04-28 NOTE — ED Notes (Signed)
Pt aware that urine sample is needed.  

## 2017-04-29 ENCOUNTER — Inpatient Hospital Stay (HOSPITAL_COMMUNITY): Payer: Medicare Other

## 2017-04-29 ENCOUNTER — Encounter (HOSPITAL_COMMUNITY): Payer: Self-pay | Admitting: General Practice

## 2017-04-29 DIAGNOSIS — I360 Nonrheumatic tricuspid (valve) stenosis: Secondary | ICD-10-CM

## 2017-04-29 DIAGNOSIS — R410 Disorientation, unspecified: Secondary | ICD-10-CM

## 2017-04-29 LAB — LIPID PANEL
CHOL/HDL RATIO: 2.8 ratio
Cholesterol: 99 mg/dL (ref 0–200)
HDL: 35 mg/dL — AB (ref >40)
LDL CALC: 47 mg/dL (ref 0–99)
TRIGLYCERIDES: 85 mg/dL (ref <150)
VLDL: 17 mg/dL (ref 0–40)

## 2017-04-29 LAB — CBC
HCT: 39.9 % (ref 36.0–46.0)
Hemoglobin: 12 g/dL (ref 12.0–15.0)
MCH: 27.8 pg (ref 26.0–34.0)
MCHC: 30.1 g/dL (ref 30.0–36.0)
MCV: 92.4 fL (ref 78.0–100.0)
PLATELETS: 87 10*3/uL — AB (ref 150–400)
RBC: 4.32 MIL/uL (ref 3.87–5.11)
RDW: 18.6 % — AB (ref 11.5–15.5)
WBC: 8.2 10*3/uL (ref 4.0–10.5)

## 2017-04-29 LAB — GLUCOSE, CAPILLARY
Glucose-Capillary: 68 mg/dL (ref 65–99)
Glucose-Capillary: 74 mg/dL (ref 65–99)
Glucose-Capillary: 223 mg/dL — ABNORMAL HIGH (ref 65–99)
Glucose-Capillary: 283 mg/dL — ABNORMAL HIGH (ref 65–99)
Glucose-Capillary: 369 mg/dL — ABNORMAL HIGH (ref 65–99)

## 2017-04-29 LAB — BASIC METABOLIC PANEL
Anion gap: 10 (ref 5–15)
BUN: 33 mg/dL — AB (ref 6–20)
CHLORIDE: 106 mmol/L (ref 101–111)
CO2: 27 mmol/L (ref 22–32)
CREATININE: 1.8 mg/dL — AB (ref 0.44–1.00)
Calcium: 9.2 mg/dL (ref 8.9–10.3)
GFR calc Af Amer: 29 mL/min — ABNORMAL LOW (ref >60)
GFR calc non Af Amer: 25 mL/min — ABNORMAL LOW (ref >60)
Glucose, Bld: 98 mg/dL (ref 65–99)
Potassium: 3.1 mmol/L — ABNORMAL LOW (ref 3.5–5.1)
SODIUM: 143 mmol/L (ref 135–145)

## 2017-04-29 LAB — ECHOCARDIOGRAM COMPLETE
HEIGHTINCHES: 63 in
WEIGHTICAEL: 3019.2 [oz_av]

## 2017-04-29 LAB — TSH: TSH: 2.022 u[IU]/mL (ref 0.350–4.500)

## 2017-04-29 MED ORDER — POTASSIUM CHLORIDE CRYS ER 20 MEQ PO TBCR
40.0000 meq | EXTENDED_RELEASE_TABLET | Freq: Once | ORAL | Status: AC
  Administered 2017-04-29: 40 meq via ORAL
  Filled 2017-04-29: qty 2

## 2017-04-29 MED ORDER — FUROSEMIDE 80 MG PO TABS
80.0000 mg | ORAL_TABLET | Freq: Every day | ORAL | Status: DC
  Administered 2017-04-29 – 2017-05-01 (×3): 80 mg via ORAL
  Filled 2017-04-29 (×3): qty 1

## 2017-04-29 NOTE — Progress Notes (Signed)
Pt arrived to 5w09. Transferred from stretcher to bed. Denied chest pain, SOB, no distress noted and VS stable.  Pt alert, but lethargic, oriented to herself only. Family at the bedside, educated about stroke.  Cardiac monitor placed on pt and pt identified properly.  Pt oriented to room and equipment, instructed to use call bell for help. Call bell left within reach.  Will continue to monitor pt.

## 2017-04-29 NOTE — Progress Notes (Signed)
OT Cancellation Note  Patient Details Name: Danielle Benitez MRN: [id number] DOB: 1932-03-31   Cancelled Treatment:    Reason Eval/Treat Not Completed: Patient at procedure or test/ unavailable (MRI). Will follow.  Evern Bio 04/29/2017, 1:19 PM  [phone]

## 2017-04-29 NOTE — Evaluation (Signed)
Speech Language Pathology Evaluation Patient Details Name: ROOSEVELT BISHER MRN: [id number] DOB: 16-Sep-1932 Today's Date: 04/29/2017 Time: 4098-1191 SLP Time Calculation (min) (ACUTE ONLY): 29 min  Problem List:  Patient Active Problem List   Diagnosis Date Noted  . UTI (urinary tract infection) 04/28/2017  . Acute metabolic encephalopathy 04/28/2017  . Acute lower UTI 04/28/2017  . Diabetes mellitus with diabetic nephropathy (HCC) 04/28/2017  . Dyslipidemia associated with type 2 diabetes mellitus (HCC) 04/28/2017  . Chronic diastolic CHF (congestive heart failure) (HCC) 04/28/2017  . Altered mental status   . TIA (transient ischemic attack) 03/31/2017  . Thrombocytopenia (HCC) 03/31/2017  . Chest pain 02/14/2017  . Dysuria 09/21/2016  . Fall 06/25/2016  . Skin tear of elbow without complication 06/24/2016  . Injury of left shoulder and upper arm 06/24/2016  . Unequal blood pressure in upper extremities 06/03/2016  . Cardiac pacemaker in situ 04/12/2016  . Cough 03/08/2016  . Risk for falls 03/05/2016  . Insomnia 01/21/2016  . Chronic systolic congestive heart failure, NYHA class 3 (HCC) 01/20/2016  . LFT elevation 11/21/2015  . Weakness 11/06/2015  . Dysphagia   . Esophageal dysmotility 08/05/2015  . Anticoagulated- Eliquis 07/31/2015  . Normochromic normocytic anemia 07/31/2015  . Atrial fibrillation - 07/11/15 by PPM interrogation, recurrent 10/19 after DCCV 10/14 07/18/2015  . Moderate mitral regurgitation 07/18/2015  . Moderate tricuspid regurgitation 07/18/2015  . History of pacemaker - Medtronic 07/18/2015  . CKD (chronic kidney disease), stage III   . Orthostatic hypotension   . Essential hypertension   . Pericardial effusion 07/15/2015  . Female stress incontinence 05/06/2015  . Knee pain 12/18/2014  . Low back pain 02/27/2014  . Obese 02/27/2014  . Depression 02/27/2014  . Pain in joint, shoulder region 11/22/2013  . Eczema 11/22/2013  . S/P cardiac pacemaker  procedure, PPM Medtronic REVO placed 01/11/2012 01/11/2012  . COPD bronchitis by CXR 01/10/2012  . Dyslipidemia 01/10/2012  . Mild CAD - 20-30% RCA in 2013 01/10/2012  . Fatigue 01/10/2012  . Sinus bradycardia, HR as low As 35 01/10/2012  . Mild aortic stenosis 01/10/2012  . Chest wall pain 01/08/2012  . SOB (shortness of breath) 01/08/2012  . DM, type 2, with renal complications  01/08/2012   Past Medical History:  Past Medical History:  Diagnosis Date  . Abnormality of gait   . Anemia, unspecified   . Anticoagulated- Eliquis 07/31/2015  . Anxiety state, unspecified   . Aortic stenosis    a. Mild by echo 08/2013, not seen on repeat echo 02/2017.  Marland Kitchen Bifascicular block    afib  . Carotid artery disease (HCC)    a. Carotid dopplers (9/17) with 40-59% LICA stenosis (of note, prior ABI normal).  . Chronic diastolic CHF (congestive heart failure) (HCC)    a. Dx 07/2015 - EF 35-40%, unclear etiology ? r/t afib or viral etiology. b. EF normal in 02/2017.  . CKD (chronic kidney disease), stage III   . Depressive disorder, not elsewhere classified   . DM (diabetes mellitus), type 2, uncontrolled, with renal complications (HCC) 01/08/2012  . Eczema 11/22/2013  . Esophageal dysmotility 08/05/2015  . Essential hypertension   . Female stress incontinence 05/06/2015  . Full dentures   . GERD (gastroesophageal reflux disease)   . Gout   . HOH (hard of hearing)   . Internal hemorrhoids without mention of complication   . Iron deficiency   . Lumbago   . Memory loss   . Mild CAD    a.  Cath 2013: 20-30% prox RCA.  Marland Kitchen Myocardial infarction (HCC) 07/2015  . Nonspecific abnormal results of liver function study   . Obesity, unspecified   . Orthostatic hypotension   . Osteoarthritis   . Other and unspecified hyperlipidemia   . PAF (paroxysmal atrial fibrillation) (HCC) 07/11/2015   a. prior DCCVs, amiodarone, anticoagulation.  . Pain in joint, shoulder region 11/22/2013   left   . Personal  history of fall   . Presence of permanent cardiac pacemaker   . Pruritus 11/22/2013  . S/P cardiac pacemaker procedure 01/11/2012   a. for symptomatic bradycardia. Followed by Dr. Ladona Ridgel. Device changed out 2017 due to ppm infection.  . Stroke [facility])    a. evidence of prior strokes on MRI in 2018.  Marland Kitchen UTI (urinary tract infection) 04/2017  . Wears glasses    Past Surgical History:  Past Surgical History:  Procedure Laterality Date  . CARDIAC CATHETERIZATION  12/03/2003   Dr Clarene Duke  . CARDIOVERSION N/A 07/25/2015   Procedure: CARDIOVERSION;  Surgeon: Pricilla Riffle, MD;  Location: [facility] ENDOSCOPY;  Service: Cardiovascular;  Laterality: N/A;  . CARDIOVERSION N/A 08/06/2015   Procedure: CARDIOVERSION;  Surgeon: Laurey Morale, MD;  Location: [facility] ENDOSCOPY;  Service: Cardiovascular;  Laterality: N/A;  . CATARACT EXTRACTION W/ INTRAOCULAR LENS  IMPLANT, BILATERAL Bilateral 03/2015-04/2015  . COLONOSCOPY     ?????  . EP IMPLANTABLE DEVICE N/A 04/15/2016   Procedure: Pacemaker Implant;  Surgeon: Marinus Maw, MD;  Location: [facility] INVASIVE CV LAB;  Service: Cardiovascular;  Laterality: N/A;  . ESOPHAGOGASTRODUODENOSCOPY N/A 08/08/2015   Procedure: ESOPHAGOGASTRODUODENOSCOPY (EGD);  Surgeon: Iva Boop, MD;  Location: [facility] ENDOSCOPY;  Service: Endoscopy;  Laterality: N/A;  . EXTREMITY CYST EXCISION     finger  . EYE SURGERY Bilateral    catartact  . INSERT / REPLACE / REMOVE PACEMAKER    . LEFT HEART CATHETERIZATION WITH CORONARY ANGIOGRAM N/A 01/10/2012   Procedure: LEFT HEART CATHETERIZATION WITH CORONARY ANGIOGRAM;  Surgeon: Chrystie Nose, MD;  Location: [facility] CATH LAB;  Service: Cardiovascular;  Laterality: N/A;  . ORIF WRIST FRACTURE Right 04/09/2014   Procedure: OPEN REDUCTION INTERNAL FIXATION (ORIF) RIGHT DISPLACED  DISTAL RADIUS  FRACTURE;  Surgeon: Dominica Severin, MD;  Location: [facility];  Service: Orthopedics;  Laterality: Right;  . PACEMAKER LEAD REMOVAL  04/12/2016   TEMPORARY  PACEMAKER INSERTION  . PACEMAKER LEAD REMOVAL  04/15/2016   Procedure: Pacemaker Lead Removal;  Surgeon: Marinus Maw, MD;  Location: [facility] INVASIVE CV LAB;  Service: Cardiovascular;;  . PACEMAKER LEAD REMOVAL N/A 04/12/2016   Procedure: PACEMAKER LEAD REMOVAL;  Surgeon: Marinus Maw, MD;  Location: [facility] OR;  Service: Cardiovascular;  Laterality: N/A;  . PERMANENT PACEMAKER INSERTION Left 01/11/2012   Procedure: PERMANENT PACEMAKER INSERTION;  Surgeon: Thurmon Fair, MD;  Location: [facility] CATH LAB;  Service: Cardiovascular;  Laterality: Left;  . TEE WITHOUT CARDIOVERSION N/A 07/25/2015   Procedure: TRANSESOPHAGEAL ECHOCARDIOGRAM (TEE);  Surgeon: Pricilla Riffle, MD;  Location: [facility] ENDOSCOPY;  Service: Cardiovascular;  Laterality: N/A;   HPI:  83 y.o. female with medical history significant for stroke (MRI brain in 03/2017 showed chronic basal ganglia and thalamus infarcts), atrial fibrillation on apixaban, dyslipidemia, diabetes, depression. Pt presented to ED and per family report she apparently was more confused since this am. She had some headache earlier in the morning but was alert and responding normally to family members. Then she started to be more altered and also had weakness in right  arm. She apparently did not know where she was, seemed to be more sleepy. No other complaints such as respiratory distress. No falls. No loss of coconsciousness. No GI complaints. No fevers or chills or cough.  MRI head on 04/29/17 indicated Chronic small vessel ischemia with no acute infarct identified. 2. No acute intracranial abnormality. Stable noncontrast MRI appearance of the brain since June. 3. Chronic cervical spine degeneration and spinal stenosis with some spinal cord mass effect.  Assessment / Plan / Recommendation Clinical Impression   Pt administered MOCA-2 [facility] Cognitive Assessment) with a typical score WFL being 26/30 and pt received 17/26 with portions requiring reading unable to be administered d/t  pt not having glasses available (although environmental signs WFL); memory deficits remain with delayed recall of 3/5 words, perseverations noted with word fluency during timed task and deficits in calculation/abstract thinking noted, but this may be related to limited education level (9th grade).  Attention is questionable as pt received no points for this section as the environment was noisy during these tasks with family present in the room/baseline memory deficits present.  ST will f/u x 1 for re-administration of visual tasks if pt able and to further address cognitive deficits prn re: attention; discussed progression of memory deficits with daughter/pt which may require [facility] SLP intervention for compensatory strategies for memory if family/pt want to pursue this at a later time.    SLP Assessment  SLP Recommendation/Assessment: Patient needs continued Speech Language Pathology Services SLP Visit Diagnosis: Cognitive communication deficit (R41.841)    Follow Up Recommendations  Home health SLP;Other (comment) (if needed)    Frequency and Duration min 1 x/week  1 week      SLP Evaluation Cognition  Overall Cognitive Status: History of cognitive impairments - at baseline Arousal/Alertness: Awake/alert Orientation Level: Oriented X4 Memory: Impaired Memory Impairment: Retrieval deficit;Decreased short term memory Decreased Short Term Memory: Verbal basic;Functional basic Awareness: Appears intact Problem Solving: Appears intact Behaviors: Perseveration Safety/Judgment: Appears intact       Comprehension  Auditory Comprehension Overall Auditory Comprehension: Appears within functional limits for tasks assessed Yes/No Questions: Within Functional Limits Commands: Within Functional Limits Conversation: Simple Visual Recognition/Discrimination Discrimination: Within Function Limits Reading Comprehension Reading Status: Unable to assess (comment) (environmental signs only; no glasses  available)    Expression Expression Primary Mode of Expression: Verbal Verbal Expression Overall Verbal Expression: Appears within functional limits for tasks assessed Initiation: No impairment Level of Generative/Spontaneous Verbalization: Conversation Repetition: No impairment Naming: No impairment Pragmatics: No impairment Non-Verbal Means of Communication: Not applicable Written Expression Dominant Hand: Right Written Expression: Not tested   Oral / Motor  Oral Motor/Sensory Function Overall Oral Motor/Sensory Function: Other (comment) (slight right weakness) Motor Speech Overall Motor Speech: Appears within functional limits for tasks assessed Respiration: Within functional limits Phonation: Normal Resonance: Within functional limits Articulation: Within functional limitis Intelligibility: Intelligible Motor Planning: Witnin functional limits Motor Speech Errors: Not applicable            Functional Assessment Tool Used: NOMS; clinical judgment Functional Limitations: Memory Memory Current Status (D4718): At least 40 percent but less than 60 percent impaired, limited or restricted Memory Goal Status (Z5015): At least 20 percent but less than 40 percent impaired, limited or restricted         Tressie Stalker, M.S., CCC-SLP 04/29/2017, 1:54 PM

## 2017-04-29 NOTE — Progress Notes (Signed)
*  PRELIMINARY RESULTS* Vascular Ultrasound Carotid Duplex (Doppler) has been completed.  Preliminary findings: Bilateral: No significant (1-39%) ICA stenosis. Antegrade vertebral flow.     Farrel Demark, RDMS, RVT  04/29/2017, 2:35 PM

## 2017-04-29 NOTE — Progress Notes (Signed)
PT Cancellation Note  Patient Details Name: Danielle Benitez MRN: [id number] DOB: 1932/08/15   Cancelled Treatment:    Reason Eval/Treat Not Completed: Patient at procedure or test/unavailable.  Pt has been in multiple procedures this afternoon.  Now in Echo.  Will try back today as able. 04/29/2017  [place] Bing, PT [phone]  (pager)   Eliseo Gum [name] 04/29/2017, 4:38 PM

## 2017-04-29 NOTE — Progress Notes (Signed)
Inpatient Diabetes Program Recommendations  AACE/ADA: New Consensus Statement on Inpatient Glycemic Control (2015)  Target Ranges:  Prepandial:   less than 140 mg/dL      Peak postprandial:   less than 180 mg/dL (1-2 hours)      Critically ill patients:  140 - 180 mg/dL   Lab Results  Component Value Date   GLUCAP 74 04/29/2017   HGBA1C 8.9 (H) 02/14/2017    Review of Glycemic Control Results for Danielle Benitez, Danielle Benitez (MRN [id number]) as of 04/29/2017 09:10  Ref. Range 04/28/2017 20:52 04/29/2017 08:17 04/29/2017 08:47  Glucose-Capillary Latest Ref Range: 65 - 99 mg/dL 892 (H) 68 74   Diabetes history: DM2 Outpatient Diabetes medications: Lantus 25-27 qd + Amaryl 1 mg + Onglyza Current orders for Inpatient glycemic control: Lantus 26 + Amaryl 1 mg + Tradjenta 5 + Novolog correction 0-9 units tid  Inpatient Diabetes Program Recommendations:  Fasting CBG 68. While in the hospital, Please consider: -Decrease Lantus by 50% -Hold Amaryl -Add Novolog meal coverage as needed Will follow.  Thank you, Billy Fischer. Eyleen Rawlinson, RN, MSN, CDE  Diabetes Coordinator Inpatient Glycemic Control Team Team Pager [phone] (8am-5pm) 04/29/2017 9:18 AM

## 2017-04-29 NOTE — Progress Notes (Signed)
  Echocardiogram 2D Echocardiogram has been performed.  Danielle Benitez 04/29/2017, 5:04 PM

## 2017-04-29 NOTE — Progress Notes (Addendum)
Patient ID: Danielle Benitez, female   DOB: [birth year], 83 y.o.   MRN: [id number]  PROGRESS NOTE    Danielle Benitez  OZH:[id number] DOB: August 02, 1932 DOA: 04/28/2017  PCP: Danielle Nam, MD   Brief Narrative:  83 y.o. female with medical history significant for stroke (MRI brain in 03/2017 showed chronic basal ganglia and thalamus infarcts), atrial fibrillation on apixaban, dyslipidemia, diabetes, depression. Pt presented to ED and per family report she apparently was more confused since the morning of the admission, less interactive.   In ED, pt was hemodynamically stable. Blood work was notable for hgb of 10.7, platelets 81, creatinine 2.09 (creatinine 3 weeks ago was 2.22), lactic acid 2.09. CT head showed no acute findings. UA showed trace leukocytes and she was started on IV rocephin.    Assessment & Plan:   Principal Problem:   Acute metabolic encephalopathy - Has had recent MRI in 03/2017 which showed chronic basal ganglia and thalamus infarcts - Pt has MRI safe pacemaker; has not yet had MRI so will follow up on the results - Continue apixaban  - ECHO is pending - Carotid doppler is pending  - A1c is pending - LDL 47, pt on lipitor - Therapy: PT/OT - pending  Other Stroke Risk Factors : Advanced age, history of CVA   Active Problems:   Atrial fibrillation - 07/11/15 by PPM interrogation, recurrent 10/19 after DCCV 10/14 - CHADS vas score 7 - Continue apixaban - Continue amiodarone     Acute lower UTI - Continue Rocephin - Urine cx pending     Diabetes mellitus with diabetic nephropathy (HCC) - Continue current insulin regimen     Dyslipidemia associated with type 2 diabetes mellitus (HCC) - Continue Lipitor     Chronic diastolic CHF (congestive heart failure) (HCC) - Compensated - Resume lasix this am    Depression - Continue Cymbalta     CKD stage 4 - Cr improving since admission  - Cr within baseline range     Thrombocytopenia - Likely from [facility] with  apixaban - No reports of bleeding  - Platelets 87 this am    Anemia of chronic kidney disease - Hgb stable    DVT prophylaxis: apixaban Code Status: full code  Family Communication: spoke with daughter at bedside  Disposition Plan: stroke work up in progress   Consultants:   PT  Procedures:   Carotid doppler - pending  ECHO - pending   Antimicrobials:   Rocephin 7/19 -->   Subjective: No overnight events.  Objective: Vitals:   04/29/17 0300 04/29/17 0504 04/29/17 0715 04/29/17 0833  BP: (!) 142/84 132/74 (!) 111/47 130/61  Pulse: 74 70 70 69  Resp: 18 16 17    Temp: 98.2 F (36.8 C) 98.3 F (36.8 C)    TempSrc: Oral Oral    SpO2: 98% 100% 95%   Weight:      Height:        Intake/Output Summary (Last 24 hours) at 04/29/17 0854 Last data filed at 04/29/17 0300  Gross per 24 hour  Intake            357.5 ml  Output                0 ml  Net            357.5 ml   Filed Weights   04/28/17 1458 04/28/17 2032  Weight: 88 kg (194 lb) 85.6 kg (188 lb 11.2 oz)    Examination:  General  exam: Appears calm and comfortable  Respiratory system: Clear to auscultation. Respiratory effort normal. Cardiovascular system: S1 & S2 heard, RRR. No JVD, murmurs, rubs, gallops or clicks. No pedal edema. Gastrointestinal system: Abdomen is nondistended, soft and nontender. No organomegaly or masses felt. Normal bowel sounds heard. Central nervous system: right arm weakness, alert  Extremities: Symmetric 5 x 5 power. Skin: No rashes, lesions or ulcers Psychiatry: Mood & affect appropriate.   Data Reviewed: I have personally reviewed following labs and imaging studies  CBC:  Recent Labs Lab 04/28/17 1545 04/29/17 0501  WBC 8.2 8.2  NEUTROABS 5.5  --   HGB 10.7* 12.0  HCT 34.7* 39.9  MCV 91.8 92.4  PLT 81* 87*   Basic Metabolic Panel:  Recent Labs Lab 04/28/17 1545 04/29/17 0501  NA 142 143  K 4.1 3.1*  CL 105 106  CO2 27 27  GLUCOSE 171* 98  BUN 36*  33*  CREATININE 2.09* 1.80*  CALCIUM 8.9 9.2   GFR: Estimated Creatinine Clearance: 24.6 mL/min (A) (by C-G formula based on SCr of 1.8 mg/dL (H)). Liver Function Tests:  Recent Labs Lab 04/28/17 1545  AST 82*  ALT 32  ALKPHOS 138*  BILITOT 1.2  PROT 6.9  ALBUMIN 2.7*   No results for input(s): LIPASE, AMYLASE in the last 168 hours. No results for input(s): AMMONIA in the last 168 hours. Coagulation Profile: No results for input(s): INR, PROTIME in the last 168 hours. Cardiac Enzymes: No results for input(s): CKTOTAL, CKMB, CKMBINDEX, TROPONINI in the last 168 hours. BNP (last 3 results)  Recent Labs  09/20/16 1458 03/16/17 1441 04/11/17 1345  PROBNP 140.0* 287.0* 215.0*   HbA1C: No results for input(s): HGBA1C in the last 72 hours. CBG:  Recent Labs Lab 04/28/17 2052 04/29/17 0817 04/29/17 0847  GLUCAP 226* 68 74   Lipid Profile:  Recent Labs  04/29/17 0501  CHOL 99  HDL 35*  LDLCALC 47  TRIG 85  CHOLHDL 2.8   Thyroid Function Tests:  Recent Labs  04/29/17 0501  TSH 2.022   Anemia Panel: No results for input(s): VITAMINB12, FOLATE, FERRITIN, TIBC, IRON, RETICCTPCT in the last 72 hours. Urine analysis:    Component Value Date/Time   COLORURINE YELLOW 04/28/2017 1600   APPEARANCEUR HAZY (A) 04/28/2017 1600   LABSPEC 1.008 04/28/2017 1600   PHURINE 7.0 04/28/2017 1600   GLUCOSEU NEGATIVE 04/28/2017 1600   HGBUR MODERATE (A) 04/28/2017 1600   BILIRUBINUR NEGATIVE 04/28/2017 1600   BILIRUBINUR 1+ 09/20/2016 1424   KETONESUR NEGATIVE 04/28/2017 1600   PROTEINUR NEGATIVE 04/28/2017 1600   UROBILINOGEN 0.2 09/20/2016 1424   UROBILINOGEN 0.2 08/02/2015 0330   NITRITE NEGATIVE 04/28/2017 1600   LEUKOCYTESUR TRACE (A) 04/28/2017 1600   Sepsis Labs: @LABRCNTIP (procalcitonin:4,lacticidven:4)   )No results found for this or any previous visit (from the past 240 hour(s)).    Radiology Studies: Ct Head Wo Contrast  Result Date:  04/28/2017 CLINICAL DATA:  83 year old female with a history of altered mental status EXAM: CT HEAD WITHOUT CONTRAST TECHNIQUE: Contiguous axial images were obtained from the base of the skull through the vertex without intravenous contrast. COMPARISON:  MRI 03/31/2017, CT 03/31/2017 FINDINGS: Brain: No acute intracranial hemorrhage. No midline shift or mass effect. Unremarkable configuration the ventricles. Gray-white differentiation maintained. Vascular: Atherosclerotic calcification of anterior and posterior circulation. Senescent calcifications of the basal ganglia Skull: No aggressive bony lesion. Sinuses/Orbits: No acute fracture. Other: None IMPRESSION: No CT evidence of acute intracranial abnormality. Electronically Signed   By: Marijean Niemann  Loreta Ave D.O.   On: 04/28/2017 16:35        Scheduled Meds: . allopurinol  300 mg Oral Daily  . amiodarone  200 mg Oral Daily  . apixaban  2.5 mg Oral BID  . atorvastatin  20 mg Oral q1800  . DULoxetine  20 mg Oral QHS  . furosemide  40 mg Oral QPM  . furosemide  80 mg Oral Daily  . glimepiride  1 mg Oral Q breakfast  . insulin aspart  0-9 Units Subcutaneous TID WC  . insulin glargine  26 Units Subcutaneous Q2200  . linagliptin  5 mg Oral Daily  . loratadine  5 mg Oral Daily  . metoprolol succinate  25 mg Oral BID  . pantoprazole  40 mg Oral Daily   Continuous Infusions: . sodium chloride 50 mL/hr at 04/28/17 2051  . cefTRIAXone (ROCEPHIN)  IV       LOS: 1 day    Time spent: 25 minutes  Greater than 50% of the time spent on counseling and coordinating the care.   Manson Passey, MD [facility] Pager [phone]  If 7PM-7AM, please contact night-coverage [email or web address] Password [facility] 04/29/2017, 8:54 AM

## 2017-04-30 LAB — BASIC METABOLIC PANEL
Anion gap: 10 (ref 5–15)
BUN: 30 mg/dL — AB (ref 6–20)
CALCIUM: 8.7 mg/dL — AB (ref 8.9–10.3)
CO2: 25 mmol/L (ref 22–32)
Chloride: 103 mmol/L (ref 101–111)
Creatinine, Ser: 1.91 mg/dL — ABNORMAL HIGH (ref 0.44–1.00)
GFR calc Af Amer: 27 mL/min — ABNORMAL LOW (ref >60)
GFR, EST NON AFRICAN AMERICAN: 23 mL/min — AB (ref >60)
GLUCOSE: 272 mg/dL — AB (ref 65–99)
Potassium: 3.7 mmol/L (ref 3.5–5.1)
Sodium: 138 mmol/L (ref 135–145)

## 2017-04-30 LAB — GLUCOSE, CAPILLARY
GLUCOSE-CAPILLARY: 277 mg/dL — AB (ref 65–99)
Glucose-Capillary: 130 mg/dL — ABNORMAL HIGH (ref 65–99)
Glucose-Capillary: 171 mg/dL — ABNORMAL HIGH (ref 65–99)
Glucose-Capillary: 284 mg/dL — ABNORMAL HIGH (ref 65–99)

## 2017-04-30 LAB — CBC
HCT: 34.6 % — ABNORMAL LOW (ref 36.0–46.0)
HEMOGLOBIN: 10.4 g/dL — AB (ref 12.0–15.0)
MCH: 27.7 pg (ref 26.0–34.0)
MCHC: 30.1 g/dL (ref 30.0–36.0)
MCV: 92.3 fL (ref 78.0–100.0)
PLATELETS: 99 10*3/uL — AB (ref 150–400)
RBC: 3.75 MIL/uL — AB (ref 3.87–5.11)
RDW: 18.5 % — AB (ref 11.5–15.5)
WBC: 9.7 10*3/uL (ref 4.0–10.5)

## 2017-04-30 LAB — HEMOGLOBIN A1C
Hgb A1c MFr Bld: 8.3 % — ABNORMAL HIGH (ref 4.8–5.6)
Mean Plasma Glucose: 192 mg/dL

## 2017-04-30 MED ORDER — LORAZEPAM 2 MG/ML IJ SOLN
0.5000 mg | Freq: Once | INTRAMUSCULAR | Status: AC
  Administered 2017-04-30: 0.5 mg via INTRAVENOUS
  Filled 2017-04-30: qty 1

## 2017-04-30 NOTE — Progress Notes (Addendum)
Patient ID: Danielle Benitez, female   DOB: 05/19/1932, 83 y.o.   MRN: [id number]  PROGRESS NOTE    Danielle Benitez  KAJ:[id number] DOB: May 30, 1932 DOA: 04/28/2017  PCP: Joaquim Nam, MD   Brief Narrative:  83 y.o. female with medical history significant for stroke (MRI brain in 03/2017 showed chronic basal ganglia and thalamus infarcts), atrial fibrillation on apixaban, dyslipidemia, diabetes, depression. Pt presented to ED and per family report she apparently was more confused since the morning of the admission, less interactive.   In ED, pt was hemodynamically stable. Blood work was notable for hgb of 10.7, platelets 81, creatinine 2.09 (creatinine 3 weeks ago was 2.22), lactic acid 2.09. CT head showed no acute findings. UA showed trace leukocytes and she was started on IV rocephin.    Assessment & Plan:   Principal Problem:   Acute metabolic encephalopathy - Has had recent MRI in 03/2017 which showed chronic basal ganglia and thalamus infarcts - MRI brain - no acute stroke  - Continue apixaban - ECHO pending  - Carotid doppler pending  - A1c 8.3 - LDL 47, pt on Lipitor - PT eval pending  Other Stroke Risk Factors : Advanced age, history of CVA   Active Problems:   Atrial fibrillation - 07/11/15 by PPM interrogation, recurrent 10/19 after DCCV 10/14 - CHADS vas score 7 - Continue apixaban  - Continue amiodarone     Acute lower UTI - Continue Rocephin - Final urine cx pending     Diabetes mellitus with diabetic nephropathy (HCC) - Continue insulin regimen     Dyslipidemia associated with type 2 diabetes mellitus (HCC) - Continue Lipitor     Chronic diastolic CHF (congestive heart failure) (HCC) - Compensated - Continue lasix per home regimen     Depression - Continue Cymbalta      CKD stage 4 - Cr improving since admission  - Cr within baseline range     Thrombocytopenia - Likely from [facility] with apixaban -Platelets stable     Anemia of chronic kidney  disease - Hgb stable    DVT prophylaxis: on apixaban  Code Status: full code  Family Communication: left VM on pt daughter VM Disposition Plan: stroke work up in progress   Consultants:   PT  Procedures:   Carotid doppler - pending  ECHO - pending   Antimicrobials:   Rocephin 7/19 -->   Subjective: No overnight events.   Objective: Vitals:   04/29/17 0833 04/29/17 1309 04/29/17 2130 04/30/17 0642  BP: 130/61 (!) 112/52 (!) 154/75 (!) 130/50  Pulse: 69 69 72 70  Resp:  17 18 18   Temp:  97.7 F (36.5 C) 98.1 F (36.7 C) 98.2 F (36.8 C)  TempSrc:  Oral Oral Oral  SpO2:  98% 99% 98%  Weight:    86.2 kg (190 lb)  Height:        Intake/Output Summary (Last 24 hours) at 04/30/17 1312 Last data filed at 04/30/17 0500  Gross per 24 hour  Intake          1053.33 ml  Output             1450 ml  Net          -396.67 ml   Filed Weights   04/28/17 1458 04/28/17 2032 04/30/17 0642  Weight: 88 kg (194 lb) 85.6 kg (188 lb 11.2 oz) 86.2 kg (190 lb)    Examination:  General exam: No distress  Respiratory system: no wheezing, no  rhonchi  Cardiovascular system: S1 & S2 heard, Rate controlled  Gastrointestinal system: non tender, (+) BS Central nervous system: not restless or agitated  Extremities: no swelling, no tenderness Skin: warm, dry  Psychiatry: normal mood and behavior   Data Reviewed: I have personally reviewed following labs and imaging studies  CBC:  Recent Labs Lab 04/28/17 1545 04/29/17 0501 04/30/17 0254  WBC 8.2 8.2 9.7  NEUTROABS 5.5  --   --   HGB 10.7* 12.0 10.4*  HCT 34.7* 39.9 34.6*  MCV 91.8 92.4 92.3  PLT 81* 87* 99*   Basic Metabolic Panel:  Recent Labs Lab 04/28/17 1545 04/29/17 0501 04/30/17 0254  NA 142 143 138  K 4.1 3.1* 3.7  CL 105 106 103  CO2 27 27 25   GLUCOSE 171* 98 272*  BUN 36* 33* 30*  CREATININE 2.09* 1.80* 1.91*  CALCIUM 8.9 9.2 8.7*   GFR: Estimated Creatinine Clearance: 23.2 mL/min (A) (by C-G  formula based on SCr of 1.91 mg/dL (H)). Liver Function Tests:  Recent Labs Lab 04/28/17 1545  AST 82*  ALT 32  ALKPHOS 138*  BILITOT 1.2  PROT 6.9  ALBUMIN 2.7*   No results for input(s): LIPASE, AMYLASE in the last 168 hours. No results for input(s): AMMONIA in the last 168 hours. Coagulation Profile: No results for input(s): INR, PROTIME in the last 168 hours. Cardiac Enzymes: No results for input(s): CKTOTAL, CKMB, CKMBINDEX, TROPONINI in the last 168 hours. BNP (last 3 results)  Recent Labs  09/20/16 1458 03/16/17 1441 04/11/17 1345  PROBNP 140.0* 287.0* 215.0*   HbA1C:  Recent Labs  04/29/17 0501  HGBA1C 8.3*   CBG:  Recent Labs Lab 04/29/17 1319 04/29/17 1653 04/29/17 2144 04/30/17 0830 04/30/17 1219  GLUCAP 223* 283* 369* 130* 171*   Lipid Profile:  Recent Labs  04/29/17 0501  CHOL 99  HDL 35*  LDLCALC 47  TRIG 85  CHOLHDL 2.8   Thyroid Function Tests:  Recent Labs  04/29/17 0501  TSH 2.022   Anemia Panel: No results for input(s): VITAMINB12, FOLATE, FERRITIN, TIBC, IRON, RETICCTPCT in the last 72 hours. Urine analysis:    Component Value Date/Time   COLORURINE YELLOW 04/28/2017 1600   APPEARANCEUR HAZY (A) 04/28/2017 1600   LABSPEC 1.008 04/28/2017 1600   PHURINE 7.0 04/28/2017 1600   GLUCOSEU NEGATIVE 04/28/2017 1600   HGBUR MODERATE (A) 04/28/2017 1600   BILIRUBINUR NEGATIVE 04/28/2017 1600   BILIRUBINUR 1+ 09/20/2016 1424   KETONESUR NEGATIVE 04/28/2017 1600   PROTEINUR NEGATIVE 04/28/2017 1600   UROBILINOGEN 0.2 09/20/2016 1424   UROBILINOGEN 0.2 08/02/2015 0330   NITRITE NEGATIVE 04/28/2017 1600   LEUKOCYTESUR TRACE (A) 04/28/2017 1600   Sepsis Labs: @LABRCNTIP (procalcitonin:4,lacticidven:4)   ) Recent Results (from the past 240 hour(s))  Urine Culture     Status: Abnormal (Preliminary result)   Collection Time: 04/28/17  4:07 PM  Result Value Ref Range Status   Specimen Description URINE, RANDOM  Final    Special Requests NONE  Final   Culture >=100,000 COLONIES/mL ESCHERICHIA COLI (A)  Final   Report Status PENDING  Incomplete      Radiology Studies: Ct Head Wo Contrast  Result Date: 04/28/2017 CLINICAL DATA:  83 year old female with a history of altered mental status EXAM: CT HEAD WITHOUT CONTRAST TECHNIQUE: Contiguous axial images were obtained from the base of the skull through the vertex without intravenous contrast. COMPARISON:  MRI 03/31/2017, CT 03/31/2017 FINDINGS: Brain: No acute intracranial hemorrhage. No midline shift or mass effect.  Unremarkable configuration the ventricles. Gray-white differentiation maintained. Vascular: Atherosclerotic calcification of anterior and posterior circulation. Senescent calcifications of the basal ganglia Skull: No aggressive bony lesion. Sinuses/Orbits: No acute fracture. Other: None IMPRESSION: No CT evidence of acute intracranial abnormality. Electronically Signed   By: Gilmer Mor D.O.   On: 04/28/2017 16:35   Mr Brain Wo Contrast  Result Date: 04/29/2017 CLINICAL DATA:  83 year old female with altered mental status. Chronic small vessel infarcts on June 2018 brain MRI. Atrial fibrillation. Confusion, headache. Worsening mental status. EXAM: MRI HEAD WITHOUT CONTRAST TECHNIQUE: Multiplanar, multiecho pulse sequences of the brain and surrounding structures were obtained without intravenous contrast. COMPARISON:  Head CT 04/28/2017.  Brain MRI 03/31/2017. FINDINGS: Brain: Chronic lacunar infarcts in the bilateral thalami. Occasional tiny lacune in the cerebellum. Chronic T2 heterogeneity in the bilateral basal ganglia and cerebral white matter. No cortical encephalomalacia or chronic cerebral blood products identified. No convincing restricted diffusion to suggest acute infarction. No midline shift, mass effect, evidence of mass lesion, ventriculomegaly, extra-axial collection or acute intracranial hemorrhage. Cervicomedullary junction and pituitary are  within normal limits. Vascular: Major intracranial vascular flow voids are stable. Skull and upper cervical spine: Chronic cervical spine degeneration, including degenerative spinal stenosis with spinal cord mass effect at C4-C5 and C5-C6 (series 6, images 12 and 13). Visualized bone marrow signal is within normal limits. Sinuses/Orbits: Stable an negative. Other: Visible internal auditory structures appear stable and within normal limits. Mastoids are clear. Visible scalp and face soft tissues appear normal. IMPRESSION: 1. Chronic small vessel ischemia with no acute infarct identified. 2. No acute intracranial abnormality. Stable noncontrast MRI appearance of the brain since June. 3. Chronic cervical spine degeneration and spinal stenosis with some spinal cord mass effect. Electronically Signed   By: Odessa Fleming M.D.   On: 04/29/2017 13:07        Scheduled Meds: . allopurinol  300 mg Oral Daily  . amiodarone  200 mg Oral Daily  . apixaban  2.5 mg Oral BID  . atorvastatin  20 mg Oral q1800  . DULoxetine  20 mg Oral QHS  . furosemide  40 mg Oral QPM  . furosemide  80 mg Oral Daily  . glimepiride  1 mg Oral Q breakfast  . insulin aspart  0-9 Units Subcutaneous TID WC  . insulin glargine  26 Units Subcutaneous Q2200  . linagliptin  5 mg Oral Daily  . loratadine  5 mg Oral Daily  . metoprolol succinate  25 mg Oral BID  . pantoprazole  40 mg Oral Daily   Continuous Infusions: . sodium chloride 50 mL/hr at 04/29/17 1751  . cefTRIAXone (ROCEPHIN)  IV Stopped (04/29/17 1821)     LOS: 2 days    Time spent: 25 minutes  Greater than 50% of the time spent on counseling and coordinating the care.   Manson Passey, MD [facility] Pager [phone]  If 7PM-7AM, please contact night-coverage [email or web address] Password [email or web address] 04/30/2017, 1:12 PM

## 2017-04-30 NOTE — Evaluation (Signed)
Physical Therapy Evaluation Patient Details Name: Danielle Benitez MRN: [id number] DOB: [birth year] Today's Date: 04/30/2017   History of Present Illness  83 yo female admitted with R side weakness and AMS, (-) MRI for new stroke since having June 2018 event.  Acute metabolic encephalopathy, was originally not taken into CIR then.  PMHx:  CHF, HTN, anemia, bradycardia, pacer, a-fib, CKD 3, HOH, DM, gout,   Clinical Impression  Pt is up to stand with PT and noted her inability to step to R side due to strength changes with BLE's.  Talked with pt and daughter about her struggles to stand and to control balance, inability to take a step and recommendation for inpt therapy to see her.  Pt has difficulty with grip on both arms to use walker so assisted her with gait belt to chair with pt demonstrating poor follow through.  Very weak and encouraged her to sit up to chair, asked nsg to use a standing lift or 2 person assist back to bed for safety of staff and pt.      Follow Up Recommendations CIR    Equipment Recommendations  None recommended by PT    Recommendations for Other Services Rehab consult     Precautions / Restrictions Precautions Precautions: Fall;ICD/Pacemaker Restrictions Weight Bearing Restrictions: No      Mobility  Bed Mobility Overal bed mobility: Needs Assistance Bed Mobility: Supine to Sit     Supine to sit: Min assist     General bed mobility comments: requires help with trunk and pivoted on bed pad to side of bed  Transfers Overall transfer level: Needs assistance Equipment used: 1 person hand held assist Transfers: Sit to/from Stand;Stand Pivot Transfers Sit to Stand: Min assist Stand pivot transfers: Min assist       General transfer comment: pt impulsive and standing up before OT could donn gait belt  Ambulation/Gait             General Gait Details: unable  Stairs            Wheelchair Mobility    Modified Rankin (Stroke Patients  Only) Modified Rankin (Stroke Patients Only) Pre-Morbid Rankin Score: Moderate disability Modified Rankin: Moderately severe disability     Balance Overall balance assessment: Needs assistance Sitting-balance support: Feet supported;Single extremity supported Sitting balance-Leahy Scale: Fair Sitting balance - Comments: pt is unsafe to leave unattended bedside Postural control: Posterior lean Standing balance support: Bilateral upper extremity supported;During functional activity Standing balance-Leahy Scale: Poor                               Pertinent Vitals/Pain Pain Assessment: 0-10    Home Living Family/patient expects to be discharged to:: Private residence Living Arrangements: Children Available Help at Discharge: Family;Friend(s);Available 24 hours/day Type of Home: Apartment Home Access: Level entry     Home Layout: One level Home Equipment: Walker - 2 wheels;Cane - quad;Walker - 4 wheels;Shower seat      Prior Function Level of Independence: Needs assistance   Gait / Transfers Assistance Needed: RW for ambulation   ADL's / Homemaking Assistance Needed: assist with bathing  Comments: Daughter works from home and provides 24/7 supervision.     Hand Dominance   Dominant Hand: Right    Extremity/Trunk Assessment   Upper Extremity Assessment Upper Extremity Assessment: Generalized weakness RUE Deficits / Details: weakness to hold onto walker with hand    Lower Extremity Assessment Lower Extremity  Assessment: RLE deficits/detail RLE Deficits / Details: generalized weakness on RLE RLE Coordination: decreased gross motor    Cervical / Trunk Assessment Cervical / Trunk Assessment: Kyphotic  Communication   Communication: No difficulties  Cognition Arousal/Alertness: Awake/alert Behavior During Therapy: Impulsive Overall Cognitive Status: History of cognitive impairments - at baseline                                 General  Comments: has been home with daughter who works from home      General Comments      Exercises General Exercises - Lower Extremity Ankle Circles/Pumps: AAROM;Both;10 reps Straight Leg Raises: AAROM;Right;5 reps   Assessment/Plan    PT Assessment Patient needs continued PT services  PT Problem List Decreased strength;Decreased range of motion;Decreased activity tolerance;Decreased balance;Decreased mobility;Decreased coordination;Decreased cognition;Decreased knowledge of use of DME;Decreased safety awareness;Obesity;Decreased skin integrity       PT Treatment Interventions DME instruction;Gait training;Functional mobility training;Therapeutic activities;Therapeutic exercise;Balance training;Neuromuscular re-education;Patient/family education    PT Goals (Current goals can be found in the Care Plan section)  Acute Rehab PT Goals Patient Stated Goal: to get home PT Goal Formulation: With patient/family Time For Goal Achievement: 05/14/17 Potential to Achieve Goals: Good    Frequency Min 3X/week   Barriers to discharge Other (comment) pt is requiring more help with care and will need family to provide hands on assist for al mobility and self care    Co-evaluation               AM-PAC PT "6 Clicks" Daily Activity  Outcome Measure Difficulty turning over in bed (including adjusting bedclothes, sheets and blankets)?: Total Difficulty moving from lying on back to sitting on the side of the bed? : Total Difficulty sitting down on and standing up from a chair with arms (e.g., wheelchair, bedside commode, etc,.)?: Total Help needed moving to and from a bed to chair (including a wheelchair)?: A Lot Help needed walking in hospital room?: Total Help needed climbing 3-5 steps with a railing? : Total 6 Click Score: 7    End of Session Equipment Utilized During Treatment: Gait belt Activity Tolerance: Patient limited by fatigue;Treatment limited secondary to medical complications  (Comment);Other (comment) (R side weakness to step to chair) Patient left: in chair;with call bell/phone within reach;with chair alarm set;with family/visitor present;with nursing/sitter in room Nurse Communication: Mobility status PT Visit Diagnosis: Unsteadiness on feet (R26.81);Muscle weakness (generalized) (M62.81);Difficulty in walking, not elsewhere classified (R26.2)    Time: 1096-0454 PT Time Calculation (min) (ACUTE ONLY): 33 min   Charges:   PT Evaluation $PT Eval Moderate Complexity: 1 Procedure PT Treatments $Therapeutic Activity: 8-22 mins   PT G Codes:   PT G-Codes **NOT FOR INPATIENT CLASS** Functional Assessment Tool Used: AM-PAC 6 Clicks Basic Mobility    Ivar Drape 04/30/2017, 1:42 PM   Samul Dada, PT MS Acute Rehab Dept. Number: [facility] [id number] and [facility] [phone]

## 2017-04-30 NOTE — Progress Notes (Signed)
Rehab Admissions Coordinator Note:  Patient was screened by Trish Mage for appropriateness for an Inpatient Acute Rehab Consult.  At this time, we are recommending Inpatient Rehab consult.  Trish Mage 04/30/2017, 3:57 PM  I can be reached at [phone].

## 2017-04-30 NOTE — Evaluation (Signed)
Occupational Therapy Evaluation Patient Details Name: Danielle Benitez MRN: [id number] DOB: 1931-11-16 Today's Date: 04/30/2017    History of Present Illness 83 yo female admitted with R side weakness and AMS, (-) MRI for new stroke since having June 2018 event.  Acute metabolic encephalopathy, was originally not taken into CIR then.  PMHx:  CHF, HTN, anemia, bradycardia, pacer, a-fib, CKD 3, HOH, DM, gout,    Clinical Impression   Pt with decline in function and safety with ADLs and ADL mobility with decreased strength, balance, endurance and cognition. Pt with decreased safety awareness, impulsive during mobility. Pt and family prefer to d/c home with [facility] therapies, however recommending more intensive therapy at this time to maximize level of function and safety before returning home. OT answered many questions from patient's daughter about level of care required and amount of therapy given at different  therapy settings. Acute OT will follow    Follow Up Recommendations  CIR    Equipment Recommendations  Other (comment) (TBD at next venue of care)    Recommendations for Other Services Rehab consult     Precautions / Restrictions Precautions Precautions: Fall;ICD/Pacemaker Restrictions Weight Bearing Restrictions: No      Mobility Bed Mobility Overal bed mobility: Needs Assistance Bed Mobility: Supine to Sit     Supine to sit: Min assist     General bed mobility comments: requires help with trunk and pivoted on bed pad to side of bed  Transfers Overall transfer level: Needs assistance Equipment used: 1 person hand held assist Transfers: Sit to/from Stand;Stand Pivot Transfers Sit to Stand: Min assist Stand pivot transfers: Min assist       General transfer comment: pt impulsive and standing up before OT could donn gait belt    Balance Overall balance assessment: Needs assistance Sitting-balance support: Feet supported;Single extremity supported Sitting balance-Leahy  Scale: Fair Sitting balance - Comments: pt is unsafe to leave unattended bedside Postural control: Posterior lean Standing balance support: Bilateral upper extremity supported;During functional activity Standing balance-Leahy Scale: Poor                             ADL either performed or assessed with clinical judgement   ADL Overall ADL's : Needs assistance/impaired     Grooming: Wash/dry hands;Wash/dry face;Sitting;Set up   Upper Body Bathing: Min guard;Sitting   Lower Body Bathing: Moderate assistance   Upper Body Dressing : Min guard;Sitting   Lower Body Dressing: Maximal assistance   Toilet Transfer: Minimal assistance;BSC;Stand-pivot   Toileting- Clothing Manipulation and Hygiene: Moderate assistance;Sit to/from stand       Functional mobility during ADLs: Minimal assistance       Vision Baseline Vision/History: Wears glasses Wears Glasses: Reading only Patient Visual Report: No change from baseline                  Pertinent Vitals/Pain Pain Assessment: 0-10     Hand Dominance Right   Extremity/Trunk Assessment Upper Extremity Assessment Upper Extremity Assessment: Generalized weakness RUE Deficits / Details: weakness to hold onto walker with hand   Lower Extremity Assessment Lower Extremity Assessment: RLE deficits/detail RLE Deficits / Details: generalized weakness on RLE RLE Coordination: decreased gross motor   Cervical / Trunk Assessment Cervical / Trunk Assessment: Kyphotic   Communication Communication Communication: No difficulties   Cognition Arousal/Alertness: Awake/alert Behavior During Therapy: Impulsive Overall Cognitive Status: History of cognitive impairments - at baseline  General Comments: has been home with daughter who works from home   General Comments       Exercises Exercises: General Lower Extremity General Exercises - Lower Extremity Ankle Circles/Pumps:  AAROM;Both;10 reps Straight Leg Raises: AAROM;Right;5 reps   Shoulder Instructions      Home Living Family/patient expects to be discharged to:: Private residence Living Arrangements: Children Available Help at Discharge: Family;Friend(s);Available 24 hours/day Type of Home: Apartment Home Access: Level entry     Home Layout: One level     Bathroom Shower/Tub: Tub/shower unit;Curtain   Bathroom Toilet: Standard Bathroom Accessibility: No   Home Equipment: Environmental consultant - 2 wheels;Cane - quad;Walker - 4 wheels;Shower seat      Lives With: Daughter    Prior Functioning/Environment Level of Independence: Needs assistance  Gait / Transfers Assistance Needed: RW for ambulation  ADL's / Homemaking Assistance Needed: assist with bathing   Comments: Daughter works from home and provides 24/7 supervision.        OT Problem List: Decreased strength;Impaired balance (sitting and/or standing);Decreased cognition;Decreased knowledge of precautions;Decreased safety awareness;Decreased activity tolerance;Decreased knowledge of use of DME or AE      OT Treatment/Interventions: Self-care/ADL training;DME and/or AE instruction;Therapeutic activities;Balance training;Therapeutic exercise;Neuromuscular education;Patient/family education    OT Goals(Current goals can be found in the care plan section) Acute Rehab OT Goals Patient Stated Goal: go home OT Goal Formulation: With patient/family Time For Goal Achievement: 05/07/17 Potential to Achieve Goals: Good ADL Goals Pt Will Perform Grooming: with min guard assist;standing Pt Will Perform Upper Body Bathing: with supervision;with set-up;sitting Pt Will Perform Lower Body Bathing: with min assist;sitting/lateral leans;sit to/from stand Pt Will Perform Upper Body Dressing: with supervision;with set-up;standing Pt Will Transfer to Toilet: with min guard assist;ambulating;bedside commode;grab bars;regular height toilet  OT Frequency: Min  2X/week   Barriers to D/C: Decreased caregiver support                        AM-PAC PT "6 Clicks" Daily Activity     Outcome Measure Help from another person eating meals?: None Help from another person taking care of personal grooming?: A Little Help from another person toileting, which includes using toliet, bedpan, or urinal?: A Lot Help from another person bathing (including washing, rinsing, drying)?: A Lot Help from another person to put on and taking off regular upper body clothing?: A Little Help from another person to put on and taking off regular lower body clothing?: A Lot 6 Click Score: 16   End of Session Equipment Utilized During Treatment: Other (comment) (BSC)  Activity Tolerance: Patient tolerated treatment well Patient left: in chair;with call bell/phone within reach;with chair alarm set;with family/visitor present  OT Visit Diagnosis: Unsteadiness on feet (R26.81);Muscle weakness (generalized) (M62.81);Other symptoms and signs involving cognitive function;Other abnormalities of gait and mobility (R26.89)                Time: 3343-5686 OT Time Calculation (min): 27 min Charges:  OT General Charges $OT Visit: 1 Procedure OT Evaluation $OT Eval Moderate Complexity: 1 Procedure OT Treatments $Therapeutic Activity: 8-22 mins G-Codes: OT G-codes **NOT FOR INPATIENT CLASS** Functional Assessment Tool Used: AM-PAC 6 Clicks Daily Activity     Galen Manila 04/30/2017, 1:49 PM

## 2017-05-01 LAB — URINE CULTURE

## 2017-05-01 LAB — GLUCOSE, CAPILLARY
Glucose-Capillary: 184 mg/dL — ABNORMAL HIGH (ref 65–99)
Glucose-Capillary: 206 mg/dL — ABNORMAL HIGH (ref 65–99)
Glucose-Capillary: 244 mg/dL — ABNORMAL HIGH (ref 65–99)
Glucose-Capillary: 247 mg/dL — ABNORMAL HIGH (ref 65–99)

## 2017-05-01 LAB — CBC
HCT: 33.5 % — ABNORMAL LOW (ref 36.0–46.0)
Hemoglobin: 10.2 g/dL — ABNORMAL LOW (ref 12.0–15.0)
MCH: 28 pg (ref 26.0–34.0)
MCHC: 30.4 g/dL (ref 30.0–36.0)
MCV: 92 fL (ref 78.0–100.0)
PLATELETS: 106 10*3/uL — AB (ref 150–400)
RBC: 3.64 MIL/uL — AB (ref 3.87–5.11)
RDW: 18.6 % — ABNORMAL HIGH (ref 11.5–15.5)
WBC: 9.4 10*3/uL (ref 4.0–10.5)

## 2017-05-01 LAB — BASIC METABOLIC PANEL
Anion gap: 14 (ref 5–15)
BUN: 30 mg/dL — AB (ref 6–20)
CHLORIDE: 101 mmol/L (ref 101–111)
CO2: 19 mmol/L — ABNORMAL LOW (ref 22–32)
CREATININE: 2.32 mg/dL — AB (ref 0.44–1.00)
Calcium: 8.4 mg/dL — ABNORMAL LOW (ref 8.9–10.3)
GFR calc non Af Amer: 18 mL/min — ABNORMAL LOW (ref >60)
GFR, EST AFRICAN AMERICAN: 21 mL/min — AB (ref >60)
Glucose, Bld: 237 mg/dL — ABNORMAL HIGH (ref 65–99)
Potassium: 5.9 mmol/L — ABNORMAL HIGH (ref 3.5–5.1)
Sodium: 134 mmol/L — ABNORMAL LOW (ref 135–145)

## 2017-05-01 MED ORDER — FUROSEMIDE 10 MG/ML IJ SOLN
40.0000 mg | Freq: Two times a day (BID) | INTRAMUSCULAR | Status: DC
Start: 2017-05-01 — End: 2017-05-02
  Administered 2017-05-01 – 2017-05-02 (×2): 40 mg via INTRAVENOUS
  Filled 2017-05-01 (×2): qty 4

## 2017-05-01 MED ORDER — ACETAMINOPHEN 325 MG PO TABS
650.0000 mg | ORAL_TABLET | Freq: Four times a day (QID) | ORAL | Status: DC | PRN
  Administered 2017-05-01: 650 mg via ORAL
  Filled 2017-05-01: qty 2

## 2017-05-01 NOTE — Progress Notes (Signed)
Patient ID: Danielle Benitez, female   DOB: 05/30/1932, 83 y.o.   MRN: [id number]  PROGRESS NOTE    JESTINE BICKNELL  YNW:[id number] DOB: [birth year] DOA: 04/28/2017  PCP: Joaquim Nam, MD   Brief Narrative:  83 y.o. female with medical history significant for stroke (MRI brain in 03/2017 showed chronic basal ganglia and thalamus infarcts), atrial fibrillation on apixaban, dyslipidemia, diabetes, depression. Pt presented to ED and per family report she apparently was more confused since the morning of the admission, less interactive.   In ED, pt was hemodynamically stable. Blood work was notable for hgb of 10.7, platelets 81, creatinine 2.09 (creatinine 3 weeks ago was 2.22), lactic acid 2.09. CT head showed no acute findings. UA showed trace leukocytes and she was started on IV rocephin.    Assessment & Plan:   Principal Problem:   Acute metabolic encephalopathy - Has had recent MRI in 03/2017 which showed chronic basal ganglia and thalamus infarcts - MRI brain - no acute stroke  - Continue apixaban - ECHO showed normal EF - Carotid doppler showed no significant ICA stenosis - A1c is 8.3 - LDL is 47, pt on Lipitor - Per PT - CIR recommended, order placed  Other Stroke Risk Factors : Advanced age, history of CVA   Active Problems:   Atrial fibrillation - 07/11/15 by PPM interrogation, recurrent 10/19 after DCCV 10/14 - CHADS vas score 7 - Continue apixaban  - Continue amiodarone     Acute lower UTI secondary to E.Coli  - Continue rocephin  - Per sens report, rocephin sens, Cipro resistant     Diabetes mellitus with diabetic nephropathy (HCC) - Continue SSI - CBG's in past 24 hours: 284, 277, 184    Dyslipidemia associated with type 2 diabetes mellitus (HCC) - Continue Lipitor     Chronic diastolic CHF (congestive heart failure) (HCC) - Compensated - Continue Lasix     Depression - Continue Cymbalta     CKD stage 4 - Cr improving since admission  - Cr within  baseline range     Thrombocytopenia - Likely from [facility] with apixaban - Platelets stable     Anemia of chronic kidney disease - Hemoglobin stable    DVT prophylaxis: On apixaban  Code Status: full code  Family Communication: no family at the bedside  Disposition Plan: CIR eval pending    Consultants:   PT  Procedures:   Carotid doppler - no significant ICA stenosis   ECHO - EF 60%  Antimicrobials:   Rocephin 7/19 -->   Subjective: No overnight events.  Objective: Vitals:   04/30/17 2020 05/01/17 0500 05/01/17 0518 05/01/17 0855  BP: (!) 117/57  (!) 98/46 (!) 127/59  Pulse: 70  71 71  Resp:   18   Temp: 98.4 F (36.9 C)  98.5 F (36.9 C)   TempSrc: Oral  Oral   SpO2: 98%  91%   Weight:  86.2 kg (190 lb 0.6 oz)    Height:        Intake/Output Summary (Last 24 hours) at 05/01/17 0950 Last data filed at 04/30/17 2011  Gross per 24 hour  Intake              650 ml  Output             1000 ml  Net             -350 ml   Filed Weights   04/28/17 2032 04/30/17 0642 05/01/17 0500  Weight: 85.6 kg (188 lb 11.2 oz) 86.2 kg (190 lb) 86.2 kg (190 lb 0.6 oz)    Physical Exam  Constitutional: Appears well-developed and well-nourished. No distress.  CVS: RRR, S1/S2 + Pulmonary: Effort and breath sounds normal, no stridor, rhonchi, wheezes, rales.  Abdominal: Soft. BS +,  no distension, tenderness, rebound or guarding.  Musculoskeletal: No swelling, no rhonchi  Lymphadenopathy: No lymphadenopathy noted, cervical, inguinal. Neuro: Alert. No focal deficits  Skin: warm, dry Psychiatric: Normal mood and behavior     Data Reviewed: I have personally reviewed following labs and imaging studies  CBC:  Recent Labs Lab 04/28/17 1545 04/29/17 0501 04/30/17 0254  WBC 8.2 8.2 9.7  NEUTROABS 5.5  --   --   HGB 10.7* 12.0 10.4*  HCT 34.7* 39.9 34.6*  MCV 91.8 92.4 92.3  PLT 81* 87* 99*   Basic Metabolic Panel:  Recent Labs Lab 04/28/17 1545 04/29/17 0501  04/30/17 0254  NA 142 143 138  K 4.1 3.1* 3.7  CL 105 106 103  CO2 27 27 25   GLUCOSE 171* 98 272*  BUN 36* 33* 30*  CREATININE 2.09* 1.80* 1.91*  CALCIUM 8.9 9.2 8.7*   GFR: Estimated Creatinine Clearance: 23.2 mL/min (A) (by C-G formula based on SCr of 1.91 mg/dL (H)). Liver Function Tests:  Recent Labs Lab 04/28/17 1545  AST 82*  ALT 32  ALKPHOS 138*  BILITOT 1.2  PROT 6.9  ALBUMIN 2.7*   No results for input(s): LIPASE, AMYLASE in the last 168 hours. No results for input(s): AMMONIA in the last 168 hours. Coagulation Profile: No results for input(s): INR, PROTIME in the last 168 hours. Cardiac Enzymes: No results for input(s): CKTOTAL, CKMB, CKMBINDEX, TROPONINI in the last 168 hours. BNP (last 3 results)  Recent Labs  09/20/16 1458 03/16/17 1441 04/11/17 1345  PROBNP 140.0* 287.0* 215.0*   HbA1C:  Recent Labs  04/29/17 0501  HGBA1C 8.3*   CBG:  Recent Labs Lab 04/30/17 0830 04/30/17 1219 04/30/17 1634 04/30/17 2019 05/01/17 0741  GLUCAP 130* 171* 284* 277* 184*   Lipid Profile:  Recent Labs  04/29/17 0501  CHOL 99  HDL 35*  LDLCALC 47  TRIG 85  CHOLHDL 2.8   Thyroid Function Tests:  Recent Labs  04/29/17 0501  TSH 2.022   Anemia Panel: No results for input(s): VITAMINB12, FOLATE, FERRITIN, TIBC, IRON, RETICCTPCT in the last 72 hours. Urine analysis:    Component Value Date/Time   COLORURINE YELLOW 04/28/2017 1600   APPEARANCEUR HAZY (A) 04/28/2017 1600   LABSPEC 1.008 04/28/2017 1600   PHURINE 7.0 04/28/2017 1600   GLUCOSEU NEGATIVE 04/28/2017 1600   HGBUR MODERATE (A) 04/28/2017 1600   BILIRUBINUR NEGATIVE 04/28/2017 1600   BILIRUBINUR 1+ 09/20/2016 1424   KETONESUR NEGATIVE 04/28/2017 1600   PROTEINUR NEGATIVE 04/28/2017 1600   UROBILINOGEN 0.2 09/20/2016 1424   UROBILINOGEN 0.2 08/02/2015 0330   NITRITE NEGATIVE 04/28/2017 1600   LEUKOCYTESUR TRACE (A) 04/28/2017 1600   Sepsis  Labs: @LABRCNTIP (procalcitonin:4,lacticidven:4)   ) Recent Results (from the past 240 hour(s))  Urine Culture     Status: Abnormal   Collection Time: 04/28/17  4:07 PM  Result Value Ref Range Status   Specimen Description URINE, RANDOM  Final   Special Requests NONE  Final   Culture >=100,000 COLONIES/mL ESCHERICHIA COLI (A)  Final   Report Status 05/01/2017 FINAL  Final   Organism ID, Bacteria ESCHERICHIA COLI (A)  Final      Susceptibility   Escherichia coli -  MIC*    AMPICILLIN <=2 SENSITIVE Sensitive     CEFAZOLIN <=4 SENSITIVE Sensitive     CEFTRIAXONE <=1 SENSITIVE Sensitive     CIPROFLOXACIN >=4 RESISTANT Resistant     GENTAMICIN <=1 SENSITIVE Sensitive     IMIPENEM <=0.25 SENSITIVE Sensitive     NITROFURANTOIN <=16 SENSITIVE Sensitive     TRIMETH/SULFA <=20 SENSITIVE Sensitive     AMPICILLIN/SULBACTAM <=2 SENSITIVE Sensitive     PIP/TAZO <=4 SENSITIVE Sensitive     Extended ESBL NEGATIVE Sensitive     * >=100,000 COLONIES/mL ESCHERICHIA COLI      Radiology Studies: Ct Head Wo Contrast  Result Date: 04/28/2017 CLINICAL DATA:  83 year old female with a history of altered mental status EXAM: CT HEAD WITHOUT CONTRAST TECHNIQUE: Contiguous axial images were obtained from the base of the skull through the vertex without intravenous contrast. COMPARISON:  MRI 03/31/2017, CT 03/31/2017 FINDINGS: Brain: No acute intracranial hemorrhage. No midline shift or mass effect. Unremarkable configuration the ventricles. Gray-white differentiation maintained. Vascular: Atherosclerotic calcification of anterior and posterior circulation. Senescent calcifications of the basal ganglia Skull: No aggressive bony lesion. Sinuses/Orbits: No acute fracture. Other: None IMPRESSION: No CT evidence of acute intracranial abnormality. Electronically Signed   By: Gilmer Mor D.O.   On: 04/28/2017 16:35   Mr Brain Wo Contrast  Result Date: 04/29/2017 CLINICAL DATA:  83 year old female with altered  mental status. Chronic small vessel infarcts on June 2018 brain MRI. Atrial fibrillation. Confusion, headache. Worsening mental status. EXAM: MRI HEAD WITHOUT CONTRAST TECHNIQUE: Multiplanar, multiecho pulse sequences of the brain and surrounding structures were obtained without intravenous contrast. COMPARISON:  Head CT 04/28/2017.  Brain MRI 03/31/2017. FINDINGS: Brain: Chronic lacunar infarcts in the bilateral thalami. Occasional tiny lacune in the cerebellum. Chronic T2 heterogeneity in the bilateral basal ganglia and cerebral white matter. No cortical encephalomalacia or chronic cerebral blood products identified. No convincing restricted diffusion to suggest acute infarction. No midline shift, mass effect, evidence of mass lesion, ventriculomegaly, extra-axial collection or acute intracranial hemorrhage. Cervicomedullary junction and pituitary are within normal limits. Vascular: Major intracranial vascular flow voids are stable. Skull and upper cervical spine: Chronic cervical spine degeneration, including degenerative spinal stenosis with spinal cord mass effect at C4-C5 and C5-C6 (series 6, images 12 and 13). Visualized bone marrow signal is within normal limits. Sinuses/Orbits: Stable an negative. Other: Visible internal auditory structures appear stable and within normal limits. Mastoids are clear. Visible scalp and face soft tissues appear normal. IMPRESSION: 1. Chronic small vessel ischemia with no acute infarct identified. 2. No acute intracranial abnormality. Stable noncontrast MRI appearance of the brain since June. 3. Chronic cervical spine degeneration and spinal stenosis with some spinal cord mass effect. Electronically Signed   By: Odessa Fleming M.D.   On: 04/29/2017 13:07        Scheduled Meds: . allopurinol  300 mg Oral Daily  . amiodarone  200 mg Oral Daily  . apixaban  2.5 mg Oral BID  . atorvastatin  20 mg Oral q1800  . DULoxetine  20 mg Oral QHS  . furosemide  40 mg Oral QPM  .  furosemide  80 mg Oral Daily  . glimepiride  1 mg Oral Q breakfast  . insulin aspart  0-9 Units Subcutaneous TID WC  . insulin glargine  26 Units Subcutaneous Q2200  . linagliptin  5 mg Oral Daily  . loratadine  5 mg Oral Daily  . metoprolol succinate  25 mg Oral BID  . pantoprazole  40 mg  Oral Daily   Continuous Infusions: . cefTRIAXone (ROCEPHIN)  IV Stopped (04/30/17 2011)     LOS: 3 days    Time spent: 25 minutes  Greater than 50% of the time spent on counseling and coordinating the care.   Manson Passey, MD [facility] Pager [phone]  If 7PM-7AM, please contact night-coverage [email or web address] Password [facility] 05/01/2017, 9:50 AM

## 2017-05-02 DIAGNOSIS — E871 Hypo-osmolality and hyponatremia: Secondary | ICD-10-CM

## 2017-05-02 DIAGNOSIS — N183 Chronic kidney disease, stage 3 unspecified: Secondary | ICD-10-CM

## 2017-05-02 DIAGNOSIS — D62 Acute posthemorrhagic anemia: Secondary | ICD-10-CM

## 2017-05-02 DIAGNOSIS — Z8673 Personal history of transient ischemic attack (TIA), and cerebral infarction without residual deficits: Secondary | ICD-10-CM

## 2017-05-02 DIAGNOSIS — G9341 Metabolic encephalopathy: Secondary | ICD-10-CM

## 2017-05-02 DIAGNOSIS — N390 Urinary tract infection, site not specified: Principal | ICD-10-CM

## 2017-05-02 DIAGNOSIS — N179 Acute kidney failure, unspecified: Secondary | ICD-10-CM

## 2017-05-02 DIAGNOSIS — E1121 Type 2 diabetes mellitus with diabetic nephropathy: Secondary | ICD-10-CM

## 2017-05-02 DIAGNOSIS — I5032 Chronic diastolic (congestive) heart failure: Secondary | ICD-10-CM

## 2017-05-02 DIAGNOSIS — I4891 Unspecified atrial fibrillation: Secondary | ICD-10-CM

## 2017-05-02 LAB — BASIC METABOLIC PANEL
Anion gap: 8 (ref 5–15)
BUN: 31 mg/dL — ABNORMAL HIGH (ref 6–20)
CALCIUM: 8.6 mg/dL — AB (ref 8.9–10.3)
CHLORIDE: 100 mmol/L — AB (ref 101–111)
CO2: 26 mmol/L (ref 22–32)
CREATININE: 2.26 mg/dL — AB (ref 0.44–1.00)
GFR calc Af Amer: 22 mL/min — ABNORMAL LOW (ref >60)
GFR, EST NON AFRICAN AMERICAN: 19 mL/min — AB (ref >60)
Glucose, Bld: 200 mg/dL — ABNORMAL HIGH (ref 65–99)
Potassium: 3.9 mmol/L (ref 3.5–5.1)
SODIUM: 134 mmol/L — AB (ref 135–145)

## 2017-05-02 LAB — VAS US CAROTID
LCCADSYS: -54 cm/s
LCCAPDIAS: -17 cm/s
LCCAPSYS: -71 cm/s
LEFT ECA DIAS: -6 cm/s
LEFT VERTEBRAL DIAS: 17 cm/s
LICAPDIAS: 33 cm/s
Left CCA dist dias: -6 cm/s
Left ICA dist dias: -20 cm/s
Left ICA dist sys: -88 cm/s
Left ICA prox sys: 129 cm/s
RCCADSYS: -64 cm/s
RCCAPDIAS: 12 cm/s
RCCAPSYS: 80 cm/s
RIGHT ECA DIAS: -6 cm/s
RIGHT VERTEBRAL DIAS: 11 cm/s

## 2017-05-02 LAB — GLUCOSE, CAPILLARY
Glucose-Capillary: 180 mg/dL — ABNORMAL HIGH (ref 65–99)
Glucose-Capillary: 200 mg/dL — ABNORMAL HIGH (ref 65–99)

## 2017-05-02 MED ORDER — CEPHALEXIN 500 MG PO CAPS: 500.0000 mg | ORAL_CAPSULE | Freq: Two times a day (BID) | ORAL | 0 refills | Status: AC

## 2017-05-02 NOTE — Progress Notes (Signed)
Danielle Benitez to be D/C'd Home per MD order.  Discussed with the patient and all questions fully answered.  VSS, Skin clean, dry and intact without evidence of skin break down, no evidence of skin tears noted. IV catheter discontinued intact. Site without signs and symptoms of complications. Dressing and pressure applied.  An After Visit Summary was printed and given to the patient. Patient received prescription.  D/c education completed with patient/family including follow up instructions, medication list, d/c activities limitations if indicated, with other d/c instructions as indicated by MD - patient able to verbalize understanding, all questions fully answered.   Patient instructed to return to ED, call 911, or call MD for any changes in condition.   Patient escorted via WC, and D/C home via private auto.  Danielle Benitez 05/02/2017 10:54 AM

## 2017-05-02 NOTE — Consult Note (Signed)
Physical Medicine and Rehabilitation Consult  Reason for Consult: Recurrent encephalopathy.  Referring Physician:  Dr. Elisabeth Pigeon   HPI: Danielle Benitez is a 83 y.o. female with history of T2DM with nephropathy, chronic diastolic CHF, A fib, prior strokes who was admitted on 04/29/17 with increase in confusion.  History taken from chart review and daughter. She was started on IV Rocephin for UTI and MRI brain done showing chronic small vessel disease and no acute infarcts. 2D echo with EF 60-65% and moderate TVR and mildly increased PA. Patient with decreased safety awareness, impulsivity and difficulty walking. CIR recommended for follow up therapy. Patient and daughter feel patient is at baseline and would like to return home.   Review of Systems  Constitutional: Negative for chills and fever.  Cardiovascular: Negative for palpitations.  Musculoskeletal: Negative for back pain and neck pain.  Neurological: Positive for weakness. Negative for dizziness, sensory change and headaches.  All other systems reviewed and are negative.     Past Medical History:  Diagnosis Date  . Abnormality of gait   . Anemia, unspecified   . Anticoagulated- Eliquis 07/31/2015  . Anxiety state, unspecified   . Aortic stenosis    a. Mild by echo 08/2013, not seen on repeat echo 02/2017.  Marland Kitchen Bifascicular block    afib  . Carotid artery disease (HCC)    a. Carotid dopplers (9/17) with 40-59% LICA stenosis (of note, prior ABI normal).  . Chronic diastolic CHF (congestive heart failure) (HCC)    a. Dx 07/2015 - EF 35-40%, unclear etiology ? r/t afib or viral etiology. b. EF normal in 02/2017.  . CKD (chronic kidney disease), stage III   . Depressive disorder, not elsewhere classified   . DM (diabetes mellitus), type 2, uncontrolled, with renal complications (HCC) 01/08/2012  . Eczema 11/22/2013  . Esophageal dysmotility 08/05/2015  . Essential hypertension   . Female stress incontinence 05/06/2015  . Full  dentures   . GERD (gastroesophageal reflux disease)   . Gout   . HOH (hard of hearing)   . Internal hemorrhoids without mention of complication   . Iron deficiency   . Lumbago   . Memory loss   . Mild CAD    a. Cath 2013: 20-30% prox RCA.  Marland Kitchen Myocardial infarction (HCC) 07/2015  . Nonspecific abnormal results of liver function study   . Obesity, unspecified   . Orthostatic hypotension   . Osteoarthritis   . Other and unspecified hyperlipidemia   . PAF (paroxysmal atrial fibrillation) (HCC) 07/11/2015   a. prior DCCVs, amiodarone, anticoagulation.  . Pain in joint, shoulder region 11/22/2013   left   . Personal history of fall   . Presence of permanent cardiac pacemaker   . Pruritus 11/22/2013  . S/P cardiac pacemaker procedure 01/11/2012   a. for symptomatic bradycardia. Followed by Dr. Ladona Ridgel. Device changed out 2017 due to ppm infection.  . Stroke [facility])    a. evidence of prior strokes on MRI in 2018.  Marland Kitchen UTI (urinary tract infection) 04/2017  . Wears glasses     Past Surgical History:  Procedure Laterality Date  . CARDIAC CATHETERIZATION  12/03/2003   Dr Clarene Duke  . CARDIOVERSION N/A 07/25/2015   Procedure: CARDIOVERSION;  Surgeon: Pricilla Riffle, MD;  Location: [facility] ENDOSCOPY;  Service: Cardiovascular;  Laterality: N/A;  . CARDIOVERSION N/A 08/06/2015   Procedure: CARDIOVERSION;  Surgeon: Laurey Morale, MD;  Location: [facility] ENDOSCOPY;  Service: Cardiovascular;  Laterality: N/A;  . CATARACT  EXTRACTION W/ INTRAOCULAR LENS  IMPLANT, BILATERAL Bilateral 03/2015-04/2015  . COLONOSCOPY     ?????  . EP IMPLANTABLE DEVICE N/A 04/15/2016   Procedure: Pacemaker Implant;  Surgeon: Marinus Maw, MD;  Location: [facility] INVASIVE CV LAB;  Service: Cardiovascular;  Laterality: N/A;  . ESOPHAGOGASTRODUODENOSCOPY N/A 08/08/2015   Procedure: ESOPHAGOGASTRODUODENOSCOPY (EGD);  Surgeon: Iva Boop, MD;  Location: [facility] ENDOSCOPY;  Service: Endoscopy;  Laterality: N/A;  . EXTREMITY CYST EXCISION     finger   . EYE SURGERY Bilateral    catartact  . INSERT / REPLACE / REMOVE PACEMAKER    . LEFT HEART CATHETERIZATION WITH CORONARY ANGIOGRAM N/A 01/10/2012   Procedure: LEFT HEART CATHETERIZATION WITH CORONARY ANGIOGRAM;  Surgeon: Chrystie Nose, MD;  Location: [facility] CATH LAB;  Service: Cardiovascular;  Laterality: N/A;  . ORIF WRIST FRACTURE Right 04/09/2014   Procedure: OPEN REDUCTION INTERNAL FIXATION (ORIF) RIGHT DISPLACED  DISTAL RADIUS  FRACTURE;  Surgeon: Dominica Severin, MD;  Location: [facility];  Service: Orthopedics;  Laterality: Right;  . PACEMAKER LEAD REMOVAL  04/12/2016   TEMPORARY PACEMAKER INSERTION  . PACEMAKER LEAD REMOVAL  04/15/2016   Procedure: Pacemaker Lead Removal;  Surgeon: Marinus Maw, MD;  Location: [facility] INVASIVE CV LAB;  Service: Cardiovascular;;  . PACEMAKER LEAD REMOVAL N/A 04/12/2016   Procedure: PACEMAKER LEAD REMOVAL;  Surgeon: Marinus Maw, MD;  Location: [facility] OR;  Service: Cardiovascular;  Laterality: N/A;  . PERMANENT PACEMAKER INSERTION Left 01/11/2012   Procedure: PERMANENT PACEMAKER INSERTION;  Surgeon: Thurmon Fair, MD;  Location: [facility] CATH LAB;  Service: Cardiovascular;  Laterality: Left;  . TEE WITHOUT CARDIOVERSION N/A 07/25/2015   Procedure: TRANSESOPHAGEAL ECHOCARDIOGRAM (TEE);  Surgeon: Pricilla Riffle, MD;  Location: [facility] ENDOSCOPY;  Service: Cardiovascular;  Laterality: N/A;    Family History  Problem Relation Age of Onset  . Breast cancer Mother   . Cancer Mother   . Diabetes Daughter   . Hypertension Daughter   . Cancer Brother     Social History:  Lives with family per reports that she has never smoked. She has never used smokeless tobacco. Per reports that she does not drink alcohol or use drugs.   Allergies  Allergen Reactions  . Beta Adrenergic Blockers Other (See Comments)    Couldn't speak or hear Metoprolol seems ok  . Jardiance [Empagliflozin] Other (See Comments)    Rash, kidney issues  . Spironolactone     Hyperkalemia     Medications Prior to Admission  Medication Sig Dispense Refill  . albuterol (PROVENTIL HFA;VENTOLIN HFA) 108 (90 Base) MCG/ACT inhaler Inhale 2 puffs into the lungs every 4 (four) hours as needed for wheezing or shortness of breath (cough, shortness of breath or wheezing.). 1 Inhaler 1  . allopurinol (ZYLOPRIM) 300 MG tablet TAKE 1 TABLET BY MOUTH DAILY (Patient taking differently: TAKE 300MG  BY MOUTH DAILY) 90 tablet 1  . amiodarone (PACERONE) 200 MG tablet TAKE 1 TABLET BY MOUTH DAILY (Patient taking differently: TAKE 200MG  BY MOUTH DAILY) 90 tablet 3  . atorvastatin (LIPITOR) 20 MG tablet Take 1 tablet (20 mg total) by mouth daily at 6 PM. 30 tablet 0  . DULoxetine (CYMBALTA) 20 MG capsule TAKE ONE CAPSULE BY MOUTH EVERY NIGHT AT BEDTIME (Patient taking differently: TAKE 20MG  BY MOUTH EVERY NIGHT AT BEDTIME) 90 capsule 1  . ELIQUIS 2.5 MG TABS tablet TAKE 1 TABLET BY MOUTH TWICE A DAY (Patient taking differently: TAKE 2.5MG  BY MOUTH TWICE A DAY) 180 tablet 3  .  fluticasone (FLONASE) 50 MCG/ACT nasal spray Place 1 spray into both nostrils daily as needed for allergies or rhinitis.    . furosemide (LASIX) 40 MG tablet Take 2 tablets (80 mg total) each morning and 1 tablet (40 mg total) each evening. (Patient taking differently: Take 40 mg by mouth every evening. ) 30 tablet 0  . furosemide (LASIX) 80 MG tablet Take 80 mg by mouth daily with breakfast.    . GAS RELIEF 80 MG chewable tablet CHEW ONE TABLET BY MOUTH EVERY 6 HOURS AS NEEDED FOR GAS. 30 tablet 5  . glimepiride (AMARYL) 1 MG tablet TAKE ONE TABLET BY MOUTH EVERY MORNING WITH BREAKFAST (Patient taking differently: TAKE 1MG  BY MOUTH EVERY MORNING WITH BREAKFAST) 90 tablet 1  . Insulin Glargine (LANTUS SOLOSTAR) 100 UNIT/ML Solostar Pen Inject 26 Units into the skin daily at 10 pm. (Patient taking differently: Inject 25-27 Units into the skin See admin instructions. Daily at 10pm. Per sliding scale, based on the total units she got the  night before and what blood sugar level currently is. If blood sugar 100-150 give same units as last time, greater than 151 increase by 1 unit.)    . loratadine (CLARITIN CHILDRENS) 5 MG chewable tablet Chew 1 tablet (5 mg total) by mouth daily.    . metoprolol succinate (TOPROL-XL) 25 MG 24 hr tablet Take 1 tablet (25 mg total) by mouth 2 (two) times daily. 60 tablet 0  . Multiple Vitamins-Minerals (ONE-A-DAY 50 PLUS PO) Take 1 capsule by mouth daily.    . ONGLYZA 2.5 MG TABS tablet TAKE 1 TABLET BY MOUTH DAILY (Patient taking differently: TAKE 2.5MG  BY MOUTH DAILY) 90 tablet 1  . pantoprazole (PROTONIX) 40 MG tablet TAKE 1 TABLET BY MOUTH DAILY (Patient taking differently: TAKE 40MG  BY MOUTH DAILY) 30 tablet 5  . senna-docusate (SENOKOT-S) 8.6-50 MG tablet Take 1 tablet by mouth daily as needed for mild constipation.    . traZODone (DESYREL) 50 MG tablet Take 0.5-1 tablets (25-50 mg total) by mouth at bedtime as needed for sleep. Don't take >50mg  in a day. 30 tablet 3  . Insulin Pen Needle (PEN NEEDLES) 30G X 5 MM MISC Inject 1 Dose as directed daily. Use with insulin pen (Patient not taking: Reported on 04/28/2017) 100 each 3  . ONE TOUCH ULTRA TEST test strip USE TO CHECK BLOOD SUGAR ONCE A DAY AS DIRECTED (Patient not taking: Reported on 04/28/2017) 100 each 3    Home: Home Living Family/patient expects to be discharged to:: Private residence Living Arrangements: Children Available Help at Discharge: Family, Friend(s), Available 24 hours/day Type of Home: Apartment Home Access: Level entry Home Layout: One level Bathroom Shower/Tub: Hydrographic surveyor, Engineer, building services: Standard Bathroom Accessibility: No Home Equipment: Environmental consultant - 2 wheels, Cane - quad, Environmental consultant - 4 wheels, Shower seat  Lives With: Daughter  Functional History: Prior Function Level of Independence: Needs assistance Gait / Transfers Assistance Needed: RW for ambulation  ADL's / Homemaking Assistance Needed: assist  with bathing Comments: Daughter works from home and provides 24/7 supervision. Functional Status:  Mobility: Bed Mobility Overal bed mobility: Modified Independent Bed Mobility: Supine to Sit Supine to sit: Min assist General bed mobility comments: no assist, increased time Transfers Overall transfer level: Needs assistance Equipment used: Rolling walker (2 wheeled) Transfers: Sit to/from Stand Sit to Stand: Min guard Stand pivot transfers: Min assist General transfer comment: sits impulsively, cues for hand placement and safety Ambulation/Gait General Gait Details: unable    ADL:  ADL Overall ADL's : Needs assistance/impaired Grooming: Brushing hair, Standing, Min guard Upper Body Bathing: Min guard, Sitting Lower Body Bathing: Moderate assistance Upper Body Dressing : Set up, Sitting Lower Body Dressing: Minimal assistance, Sit to/from stand Lower Body Dressing Details (indicate cue type and reason): socks, with effort and bed in lowest positiong Toilet Transfer: Min guard, Ambulation, RW, BSC Toileting- Clothing Manipulation and Hygiene: Minimal assistance, Sit to/from stand Functional mobility during ADLs: Min guard, Rolling walker General ADL Comments: Pt has theraputty exercises at home, daughter will encourage and supervise ADL, plans to get a tub transfer bench and puzzles/tablet for pt   Cognition: Cognition Overall Cognitive Status: History of cognitive impairments - at baseline Arousal/Alertness: Awake/alert Orientation Level: Oriented X4 Memory: Impaired Memory Impairment: Retrieval deficit, Decreased short term memory Decreased Short Term Memory: Verbal basic, Functional basic Awareness: Appears intact Problem Solving: Appears intact Behaviors: Perseveration Safety/Judgment: Appears intact Cognition Arousal/Alertness: Awake/alert Behavior During Therapy: WFL for tasks assessed/performed Overall Cognitive Status: History of cognitive impairments - at  baseline General Comments: has been home with daughter who works from home  Blood pressure 121/83, pulse 71, temperature 98.7 F (37.1 C), resp. rate 16, height 5\' 3"  (1.6 m), weight 69.9 kg (154 lb), SpO2 95 %. Physical Exam  Vitals reviewed. Constitutional: She is oriented to person, place, and time. She appears well-developed and well-nourished.  HENT:  Head: Normocephalic and atraumatic.  Eyes: EOM are normal. Right eye exhibits no discharge. Left eye exhibits no discharge.  Neck: Normal range of motion. Neck supple.  Cardiovascular:  Irregularly irregular  Respiratory: Effort normal and breath sounds normal.  GI: Soft. Bowel sounds are normal.  Musculoskeletal: She exhibits no edema or tenderness.  Neurological: She is alert and oriented to person, place, and time.  Motor: 4-4+/5 throughout Sensation intact to light touch  Skin: Skin is warm and dry.  Psychiatric: She has a normal mood and affect. Her behavior is normal.    Results for orders placed or performed during the hospital encounter of 04/28/17 (from the past 24 hour(s))  Glucose, capillary     Status: Abnormal   Collection Time: 05/01/17 12:18 PM  Result Value Ref Range   Glucose-Capillary 244 (H) 65 - 99 mg/dL  Glucose, capillary     Status: Abnormal   Collection Time: 05/01/17  4:56 PM  Result Value Ref Range   Glucose-Capillary 247 (H) 65 - 99 mg/dL  Glucose, capillary     Status: Abnormal   Collection Time: 05/01/17 11:43 PM  Result Value Ref Range   Glucose-Capillary 206 (H) 65 - 99 mg/dL  Basic metabolic panel     Status: Abnormal   Collection Time: 05/02/17  1:59 AM  Result Value Ref Range   Sodium 134 (L) 135 - 145 mmol/L   Potassium 3.9 3.5 - 5.1 mmol/L   Chloride 100 (L) 101 - 111 mmol/L   CO2 26 22 - 32 mmol/L   Glucose, Bld 200 (H) 65 - 99 mg/dL   BUN 31 (H) 6 - 20 mg/dL   Creatinine, Ser 1.61 (H) 0.44 - 1.00 mg/dL   Calcium 8.6 (L) 8.9 - 10.3 mg/dL   GFR calc non Af Amer 19 (L) >60 mL/min    GFR calc Af Amer 22 (L) >60 mL/min   Anion gap 8 5 - 15  Glucose, capillary     Status: Abnormal   Collection Time: 05/02/17  8:40 AM  Result Value Ref Range   Glucose-Capillary 180 (H) 65 - 99 mg/dL  No results found.  Assessment/Plan: Diagnosis: Recurrent encephalopathy Labs independently reviewed.  Records reviewed and summated above.  1. Does the need for close, 24 hr/day medical supervision in concert with the patient's rehab needs make it unreasonable for this patient to be served in a less intensive setting? No  2. Co-Morbidities requiring supervision/potential complications: T2DM with nephropathy (DM (Monitor in accordance with exercise and adjust meds as necessary), chronic diastolic CHF (monitor for sign/symptoms of fluid overload), A fib (monitor HR with increased activity), prior strokes, hyponatremia (cont to monitor, treat if necessary), AKI on CKD (avoid nephrotoxic meds), ABLA (transfuse if necessary to ensure appropriate perfusion for increased activity tolerance) 3. Due to safety and patient education, does the patient require 24 hr/day rehab nursing? No 4. Does the patient require coordinated care of a physician, rehab nurse, PT (1-2 hrs/day, 5 days/week) and OT (1-2 hrs/day, 5 days/week) to address physical and functional deficits in the context of the above medical diagnosis(es)? No Addressing deficits in the following areas: NA 5. Can the patient actively participate in an intensive therapy program of at least 3 hrs of therapy per day at least 5 days per week? Yes 6. The potential for patient to make measurable gains while on inpatient rehab is fair 7. Anticipated functional outcomes upon discharge from inpatient rehab are n/a  with PT, n/a with OT, n/a with SLP. 8. Estimated rehab length of stay to reach the above functional goals is: NA 9. Anticipated D/C setting: Home 10. Anticipated post D/C treatments: HH therapy and Home excercise program 11. Overall  Rehab/Functional Prognosis: good  RECOMMENDATIONS: This patient's condition is appropriate for continued rehabilitative care in the following setting: Agree with family and therapies, recommend home with [facility] with PM&R outpt follow up.   Patient has agreed to participate in recommended program. Yes Note that insurance prior authorization may be required for reimbursement for recommended care.  Comment: Rehab Admissions Coordinator to follow up.  Maryla Morrow, MD, [address], [place] 05/02/2017

## 2017-05-02 NOTE — Progress Notes (Signed)
Pt to d/c home. [phone]

## 2017-05-02 NOTE — Progress Notes (Signed)
Physical Therapy Treatment Patient Details Name: Danielle Benitez MRN: [id number] DOB: July 21, 1932 Today's Date: 05/02/2017    History of Present Illness  83 yo female admitted with R side weakness and AMS, (-) MRI for new stroke since having June 2018 event.  Acute metabolic encephalopathy, was originally not taken into CIR then.  PMHx:  CHF, HTN, anemia, bradycardia, pacer, a-fib, CKD 3, HOH, DM, gout,     PT Comments    Pt is up to walk with PT and noted unsteadiness of R knee today.  Her plan is to follow acutely for gait and strength until DC and had hoped for her to go to inpt therapy for strengthening and balance training.  Her daughter has some specific concerns about using an inpt service so will follow her until her DC is done from hosp.  Has improved from her last PT visit so hopefully will continue in this path.   Follow Up Recommendations  CIR     Equipment Recommendations  None recommended by PT    Recommendations for Other Services Rehab consult     Precautions / Restrictions Precautions Precautions: Fall Restrictions Weight Bearing Restrictions: No Other Position/Activity Restrictions: use care for R knee instability with edema and pain    Mobility  Bed Mobility Overal bed mobility: Needs Assistance Bed Mobility: Sit to Supine       Sit to supine: Min assist   General bed mobility comments: had to assist her legs onto bed  Transfers Overall transfer level: Needs assistance Equipment used: Rolling walker (2 wheeled) Transfers: Sit to/from Stand Sit to Stand: Min guard         General transfer comment: pt is demonstrating a need to get cues for sequence but is listening to instructions  Ambulation/Gait Ambulation/Gait assistance: Min guard Ambulation Distance (Feet): 35 Feet Assistive device: Rolling walker (2 wheeled);1 person hand held assist Gait Pattern/deviations: Step-through pattern;Wide base of support;Shuffle;Decreased stride length;Trunk  flexed;Decreased weight shift to right;Decreased stance time - right Gait velocity: reduced Gait velocity interpretation: Below normal speed for age/gender General Gait Details: avoided excessive distances due to pain and instability on R knee, pt is slowing speed as she walks due to this   Stairs            Wheelchair Mobility    Modified Rankin (Stroke Patients Only)       Balance Overall balance assessment: Needs assistance Sitting-balance support: Feet supported;Bilateral upper extremity supported Sitting balance-Leahy Scale: Good Sitting balance - Comments: no LOB with donning socks Postural control: Posterior lean Standing balance support: Bilateral upper extremity supported;During functional activity Standing balance-Leahy Scale: Poor Standing balance comment: requires B UE support                            Cognition Arousal/Alertness: Awake/alert Behavior During Therapy: WFL for tasks assessed/performed Overall Cognitive Status: History of cognitive impairments - at baseline                                 General Comments: has been home with daughter who works from Manufacturing systems engineer Exercises - Lower Extremity Ankle Circles/Pumps: AROM;AAROM;Both;10 reps Quad Sets: AROM;Both;10 reps Heel Slides: AROM;AAROM;Both;10 reps Hip ABduction/ADduction: AROM;AAROM;Both;10 reps    General Comments General comments (skin integrity, edema, etc.): Pt is with her daughter when PT arrived and were able to discuss her HHPT  plans for follow up therapy, noted her instability with RLE to walk      Pertinent Vitals/Pain Pain Assessment: Faces Faces Pain Scale: Hurts little more Pain Location: R knee Pain Descriptors / Indicators: Sore;Guarding Pain Intervention(s): Limited activity within patient's tolerance;Monitored during session;Premedicated before session;Repositioned    Home Living                      Prior Function             PT Goals (current goals can now be found in the care plan section) Acute Rehab PT Goals Patient Stated Goal: get home and return to HHPT with a therapist she trusts Progress towards PT goals: Progressing toward goals    Frequency    Min 3X/week      PT Plan Current plan remains appropriate    Co-evaluation              AM-PAC PT "6 Clicks" Daily Activity  Outcome Measure  Difficulty turning over in bed (including adjusting bedclothes, sheets and blankets)?: A Little Difficulty moving from lying on back to sitting on the side of the bed? : A Little Difficulty sitting down on and standing up from a chair with arms (e.g., wheelchair, bedside commode, etc,.)?: A Little Help needed moving to and from a bed to chair (including a wheelchair)?: A Little Help needed walking in hospital room?: A Little Help needed climbing 3-5 steps with a railing? : A Lot 6 Click Score: 17    End of Session Equipment Utilized During Treatment: Gait belt Activity Tolerance: Patient limited by fatigue Patient left: in bed;with call bell/phone within reach;with family/visitor present (bed alarm off when PT arrived) Nurse Communication: Mobility status PT Visit Diagnosis: Unsteadiness on feet (R26.81);Muscle weakness (generalized) (M62.81);Difficulty in walking, not elsewhere classified (R26.2)     Time: 4098-1191 PT Time Calculation (min) (ACUTE ONLY): 29 min  Charges:  $Gait Training: 8-22 mins $Therapeutic Exercise: 8-22 mins                    G Codes:  Functional Assessment Tool Used: AM-PAC 6 Clicks Basic Mobility    Ivar Drape 05/02/2017, 1:11 PM   Samul Dada, PT MS Acute Rehab Dept. Number: [facility] [id number] and [facility] [phone]

## 2017-05-02 NOTE — Discharge Instructions (Signed)

## 2017-05-02 NOTE — Care Management Important Message (Signed)
Important Message  Patient Details  Name: ZIYAH MANGLICMOT MRN: [id number] Date of Birth: [birth year]   Medicare Important Message Given:  Yes    Kyla Balzarine 05/02/2017, 9:35 AM

## 2017-05-02 NOTE — Consult Note (Signed)
   [facility] CM Inpatient Consult   05/02/2017  Danielle Benitez 07-Oct-1932 [id number]   Patient was assessed for multiple admissions and re-admission and to check for [facility] Care Management needs for community follow up.  Chart review reveals the patient is Danielle Benitez is a 83 y.o. female with history of T2DM with nephropathy, chronic diastolic CHF, A fib, prior strokes per MD notes.  Met with the patient and her daughter regarding needs.  Daughter and patient states the patient has decided to go home with home health care for her therapy.  Explained the [place] Management program for post hospital follow up.  They endorse Dr. Elsie Stain as her primary care provider at  [facility].  She states that they have close follow up from the MD office and feels she will do well at home with home health care. Daughter verbalized understanding of [facility] for community resources. Given a brochure, 24 hour nurse advise line magnet and encouraged to call especially for needs of a higher level of care.   For questions, please contact:  Natividad Brood, RN BSN [facility] Liaison  [phone] business mobile phone Toll free office [phone]

## 2017-05-02 NOTE — Care Management Note (Signed)
Case Management Note  Patient Details  Name: Danielle Benitez MRN: [id number] Date of Birth: 28-Oct-1931  Subjective/Objective: Pt presented for increased confusion. Pt is from home with the support of daughter. Previously active with [facility] for PT/OT. New orders for HHRN/ Aide. Pt has DME RW/ Rollator and 3n1. Pt wants tub bench- which is an out of pocket cost. Per daughter she will go by [employer] store to purchase one- no order needed.                    Action/Plan: Referral called to Elnita Maxwell with [facility]- SOC to begin within 24-48 hours post d/c. Daughter to provide transportation home. No further needs from CM at this time.   Expected Discharge Date:  05/02/17               Expected Discharge Plan:  Home w Home Health Services  In-House Referral:  NA  Discharge planning Services  CM Consult  Post Acute Care Choice:  Home Health, Resumption of Svcs/PTA Provider Choice offered to:  Patient  DME Arranged:  N/A DME Agency:  NA  HH Arranged:  RN, PT, OT, Nurse's Aide HH Agency:  [facility]  Status of Service:  Completed, signed off  If discussed at Long Length of Stay Meetings, dates discussed:    Additional Comments:  Gala Lewandowsky, RN 05/02/2017, 10:20 AM

## 2017-05-02 NOTE — Discharge Summary (Addendum)
Physician Discharge Summary  Danielle Benitez MVV:[id number] DOB: 05-29-1932 DOA: 04/28/2017  PCP: Joaquim Nam, MD  Admit date: 04/28/2017 Discharge date: 05/02/2017  Recommendations for Outpatient Follow-up:  Please continue keflex for 7 days on discharge. Since patient is on lasix please continue to monitor renal function in outpatient setting. Creatinine is 2.2 (this is her baseline value).  Discharge Diagnoses:  Principal Problem:   Acute metabolic encephalopathy Active Problems:   Atrial fibrillation - 07/11/15 by PPM interrogation, recurrent 10/19 after DCCV 10/14   Anticoagulated- Eliquis   Cardiac pacemaker in situ   Acute lower UTI   Diabetes mellitus with diabetic nephropathy (HCC)   Dyslipidemia associated with type 2 diabetes mellitus (HCC)   Chronic diastolic CHF (congestive heart failure) (HCC)   UTI (urinary tract infection)    Discharge Condition: stable   Diet recommendation: as tolerated   History of present illness:  83 y.o.femalewith medical history significant for stroke (MRI brain in 03/2017 showed chronic basal ganglia and thalamus infarcts), atrial fibrillation on apixaban, dyslipidemia, diabetes, depression. Pt presented to ED and per family report she apparently was more confused since the morning of the admission, less interactive.  In ED, pt was hemodynamically stable. Blood work was notable for hgb of 10.7, platelets 81, creatinine 2.09 (creatinine 3 weeks ago was 2.22), lactic acid 2.09. CT head showed no acute findings. UA showed trace leukocytes and she was started on IV rocephin.   Hospital Course:   Principal Problem: Acute metabolic encephalopathy - Probably due to UTI - Has had recent MRI in 03/2017 which showed chronic basal ganglia and thalamus infarcts - MRI brain - no acute stroke  - Continue apixaban - ECHO showed normal EF - Carotid doppler showed no significant ICA stenosis - A1c is 8.3 - LDL is 47, pt on Lipitor - PT  recommended CIR but pt declined it, pt looks much better this am Other Stroke Risk Factors : Advanced age, history of CVA   Active Problems: Atrial fibrillation - 07/11/15 by PPM interrogation, recurrent 10/19 after DCCV 10/14 - CHADS vas score 7 - Continue apixaban  - Continue amiodarone   Acute lower UTI secondary to E.Coli  - Continue rocephin  - Resistant to Cipro so will use keflex for 7 days   Diabetes mellitus with diabetic nephropathy (HCC) - Continue insulin per home regimen   Dyslipidemia associated with type 2 diabetes mellitus (HCC) - Continue Lipitor   Chronic diastolic CHF (congestive heart failure) (HCC) - Compensated - Continue lasix but continue to monitor renal function   Depression - Continue Cymbalta   CKD stage 4 - Cr improving since admission  - Cr within baseline range - Baseline creatinine in 07/2015 was 3.41  Thrombocytopenia - Likely from [facility] with apixaban - Stable   Anemia of chronic kidney disease - hgb stable    DVT prophylaxis: On apixaban Code Status: full code  Family Communication: daughter at bedside, updated and pt wants to go home instead of inpatient rehab     Consultants:   PT  Procedures:   Carotid doppler - no significant ICA stenosis   ECHO - EF 60%  Antimicrobials:   Rocephin 7/19 --> 7/23  Signed:  Manson Passey, MD  [facility] 05/02/2017, 9:16 AM  Pager #: [phone]  Time spent in minutes: more than 30 minutes    Discharge Exam: Vitals:   05/02/17 0544 05/02/17 0851  BP: (!) 119/48 121/83  Pulse: 70 71  Resp: 16   Temp: 98.7 F (  37.1 C)    Vitals:   05/01/17 2159 05/02/17 0500 05/02/17 0544 05/02/17 0851  BP: (!) 100/51  (!) 119/48 121/83  Pulse: 70  70 71  Resp: 16  16   Temp: 98.6 F (37 C)  98.7 F (37.1 C)   TempSrc: Oral     SpO2: 97%  95%   Weight:  69.9 kg (154 lb)    Height:        General: Pt is alert, follows commands  appropriately, not in acute distress Cardiovascular: Regular rate and rhythm, S1/S2 +, no murmurs Respiratory: Clear to auscultation bilaterally, no wheezing, no crackles, no rhonchi Abdominal: Soft, non tender, non distended, bowel sounds +, no guarding Extremities: no edema, no cyanosis, pulses palpable bilaterally DP and PT Neuro: Grossly nonfocal  Discharge Instructions  Discharge Instructions    Call MD for:  persistant nausea and vomiting    Complete by:  As directed    Call MD for:  redness, tenderness, or signs of infection (pain, swelling, redness, odor or green/yellow discharge around incision site)    Complete by:  As directed    Call MD for:  severe uncontrolled pain    Complete by:  As directed    Diet - low sodium heart healthy    Complete by:  As directed    Discharge instructions    Complete by:  As directed    Please continue keflex for 7 days on discharge. Since patient is on lasix please continue to monitor renal function in outpatient setting. Creatinine is 2.2 (this is her baseline value).   Increase activity slowly    Complete by:  As directed      Allergies as of 05/02/2017      Reactions   Beta Adrenergic Blockers Other (See Comments)   Couldn't speak or hear Metoprolol seems ok   Jardiance [empagliflozin] Other (See Comments)   Rash, kidney issues   Spironolactone    Hyperkalemia      Medication List    TAKE these medications   albuterol 108 (90 Base) MCG/ACT inhaler Commonly known as:  PROVENTIL HFA;VENTOLIN HFA Inhale 2 puffs into the lungs every 4 (four) hours as needed for wheezing or shortness of breath (cough, shortness of breath or wheezing.).   allopurinol 300 MG tablet Commonly known as:  ZYLOPRIM TAKE 1 TABLET BY MOUTH DAILY What changed:  See the new instructions.   amiodarone 200 MG tablet Commonly known as:  PACERONE TAKE 1 TABLET BY MOUTH DAILY What changed:  See the new instructions.   atorvastatin 20 MG tablet Commonly  known as:  LIPITOR Take 1 tablet (20 mg total) by mouth daily at 6 PM.   cephALEXin 500 MG capsule Commonly known as:  KEFLEX Take 1 capsule (500 mg total) by mouth 2 (two) times daily.   DULoxetine 20 MG capsule Commonly known as:  CYMBALTA TAKE ONE CAPSULE BY MOUTH EVERY NIGHT AT BEDTIME What changed:  See the new instructions.   ELIQUIS 2.5 MG Tabs tablet Generic drug:  apixaban TAKE 1 TABLET BY MOUTH TWICE A DAY What changed:  See the new instructions.   fluticasone 50 MCG/ACT nasal spray Commonly known as:  FLONASE Place 1 spray into both nostrils daily as needed for allergies or rhinitis.   furosemide 80 MG tablet Commonly known as:  LASIX Take 80 mg by mouth daily with breakfast. What changed:  Another medication with the same name was changed. Make sure you understand how and when to take  each.   furosemide 40 MG tablet Commonly known as:  LASIX Take 2 tablets (80 mg total) each morning and 1 tablet (40 mg total) each evening. What changed:  how much to take  how to take this  when to take this  additional instructions   GAS RELIEF 80 MG chewable tablet Generic drug:  simethicone CHEW ONE TABLET BY MOUTH EVERY 6 HOURS AS NEEDED FOR GAS.   glimepiride 1 MG tablet Commonly known as:  AMARYL TAKE ONE TABLET BY MOUTH EVERY MORNING WITH BREAKFAST What changed:  See the new instructions.   Insulin Glargine 100 UNIT/ML Solostar Pen Commonly known as:  LANTUS SOLOSTAR Inject 26 Units into the skin daily at 10 pm. What changed:  how much to take  when to take this  additional instructions   loratadine 5 MG chewable tablet Commonly known as:  CLARITIN CHILDRENS Chew 1 tablet (5 mg total) by mouth daily.   metoprolol succinate 25 MG 24 hr tablet Commonly known as:  TOPROL-XL Take 1 tablet (25 mg total) by mouth 2 (two) times daily.   ONE TOUCH ULTRA TEST test strip Generic drug:  glucose blood USE TO CHECK BLOOD SUGAR ONCE A DAY AS DIRECTED    ONE-A-DAY 50 PLUS PO Take 1 capsule by mouth daily.   ONGLYZA 2.5 MG Tabs tablet Generic drug:  saxagliptin HCl TAKE 1 TABLET BY MOUTH DAILY What changed:  See the new instructions.   pantoprazole 40 MG tablet Commonly known as:  PROTONIX TAKE 1 TABLET BY MOUTH DAILY What changed:  See the new instructions.   Pen Needles 30G X 5 MM Misc Inject 1 Dose as directed daily. Use with insulin pen   senna-docusate 8.6-50 MG tablet Commonly known as:  Senokot-S Take 1 tablet by mouth daily as needed for mild constipation.   traZODone 50 MG tablet Commonly known as:  DESYREL Take 0.5-1 tablets (25-50 mg total) by mouth at bedtime as needed for sleep. Don't take >50mg  in a day.      Follow-up Information    Joaquim Nam, MD. Schedule an appointment as soon as possible for a visit in 1 week(s).   Specialty:  Family Medicine Why:  Check creatinine  Contact information: [address] [place] [address] [phone]            The results of significant diagnostics from this hospitalization (including imaging, microbiology, ancillary and laboratory) are listed below for reference.    Significant Diagnostic Studies: Ct Head Wo Contrast  Result Date: 04/28/2017 CLINICAL DATA:  83 year old female with a history of altered mental status EXAM: CT HEAD WITHOUT CONTRAST TECHNIQUE: Contiguous axial images were obtained from the base of the skull through the vertex without intravenous contrast. COMPARISON:  MRI 03/31/2017, CT 03/31/2017 FINDINGS: Brain: No acute intracranial hemorrhage. No midline shift or mass effect. Unremarkable configuration the ventricles. Gray-white differentiation maintained. Vascular: Atherosclerotic calcification of anterior and posterior circulation. Senescent calcifications of the basal ganglia Skull: No aggressive bony lesion. Sinuses/Orbits: No acute fracture. Other: None IMPRESSION: No CT evidence of acute intracranial abnormality. Electronically  Signed   By: Gilmer Mor D.O.   On: 04/28/2017 16:35   Mr Brain Wo Contrast  Result Date: 04/29/2017 CLINICAL DATA:  83 year old female with altered mental status. Chronic small vessel infarcts on June 2018 brain MRI. Atrial fibrillation. Confusion, headache. Worsening mental status. EXAM: MRI HEAD WITHOUT CONTRAST TECHNIQUE: Multiplanar, multiecho pulse sequences of the brain and surrounding structures were obtained without intravenous contrast.  COMPARISON:  Head CT 04/28/2017.  Brain MRI 03/31/2017. FINDINGS: Brain: Chronic lacunar infarcts in the bilateral thalami. Occasional tiny lacune in the cerebellum. Chronic T2 heterogeneity in the bilateral basal ganglia and cerebral white matter. No cortical encephalomalacia or chronic cerebral blood products identified. No convincing restricted diffusion to suggest acute infarction. No midline shift, mass effect, evidence of mass lesion, ventriculomegaly, extra-axial collection or acute intracranial hemorrhage. Cervicomedullary junction and pituitary are within normal limits. Vascular: Major intracranial vascular flow voids are stable. Skull and upper cervical spine: Chronic cervical spine degeneration, including degenerative spinal stenosis with spinal cord mass effect at C4-C5 and C5-C6 (series 6, images 12 and 13). Visualized bone marrow signal is within normal limits. Sinuses/Orbits: Stable an negative. Other: Visible internal auditory structures appear stable and within normal limits. Mastoids are clear. Visible scalp and face soft tissues appear normal. IMPRESSION: 1. Chronic small vessel ischemia with no acute infarct identified. 2. No acute intracranial abnormality. Stable noncontrast MRI appearance of the brain since June. 3. Chronic cervical spine degeneration and spinal stenosis with some spinal cord mass effect. Electronically Signed   By: Odessa Fleming M.D.   On: 04/29/2017 13:07    Microbiology: Recent Results (from the past 240 hour(s))  Urine Culture      Status: Abnormal   Collection Time: 04/28/17  4:07 PM  Result Value Ref Range Status   Specimen Description URINE, RANDOM  Final   Special Requests NONE  Final   Culture >=100,000 COLONIES/mL ESCHERICHIA COLI (A)  Final   Report Status 05/01/2017 FINAL  Final   Organism ID, Bacteria ESCHERICHIA COLI (A)  Final      Susceptibility   Escherichia coli - MIC*    AMPICILLIN <=2 SENSITIVE Sensitive     CEFAZOLIN <=4 SENSITIVE Sensitive     CEFTRIAXONE <=1 SENSITIVE Sensitive     CIPROFLOXACIN >=4 RESISTANT Resistant     GENTAMICIN <=1 SENSITIVE Sensitive     IMIPENEM <=0.25 SENSITIVE Sensitive     NITROFURANTOIN <=16 SENSITIVE Sensitive     TRIMETH/SULFA <=20 SENSITIVE Sensitive     AMPICILLIN/SULBACTAM <=2 SENSITIVE Sensitive     PIP/TAZO <=4 SENSITIVE Sensitive     Extended ESBL NEGATIVE Sensitive     * >=100,000 COLONIES/mL ESCHERICHIA COLI     Labs: Basic Metabolic Panel:  Recent Labs Lab 04/28/17 1545 04/29/17 0501 04/30/17 0254 05/01/17 1017 05/02/17 0159  NA 142 143 138 134* 134*  K 4.1 3.1* 3.7 5.9* 3.9  CL 105 106 103 101 100*  CO2 27 27 25  19* 26  GLUCOSE 171* 98 272* 237* 200*  BUN 36* 33* 30* 30* 31*  CREATININE 2.09* 1.80* 1.91* 2.32* 2.26*  CALCIUM 8.9 9.2 8.7* 8.4* 8.6*   Liver Function Tests:  Recent Labs Lab 04/28/17 1545  AST 82*  ALT 32  ALKPHOS 138*  BILITOT 1.2  PROT 6.9  ALBUMIN 2.7*   No results for input(s): LIPASE, AMYLASE in the last 168 hours. No results for input(s): AMMONIA in the last 168 hours. CBC:  Recent Labs Lab 04/28/17 1545 04/29/17 0501 04/30/17 0254 05/01/17 1017  WBC 8.2 8.2 9.7 9.4  NEUTROABS 5.5  --   --   --   HGB 10.7* 12.0 10.4* 10.2*  HCT 34.7* 39.9 34.6* 33.5*  MCV 91.8 92.4 92.3 92.0  PLT 81* 87* 99* 106*   Cardiac Enzymes: No results for input(s): CKTOTAL, CKMB, CKMBINDEX, TROPONINI in the last 168 hours. BNP: BNP (last 3 results)  Recent Labs  01/20/17 1155 03/17/17  1744 03/31/17 2000   BNP 197.7* 365.9* 398.4*    ProBNP (last 3 results)  Recent Labs  09/20/16 1458 03/16/17 1441 04/11/17 1345  PROBNP 140.0* 287.0* 215.0*    CBG:  Recent Labs Lab 05/01/17 0741 05/01/17 1218 05/01/17 1656 05/01/17 2343 05/02/17 0840  GLUCAP 184* 244* 247* 206* 180*

## 2017-05-02 NOTE — Progress Notes (Signed)
Per MD, patient wants to go home instead of CIR. CSW signing off.  Osborne Casco Boyde Grieco LCSWA [phone]

## 2017-05-02 NOTE — Progress Notes (Signed)
Occupational Therapy Treatment Patient Details Name: Danielle Benitez MRN: [id number] DOB: 07-Dec-1931 Today's Date: 05/02/2017    History of present illness 83 yo female admitted with R side weakness and AMS, (-) MRI for new stroke since having June 2018 event.  Acute metabolic encephalopathy, was originally not taken into CIR then.  PMHx:  CHF, HTN, anemia, bradycardia, pacer, a-fib, CKD 3, HOH, DM, gout,    OT comments  Pt much stronger than upon evaluation. Demonstrated ability to perform toileting, standing grooming, upper and lower body dressing and stand to weigh on scale with min to min guard assist. Daughter observed session and is comfortable caring for pt at home.   Follow Up Recommendations  No OT follow up;Supervision/Assistance - 24 hour    Equipment Recommendations       Recommendations for Other Services      Precautions / Restrictions Precautions Precautions: Fall Restrictions Weight Bearing Restrictions: No       Mobility Bed Mobility Overal bed mobility: Modified Independent             General bed mobility comments: no assist, increased time  Transfers Overall transfer level: Needs assistance Equipment used: Rolling walker (2 wheeled) Transfers: Sit to/from Stand Sit to Stand: Min guard         General transfer comment: sits impulsively, cues for hand placement and safety    Balance Overall balance assessment: Needs assistance   Sitting balance-Leahy Scale: Good Sitting balance - Comments: no LOB with donning socks     Standing balance-Leahy Scale: Poor Standing balance comment: requires B UE support                           ADL either performed or assessed with clinical judgement   ADL Overall ADL's : Needs assistance/impaired     Grooming: Brushing hair;Standing;Min guard           Upper Body Dressing : Set up;Sitting   Lower Body Dressing: Minimal assistance;Sit to/from stand Lower Body Dressing Details (indicate  cue type and reason): socks, with effort and bed in lowest positiong Toilet Transfer: Min guard;Ambulation;RW;BSC   Toileting- Clothing Manipulation and Hygiene: Minimal assistance;Sit to/from stand       Functional mobility during ADLs: Min guard;Rolling walker General ADL Comments: Pt has theraputty exercises at home, daughter will encourage and supervise ADL, plans to get a tub transfer bench and puzzles/tablet for pt      Vision       Perception     Praxis      Cognition Arousal/Alertness: Awake/alert Behavior During Therapy: WFL for tasks assessed/performed Overall Cognitive Status: History of cognitive impairments - at baseline                                 General Comments: has been home with daughter who works from home        Exercises     Shoulder Instructions       General Comments      Pertinent Vitals/ Pain       Pain Assessment: Faces Faces Pain Scale: Hurts little more Pain Location: knees Pain Descriptors / Indicators: Sore Pain Intervention(s): Monitored during session;Repositioned  Home Living  Prior Functioning/Environment              Frequency  Min 2X/week        Progress Toward Goals  OT Goals(current goals can now be found in the care plan section)  Progress towards OT goals: Progressing toward goals  Acute Rehab OT Goals Patient Stated Goal: go home OT Goal Formulation: With patient/family Time For Goal Achievement: 05/07/17 Potential to Achieve Goals: Good  Plan Discharge plan needs to be updated    Co-evaluation                 AM-PAC PT "6 Clicks" Daily Activity     Outcome Measure   Help from another person eating meals?: None Help from another person taking care of personal grooming?: A Little Help from another person toileting, which includes using toliet, bedpan, or urinal?: A Little Help from another person bathing (including  washing, rinsing, drying)?: A Little Help from another person to put on and taking off regular upper body clothing?: None Help from another person to put on and taking off regular lower body clothing?: A Little 6 Click Score: 20    End of Session    OT Visit Diagnosis: Unsteadiness on feet (R26.81);Muscle weakness (generalized) (M62.81);Other symptoms and signs involving cognitive function;Other abnormalities of gait and mobility (R26.89)   Activity Tolerance Patient tolerated treatment well   Patient Left in bed;with family/visitor present (with PT)   Nurse Communication          Time: 9833-8250 OT Time Calculation (min): 22 min  Charges: OT General Charges $OT Visit: 1 Procedure OT Treatments $Self Care/Home Management : 8-22 mins     Evern Bio 05/02/2017, 11:10 AM  [phone]

## 2017-05-03 DIAGNOSIS — M1990 Unspecified osteoarthritis, unspecified site: Secondary | ICD-10-CM | POA: Diagnosis not present

## 2017-05-03 DIAGNOSIS — I2510 Atherosclerotic heart disease of native coronary artery without angina pectoris: Secondary | ICD-10-CM | POA: Diagnosis not present

## 2017-05-03 DIAGNOSIS — Z9181 History of falling: Secondary | ICD-10-CM | POA: Diagnosis not present

## 2017-05-03 DIAGNOSIS — Z794 Long term (current) use of insulin: Secondary | ICD-10-CM | POA: Diagnosis not present

## 2017-05-03 DIAGNOSIS — I4891 Unspecified atrial fibrillation: Secondary | ICD-10-CM | POA: Diagnosis not present

## 2017-05-03 DIAGNOSIS — J449 Chronic obstructive pulmonary disease, unspecified: Secondary | ICD-10-CM | POA: Diagnosis not present

## 2017-05-03 DIAGNOSIS — E1122 Type 2 diabetes mellitus with diabetic chronic kidney disease: Secondary | ICD-10-CM | POA: Diagnosis not present

## 2017-05-03 DIAGNOSIS — N183 Chronic kidney disease, stage 3 (moderate): Secondary | ICD-10-CM | POA: Diagnosis not present

## 2017-05-03 DIAGNOSIS — Z950 Presence of cardiac pacemaker: Secondary | ICD-10-CM | POA: Diagnosis not present

## 2017-05-03 DIAGNOSIS — I130 Hypertensive heart and chronic kidney disease with heart failure and stage 1 through stage 4 chronic kidney disease, or unspecified chronic kidney disease: Secondary | ICD-10-CM | POA: Diagnosis not present

## 2017-05-03 DIAGNOSIS — I5022 Chronic systolic (congestive) heart failure: Secondary | ICD-10-CM | POA: Diagnosis not present

## 2017-05-03 DIAGNOSIS — E1142 Type 2 diabetes mellitus with diabetic polyneuropathy: Secondary | ICD-10-CM | POA: Diagnosis not present

## 2017-05-04 ENCOUNTER — Encounter: Payer: Self-pay | Admitting: Family Medicine

## 2017-05-04 ENCOUNTER — Ambulatory Visit (INDEPENDENT_AMBULATORY_CARE_PROVIDER_SITE_OTHER): Payer: Medicare Other | Admitting: Family Medicine

## 2017-05-04 VITALS — BP 100/60 | HR 70 | Temp 98.3°F | Wt 196.5 lb

## 2017-05-04 DIAGNOSIS — F329 Major depressive disorder, single episode, unspecified: Secondary | ICD-10-CM

## 2017-05-04 DIAGNOSIS — N390 Urinary tract infection, site not specified: Secondary | ICD-10-CM | POA: Diagnosis not present

## 2017-05-04 DIAGNOSIS — F32A Depression, unspecified: Secondary | ICD-10-CM

## 2017-05-04 DIAGNOSIS — K219 Gastro-esophageal reflux disease without esophagitis: Secondary | ICD-10-CM

## 2017-05-04 MED ORDER — PANTOPRAZOLE SODIUM 40 MG PO TBEC: DELAYED_RELEASE_TABLET | ORAL | Status: AC

## 2017-05-04 MED ORDER — INSULIN GLARGINE 100 UNIT/ML SOLOSTAR PEN: 25.0000 [IU] | PEN_INJECTOR | SUBCUTANEOUS | Status: DC

## 2017-05-04 MED ORDER — DULOXETINE HCL 20 MG PO CPEP: ORAL_CAPSULE | ORAL | Status: DC

## 2017-05-04 MED ORDER — APIXABAN 2.5 MG PO TABS: ORAL_TABLET | ORAL | Status: DC

## 2017-05-04 MED ORDER — AMIODARONE HCL 200 MG PO TABS: ORAL_TABLET | ORAL | Status: AC

## 2017-05-04 MED ORDER — GLIMEPIRIDE 1 MG PO TABS: ORAL_TABLET | ORAL | 1 refills | Status: AC

## 2017-05-04 MED ORDER — FUROSEMIDE 40 MG PO TABS: ORAL_TABLET | ORAL | 0 refills | Status: DC

## 2017-05-04 MED ORDER — SAXAGLIPTIN HCL 2.5 MG PO TABS: ORAL_TABLET | ORAL | Status: DC

## 2017-05-04 MED ORDER — ALLOPURINOL 300 MG PO TABS: ORAL_TABLET | ORAL | 1 refills | Status: DC

## 2017-05-04 NOTE — Progress Notes (Signed)
Admit date: 04/28/2017 Discharge date: 05/02/2017  Recommendations for Outpatient Follow-up:  Please continue keflex for 7 days on discharge. Since patient is on lasix please continue to monitor renal function in outpatient setting. Creatinine is 2.2 (this is her baseline value).  Discharge Diagnoses:  Principal Problem:   Acute metabolic encephalopathy Active Problems:   Atrial fibrillation - 07/11/15 by PPM interrogation, recurrent 10/19 after DCCV 10/14   Anticoagulated- Eliquis   Cardiac pacemaker in situ   Acute lower UTI   Diabetes mellitus with diabetic nephropathy (HCC)   Dyslipidemia associated with type 2 diabetes mellitus (HCC)   Chronic diastolic CHF (congestive heart failure) (HCC)   UTI (urinary tract infection)    Discharge Condition: stable   Diet recommendation: as tolerated   History of present illness:  83 y.o.femalewith medical history significant for stroke (MRI brain in 03/2017 showed chronic basal ganglia and thalamus infarcts), atrial fibrillation on apixaban, dyslipidemia, diabetes, depression. Pt presented to ED and per family report she apparently was more confused since the morning of the admission, less interactive.  In ED, pt was hemodynamically stable. Blood work was notable for hgb of 10.7, platelets 81, creatinine 2.09 (creatinine 3 weeks ago was 2.22), lactic acid 2.09. CT head showed no acute findings. UA showed trace leukocytes and she was started on IV rocephin.   Hospital Course:   Principal Problem: Acute metabolic encephalopathy - Has had recent MRI in 03/2017 which showed chronic basal ganglia and thalamus infarcts - MRI brain - no acute stroke  - Continue apixaban - ECHO showed normal EF - Carotid doppler showed no significant ICA stenosis - A1c is 8.3 - LDL is 47, pt on Lipitor - PT recommended CIR but pt declined it, pt looks much better this am Other Stroke Risk Factors : Advanced age, history of CVA   Active  Problems: Atrial fibrillation - 07/11/15 by PPM interrogation, recurrent 10/19 after DCCV 10/14 - CHADS vas score 7 - Continue apixaban  - Continue amiodarone   Acute lower UTI secondary to E.Coli  - Continue rocephin  - Resistant to Cipro so will use keflex for 7 days   Diabetes mellitus with diabetic nephropathy (HCC) - Continue insulin per home regimen   Dyslipidemia associated with type 2 diabetes mellitus (HCC) - Continue Lipitor   Chronic diastolic CHF (congestive heart failure) (HCC) - Compensated - Continue lasix but continue to monitor renal function   Depression - Continue Cymbalta   CKD stage 4 - Cr improving since admission  - Cr within baseline range - Baseline creatinine in 07/2015 was 3.41  Thrombocytopenia - Likely from [facility] with apixaban - Stable   Anemia of chronic kidney disease - hgb stable   DVT prophylaxis:On apixaban Code Status:full code  Family Communication:daughter at bedside, updated and pt wants to go home instead of inpatient rehab   ===============================================  She doesn't recall a lot of the recent hospitalization.  Admitted with UTI, d/w pt about hospital course.  She is more deconditioned in the meantime.  Her confusion continues but is some better in the meantime.  Some of this confusion was expected. Her weight is up in the meantime, still with BLE edema, though her weight is slowly trending back to her previous baseline set she has been discharged from the hospital.  She has PT OT and RN and aide at home.  She has trouble with bathing, mobility, etc.  Currently taking 80mg  lasix in the AM and 40 in the afternoon.    PMH and  SH reviewed  ROS: Per HPI unless specifically indicated in ROS section   Meds, vitals, and allergies reviewed.   GEN: nad, alert and pleasant but she does not recall a lot of the details about the previous hospitalization HEENT: mucous membranes moist, her face  looks slightly puffy (family has noted that when she has increase in weight or fluid retention she tends to have slight puffiness in the face noted) NECK: supple w/o LA CV: rrr PULM: ctab, no inc wob ABD: soft, +bs EXT: 1+ BLE edema SKIN: no acute rash

## 2017-05-04 NOTE — Patient Instructions (Signed)
Don't change your meds for now.   Go to the lab on the way out.  We'll contact you with your lab report. We'll be in touch.  I think your swelling will gradually go down some even without changing your lasix.  Take care.  Glad to see you.

## 2017-05-05 ENCOUNTER — Telehealth (HOSPITAL_COMMUNITY): Payer: Self-pay

## 2017-05-05 DIAGNOSIS — I130 Hypertensive heart and chronic kidney disease with heart failure and stage 1 through stage 4 chronic kidney disease, or unspecified chronic kidney disease: Secondary | ICD-10-CM | POA: Diagnosis not present

## 2017-05-05 DIAGNOSIS — J449 Chronic obstructive pulmonary disease, unspecified: Secondary | ICD-10-CM | POA: Diagnosis not present

## 2017-05-05 DIAGNOSIS — Z794 Long term (current) use of insulin: Secondary | ICD-10-CM | POA: Diagnosis not present

## 2017-05-05 DIAGNOSIS — E1142 Type 2 diabetes mellitus with diabetic polyneuropathy: Secondary | ICD-10-CM | POA: Diagnosis not present

## 2017-05-05 DIAGNOSIS — I4891 Unspecified atrial fibrillation: Secondary | ICD-10-CM | POA: Diagnosis not present

## 2017-05-05 DIAGNOSIS — N183 Chronic kidney disease, stage 3 (moderate): Secondary | ICD-10-CM | POA: Diagnosis not present

## 2017-05-05 DIAGNOSIS — Z9181 History of falling: Secondary | ICD-10-CM | POA: Diagnosis not present

## 2017-05-05 DIAGNOSIS — I5022 Chronic systolic (congestive) heart failure: Secondary | ICD-10-CM | POA: Diagnosis not present

## 2017-05-05 DIAGNOSIS — M1990 Unspecified osteoarthritis, unspecified site: Secondary | ICD-10-CM | POA: Diagnosis not present

## 2017-05-05 DIAGNOSIS — Z950 Presence of cardiac pacemaker: Secondary | ICD-10-CM | POA: Diagnosis not present

## 2017-05-05 DIAGNOSIS — I2510 Atherosclerotic heart disease of native coronary artery without angina pectoris: Secondary | ICD-10-CM | POA: Diagnosis not present

## 2017-05-05 DIAGNOSIS — E1122 Type 2 diabetes mellitus with diabetic chronic kidney disease: Secondary | ICD-10-CM | POA: Diagnosis not present

## 2017-05-05 LAB — CBC WITH DIFFERENTIAL/PLATELET
BASOS PCT: 1.2 % (ref 0.0–3.0)
Basophils Absolute: 0.1 10*3/uL (ref 0.0–0.1)
EOS PCT: 2 % (ref 0.0–5.0)
Eosinophils Absolute: 0.2 10*3/uL (ref 0.0–0.7)
HEMATOCRIT: 34.1 % — AB (ref 36.0–46.0)
HEMOGLOBIN: 10.7 g/dL — AB (ref 12.0–15.0)
LYMPHS PCT: 19.4 % (ref 12.0–46.0)
Lymphs Abs: 1.5 10*3/uL (ref 0.7–4.0)
MCHC: 31.5 g/dL (ref 30.0–36.0)
MCV: 95.2 fl (ref 78.0–100.0)
MONO ABS: 0.8 10*3/uL (ref 0.1–1.0)
Monocytes Relative: 10.1 % (ref 3.0–12.0)
Neutro Abs: 5.3 10*3/uL (ref 1.4–7.7)
Neutrophils Relative %: 67.3 % (ref 43.0–77.0)
Platelets: 117 10*3/uL — ABNORMAL LOW (ref 150.0–400.0)
RBC: 3.58 Mil/uL — AB (ref 3.87–5.11)
RDW: 19.9 % — AB (ref 11.5–15.5)
WBC: 7.8 10*3/uL (ref 4.0–10.5)

## 2017-05-05 LAB — BASIC METABOLIC PANEL
BUN: 42 mg/dL — AB (ref 6–23)
CO2: 29 meq/L (ref 19–32)
Calcium: 9.3 mg/dL (ref 8.4–10.5)
Chloride: 100 mEq/L (ref 96–112)
Creatinine, Ser: 2.56 mg/dL — ABNORMAL HIGH (ref 0.40–1.20)
GFR: 19 mL/min — AB (ref >60.00)
GLUCOSE: 193 mg/dL — AB (ref 70–99)
Potassium: 3.8 mEq/L (ref 3.5–5.1)
Sodium: 136 mEq/L (ref 135–145)

## 2017-05-05 NOTE — Assessment & Plan Note (Signed)
With subsequent worsening confusion, deconditioning, initially requiring sig IV fluids as inpatient. Now discharged from hospital and fluid status appears to be slowly normalizing. Lungs are clear. Okay for outpatient follow-up at this point. No change in medications at this point but recheck CBC and creatinine today. No change in Lasix yet, we will see how her weight does the next few days and make considerations when we see her labs. Rationale discussed with patient and daughter. She has home health services as described above. Continue as is. >25 minutes spent in face to face time with patient, >50% spent in counselling or coordination of care.

## 2017-05-05 NOTE — Telephone Encounter (Signed)
Patient's daughter called to report 4 lb weight gain since recent hosp DC but states her swelling has diminished and thinks this may be a bowel/constipation issue as she has not gone in several days. Patient has taken senakot x 2 days with minimal result. Advised may try 1 suppository to see if this helps as she reports feeling pressure in her bottom "like its stuck". Advised if suppository does not help to call PCP to further advise.  Ave Filter, RN

## 2017-05-06 ENCOUNTER — Other Ambulatory Visit: Payer: Self-pay | Admitting: Family Medicine

## 2017-05-06 ENCOUNTER — Telehealth: Payer: Self-pay

## 2017-05-06 DIAGNOSIS — N183 Chronic kidney disease, stage 3 (moderate): Secondary | ICD-10-CM | POA: Diagnosis not present

## 2017-05-06 DIAGNOSIS — I2510 Atherosclerotic heart disease of native coronary artery without angina pectoris: Secondary | ICD-10-CM | POA: Diagnosis not present

## 2017-05-06 DIAGNOSIS — I5022 Chronic systolic (congestive) heart failure: Secondary | ICD-10-CM | POA: Diagnosis not present

## 2017-05-06 DIAGNOSIS — E1142 Type 2 diabetes mellitus with diabetic polyneuropathy: Secondary | ICD-10-CM | POA: Diagnosis not present

## 2017-05-06 DIAGNOSIS — I4891 Unspecified atrial fibrillation: Secondary | ICD-10-CM | POA: Diagnosis not present

## 2017-05-06 DIAGNOSIS — R7989 Other specified abnormal findings of blood chemistry: Secondary | ICD-10-CM

## 2017-05-06 DIAGNOSIS — J449 Chronic obstructive pulmonary disease, unspecified: Secondary | ICD-10-CM | POA: Diagnosis not present

## 2017-05-06 DIAGNOSIS — E1122 Type 2 diabetes mellitus with diabetic chronic kidney disease: Secondary | ICD-10-CM | POA: Diagnosis not present

## 2017-05-06 DIAGNOSIS — Z794 Long term (current) use of insulin: Secondary | ICD-10-CM | POA: Diagnosis not present

## 2017-05-06 DIAGNOSIS — Z9181 History of falling: Secondary | ICD-10-CM | POA: Diagnosis not present

## 2017-05-06 DIAGNOSIS — Z950 Presence of cardiac pacemaker: Secondary | ICD-10-CM | POA: Diagnosis not present

## 2017-05-06 DIAGNOSIS — I130 Hypertensive heart and chronic kidney disease with heart failure and stage 1 through stage 4 chronic kidney disease, or unspecified chronic kidney disease: Secondary | ICD-10-CM | POA: Diagnosis not present

## 2017-05-06 DIAGNOSIS — M1990 Unspecified osteoarthritis, unspecified site: Secondary | ICD-10-CM | POA: Diagnosis not present

## 2017-05-06 MED ORDER — FUROSEMIDE 40 MG PO TABS: ORAL_TABLET | ORAL | Status: DC

## 2017-05-06 NOTE — Telephone Encounter (Signed)
Danielle Benitez with [facility] left v/m requesting verbal orders for HH PT 2 x a week for 6 weeks. 

## 2017-05-07 ENCOUNTER — Telehealth: Payer: Self-pay | Admitting: Physician Assistant

## 2017-05-07 NOTE — Telephone Encounter (Signed)
Patient of heart failure service, daughter called with 7.5 lbs weight gain over night. She initially did not believe this number and rechecked it again, it was accurate. Daughter says her breathing is still ok, but noticeably more swelling. Recently patient's renal function worsened slightly with Cr up to 2.5, her primary care service instructed her to hold PM dose of lasix which was going to start today, I instructed her not to hold PM dose of lasix, she will need to be seen within a week by HF service given significant weight gain and conflicting clinical picture between her lab and appearance.   It is possible for Cr to worsen in the setting of volume overload. Again given conflicting picture, I am hesitant for her to use metolazone. She will need to be seen early. I will send staff message to HF scheduler  Signed, Azalee Course PA Pager: [phone]

## 2017-05-08 NOTE — Telephone Encounter (Signed)
Please give the order.  Thanks.   

## 2017-05-09 ENCOUNTER — Other Ambulatory Visit (HOSPITAL_COMMUNITY): Payer: Self-pay | Admitting: Cardiology

## 2017-05-09 NOTE — Telephone Encounter (Signed)
Danielle Benitez with [facility] advised.

## 2017-05-10 ENCOUNTER — Encounter: Payer: Self-pay | Admitting: *Deleted

## 2017-05-10 ENCOUNTER — Telehealth: Payer: Self-pay

## 2017-05-10 DIAGNOSIS — I2510 Atherosclerotic heart disease of native coronary artery without angina pectoris: Secondary | ICD-10-CM | POA: Diagnosis not present

## 2017-05-10 DIAGNOSIS — Z794 Long term (current) use of insulin: Secondary | ICD-10-CM | POA: Diagnosis not present

## 2017-05-10 DIAGNOSIS — N183 Chronic kidney disease, stage 3 (moderate): Secondary | ICD-10-CM | POA: Diagnosis not present

## 2017-05-10 DIAGNOSIS — I130 Hypertensive heart and chronic kidney disease with heart failure and stage 1 through stage 4 chronic kidney disease, or unspecified chronic kidney disease: Secondary | ICD-10-CM | POA: Diagnosis not present

## 2017-05-10 DIAGNOSIS — E1122 Type 2 diabetes mellitus with diabetic chronic kidney disease: Secondary | ICD-10-CM | POA: Diagnosis not present

## 2017-05-10 DIAGNOSIS — M1990 Unspecified osteoarthritis, unspecified site: Secondary | ICD-10-CM | POA: Diagnosis not present

## 2017-05-10 DIAGNOSIS — J449 Chronic obstructive pulmonary disease, unspecified: Secondary | ICD-10-CM | POA: Diagnosis not present

## 2017-05-10 DIAGNOSIS — Z9181 History of falling: Secondary | ICD-10-CM | POA: Diagnosis not present

## 2017-05-10 DIAGNOSIS — I5022 Chronic systolic (congestive) heart failure: Secondary | ICD-10-CM | POA: Diagnosis not present

## 2017-05-10 DIAGNOSIS — Z950 Presence of cardiac pacemaker: Secondary | ICD-10-CM | POA: Diagnosis not present

## 2017-05-10 DIAGNOSIS — I4891 Unspecified atrial fibrillation: Secondary | ICD-10-CM | POA: Diagnosis not present

## 2017-05-10 DIAGNOSIS — E1142 Type 2 diabetes mellitus with diabetic polyneuropathy: Secondary | ICD-10-CM | POA: Diagnosis not present

## 2017-05-10 NOTE — Telephone Encounter (Signed)
Please send order for u/a.  Dx: N39.0 Okay to use OTC monistat in the meantime.   Thanks.

## 2017-05-10 NOTE — Telephone Encounter (Signed)
Would give afternoon dose only if AM weight is above 196 lbs.  Continue with AM dose as is. Will await f/u labs.   Thanks.

## 2017-05-10 NOTE — Telephone Encounter (Signed)
[name] said that pts kidney function was up and pt was advised to withhold afternoon lasix for one week and on 05/07/17 wt went from 196.4 - 203.8 lbs. [name] called cardiology and they said to take the afternoon lasix on Sat and Sun and cb to Dr Para March if wt did not come down. 06/08/17 wt was 196 lbs. 06/09/17 wt 195.4 lbs (withheld afternoon lasix). 06/10/17 wt is 196.2 lbs. [name] wants to know if should continue holding afternoon lasix or give it in afternoon. Please advise.

## 2017-05-10 NOTE — Telephone Encounter (Signed)
Left detailed message on voicemail.  

## 2017-05-10 NOTE — Telephone Encounter (Signed)
Dawn nurse with [facility] request order to repeat urinalysis to verify UTI cleared: pt finished abx on 05/08/17; pt complains with perineal burning but Dawn wonders if could be yeast infection after abx.Dawn request U/A befoe treating a yeast infection. Dawn request cb.

## 2017-05-10 NOTE — Telephone Encounter (Signed)
Dawn with [facility] advised.  Order faxed to [phone].  Dawn says she will let the patient know to use OTC Monistat in the meantime.

## 2017-05-11 ENCOUNTER — Telehealth: Payer: Self-pay

## 2017-05-11 NOTE — Telephone Encounter (Signed)
Danielle Benitez with Danielle Benitez left v/m; Danielle Benitez received a fax for order for urinalysis; Danielle Benitez wants to know if should also do culture. Danielle Benitez request cb with verbal order if C/S needs to be done.

## 2017-05-11 NOTE — Telephone Encounter (Signed)
Yes, please culture.

## 2017-05-12 DIAGNOSIS — I2510 Atherosclerotic heart disease of native coronary artery without angina pectoris: Secondary | ICD-10-CM | POA: Diagnosis not present

## 2017-05-12 DIAGNOSIS — Z794 Long term (current) use of insulin: Secondary | ICD-10-CM | POA: Diagnosis not present

## 2017-05-12 DIAGNOSIS — J449 Chronic obstructive pulmonary disease, unspecified: Secondary | ICD-10-CM | POA: Diagnosis not present

## 2017-05-12 DIAGNOSIS — E1142 Type 2 diabetes mellitus with diabetic polyneuropathy: Secondary | ICD-10-CM | POA: Diagnosis not present

## 2017-05-12 DIAGNOSIS — N390 Urinary tract infection, site not specified: Secondary | ICD-10-CM | POA: Diagnosis not present

## 2017-05-12 DIAGNOSIS — I4891 Unspecified atrial fibrillation: Secondary | ICD-10-CM | POA: Diagnosis not present

## 2017-05-12 DIAGNOSIS — M1990 Unspecified osteoarthritis, unspecified site: Secondary | ICD-10-CM | POA: Diagnosis not present

## 2017-05-12 DIAGNOSIS — I5022 Chronic systolic (congestive) heart failure: Secondary | ICD-10-CM | POA: Diagnosis not present

## 2017-05-12 DIAGNOSIS — E1122 Type 2 diabetes mellitus with diabetic chronic kidney disease: Secondary | ICD-10-CM | POA: Diagnosis not present

## 2017-05-12 DIAGNOSIS — N183 Chronic kidney disease, stage 3 (moderate): Secondary | ICD-10-CM | POA: Diagnosis not present

## 2017-05-12 DIAGNOSIS — I130 Hypertensive heart and chronic kidney disease with heart failure and stage 1 through stage 4 chronic kidney disease, or unspecified chronic kidney disease: Secondary | ICD-10-CM | POA: Diagnosis not present

## 2017-05-12 DIAGNOSIS — Z9181 History of falling: Secondary | ICD-10-CM | POA: Diagnosis not present

## 2017-05-12 DIAGNOSIS — Z950 Presence of cardiac pacemaker: Secondary | ICD-10-CM | POA: Diagnosis not present

## 2017-05-12 NOTE — Telephone Encounter (Signed)
Erie Noe with [facility] advised.

## 2017-05-13 ENCOUNTER — Encounter: Payer: Self-pay | Admitting: Family Medicine

## 2017-05-13 ENCOUNTER — Other Ambulatory Visit: Payer: Medicare Other

## 2017-05-13 ENCOUNTER — Other Ambulatory Visit (HOSPITAL_COMMUNITY): Payer: Self-pay | Admitting: *Deleted

## 2017-05-13 ENCOUNTER — Other Ambulatory Visit (INDEPENDENT_AMBULATORY_CARE_PROVIDER_SITE_OTHER): Payer: Medicare Other

## 2017-05-13 DIAGNOSIS — R7989 Other specified abnormal findings of blood chemistry: Secondary | ICD-10-CM

## 2017-05-13 DIAGNOSIS — I5022 Chronic systolic (congestive) heart failure: Secondary | ICD-10-CM | POA: Diagnosis not present

## 2017-05-13 DIAGNOSIS — I4891 Unspecified atrial fibrillation: Secondary | ICD-10-CM | POA: Diagnosis not present

## 2017-05-13 DIAGNOSIS — Z794 Long term (current) use of insulin: Secondary | ICD-10-CM | POA: Diagnosis not present

## 2017-05-13 DIAGNOSIS — I130 Hypertensive heart and chronic kidney disease with heart failure and stage 1 through stage 4 chronic kidney disease, or unspecified chronic kidney disease: Secondary | ICD-10-CM | POA: Diagnosis not present

## 2017-05-13 DIAGNOSIS — Z9181 History of falling: Secondary | ICD-10-CM | POA: Diagnosis not present

## 2017-05-13 DIAGNOSIS — N183 Chronic kidney disease, stage 3 (moderate): Secondary | ICD-10-CM | POA: Diagnosis not present

## 2017-05-13 DIAGNOSIS — J449 Chronic obstructive pulmonary disease, unspecified: Secondary | ICD-10-CM | POA: Diagnosis not present

## 2017-05-13 DIAGNOSIS — E1142 Type 2 diabetes mellitus with diabetic polyneuropathy: Secondary | ICD-10-CM | POA: Diagnosis not present

## 2017-05-13 DIAGNOSIS — I2510 Atherosclerotic heart disease of native coronary artery without angina pectoris: Secondary | ICD-10-CM | POA: Diagnosis not present

## 2017-05-13 DIAGNOSIS — Z950 Presence of cardiac pacemaker: Secondary | ICD-10-CM | POA: Diagnosis not present

## 2017-05-13 DIAGNOSIS — E1122 Type 2 diabetes mellitus with diabetic chronic kidney disease: Secondary | ICD-10-CM | POA: Diagnosis not present

## 2017-05-13 DIAGNOSIS — M1990 Unspecified osteoarthritis, unspecified site: Secondary | ICD-10-CM | POA: Diagnosis not present

## 2017-05-13 LAB — BASIC METABOLIC PANEL
BUN: 27 mg/dL — AB (ref 6–23)
CALCIUM: 8.4 mg/dL (ref 8.4–10.5)
CO2: 31 meq/L (ref 19–32)
Chloride: 103 mEq/L (ref 96–112)
Creatinine, Ser: 1.91 mg/dL — ABNORMAL HIGH (ref 0.40–1.20)
GFR: 26.64 mL/min — AB (ref >60.00)
GLUCOSE: 165 mg/dL — AB (ref 70–99)
Potassium: 3.6 mEq/L (ref 3.5–5.1)
Sodium: 140 mEq/L (ref 135–145)

## 2017-05-13 MED ORDER — FUROSEMIDE 40 MG PO TABS: ORAL_TABLET | ORAL | 3 refills | Status: DC

## 2017-05-13 NOTE — Telephone Encounter (Signed)
[facility] called about a medication discrepancy.  Patient stated she is suppose to be taking lasix 80 mg in the AM and 40 mg in the PM.  However prescription sent to pharmacy as 40 mg BID.  Looking at previous notes patient is suppose to be taking 80 mg in the AM and 40 mg in the pm. New prescription sent to pharmacy.

## 2017-05-16 DIAGNOSIS — E1142 Type 2 diabetes mellitus with diabetic polyneuropathy: Secondary | ICD-10-CM | POA: Diagnosis not present

## 2017-05-16 DIAGNOSIS — I5022 Chronic systolic (congestive) heart failure: Secondary | ICD-10-CM | POA: Diagnosis not present

## 2017-05-16 DIAGNOSIS — M1990 Unspecified osteoarthritis, unspecified site: Secondary | ICD-10-CM | POA: Diagnosis not present

## 2017-05-16 DIAGNOSIS — Z9181 History of falling: Secondary | ICD-10-CM | POA: Diagnosis not present

## 2017-05-16 DIAGNOSIS — N183 Chronic kidney disease, stage 3 (moderate): Secondary | ICD-10-CM | POA: Diagnosis not present

## 2017-05-16 DIAGNOSIS — Z794 Long term (current) use of insulin: Secondary | ICD-10-CM | POA: Diagnosis not present

## 2017-05-16 DIAGNOSIS — I130 Hypertensive heart and chronic kidney disease with heart failure and stage 1 through stage 4 chronic kidney disease, or unspecified chronic kidney disease: Secondary | ICD-10-CM | POA: Diagnosis not present

## 2017-05-16 DIAGNOSIS — Z950 Presence of cardiac pacemaker: Secondary | ICD-10-CM | POA: Diagnosis not present

## 2017-05-16 DIAGNOSIS — I2510 Atherosclerotic heart disease of native coronary artery without angina pectoris: Secondary | ICD-10-CM | POA: Diagnosis not present

## 2017-05-16 DIAGNOSIS — J449 Chronic obstructive pulmonary disease, unspecified: Secondary | ICD-10-CM | POA: Diagnosis not present

## 2017-05-16 DIAGNOSIS — I4891 Unspecified atrial fibrillation: Secondary | ICD-10-CM | POA: Diagnosis not present

## 2017-05-16 DIAGNOSIS — E1122 Type 2 diabetes mellitus with diabetic chronic kidney disease: Secondary | ICD-10-CM | POA: Diagnosis not present

## 2017-05-16 NOTE — Telephone Encounter (Signed)
I was out of office from 7/27-05/16/17, spoke w/Dr Shirlee Latch regarding this message, he would like pt seen this week to f/u.  Attempted to call pt and Left message to call back

## 2017-05-16 NOTE — Telephone Encounter (Signed)
Left message to call back  

## 2017-05-17 DIAGNOSIS — I130 Hypertensive heart and chronic kidney disease with heart failure and stage 1 through stage 4 chronic kidney disease, or unspecified chronic kidney disease: Secondary | ICD-10-CM | POA: Diagnosis not present

## 2017-05-17 DIAGNOSIS — M1990 Unspecified osteoarthritis, unspecified site: Secondary | ICD-10-CM | POA: Diagnosis not present

## 2017-05-17 DIAGNOSIS — J449 Chronic obstructive pulmonary disease, unspecified: Secondary | ICD-10-CM | POA: Diagnosis not present

## 2017-05-17 DIAGNOSIS — N183 Chronic kidney disease, stage 3 (moderate): Secondary | ICD-10-CM | POA: Diagnosis not present

## 2017-05-17 DIAGNOSIS — I4891 Unspecified atrial fibrillation: Secondary | ICD-10-CM | POA: Diagnosis not present

## 2017-05-17 DIAGNOSIS — Z794 Long term (current) use of insulin: Secondary | ICD-10-CM | POA: Diagnosis not present

## 2017-05-17 DIAGNOSIS — I2510 Atherosclerotic heart disease of native coronary artery without angina pectoris: Secondary | ICD-10-CM | POA: Diagnosis not present

## 2017-05-17 DIAGNOSIS — Z950 Presence of cardiac pacemaker: Secondary | ICD-10-CM | POA: Diagnosis not present

## 2017-05-17 DIAGNOSIS — E1122 Type 2 diabetes mellitus with diabetic chronic kidney disease: Secondary | ICD-10-CM | POA: Diagnosis not present

## 2017-05-17 DIAGNOSIS — Z9181 History of falling: Secondary | ICD-10-CM | POA: Diagnosis not present

## 2017-05-17 DIAGNOSIS — E1142 Type 2 diabetes mellitus with diabetic polyneuropathy: Secondary | ICD-10-CM | POA: Diagnosis not present

## 2017-05-17 DIAGNOSIS — I5022 Chronic systolic (congestive) heart failure: Secondary | ICD-10-CM | POA: Diagnosis not present

## 2017-05-18 DIAGNOSIS — M1990 Unspecified osteoarthritis, unspecified site: Secondary | ICD-10-CM | POA: Diagnosis not present

## 2017-05-18 DIAGNOSIS — Z9181 History of falling: Secondary | ICD-10-CM | POA: Diagnosis not present

## 2017-05-18 DIAGNOSIS — E1122 Type 2 diabetes mellitus with diabetic chronic kidney disease: Secondary | ICD-10-CM | POA: Diagnosis not present

## 2017-05-18 DIAGNOSIS — I2510 Atherosclerotic heart disease of native coronary artery without angina pectoris: Secondary | ICD-10-CM | POA: Diagnosis not present

## 2017-05-18 DIAGNOSIS — E1142 Type 2 diabetes mellitus with diabetic polyneuropathy: Secondary | ICD-10-CM | POA: Diagnosis not present

## 2017-05-18 DIAGNOSIS — I4891 Unspecified atrial fibrillation: Secondary | ICD-10-CM | POA: Diagnosis not present

## 2017-05-18 DIAGNOSIS — N183 Chronic kidney disease, stage 3 (moderate): Secondary | ICD-10-CM | POA: Diagnosis not present

## 2017-05-18 DIAGNOSIS — I5022 Chronic systolic (congestive) heart failure: Secondary | ICD-10-CM | POA: Diagnosis not present

## 2017-05-18 DIAGNOSIS — J449 Chronic obstructive pulmonary disease, unspecified: Secondary | ICD-10-CM | POA: Diagnosis not present

## 2017-05-18 DIAGNOSIS — I130 Hypertensive heart and chronic kidney disease with heart failure and stage 1 through stage 4 chronic kidney disease, or unspecified chronic kidney disease: Secondary | ICD-10-CM | POA: Diagnosis not present

## 2017-05-18 DIAGNOSIS — Z950 Presence of cardiac pacemaker: Secondary | ICD-10-CM | POA: Diagnosis not present

## 2017-05-18 DIAGNOSIS — Z794 Long term (current) use of insulin: Secondary | ICD-10-CM | POA: Diagnosis not present

## 2017-05-19 ENCOUNTER — Telehealth: Payer: Self-pay | Admitting: Cardiology

## 2017-05-19 ENCOUNTER — Encounter: Payer: Medicare Other | Admitting: *Deleted

## 2017-05-19 DIAGNOSIS — Z794 Long term (current) use of insulin: Secondary | ICD-10-CM | POA: Diagnosis not present

## 2017-05-19 DIAGNOSIS — I130 Hypertensive heart and chronic kidney disease with heart failure and stage 1 through stage 4 chronic kidney disease, or unspecified chronic kidney disease: Secondary | ICD-10-CM | POA: Diagnosis not present

## 2017-05-19 DIAGNOSIS — I2510 Atherosclerotic heart disease of native coronary artery without angina pectoris: Secondary | ICD-10-CM | POA: Diagnosis not present

## 2017-05-19 DIAGNOSIS — M1990 Unspecified osteoarthritis, unspecified site: Secondary | ICD-10-CM | POA: Diagnosis not present

## 2017-05-19 DIAGNOSIS — Z9181 History of falling: Secondary | ICD-10-CM | POA: Diagnosis not present

## 2017-05-19 DIAGNOSIS — Z950 Presence of cardiac pacemaker: Secondary | ICD-10-CM | POA: Diagnosis not present

## 2017-05-19 DIAGNOSIS — E1122 Type 2 diabetes mellitus with diabetic chronic kidney disease: Secondary | ICD-10-CM | POA: Diagnosis not present

## 2017-05-19 DIAGNOSIS — E1142 Type 2 diabetes mellitus with diabetic polyneuropathy: Secondary | ICD-10-CM | POA: Diagnosis not present

## 2017-05-19 DIAGNOSIS — I4891 Unspecified atrial fibrillation: Secondary | ICD-10-CM | POA: Diagnosis not present

## 2017-05-19 DIAGNOSIS — J449 Chronic obstructive pulmonary disease, unspecified: Secondary | ICD-10-CM | POA: Diagnosis not present

## 2017-05-19 DIAGNOSIS — I5022 Chronic systolic (congestive) heart failure: Secondary | ICD-10-CM | POA: Diagnosis not present

## 2017-05-19 DIAGNOSIS — N183 Chronic kidney disease, stage 3 (moderate): Secondary | ICD-10-CM | POA: Diagnosis not present

## 2017-05-19 NOTE — Telephone Encounter (Signed)
Confirmed remote transmission w/ pt daughter.   

## 2017-05-20 DIAGNOSIS — E1142 Type 2 diabetes mellitus with diabetic polyneuropathy: Secondary | ICD-10-CM | POA: Diagnosis not present

## 2017-05-20 DIAGNOSIS — I2510 Atherosclerotic heart disease of native coronary artery without angina pectoris: Secondary | ICD-10-CM | POA: Diagnosis not present

## 2017-05-20 DIAGNOSIS — Z9181 History of falling: Secondary | ICD-10-CM | POA: Diagnosis not present

## 2017-05-20 DIAGNOSIS — N183 Chronic kidney disease, stage 3 (moderate): Secondary | ICD-10-CM | POA: Diagnosis not present

## 2017-05-20 DIAGNOSIS — N399 Disorder of urinary system, unspecified: Secondary | ICD-10-CM | POA: Diagnosis not present

## 2017-05-20 DIAGNOSIS — Z950 Presence of cardiac pacemaker: Secondary | ICD-10-CM | POA: Diagnosis not present

## 2017-05-20 DIAGNOSIS — E1122 Type 2 diabetes mellitus with diabetic chronic kidney disease: Secondary | ICD-10-CM | POA: Diagnosis not present

## 2017-05-20 DIAGNOSIS — J449 Chronic obstructive pulmonary disease, unspecified: Secondary | ICD-10-CM | POA: Diagnosis not present

## 2017-05-20 DIAGNOSIS — I130 Hypertensive heart and chronic kidney disease with heart failure and stage 1 through stage 4 chronic kidney disease, or unspecified chronic kidney disease: Secondary | ICD-10-CM | POA: Diagnosis not present

## 2017-05-20 DIAGNOSIS — Z794 Long term (current) use of insulin: Secondary | ICD-10-CM | POA: Diagnosis not present

## 2017-05-20 DIAGNOSIS — I5022 Chronic systolic (congestive) heart failure: Secondary | ICD-10-CM | POA: Diagnosis not present

## 2017-05-20 DIAGNOSIS — M1990 Unspecified osteoarthritis, unspecified site: Secondary | ICD-10-CM | POA: Diagnosis not present

## 2017-05-20 DIAGNOSIS — I4891 Unspecified atrial fibrillation: Secondary | ICD-10-CM | POA: Diagnosis not present

## 2017-05-24 ENCOUNTER — Encounter: Payer: Self-pay | Admitting: Family Medicine

## 2017-05-24 ENCOUNTER — Other Ambulatory Visit: Payer: Self-pay

## 2017-05-24 DIAGNOSIS — E1142 Type 2 diabetes mellitus with diabetic polyneuropathy: Secondary | ICD-10-CM | POA: Diagnosis not present

## 2017-05-24 DIAGNOSIS — N183 Chronic kidney disease, stage 3 (moderate): Secondary | ICD-10-CM | POA: Diagnosis not present

## 2017-05-24 DIAGNOSIS — Z794 Long term (current) use of insulin: Secondary | ICD-10-CM | POA: Diagnosis not present

## 2017-05-24 DIAGNOSIS — E1122 Type 2 diabetes mellitus with diabetic chronic kidney disease: Secondary | ICD-10-CM | POA: Diagnosis not present

## 2017-05-24 DIAGNOSIS — J449 Chronic obstructive pulmonary disease, unspecified: Secondary | ICD-10-CM | POA: Diagnosis not present

## 2017-05-24 DIAGNOSIS — Z9181 History of falling: Secondary | ICD-10-CM | POA: Diagnosis not present

## 2017-05-24 DIAGNOSIS — Z950 Presence of cardiac pacemaker: Secondary | ICD-10-CM | POA: Diagnosis not present

## 2017-05-24 DIAGNOSIS — M1990 Unspecified osteoarthritis, unspecified site: Secondary | ICD-10-CM | POA: Diagnosis not present

## 2017-05-24 DIAGNOSIS — I2510 Atherosclerotic heart disease of native coronary artery without angina pectoris: Secondary | ICD-10-CM | POA: Diagnosis not present

## 2017-05-24 DIAGNOSIS — I5022 Chronic systolic (congestive) heart failure: Secondary | ICD-10-CM | POA: Diagnosis not present

## 2017-05-24 DIAGNOSIS — I130 Hypertensive heart and chronic kidney disease with heart failure and stage 1 through stage 4 chronic kidney disease, or unspecified chronic kidney disease: Secondary | ICD-10-CM | POA: Diagnosis not present

## 2017-05-24 DIAGNOSIS — I4891 Unspecified atrial fibrillation: Secondary | ICD-10-CM | POA: Diagnosis not present

## 2017-05-24 MED ORDER — GLUCOSE BLOOD VI STRP: ORAL_STRIP | 5 refills | Status: AC

## 2017-05-24 MED ORDER — ONETOUCH ULTRA 2 W/DEVICE KIT: PACK | 0 refills | Status: AC

## 2017-05-24 NOTE — Telephone Encounter (Signed)
[name] (DPR signed) said onetouch ultra meter is broken and request new meter and test strips; [facility]; done per protocol and rose viced understanding.

## 2017-05-26 ENCOUNTER — Telehealth: Payer: Self-pay

## 2017-05-26 ENCOUNTER — Encounter: Payer: Medicare Other | Admitting: Physical Medicine & Rehabilitation

## 2017-05-26 DIAGNOSIS — I5022 Chronic systolic (congestive) heart failure: Secondary | ICD-10-CM | POA: Diagnosis not present

## 2017-05-26 DIAGNOSIS — E1142 Type 2 diabetes mellitus with diabetic polyneuropathy: Secondary | ICD-10-CM | POA: Diagnosis not present

## 2017-05-26 DIAGNOSIS — Z9181 History of falling: Secondary | ICD-10-CM | POA: Diagnosis not present

## 2017-05-26 DIAGNOSIS — I4891 Unspecified atrial fibrillation: Secondary | ICD-10-CM | POA: Diagnosis not present

## 2017-05-26 DIAGNOSIS — I2510 Atherosclerotic heart disease of native coronary artery without angina pectoris: Secondary | ICD-10-CM | POA: Diagnosis not present

## 2017-05-26 DIAGNOSIS — I130 Hypertensive heart and chronic kidney disease with heart failure and stage 1 through stage 4 chronic kidney disease, or unspecified chronic kidney disease: Secondary | ICD-10-CM | POA: Diagnosis not present

## 2017-05-26 DIAGNOSIS — E1122 Type 2 diabetes mellitus with diabetic chronic kidney disease: Secondary | ICD-10-CM | POA: Diagnosis not present

## 2017-05-26 DIAGNOSIS — Z950 Presence of cardiac pacemaker: Secondary | ICD-10-CM | POA: Diagnosis not present

## 2017-05-26 DIAGNOSIS — Z794 Long term (current) use of insulin: Secondary | ICD-10-CM | POA: Diagnosis not present

## 2017-05-26 DIAGNOSIS — N183 Chronic kidney disease, stage 3 (moderate): Secondary | ICD-10-CM | POA: Diagnosis not present

## 2017-05-26 DIAGNOSIS — J449 Chronic obstructive pulmonary disease, unspecified: Secondary | ICD-10-CM | POA: Diagnosis not present

## 2017-05-26 DIAGNOSIS — M1990 Unspecified osteoarthritis, unspecified site: Secondary | ICD-10-CM | POA: Diagnosis not present

## 2017-05-26 NOTE — Telephone Encounter (Signed)
Danielle Benitez with [facility] left v/m continuing to work with pt on safety. Pt fell in kitchen last night;pt hit her head, bruise on lt temple; no N&V, no double vision, no dizziness; pt feels sore and skin tear on lt elbow that daughter dressed with neosporin. Will continue to work on safety; no cb unless Dr Para March has further instructions or comments.

## 2017-05-27 DIAGNOSIS — I130 Hypertensive heart and chronic kidney disease with heart failure and stage 1 through stage 4 chronic kidney disease, or unspecified chronic kidney disease: Secondary | ICD-10-CM | POA: Diagnosis not present

## 2017-05-27 DIAGNOSIS — I2510 Atherosclerotic heart disease of native coronary artery without angina pectoris: Secondary | ICD-10-CM | POA: Diagnosis not present

## 2017-05-27 DIAGNOSIS — Z794 Long term (current) use of insulin: Secondary | ICD-10-CM | POA: Diagnosis not present

## 2017-05-27 DIAGNOSIS — Z950 Presence of cardiac pacemaker: Secondary | ICD-10-CM | POA: Diagnosis not present

## 2017-05-27 DIAGNOSIS — I5022 Chronic systolic (congestive) heart failure: Secondary | ICD-10-CM | POA: Diagnosis not present

## 2017-05-27 DIAGNOSIS — I4891 Unspecified atrial fibrillation: Secondary | ICD-10-CM | POA: Diagnosis not present

## 2017-05-27 DIAGNOSIS — J449 Chronic obstructive pulmonary disease, unspecified: Secondary | ICD-10-CM | POA: Diagnosis not present

## 2017-05-27 DIAGNOSIS — N183 Chronic kidney disease, stage 3 (moderate): Secondary | ICD-10-CM | POA: Diagnosis not present

## 2017-05-27 DIAGNOSIS — E1122 Type 2 diabetes mellitus with diabetic chronic kidney disease: Secondary | ICD-10-CM | POA: Diagnosis not present

## 2017-05-27 DIAGNOSIS — E1142 Type 2 diabetes mellitus with diabetic polyneuropathy: Secondary | ICD-10-CM | POA: Diagnosis not present

## 2017-05-27 DIAGNOSIS — M1990 Unspecified osteoarthritis, unspecified site: Secondary | ICD-10-CM | POA: Diagnosis not present

## 2017-05-27 DIAGNOSIS — Z9181 History of falling: Secondary | ICD-10-CM | POA: Diagnosis not present

## 2017-05-27 NOTE — Telephone Encounter (Signed)
Agreed.  Thanks.  

## 2017-05-27 NOTE — Telephone Encounter (Signed)
Have left multiple messages with no return call, per chart pt's daughter has contacted pcp.

## 2017-06-01 ENCOUNTER — Encounter: Payer: Self-pay | Admitting: Cardiology

## 2017-06-02 ENCOUNTER — Telehealth: Payer: Self-pay

## 2017-06-02 ENCOUNTER — Telehealth (HOSPITAL_COMMUNITY): Payer: Self-pay | Admitting: Cardiology

## 2017-06-02 DIAGNOSIS — I2510 Atherosclerotic heart disease of native coronary artery without angina pectoris: Secondary | ICD-10-CM | POA: Diagnosis not present

## 2017-06-02 DIAGNOSIS — N183 Chronic kidney disease, stage 3 (moderate): Secondary | ICD-10-CM | POA: Diagnosis not present

## 2017-06-02 DIAGNOSIS — I5022 Chronic systolic (congestive) heart failure: Secondary | ICD-10-CM | POA: Diagnosis not present

## 2017-06-02 DIAGNOSIS — Z9181 History of falling: Secondary | ICD-10-CM | POA: Diagnosis not present

## 2017-06-02 DIAGNOSIS — I130 Hypertensive heart and chronic kidney disease with heart failure and stage 1 through stage 4 chronic kidney disease, or unspecified chronic kidney disease: Secondary | ICD-10-CM | POA: Diagnosis not present

## 2017-06-02 DIAGNOSIS — E1142 Type 2 diabetes mellitus with diabetic polyneuropathy: Secondary | ICD-10-CM | POA: Diagnosis not present

## 2017-06-02 DIAGNOSIS — E1122 Type 2 diabetes mellitus with diabetic chronic kidney disease: Secondary | ICD-10-CM | POA: Diagnosis not present

## 2017-06-02 DIAGNOSIS — M1990 Unspecified osteoarthritis, unspecified site: Secondary | ICD-10-CM | POA: Diagnosis not present

## 2017-06-02 DIAGNOSIS — I5020 Unspecified systolic (congestive) heart failure: Secondary | ICD-10-CM

## 2017-06-02 DIAGNOSIS — Z794 Long term (current) use of insulin: Secondary | ICD-10-CM | POA: Diagnosis not present

## 2017-06-02 DIAGNOSIS — Z950 Presence of cardiac pacemaker: Secondary | ICD-10-CM | POA: Diagnosis not present

## 2017-06-02 DIAGNOSIS — I4891 Unspecified atrial fibrillation: Secondary | ICD-10-CM | POA: Diagnosis not present

## 2017-06-02 DIAGNOSIS — J449 Chronic obstructive pulmonary disease, unspecified: Secondary | ICD-10-CM | POA: Diagnosis not present

## 2017-06-02 NOTE — Telephone Encounter (Signed)
Danielle Benitez with [facility] left v/m to verify that furosemide 80 mg is twice a day and verbal orders HH aide 2 x a week for 2 more weeks. Vernona Rieger request cb.

## 2017-06-02 NOTE — Telephone Encounter (Signed)
Patients daughter called with concerns regarding increased weight and mild SOB. Reports weight today is 200.8, normally 195-196.  Advised to increase Lasix to 80 BID x 2 days ( as done in the past) and repeat BMET 8/27 or 8/28  [name] agreeable with plan and will have labs done at PCP ([facility])  Forward to [employer] for co sign

## 2017-06-02 NOTE — Telephone Encounter (Signed)
Agree.    Casimiro Needle [address] [place], PA-C 06/02/2017 11:44 AM

## 2017-06-03 ENCOUNTER — Encounter (HOSPITAL_COMMUNITY): Payer: Self-pay | Admitting: Emergency Medicine

## 2017-06-03 ENCOUNTER — Observation Stay (HOSPITAL_COMMUNITY): Payer: Medicare Other

## 2017-06-03 ENCOUNTER — Emergency Department (HOSPITAL_COMMUNITY): Payer: Medicare Other

## 2017-06-03 ENCOUNTER — Observation Stay (HOSPITAL_COMMUNITY)
Admission: EM | Admit: 2017-06-03 | Discharge: 2017-06-04 | Disposition: A | Discharge status: Home / Self Care | Payer: Medicare Other | Attending: Family Medicine | Admitting: Family Medicine

## 2017-06-03 DIAGNOSIS — D696 Thrombocytopenia, unspecified: Secondary | ICD-10-CM | POA: Insufficient documentation

## 2017-06-03 DIAGNOSIS — N393 Stress incontinence (female) (male): Secondary | ICD-10-CM | POA: Insufficient documentation

## 2017-06-03 DIAGNOSIS — F329 Major depressive disorder, single episode, unspecified: Secondary | ICD-10-CM | POA: Diagnosis not present

## 2017-06-03 DIAGNOSIS — N179 Acute kidney failure, unspecified: Secondary | ICD-10-CM | POA: Insufficient documentation

## 2017-06-03 DIAGNOSIS — Z6839 Body mass index (BMI) 39.0-39.9, adult: Secondary | ICD-10-CM | POA: Insufficient documentation

## 2017-06-03 DIAGNOSIS — D62 Acute posthemorrhagic anemia: Secondary | ICD-10-CM | POA: Insufficient documentation

## 2017-06-03 DIAGNOSIS — G4700 Insomnia, unspecified: Secondary | ICD-10-CM | POA: Insufficient documentation

## 2017-06-03 DIAGNOSIS — E1165 Type 2 diabetes mellitus with hyperglycemia: Secondary | ICD-10-CM

## 2017-06-03 DIAGNOSIS — I5022 Chronic systolic (congestive) heart failure: Secondary | ICD-10-CM | POA: Diagnosis not present

## 2017-06-03 DIAGNOSIS — Z8673 Personal history of transient ischemic attack (TIA), and cerebral infarction without residual deficits: Secondary | ICD-10-CM | POA: Insufficient documentation

## 2017-06-03 DIAGNOSIS — E1151 Type 2 diabetes mellitus with diabetic peripheral angiopathy without gangrene: Secondary | ICD-10-CM | POA: Diagnosis not present

## 2017-06-03 DIAGNOSIS — I5032 Chronic diastolic (congestive) heart failure: Secondary | ICD-10-CM | POA: Diagnosis not present

## 2017-06-03 DIAGNOSIS — R531 Weakness: Secondary | ICD-10-CM | POA: Diagnosis not present

## 2017-06-03 DIAGNOSIS — Z888 Allergy status to other drugs, medicaments and biological substances status: Secondary | ICD-10-CM | POA: Insufficient documentation

## 2017-06-03 DIAGNOSIS — Z794 Long term (current) use of insulin: Secondary | ICD-10-CM | POA: Insufficient documentation

## 2017-06-03 DIAGNOSIS — K224 Dyskinesia of esophagus: Secondary | ICD-10-CM | POA: Diagnosis not present

## 2017-06-03 DIAGNOSIS — I350 Nonrheumatic aortic (valve) stenosis: Secondary | ICD-10-CM | POA: Insufficient documentation

## 2017-06-03 DIAGNOSIS — E871 Hypo-osmolality and hyponatremia: Secondary | ICD-10-CM | POA: Diagnosis not present

## 2017-06-03 DIAGNOSIS — I313 Pericardial effusion (noninflammatory): Secondary | ICD-10-CM | POA: Insufficient documentation

## 2017-06-03 DIAGNOSIS — E669 Obesity, unspecified: Secondary | ICD-10-CM | POA: Insufficient documentation

## 2017-06-03 DIAGNOSIS — Z950 Presence of cardiac pacemaker: Secondary | ICD-10-CM | POA: Insufficient documentation

## 2017-06-03 DIAGNOSIS — R29810 Facial weakness: Secondary | ICD-10-CM | POA: Diagnosis not present

## 2017-06-03 DIAGNOSIS — E1121 Type 2 diabetes mellitus with diabetic nephropathy: Secondary | ICD-10-CM | POA: Diagnosis not present

## 2017-06-03 DIAGNOSIS — R4781 Slurred speech: Secondary | ICD-10-CM | POA: Diagnosis not present

## 2017-06-03 DIAGNOSIS — I4510 Unspecified right bundle-branch block: Secondary | ICD-10-CM | POA: Insufficient documentation

## 2017-06-03 DIAGNOSIS — K648 Other hemorrhoids: Secondary | ICD-10-CM | POA: Insufficient documentation

## 2017-06-03 DIAGNOSIS — E785 Hyperlipidemia, unspecified: Secondary | ICD-10-CM | POA: Insufficient documentation

## 2017-06-03 DIAGNOSIS — E1122 Type 2 diabetes mellitus with diabetic chronic kidney disease: Secondary | ICD-10-CM | POA: Diagnosis not present

## 2017-06-03 DIAGNOSIS — N390 Urinary tract infection, site not specified: Secondary | ICD-10-CM | POA: Insufficient documentation

## 2017-06-03 DIAGNOSIS — Z7901 Long term (current) use of anticoagulants: Secondary | ICD-10-CM | POA: Insufficient documentation

## 2017-06-03 DIAGNOSIS — G92 Toxic encephalopathy: Secondary | ICD-10-CM | POA: Diagnosis not present

## 2017-06-03 DIAGNOSIS — I480 Paroxysmal atrial fibrillation: Secondary | ICD-10-CM | POA: Insufficient documentation

## 2017-06-03 DIAGNOSIS — I6789 Other cerebrovascular disease: Secondary | ICD-10-CM | POA: Diagnosis not present

## 2017-06-03 DIAGNOSIS — I10 Essential (primary) hypertension: Secondary | ICD-10-CM

## 2017-06-03 DIAGNOSIS — Z9842 Cataract extraction status, left eye: Secondary | ICD-10-CM | POA: Insufficient documentation

## 2017-06-03 DIAGNOSIS — Z8249 Family history of ischemic heart disease and other diseases of the circulatory system: Secondary | ICD-10-CM | POA: Insufficient documentation

## 2017-06-03 DIAGNOSIS — I252 Old myocardial infarction: Secondary | ICD-10-CM | POA: Insufficient documentation

## 2017-06-03 DIAGNOSIS — IMO0002 Reserved for concepts with insufficient information to code with codable children: Secondary | ICD-10-CM | POA: Diagnosis present

## 2017-06-03 DIAGNOSIS — Z833 Family history of diabetes mellitus: Secondary | ICD-10-CM | POA: Insufficient documentation

## 2017-06-03 DIAGNOSIS — I452 Bifascicular block: Secondary | ICD-10-CM | POA: Insufficient documentation

## 2017-06-03 DIAGNOSIS — J449 Chronic obstructive pulmonary disease, unspecified: Secondary | ICD-10-CM | POA: Insufficient documentation

## 2017-06-03 DIAGNOSIS — E1129 Type 2 diabetes mellitus with other diabetic kidney complication: Secondary | ICD-10-CM | POA: Diagnosis present

## 2017-06-03 DIAGNOSIS — N183 Chronic kidney disease, stage 3 unspecified: Secondary | ICD-10-CM | POA: Diagnosis present

## 2017-06-03 DIAGNOSIS — R410 Disorientation, unspecified: Secondary | ICD-10-CM

## 2017-06-03 DIAGNOSIS — I2510 Atherosclerotic heart disease of native coronary artery without angina pectoris: Secondary | ICD-10-CM | POA: Diagnosis not present

## 2017-06-03 DIAGNOSIS — M109 Gout, unspecified: Secondary | ICD-10-CM | POA: Insufficient documentation

## 2017-06-03 DIAGNOSIS — R29818 Other symptoms and signs involving the nervous system: Secondary | ICD-10-CM | POA: Diagnosis not present

## 2017-06-03 DIAGNOSIS — Z809 Family history of malignant neoplasm, unspecified: Secondary | ICD-10-CM | POA: Insufficient documentation

## 2017-06-03 DIAGNOSIS — Z9841 Cataract extraction status, right eye: Secondary | ICD-10-CM | POA: Insufficient documentation

## 2017-06-03 DIAGNOSIS — Z803 Family history of malignant neoplasm of breast: Secondary | ICD-10-CM | POA: Insufficient documentation

## 2017-06-03 DIAGNOSIS — I951 Orthostatic hypotension: Secondary | ICD-10-CM | POA: Insufficient documentation

## 2017-06-03 DIAGNOSIS — I517 Cardiomegaly: Secondary | ICD-10-CM | POA: Diagnosis not present

## 2017-06-03 DIAGNOSIS — K219 Gastro-esophageal reflux disease without esophagitis: Secondary | ICD-10-CM | POA: Insufficient documentation

## 2017-06-03 DIAGNOSIS — I130 Hypertensive heart and chronic kidney disease with heart failure and stage 1 through stage 4 chronic kidney disease, or unspecified chronic kidney disease: Secondary | ICD-10-CM | POA: Diagnosis not present

## 2017-06-03 DIAGNOSIS — M1990 Unspecified osteoarthritis, unspecified site: Secondary | ICD-10-CM | POA: Insufficient documentation

## 2017-06-03 LAB — URINALYSIS, ROUTINE W REFLEX MICROSCOPIC
Bilirubin Urine: NEGATIVE
Glucose, UA: NEGATIVE mg/dL
Hgb urine dipstick: NEGATIVE
Ketones, ur: NEGATIVE mg/dL
Nitrite: NEGATIVE
Protein, ur: NEGATIVE mg/dL
Specific Gravity, Urine: 1.013 (ref 1.005–1.030)
pH: 6 (ref 5.0–8.0)

## 2017-06-03 LAB — I-STAT CHEM 8, ED
BUN: 43 mg/dL — AB (ref 6–20)
CALCIUM ION: 1.02 mmol/L — AB (ref 1.15–1.40)
Chloride: 102 mmol/L (ref 101–111)
Creatinine, Ser: 2 mg/dL — ABNORMAL HIGH (ref 0.44–1.00)
GLUCOSE: 197 mg/dL — AB (ref 65–99)
HCT: 35 % — ABNORMAL LOW (ref 36.0–46.0)
Hemoglobin: 11.9 g/dL — ABNORMAL LOW (ref 12.0–15.0)
Potassium: 3.9 mmol/L (ref 3.5–5.1)
SODIUM: 141 mmol/L (ref 135–145)
TCO2: 29 mmol/L (ref 22–32)

## 2017-06-03 LAB — COMPREHENSIVE METABOLIC PANEL WITH GFR
ALT: UNDETERMINED U/L (ref 14–54)
AST: 62 U/L — ABNORMAL HIGH (ref 15–41)
Albumin: 2.6 g/dL — ABNORMAL LOW (ref 3.5–5.0)
Alkaline Phosphatase: 127 U/L — ABNORMAL HIGH (ref 38–126)
Anion gap: 17 — ABNORMAL HIGH (ref 5–15)
BUN: 30 mg/dL — ABNORMAL HIGH (ref 6–20)
CO2: 20 mmol/L — ABNORMAL LOW (ref 22–32)
Calcium: 8.7 mg/dL — ABNORMAL LOW (ref 8.9–10.3)
Chloride: 103 mmol/L (ref 101–111)
Creatinine, Ser: 2.07 mg/dL — ABNORMAL HIGH (ref 0.44–1.00)
GFR calc Af Amer: 24 mL/min — ABNORMAL LOW
GFR calc non Af Amer: 21 mL/min — ABNORMAL LOW
Glucose, Bld: 195 mg/dL — ABNORMAL HIGH (ref 65–99)
Potassium: 3.9 mmol/L (ref 3.5–5.1)
Sodium: 140 mmol/L (ref 135–145)
Total Bilirubin: UNDETERMINED mg/dL (ref 0.3–1.2)
Total Protein: 6.8 g/dL (ref 6.5–8.1)

## 2017-06-03 LAB — I-STAT TROPONIN, ED: Troponin i, poc: 0 ng/mL (ref 0.00–0.08)

## 2017-06-03 LAB — DIFFERENTIAL
Basophils Absolute: 0.1 10*3/uL (ref 0.0–0.1)
Basophils Relative: 1 %
Eosinophils Absolute: 0.2 10*3/uL (ref 0.0–0.7)
Eosinophils Relative: 2 %
Lymphocytes Relative: 26 %
Lymphs Abs: 2.1 10*3/uL (ref 0.7–4.0)
Monocytes Absolute: 1 10*3/uL (ref 0.1–1.0)
Monocytes Relative: 12 %
Neutro Abs: 4.9 10*3/uL (ref 1.7–7.7)
Neutrophils Relative %: 59 %

## 2017-06-03 LAB — CBC
HCT: 33 % — ABNORMAL LOW (ref 36.0–46.0)
Hemoglobin: 10.5 g/dL — ABNORMAL LOW (ref 12.0–15.0)
MCH: 29.5 pg (ref 26.0–34.0)
MCHC: 31.8 g/dL (ref 30.0–36.0)
MCV: 92.7 fL (ref 78.0–100.0)
PLATELETS: 87 10*3/uL — AB (ref 150–400)
RBC: 3.56 MIL/uL — ABNORMAL LOW (ref 3.87–5.11)
RDW: 19 % — AB (ref 11.5–15.5)
WBC: 8.2 10*3/uL (ref 4.0–10.5)

## 2017-06-03 LAB — APTT: aPTT: 35 seconds (ref 24–36)

## 2017-06-03 LAB — PROTIME-INR
INR: 1.82
PROTHROMBIN TIME: 21.3 s — AB (ref 11.4–15.2)

## 2017-06-03 LAB — VITAMIN B12: Vitamin B-12: 1453 pg/mL — ABNORMAL HIGH (ref 180–914)

## 2017-06-03 LAB — GLUCOSE, CAPILLARY
GLUCOSE-CAPILLARY: 110 mg/dL — AB (ref 65–99)
GLUCOSE-CAPILLARY: 119 mg/dL — AB (ref 65–99)

## 2017-06-03 LAB — TSH: TSH: 1.715 u[IU]/mL (ref 0.350–4.500)

## 2017-06-03 LAB — MAGNESIUM: Magnesium: 1.9 mg/dL (ref 1.7–2.4)

## 2017-06-03 LAB — CBG MONITORING, ED
Glucose-Capillary: 193 mg/dL — ABNORMAL HIGH (ref 65–99)
Glucose-Capillary: 123 mg/dL — ABNORMAL HIGH (ref 65–99)

## 2017-06-03 LAB — PHOSPHORUS: Phosphorus: 2.8 mg/dL (ref 2.5–4.6)

## 2017-06-03 LAB — BRAIN NATRIURETIC PEPTIDE: B Natriuretic Peptide: 303.4 pg/mL — ABNORMAL HIGH (ref 0.0–100.0)

## 2017-06-03 MED ORDER — SENNOSIDES-DOCUSATE SODIUM 8.6-50 MG PO TABS: 1.0000 | ORAL_TABLET | Freq: Every day | ORAL | Status: DC | PRN

## 2017-06-03 MED ORDER — SODIUM CHLORIDE 0.9 % IV SOLN: 250.0000 mL | INTRAVENOUS | Status: DC | PRN

## 2017-06-03 MED ORDER — ALLOPURINOL 300 MG PO TABS
300.0000 mg | ORAL_TABLET | Freq: Every day | ORAL | Status: DC
  Administered 2017-06-04: 300 mg via ORAL
  Filled 2017-06-03: qty 1
  Filled 2017-06-03: qty 3
  Filled 2017-06-03: qty 1

## 2017-06-03 MED ORDER — ATORVASTATIN CALCIUM 20 MG PO TABS
20.0000 mg | ORAL_TABLET | Freq: Every day | ORAL | Status: DC
  Filled 2017-06-03 (×2): qty 1

## 2017-06-03 MED ORDER — ONDANSETRON HCL 4 MG/2ML IJ SOLN: 4.0000 mg | Freq: Four times a day (QID) | INTRAMUSCULAR | Status: DC | PRN

## 2017-06-03 MED ORDER — INSULIN GLARGINE 100 UNIT/ML ~~LOC~~ SOLN
12.0000 [IU] | Freq: Every day | SUBCUTANEOUS | Status: DC
  Administered 2017-06-04: 12 [IU] via SUBCUTANEOUS
  Filled 2017-06-03 (×2): qty 0.12

## 2017-06-03 MED ORDER — ONDANSETRON HCL 4 MG PO TABS: 4.0000 mg | ORAL_TABLET | Freq: Four times a day (QID) | ORAL | Status: DC | PRN

## 2017-06-03 MED ORDER — SODIUM CHLORIDE 0.9% FLUSH: 3.0000 mL | INTRAVENOUS | Status: DC | PRN

## 2017-06-03 MED ORDER — APIXABAN 2.5 MG PO TABS
2.5000 mg | ORAL_TABLET | Freq: Two times a day (BID) | ORAL | Status: DC
  Administered 2017-06-04: 2.5 mg via ORAL
  Filled 2017-06-03: qty 1

## 2017-06-03 MED ORDER — DEXTROSE 5 % IV SOLN
1.0000 g | Freq: Once | INTRAVENOUS | Status: AC
  Administered 2017-06-03: 1 g via INTRAVENOUS
  Filled 2017-06-03: qty 10

## 2017-06-03 MED ORDER — ACETAMINOPHEN 650 MG RE SUPP: 650.0000 mg | Freq: Four times a day (QID) | RECTAL | Status: DC | PRN

## 2017-06-03 MED ORDER — PANTOPRAZOLE SODIUM 40 MG IV SOLR
40.0000 mg | Freq: Every day | INTRAVENOUS | Status: DC
  Administered 2017-06-03: 40 mg via INTRAVENOUS
  Filled 2017-06-03: qty 40

## 2017-06-03 MED ORDER — SODIUM CHLORIDE 0.9% FLUSH: 3.0000 mL | Freq: Two times a day (BID) | INTRAVENOUS | Status: DC

## 2017-06-03 MED ORDER — ACETAMINOPHEN 325 MG PO TABS: 650.0000 mg | ORAL_TABLET | Freq: Four times a day (QID) | ORAL | Status: DC | PRN

## 2017-06-03 MED ORDER — TRAZODONE HCL 50 MG PO TABS: 25.0000 mg | ORAL_TABLET | Freq: Every evening | ORAL | Status: DC | PRN

## 2017-06-03 MED ORDER — FLUTICASONE PROPIONATE 50 MCG/ACT NA SUSP: 1.0000 | Freq: Every day | NASAL | Status: DC | PRN

## 2017-06-03 MED ORDER — LORATADINE 10 MG PO TABS
5.0000 mg | ORAL_TABLET | Freq: Every day | ORAL | Status: DC
  Administered 2017-06-04: 5 mg via ORAL
  Filled 2017-06-03: qty 1

## 2017-06-03 MED ORDER — METOPROLOL SUCCINATE 12.5 MG HALF TABLET
12.5000 mg | ORAL_TABLET | Freq: Two times a day (BID) | ORAL | Status: DC
  Administered 2017-06-04: 12.5 mg via ORAL
  Filled 2017-06-03 (×3): qty 1

## 2017-06-03 MED ORDER — DEXTROSE-NACL 5-0.9 % IV SOLN
INTRAVENOUS | Status: DC
  Administered 2017-06-03: 23:00:00 via INTRAVENOUS

## 2017-06-03 MED ORDER — DULOXETINE HCL 20 MG PO CPEP
20.0000 mg | ORAL_CAPSULE | Freq: Every day | ORAL | Status: DC
  Administered 2017-06-04: 20 mg via ORAL
  Filled 2017-06-03 (×2): qty 1

## 2017-06-03 MED ORDER — DEXTROSE 5 % IV SOLN
1.0000 g | INTRAVENOUS | Status: DC
  Filled 2017-06-03: qty 10

## 2017-06-03 MED ORDER — ALBUTEROL SULFATE (2.5 MG/3ML) 0.083% IN NEBU: 3.0000 mL | INHALATION_SOLUTION | RESPIRATORY_TRACT | Status: DC | PRN

## 2017-06-03 MED ORDER — AMIODARONE HCL 200 MG PO TABS
200.0000 mg | ORAL_TABLET | Freq: Every day | ORAL | Status: DC
  Administered 2017-06-04: 200 mg via ORAL
  Filled 2017-06-03: qty 1

## 2017-06-03 MED ORDER — SODIUM CHLORIDE 0.9 % IV SOLN: INTRAVENOUS | Status: DC

## 2017-06-03 MED ORDER — DEXTROSE 5 % AND 0.9 % NACL IV BOLUS: 50.0000 mL | INTRAVENOUS | Status: DC

## 2017-06-03 MED ORDER — PANTOPRAZOLE SODIUM 40 MG PO TBEC: 40.0000 mg | DELAYED_RELEASE_TABLET | Freq: Every day | ORAL | Status: DC

## 2017-06-03 NOTE — ED Notes (Signed)
Attempted to call report

## 2017-06-03 NOTE — Telephone Encounter (Signed)
Patient is taking Furosemide 80 mg in morning and 40 mg in afternoon.  Sometimes it goes up to 80 mg in afternoon if her weight is up.

## 2017-06-03 NOTE — ED Notes (Signed)
Pt daughter concerned that pt has not received lasix today. EDP made aware.

## 2017-06-03 NOTE — Telephone Encounter (Signed)
Thanks

## 2017-06-03 NOTE — Consult Note (Addendum)
Requesting Physician: Dr. Clayborne Dana    Chief Complaint: Left facial droop, slurred speech  History obtained from:  Chart and report  HPI:                                                                                                                                      Danielle Benitez is an 83 y.o. female with a past medical history significant for DMII, CKD, carotid artery disease, MI, afib on eliquis, lacunar stroke of unknown age and dementia presents to [facility] via EMS with complaints of left facial droop and slurred speech. Family stated that she was fine at 0700 this morning and at 0735 was found with a left facial droop and listing to the right side. She was brought to [facility] for further evaluation and treatment.  Notably, Danielle Benitez presented to [facility] [id number] with slurred speech and right sided weakness. At that time she was found to have a UTI.  Pertinent Imaging: CT head: Chronic small vessel disease, remote thalamic lacunar infarct.  Pertinent Medications: Cymbalta 20mg  daily Trazodone 50mg  PRN sleep Lasix 80mg  in the morning, 40mg  qhs Eliquis 2.5mg  daily Amiodarone 200mg  daily  Date last known well: Date: 06/03/2017 Time last known well: Time: 07:30  tPA Given: No: On Eliquis  Modified Rankin Score: 3  Stroke Risk Factors - atrial fibrillation, carotid stenosis and diabetes mellitus   Past Medical History:  Diagnosis Date  . Abnormality of gait   . Anemia, unspecified   . Anticoagulated- Eliquis 07/31/2015  . Anxiety state, unspecified   . Aortic stenosis    a. Mild by echo 08/2013, not seen on repeat echo 02/2017.  Marland Kitchen Bifascicular block    afib  . Carotid artery disease (HCC)    a. Carotid dopplers (9/17) with 40-59% LICA stenosis (of note, prior ABI normal).  . Chronic diastolic CHF (congestive heart failure) (HCC)    a. Dx 07/2015 - EF 35-40%, unclear etiology ? r/t afib or viral etiology. b. EF normal in 02/2017.  . CKD (chronic kidney disease), stage III   . Depressive  disorder, not elsewhere classified   . DM (diabetes mellitus), type 2, uncontrolled, with renal complications (HCC) 01/08/2012  . Eczema 11/22/2013  . Esophageal dysmotility 08/05/2015  . Essential hypertension   . Female stress incontinence 05/06/2015  . Full dentures   . GERD (gastroesophageal reflux disease)   . Gout   . HOH (hard of hearing)   . Internal hemorrhoids without mention of complication   . Iron deficiency   . Lumbago   . Memory loss   . Mild CAD    a. Cath 2013: 20-30% prox RCA.  Marland Kitchen Myocardial infarction (HCC) 07/2015  . Nonspecific abnormal results of liver function study   . Obesity, unspecified   . Orthostatic hypotension   . Osteoarthritis   . Other and unspecified hyperlipidemia   . PAF (paroxysmal atrial fibrillation) (HCC) 07/11/2015  a. prior DCCVs, amiodarone, anticoagulation.  . Pain in joint, shoulder region 11/22/2013   left   . Personal history of fall   . Presence of permanent cardiac pacemaker   . Pruritus 11/22/2013  . S/P cardiac pacemaker procedure 01/11/2012   a. for symptomatic bradycardia. Followed by Dr. Ladona Ridgel. Device changed out 2017 due to ppm infection.  . Stroke [facility])    a. evidence of prior strokes on MRI in 2018.  Marland Kitchen UTI (urinary tract infection) 04/2017  . Wears glasses     Past Surgical History:  Procedure Laterality Date  . CARDIAC CATHETERIZATION  12/03/2003   Dr Clarene Duke  . CARDIOVERSION N/A 07/25/2015   Procedure: CARDIOVERSION;  Surgeon: Pricilla Riffle, MD;  Location: [facility] ENDOSCOPY;  Service: Cardiovascular;  Laterality: N/A;  . CARDIOVERSION N/A 08/06/2015   Procedure: CARDIOVERSION;  Surgeon: Laurey Morale, MD;  Location: [facility] ENDOSCOPY;  Service: Cardiovascular;  Laterality: N/A;  . CATARACT EXTRACTION W/ INTRAOCULAR LENS  IMPLANT, BILATERAL Bilateral 03/2015-04/2015  . COLONOSCOPY     ?????  . EP IMPLANTABLE DEVICE N/A 04/15/2016   Procedure: Pacemaker Implant;  Surgeon: Marinus Maw, MD;  Location: [facility] INVASIVE CV LAB;   Service: Cardiovascular;  Laterality: N/A;  . ESOPHAGOGASTRODUODENOSCOPY N/A 08/08/2015   Procedure: ESOPHAGOGASTRODUODENOSCOPY (EGD);  Surgeon: Iva Boop, MD;  Location: [facility] ENDOSCOPY;  Service: Endoscopy;  Laterality: N/A;  . EXTREMITY CYST EXCISION     finger  . EYE SURGERY Bilateral    catartact  . INSERT / REPLACE / REMOVE PACEMAKER    . LEFT HEART CATHETERIZATION WITH CORONARY ANGIOGRAM N/A 01/10/2012   Procedure: LEFT HEART CATHETERIZATION WITH CORONARY ANGIOGRAM;  Surgeon: Chrystie Nose, MD;  Location: [facility] CATH LAB;  Service: Cardiovascular;  Laterality: N/A;  . ORIF WRIST FRACTURE Right 04/09/2014   Procedure: OPEN REDUCTION INTERNAL FIXATION (ORIF) RIGHT DISPLACED  DISTAL RADIUS  FRACTURE;  Surgeon: Dominica Severin, MD;  Location: [facility];  Service: Orthopedics;  Laterality: Right;  . PACEMAKER LEAD REMOVAL  04/12/2016   TEMPORARY PACEMAKER INSERTION  . PACEMAKER LEAD REMOVAL  04/15/2016   Procedure: Pacemaker Lead Removal;  Surgeon: Marinus Maw, MD;  Location: [facility] INVASIVE CV LAB;  Service: Cardiovascular;;  . PACEMAKER LEAD REMOVAL N/A 04/12/2016   Procedure: PACEMAKER LEAD REMOVAL;  Surgeon: Marinus Maw, MD;  Location: [facility] OR;  Service: Cardiovascular;  Laterality: N/A;  . PERMANENT PACEMAKER INSERTION Left 01/11/2012   Procedure: PERMANENT PACEMAKER INSERTION;  Surgeon: Thurmon Fair, MD;  Location: [facility] CATH LAB;  Service: Cardiovascular;  Laterality: Left;  . TEE WITHOUT CARDIOVERSION N/A 07/25/2015   Procedure: TRANSESOPHAGEAL ECHOCARDIOGRAM (TEE);  Surgeon: Pricilla Riffle, MD;  Location: [facility] ENDOSCOPY;  Service: Cardiovascular;  Laterality: N/A;    Family History  Problem Relation Age of Onset  . Breast cancer Mother   . Cancer Mother   . Diabetes Daughter   . Hypertension Daughter   . Cancer Brother      reports that she has never smoked. She has never used smokeless tobacco. She reports that she does not drink alcohol or use drugs.  Allergies   Allergen Reactions  . Beta Adrenergic Blockers Other (See Comments)    Couldn't speak or hear Metoprolol seems ok  . Jardiance [Empagliflozin] Other (See Comments)    Rash, kidney issues  . Spironolactone     Hyperkalemia    Medications:  No outpatient prescriptions have been marked as taking for the 06/03/17 encounter [facility] Encounter).    Review Of Systems:                                                                                                           History obtained from unobtainable from patient due to mental status  Blood pressure (!) 163/75, pulse 70, temperature 97.6 F (36.4 C), temperature source Axillary, resp. rate 17, height 5\' 1"  (1.549 m), weight 95.1 kg (209 lb 10.5 oz), SpO2 100 %.   Physical Examination:                                                                                                      General: WD obese female.  HEENT:  Normocephalic, no lesions, without obvious abnormality.  Normal external eye and conjunctiva.  Normal external ears. Normal external nose, mucus membranes and septum.  Normal pharynx. Cardiovascular: regular rate and rhythm, pulses palpable throughout   Pulmonary: Unlabored breathing Abdomen: Soft Extremities: no joint deformities, effusion, or inflammation 1+ pedal edema Skin: warm and dry, no hyperpigmentation, vitiligo, or suspicious lesions  Neurological Examination:                                                                                               Mental Status: [name] is alert.  Speech minimally dysarthric with rumination. Able to follow simple commands. Cranial Nerves: II: Visual fields grossly normal, pupils are equal, round, reactive to light. III,IV, VI: Ptosis not present, extra-ocular muscle movements intact bilaterally V,VII: Smile is symmetric. Facial light touch intact  bilaterally VIII: Hearing diminished IX,X: Uvula and palate rise symmetrically XI: SCM and bilateral shoulder shrug strength 5/5 XII: Midline tongue extension Motor: Asterixis in the BUE. Holds all extremities antigravity. Right arm and leg drift present. Sensory: Pinprick and light touch intact throughout, bilaterally Deep Tendon Reflexes: diminished throughout Plantars: Right: mute   Left: downgoing Cerebellar: Finger-to-nose test without evidence of dysmetria. Gait: Deferred  Lab Results: Basic Metabolic Panel:  Recent Labs Lab 06/03/17 0847  NA 141  K 3.9  CL 102  GLUCOSE 197*  BUN 43*  CREATININE 2.00*    Liver Function Tests: No results for input(s): AST, ALT, ALKPHOS, BILITOT,  PROT, ALBUMIN in the last 168 hours. No results for input(s): LIPASE, AMYLASE in the last 168 hours. No results for input(s): AMMONIA in the last 168 hours.  CBC:  Recent Labs Lab 06/03/17 0842 06/03/17 0847  WBC 8.2  --   NEUTROABS 4.9  --   HGB 10.5* 11.9*  HCT 33.0* 35.0*  MCV 92.7  --   PLT 87*  --     Cardiac Enzymes: No results for input(s): CKTOTAL, CKMB, CKMBINDEX, TROPONINI in the last 168 hours.  Lipid Panel: No results for input(s): CHOL, TRIG, HDL, CHOLHDL, VLDL, LDLCALC in the last 168 hours.  CBG:  Recent Labs Lab 06/03/17 0846  GLUCAP 193*    Microbiology: Results for orders placed or performed during the hospital encounter of 04/28/17  Urine Culture     Status: Abnormal   Collection Time: 04/28/17  4:07 PM  Result Value Ref Range Status   Specimen Description URINE, RANDOM  Final   Special Requests NONE  Final   Culture >=100,000 COLONIES/mL ESCHERICHIA COLI (A)  Final   Report Status 05/01/2017 FINAL  Final   Organism ID, Bacteria ESCHERICHIA COLI (A)  Final      Susceptibility   Escherichia coli - MIC*    AMPICILLIN <=2 SENSITIVE Sensitive     CEFAZOLIN <=4 SENSITIVE Sensitive     CEFTRIAXONE <=1 SENSITIVE Sensitive     CIPROFLOXACIN >=4  RESISTANT Resistant     GENTAMICIN <=1 SENSITIVE Sensitive     IMIPENEM <=0.25 SENSITIVE Sensitive     NITROFURANTOIN <=16 SENSITIVE Sensitive     TRIMETH/SULFA <=20 SENSITIVE Sensitive     AMPICILLIN/SULBACTAM <=2 SENSITIVE Sensitive     PIP/TAZO <=4 SENSITIVE Sensitive     Extended ESBL NEGATIVE Sensitive     * >=100,000 COLONIES/mL ESCHERICHIA COLI    Coagulation Studies:  Recent Labs  06/03/17 0842  LABPROT 21.3*  INR 1.82    Imaging: Ct Head Code Stroke Wo Contrast  Result Date: 06/03/2017 CLINICAL DATA:  Code stroke. Facial droop that has resolved. Bilateral extremity weakness. EXAM: CT HEAD WITHOUT CONTRAST TECHNIQUE: Contiguous axial images were obtained from the base of the skull through the vertex without intravenous contrast. COMPARISON:  04/28/2017 FINDINGS: Brain: No evidence of acute infarction, hemorrhage, hydrocephalus, extra-axial collection or mass lesion/mass effect. Chronic small vessel ischemia with gliosis in the cerebral white matter and remote right thalamus lacunar infarct. Vascular: Atherosclerotic calcification.  No hyperdense vessel. Skull: No acute or aggressive finding.  Hyperostosis interna. Sinuses/Orbits: Negative Other:  Prelim results sent 06/03/2017  at 8:58 am to Dr. Wilford Corner. ASPECTS [facility] Stroke Program Early CT Score) Not scored with this symptomatology IMPRESSION: 1. No acute finding. 2. Chronic small vessel ischemia including remote right thalamus lacunar infarct. Electronically Signed   By: Marnee Spring M.D.   On: 06/03/2017 09:01     Assessment Ms. Behunin is an 83 year old female who presents today with right sided weakness and slurred speech. CT showed no acute abnormality. She does have several risk factors for stroke, and it is possible that she did have a stroke, her condition is more favored to be due to metabolic derangements.  Plan: 1) MRI when able 2) Correction of metabolic derangements 3) Optimize stroke risk factors 4) We will  continue to follow.   Leonel Ramsay PA-C [facility] [phone]  06/03/2017, 9:59 AM  Attending addendum Patient seen and examined in the emergency room as an acute code stroke. Briefly 83 year old woman with right-sided weakness and slurred speech.  On EMS arrival she had right face, left arm weakness. Along with slurred speech. On my evaluation in the emergency room, she had asterixis with some subtle vertical drift in the right arm and right leg.  NIH stroke scale- 1a Level of Conscious.: 1 1b LOC Questions: 1 1c LOC Commands: 0 2 Best Gaze: 0 3 Visual: 0 4 Facial Palsy: 0 5a Motor Arm - left: 0 5b Motor Arm - Right: 1 6a Motor Leg - Left: 0 6b Motor Leg - Right: 1 7 Limb Ataxia: 0 8 Sensory: 0 9 Best Language: 0 10 Dysarthria: 0 11 Extinct. and Inatten.: 0 TOTAL: 4  I have reviewed the history and physical as documented above by Bruna Potter, PA-C. I have made the at its to the note to reflect my plan.  Not a candidate for IV TPA-on DOAC Not a candidate for endovascular treatment-absence of cortical signs.  Milon Dikes, MD [facility] [phone]  If 7pm to 7am, please call on call as listed on AMION.

## 2017-06-03 NOTE — ED Notes (Signed)
ED Provider at bedside. 

## 2017-06-03 NOTE — Code Documentation (Signed)
83yo female arriving to [facility] via [facility] at [phone].  Patient from home where she lives with her daughter.  Patient was LKW at 0730 and was noticed at 0735 to have left facial droop and dysarthria.  EMS called and assessed right leg drift and left arm drift and activated a code stroke.  Stroke team at the bedside on patient arrival.  Labs drawn and patient cleared for CT by Dr. Clayborne Dana.  Patient to CT with team.  CT completed.  NIHSS 3, see documentation for details and code stroke times.  Patient stated the incorrect month with right leg drift and perseveration on exam.  Patient with h/o pacemaker and atrial fibrillation on Eliquis, last dose yesterday.  Patient is contraindicated for treatment with tPA d/t taking Eliquis dose within 48 hours.  No acute stroke treatment at this time.  Patient to have MRI per MD.  Bedside handoff with ED RN Chrislyn.

## 2017-06-03 NOTE — ED Provider Notes (Addendum)
[facility]-EMERGENCY DEPT Provider Note   CSN: [id number] Arrival date & time: 06/03/17  [id number]   An emergency department physician performed an initial assessment on this suspected stroke patient at 0839 (Jeneva Schweizer).  History   Chief Complaint Chief Complaint  Patient presents with  . Code Stroke    HPI Danielle Benitez is a 83 y.o. female.   Weakness  Primary symptoms include focal weakness. This is a new problem. The current episode started 1 to 2 hours ago. The problem has not changed since onset.There was right upper extremity and right lower extremity focality noted. There has been no fever.     Past Medical History:  Diagnosis Date  . Abnormality of gait   . Anemia, unspecified   . Anticoagulated- Eliquis 07/31/2015  . Anxiety state, unspecified   . Aortic stenosis    a. Mild by echo 08/2013, not seen on repeat echo 02/2017.  Marland Kitchen Bifascicular block    afib  . Carotid artery disease (HCC)    a. Carotid dopplers (9/17) with 40-59% LICA stenosis (of note, prior ABI normal).  . Chronic diastolic CHF (congestive heart failure) (HCC)    a. Dx 07/2015 - EF 35-40%, unclear etiology ? r/t afib or viral etiology. b. EF normal in 02/2017.  . CKD (chronic kidney disease), stage III   . Depressive disorder, not elsewhere classified   . DM (diabetes mellitus), type 2, uncontrolled, with renal complications (HCC) 01/08/2012  . Eczema 11/22/2013  . Esophageal dysmotility 08/05/2015  . Essential hypertension   . Female stress incontinence 05/06/2015  . Full dentures   . GERD (gastroesophageal reflux disease)   . Gout   . HOH (hard of hearing)   . Internal hemorrhoids without mention of complication   . Iron deficiency   . Lumbago   . Memory loss   . Mild CAD    a. Cath 2013: 20-30% prox RCA.  Marland Kitchen Myocardial infarction (HCC) 07/2015  . Nonspecific abnormal results of liver function study   . Obesity, unspecified   . Orthostatic hypotension   . Osteoarthritis   . Other and unspecified  hyperlipidemia   . PAF (paroxysmal atrial fibrillation) (HCC) 07/11/2015   a. prior DCCVs, amiodarone, anticoagulation.  . Pain in joint, shoulder region 11/22/2013   left   . Personal history of fall   . Presence of permanent cardiac pacemaker   . Pruritus 11/22/2013  . S/P cardiac pacemaker procedure 01/11/2012   a. for symptomatic bradycardia. Followed by Dr. Ladona Ridgel. Device changed out 2017 due to ppm infection.  . Stroke [facility])    a. evidence of prior strokes on MRI in 2018.  Marland Kitchen UTI (urinary tract infection) 04/2017  . Wears glasses     Patient Active Problem List   Diagnosis Date Noted  . Confusion 06/03/2017  . History of CVA (cerebrovascular accident)   . Hyponatremia   . Stage 3 chronic kidney disease   . AKI (acute kidney injury) (HCC)   . Acute blood loss anemia   . UTI (urinary tract infection) 04/28/2017  . Acute metabolic encephalopathy 04/28/2017  . Acute lower UTI 04/28/2017  . Diabetes mellitus with diabetic nephropathy (HCC) 04/28/2017  . Dyslipidemia associated with type 2 diabetes mellitus (HCC) 04/28/2017  . Chronic diastolic CHF (congestive heart failure) (HCC) 04/28/2017  . Altered mental status   . TIA (transient ischemic attack) 03/31/2017  . Thrombocytopenia (HCC) 03/31/2017  . Chest pain 02/14/2017  . Dysuria 09/21/2016  . Fall 06/25/2016  . Skin tear of elbow  without complication 06/24/2016  . Injury of left shoulder and upper arm 06/24/2016  . Unequal blood pressure in upper extremities 06/03/2016  . Cardiac pacemaker in situ 04/12/2016  . Cough 03/08/2016  . Risk for falls 03/05/2016  . Insomnia 01/21/2016  . Chronic systolic congestive heart failure, NYHA class 3 (HCC) 01/20/2016  . LFT elevation 11/21/2015  . Weakness 11/06/2015  . Dysphagia   . Esophageal dysmotility 08/05/2015  . Anticoagulated- Eliquis 07/31/2015  . Normochromic normocytic anemia 07/31/2015  . Atrial fibrillation - 07/11/15 by PPM interrogation, recurrent 10/19 after DCCV  10/14 07/18/2015  . Moderate mitral regurgitation 07/18/2015  . Moderate tricuspid regurgitation 07/18/2015  . History of pacemaker - Medtronic 07/18/2015  . CKD (chronic kidney disease), stage III   . Orthostatic hypotension   . Essential hypertension   . Pericardial effusion 07/15/2015  . Female stress incontinence 05/06/2015  . Knee pain 12/18/2014  . Low back pain 02/27/2014  . Obese 02/27/2014  . Depression 02/27/2014  . Pain in joint, shoulder region 11/22/2013  . Eczema 11/22/2013  . S/P cardiac pacemaker procedure, PPM Medtronic REVO placed 01/11/2012 01/11/2012  . COPD bronchitis by CXR 01/10/2012  . Dyslipidemia 01/10/2012  . Mild CAD - 20-30% RCA in 2013 01/10/2012  . Fatigue 01/10/2012  . Sinus bradycardia, HR as low As 35 01/10/2012  . Mild aortic stenosis 01/10/2012  . Chest wall pain 01/08/2012  . SOB (shortness of breath) 01/08/2012  . DM, type 2, with renal complications  01/08/2012    Past Surgical History:  Procedure Laterality Date  . CARDIAC CATHETERIZATION  12/03/2003   Dr Clarene Duke  . CARDIOVERSION N/A 07/25/2015   Procedure: CARDIOVERSION;  Surgeon: Pricilla Riffle, MD;  Location: [facility] ENDOSCOPY;  Service: Cardiovascular;  Laterality: N/A;  . CARDIOVERSION N/A 08/06/2015   Procedure: CARDIOVERSION;  Surgeon: Laurey Morale, MD;  Location: [facility] ENDOSCOPY;  Service: Cardiovascular;  Laterality: N/A;  . CATARACT EXTRACTION W/ INTRAOCULAR LENS  IMPLANT, BILATERAL Bilateral 03/2015-04/2015  . COLONOSCOPY     ?????  . EP IMPLANTABLE DEVICE N/A 04/15/2016   Procedure: Pacemaker Implant;  Surgeon: Marinus Maw, MD;  Location: [facility] INVASIVE CV LAB;  Service: Cardiovascular;  Laterality: N/A;  . ESOPHAGOGASTRODUODENOSCOPY N/A 08/08/2015   Procedure: ESOPHAGOGASTRODUODENOSCOPY (EGD);  Surgeon: Iva Boop, MD;  Location: [facility] ENDOSCOPY;  Service: Endoscopy;  Laterality: N/A;  . EXTREMITY CYST EXCISION     finger  . EYE SURGERY Bilateral    catartact  . INSERT / REPLACE /  REMOVE PACEMAKER    . LEFT HEART CATHETERIZATION WITH CORONARY ANGIOGRAM N/A 01/10/2012   Procedure: LEFT HEART CATHETERIZATION WITH CORONARY ANGIOGRAM;  Surgeon: Chrystie Nose, MD;  Location: [facility] CATH LAB;  Service: Cardiovascular;  Laterality: N/A;  . ORIF WRIST FRACTURE Right 04/09/2014   Procedure: OPEN REDUCTION INTERNAL FIXATION (ORIF) RIGHT DISPLACED  DISTAL RADIUS  FRACTURE;  Surgeon: Dominica Severin, MD;  Location: [facility];  Service: Orthopedics;  Laterality: Right;  . PACEMAKER LEAD REMOVAL  04/12/2016   TEMPORARY PACEMAKER INSERTION  . PACEMAKER LEAD REMOVAL  04/15/2016   Procedure: Pacemaker Lead Removal;  Surgeon: Marinus Maw, MD;  Location: [facility] INVASIVE CV LAB;  Service: Cardiovascular;;  . PACEMAKER LEAD REMOVAL N/A 04/12/2016   Procedure: PACEMAKER LEAD REMOVAL;  Surgeon: Marinus Maw, MD;  Location: [facility] OR;  Service: Cardiovascular;  Laterality: N/A;  . PERMANENT PACEMAKER INSERTION Left 01/11/2012   Procedure: PERMANENT PACEMAKER INSERTION;  Surgeon: Thurmon Fair, MD;  Location: [facility] CATH LAB;  Service: Cardiovascular;  Laterality: Left;  . TEE WITHOUT CARDIOVERSION N/A 07/25/2015   Procedure: TRANSESOPHAGEAL ECHOCARDIOGRAM (TEE);  Surgeon: Fay Records, MD;  Location: [facility] ENDOSCOPY;  Service: Cardiovascular;  Laterality: N/A;    OB History    No data available       Home Medications    Prior to Admission medications   Medication Sig Start Date End Date Taking? Authorizing Provider  albuterol (PROVENTIL HFA;VENTOLIN HFA) 108 (90 Base) MCG/ACT inhaler Inhale 2 puffs into the lungs every 4 (four) hours as needed for wheezing or shortness of breath (cough, shortness of breath or wheezing.). 10/15/16  Yes Elby Beck, FNP  allopurinol (ZYLOPRIM) 300 MG tablet TAKE '300MG'$  BY MOUTH DAILY 05/04/17  Yes Tonia Ghent, MD  amiodarone (PACERONE) 200 MG tablet TAKE '200MG'$  BY MOUTH DAILY 05/04/17  Yes Tonia Ghent, MD  apixaban (ELIQUIS) 2.5 MG TABS tablet TAKE  2.'5MG'$  BY MOUTH TWICE A DAY 05/04/17  Yes Tonia Ghent, MD  atorvastatin (LIPITOR) 20 MG tablet Take 1 tablet (20 mg total) by mouth daily at 6 PM. 04/06/17  Yes Velvet Bathe, MD  Blood Glucose Monitoring Suppl (ONE TOUCH ULTRA 2) w/Device KIT Check blood sugar twice a day and as directed Dx E11.21 05/24/17  Yes Tonia Ghent, MD  DULoxetine (CYMBALTA) 20 MG capsule TAKE '20MG'$  BY MOUTH EVERY NIGHT AT BEDTIME 05/04/17  Yes Tonia Ghent, MD  fluticasone [facility]) 50 MCG/ACT nasal spray Place 1 spray into both nostrils daily as needed for allergies or rhinitis.   Yes [provider]  furosemide (LASIX) 40 MG tablet Take 80 mg in the AM and 40 mg in the PM 05/13/17  Yes Bensimhon, Shaune Pascal, MD  GAS RELIEF 80 MG chewable tablet CHEW ONE TABLET BY MOUTH EVERY 6 HOURS AS NEEDED FOR GAS. 11/06/16  Yes Tonia Ghent, MD  glimepiride (AMARYL) 1 MG tablet TAKE '1MG'$  BY MOUTH EVERY MORNING WITH BREAKFAST 05/04/17  Yes Tonia Ghent, MD  glucose blood (ONE TOUCH ULTRA TEST) test strip CHECK BLOOD SUGAR TWICE A DAY AND AS DIRECTED;DX E11.21 05/24/17  Yes Tonia Ghent, MD  Insulin Glargine (LANTUS SOLOSTAR) 100 UNIT/ML Solostar Pen Inject 25-27 Units into the skin See admin instructions. Daily at 10pm. If blood sugar 100-150 give same units as last dose, if greater than 151 increase by 1 unit. Patient taking differently: Inject 16 Units into the skin See admin instructions. Daily at 10pm. If blood sugar 100-150 give same units as last dose, if greater than 151 increase by 1 unit. 05/04/17  Yes Tonia Ghent, MD  Insulin Pen Needle (PEN NEEDLES) 30G X 5 MM MISC Inject 1 Dose as directed daily. Use with insulin pen 01/27/17  Yes Tonia Ghent, MD  loratadine (CLARITIN CHILDRENS) 5 MG chewable tablet Chew 1 tablet (5 mg total) by mouth daily. 01/26/17  Yes Tonia Ghent, MD  metoprolol succinate (TOPROL-XL) 25 MG 24 hr tablet Take 1 tablet (25 mg total) by mouth 2 (two) times daily. 04/05/17  Yes  Velvet Bathe, MD  Multiple Vitamins-Minerals (ONE-A-DAY 50 PLUS PO) Take 1 capsule by mouth daily.   Yes [provider]  pantoprazole (PROTONIX) 40 MG tablet TAKE '40MG'$  BY MOUTH DAILY 05/04/17  Yes Tonia Ghent, MD  saxagliptin HCl (ONGLYZA) 2.5 MG TABS tablet TAKE 2.'5MG'$  BY MOUTH DAILY 05/04/17  Yes Tonia Ghent, MD  senna-docusate (SENOKOT-S) 8.6-50 MG tablet Take 1 tablet by mouth daily as needed for mild  constipation. 07/28/15  Yes Barrett, Evelene Croon, PA-C  traZODone (DESYREL) 50 MG tablet Take 0.5-1 tablets (25-50 mg total) by mouth at bedtime as needed for sleep. Don't take >'50mg'$  in a day. 01/21/16  Yes Tonia Ghent, MD    Family History Family History  Problem Relation Age of Onset  . Breast cancer Mother   . Cancer Mother   . Diabetes Daughter   . Hypertension Daughter   . Cancer Brother     Social History Social History  Substance Use Topics  . Smoking status: Never Smoker  . Smokeless tobacco: Never Used  . Alcohol use No     Allergies   Beta adrenergic blockers; Jardiance [empagliflozin]; and Spironolactone   Review of Systems Review of Systems  Unable to perform ROS: Mental status change  Neurological: Positive for focal weakness and weakness.     Physical Exam Updated Vital Signs BP 133/74   Pulse 69   Temp 97.6 F (36.4 C) (Axillary)   Resp 13   Ht '5\' 1"'$  (1.549 m)   Wt 95.1 kg (209 lb 10.5 oz)   SpO2 98%   BMI 39.61 kg/m   Physical Exam  Constitutional: She is oriented to person, place, and time. She appears well-developed and well-nourished.  HENT:  Head: Normocephalic and atraumatic.  Eyes: Conjunctivae and EOM are normal.  Neck: Normal range of motion.  Cardiovascular: Normal rate and regular rhythm.   Pulmonary/Chest: No stridor. No respiratory distress.  Abdominal: Soft. She exhibits no distension.  Musculoskeletal: Normal range of motion. She exhibits no edema or deformity.  Neurological: She is alert and oriented to  person, place, and time. No cranial nerve deficit.  Right sided arm and leg weakness Inability to tell date/time/situation   Skin: Skin is warm and dry.  Nursing note and vitals reviewed.    ED Treatments / Results  Labs (all labs ordered are listed, but only abnormal results are displayed) Labs Reviewed  PROTIME-INR - Abnormal; Notable for the following:       Result Value   Prothrombin Time 21.3 (*)    All other components within normal limits  CBC - Abnormal; Notable for the following:    RBC 3.56 (*)    Hemoglobin 10.5 (*)    HCT 33.0 (*)    RDW 19.0 (*)    Platelets 87 (*)    All other components within normal limits  COMPREHENSIVE METABOLIC PANEL - Abnormal; Notable for the following:    CO2 20 (*)    Glucose, Bld 195 (*)    BUN 30 (*)    Creatinine, Ser 2.07 (*)    Calcium 8.7 (*)    Albumin 2.6 (*)    AST 62 (*)    Alkaline Phosphatase 127 (*)    GFR calc non Af Amer 21 (*)    GFR calc Af Amer 24 (*)    Anion gap 17 (*)    All other components within normal limits  URINALYSIS, ROUTINE W REFLEX MICROSCOPIC - Abnormal; Notable for the following:    APPearance CLOUDY (*)    Leukocytes, UA MODERATE (*)    Bacteria, UA FEW (*)    Squamous Epithelial / LPF 6-30 (*)    Non Squamous Epithelial 0-5 (*)    All other components within normal limits  BRAIN NATRIURETIC PEPTIDE - Abnormal; Notable for the following:    B Natriuretic Peptide 303.4 (*)    All other components within normal limits  CBG MONITORING, ED - Abnormal;  Notable for the following:    Glucose-Capillary 193 (*)    All other components within normal limits  I-STAT CHEM 8, ED - Abnormal; Notable for the following:    BUN 43 (*)    Creatinine, Ser 2.00 (*)    Glucose, Bld 197 (*)    Calcium, Ion 1.02 (*)    Hemoglobin 11.9 (*)    HCT 35.0 (*)    All other components within normal limits  URINE CULTURE  URINE CULTURE  APTT  DIFFERENTIAL  MAGNESIUM  PHOSPHORUS  TSH  BASIC METABOLIC PANEL  CBC   VITAMIN B12  I-STAT TROPONIN, ED    EKG  EKG Interpretation  Date/Time:  Friday June 03 2017 09:06:48 EDT Ventricular Rate:  70 PR Interval:    QRS Duration: 176 QT Interval:  486 QTC Calculation: 525 R Axis:   -65 Text Interpretation:  Sinus rhythm RBBB and LAFB Probable left ventricular hypertrophy No significant change since last tracing Confirmed by Merrily Pew [phone]) on 06/03/2017 11:02:10 AM       Radiology Dg Chest 2 View  Result Date: 06/03/2017 CLINICAL DATA:  83 year old female with weakness. EXAM: CHEST  2 VIEW COMPARISON:  03/31/2017 FINDINGS: Right-sided 2 lead pacer device appears in stable position. The cardiomediastinal silhouette is enlarged but stable. Lung volumes are low. No focal opacities, sizeable effusions or pneumothorax. No acute osseous abnormalities. IMPRESSION: 1. Low lung volumes without active cardiopulmonary disease. 2. Stable cardiomegaly. Electronically Signed   By: Kristopher Oppenheim M.D.   On: 06/03/2017 10:48   Mr Brain Wo Contrast  Result Date: 06/03/2017 CLINICAL DATA:  Left facial droop and slurred speech. EXAM: MRI HEAD WITHOUT CONTRAST TECHNIQUE: Multiplanar, multiecho pulse sequences of the brain and surrounding structures were obtained without intravenous contrast. COMPARISON:  Head CT 06/03/2017 and MRI 04/29/2017 FINDINGS: The study is mildly motion degraded. Brain: There is no evidence of acute infarct, intracranial hemorrhage, mass, midline shift, or extra-axial fluid collection. The ventricles and sulci are within normal limits for age. Patchy T2 hyperintensities in the cerebral white matter are unchanged from the prior MRI and nonspecific but compatible with mild-to-moderate chronic small vessel ischemic disease. Chronic lacunar infarcts are again noted in the right basal ganglia, right thalamus, and cerebellum. Vascular: Major intracranial vascular flow voids are preserved. Skull and upper cervical spine: Unremarkable bone marrow signal.  Sinuses/Orbits: Bilateral cataract extraction. Trace left mastoid fluid. Clear paranasal sinuses. Other: None. IMPRESSION: 1. No acute intracranial abnormality. 2. Mild-to-moderate chronic small vessel ischemic disease. Electronically Signed   By: Logan Bores M.D.   On: 06/03/2017 17:17   Ct Head Code Stroke Wo Contrast  Result Date: 06/03/2017 CLINICAL DATA:  Code stroke. Facial droop that has resolved. Bilateral extremity weakness. EXAM: CT HEAD WITHOUT CONTRAST TECHNIQUE: Contiguous axial images were obtained from the base of the skull through the vertex without intravenous contrast. COMPARISON:  04/28/2017 FINDINGS: Brain: No evidence of acute infarction, hemorrhage, hydrocephalus, extra-axial collection or mass lesion/mass effect. Chronic small vessel ischemia with gliosis in the cerebral white matter and remote right thalamus lacunar infarct. Vascular: Atherosclerotic calcification.  No hyperdense vessel. Skull: No acute or aggressive finding.  Hyperostosis interna. Sinuses/Orbits: Negative Other:  Prelim results sent 06/03/2017  at 8:58 am to Dr. Rory Percy. ASPECTS [facility] Stroke Program Early CT Score) Not scored with this symptomatology IMPRESSION: 1. No acute finding. 2. Chronic small vessel ischemia including remote right thalamus lacunar infarct. Electronically Signed   By: Monte Fantasia M.D.   On: 06/03/2017 09:01  Procedures Procedures (including critical care time)  CRITICAL CARE Performed by: Merrily Pew Total critical care time: 35 minutes Critical care time was exclusive of separately billable procedures and treating other patients. Critical care was necessary to treat or prevent imminent or life-threatening deterioration. Critical care was time spent personally by me on the following activities: development of treatment plan with patient and/or surrogate as well as nursing, discussions with consultants, evaluation of patient's response to treatment, examination of patient,  obtaining history from patient or surrogate, ordering and performing treatments and interventions, ordering and review of laboratory studies, ordering and review of radiographic studies, pulse oximetry and re-evaluation of patient's condition.   Medications Ordered in ED Medications  cefTRIAXone (ROCEPHIN) 1 g in dextrose 5 % 50 mL IVPB (not administered)  allopurinol (ZYLOPRIM) tablet 300 mg (not administered)  apixaban (ELIQUIS) tablet 2.5 mg (not administered)  DULoxetine (CYMBALTA) DR capsule 20 mg (not administered)  amiodarone (PACERONE) tablet 200 mg (not administered)  Insulin Glargine (LANTUS) Solostar Pen 12 Units (not administered)  pantoprazole (PROTONIX) EC tablet 40 mg (not administered)  atorvastatin (LIPITOR) tablet 20 mg (not administered)  metoprolol succinate (TOPROL-XL) 24 hr tablet 12.5 mg (not administered)  loratadine (CLARITIN) chewable tablet 5 mg (not administered)  albuterol (PROVENTIL HFA;VENTOLIN HFA) 108 (90 Base) MCG/ACT inhaler 2 puff (not administered)  fluticasone (FLONASE) 50 MCG/ACT nasal spray 1 spray (not administered)  traZODone (DESYREL) tablet 25 mg (not administered)  senna-docusate (Senokot-S) tablet 1 tablet (not administered)  sodium chloride flush (NS) 0.9 % injection 3 mL (not administered)  0.9 %  sodium chloride infusion (not administered)  sodium chloride flush (NS) 0.9 % injection 3 mL (not administered)  sodium chloride flush (NS) 0.9 % injection 3 mL (not administered)  0.9 %  sodium chloride infusion (not administered)  acetaminophen (TYLENOL) tablet 650 mg (not administered)    Or  acetaminophen (TYLENOL) suppository 650 mg (not administered)  ondansetron (ZOFRAN) tablet 4 mg (not administered)    Or  ondansetron (ZOFRAN) injection 4 mg (not administered)  cefTRIAXone (ROCEPHIN) 1 g in dextrose 5 % 50 mL IVPB (0 g Intravenous Stopped 06/03/17 1519)     Initial Impression / Assessment and Plan / ED Course  I have reviewed the  triage vital signs and the nursing notes.  Pertinent labs & imaging results that were available during my care of the patient were reviewed by me and considered in my medical decision making (see chart for details).     Code stroke, no tPA secondary to encephalopathy/metabolic derangement as most likely cause along with being on eliquis.  Ended up with UTI. Will admit for AMS and infection. Rocephin given. Culture added on.   Final Clinical Impressions(s) / ED Diagnoses   Final diagnoses:  Slurred speech  Urinary tract infection without hematuria, site unspecified       Jaythen Hamme, Corene Cornea, MD 06/03/17 1842

## 2017-06-03 NOTE — ED Notes (Signed)
Patient transported to MRI 

## 2017-06-03 NOTE — Telephone Encounter (Signed)
Please verify recent Lasix dose with [name]. I thought she was taking 80 mg in morning and 40 mg in the afternoon. Please give the order for home health.  Thanks.

## 2017-06-03 NOTE — H&P (Signed)
History and Physical    Danielle Benitez DJS:[id number] DOB: 05/11/1932 DOA: 06/03/2017  PCP: Tonia Ghent, MD   I have briefly reviewed patients previous medical reports in [facility].  Patient coming from: from home  Chief Complaint: confusion/AMS and left facial droop.  HPI: Danielle Benitez is a 83 y/o female with PMH significant for DM type, CKD stage 3, chronic diastolic heart failure, HLD, HTN. A. Fib (on eliquis) and prior hx of stroke ( apparently w/o residual deficit); who came via EMS due to acute change in mentation, slurred speech and left facial droop. Patient symptoms started aprox 2 hours prior to presentation to ED; patient found not to be a candidate for TPA as she is chronically on anticoagulation. Family also reported increase frequency and incontinence; reporting as well that she experience this symptoms when ever she had a UTI. No fever, no CP, no nausea, no vomiting, no hematuria, no melena.  ED Course: patient had CT scan of the head that r/o hemorrhagic stroke; also had EKG, UA and urine culture. Empirically received one dose of rocephin. Neurology and [facility] consulted.  Review of Systems:  All other systems reviewed and apart from HPI, are negative. Patient confused and not participating much.  Past Medical History:  Diagnosis Date  . Abnormality of gait   . Anemia, unspecified   . Anticoagulated- Eliquis 07/31/2015  . Anxiety state, unspecified   . Aortic stenosis    a. Mild by echo 08/2013, not seen on repeat echo 02/2017.  Marland Kitchen Bifascicular block    afib  . Carotid artery disease (HCC)    a. Carotid dopplers (8/85) with 02-77% LICA stenosis (of note, prior ABI normal).  . Chronic diastolic CHF (congestive heart failure) ([place])    a. Dx 07/2015 - EF 35-40%, unclear etiology ? r/t afib or viral etiology. b. EF normal in 02/2017.  . CKD (chronic kidney disease), stage III   . Depressive disorder, not elsewhere classified   . DM (diabetes mellitus), type 2,  uncontrolled, with renal complications ([place]) 01/20/8785  . Eczema 11/22/2013  . Esophageal dysmotility 08/05/2015  . Essential hypertension   . Female stress incontinence 05/06/2015  . Full dentures   . GERD (gastroesophageal reflux disease)   . Gout   . HOH (hard of hearing)   . Internal hemorrhoids without mention of complication   . Iron deficiency   . Lumbago   . Memory loss   . Mild CAD    a. Cath 2013: 20-30% prox RCA.  Marland Kitchen Myocardial infarction ([place]) 07/2015  . Nonspecific abnormal results of liver function study   . Obesity, unspecified   . Orthostatic hypotension   . Osteoarthritis   . Other and unspecified hyperlipidemia   . PAF (paroxysmal atrial fibrillation) ([place]) 07/11/2015   a. prior DCCVs, amiodarone, anticoagulation.  . Pain in joint, shoulder region 11/22/2013   left   . Personal history of fall   . Presence of permanent cardiac pacemaker   . Pruritus 11/22/2013  . S/P cardiac pacemaker procedure 01/11/2012   a. for symptomatic bradycardia. Followed by Dr. Lovena Le. Device changed out 2017 due to ppm infection.  . Stroke [facility])    a. evidence of prior strokes on MRI in 2018.  Marland Kitchen UTI (urinary tract infection) 04/2017  . Wears glasses     Past Surgical History:  Procedure Laterality Date  . CARDIAC CATHETERIZATION  12/03/2003   Dr Rex Kras  . CARDIOVERSION N/A 07/25/2015   Procedure: CARDIOVERSION;  Surgeon: Nevin Bloodgood  Alcus Dad, MD;  Location: [facility] ENDOSCOPY;  Service: Cardiovascular;  Laterality: N/A;  . CARDIOVERSION N/A 08/06/2015   Procedure: CARDIOVERSION;  Surgeon: Larey Dresser, MD;  Location: [place];  Service: Cardiovascular;  Laterality: N/A;  . CATARACT EXTRACTION W/ INTRAOCULAR LENS  IMPLANT, BILATERAL Bilateral 03/2015-04/2015  . COLONOSCOPY     ?????  . EP IMPLANTABLE DEVICE N/A 04/15/2016   Procedure: Pacemaker Implant;  Surgeon: Evans Lance, MD;  Location: [place] CV LAB;  Service: Cardiovascular;  Laterality: N/A;  . ESOPHAGOGASTRODUODENOSCOPY N/A  08/08/2015   Procedure: ESOPHAGOGASTRODUODENOSCOPY (EGD);  Surgeon: Gatha Mayer, MD;  Location: [facility] ENDOSCOPY;  Service: Endoscopy;  Laterality: N/A;  . EXTREMITY CYST EXCISION     finger  . EYE SURGERY Bilateral    catartact  . INSERT / REPLACE / REMOVE PACEMAKER    . LEFT HEART CATHETERIZATION WITH CORONARY ANGIOGRAM N/A 01/10/2012   Procedure: LEFT HEART CATHETERIZATION WITH CORONARY ANGIOGRAM;  Surgeon: Pixie Casino, MD;  Location: [facility] CATH LAB;  Service: Cardiovascular;  Laterality: N/A;  . ORIF WRIST FRACTURE Right 04/09/2014   Procedure: OPEN REDUCTION INTERNAL FIXATION (ORIF) RIGHT DISPLACED  DISTAL RADIUS  FRACTURE;  Surgeon: Roseanne Kaufman, MD;  Location: [place];  Service: Orthopedics;  Laterality: Right;  . PACEMAKER LEAD REMOVAL  04/12/2016   TEMPORARY PACEMAKER INSERTION  . PACEMAKER LEAD REMOVAL  04/15/2016   Procedure: Pacemaker Lead Removal;  Surgeon: Evans Lance, MD;  Location: [place] CV LAB;  Service: Cardiovascular;;  . PACEMAKER LEAD REMOVAL N/A 04/12/2016   Procedure: PACEMAKER LEAD REMOVAL;  Surgeon: Evans Lance, MD;  Location: [place];  Service: Cardiovascular;  Laterality: N/A;  . PERMANENT PACEMAKER INSERTION Left 01/11/2012   Procedure: PERMANENT PACEMAKER INSERTION;  Surgeon: Sanda Klein, MD;  Location: [place] CATH LAB;  Service: Cardiovascular;  Laterality: Left;  . TEE WITHOUT CARDIOVERSION N/A 07/25/2015   Procedure: TRANSESOPHAGEAL ECHOCARDIOGRAM (TEE);  Surgeon: Fay Records, MD;  Location: [facility] ENDOSCOPY;  Service: Cardiovascular;  Laterality: N/A;    Social History  reports that she has never smoked. She has never used smokeless tobacco. She reports that she does not drink alcohol or use drugs.  Allergies  Allergen Reactions  . Beta Adrenergic Blockers Other (See Comments)    Couldn't speak or hear Metoprolol seems ok  . Jardiance [Empagliflozin] Other (See Comments)    Rash, kidney issues  . Spironolactone Other (See Comments)     Hyperkalemia    Family History  Problem Relation Age of Onset  . Breast cancer Mother   . Cancer Mother   . Diabetes Daughter   . Hypertension Daughter   . Cancer Brother    as per record review and family at bedside confirmation. Patient confused.   Prior to Admission medications   Medication Sig Start Date End Date Taking? Authorizing Provider  albuterol (PROVENTIL HFA;VENTOLIN HFA) 108 (90 Base) MCG/ACT inhaler Inhale 2 puffs into the lungs every 4 (four) hours as needed for wheezing or shortness of breath (cough, shortness of breath or wheezing.). 10/15/16  Yes Elby Beck, FNP  allopurinol (ZYLOPRIM) 300 MG tablet TAKE '300MG'$  BY MOUTH DAILY 05/04/17  Yes Tonia Ghent, MD  amiodarone (PACERONE) 200 MG tablet TAKE '200MG'$  BY MOUTH DAILY 05/04/17  Yes Tonia Ghent, MD  apixaban (ELIQUIS) 2.5 MG TABS tablet TAKE 2.'5MG'$  BY MOUTH TWICE A DAY 05/04/17  Yes Tonia Ghent, MD  atorvastatin (LIPITOR) 20 MG tablet Take 1 tablet (20 mg total) by  mouth daily at 6 PM. 04/06/17  Yes Velvet Bathe, MD  Blood Glucose Monitoring Suppl (ONE TOUCH ULTRA 2) w/Device KIT Check blood sugar twice a day and as directed Dx E11.21 05/24/17  Yes Tonia Ghent, MD  DULoxetine (CYMBALTA) 20 MG capsule TAKE '20MG'$  BY MOUTH EVERY NIGHT AT BEDTIME 05/04/17  Yes Tonia Ghent, MD  fluticasone [facility]) 50 MCG/ACT nasal spray Place 1 spray into both nostrils daily as needed for allergies or rhinitis.   Yes [provider]  furosemide (LASIX) 40 MG tablet Take 80 mg in the AM and 40 mg in the PM 05/13/17  Yes Bensimhon, Shaune Pascal, MD  GAS RELIEF 80 MG chewable tablet CHEW ONE TABLET BY MOUTH EVERY 6 HOURS AS NEEDED FOR GAS. 11/06/16  Yes Tonia Ghent, MD  glimepiride (AMARYL) 1 MG tablet TAKE '1MG'$  BY MOUTH EVERY MORNING WITH BREAKFAST 05/04/17  Yes Tonia Ghent, MD  glucose blood (ONE TOUCH ULTRA TEST) test strip CHECK BLOOD SUGAR TWICE A DAY AND AS DIRECTED;DX E11.21 05/24/17  Yes Tonia Ghent,  MD  Insulin Glargine (LANTUS SOLOSTAR) 100 UNIT/ML Solostar Pen Inject 25-27 Units into the skin See admin instructions. Daily at 10pm. If blood sugar 100-150 give same units as last dose, if greater than 151 increase by 1 unit. Patient taking differently: Inject 16 Units into the skin See admin instructions. Daily at 10pm. If blood sugar 100-150 give same units as last dose, if greater than 151 increase by 1 unit. 05/04/17  Yes Tonia Ghent, MD  Insulin Pen Needle (PEN NEEDLES) 30G X 5 MM MISC Inject 1 Dose as directed daily. Use with insulin pen 01/27/17  Yes Tonia Ghent, MD  loratadine (CLARITIN CHILDRENS) 5 MG chewable tablet Chew 1 tablet (5 mg total) by mouth daily. 01/26/17  Yes Tonia Ghent, MD  metoprolol succinate (TOPROL-XL) 25 MG 24 hr tablet Take 1 tablet (25 mg total) by mouth 2 (two) times daily. 04/05/17  Yes Velvet Bathe, MD  Multiple Vitamins-Minerals (ONE-A-DAY 50 PLUS PO) Take 1 capsule by mouth daily.   Yes [provider]  pantoprazole (PROTONIX) 40 MG tablet TAKE '40MG'$  BY MOUTH DAILY 05/04/17  Yes Tonia Ghent, MD  saxagliptin HCl (ONGLYZA) 2.5 MG TABS tablet TAKE 2.'5MG'$  BY MOUTH DAILY 05/04/17  Yes Tonia Ghent, MD  senna-docusate (SENOKOT-S) 8.6-50 MG tablet Take 1 tablet by mouth daily as needed for mild constipation. 07/28/15  Yes Barrett, Evelene Croon, PA-C  traZODone (DESYREL) 50 MG tablet Take 0.5-1 tablets (25-50 mg total) by mouth at bedtime as needed for sleep. Don't take >'50mg'$  in a day. 01/21/16  Yes Tonia Ghent, MD    Physical Exam: Vitals:   06/03/17 1415 06/03/17 1445 06/03/17 1500 06/03/17 1515  BP: 119/62 (!) 113/59 (!) 144/69 (!) 130/57  Pulse: 70 70  70  Resp: '13 16 14 14  '$ Temp:      TempSrc:      SpO2: 98% 99%  99%  Weight:      Height:        Constitutional: essentially oriented to person and intermittently following simple commands. Poor insight and some slurred speech appreciated. No fever. Eyes: PERTLA, lids and  conjunctivae normal, no icterus ENMT: Mucous membranes are moist. Posterior pharynx clear of any exudate or lesions. No thrush Neck: supple, no masses, no thyromegaly, no JVD Respiratory: clear to auscultation bilaterally, no wheezing, no crackles. Normal respiratory effort. No accessory muscle use.  Cardiovascular: regular rate; no  rubs, no gallops, S1 and S2 heard. Abdomen: No distension, no tenderness, no masses palpated. No hepatosplenomegaly. Bowel sounds normal.  Musculoskeletal: no clubbing / cyanosis. No joint deformity upper and lower extremities.  Neurologic: CN intact, moving four limbs spontaneously, right side upper and lower extremities with 4/5 MS; also mild left facial droop seen.   Psychiatric: poor insight, oriented X1, stable mood.   Labs on Admission: I have personally reviewed following labs and imaging studies  CBC:  Recent Labs Lab 06/03/17 0842 06/03/17 0847  WBC 8.2  --   NEUTROABS 4.9  --   HGB 10.5* 11.9*  HCT 33.0* 35.0*  MCV 92.7  --   PLT 87*  --    Basic Metabolic Panel:  Recent Labs Lab 06/03/17 0842 06/03/17 0847  NA 140 141  K 3.9 3.9  CL 103 102  CO2 20*  --   GLUCOSE 195* 197*  BUN 30* 43*  CREATININE 2.07* 2.00*  CALCIUM 8.7*  --    Liver Function Tests:  Recent Labs Lab 06/03/17 0842  AST 62*  ALT QUANTITY NOT SUFFICIENT, UNABLE TO PERFORM TEST  ALKPHOS 127*  BILITOT QUANTITY NOT SUFFICIENT, UNABLE TO PERFORM TEST  PROT 6.8  ALBUMIN 2.6*   Coagulation Profile:  Recent Labs Lab 06/03/17 0842  INR 1.82   CBG:  Recent Labs Lab 06/03/17 0846  GLUCAP 193*   Urine analysis:    Component Value Date/Time   COLORURINE YELLOW 06/03/2017 1257   APPEARANCEUR CLOUDY (A) 06/03/2017 1257   LABSPEC 1.013 06/03/2017 1257   PHURINE 6.0 06/03/2017 1257   GLUCOSEU NEGATIVE 06/03/2017 1257   HGBUR NEGATIVE 06/03/2017 1257   [place] 06/03/2017 1257   BILIRUBINUR 1+ 09/20/2016 1424   [place]  06/03/2017 1257   PROTEINUR NEGATIVE 06/03/2017 1257   UROBILINOGEN 0.2 09/20/2016 1424   UROBILINOGEN 0.2 08/02/2015 0330   NITRITE NEGATIVE 06/03/2017 1257   LEUKOCYTESUR MODERATE (A) 06/03/2017 1257     Radiological Exams on Admission: Dg Chest 2 View  Result Date: 06/03/2017 CLINICAL DATA:  83 year old female with weakness. EXAM: CHEST  2 VIEW COMPARISON:  03/31/2017 FINDINGS: Right-sided 2 lead pacer device appears in stable position. The cardiomediastinal silhouette is enlarged but stable. Lung volumes are low. No focal opacities, sizeable effusions or pneumothorax. No acute osseous abnormalities. IMPRESSION: 1. Low lung volumes without active cardiopulmonary disease. 2. Stable cardiomegaly. Electronically Signed   By: Kristopher Oppenheim M.D.   On: 06/03/2017 10:48   Ct Head Code Stroke Wo Contrast  Result Date: 06/03/2017 CLINICAL DATA:  Code stroke. Facial droop that has resolved. Bilateral extremity weakness. EXAM: CT HEAD WITHOUT CONTRAST TECHNIQUE: Contiguous axial images were obtained from the base of the skull through the vertex without intravenous contrast. COMPARISON:  04/28/2017 FINDINGS: Brain: No evidence of acute infarction, hemorrhage, hydrocephalus, extra-axial collection or mass lesion/mass effect. Chronic small vessel ischemia with gliosis in the cerebral white matter and remote right thalamus lacunar infarct. Vascular: Atherosclerotic calcification.  No hyperdense vessel. Skull: No acute or aggressive finding.  Hyperostosis interna. Sinuses/Orbits: Negative Other:  Prelim results sent 06/03/2017  at 8:58 am to Dr. Rory Percy. ASPECTS [facility] Stroke Program Early CT Score) Not scored with this symptomatology IMPRESSION: 1. No acute finding. 2. Chronic small vessel ischemia including remote right thalamus lacunar infarct. Electronically Signed   By: Monte Fantasia M.D.   On: 06/03/2017 09:01    EKG: independently reviewed. No acute ischemic changes seen. Normal axis, regular rate.  RBBB unchanged from prior tracing.  Assessment/Plan 1-confusion/AMS -with concerns for toxic encephalopathy VS TIA VS stroke -patient with mild neurologic changes (slurred speech and right side weakness); prior hx of stroke and usually presenting this type of neurologic abnormalities with acute UTI. -no fever, normal WBC's and no dysuria. -family endorses increase frequency, but has also using higher dosages of lasix lately -CXR w/o acute abnormalities, CT head with just chonic lacunar infarct and no acute intracranial abnormalities -will monitor on telemetry, check TSH, B12 and repeat MRI -will check urine cx, as UA is demonstrating positive LA; and treat her with empiric rocephin -will follow clinical response -NPO except for sips with meds until swallowing test done.  2-DM, type 2, with renal complications  -recent J7P 8.3 -will continue lantus and SSI; lantus dose decrease as patient NPO. -CBG Q4H while NPO  3-Essential hypertension -will stop home meds except for her metoprolol; and that one will be cut in half  4-Weakness -unknown baseline -will have PT/OT working with her   5-Chronic diastolic congestive heart failure -last EF 60-65% -will follow daily weight and strict I's and O's -currently compensated -will resume home diuretics in am -continue metoprolol  6-UTI (urinary tract infection) -as mentioned above will cover with empiric rocephin -follow urine culture  7-Stage 3 chronic kidney disease -stable and at baseline currently  -will follow trend   Time: 70 minutes  DVT prophylaxis: on full anticoagulation.  Code Status: Full Family Communication: daughter at bedside  Disposition Plan: to be determined; but anticipate discharge back home with [facility] services. Consults called: neurology  Admission status: observation, LOS < 2 midnights, telemetry bed    Barton Dubois MD [facility] Pager [phone]  If 7PM-7AM, please contact  night-coverage [email or web address] Password [email or web address]  06/03/2017, 5:10 PM

## 2017-06-03 NOTE — ED Notes (Signed)
Pt CBG was 193, David(EMT) notified Chrislyn(RN)

## 2017-06-03 NOTE — Telephone Encounter (Signed)
Dosage and HH aide orders left on VM of Danielle Benitez with [facility].

## 2017-06-03 NOTE — ED Triage Notes (Signed)
Pt arrives from home via [facility] reporting new onset L facial droop with LLE weakness around 0735 today.  LSN 0730.  [name] daughter reports pt has been sick past week with URI and sore throat, denies fever, chills.  Pt c/o general malaise at this time. Per pt's daughter pt more confused than baseline, usually oriented to time. Resp e/u, pt somnolent.

## 2017-06-04 DIAGNOSIS — E1121 Type 2 diabetes mellitus with diabetic nephropathy: Secondary | ICD-10-CM | POA: Diagnosis not present

## 2017-06-04 DIAGNOSIS — E1165 Type 2 diabetes mellitus with hyperglycemia: Secondary | ICD-10-CM | POA: Diagnosis not present

## 2017-06-04 DIAGNOSIS — N390 Urinary tract infection, site not specified: Secondary | ICD-10-CM | POA: Diagnosis not present

## 2017-06-04 DIAGNOSIS — I5022 Chronic systolic (congestive) heart failure: Secondary | ICD-10-CM | POA: Diagnosis not present

## 2017-06-04 DIAGNOSIS — R4781 Slurred speech: Secondary | ICD-10-CM | POA: Diagnosis not present

## 2017-06-04 DIAGNOSIS — N183 Chronic kidney disease, stage 3 (moderate): Secondary | ICD-10-CM | POA: Diagnosis not present

## 2017-06-04 LAB — URINE CULTURE

## 2017-06-04 LAB — BASIC METABOLIC PANEL
Anion gap: 10 (ref 5–15)
BUN: 25 mg/dL — AB (ref 6–20)
CALCIUM: 8.6 mg/dL — AB (ref 8.9–10.3)
CO2: 27 mmol/L (ref 22–32)
CREATININE: 1.7 mg/dL — AB (ref 0.44–1.00)
Chloride: 105 mmol/L (ref 101–111)
GFR calc non Af Amer: 27 mL/min — ABNORMAL LOW (ref >60)
GFR, EST AFRICAN AMERICAN: 31 mL/min — AB (ref >60)
GLUCOSE: 114 mg/dL — AB (ref 65–99)
Potassium: 3.1 mmol/L — ABNORMAL LOW (ref 3.5–5.1)
Sodium: 142 mmol/L (ref 135–145)

## 2017-06-04 LAB — CBC
HCT: 30.2 % — ABNORMAL LOW (ref 36.0–46.0)
Hemoglobin: 9.3 g/dL — ABNORMAL LOW (ref 12.0–15.0)
MCH: 28.3 pg (ref 26.0–34.0)
MCHC: 30.8 g/dL (ref 30.0–36.0)
MCV: 91.8 fL (ref 78.0–100.0)
PLATELETS: 95 10*3/uL — AB (ref 150–400)
RBC: 3.29 MIL/uL — AB (ref 3.87–5.11)
RDW: 18.9 % — AB (ref 11.5–15.5)
WBC: 7.1 10*3/uL (ref 4.0–10.5)

## 2017-06-04 LAB — GLUCOSE, CAPILLARY: GLUCOSE-CAPILLARY: 118 mg/dL — AB (ref 65–99)

## 2017-06-04 MED ORDER — CEPHALEXIN 500 MG PO CAPS: 500.0000 mg | ORAL_CAPSULE | Freq: Two times a day (BID) | ORAL | 0 refills | Status: DC

## 2017-06-04 NOTE — Progress Notes (Signed)
Patient arrived to 5M10 from [facility]. Patient alert and oriented x 3 (disoriented to time). Vitals taken and stable. Patient failed swallow screen in ED. On-call Md notified that patient could not take daily dose of Eliquis because of NPO. Telemetry monitor placed on patient. Family at bedside. Patient and family oriented to room and call-light.

## 2017-06-04 NOTE — Evaluation (Signed)
Occupational Therapy Evaluation and Discharge Patient Details Name: EVIAN MERISIER MRN: [id number] DOB: 1932/09/14 Today's Date: 06/04/2017    History of Present Illness NICA BENEDICT is a 83 y/o female with PMH significant for DM type, CKD stage 3, chronic diastolic heart failure, HLD, HTN. A. Fib (on eliquis) and prior hx of stroke ( apparently w/o residual deficit); who came via EMS due to acute change in mentation, slurred speech and left facial droop. Patient symptoms started aprox 2 hours prior to presentation to ED; patient found not to be a candidate for TPA as she is chronically on anticoagulation. concerns for toxic encephalopathy VS TIA VS stroke. MRI negative   Clinical Impression   PTA pt getting HHPT and an aide twice a week for ADL, mobility with rollator. Pt with lots of recent falls, mainly when she walks away from DME per daughter. Pt is currently back at baseline per family. OT assisted with in room ambulation to bathroom, peri care, and grooming in addition to LB dressing and energy conservation education. After lengthy discussion with family and Pt, even though she would benefit from [facility] they are going to decline and continue with HHPT and aide only. They feel comfortable and happy with this. Pt and family with no other questions or concerns for OT at this time, education complete, OT to sign off. Thank you for the opportunity to serve this patient.     Follow Up Recommendations  Supervision/Assistance - 24 hour    Equipment Recommendations  None recommended by OT (Pt has appropriate DME)    Recommendations for Other Services       Precautions / Restrictions Precautions Precautions: Fall Precaution Comments: history of falls Restrictions Weight Bearing Restrictions: No      Mobility Bed Mobility Overal bed mobility: Needs Assistance Bed Mobility: Sit to Supine       Sit to supine: Min guard   General bed mobility comments: min guard for safety, no assist needed  - she has a bed that raises up and down at home  Transfers Overall transfer level: Needs assistance Equipment used: Rolling walker (2 wheeled) Transfers: Sit to/from Stand Sit to Stand: Min assist         General transfer comment: vc for safe hand placement, min A for boost and balance    Balance Overall balance assessment: Needs assistance Sitting-balance support: No upper extremity supported;Feet supported Sitting balance-Leahy Scale: Good     Standing balance support: Bilateral upper extremity supported;During functional activity Standing balance-Leahy Scale: Poor Standing balance comment: reliant on external support                           ADL either performed or assessed with clinical judgement   ADL Overall ADL's : At baseline                                       General ADL Comments: Pt had trouble wiping after BM, able to ambulate to the bathroom, family believes this is her baseline     Vision Patient Visual Report: No change from baseline Vision Assessment?: No apparent visual deficits     Perception     Praxis      Pertinent Vitals/Pain Pain Assessment: Faces Faces Pain Scale: Hurts little more Pain Location: right side Pain Descriptors / Indicators: Discomfort;Sore Pain Intervention(s): Monitored during session;Repositioned  Hand Dominance Right   Extremity/Trunk Assessment Upper Extremity Assessment Upper Extremity Assessment: Generalized weakness   Lower Extremity Assessment Lower Extremity Assessment: Generalized weakness   Cervical / Trunk Assessment Cervical / Trunk Assessment: Kyphotic   Communication Communication Communication: No difficulties   Cognition Arousal/Alertness: Awake/alert Behavior During Therapy: WFL for tasks assessed/performed Overall Cognitive Status: Within Functional Limits for tasks assessed                                     General Comments  [name] daughter  and family present for session    Exercises     Shoulder Instructions      Home Living Family/patient expects to be discharged to:: Private residence Living Arrangements: Children Available Help at Discharge: Family;Friend(s);Available 24 hours/day Type of Home: Apartment Home Access: Level entry     Home Layout: One level     Bathroom Shower/Tub: Tub/shower unit;Curtain   Bathroom Toilet: Standard Bathroom Accessibility: No   Home Equipment: Environmental consultant - 2 wheels;Cane - quad;Walker - 4 wheels;Shower seat;Bedside commode;Grab bars - tub/shower;Hand held shower head          Prior Functioning/Environment Level of Independence: Needs assistance  Gait / Transfers Assistance Needed: RW for ambulation  ADL's / Homemaking Assistance Needed: assist with bathing   Comments: Daughter works from home and provides 24/7 supervision; currently getting HHPT and has aide 2x a week        OT Problem List: Impaired balance (sitting and/or standing);Decreased activity tolerance;Decreased safety awareness;Obesity      OT Treatment/Interventions:      OT Goals(Current goals can be found in the care plan section) Acute Rehab OT Goals Patient Stated Goal: to get stronger and get home OT Goal Formulation: With patient/family Time For Goal Achievement: 06/18/17 Potential to Achieve Goals: Good  OT Frequency:     Barriers to D/C:            Co-evaluation              AM-PAC PT "6 Clicks" Daily Activity     Outcome Measure Help from another person eating meals?: None Help from another person taking care of personal grooming?: A Little Help from another person toileting, which includes using toliet, bedpan, or urinal?: A Little Help from another person bathing (including washing, rinsing, drying)?: A Lot Help from another person to put on and taking off regular upper body clothing?: None Help from another person to put on and taking off regular lower body clothing?: A Lot 6  Click Score: 18   End of Session Equipment Utilized During Treatment: Gait belt;Rolling walker Nurse Communication: Mobility status  Activity Tolerance: Patient tolerated treatment well Patient left: in bed;with call bell/phone within reach;with bed alarm set;with family/visitor present  OT Visit Diagnosis: Unsteadiness on feet (R26.81);Other abnormalities of gait and mobility (R26.89);History of falling (Z91.81);Repeated falls (R29.6)                Time: 1355-1426 OT Time Calculation (min): 31 min Charges:  OT General Charges $OT Visit: 1 Procedure OT Evaluation $OT Eval Moderate Complexity: 1 Procedure OT Treatments $Self Care/Home Management : 8-22 mins G-Codes: OT G-codes **NOT FOR INPATIENT CLASS** Functional Assessment Tool Used: AM-PAC 6 Clicks Daily Activity Functional Limitation: Self care Self Care Current Status (W0981): At least 40 percent but less than 60 percent impaired, limited or restricted Self Care Goal Status (X9147): At least 20 percent but  less than 40 percent impaired, limited or restricted Self Care Discharge Status [phone]): At least 40 percent but less than 60 percent impaired, limited or restricted   Sherryl Manges OTR/L [phone]  Evern Bio Kylin Dubs 06/04/2017, 3:26 PM

## 2017-06-04 NOTE — Evaluation (Signed)
Clinical/Bedside Swallow Evaluation Patient Details  Name: Danielle Benitez MRN: [id number] Date of Birth: [birth year]  Today's Date: 06/04/2017 Time: SLP Start Time (ACUTE ONLY): 0700 SLP Stop Time (ACUTE ONLY): 0710 SLP Time Calculation (min) (ACUTE ONLY): 10 min  Past Medical History:  Past Medical History:  Diagnosis Date  . Abnormality of gait   . Anemia, unspecified   . Anticoagulated- Eliquis 07/31/2015  . Anxiety state, unspecified   . Aortic stenosis    a. Mild by echo 08/2013, not seen on repeat echo 02/2017.  Marland Kitchen Bifascicular block    afib  . Carotid artery disease (HCC)    a. Carotid dopplers (9/17) with 40-59% LICA stenosis (of note, prior ABI normal).  . Chronic diastolic CHF (congestive heart failure) (HCC)    a. Dx 07/2015 - EF 35-40%, unclear etiology ? r/t afib or viral etiology. b. EF normal in 02/2017.  . CKD (chronic kidney disease), stage III   . Depressive disorder, not elsewhere classified   . DM (diabetes mellitus), type 2, uncontrolled, with renal complications (HCC) 01/08/2012  . Eczema 11/22/2013  . Esophageal dysmotility 08/05/2015  . Essential hypertension   . Female stress incontinence 05/06/2015  . Full dentures   . GERD (gastroesophageal reflux disease)   . Gout   . HOH (hard of hearing)   . Internal hemorrhoids without mention of complication   . Iron deficiency   . Lumbago   . Memory loss   . Mild CAD    a. Cath 2013: 20-30% prox RCA.  Marland Kitchen Myocardial infarction (HCC) 07/2015  . Nonspecific abnormal results of liver function study   . Obesity, unspecified   . Orthostatic hypotension   . Osteoarthritis   . Other and unspecified hyperlipidemia   . PAF (paroxysmal atrial fibrillation) (HCC) 07/11/2015   a. prior DCCVs, amiodarone, anticoagulation.  . Pain in joint, shoulder region 11/22/2013   left   . Personal history of fall   . Presence of permanent cardiac pacemaker   . Pruritus 11/22/2013  . S/P cardiac pacemaker procedure 01/11/2012   a. for  symptomatic bradycardia. Followed by Dr. Ladona Ridgel. Device changed out 2017 due to ppm infection.  . Stroke [facility])    a. evidence of prior strokes on MRI in 2018.  Marland Kitchen UTI (urinary tract infection) 04/2017  . Wears glasses    Past Surgical History:  Past Surgical History:  Procedure Laterality Date  . CARDIAC CATHETERIZATION  12/03/2003   Dr Clarene Duke  . CARDIOVERSION N/A 07/25/2015   Procedure: CARDIOVERSION;  Surgeon: Pricilla Riffle, MD;  Location[facility] ENDOSCOPY;  Service: Cardiovascular;  Laterality: N/A;  . CARDIOVERSION N/A 08/06/2015   Procedure: CARDIOVERSION;  Surgeon: Laurey Morale, MD;  Location: [facility] ENDOSCOPY;  Service: Cardiovascular;  Laterality: N/A;  . CATARACT EXTRACTION W/ INTRAOCULAR LENS  IMPLANT, BILATERAL Bilateral 03/2015-04/2015  . COLONOSCOPY     ?????  . EP IMPLANTABLE DEVICE N/A 04/15/2016   Procedure: Pacemaker Implant;  Surgeon: Marinus Maw, MD;  Location: [facility] INVASIVE CV LAB;  Service: Cardiovascular;  Laterality: N/A;  . ESOPHAGOGASTRODUODENOSCOPY N/A 08/08/2015   Procedure: ESOPHAGOGASTRODUODENOSCOPY (EGD);  Surgeon: Iva Boop, MD;  Location: [facility] ENDOSCOPY;  Service: Endoscopy;  Laterality: N/A;  . EXTREMITY CYST EXCISION     finger  . EYE SURGERY Bilateral    catartact  . INSERT / REPLACE / REMOVE PACEMAKER    . LEFT HEART CATHETERIZATION WITH CORONARY ANGIOGRAM N/A 01/10/2012   Procedure: LEFT HEART CATHETERIZATION WITH CORONARY ANGIOGRAM;  Surgeon: Lisette Abu.  Hilty, MD;  Location: [facility] CATH LAB;  Service: Cardiovascular;  Laterality: N/A;  . ORIF WRIST FRACTURE Right 04/09/2014   Procedure: OPEN REDUCTION INTERNAL FIXATION (ORIF) RIGHT DISPLACED  DISTAL RADIUS  FRACTURE;  Surgeon: Dominica Severin, MD;  Location: [facility];  Service: Orthopedics;  Laterality: Right;  . PACEMAKER LEAD REMOVAL  04/12/2016   TEMPORARY PACEMAKER INSERTION  . PACEMAKER LEAD REMOVAL  04/15/2016   Procedure: Pacemaker Lead Removal;  Surgeon: Marinus Maw, MD;  Location: [facility]  INVASIVE CV LAB;  Service: Cardiovascular;;  . PACEMAKER LEAD REMOVAL N/A 04/12/2016   Procedure: PACEMAKER LEAD REMOVAL;  Surgeon: Marinus Maw, MD;  Location: [facility] OR;  Service: Cardiovascular;  Laterality: N/A;  . PERMANENT PACEMAKER INSERTION Left 01/11/2012   Procedure: PERMANENT PACEMAKER INSERTION;  Surgeon: Thurmon Fair, MD;  Location: [facility] CATH LAB;  Service: Cardiovascular;  Laterality: Left;  . TEE WITHOUT CARDIOVERSION N/A 07/25/2015   Procedure: TRANSESOPHAGEAL ECHOCARDIOGRAM (TEE);  Surgeon: Pricilla Riffle, MD;  Location: [facility] ENDOSCOPY;  Service: Cardiovascular;  Laterality: N/A;   HPI:  Danielle Benitez a 83 y/o female with PMH significant for DM type, CKD stage 3, chronic diastolic heart failure, HLD, HTN. A. Fib (on eliquis) and prior hx of stroke ( apparently w/o residual deficit); who came via EMS due to acute change in mentation, slurred speech and left facial droop. Patient symptoms started aprox 2 hours prior to presentation to ED; patient found not to be a candidate for TPA as she is chronically on anticoagulation. concerns for toxic encephalopathy VS TIA VS stroke. MRI negative. Seen in 02/16/2017, swallow WNL. pt reports a history of esophageal complaints, primarily solid food esophageal dysphagia.    Assessment / Plan / Recommendation Clinical Impression  Pt demonstrates normal swallow function at bedside, no acute findings or signs of aspiration. Pt does report a feeling of solid foods getting stuck (points to sternum) but says her doctor has advised her to eat small, soft bites, follow with sips and drink warm liquids with her meals, which she does. Will initiate a regular texture diet as pt is able to make approrpatie choices and follow recommendations. No SLP f/u needed. Screened pt for cognitive linguistic needs, none found. Will also discontinue cognitive linguistic eval order.  SLP Visit Diagnosis: Dysphagia, unspecified (R13.10)    Aspiration Risk  Mild aspiration risk     Diet Recommendation Regular;Thin liquid   Liquid Administration via: Cup;Straw Medication Administration: Whole meds with liquid Supervision: Patient able to self feed Compensations: Slow rate;Small sips/bites;Follow solids with liquid Postural Changes: Seated upright at 90 degrees;Remain upright for at least 30 minutes after po intake    Other  Recommendations Oral Care Recommendations: Oral care BID   Follow up Recommendations        Frequency and Duration            Prognosis        Swallow Study   General HPI: Danielle Benitez a 83 y/o female with PMH significant for DM type, CKD stage 3, chronic diastolic heart failure, HLD, HTN. A. Fib (on eliquis) and prior hx of stroke ( apparently w/o residual deficit); who came via EMS due to acute change in mentation, slurred speech and left facial droop. Patient symptoms started aprox 2 hours prior to presentation to ED; patient found not to be a candidate for TPA as she is chronically on anticoagulation. concerns for toxic encephalopathy VS TIA VS stroke. MRI negative. Seen in 02/16/2017, swallow WNL. pt reports  a history of esophageal complaints, primarily solid food esophageal dysphagia.  Type of Study: Bedside Swallow Evaluation Previous Swallow Assessment: see HPI Diet Prior to this Study: NPO Temperature Spikes Noted: No Respiratory Status: Room air History of Recent Intubation: No Behavior/Cognition: Alert;Cooperative;Pleasant mood Oral Cavity Assessment: Within Functional Limits Oral Care Completed by SLP: No Oral Cavity - Dentition: Dentures, top;Dentures, bottom Vision: Functional for self-feeding Self-Feeding Abilities: Able to feed self Patient Positioning: Upright in bed Baseline Vocal Quality: Normal Volitional Cough: Strong Volitional Swallow: Able to elicit    Oral/Motor/Sensory Function Overall Oral Motor/Sensory Function: Within functional limits   Ice Chips     Thin Liquid Thin Liquid: Within functional  limits Presentation: Cup;Straw;Self Fed    Nectar Thick Nectar Thick Liquid: Not tested   Honey Thick Honey Thick Liquid: Not tested   Puree Puree: Within functional limits   Solid   GO   Solid: Within functional limits Presentation: Self Fed    Functional Assessment Tool Used: clinical judgement Functional Limitations: Swallowing Swallow Current Status (U2025): At least 1 percent but less than 20 percent impaired, limited or restricted Swallow Goal Status [phone]): At least 1 percent but less than 20 percent impaired, limited or restricted Swallow Discharge Status [phone]): At least 1 percent but less than 20 percent impaired, limited or restricted  [facility], [place] CCC-SLP [phone]  Claudine Mouton 06/04/2017,7:15 AM

## 2017-06-04 NOTE — Discharge Summary (Addendum)
Physician Discharge Summary  Danielle Benitez OFB:[id number] DOB: 1932-08-20 DOA: 06/03/2017  PCP: Tonia Ghent, MD  Admit date: 06/03/2017 Discharge date: 06/07/2017  Admitted From: Home Disposition: Home   Recommendations for Outpatient Follow-up:  1. Follow up with PCP in 1-2 weeks, consider suppressive antibiotics for recurrent UTI's requiring hospitalization.   Home Health: Resume HH-PT and aide, declined OT. Equipment/Devices: None new Discharge Condition: Stable CODE STATUS: Full Diet recommendation: Heart healthy, carbohydrate-limited.   Brief/Interim Summary: Danielle Benitez is a 83 y/o female with PMH significant for DM type, CKD stage 3, chronic diastolic heart failure, HLD, HTN. A. Fib (on eliquis) and prior hx of stroke who came via EMS due to acute change in mentation, slurred speech and left facial droop about 3 hours PTA. Family also reported increase frequency and incontinence; reporting as well that she experience this symptoms when ever she had a UTI. No fever, no CP, no nausea, no vomiting, no hematuria, no melena. She had CT of the head that r/o hemorrhagic stroke; also had EKG, UA and urine culture. Empirically received one dose of rocephin. [facility] consulted.  Hospital course: Mentation returned to baseline overnight. Pt feels well, family states she's back to normal. Ok to DC per neurology.   Discharge Diagnoses:  Active Problems:   DM, type 2, with renal complications    Essential hypertension   Weakness   UTI (urinary tract infection)   Chronic diastolic CHF (congestive heart failure) (HCC)   Stage 3 chronic kidney disease   Confusion  Acute metabolic encephalopathy due to UTI:  - Urine culture pending - Continue abx - Continue treating constipation  P8EU with renal complications:Recent M3N 8.3% - Restart home medications  Essential hypertension - Restart home medications  Weakness: Improved.  - PT and aide to continue at home  Chronic  diastolic congestive heart failure: Last EF 60-65%, currently compensated - Resume home medications  Stage III CKD: Stable, at baseline.   Discharge Instructions Discharge Instructions    Diet - low sodium heart healthy    Complete by:  As directed    Discharge instructions    Complete by:  As directed    You were admitted for confusion and change in neurological status. There was no evidence of an acute stroke on brain imaging, and you have improved with the treatment of a UTI that was found in your urinalysis. You are stable for discharge with the following recommendations:  - Continue antibiotics by taking keflex twice daily for the next 6 days.  - Follow up with Dr. Damita Dunnings - If your symptoms return, seek medical attention   Increase activity slowly    Complete by:  As directed      Allergies as of 06/04/2017      Reactions   Beta Adrenergic Blockers Other (See Comments)   Couldn't speak or hear Metoprolol seems ok   Jardiance [empagliflozin] Other (See Comments)   Rash, kidney issues   Spironolactone Other (See Comments)   Hyperkalemia      Medication List    TAKE these medications   albuterol 108 (90 Base) MCG/ACT inhaler Commonly known as:  PROVENTIL HFA;VENTOLIN HFA Inhale 2 puffs into the lungs every 4 (four) hours as needed for wheezing or shortness of breath (cough, shortness of breath or wheezing.).   allopurinol 300 MG tablet Commonly known as:  ZYLOPRIM TAKE '300MG'$  BY MOUTH DAILY   amiodarone 200 MG tablet Commonly known as:  PACERONE TAKE '200MG'$  BY MOUTH DAILY  apixaban 2.5 MG Tabs tablet Commonly known as:  ELIQUIS TAKE 2.'5MG'$  BY MOUTH TWICE A DAY   atorvastatin 20 MG tablet Commonly known as:  LIPITOR Take 1 tablet (20 mg total) by mouth daily at 6 PM.   cephALEXin 500 MG capsule Commonly known as:  KEFLEX Take 1 capsule (500 mg total) by mouth 2 (two) times daily.   DULoxetine 20 MG capsule Commonly known as:  CYMBALTA TAKE '20MG'$  BY MOUTH EVERY  NIGHT AT BEDTIME   fluticasone 50 MCG/ACT nasal spray Commonly known as:  FLONASE Place 1 spray into both nostrils daily as needed for allergies or rhinitis.   furosemide 40 MG tablet Commonly known as:  LASIX Take 80 mg in the AM and 40 mg in the PM   GAS RELIEF 80 MG chewable tablet Generic drug:  simethicone CHEW ONE TABLET BY MOUTH EVERY 6 HOURS AS NEEDED FOR GAS.   glimepiride 1 MG tablet Commonly known as:  AMARYL TAKE '1MG'$  BY MOUTH EVERY MORNING WITH BREAKFAST   glucose blood test strip Commonly known as:  ONE TOUCH ULTRA TEST CHECK BLOOD SUGAR TWICE A DAY AND AS DIRECTED;DX E11.21   Insulin Glargine 100 UNIT/ML Solostar Pen Commonly known as:  LANTUS SOLOSTAR Inject 25-27 Units into the skin See admin instructions. Daily at 10pm. If blood sugar 100-150 give same units as last dose, if greater than 151 increase by 1 unit. What changed:  how much to take  additional instructions   loratadine 5 MG chewable tablet Commonly known as:  CLARITIN CHILDRENS Chew 1 tablet (5 mg total) by mouth daily.   metoprolol succinate 25 MG 24 hr tablet Commonly known as:  TOPROL-XL Take 1 tablet (25 mg total) by mouth 2 (two) times daily.   ONE TOUCH ULTRA 2 w/Device Kit Check blood sugar twice a day and as directed Dx E11.21   ONE-A-DAY 50 PLUS PO Take 1 capsule by mouth daily.   pantoprazole 40 MG tablet Commonly known as:  PROTONIX TAKE '40MG'$  BY MOUTH DAILY   Pen Needles 30G X 5 MM Misc Inject 1 Dose as directed daily. Use with insulin pen   saxagliptin HCl 2.5 MG Tabs tablet Commonly known as:  ONGLYZA TAKE 2.'5MG'$  BY MOUTH DAILY   senna-docusate 8.6-50 MG tablet Commonly known as:  Senokot-S Take 1 tablet by mouth daily as needed for mild constipation.   traZODone 50 MG tablet Commonly known as:  DESYREL Take 0.5-1 tablets (25-50 mg total) by mouth at bedtime as needed for sleep. Don't take >'50mg'$  in a day.            Discharge Care Instructions         Start     Ordered   06/04/17 0000  Increase activity slowly     06/04/17 1215   06/04/17 0000  Diet - low sodium heart healthy     06/04/17 1215   06/04/17 0000  Discharge instructions    Comments:  You were admitted for confusion and change in neurological status. There was no evidence of an acute stroke on brain imaging, and you have improved with the treatment of a UTI that was found in your urinalysis. You are stable for discharge with the following recommendations:  - Continue antibiotics by taking keflex twice daily for the next 6 days.  - Follow up with Dr. Damita Dunnings - If your symptoms return, seek medical attention   06/04/17 1215   06/04/17 0000  cephALEXin (KEFLEX) 500 MG capsule  2 times  daily     06/04/17 1527     Follow-up Information    Tonia Ghent, MD. Schedule an appointment as soon as possible for a visit in 1 week(s).   Specialty:  Family Medicine Contact information: [place] [address] [phone]          Allergies  Allergen Reactions  . Beta Adrenergic Blockers Other (See Comments)    Couldn't speak or hear Metoprolol seems ok  . Jardiance [Empagliflozin] Other (See Comments)    Rash, kidney issues  . Spironolactone Other (See Comments)    Hyperkalemia    Consultations:  Neurology, Dr. Rory Percy  Procedures/Studies: Dg Chest 2 View  Result Date: 06/03/2017 CLINICAL DATA:  83 year old female with weakness. EXAM: CHEST  2 VIEW COMPARISON:  03/31/2017 FINDINGS: Right-sided 2 lead pacer device appears in stable position. The cardiomediastinal silhouette is enlarged but stable. Lung volumes are low. No focal opacities, sizeable effusions or pneumothorax. No acute osseous abnormalities. IMPRESSION: 1. Low lung volumes without active cardiopulmonary disease. 2. Stable cardiomegaly. Electronically Signed   By: Kristopher Oppenheim M.D.   On: 06/03/2017 10:48   Mr Brain Wo Contrast  Result Date: 06/03/2017 CLINICAL DATA:  Left facial  droop and slurred speech. EXAM: MRI HEAD WITHOUT CONTRAST TECHNIQUE: Multiplanar, multiecho pulse sequences of the brain and surrounding structures were obtained without intravenous contrast. COMPARISON:  Head CT 06/03/2017 and MRI 04/29/2017 FINDINGS: The study is mildly motion degraded. Brain: There is no evidence of acute infarct, intracranial hemorrhage, mass, midline shift, or extra-axial fluid collection. The ventricles and sulci are within normal limits for age. Patchy T2 hyperintensities in the cerebral white matter are unchanged from the prior MRI and nonspecific but compatible with mild-to-moderate chronic small vessel ischemic disease. Chronic lacunar infarcts are again noted in the right basal ganglia, right thalamus, and cerebellum. Vascular: Major intracranial vascular flow voids are preserved. Skull and upper cervical spine: Unremarkable bone marrow signal. Sinuses/Orbits: Bilateral cataract extraction. Trace left mastoid fluid. Clear paranasal sinuses. Other: None. IMPRESSION: 1. No acute intracranial abnormality. 2. Mild-to-moderate chronic small vessel ischemic disease. Electronically Signed   By: Logan Bores M.D.   On: 06/03/2017 17:17   Ct Head Code Stroke Wo Contrast  Result Date: 06/03/2017 CLINICAL DATA:  Code stroke. Facial droop that has resolved. Bilateral extremity weakness. EXAM: CT HEAD WITHOUT CONTRAST TECHNIQUE: Contiguous axial images were obtained from the base of the skull through the vertex without intravenous contrast. COMPARISON:  04/28/2017 FINDINGS: Brain: No evidence of acute infarction, hemorrhage, hydrocephalus, extra-axial collection or mass lesion/mass effect. Chronic small vessel ischemia with gliosis in the cerebral white matter and remote right thalamus lacunar infarct. Vascular: Atherosclerotic calcification.  No hyperdense vessel. Skull: No acute or aggressive finding.  Hyperostosis interna. Sinuses/Orbits: Negative Other:  Prelim results sent 06/03/2017  at 8:58  am to Dr. Rory Percy. ASPECTS [facility] Stroke Program Early CT Score) Not scored with this symptomatology IMPRESSION: 1. No acute finding. 2. Chronic small vessel ischemia including remote right thalamus lacunar infarct. Electronically Signed   By: Monte Fantasia M.D.   On: 06/03/2017 09:01   Subjective: Feels back to baseline, wants to go home. Lives with daughter and SIL with grandchildren. They state she's back to baseline.   Discharge Exam: Vitals:   06/04/17 0900 06/04/17 1352  BP: 127/65 (!) 123/54  Pulse: 70 69  Resp: 16 18  Temp: 98.2 F (36.8 C) 97.7 F (36.5 C)  SpO2: 100% 96%   General: Pleasant elderly lady  in no distress.  Cardiovascular: RRR, no JVD or edema Respiratory: Nonlabored, clear Abdominal: Soft, NT, ND, bowel sounds + Neuro: Alert, oriented, normal speech, slow gait with assistance. No focal deficits.   Labs: BNP (last 3 results)  Recent Labs  03/17/17 1744 03/31/17 2000 06/03/17 1338  BNP 365.9* 398.4* 711.6*   Basic Metabolic Panel:  Recent Labs Lab 06/03/17 0842 06/03/17 0847 06/03/17 1721 06/04/17 0424  NA 140 141  --  142  K 3.9 3.9  --  3.1*  CL 103 102  --  105  CO2 20*  --   --  27  GLUCOSE 195* 197*  --  114*  BUN 30* 43*  --  25*  CREATININE 2.07* 2.00*  --  1.70*  CALCIUM 8.7*  --   --  8.6*  MG  --   --  1.9  --   PHOS  --   --  2.8  --    Liver Function Tests:  Recent Labs Lab 06/03/17 0842  AST 62*  ALT QUANTITY NOT SUFFICIENT, UNABLE TO PERFORM TEST  ALKPHOS 127*  BILITOT QUANTITY NOT SUFFICIENT, UNABLE TO PERFORM TEST  PROT 6.8  ALBUMIN 2.6*   CBC:  Recent Labs Lab 06/03/17 0842 06/03/17 0847 06/04/17 0424  WBC 8.2  --  7.1  NEUTROABS 4.9  --   --   HGB 10.5* 11.9* 9.3*  HCT 33.0* 35.0* 30.2*  MCV 92.7  --  91.8  PLT 87*  --  95*   CBG:  Recent Labs Lab 06/03/17 0846 06/03/17 1936 06/03/17 2130 06/03/17 2356 06/04/17 0500  GLUCAP 193* 123* 110* 119* 118*   Thyroid function studies No results  for input(s): TSH, T4TOTAL, T3FREE, THYROIDAB in the last 72 hours.  Invalid input(s): FREET3 Anemia work up No results for input(s): VITAMINB12, FOLATE, FERRITIN, TIBC, IRON, RETICCTPCT in the last 72 hours. Urinalysis    Component Value Date/Time   COLORURINE YELLOW 06/03/2017 1257   APPEARANCEUR CLOUDY (A) 06/03/2017 1257   LABSPEC 1.013 06/03/2017 1257   PHURINE 6.0 06/03/2017 1257   GLUCOSEU NEGATIVE 06/03/2017 1257   HGBUR NEGATIVE 06/03/2017 1257   BILIRUBINUR NEGATIVE 06/03/2017 1257   BILIRUBINUR 1+ 09/20/2016 1424   KETONESUR NEGATIVE 06/03/2017 1257   PROTEINUR NEGATIVE 06/03/2017 1257   UROBILINOGEN 0.2 09/20/2016 1424   UROBILINOGEN 0.2 08/02/2015 0330   NITRITE NEGATIVE 06/03/2017 1257   LEUKOCYTESUR MODERATE (A) 06/03/2017 1257   Time coordinating discharge: Approximately 40 minutes  Vance Gather, MD  [facility] 06/07/2017, 4:26 PM Pager [phone]

## 2017-06-04 NOTE — Evaluation (Signed)
Physical Therapy Evaluation Patient Details Name: Danielle Benitez MRN: [id number] DOB: 1931-12-20 Today's Date: 06/04/2017   History of Present Illness  Danielle Benitez is a 83 y/o female with PMH significant for DM type, CKD stage 3, chronic diastolic heart failure, HLD, HTN. A. Fib (on eliquis) and prior hx of stroke ( apparently w/o residual deficit); who came via EMS due to acute change in mentation, slurred speech and left facial droop. Patient symptoms started aprox 2 hours prior to presentation to ED; patient found not to be a candidate for TPA as she is chronically on anticoagulation. concerns for toxic encephalopathy VS TIA VS stroke. MRI negative  Clinical Impression  Pt is noted to have significant issues of postural control, but has limitations of her admission being OBS.  Daughter is home during the day and will have to assist her with all mobility to be safe which may be impractical.  Her plan is for SNF admission unless her family is going to bring her directly home to stay.  Will require 24/7 assistance with all transfers and gait, and will need to progress gait as possible to decrease this assistance with walker.      Follow Up Recommendations SNF    Equipment Recommendations  None recommended by PT    Recommendations for Other Services       Precautions / Restrictions Precautions Precautions: Fall Precaution Comments: history of falls Restrictions Weight Bearing Restrictions: No      Mobility  Bed Mobility Overal bed mobility: Needs Assistance Bed Mobility: Sit to Supine;Supine to Sit     Supine to sit: Mod assist Sit to supine: Min assist   General bed mobility comments: pt required help to scoot out to EOB, bed pad and trunk assist used  Transfers Overall transfer level: Needs assistance Equipment used: Rolling walker (2 wheeled) Transfers: Sit to/from Stand Sit to Stand: Min assist;Mod assist         General transfer comment: powered up help and pt cannot  fully stand up straight  Ambulation/Gait Ambulation/Gait assistance: Min assist Ambulation Distance (Feet): 5 Feet Assistive device: Rolling walker (2 wheeled);1 person hand held assist Gait Pattern/deviations: Step-to pattern;Shuffle;Decreased stride length;Wide base of support;Trunk flexed Gait velocity: reduced Gait velocity interpretation: Below normal speed for age/gender General Gait Details: sidesteps with some difficulty and pt is requiring help to move walker  Stairs            Wheelchair Mobility    Modified Rankin (Stroke Patients Only)       Balance Overall balance assessment: Needs assistance Sitting-balance support: Feet supported Sitting balance-Leahy Scale: Fair     Standing balance support: Bilateral upper extremity supported;During functional activity Standing balance-Leahy Scale: Poor Standing balance comment: requires walker and extra assistance                             Pertinent Vitals/Pain Pain Assessment: Faces Faces Pain Scale: Hurts little more Pain Location: right side Pain Descriptors / Indicators: Discomfort;Sore Pain Intervention(s): Monitored during session;Repositioned    Home Living Family/patient expects to be discharged to:: Private residence Living Arrangements: Children Available Help at Discharge: Family;Friend(s);Available 24 hours/day Type of Home: Apartment Home Access: Level entry     Home Layout: One level Home Equipment: Walker - 2 wheels;Cane - quad;Walker - 4 wheels;Shower seat;Bedside commode;Grab bars - tub/shower;Hand held shower head      Prior Function Level of Independence: Needs assistance   Gait /  Transfers Assistance Needed: RW for ambulation   ADL's / Homemaking Assistance Needed: assist with bathing  Comments: Daughter works from home and provides 24/7 supervision; currently getting HHPT and has aide 2x a week     Hand Dominance   Dominant Hand: Right    Extremity/Trunk  Assessment   Upper Extremity Assessment Upper Extremity Assessment: Generalized weakness    Lower Extremity Assessment Lower Extremity Assessment: Generalized weakness    Cervical / Trunk Assessment Cervical / Trunk Assessment: Kyphotic  Communication   Communication: No difficulties  Cognition Arousal/Alertness: Awake/alert Behavior During Therapy: WFL for tasks assessed/performed Overall Cognitive Status: Within Functional Limits for tasks assessed                                        General Comments General comments (skin integrity, edema, etc.): Pt had her grandson there but he was not involved in the evaluation    Exercises     Assessment/Plan    PT Assessment Patient needs continued PT services  PT Problem List Decreased strength;Decreased range of motion;Decreased activity tolerance;Decreased balance;Decreased mobility;Decreased coordination;Decreased knowledge of use of DME;Decreased safety awareness;Decreased knowledge of precautions;Cardiopulmonary status limiting activity;Obesity;Decreased skin integrity;Pain       PT Treatment Interventions DME instruction;Gait training;Functional mobility training;Therapeutic activities;Therapeutic exercise;Balance training;Neuromuscular re-education;Patient/family education    PT Goals (Current goals can be found in the Care Plan section)  Acute Rehab PT Goals Patient Stated Goal: to get stronger and get home PT Goal Formulation: With patient Time For Goal Achievement: 06/18/17 Potential to Achieve Goals: Good    Frequency Min 2X/week   Barriers to discharge Other (comment) (weak and high fall risk)      Co-evaluation               AM-PAC PT "6 Clicks" Daily Activity  Outcome Measure Difficulty turning over in bed (including adjusting bedclothes, sheets and blankets)?: Unable Difficulty moving from lying on back to sitting on the side of the bed? : Unable Difficulty sitting down on and  standing up from a chair with arms (e.g., wheelchair, bedside commode, etc,.)?: Unable Help needed moving to and from a bed to chair (including a wheelchair)?: A Lot Help needed walking in hospital room?: A Lot Help needed climbing 3-5 steps with a railing? : Total 6 Click Score: 8    End of Session Equipment Utilized During Treatment: Gait belt;Oxygen Activity Tolerance: Patient limited by fatigue Patient left: in bed;with call bell/phone within reach;with bed alarm set;with family/visitor present Nurse Communication: Mobility status;Other (comment) (CNA asked pt to remain in bed to assist her bath) PT Visit Diagnosis: Unsteadiness on feet (R26.81);Muscle weakness (generalized) (M62.81);Difficulty in walking, not elsewhere classified (R26.2)    Time: 1610-9604 PT Time Calculation (min) (ACUTE ONLY): 32 min   Charges:   PT Evaluation $PT Eval Moderate Complexity: 1 Mod PT Treatments $Gait Training: 8-22 mins   PT G Codes:   PT G-Codes **NOT FOR INPATIENT CLASS** Functional Assessment Tool Used: AM-PAC 6 Clicks Basic Mobility;Clinical judgement Functional Limitation: Mobility: Walking and moving around Mobility: Walking and Moving Around Current Status (V4098): At least 40 percent but less than 60 percent impaired, limited or restricted Mobility: Walking and Moving Around Goal Status [phone]): At least 1 percent but less than 20 percent impaired, limited or restricted    Ivar Drape 06/04/2017, 4:54 PM   Samul Dada, PT MS Acute Rehab Dept. Number:  [place] [phone] and [place]

## 2017-06-04 NOTE — Progress Notes (Signed)
Subjective: [name] was sitting in bed on my visit. She stated that she feels much better today than she did yesterday. She was alert, oriented, speech clear and had appropriate responses to my questions. She doesn't understand why she continues to get UTIs.  Pertinent Imaging: CT head 06/03/17: Chronic small vessel disease, remote thalamic lacunar infarct. MR brain 06/03/17: No acute abnormality; chronic small vessel disease  Pertinent Medications: Cymbalta 20mg  daily Trazodone 50mg  PRN sleep Lasix 80mg  in the morning, 40mg  qhs Eliquis 2.5mg  daily Amiodarone 200mg  daily Rocephin 1g daily  Physical Examination: Vitals:   06/04/17 0500 06/04/17 0655  BP: 112/60 120/61  Pulse: 70 70  Resp: 16 16  Temp: 97.9 F (36.6 C) 98.2 F (36.8 C)  SpO2: 97% 97%    General: WDWN female.  HEENT:  Normocephalic, no lesions, without obvious abnormality.  Normal external eye and conjunctiva.  Normal external ears. Normal external nose, mucus membranes and septum.  Normal pharynx. Cardiovascular: regular rate and rhythm, pulses palpable throughout   Pulmonary: Chest clear, unlabored breathing Abdomen: Soft, non-tender Extremities: no joint deformities, effusion, or inflammation. 1+ pedal edema Musculoskeletal: Tone and bulk normal throughout; no atrophy noted  Neurological Examination:  CN: Pupils are equal, round and symmetrically reactive. EOMI without nystagmus. Facial sensation is intact to light touch. Face is symmetric at rest with normal strength and mobility. Hearing is intact to conversational voice. Palate elevates symmetrically and uvula is midline. Voice is normal in tone, pitch and quality. Bilateral SCM and trapezii are 5/5. Tongue is midline with normal bulk and mobility.  Motor: Normal bulk, tone, and strength. 5/5 throughout. Asterixis and right arm drift reduced, but still present in the BUE. Sensation: Intact to light touch.  DTRs: Diminished throughout  Coordination:  Finger-to-nose without dysmetria    Assessment Danielle Benitez is an 83 year old female who presents today with right sided weakness and slurred speech. CT and MRI without acute abnormality. +UTI the likely cause of her weakness and speech difficulties. Currently on rocephin and feeling far better today than she did yesterday. Hydration and resolution of her infection should help completely clear her symptoms.   Recommendations: 1) Correction of metabolic derangements 2) Optimize stroke risk factors 3) Neurology will sign off. Please call with questions.   Danielle Potter PA-C [facility] [phone]  06/04/2017, 7:33 AM  ATTENDING ADDENDUM Seen and examined. Reviewed CT and MRI with no acute abnormailty. Agree with plan above that I helped formulate. Likely stroke symptom recrudescence in setting of toxic/metabolic encephalopathy. Correct derangements No further neuro recs.  Milon Dikes, MD [facility] [phone]  If 7pm to 7am, please call on call as listed on AMION.

## 2017-06-04 NOTE — Care Management Note (Signed)
Case Management Note  Patient Details  Name: Danielle Benitez MRN: [id number] Date of Birth: [birth year]  Subjective/Objective:               Patient with order to DC to home today. Chart reviewed. No Home Health or Equipment needs, no unacknowledged Case Management consults or medication needs identified at the time of this note. Plan for DC to home. If needs arise today prior to discharge, please call Lawerance Sabal RN CM at [phone].      Action/Plan:   Expected Discharge Date:  06/04/17               Expected Discharge Plan:  Home/Self Care  In-House Referral:     Discharge planning Services  CM Consult  Post Acute Care Choice:    Choice offered to:     DME Arranged:    DME Agency:     HH Arranged:    HH Agency:     Status of Service:  Completed, signed off  If discussed at [employer] of Stay Meetings, dates discussed:    Additional Comments:  Lawerance Sabal, RN 06/04/2017, 12:23 PM

## 2017-06-07 DIAGNOSIS — J449 Chronic obstructive pulmonary disease, unspecified: Secondary | ICD-10-CM | POA: Diagnosis not present

## 2017-06-07 DIAGNOSIS — E1142 Type 2 diabetes mellitus with diabetic polyneuropathy: Secondary | ICD-10-CM | POA: Diagnosis not present

## 2017-06-07 DIAGNOSIS — I4891 Unspecified atrial fibrillation: Secondary | ICD-10-CM | POA: Diagnosis not present

## 2017-06-07 DIAGNOSIS — E1122 Type 2 diabetes mellitus with diabetic chronic kidney disease: Secondary | ICD-10-CM | POA: Diagnosis not present

## 2017-06-07 DIAGNOSIS — I2510 Atherosclerotic heart disease of native coronary artery without angina pectoris: Secondary | ICD-10-CM | POA: Diagnosis not present

## 2017-06-07 DIAGNOSIS — N183 Chronic kidney disease, stage 3 (moderate): Secondary | ICD-10-CM | POA: Diagnosis not present

## 2017-06-07 DIAGNOSIS — Z794 Long term (current) use of insulin: Secondary | ICD-10-CM | POA: Diagnosis not present

## 2017-06-07 DIAGNOSIS — I130 Hypertensive heart and chronic kidney disease with heart failure and stage 1 through stage 4 chronic kidney disease, or unspecified chronic kidney disease: Secondary | ICD-10-CM | POA: Diagnosis not present

## 2017-06-07 DIAGNOSIS — I5022 Chronic systolic (congestive) heart failure: Secondary | ICD-10-CM | POA: Diagnosis not present

## 2017-06-07 DIAGNOSIS — M1990 Unspecified osteoarthritis, unspecified site: Secondary | ICD-10-CM | POA: Diagnosis not present

## 2017-06-07 DIAGNOSIS — Z950 Presence of cardiac pacemaker: Secondary | ICD-10-CM | POA: Diagnosis not present

## 2017-06-07 DIAGNOSIS — Z9181 History of falling: Secondary | ICD-10-CM | POA: Diagnosis not present

## 2017-06-08 ENCOUNTER — Telehealth: Payer: Self-pay

## 2017-06-08 NOTE — Telephone Encounter (Signed)
Left message on voicemail for Danielle Benitez to call back.

## 2017-06-08 NOTE — Telephone Encounter (Signed)
Verbal order given to Vernona Rieger by telephone as instructed.

## 2017-06-08 NOTE — Telephone Encounter (Signed)
Danielle Benitez with [facility] left v/m requesting verbal orders to recertify pt for [facility] PT 2 x a week for 6 weeks, HH aide x 2 a week for 4 weeks and HH OT evaluation and treat. Pt was seen in ED for UTI recently and pt is very weak; pt has fu appt with Dr Para March 06/09/17.

## 2017-06-08 NOTE — Telephone Encounter (Signed)
Please return call to Danielle Benitez about home helath orders [phone]

## 2017-06-08 NOTE — Telephone Encounter (Signed)
Please give the order.  Thanks.   

## 2017-06-09 ENCOUNTER — Encounter: Payer: Self-pay | Admitting: Family Medicine

## 2017-06-09 ENCOUNTER — Ambulatory Visit (INDEPENDENT_AMBULATORY_CARE_PROVIDER_SITE_OTHER): Payer: Medicare Other | Admitting: Family Medicine

## 2017-06-09 VITALS — BP 114/64 | HR 71 | Temp 97.9°F | Wt 203.2 lb

## 2017-06-09 DIAGNOSIS — N390 Urinary tract infection, site not specified: Secondary | ICD-10-CM

## 2017-06-09 DIAGNOSIS — N183 Chronic kidney disease, stage 3 (moderate): Secondary | ICD-10-CM

## 2017-06-09 DIAGNOSIS — E1122 Type 2 diabetes mellitus with diabetic chronic kidney disease: Secondary | ICD-10-CM | POA: Diagnosis not present

## 2017-06-09 DIAGNOSIS — I4891 Unspecified atrial fibrillation: Secondary | ICD-10-CM | POA: Diagnosis not present

## 2017-06-09 DIAGNOSIS — R7989 Other specified abnormal findings of blood chemistry: Secondary | ICD-10-CM

## 2017-06-09 DIAGNOSIS — Z9181 History of falling: Secondary | ICD-10-CM | POA: Diagnosis not present

## 2017-06-09 DIAGNOSIS — E1142 Type 2 diabetes mellitus with diabetic polyneuropathy: Secondary | ICD-10-CM | POA: Diagnosis not present

## 2017-06-09 DIAGNOSIS — I2510 Atherosclerotic heart disease of native coronary artery without angina pectoris: Secondary | ICD-10-CM | POA: Diagnosis not present

## 2017-06-09 DIAGNOSIS — Z794 Long term (current) use of insulin: Secondary | ICD-10-CM | POA: Diagnosis not present

## 2017-06-09 DIAGNOSIS — M1990 Unspecified osteoarthritis, unspecified site: Secondary | ICD-10-CM | POA: Diagnosis not present

## 2017-06-09 DIAGNOSIS — Z950 Presence of cardiac pacemaker: Secondary | ICD-10-CM | POA: Diagnosis not present

## 2017-06-09 DIAGNOSIS — I5022 Chronic systolic (congestive) heart failure: Secondary | ICD-10-CM | POA: Diagnosis not present

## 2017-06-09 DIAGNOSIS — J449 Chronic obstructive pulmonary disease, unspecified: Secondary | ICD-10-CM | POA: Diagnosis not present

## 2017-06-09 DIAGNOSIS — I5032 Chronic diastolic (congestive) heart failure: Secondary | ICD-10-CM

## 2017-06-09 DIAGNOSIS — I130 Hypertensive heart and chronic kidney disease with heart failure and stage 1 through stage 4 chronic kidney disease, or unspecified chronic kidney disease: Secondary | ICD-10-CM | POA: Diagnosis not present

## 2017-06-09 MED ORDER — INSULIN GLARGINE 100 UNIT/ML SOLOSTAR PEN: 16.0000 [IU] | PEN_INJECTOR | SUBCUTANEOUS | Status: DC

## 2017-06-09 NOTE — Progress Notes (Signed)
Admit date: 06/03/2017 Discharge date: 06/07/2017  Admitted From: Home Disposition: Home   Recommendations for Outpatient Follow-up:  1. Follow up with PCP in 1-2 weeks, consider suppressive antibiotics for recurrent UTI's requiring hospitalization.   Home Health: Resume HH-PT and aide, declined OT. Equipment/Devices: None new Discharge Condition: Stable CODE STATUS: Full Diet recommendation: Heart healthy, carbohydrate-limited.   Brief/Interim Summary: Danielle Pfeil Dunnis a 83 y/o female with PMH significant for DM type, CKD stage 3, chronic diastolic heart failure, HLD, HTN. A. Fib (on eliquis) and prior hx of stroke who came via EMS due to acute change in mentation, slurred speech and left facial droop about 3 hours PTA. Family also reported increase frequency and incontinence; reporting as well that she experience this symptoms when ever she had a UTI. No fever, no CP, no nausea, no vomiting, no hematuria, no melena. She had CT of the head that r/o hemorrhagic stroke; also had EKG, UA and urine culture. Empirically received one dose of rocephin. Neurology and [facility] consulted.  Hospital course: Mentation returned to baseline overnight. Pt feels well, family states she's back to normal. Ok to DC per neurology.   Discharge Diagnoses:  Active Problems:   DM, type 2, with renal complications    Essential hypertension   Weakness   UTI (urinary tract infection)   Chronic diastolic CHF (congestive heart failure) (HCC)   Stage 3 chronic kidney disease   Confusion  Acute metabolic encephalopathy due to UTI:  - Urine culture pending - Continue abx - Continue treating constipation  T2DM with renal complications:Recent A1c 8.3% - Restart home medications  Essential hypertension - Restart home medications  Weakness: Improved.  - PT and aide to continue at home  Chronic diastoliccongestive heart failure: Last EF 60-65%, currently compensated - Resume home medications  Stage  III CKD: Stable, at baseline.  =============================================== Admitted with strokelike symptoms.  MRI negative. Found to have likely UTI. Symptoms started to improve after dose of Rocephin. Treated with antibiotics as inpatient, she improved, was discharged. Still relatively debilitated. Still on antibiotics currently. No new focal neurologic symptoms. Inpatient course and imaging and labs discussed with patient. Unfortunately she has significant heart failure and she is on fluid restriction which may she cannot drink fluid as freely if she would like to try to prevent UTIs. She also has difficulty with urinary incontinence requiring her to wear depends which may also contribute. Discussed with patient and daughter. There are asking about prophylactic measures to try to prevent a UTI.   She is still on baseline medications for blood pressure, diabetes, etc.   PMH and SH reviewed  ROS: Per HPI unless specifically indicated in ROS section   Meds, vitals, and allergies reviewed.   GEN: nad, alert and oriented, in wheelchair, chronically ill appearing woman in NAD HEENT: mucous membranes moist NECK: supple w/o LA CV: rrr. PULM: ctab, no inc wob ABD: soft, +bs, not ttp EXT: no edema SKIN: no acute rash

## 2017-06-09 NOTE — Patient Instructions (Addendum)
Okay to try cranberry juice, a few ounces a day.  Or you can try the cranberry pills.  You are still on your typical fluid restriction.  Go to the lab on the way out.  We'll contact you with your lab report. Update me in a few days.  Take care.  Glad to see you.

## 2017-06-10 ENCOUNTER — Encounter: Payer: Self-pay | Admitting: Family Medicine

## 2017-06-10 DIAGNOSIS — I4891 Unspecified atrial fibrillation: Secondary | ICD-10-CM | POA: Diagnosis not present

## 2017-06-10 DIAGNOSIS — I5022 Chronic systolic (congestive) heart failure: Secondary | ICD-10-CM | POA: Diagnosis not present

## 2017-06-10 DIAGNOSIS — Z9181 History of falling: Secondary | ICD-10-CM | POA: Diagnosis not present

## 2017-06-10 DIAGNOSIS — E1142 Type 2 diabetes mellitus with diabetic polyneuropathy: Secondary | ICD-10-CM | POA: Diagnosis not present

## 2017-06-10 DIAGNOSIS — I2510 Atherosclerotic heart disease of native coronary artery without angina pectoris: Secondary | ICD-10-CM | POA: Diagnosis not present

## 2017-06-10 DIAGNOSIS — N183 Chronic kidney disease, stage 3 (moderate): Secondary | ICD-10-CM | POA: Diagnosis not present

## 2017-06-10 DIAGNOSIS — J449 Chronic obstructive pulmonary disease, unspecified: Secondary | ICD-10-CM | POA: Diagnosis not present

## 2017-06-10 DIAGNOSIS — Z794 Long term (current) use of insulin: Secondary | ICD-10-CM | POA: Diagnosis not present

## 2017-06-10 DIAGNOSIS — E1122 Type 2 diabetes mellitus with diabetic chronic kidney disease: Secondary | ICD-10-CM | POA: Diagnosis not present

## 2017-06-10 DIAGNOSIS — M1990 Unspecified osteoarthritis, unspecified site: Secondary | ICD-10-CM | POA: Diagnosis not present

## 2017-06-10 DIAGNOSIS — I130 Hypertensive heart and chronic kidney disease with heart failure and stage 1 through stage 4 chronic kidney disease, or unspecified chronic kidney disease: Secondary | ICD-10-CM | POA: Diagnosis not present

## 2017-06-10 DIAGNOSIS — Z950 Presence of cardiac pacemaker: Secondary | ICD-10-CM | POA: Diagnosis not present

## 2017-06-10 NOTE — Addendum Note (Signed)
Addended by: Alvina Chou on: 06/10/2017 03:38 PM   Modules accepted: Orders

## 2017-06-10 NOTE — Assessment & Plan Note (Addendum)
Likely the issue here that caused strokelike symptoms. Discussed with patient about pathophysiology of encephalopathy related to acute infection.  Mentation is back to baseline. She is oriented now. She has difficulties with heart failure which limits her ability to drink fluid freely. She needs to continue fluid restriction. She is still using depends but needs to change these frequently.  we talked about prophylactic medications. The evidence for cranberry juice is not strong, but that is likely safer than antibiotic prophylaxis. We know that if she goes on antibiotic prophylaxis her next UTI will be resistant to that medication. She would also run the risk of long-term exposure to antibiotics. She preferred to try cranberry juice. This is reasonable.  In the meantime she'll continue baseline medications for heart failure and diabetes. She has PT follow-up pending. No change in her other medicines. She will finish her antibiotics tomorrow. At this point okay for outpatient follow-up.  No need to recheck urine today but recheck basic metabolic panel regarding her creatinine and K.  Will likely need to continue adjusting her Lasix related to her heart failure.

## 2017-06-14 ENCOUNTER — Encounter (HOSPITAL_COMMUNITY): Payer: Self-pay | Admitting: Cardiology

## 2017-06-14 ENCOUNTER — Ambulatory Visit (HOSPITAL_COMMUNITY)
Admission: RE | Admit: 2017-06-14 | Discharge: 2017-06-14 | Disposition: A | Discharge status: Home / Self Care | Payer: Medicare Other | Source: Ambulatory Visit | Attending: Cardiology | Admitting: Cardiology

## 2017-06-14 VITALS — BP 130/61 | HR 84 | Wt 206.5 lb

## 2017-06-14 DIAGNOSIS — M109 Gout, unspecified: Secondary | ICD-10-CM | POA: Insufficient documentation

## 2017-06-14 DIAGNOSIS — I5032 Chronic diastolic (congestive) heart failure: Secondary | ICD-10-CM | POA: Diagnosis not present

## 2017-06-14 DIAGNOSIS — I130 Hypertensive heart and chronic kidney disease with heart failure and stage 1 through stage 4 chronic kidney disease, or unspecified chronic kidney disease: Secondary | ICD-10-CM | POA: Insufficient documentation

## 2017-06-14 DIAGNOSIS — N183 Chronic kidney disease, stage 3 (moderate): Secondary | ICD-10-CM | POA: Insufficient documentation

## 2017-06-14 DIAGNOSIS — I4891 Unspecified atrial fibrillation: Secondary | ICD-10-CM | POA: Diagnosis not present

## 2017-06-14 DIAGNOSIS — I2510 Atherosclerotic heart disease of native coronary artery without angina pectoris: Secondary | ICD-10-CM | POA: Diagnosis not present

## 2017-06-14 DIAGNOSIS — E1122 Type 2 diabetes mellitus with diabetic chronic kidney disease: Secondary | ICD-10-CM | POA: Insufficient documentation

## 2017-06-14 DIAGNOSIS — E785 Hyperlipidemia, unspecified: Secondary | ICD-10-CM | POA: Insufficient documentation

## 2017-06-14 DIAGNOSIS — I480 Paroxysmal atrial fibrillation: Secondary | ICD-10-CM | POA: Diagnosis not present

## 2017-06-14 DIAGNOSIS — M1990 Unspecified osteoarthritis, unspecified site: Secondary | ICD-10-CM | POA: Diagnosis not present

## 2017-06-14 DIAGNOSIS — I6529 Occlusion and stenosis of unspecified carotid artery: Secondary | ICD-10-CM | POA: Diagnosis not present

## 2017-06-14 DIAGNOSIS — E119 Type 2 diabetes mellitus without complications: Secondary | ICD-10-CM | POA: Insufficient documentation

## 2017-06-14 DIAGNOSIS — Z794 Long term (current) use of insulin: Secondary | ICD-10-CM | POA: Insufficient documentation

## 2017-06-14 DIAGNOSIS — Z79899 Other long term (current) drug therapy: Secondary | ICD-10-CM | POA: Insufficient documentation

## 2017-06-14 DIAGNOSIS — I5022 Chronic systolic (congestive) heart failure: Secondary | ICD-10-CM | POA: Diagnosis not present

## 2017-06-14 DIAGNOSIS — R001 Bradycardia, unspecified: Secondary | ICD-10-CM | POA: Insufficient documentation

## 2017-06-14 DIAGNOSIS — Z9181 History of falling: Secondary | ICD-10-CM | POA: Diagnosis not present

## 2017-06-14 DIAGNOSIS — J449 Chronic obstructive pulmonary disease, unspecified: Secondary | ICD-10-CM | POA: Diagnosis not present

## 2017-06-14 DIAGNOSIS — Z950 Presence of cardiac pacemaker: Secondary | ICD-10-CM | POA: Diagnosis not present

## 2017-06-14 DIAGNOSIS — E1142 Type 2 diabetes mellitus with diabetic polyneuropathy: Secondary | ICD-10-CM | POA: Diagnosis not present

## 2017-06-14 LAB — BASIC METABOLIC PANEL
Anion gap: 10 (ref 5–15)
BUN: 32 mg/dL — AB (ref 6–20)
CHLORIDE: 101 mmol/L (ref 101–111)
CO2: 26 mmol/L (ref 22–32)
Calcium: 8.8 mg/dL — ABNORMAL LOW (ref 8.9–10.3)
Creatinine, Ser: 2.3 mg/dL — ABNORMAL HIGH (ref 0.44–1.00)
GFR calc Af Amer: 21 mL/min — ABNORMAL LOW (ref >60)
GFR calc non Af Amer: 19 mL/min — ABNORMAL LOW (ref >60)
Glucose, Bld: 277 mg/dL — ABNORMAL HIGH (ref 65–99)
POTASSIUM: 4.3 mmol/L (ref 3.5–5.1)
SODIUM: 137 mmol/L (ref 135–145)

## 2017-06-14 LAB — CBC
HCT: 32.1 % — ABNORMAL LOW (ref 36.0–46.0)
HEMOGLOBIN: 9.9 g/dL — AB (ref 12.0–15.0)
MCH: 28.7 pg (ref 26.0–34.0)
MCHC: 30.8 g/dL (ref 30.0–36.0)
MCV: 93 fL (ref 78.0–100.0)
Platelets: 86 10*3/uL — ABNORMAL LOW (ref 150–400)
RBC: 3.45 MIL/uL — AB (ref 3.87–5.11)
RDW: 19 % — ABNORMAL HIGH (ref 11.5–15.5)
WBC: 8.4 10*3/uL (ref 4.0–10.5)

## 2017-06-14 LAB — BRAIN NATRIURETIC PEPTIDE: B Natriuretic Peptide: 236.7 pg/mL — ABNORMAL HIGH (ref 0.0–100.0)

## 2017-06-14 MED ORDER — POTASSIUM CHLORIDE CRYS ER 20 MEQ PO TBCR: 40.0000 meq | EXTENDED_RELEASE_TABLET | Freq: Every day | ORAL | 3 refills | Status: DC

## 2017-06-14 MED ORDER — TORSEMIDE 20 MG PO TABS: 80.0000 mg | ORAL_TABLET | Freq: Every day | ORAL | 3 refills | Status: DC

## 2017-06-14 NOTE — Progress Notes (Signed)
Patient ID: [name], female   DOB: April 09, 1932, 83 y.o.   MRN: [id number] PCP: Dr. Para March HF Cardiology: Dr. Shirlee Latch  83 yo with history of HTN, orthostatic hypotension, symptomatic bradycardia with Medtronic PPM, paroxysmal atrial fibrillation, chronic diastolic CHF, CKD stage III, and iron deficiency anemia presents for cardiology followup. Around the beginning of 9/16, she began to develop exertional dyspnea. She was seen in the cardiology office on 07/25/15. She was in atrial fibrillation with RVR and echo showed EF 35-40% (had been 50-55% in past). PPM interrogation showed that atrial fibrillation had begun 07/11/15, so exertional dyspnea had preceded this by about 4 weeks. She was admitted from 10/7-10/17. She was anemic with hemoglobin 8.7. She was heme negative but Fe deficient. EGD was not done. She had TEE-guided DCCV to NSR. She also had AKI with creatinine up to 3.41, but it trended back down. She was sent home in NSR. She was readmitted 10/19-10/23 with recurrent atrial fibrillation and acute on chronic systolic CHF. She was diuresed about 10 lbs. It was decided to rate control her rather than repeat cardioversion, and she was sent home. she was only at home for about a day and developed increased dyspnea so came back to the ER and was re-admitted.  She was diuresed with IV Lasix and started on amiodarone.  She underwent DCCV on 10/26 but atrial fibrillation recurred on 10/29.  More amiodarone was loaded, and she went back into NSR.   Lexiscan Cardiolite in 11/16 showed basal inferior/inferolatearl fixed defect consistent with infarction but no ischemia.    Repeat echo was done 1/17.  This showed EF 45% with normal RV size and mildly decreased systolic function.   She was admitted in 7/17 with suspected infection of her device.  She had her PPM extracted and got a temporary-permanent device with later Medtronic PPM replacement. Last echo in 7/18 showed EF 60-65% with mild LVH,  mildly dilated RV.  She was admitted in 7/18 and again in 8/18 with complex UTIs with mental status changes.  She was given IV fluid during these admissions.   Symptomatically worse. She is very weak and has had falls, getting PT at home.  She is short of breath walking around her house with her walker.  She is not in atrial fibrillation today.  Not lightheaded.  BP stable.  Weight up 12 lbs.  .   ECG (personally reviewed): a-paced, RBBB, LAFB  Labs (7/16): LDL 48, HDL 55 Labs (10/16): K 3.9, creatinine 2.08 Labs (11/16): K 4.9, creatinine 2.5, LFTs normal, TSH normal, HCT 43.6 Labs (12/16): K 5.2, creatinine 1.73, HCT 41.9, plts 123 Labs (3/17): LFTs normal Labs (7/17): creatinine 1.87 Labs (8/17): LDL 57, HDL 50 Labs (9/17): K 4.9, creatinine 1.74, BNP 253 Labs (11/17): TSH normal, AST 67, ALT 37 Labs (12/17): K 4.1, creatinine 1.9, BNP 140 Labs (8/18): K 3.1, creatinine 1.7, TSH normal, hgb 9.3, AST 62  PMH: 1. HTN but also with history of orthostatic hypotension. 2. CKD stage III 3. Aortic stenosis: Mild 4. Symptomatic bradycardia s/p Medtronic PPM. - PPM infection 7/17 with device replacement.  5. Hyperlipidemia 6. Atrial fibrillation: Paroxysmal. TEE-guided DCCV in 10/16. She had DCCV again latera in 10/16 and was started on amiodarone.   - PFTs (11/16) with normal spirometry, severe diffusion defect c/w pulmonary vascular disease.  7. Type II diabetes with diabetic neuropathy.  8. Gout 9. Fe-deficiency anemia 10. Chronic diastolic CHF: Echo 11/14 with EF 50-55%. Echo (10/16) with EF 35-40%, moderate  LV dilation, diffuse hypokinesis, mild AS, moderate MR, PASP 48 mmHg.  Lexiscan Cardiolite (11/16) with EF 31%, basal inferior and inferolateral fixed defect possibly consistent with infarction, no ischemia; degree of LV hypokinesis was out of proportion to the perfusion defect.  Echo (1/17) with EF 45%, normal RV size with mildly decreased systolic function, aortic sclerosis  without stenosis.  - Echo (8/17): EF 55-60%, mild LVH, normal RV size and systolic function.  - Echo (7/18): EF 60-65%, mildly dilated RV, PASP 36 mmHg.  11. Esophageal dysmotility 12. Carotid stenosis: Carotid dopplers (6/29) with 52-84% LICA stenosis.  - carotid dopplers (7/18) with mild disease bilaterally.  13. ABIs (12/17) normal.  14. Recurrent UTIs  SH: Lives alone but daughter is nearby.  Nonsmoker. No ETOH.    FH: No premature CAD.   ROS: All systems reviewed and negative except as per HPI.    Current Outpatient Prescriptions  Medication Sig Dispense Refill  . albuterol (PROVENTIL HFA;VENTOLIN HFA) 108 (90 Base) MCG/ACT inhaler Inhale 2 puffs into the lungs every 4 (four) hours as needed for wheezing or shortness of breath (cough, shortness of breath or wheezing.). 1 Inhaler 1  . allopurinol (ZYLOPRIM) 300 MG tablet TAKE '300MG'$  BY MOUTH DAILY 90 tablet 1  . amiodarone (PACERONE) 200 MG tablet TAKE '200MG'$  BY MOUTH DAILY    . apixaban (ELIQUIS) 2.5 MG TABS tablet TAKE 2.'5MG'$  BY MOUTH TWICE A DAY    . atorvastatin (LIPITOR) 20 MG tablet Take 1 tablet (20 mg total) by mouth daily at 6 PM. 30 tablet 0  . Blood Glucose Monitoring Suppl (ONE TOUCH ULTRA 2) w/Device KIT Check blood sugar twice a day and as directed Dx E11.21 1 each 0  . DULoxetine (CYMBALTA) 20 MG capsule TAKE '20MG'$  BY MOUTH EVERY NIGHT AT BEDTIME    . fluticasone (FLONASE) 50 MCG/ACT nasal spray Place 1 spray into both nostrils daily as needed for allergies or rhinitis.    Marland Kitchen GAS RELIEF 80 MG chewable tablet CHEW ONE TABLET BY MOUTH EVERY 6 HOURS AS NEEDED FOR GAS. 30 tablet 5  . glimepiride (AMARYL) 1 MG tablet TAKE '1MG'$  BY MOUTH EVERY MORNING WITH BREAKFAST 90 tablet 1  . glucose blood (ONE TOUCH ULTRA TEST) test strip CHECK BLOOD SUGAR TWICE A DAY AND AS DIRECTED;DX E11.21 100 each 5  . Insulin Glargine (LANTUS SOLOSTAR) 100 UNIT/ML Solostar Pen Inject 16 Units into the skin See admin instructions. Daily at 10pm. If blood  sugar 100-150 give same units as last dose, if greater than 151 increase by 1 unit.    . Insulin Pen Needle (PEN NEEDLES) 30G X 5 MM MISC Inject 1 Dose as directed daily. Use with insulin pen 100 each 3  . loratadine (CLARITIN CHILDRENS) 5 MG chewable tablet Chew 1 tablet (5 mg total) by mouth daily.    . metoprolol succinate (TOPROL-XL) 25 MG 24 hr tablet Take 1 tablet (25 mg total) by mouth 2 (two) times daily. 60 tablet 0  . Multiple Vitamins-Minerals (ONE-A-DAY 50 PLUS PO) Take 1 capsule by mouth daily.    . pantoprazole (PROTONIX) 40 MG tablet TAKE '40MG'$  BY MOUTH DAILY    . saxagliptin HCl (ONGLYZA) 2.5 MG TABS tablet TAKE 2.'5MG'$  BY MOUTH DAILY    . senna-docusate (SENOKOT-S) 8.6-50 MG tablet Take 1 tablet by mouth daily as needed for mild constipation.    . traZODone (DESYREL) 50 MG tablet Take 0.5-1 tablets (25-50 mg total) by mouth at bedtime as needed for sleep. Don't take >'50mg'$   in a day. 30 tablet 3  . potassium chloride SA (KLOR-CON M20) 20 MEQ tablet Take 2 tablets (40 mEq total) by mouth daily. 60 tablet 3  . torsemide (DEMADEX) 20 MG tablet Take 4 tablets (80 mg total) by mouth daily. 120 tablet 3   No current facility-administered medications for this encounter.    BP 130/61   Pulse 84   Wt 206 lb 8 oz (93.7 kg)   SpO2 100%   BMI 39.02 kg/m  General: NAD, obese.  Neck: JVP 12 cm, no thyromegaly or thyroid nodule.  Lungs: Clear to auscultation bilaterally with normal respiratory effort. CV: Nondisplaced PMI.  Heart regular S1/S2, no S3/S4, 2/6 early SEM RUSB.  1+ edema to thighs bilaterally.  No carotid bruit.  Normal pedal pulses.  Abdomen: Soft, nontender, no hepatosplenomegaly, no distention.  Skin: Intact without lesions or rashes.  Neurologic: Alert and oriented x 3.  Psych: Normal affect. Extremities: No clubbing or cyanosis.  HEENT: Normal.    Assessment/Plan: 1. Chronic diastolic CHF: EF 32-20% by echo 10/16. Etiology uncertain: could be tachy-mediated  cardiomyopathy, but symptoms seemed to predate the onset of atrial fibrillation. Possible viral myocarditis. Cannot rule out ischemic cardiomyopathy, but no definite anginal symptoms. Cardiolite in 11/16 showed a small area possibly of prior infarction but EF and wall motion abnormalities were out of proportion to the perfusion defect. Last echo in 7/18 showed EF up to 60-65%.  NYHA class IIIb symptoms today with volume overload and weight gain.    - Stop Lasix and start torsemide 80 mg daily for better absorption.  Add KCl 40 mEq daily with recent fall in K.     - Continue current Toprol XL.  - BMET today and again in 1 week. - We will work on [employer] for her.  2. Atrial fibrillation: Tolerates poorly. NSR today.   - Continue Eliquis - Toprol XL 25 mg bid.  - Continue amiodarone for maintenance of NSR. Check TSH and LFTs today.  She will need a regular eye exam.   3. CKD Stage III: BMET today.  4. Medtronic PPM for symptomatic bradycardia.   5. CAD: Possible old infarction on Cardiolite.  No ischemia.  Would not cath with significant CKD and no chest pain.  Continue statin, check lipids next appointment.  No aspirin given Eliquis use.  6. ?Claudication: Foot pain with ambulation.  With normal ABIs, suspect foot pain/burning is diabetic neuropathy.  7. Carotid stenosis: Mild on 7/18 dopplers.  8. HTN: Controlled.  9. Diabetes: She is on saxagliptin, which is not ideal in CHF patients.  Would consider transition to empagliflozin.   Followup in 1 week.  She may end up requiring admission for IV diuresis.   Loralie Champagne 06/14/2017

## 2017-06-14 NOTE — Patient Instructions (Signed)
Stop Lasix  Start Toresimide (4 tabs) daily  Stat Potassium 40 meq (2 tabs) daily  Labs drawn today  Your physician recommends that you return for lab work in: 1 week  Your physician recommends that you schedule a follow-up appointment in: 1 week

## 2017-06-15 ENCOUNTER — Other Ambulatory Visit (HOSPITAL_COMMUNITY): Payer: Self-pay

## 2017-06-15 MED ORDER — POTASSIUM CHLORIDE CRYS ER 20 MEQ PO TBCR: 40.0000 meq | EXTENDED_RELEASE_TABLET | Freq: Every day | ORAL | 3 refills | Status: DC

## 2017-06-15 NOTE — Telephone Encounter (Signed)
Spoke with Pt. daughter [name] who tried to get K+ from pharmacy, we reordered. Daughter verbalizes understanding that Ct was elevated but to continue with plan as discussed on 9/4 appointment.

## 2017-06-16 DIAGNOSIS — Z794 Long term (current) use of insulin: Secondary | ICD-10-CM | POA: Diagnosis not present

## 2017-06-16 DIAGNOSIS — I2510 Atherosclerotic heart disease of native coronary artery without angina pectoris: Secondary | ICD-10-CM | POA: Diagnosis not present

## 2017-06-16 DIAGNOSIS — M1990 Unspecified osteoarthritis, unspecified site: Secondary | ICD-10-CM | POA: Diagnosis not present

## 2017-06-16 DIAGNOSIS — E1122 Type 2 diabetes mellitus with diabetic chronic kidney disease: Secondary | ICD-10-CM | POA: Diagnosis not present

## 2017-06-16 DIAGNOSIS — J449 Chronic obstructive pulmonary disease, unspecified: Secondary | ICD-10-CM | POA: Diagnosis not present

## 2017-06-16 DIAGNOSIS — N183 Chronic kidney disease, stage 3 (moderate): Secondary | ICD-10-CM | POA: Diagnosis not present

## 2017-06-16 DIAGNOSIS — I4891 Unspecified atrial fibrillation: Secondary | ICD-10-CM | POA: Diagnosis not present

## 2017-06-16 DIAGNOSIS — I130 Hypertensive heart and chronic kidney disease with heart failure and stage 1 through stage 4 chronic kidney disease, or unspecified chronic kidney disease: Secondary | ICD-10-CM | POA: Diagnosis not present

## 2017-06-16 DIAGNOSIS — Z950 Presence of cardiac pacemaker: Secondary | ICD-10-CM | POA: Diagnosis not present

## 2017-06-16 DIAGNOSIS — Z9181 History of falling: Secondary | ICD-10-CM | POA: Diagnosis not present

## 2017-06-16 DIAGNOSIS — E1142 Type 2 diabetes mellitus with diabetic polyneuropathy: Secondary | ICD-10-CM | POA: Diagnosis not present

## 2017-06-16 DIAGNOSIS — I5022 Chronic systolic (congestive) heart failure: Secondary | ICD-10-CM | POA: Diagnosis not present

## 2017-06-17 DIAGNOSIS — Z9181 History of falling: Secondary | ICD-10-CM | POA: Diagnosis not present

## 2017-06-17 DIAGNOSIS — J449 Chronic obstructive pulmonary disease, unspecified: Secondary | ICD-10-CM | POA: Diagnosis not present

## 2017-06-17 DIAGNOSIS — I130 Hypertensive heart and chronic kidney disease with heart failure and stage 1 through stage 4 chronic kidney disease, or unspecified chronic kidney disease: Secondary | ICD-10-CM | POA: Diagnosis not present

## 2017-06-17 DIAGNOSIS — M1990 Unspecified osteoarthritis, unspecified site: Secondary | ICD-10-CM | POA: Diagnosis not present

## 2017-06-17 DIAGNOSIS — E1142 Type 2 diabetes mellitus with diabetic polyneuropathy: Secondary | ICD-10-CM | POA: Diagnosis not present

## 2017-06-17 DIAGNOSIS — Z950 Presence of cardiac pacemaker: Secondary | ICD-10-CM | POA: Diagnosis not present

## 2017-06-17 DIAGNOSIS — I4891 Unspecified atrial fibrillation: Secondary | ICD-10-CM | POA: Diagnosis not present

## 2017-06-17 DIAGNOSIS — Z794 Long term (current) use of insulin: Secondary | ICD-10-CM | POA: Diagnosis not present

## 2017-06-17 DIAGNOSIS — E1122 Type 2 diabetes mellitus with diabetic chronic kidney disease: Secondary | ICD-10-CM | POA: Diagnosis not present

## 2017-06-17 DIAGNOSIS — N183 Chronic kidney disease, stage 3 (moderate): Secondary | ICD-10-CM | POA: Diagnosis not present

## 2017-06-17 DIAGNOSIS — I5022 Chronic systolic (congestive) heart failure: Secondary | ICD-10-CM | POA: Diagnosis not present

## 2017-06-17 DIAGNOSIS — I2510 Atherosclerotic heart disease of native coronary artery without angina pectoris: Secondary | ICD-10-CM | POA: Diagnosis not present

## 2017-06-19 ENCOUNTER — Telehealth: Payer: Self-pay | Admitting: Family Medicine

## 2017-06-19 NOTE — Telephone Encounter (Signed)
Please call patient's daughter. Discussed with cardiology. Would stop Onglyza.  Unclear if this medication is causing her to retain fluid. In the meantime she may need to increase her Lantus insulin. If sugar is above 151 in the AM then add 1 unit per day to the previous dose. Let me know if she has questions. Med list updated. Thanks.

## 2017-06-20 ENCOUNTER — Telehealth (HOSPITAL_COMMUNITY): Payer: Self-pay | Admitting: *Deleted

## 2017-06-20 DIAGNOSIS — Z7689 Persons encountering health services in other specified circumstances: Secondary | ICD-10-CM

## 2017-06-20 NOTE — Telephone Encounter (Signed)
[name] (daughter) advised.

## 2017-06-20 NOTE — Telephone Encounter (Signed)
Pts daughter called stating pts weight went up 5lbs overnight (203.8lbs-208.8lbs) and swelling in her face. No other complaints. She has an appt tomorrow. Per Denny Peon increase torsemide to 80/80 and K to 40/40 just for today. She is aware and agreeable.

## 2017-06-21 ENCOUNTER — Telehealth: Payer: Self-pay | Admitting: Family Medicine

## 2017-06-21 ENCOUNTER — Ambulatory Visit (HOSPITAL_COMMUNITY)
Admission: RE | Admit: 2017-06-21 | Discharge: 2017-06-21 | Disposition: A | Discharge status: Home / Self Care | Payer: Medicare Other | Source: Ambulatory Visit | Attending: Cardiology | Admitting: Cardiology

## 2017-06-21 ENCOUNTER — Encounter (HOSPITAL_COMMUNITY): Payer: Self-pay

## 2017-06-21 VITALS — BP 150/88 | HR 77 | Wt 201.0 lb

## 2017-06-21 DIAGNOSIS — N183 Chronic kidney disease, stage 3 unspecified: Secondary | ICD-10-CM

## 2017-06-21 DIAGNOSIS — R531 Weakness: Secondary | ICD-10-CM | POA: Diagnosis not present

## 2017-06-21 DIAGNOSIS — D509 Iron deficiency anemia, unspecified: Secondary | ICD-10-CM | POA: Diagnosis not present

## 2017-06-21 DIAGNOSIS — Z7901 Long term (current) use of anticoagulants: Secondary | ICD-10-CM | POA: Diagnosis not present

## 2017-06-21 DIAGNOSIS — I480 Paroxysmal atrial fibrillation: Secondary | ICD-10-CM

## 2017-06-21 DIAGNOSIS — E1142 Type 2 diabetes mellitus with diabetic polyneuropathy: Secondary | ICD-10-CM | POA: Diagnosis not present

## 2017-06-21 DIAGNOSIS — I10 Essential (primary) hypertension: Secondary | ICD-10-CM

## 2017-06-21 DIAGNOSIS — R001 Bradycardia, unspecified: Secondary | ICD-10-CM | POA: Insufficient documentation

## 2017-06-21 DIAGNOSIS — Z9181 History of falling: Secondary | ICD-10-CM

## 2017-06-21 DIAGNOSIS — R269 Unspecified abnormalities of gait and mobility: Secondary | ICD-10-CM | POA: Diagnosis not present

## 2017-06-21 DIAGNOSIS — I5032 Chronic diastolic (congestive) heart failure: Secondary | ICD-10-CM | POA: Diagnosis not present

## 2017-06-21 DIAGNOSIS — I4891 Unspecified atrial fibrillation: Secondary | ICD-10-CM

## 2017-06-21 DIAGNOSIS — I951 Orthostatic hypotension: Secondary | ICD-10-CM | POA: Diagnosis not present

## 2017-06-21 DIAGNOSIS — M1991 Primary osteoarthritis, unspecified site: Secondary | ICD-10-CM | POA: Diagnosis not present

## 2017-06-21 DIAGNOSIS — R0602 Shortness of breath: Secondary | ICD-10-CM | POA: Diagnosis not present

## 2017-06-21 DIAGNOSIS — F329 Major depressive disorder, single episode, unspecified: Secondary | ICD-10-CM | POA: Diagnosis not present

## 2017-06-21 DIAGNOSIS — Z794 Long term (current) use of insulin: Secondary | ICD-10-CM | POA: Insufficient documentation

## 2017-06-21 DIAGNOSIS — J449 Chronic obstructive pulmonary disease, unspecified: Secondary | ICD-10-CM

## 2017-06-21 DIAGNOSIS — M109 Gout, unspecified: Secondary | ICD-10-CM | POA: Diagnosis not present

## 2017-06-21 DIAGNOSIS — Z950 Presence of cardiac pacemaker: Secondary | ICD-10-CM | POA: Diagnosis not present

## 2017-06-21 DIAGNOSIS — I350 Nonrheumatic aortic (valve) stenosis: Secondary | ICD-10-CM | POA: Diagnosis not present

## 2017-06-21 DIAGNOSIS — E1122 Type 2 diabetes mellitus with diabetic chronic kidney disease: Secondary | ICD-10-CM | POA: Insufficient documentation

## 2017-06-21 DIAGNOSIS — I130 Hypertensive heart and chronic kidney disease with heart failure and stage 1 through stage 4 chronic kidney disease, or unspecified chronic kidney disease: Secondary | ICD-10-CM | POA: Diagnosis not present

## 2017-06-21 DIAGNOSIS — E785 Hyperlipidemia, unspecified: Secondary | ICD-10-CM | POA: Diagnosis not present

## 2017-06-21 DIAGNOSIS — F419 Anxiety disorder, unspecified: Secondary | ICD-10-CM

## 2017-06-21 DIAGNOSIS — I2510 Atherosclerotic heart disease of native coronary artery without angina pectoris: Secondary | ICD-10-CM | POA: Diagnosis not present

## 2017-06-21 DIAGNOSIS — I5022 Chronic systolic (congestive) heart failure: Secondary | ICD-10-CM

## 2017-06-21 NOTE — Progress Notes (Signed)
Patient ID: Danielle Benitez, female   DOB: 1932-04-18, 83 y.o.   MRN: [id number] PCP: Dr. Damita Dunnings HF Cardiology: Dr. Aundra Dubin  83 yo with history of HTN, orthostatic hypotension, symptomatic bradycardia with Medtronic PPM, paroxysmal atrial fibrillation, chronic diastolic CHF, CKD stage III, and iron deficiency anemia presents for cardiology followup. Around the beginning of 9/16, she began to develop exertional dyspnea. She was seen in the cardiology office on 07/25/15. She was in atrial fibrillation with RVR and echo showed EF 35-40% (had been 50-55% in past). PPM interrogation showed that atrial fibrillation had begun 07/11/15, so exertional dyspnea had preceded this by about 4 weeks. She was admitted from 10/7-10/17. She was anemic with hemoglobin 8.7. She was heme negative but Fe deficient. EGD was not done. She had TEE-guided DCCV to NSR. She also had AKI with creatinine up to 3.41, but it trended back down. She was sent home in NSR. She was readmitted 10/19-10/23 with recurrent atrial fibrillation and acute on chronic systolic CHF. She was diuresed about 10 lbs. It was decided to rate control her rather than repeat cardioversion, and she was sent home. she was only at home for about a day and developed increased dyspnea so came back to the ER and was re-admitted.  She was diuresed with IV Lasix and started on amiodarone.  She underwent DCCV on 10/26 but atrial fibrillation recurred on 10/29.  More amiodarone was loaded, and she went back into NSR.   Lexiscan Cardiolite in 11/16 showed basal inferior/inferolatearl fixed defect consistent with infarction but no ischemia.    Repeat echo was done 1/17.  This showed EF 45% with normal RV size and mildly decreased systolic function.   She was admitted in 7/17 with suspected infection of her device.  She had her PPM extracted and got a temporary-permanent device with later Medtronic PPM replacement. Last echo in 7/18 showed EF 60-65% with mild LVH,  mildly dilated RV.  She was admitted in 7/18 and again in 8/18 with complex UTIs with mental status changes.  She was given IV fluid during these admissions.   Presents today for HF follow up. Tearful, says that she is tired of watching her fluid and salt intake. Feels like a burden to her family. Weight down 5 pounds since last visit 2 weeks ago, but she remains SOB with dressing, bathing. Says she cannot do anything without becoming SOB. She says that she drinks and eats ice all day due to mouth dryness. Endorses orthopnea. Denies chest pain, palpitations.   Labs (7/16): LDL 48, HDL 55 Labs (10/16): K 3.9, creatinine 2.08 Labs (11/16): K 4.9, creatinine 2.5, LFTs normal, TSH normal, HCT 43.6 Labs (12/16): K 5.2, creatinine 1.73, HCT 41.9, plts 123 Labs (3/17): LFTs normal Labs (7/17): creatinine 1.87 Labs (8/17): LDL 57, HDL 50 Labs (9/17): K 4.9, creatinine 1.74, BNP 253 Labs (11/17): TSH normal, AST 67, ALT 37 Labs (12/17): K 4.1, creatinine 1.9, BNP 140 Labs (8/18): K 3.1, creatinine 1.7, TSH normal, hgb 9.3, AST 62  PMH: 1. HTN but also with history of orthostatic hypotension. 2. CKD stage III 3. Aortic stenosis: Mild 4. Symptomatic bradycardia s/p Medtronic PPM. - PPM infection 7/17 with device replacement.  5. Hyperlipidemia 6. Atrial fibrillation: Paroxysmal. TEE-guided DCCV in 10/16. She had DCCV again latera in 10/16 and was started on amiodarone.   - PFTs (11/16) with normal spirometry, severe diffusion defect c/w pulmonary vascular disease.  7. Type II diabetes with diabetic neuropathy.  8. Gout 9. Fe-deficiency  anemia 10. Chronic diastolic CHF: Echo 45/85 with EF 50-55%. Echo (10/16) with EF 35-40%, moderate LV dilation, diffuse hypokinesis, mild AS, moderate MR, PASP 48 mmHg.  Lexiscan Cardiolite (11/16) with EF 31%, basal inferior and inferolateral fixed defect possibly consistent with infarction, no ischemia; degree of LV hypokinesis was out of proportion to the  perfusion defect.  Echo (1/17) with EF 45%, normal RV size with mildly decreased systolic function, aortic sclerosis without stenosis.  - Echo (8/17): EF 55-60%, mild LVH, normal RV size and systolic function.  - Echo (7/18): EF 60-65%, mildly dilated RV, PASP 36 mmHg.  11. Esophageal dysmotility 12. Carotid stenosis: Carotid dopplers (9/29) with 24-46% LICA stenosis.  - carotid dopplers (7/18) with mild disease bilaterally.  13. ABIs (12/17) normal.  14. Recurrent UTIs  SH: Lives alone but daughter is nearby.  Nonsmoker. No ETOH.    FH: No premature CAD.   ROS: All systems reviewed and negative except as per HPI.    Current Outpatient Prescriptions  Medication Sig Dispense Refill  . albuterol (PROVENTIL HFA;VENTOLIN HFA) 108 (90 Base) MCG/ACT inhaler Inhale 2 puffs into the lungs every 4 (four) hours as needed for wheezing or shortness of breath (cough, shortness of breath or wheezing.). 1 Inhaler 1  . allopurinol (ZYLOPRIM) 300 MG tablet TAKE 300MG BY MOUTH DAILY 90 tablet 1  . amiodarone (PACERONE) 200 MG tablet TAKE 200MG BY MOUTH DAILY    . apixaban (ELIQUIS) 2.5 MG TABS tablet TAKE 2.5MG BY MOUTH TWICE A DAY    . atorvastatin (LIPITOR) 20 MG tablet Take 1 tablet (20 mg total) by mouth daily at 6 PM. 30 tablet 0  . Blood Glucose Monitoring Suppl (ONE TOUCH ULTRA 2) w/Device KIT Check blood sugar twice a day and as directed Dx E11.21 1 each 0  . DULoxetine (CYMBALTA) 20 MG capsule TAKE 20MG BY MOUTH EVERY NIGHT AT BEDTIME    . fluticasone (FLONASE) 50 MCG/ACT nasal spray Place 1 spray into both nostrils daily as needed for allergies or rhinitis.    Marland Kitchen GAS RELIEF 80 MG chewable tablet CHEW ONE TABLET BY MOUTH EVERY 6 HOURS AS NEEDED FOR GAS. 30 tablet 5  . glimepiride (AMARYL) 1 MG tablet TAKE 1MG BY MOUTH EVERY MORNING WITH BREAKFAST 90 tablet 1  . glucose blood (ONE TOUCH ULTRA TEST) test strip CHECK BLOOD SUGAR TWICE A DAY AND AS DIRECTED;DX E11.21 100 each 5  . Insulin Glargine  (LANTUS SOLOSTAR) 100 UNIT/ML Solostar Pen Inject 16 Units into the skin See admin instructions. Daily at 10pm. If blood sugar 100-150 give same units as last dose, if greater than 151 increase by 1 unit.    . Insulin Pen Needle (PEN NEEDLES) 30G X 5 MM MISC Inject 1 Dose as directed daily. Use with insulin pen 100 each 3  . loratadine (CLARITIN CHILDRENS) 5 MG chewable tablet Chew 1 tablet (5 mg total) by mouth daily.    . metoprolol succinate (TOPROL-XL) 25 MG 24 hr tablet Take 1 tablet (25 mg total) by mouth 2 (two) times daily. 60 tablet 0  . Multiple Vitamins-Minerals (ONE-A-DAY 50 PLUS PO) Take 1 capsule by mouth daily.    . pantoprazole (PROTONIX) 40 MG tablet TAKE 40MG BY MOUTH DAILY    . potassium chloride SA (KLOR-CON M20) 20 MEQ tablet Take 2 tablets (40 mEq total) by mouth daily. 60 tablet 3  . senna-docusate (SENOKOT-S) 8.6-50 MG tablet Take 1 tablet by mouth daily as needed for mild constipation.    Marland Kitchen  torsemide (DEMADEX) 20 MG tablet Take 4 tablets (80 mg total) by mouth daily. 120 tablet 3  . traZODone (DESYREL) 50 MG tablet Take 0.5-1 tablets (25-50 mg total) by mouth at bedtime as needed for sleep. Don't take >78m in a day. 30 tablet 3   No current facility-administered medications for this encounter.    BP (!) 150/88 (BP Location: Left Arm, Patient Position: Sitting, Cuff Size: Normal)   Pulse 77   Wt 201 lb (91.2 kg)   SpO2 98%   BMI 37.98 kg/m  General: Elderly, fatigued appearing. No resp difficulty. HEENT: Normal Neck: Supple. JVP to jaw. Carotids 2+ bilat; no bruits. No thyromegaly or nodule noted. Cor: PMI nondisplaced. RRR, No M/G/R noted Lungs: CTAB, normal effort. Abdomen: Soft, non-tender, non-distended, no HSM. No bruits or masses. +BS  Extremities: No cyanosis, clubbing, rash. 2+ edema to knees.  Neuro: Alert & orientedx3, cranial nerves grossly intact. moves all 4 extremities w/o difficulty. Affect tearful    Assessment/Plan: 1. Chronic diastolic CHF:  EF 616-07% has been as low as 35% in the past. Etiology uncertain: could be tachy-mediated cardiomyopathy, but symptoms seemed to predate the onset of atrial fibrillation. Possible viral myocarditis. Cannot rule out ischemic cardiomyopathy, but no definite anginal symptoms. Cardiolite in 11/16 showed a small area possibly of prior infarction but EF and wall motion abnormalities were out of proportion to the perfusion defect.  - NYHA III - Volume elevated on exam. Increase torsemide to 80 mg BID for 3 days, also increase KCl to 463m BID for 3 days. Labs next week.  - Talked with her about reducing ice intake and fluid intake. She is tearful about this and says that she has to have it. I explained that we are increasing her torsemide to remove excess fluid. She will try to reduce ice.  - Continue Toprol XL 2533mID - Our office is working on [employer] 2. Atrial fibrillation: NSR today.  - Continue Eliquis - Toprol XL 25 mg bid.  - Continue Amio. Recent TSH and LFT's stable. Reminded her that she will need regular eye exams.   3. CKD Stage III:  - Check BMET next week.   4. Medtronic PPM for symptomatic bradycardia.   - Stable.   5. CAD: Possible old infarction on Cardiolite. Per Dr. McLAundra Dubin Would not cath with significant CKD and no chest pain.   - Continue statin.  - No ASA with Eliquis.   6. Carotid stenosis: Mild on 7/18 dopplers.   7. HTN:  - Well controlled on current regimen.    8. Diabetes: Follows with PCP.   9. Depression: Asked that she call her PCP. She is tearful, anxious may need to increase Cymbalta or switch to a different agent.   Follow up in 2 weeks. BMET, BNP today  EriArbutus Leas11/2018

## 2017-06-21 NOTE — Patient Instructions (Signed)
INCREASE Torsemide to 80 mg twice a day for 3 days, then resume normal dose of 80 mg daily INCREASE Potassium to 40 MEQ twice a day for 3 days, then resume normal dose of 40 MEQ daily   Labs are needed next week ( Monday 06/27/17)  Your physician recommends that you schedule a follow-up appointment in: 2 weeks with Suzzette Righter, NP   Do the following things EVERYDAY: 1) Weigh yourself in the morning before breakfast. Write it down and keep it in a log. 2) Take your medicines as prescribed 3) Eat low salt foods-Limit salt (sodium) to 2000 mg per day.  4) Stay as active as you can everyday 5) Limit all fluids for the day to less than 2 liters

## 2017-06-21 NOTE — Telephone Encounter (Signed)
[name] (DPR signed) spoke with Melissa at front desk and said pts depression is worsening because pt cannot be active due to heart condition and pt saw cardiology and it was recommended to ck with PCP about increasing Cymbalta. [name] request cb. [facility].

## 2017-06-22 ENCOUNTER — Telehealth: Payer: Self-pay

## 2017-06-22 DIAGNOSIS — I130 Hypertensive heart and chronic kidney disease with heart failure and stage 1 through stage 4 chronic kidney disease, or unspecified chronic kidney disease: Secondary | ICD-10-CM | POA: Diagnosis not present

## 2017-06-22 DIAGNOSIS — R269 Unspecified abnormalities of gait and mobility: Secondary | ICD-10-CM | POA: Diagnosis not present

## 2017-06-22 DIAGNOSIS — E1122 Type 2 diabetes mellitus with diabetic chronic kidney disease: Secondary | ICD-10-CM | POA: Diagnosis not present

## 2017-06-22 DIAGNOSIS — Z950 Presence of cardiac pacemaker: Secondary | ICD-10-CM | POA: Diagnosis not present

## 2017-06-22 DIAGNOSIS — R531 Weakness: Secondary | ICD-10-CM | POA: Diagnosis not present

## 2017-06-22 DIAGNOSIS — I2510 Atherosclerotic heart disease of native coronary artery without angina pectoris: Secondary | ICD-10-CM | POA: Diagnosis not present

## 2017-06-22 DIAGNOSIS — I5022 Chronic systolic (congestive) heart failure: Secondary | ICD-10-CM | POA: Diagnosis not present

## 2017-06-22 DIAGNOSIS — Z9181 History of falling: Secondary | ICD-10-CM | POA: Diagnosis not present

## 2017-06-22 DIAGNOSIS — N183 Chronic kidney disease, stage 3 (moderate): Secondary | ICD-10-CM | POA: Diagnosis not present

## 2017-06-22 DIAGNOSIS — M1991 Primary osteoarthritis, unspecified site: Secondary | ICD-10-CM | POA: Diagnosis not present

## 2017-06-22 DIAGNOSIS — E1142 Type 2 diabetes mellitus with diabetic polyneuropathy: Secondary | ICD-10-CM | POA: Diagnosis not present

## 2017-06-22 DIAGNOSIS — J449 Chronic obstructive pulmonary disease, unspecified: Secondary | ICD-10-CM | POA: Diagnosis not present

## 2017-06-22 DIAGNOSIS — Z794 Long term (current) use of insulin: Secondary | ICD-10-CM | POA: Diagnosis not present

## 2017-06-22 DIAGNOSIS — I4891 Unspecified atrial fibrillation: Secondary | ICD-10-CM | POA: Diagnosis not present

## 2017-06-22 NOTE — Telephone Encounter (Signed)
I would not increase the Cymbalta at this point. The main issue is her kidney function. She has follow-up labs pending to reevaluate her kidney function. If her kidney function continues as is (with significant compromise of renal function) then we should not increase the Cymbalta. We may actually need to take her off of that medication and transition her to another antidepressant. I would get the follow-up labs done first and then go from there.  Thanks.

## 2017-06-22 NOTE — Telephone Encounter (Signed)
Noted. Thanks. Forwarded.

## 2017-06-22 NOTE — Telephone Encounter (Signed)
Patient's daughter [name]) notified as instructed by telephone and verbalized understanding. [name] stated that she will bring her mom in for follow-up lab work as instructed, but wanted Dr. Para March to know that patient has been on Lexapro and Zoloft in the past and neither helped.  [name] stated that she wanted Dr. Para March to know that cardiology has patient taking Torsemide 80 mg twice a day for 3 days because of her mom's weight gain. [name] requested that you forward this note to Denny Peon the PA that saw her mom yesterday so that she will know why Cymbalta is not being increased.

## 2017-06-22 NOTE — Telephone Encounter (Signed)
Esther OT with [facility] left v/m that pt declined receiving HH OT. FYI to Dr Para March.

## 2017-06-22 NOTE — Telephone Encounter (Signed)
Noted. Thanks.

## 2017-06-24 DIAGNOSIS — Z794 Long term (current) use of insulin: Secondary | ICD-10-CM | POA: Diagnosis not present

## 2017-06-24 DIAGNOSIS — I2510 Atherosclerotic heart disease of native coronary artery without angina pectoris: Secondary | ICD-10-CM | POA: Diagnosis not present

## 2017-06-24 DIAGNOSIS — E1122 Type 2 diabetes mellitus with diabetic chronic kidney disease: Secondary | ICD-10-CM | POA: Diagnosis not present

## 2017-06-24 DIAGNOSIS — Z9181 History of falling: Secondary | ICD-10-CM | POA: Diagnosis not present

## 2017-06-24 DIAGNOSIS — I5022 Chronic systolic (congestive) heart failure: Secondary | ICD-10-CM | POA: Diagnosis not present

## 2017-06-24 DIAGNOSIS — R531 Weakness: Secondary | ICD-10-CM | POA: Diagnosis not present

## 2017-06-24 DIAGNOSIS — E1142 Type 2 diabetes mellitus with diabetic polyneuropathy: Secondary | ICD-10-CM | POA: Diagnosis not present

## 2017-06-24 DIAGNOSIS — R269 Unspecified abnormalities of gait and mobility: Secondary | ICD-10-CM | POA: Diagnosis not present

## 2017-06-24 DIAGNOSIS — I130 Hypertensive heart and chronic kidney disease with heart failure and stage 1 through stage 4 chronic kidney disease, or unspecified chronic kidney disease: Secondary | ICD-10-CM | POA: Diagnosis not present

## 2017-06-24 DIAGNOSIS — J449 Chronic obstructive pulmonary disease, unspecified: Secondary | ICD-10-CM | POA: Diagnosis not present

## 2017-06-24 DIAGNOSIS — Z950 Presence of cardiac pacemaker: Secondary | ICD-10-CM | POA: Diagnosis not present

## 2017-06-24 DIAGNOSIS — M1991 Primary osteoarthritis, unspecified site: Secondary | ICD-10-CM | POA: Diagnosis not present

## 2017-06-24 DIAGNOSIS — I4891 Unspecified atrial fibrillation: Secondary | ICD-10-CM | POA: Diagnosis not present

## 2017-06-24 DIAGNOSIS — N183 Chronic kidney disease, stage 3 (moderate): Secondary | ICD-10-CM | POA: Diagnosis not present

## 2017-06-28 ENCOUNTER — Other Ambulatory Visit (INDEPENDENT_AMBULATORY_CARE_PROVIDER_SITE_OTHER): Payer: Medicare Other

## 2017-06-28 ENCOUNTER — Other Ambulatory Visit: Payer: Medicare Other

## 2017-06-28 DIAGNOSIS — M1991 Primary osteoarthritis, unspecified site: Secondary | ICD-10-CM | POA: Diagnosis not present

## 2017-06-28 DIAGNOSIS — R7989 Other specified abnormal findings of blood chemistry: Secondary | ICD-10-CM | POA: Diagnosis not present

## 2017-06-28 DIAGNOSIS — I4891 Unspecified atrial fibrillation: Secondary | ICD-10-CM | POA: Diagnosis not present

## 2017-06-28 DIAGNOSIS — R531 Weakness: Secondary | ICD-10-CM | POA: Diagnosis not present

## 2017-06-28 DIAGNOSIS — I2510 Atherosclerotic heart disease of native coronary artery without angina pectoris: Secondary | ICD-10-CM | POA: Diagnosis not present

## 2017-06-28 DIAGNOSIS — Z950 Presence of cardiac pacemaker: Secondary | ICD-10-CM | POA: Diagnosis not present

## 2017-06-28 DIAGNOSIS — R269 Unspecified abnormalities of gait and mobility: Secondary | ICD-10-CM | POA: Diagnosis not present

## 2017-06-28 DIAGNOSIS — I130 Hypertensive heart and chronic kidney disease with heart failure and stage 1 through stage 4 chronic kidney disease, or unspecified chronic kidney disease: Secondary | ICD-10-CM | POA: Diagnosis not present

## 2017-06-28 DIAGNOSIS — Z9181 History of falling: Secondary | ICD-10-CM | POA: Diagnosis not present

## 2017-06-28 DIAGNOSIS — J449 Chronic obstructive pulmonary disease, unspecified: Secondary | ICD-10-CM | POA: Diagnosis not present

## 2017-06-28 DIAGNOSIS — I5022 Chronic systolic (congestive) heart failure: Secondary | ICD-10-CM | POA: Diagnosis not present

## 2017-06-28 DIAGNOSIS — Z794 Long term (current) use of insulin: Secondary | ICD-10-CM | POA: Diagnosis not present

## 2017-06-28 DIAGNOSIS — E1122 Type 2 diabetes mellitus with diabetic chronic kidney disease: Secondary | ICD-10-CM | POA: Diagnosis not present

## 2017-06-28 DIAGNOSIS — N183 Chronic kidney disease, stage 3 (moderate): Secondary | ICD-10-CM | POA: Diagnosis not present

## 2017-06-28 DIAGNOSIS — E1142 Type 2 diabetes mellitus with diabetic polyneuropathy: Secondary | ICD-10-CM | POA: Diagnosis not present

## 2017-06-28 LAB — BASIC METABOLIC PANEL
BUN: 32 mg/dL — AB (ref 6–23)
CO2: 32 mEq/L (ref 19–32)
Calcium: 8.8 mg/dL (ref 8.4–10.5)
Chloride: 98 mEq/L (ref 96–112)
Creatinine, Ser: 2.34 mg/dL — ABNORMAL HIGH (ref 0.40–1.20)
GFR: 21.07 mL/min — AB (ref >60.00)
GLUCOSE: 207 mg/dL — AB (ref 70–99)
Potassium: 4.5 mEq/L (ref 3.5–5.1)
Sodium: 138 mEq/L (ref 135–145)

## 2017-06-30 DIAGNOSIS — Z9181 History of falling: Secondary | ICD-10-CM | POA: Diagnosis not present

## 2017-06-30 DIAGNOSIS — E1142 Type 2 diabetes mellitus with diabetic polyneuropathy: Secondary | ICD-10-CM | POA: Diagnosis not present

## 2017-06-30 DIAGNOSIS — M1991 Primary osteoarthritis, unspecified site: Secondary | ICD-10-CM | POA: Diagnosis not present

## 2017-06-30 DIAGNOSIS — E1122 Type 2 diabetes mellitus with diabetic chronic kidney disease: Secondary | ICD-10-CM | POA: Diagnosis not present

## 2017-06-30 DIAGNOSIS — I4891 Unspecified atrial fibrillation: Secondary | ICD-10-CM | POA: Diagnosis not present

## 2017-06-30 DIAGNOSIS — R531 Weakness: Secondary | ICD-10-CM | POA: Diagnosis not present

## 2017-06-30 DIAGNOSIS — R269 Unspecified abnormalities of gait and mobility: Secondary | ICD-10-CM | POA: Diagnosis not present

## 2017-06-30 DIAGNOSIS — I130 Hypertensive heart and chronic kidney disease with heart failure and stage 1 through stage 4 chronic kidney disease, or unspecified chronic kidney disease: Secondary | ICD-10-CM | POA: Diagnosis not present

## 2017-06-30 DIAGNOSIS — J449 Chronic obstructive pulmonary disease, unspecified: Secondary | ICD-10-CM | POA: Diagnosis not present

## 2017-06-30 DIAGNOSIS — N183 Chronic kidney disease, stage 3 (moderate): Secondary | ICD-10-CM | POA: Diagnosis not present

## 2017-06-30 DIAGNOSIS — I2510 Atherosclerotic heart disease of native coronary artery without angina pectoris: Secondary | ICD-10-CM | POA: Diagnosis not present

## 2017-06-30 DIAGNOSIS — Z950 Presence of cardiac pacemaker: Secondary | ICD-10-CM | POA: Diagnosis not present

## 2017-06-30 DIAGNOSIS — Z794 Long term (current) use of insulin: Secondary | ICD-10-CM | POA: Diagnosis not present

## 2017-06-30 DIAGNOSIS — I5022 Chronic systolic (congestive) heart failure: Secondary | ICD-10-CM | POA: Diagnosis not present

## 2017-07-01 ENCOUNTER — Other Ambulatory Visit: Payer: Self-pay | Admitting: Family Medicine

## 2017-07-01 ENCOUNTER — Telehealth: Payer: Self-pay | Admitting: Family Medicine

## 2017-07-01 DIAGNOSIS — I130 Hypertensive heart and chronic kidney disease with heart failure and stage 1 through stage 4 chronic kidney disease, or unspecified chronic kidney disease: Secondary | ICD-10-CM | POA: Diagnosis not present

## 2017-07-01 DIAGNOSIS — E1142 Type 2 diabetes mellitus with diabetic polyneuropathy: Secondary | ICD-10-CM | POA: Diagnosis not present

## 2017-07-01 DIAGNOSIS — I4891 Unspecified atrial fibrillation: Secondary | ICD-10-CM | POA: Diagnosis not present

## 2017-07-01 DIAGNOSIS — I5022 Chronic systolic (congestive) heart failure: Secondary | ICD-10-CM | POA: Diagnosis not present

## 2017-07-01 DIAGNOSIS — Z950 Presence of cardiac pacemaker: Secondary | ICD-10-CM | POA: Diagnosis not present

## 2017-07-01 DIAGNOSIS — Z794 Long term (current) use of insulin: Secondary | ICD-10-CM | POA: Diagnosis not present

## 2017-07-01 DIAGNOSIS — M1991 Primary osteoarthritis, unspecified site: Secondary | ICD-10-CM | POA: Diagnosis not present

## 2017-07-01 DIAGNOSIS — I2510 Atherosclerotic heart disease of native coronary artery without angina pectoris: Secondary | ICD-10-CM | POA: Diagnosis not present

## 2017-07-01 DIAGNOSIS — J449 Chronic obstructive pulmonary disease, unspecified: Secondary | ICD-10-CM | POA: Diagnosis not present

## 2017-07-01 DIAGNOSIS — E1122 Type 2 diabetes mellitus with diabetic chronic kidney disease: Secondary | ICD-10-CM | POA: Diagnosis not present

## 2017-07-01 DIAGNOSIS — Z9181 History of falling: Secondary | ICD-10-CM | POA: Diagnosis not present

## 2017-07-01 DIAGNOSIS — N183 Chronic kidney disease, stage 3 (moderate): Secondary | ICD-10-CM | POA: Diagnosis not present

## 2017-07-01 DIAGNOSIS — R531 Weakness: Secondary | ICD-10-CM | POA: Diagnosis not present

## 2017-07-01 DIAGNOSIS — R269 Unspecified abnormalities of gait and mobility: Secondary | ICD-10-CM | POA: Diagnosis not present

## 2017-07-01 MED ORDER — BUPROPION HCL 75 MG PO TABS: 75.0000 mg | ORAL_TABLET | Freq: Two times a day (BID) | ORAL | 1 refills | Status: AC

## 2017-07-01 NOTE — Telephone Encounter (Addendum)
Returned call.  Daughter asks if Danielle Benitez is being taken off of Cymbalta because of the kidney function or for the depression?  Daughter says she thinks the Cymbalta is working and she is hesitant to take her off because other antidepressants in the past have either caused decrease in appetite or violence.  However, if it is because of her kidney function, she is certainly willing to do so.

## 2017-07-01 NOTE — Telephone Encounter (Signed)
Due to kidney function.  It would be my preference to inc the cymbalta given the mood changes, but the renal situation prevents that.  Thanks.

## 2017-07-01 NOTE — Telephone Encounter (Signed)
Caller Name:[name] Brewer  Relationship to Patient:daughter  Best [phone]  Pharmacy:[facility]  Reason for call:pt's daughter returned call from [place] re: mother's labs.  Best number to call [name] is [phone]

## 2017-07-01 NOTE — Telephone Encounter (Signed)
Daughter advised.

## 2017-07-04 ENCOUNTER — Ambulatory Visit (INDEPENDENT_AMBULATORY_CARE_PROVIDER_SITE_OTHER): Payer: Medicare Other | Admitting: Family Medicine

## 2017-07-04 ENCOUNTER — Encounter: Payer: Self-pay | Admitting: Family Medicine

## 2017-07-04 VITALS — BP 122/62 | HR 74 | Temp 97.8°F

## 2017-07-04 DIAGNOSIS — J069 Acute upper respiratory infection, unspecified: Secondary | ICD-10-CM

## 2017-07-04 LAB — CBC WITH DIFFERENTIAL/PLATELET
BASOS PCT: 1.4 % (ref 0.0–3.0)
Basophils Absolute: 0.1 10*3/uL (ref 0.0–0.1)
EOS ABS: 0.1 10*3/uL (ref 0.0–0.7)
EOS PCT: 1.6 % (ref 0.0–5.0)
HCT: 33.9 % — ABNORMAL LOW (ref 36.0–46.0)
HEMOGLOBIN: 10.5 g/dL — AB (ref 12.0–15.0)
Lymphocytes Relative: 22.7 % (ref 12.0–46.0)
Lymphs Abs: 2 10*3/uL (ref 0.7–4.0)
MCHC: 30.9 g/dL (ref 30.0–36.0)
MCV: 96.3 fl (ref 78.0–100.0)
MONO ABS: 1 10*3/uL (ref 0.1–1.0)
Monocytes Relative: 11.4 % (ref 3.0–12.0)
NEUTROS ABS: 5.5 10*3/uL (ref 1.4–7.7)
Neutrophils Relative %: 62.9 % (ref 43.0–77.0)
Platelets: 113 10*3/uL — ABNORMAL LOW (ref 150.0–400.0)
RBC: 3.52 Mil/uL — ABNORMAL LOW (ref 3.87–5.11)
RDW: 20.9 % — ABNORMAL HIGH (ref 11.5–15.5)
WBC: 8.8 10*3/uL (ref 4.0–10.5)

## 2017-07-04 LAB — BASIC METABOLIC PANEL
BUN: 33 mg/dL — AB (ref 6–23)
CHLORIDE: 101 meq/L (ref 96–112)
CO2: 32 mEq/L (ref 19–32)
CREATININE: 2.33 mg/dL — AB (ref 0.40–1.20)
Calcium: 9.8 mg/dL (ref 8.4–10.5)
GFR: 21.18 mL/min — ABNORMAL LOW (ref >60.00)
Glucose, Bld: 302 mg/dL — ABNORMAL HIGH (ref 70–99)
POTASSIUM: 4.6 meq/L (ref 3.5–5.1)
Sodium: 139 mEq/L (ref 135–145)

## 2017-07-04 NOTE — Progress Notes (Signed)
Sx started about 3 days ago.  Had congestion in her head initially.  Nasal voice.  Brown sputum and nasal discharge.  No vomiting.  Some nausea. Appetite is down.  Not burning with urination but has urgency w/o some UOP some of the time.  Other times her voiding is normal.  More confusion.  More fatigue.  Not sleeping well.    Was changed to wellbutrin due to CrCl, d/w pt and daughter today.    Weight is down from ~200 to ~185 lbs.  She was changed to torsemide from furosemide.   Meds, vitals, and allergies reviewed.   ROS: Per HPI unless specifically indicated in ROS section   GEN: nad, alert but looks like she doesn't feel well, in wheelchair HEENT: mucous membranes moist, tm w/o erythema, nasal exam w/o erythema but adherent scab from prev nosebleed noted B,no active bleeding, some clear discharge noted,  OP with mild cobblestoning NECK: supple w/o LA CV: rrr.   PULM: ctab, no inc wob, no focal dec in BS EXT: trace BLE edema

## 2017-07-04 NOTE — Patient Instructions (Signed)
Likely a virus but check labs today. We may need to change the torsemide dose in the near future.  Update me tomorrow.  Take care.  Glad to see you.

## 2017-07-05 ENCOUNTER — Encounter (HOSPITAL_COMMUNITY): Payer: Medicare Other

## 2017-07-05 ENCOUNTER — Encounter: Payer: Self-pay | Admitting: Family Medicine

## 2017-07-06 ENCOUNTER — Telehealth: Payer: Self-pay

## 2017-07-06 DIAGNOSIS — Z9181 History of falling: Secondary | ICD-10-CM | POA: Diagnosis not present

## 2017-07-06 DIAGNOSIS — I130 Hypertensive heart and chronic kidney disease with heart failure and stage 1 through stage 4 chronic kidney disease, or unspecified chronic kidney disease: Secondary | ICD-10-CM | POA: Diagnosis not present

## 2017-07-06 DIAGNOSIS — R531 Weakness: Secondary | ICD-10-CM | POA: Diagnosis not present

## 2017-07-06 DIAGNOSIS — I5022 Chronic systolic (congestive) heart failure: Secondary | ICD-10-CM | POA: Diagnosis not present

## 2017-07-06 DIAGNOSIS — E1142 Type 2 diabetes mellitus with diabetic polyneuropathy: Secondary | ICD-10-CM | POA: Diagnosis not present

## 2017-07-06 DIAGNOSIS — J449 Chronic obstructive pulmonary disease, unspecified: Secondary | ICD-10-CM | POA: Diagnosis not present

## 2017-07-06 DIAGNOSIS — E1122 Type 2 diabetes mellitus with diabetic chronic kidney disease: Secondary | ICD-10-CM | POA: Diagnosis not present

## 2017-07-06 DIAGNOSIS — N183 Chronic kidney disease, stage 3 (moderate): Secondary | ICD-10-CM | POA: Diagnosis not present

## 2017-07-06 DIAGNOSIS — Z950 Presence of cardiac pacemaker: Secondary | ICD-10-CM | POA: Diagnosis not present

## 2017-07-06 DIAGNOSIS — I2510 Atherosclerotic heart disease of native coronary artery without angina pectoris: Secondary | ICD-10-CM | POA: Diagnosis not present

## 2017-07-06 DIAGNOSIS — R269 Unspecified abnormalities of gait and mobility: Secondary | ICD-10-CM | POA: Diagnosis not present

## 2017-07-06 DIAGNOSIS — I4891 Unspecified atrial fibrillation: Secondary | ICD-10-CM | POA: Diagnosis not present

## 2017-07-06 DIAGNOSIS — Z794 Long term (current) use of insulin: Secondary | ICD-10-CM | POA: Diagnosis not present

## 2017-07-06 DIAGNOSIS — M1991 Primary osteoarthritis, unspecified site: Secondary | ICD-10-CM | POA: Diagnosis not present

## 2017-07-06 NOTE — Telephone Encounter (Signed)
Please notify patient's daughter that Dr. Para March is out of the office.  Is she having any other symptoms other than confusion? Any fevers? Cough? Congestion? Shortness of breath? Abnormal breath sounds?

## 2017-07-06 NOTE — Telephone Encounter (Signed)
Danielle Benitez pts daughter (DPR signed) said pt was seen 07/04/17; on 07/05/17 in evening pts mental confusion seemed to clear; pt knew where she was and sat up for awhile and watched TV with daughter; pt interacted appropriately; pt went to bed last night about 9 PM and continued same mental status as earlier in evening. This morning pt woke up at 9:45 as confused as she was when seen on 07/04/17. Pt has her bottom teeth in upside down but pt saying the teeth are in correctly. This morning if pts daughter had not been there to move pts walker which has a seat on it pt would have sat in floor. Pt is very disorientated and is yelling at daughter. Pt is not on abx. Danielle Benitez request cb with what to do? [place].

## 2017-07-07 ENCOUNTER — Emergency Department (HOSPITAL_COMMUNITY): Payer: Medicare Other

## 2017-07-07 ENCOUNTER — Encounter (HOSPITAL_COMMUNITY): Payer: Self-pay | Admitting: *Deleted

## 2017-07-07 ENCOUNTER — Inpatient Hospital Stay (HOSPITAL_COMMUNITY)
Admission: EM | Admit: 2017-07-07 | Discharge: 2017-07-09 | DRG: 689 | Disposition: A | Discharge status: Home Health | Payer: Medicare Other | Attending: Internal Medicine | Admitting: Internal Medicine

## 2017-07-07 DIAGNOSIS — N390 Urinary tract infection, site not specified: Secondary | ICD-10-CM | POA: Diagnosis not present

## 2017-07-07 DIAGNOSIS — Z8673 Personal history of transient ischemic attack (TIA), and cerebral infarction without residual deficits: Secondary | ICD-10-CM

## 2017-07-07 DIAGNOSIS — Z803 Family history of malignant neoplasm of breast: Secondary | ICD-10-CM

## 2017-07-07 DIAGNOSIS — Z1619 Resistance to other specified beta lactam antibiotics: Secondary | ICD-10-CM

## 2017-07-07 DIAGNOSIS — D696 Thrombocytopenia, unspecified: Secondary | ICD-10-CM | POA: Diagnosis not present

## 2017-07-07 DIAGNOSIS — I480 Paroxysmal atrial fibrillation: Secondary | ICD-10-CM | POA: Diagnosis not present

## 2017-07-07 DIAGNOSIS — Z9841 Cataract extraction status, right eye: Secondary | ICD-10-CM

## 2017-07-07 DIAGNOSIS — K219 Gastro-esophageal reflux disease without esophagitis: Secondary | ICD-10-CM | POA: Diagnosis present

## 2017-07-07 DIAGNOSIS — I130 Hypertensive heart and chronic kidney disease with heart failure and stage 1 through stage 4 chronic kidney disease, or unspecified chronic kidney disease: Secondary | ICD-10-CM | POA: Diagnosis present

## 2017-07-07 DIAGNOSIS — E1129 Type 2 diabetes mellitus with other diabetic kidney complication: Secondary | ICD-10-CM | POA: Diagnosis present

## 2017-07-07 DIAGNOSIS — N184 Chronic kidney disease, stage 4 (severe): Secondary | ICD-10-CM | POA: Diagnosis not present

## 2017-07-07 DIAGNOSIS — H9190 Unspecified hearing loss, unspecified ear: Secondary | ICD-10-CM | POA: Diagnosis present

## 2017-07-07 DIAGNOSIS — E1122 Type 2 diabetes mellitus with diabetic chronic kidney disease: Secondary | ICD-10-CM | POA: Diagnosis not present

## 2017-07-07 DIAGNOSIS — D638 Anemia in other chronic diseases classified elsewhere: Secondary | ICD-10-CM | POA: Diagnosis not present

## 2017-07-07 DIAGNOSIS — Z950 Presence of cardiac pacemaker: Secondary | ICD-10-CM

## 2017-07-07 DIAGNOSIS — G9341 Metabolic encephalopathy: Secondary | ICD-10-CM | POA: Diagnosis not present

## 2017-07-07 DIAGNOSIS — W19XXXA Unspecified fall, initial encounter: Secondary | ICD-10-CM | POA: Diagnosis not present

## 2017-07-07 DIAGNOSIS — E1121 Type 2 diabetes mellitus with diabetic nephropathy: Secondary | ICD-10-CM | POA: Diagnosis not present

## 2017-07-07 DIAGNOSIS — E1022 Type 1 diabetes mellitus with diabetic chronic kidney disease: Secondary | ICD-10-CM | POA: Diagnosis not present

## 2017-07-07 DIAGNOSIS — J069 Acute upper respiratory infection, unspecified: Secondary | ICD-10-CM | POA: Insufficient documentation

## 2017-07-07 DIAGNOSIS — Z23 Encounter for immunization: Secondary | ICD-10-CM

## 2017-07-07 DIAGNOSIS — E1165 Type 2 diabetes mellitus with hyperglycemia: Secondary | ICD-10-CM

## 2017-07-07 DIAGNOSIS — F329 Major depressive disorder, single episode, unspecified: Secondary | ICD-10-CM | POA: Diagnosis not present

## 2017-07-07 DIAGNOSIS — N183 Chronic kidney disease, stage 3 unspecified: Secondary | ICD-10-CM | POA: Diagnosis present

## 2017-07-07 DIAGNOSIS — I2510 Atherosclerotic heart disease of native coronary artery without angina pectoris: Secondary | ICD-10-CM | POA: Diagnosis present

## 2017-07-07 DIAGNOSIS — M1990 Unspecified osteoarthritis, unspecified site: Secondary | ICD-10-CM | POA: Diagnosis present

## 2017-07-07 DIAGNOSIS — I482 Chronic atrial fibrillation: Secondary | ICD-10-CM | POA: Diagnosis not present

## 2017-07-07 DIAGNOSIS — D649 Anemia, unspecified: Secondary | ICD-10-CM | POA: Diagnosis present

## 2017-07-07 DIAGNOSIS — R938 Abnormal findings on diagnostic imaging of other specified body structures: Secondary | ICD-10-CM | POA: Diagnosis not present

## 2017-07-07 DIAGNOSIS — Z8249 Family history of ischemic heart disease and other diseases of the circulatory system: Secondary | ICD-10-CM

## 2017-07-07 DIAGNOSIS — Z9181 History of falling: Secondary | ICD-10-CM | POA: Diagnosis present

## 2017-07-07 DIAGNOSIS — Z7901 Long term (current) use of anticoagulants: Secondary | ICD-10-CM

## 2017-07-07 DIAGNOSIS — F32A Depression, unspecified: Secondary | ICD-10-CM | POA: Diagnosis present

## 2017-07-07 DIAGNOSIS — J189 Pneumonia, unspecified organism: Secondary | ICD-10-CM | POA: Diagnosis present

## 2017-07-07 DIAGNOSIS — M109 Gout, unspecified: Secondary | ICD-10-CM | POA: Diagnosis present

## 2017-07-07 DIAGNOSIS — Z9842 Cataract extraction status, left eye: Secondary | ICD-10-CM

## 2017-07-07 DIAGNOSIS — T148XXA Other injury of unspecified body region, initial encounter: Secondary | ICD-10-CM | POA: Diagnosis not present

## 2017-07-07 DIAGNOSIS — B9689 Other specified bacterial agents as the cause of diseases classified elsewhere: Secondary | ICD-10-CM | POA: Diagnosis present

## 2017-07-07 DIAGNOSIS — M7989 Other specified soft tissue disorders: Secondary | ICD-10-CM | POA: Diagnosis not present

## 2017-07-07 DIAGNOSIS — E860 Dehydration: Secondary | ICD-10-CM | POA: Diagnosis present

## 2017-07-07 DIAGNOSIS — M25572 Pain in left ankle and joints of left foot: Secondary | ICD-10-CM | POA: Diagnosis not present

## 2017-07-07 DIAGNOSIS — R5383 Other fatigue: Secondary | ICD-10-CM | POA: Diagnosis present

## 2017-07-07 DIAGNOSIS — I5032 Chronic diastolic (congestive) heart failure: Secondary | ICD-10-CM | POA: Diagnosis not present

## 2017-07-07 DIAGNOSIS — Z794 Long term (current) use of insulin: Secondary | ICD-10-CM

## 2017-07-07 DIAGNOSIS — Z961 Presence of intraocular lens: Secondary | ICD-10-CM | POA: Diagnosis present

## 2017-07-07 DIAGNOSIS — I350 Nonrheumatic aortic (valve) stenosis: Secondary | ICD-10-CM | POA: Diagnosis not present

## 2017-07-07 DIAGNOSIS — Z888 Allergy status to other drugs, medicaments and biological substances status: Secondary | ICD-10-CM

## 2017-07-07 DIAGNOSIS — E785 Hyperlipidemia, unspecified: Secondary | ICD-10-CM | POA: Diagnosis not present

## 2017-07-07 DIAGNOSIS — IMO0002 Reserved for concepts with insufficient information to code with codable children: Secondary | ICD-10-CM | POA: Diagnosis present

## 2017-07-07 DIAGNOSIS — I252 Old myocardial infarction: Secondary | ICD-10-CM | POA: Diagnosis not present

## 2017-07-07 DIAGNOSIS — R410 Disorientation, unspecified: Secondary | ICD-10-CM | POA: Diagnosis present

## 2017-07-07 DIAGNOSIS — Z87440 Personal history of urinary (tract) infections: Secondary | ICD-10-CM

## 2017-07-07 DIAGNOSIS — R9389 Abnormal findings on diagnostic imaging of other specified body structures: Secondary | ICD-10-CM

## 2017-07-07 DIAGNOSIS — M25561 Pain in right knee: Secondary | ICD-10-CM | POA: Diagnosis not present

## 2017-07-07 DIAGNOSIS — E1140 Type 2 diabetes mellitus with diabetic neuropathy, unspecified: Secondary | ICD-10-CM | POA: Diagnosis not present

## 2017-07-07 DIAGNOSIS — R0602 Shortness of breath: Secondary | ICD-10-CM | POA: Diagnosis not present

## 2017-07-07 DIAGNOSIS — J13 Pneumonia due to Streptococcus pneumoniae: Secondary | ICD-10-CM | POA: Diagnosis not present

## 2017-07-07 DIAGNOSIS — S99912A Unspecified injury of left ankle, initial encounter: Secondary | ICD-10-CM | POA: Diagnosis not present

## 2017-07-07 DIAGNOSIS — Z833 Family history of diabetes mellitus: Secondary | ICD-10-CM

## 2017-07-07 DIAGNOSIS — I5022 Chronic systolic (congestive) heart failure: Secondary | ICD-10-CM | POA: Diagnosis present

## 2017-07-07 DIAGNOSIS — R296 Repeated falls: Secondary | ICD-10-CM | POA: Diagnosis present

## 2017-07-07 LAB — URINALYSIS, ROUTINE W REFLEX MICROSCOPIC
Bilirubin Urine: NEGATIVE
GLUCOSE, UA: NEGATIVE mg/dL
Hgb urine dipstick: NEGATIVE
Ketones, ur: NEGATIVE mg/dL
NITRITE: NEGATIVE
PROTEIN: NEGATIVE mg/dL
Specific Gravity, Urine: 1.011 (ref 1.005–1.030)
pH: 6 (ref 5.0–8.0)

## 2017-07-07 LAB — COMPREHENSIVE METABOLIC PANEL
ALBUMIN: 2.5 g/dL — AB (ref 3.5–5.0)
ALK PHOS: 132 U/L — AB (ref 38–126)
ALT: 30 U/L (ref 14–54)
ANION GAP: 12 (ref 5–15)
AST: 70 U/L — ABNORMAL HIGH (ref 15–41)
BUN: 32 mg/dL — ABNORMAL HIGH (ref 6–20)
CALCIUM: 8.9 mg/dL (ref 8.9–10.3)
CHLORIDE: 97 mmol/L — AB (ref 101–111)
CO2: 26 mmol/L (ref 22–32)
Creatinine, Ser: 2.58 mg/dL — ABNORMAL HIGH (ref 0.44–1.00)
GFR calc non Af Amer: 16 mL/min — ABNORMAL LOW (ref >60)
GFR, EST AFRICAN AMERICAN: 19 mL/min — AB (ref >60)
Glucose, Bld: 203 mg/dL — ABNORMAL HIGH (ref 65–99)
POTASSIUM: 4.1 mmol/L (ref 3.5–5.1)
SODIUM: 135 mmol/L (ref 135–145)
Total Bilirubin: 1.7 mg/dL — ABNORMAL HIGH (ref 0.3–1.2)
Total Protein: 7.5 g/dL (ref 6.5–8.1)

## 2017-07-07 LAB — CBC WITH DIFFERENTIAL/PLATELET
BASOS PCT: 1 %
Basophils Absolute: 0.1 10*3/uL (ref 0.0–0.1)
Eosinophils Absolute: 0.1 10*3/uL (ref 0.0–0.7)
Eosinophils Relative: 1 %
HEMATOCRIT: 33.8 % — AB (ref 36.0–46.0)
HEMOGLOBIN: 10.8 g/dL — AB (ref 12.0–15.0)
LYMPHS PCT: 23 %
Lymphs Abs: 2.1 10*3/uL (ref 0.7–4.0)
MCH: 29.7 pg (ref 26.0–34.0)
MCHC: 32 g/dL (ref 30.0–36.0)
MCV: 92.9 fL (ref 78.0–100.0)
MONOS PCT: 7 %
Monocytes Absolute: 0.7 10*3/uL (ref 0.1–1.0)
NEUTROS ABS: 6.1 10*3/uL (ref 1.7–7.7)
NEUTROS PCT: 68 %
Platelets: 101 10*3/uL — ABNORMAL LOW (ref 150–400)
RBC: 3.64 MIL/uL — ABNORMAL LOW (ref 3.87–5.11)
RDW: 19.7 % — ABNORMAL HIGH (ref 11.5–15.5)
WBC: 9 10*3/uL (ref 4.0–10.5)

## 2017-07-07 LAB — GLUCOSE, CAPILLARY
GLUCOSE-CAPILLARY: 202 mg/dL — AB (ref 65–99)
Glucose-Capillary: 109 mg/dL — ABNORMAL HIGH (ref 65–99)

## 2017-07-07 LAB — LACTIC ACID, PLASMA: Lactic Acid, Venous: 2.3 mmol/L (ref 0.5–1.9)

## 2017-07-07 MED ORDER — ONDANSETRON HCL 4 MG PO TABS: 4.0000 mg | ORAL_TABLET | Freq: Four times a day (QID) | ORAL | Status: DC | PRN

## 2017-07-07 MED ORDER — BUPROPION HCL 75 MG PO TABS
75.0000 mg | ORAL_TABLET | Freq: Two times a day (BID) | ORAL | Status: DC
  Administered 2017-07-07 – 2017-07-09 (×4): 75 mg via ORAL
  Filled 2017-07-07 (×4): qty 1

## 2017-07-07 MED ORDER — ACETAMINOPHEN 325 MG PO TABS: 650.0000 mg | ORAL_TABLET | Freq: Four times a day (QID) | ORAL | Status: DC | PRN

## 2017-07-07 MED ORDER — SODIUM CHLORIDE 0.9 % IV SOLN
INTRAVENOUS | Status: DC
  Administered 2017-07-07: 14:00:00 via INTRAVENOUS

## 2017-07-07 MED ORDER — ACETAMINOPHEN 650 MG RE SUPP: 650.0000 mg | Freq: Four times a day (QID) | RECTAL | Status: DC | PRN

## 2017-07-07 MED ORDER — AMIODARONE HCL 200 MG PO TABS
200.0000 mg | ORAL_TABLET | Freq: Every day | ORAL | Status: DC
  Administered 2017-07-07 – 2017-07-09 (×3): 200 mg via ORAL
  Filled 2017-07-07 (×3): qty 1

## 2017-07-07 MED ORDER — ALBUTEROL SULFATE (2.5 MG/3ML) 0.083% IN NEBU: 3.0000 mL | INHALATION_SOLUTION | RESPIRATORY_TRACT | Status: DC | PRN

## 2017-07-07 MED ORDER — CEFTRIAXONE SODIUM 1 G IJ SOLR
1.0000 g | INTRAMUSCULAR | Status: DC
  Administered 2017-07-08: 1 g via INTRAVENOUS
  Filled 2017-07-07 (×2): qty 10

## 2017-07-07 MED ORDER — INSULIN GLARGINE 100 UNIT/ML ~~LOC~~ SOLN
25.0000 [IU] | Freq: Every day | SUBCUTANEOUS | Status: DC
  Administered 2017-07-07 – 2017-07-08 (×2): 25 [IU] via SUBCUTANEOUS
  Filled 2017-07-07 (×3): qty 0.25

## 2017-07-07 MED ORDER — ALLOPURINOL 300 MG PO TABS
300.0000 mg | ORAL_TABLET | Freq: Every day | ORAL | Status: DC
  Administered 2017-07-08: 300 mg via ORAL
  Filled 2017-07-07: qty 1

## 2017-07-07 MED ORDER — METOPROLOL SUCCINATE ER 25 MG PO TB24
25.0000 mg | ORAL_TABLET | Freq: Two times a day (BID) | ORAL | Status: DC
  Administered 2017-07-07 – 2017-07-09 (×4): 25 mg via ORAL
  Filled 2017-07-07 (×4): qty 1

## 2017-07-07 MED ORDER — DEXTROSE 5 % IV SOLN
1.0000 g | Freq: Once | INTRAVENOUS | Status: AC
  Administered 2017-07-07: 1 g via INTRAVENOUS
  Filled 2017-07-07: qty 10

## 2017-07-07 MED ORDER — INSULIN ASPART 100 UNIT/ML ~~LOC~~ SOLN
0.0000 [IU] | Freq: Three times a day (TID) | SUBCUTANEOUS | Status: DC
  Administered 2017-07-08: 9 [IU] via SUBCUTANEOUS
  Administered 2017-07-08: 3 [IU] via SUBCUTANEOUS
  Administered 2017-07-09: 2 [IU] via SUBCUTANEOUS
  Administered 2017-07-09: 1 [IU] via SUBCUTANEOUS

## 2017-07-07 MED ORDER — ATORVASTATIN CALCIUM 20 MG PO TABS
20.0000 mg | ORAL_TABLET | Freq: Every day | ORAL | Status: DC
  Administered 2017-07-07 – 2017-07-08 (×2): 20 mg via ORAL
  Filled 2017-07-07 (×2): qty 1

## 2017-07-07 MED ORDER — SODIUM CHLORIDE 0.9 % IV BOLUS (SEPSIS)
500.0000 mL | Freq: Once | INTRAVENOUS | Status: AC
  Administered 2017-07-07: 500 mL via INTRAVENOUS

## 2017-07-07 MED ORDER — ONDANSETRON HCL 4 MG/2ML IJ SOLN
4.0000 mg | Freq: Four times a day (QID) | INTRAMUSCULAR | Status: DC | PRN
Start: 2017-07-07 — End: 2017-07-09

## 2017-07-07 MED ORDER — PANTOPRAZOLE SODIUM 40 MG PO TBEC
40.0000 mg | DELAYED_RELEASE_TABLET | Freq: Every day | ORAL | Status: DC
  Administered 2017-07-07 – 2017-07-09 (×3): 40 mg via ORAL
  Filled 2017-07-07 (×3): qty 1

## 2017-07-07 MED ORDER — BISACODYL 10 MG RE SUPP: 10.0000 mg | Freq: Every day | RECTAL | Status: DC | PRN

## 2017-07-07 MED ORDER — SODIUM CHLORIDE 0.9 % IV SOLN
Freq: Once | INTRAVENOUS | Status: AC
  Administered 2017-07-07: 12:00:00 via INTRAVENOUS

## 2017-07-07 MED ORDER — INSULIN GLARGINE 100 UNIT/ML SOLOSTAR PEN: 16.0000 [IU] | PEN_INJECTOR | SUBCUTANEOUS | Status: DC

## 2017-07-07 MED ORDER — TRAZODONE HCL 50 MG PO TABS: 25.0000 mg | ORAL_TABLET | Freq: Every evening | ORAL | Status: DC | PRN

## 2017-07-07 MED ORDER — APIXABAN 2.5 MG PO TABS
2.5000 mg | ORAL_TABLET | Freq: Two times a day (BID) | ORAL | Status: DC
  Administered 2017-07-07 – 2017-07-09 (×4): 2.5 mg via ORAL
  Filled 2017-07-07 (×4): qty 1

## 2017-07-07 MED ORDER — SENNOSIDES-DOCUSATE SODIUM 8.6-50 MG PO TABS
1.0000 | ORAL_TABLET | Freq: Every evening | ORAL | Status: DC | PRN
  Administered 2017-07-08: 1 via ORAL

## 2017-07-07 MED ORDER — HYDROCODONE-ACETAMINOPHEN 5-325 MG PO TABS: 1.0000 | ORAL_TABLET | ORAL | Status: DC | PRN

## 2017-07-07 NOTE — Assessment & Plan Note (Signed)
Looks to be a benign URI/virus but given her baseline situation would check labs today. We may need to change the torsemide dose in the near future.  See notes on labs.  If WBC elevated would have low threshold to start abx.  Likely with confusion from recent illness, as expected.  D/w pt's daughter and at this point still okay for outpatient f/u.  They will update me as the week goes along.

## 2017-07-07 NOTE — ED Notes (Signed)
Attempted report x 3.  

## 2017-07-07 NOTE — ED Triage Notes (Signed)
Pt in from home after a mechanical fall, c/o pain to her left ankle, pt family reports that she had n/v/d over the weekend that resolved on Monday, pt alert and oriented at this time, no distress noted    CBG 235

## 2017-07-07 NOTE — ED Provider Notes (Signed)
[facility] DEPT Provider Note   CSN: [id number] Arrival date & time: 07/07/17  8889     History   Chief Complaint Chief Complaint  Patient presents with  . Fall  . Altered Mental Status    HPI Danielle Benitez is a 83 y.o. female.  HPI  Patient presents from home via EMS due to concern of altered mental status and fall. Patient has falls frequently, reportedly, and had one earlier today. Apparently the patient fell onto her knees, is unclear if she struck her head. Reportedly the patient also has been less interactive, less verbal, with anorexia, nausea, vomiting, diarrhea over the past 2 days. The patient herself describes pain in her knees bilaterally, denies head pain, denies weakness or extremities, states that she feels poorly in general, has weakness all over. She is oriented appropriately.  Eventually the patient's daughter arrives, she corroborates the above story.   Past Medical History:  Diagnosis Date  . Abnormality of gait   . Anemia, unspecified   . Anticoagulated- Eliquis 07/31/2015  . Anxiety state, unspecified   . Aortic stenosis    a. Mild by echo 08/2013, not seen on repeat echo 02/2017.  Marland Kitchen Bifascicular block    afib  . Carotid artery disease (HCC)    a. Carotid dopplers (1/69) with 45-03% LICA stenosis (of note, prior ABI normal).  . Chronic diastolic CHF (congestive heart failure) ([place])    a. Dx 07/2015 - EF 35-40%, unclear etiology ? r/t afib or viral etiology. b. EF normal in 02/2017.  . CKD (chronic kidney disease), stage III   . Depressive disorder, not elsewhere classified   . DM (diabetes mellitus), type 2, uncontrolled, with renal complications ([place]) 8/88/2800  . Eczema 11/22/2013  . Esophageal dysmotility 08/05/2015  . Essential hypertension   . Female stress incontinence 05/06/2015  . Full dentures   . GERD (gastroesophageal reflux disease)   . Gout   . HOH (hard of hearing)   . Internal hemorrhoids without mention of complication   .  Iron deficiency   . Lumbago   . Memory loss   . Mild CAD    a. Cath 2013: 20-30% prox RCA.  Marland Kitchen Myocardial infarction ([place]) 07/2015  . Nonspecific abnormal results of liver function study   . Obesity, unspecified   . Orthostatic hypotension   . Osteoarthritis   . Other and unspecified hyperlipidemia   . PAF (paroxysmal atrial fibrillation) ([place]) 07/11/2015   a. prior DCCVs, amiodarone, anticoagulation.  . Pain in joint, shoulder region 11/22/2013   left   . Personal history of fall   . Presence of permanent cardiac pacemaker   . Pruritus 11/22/2013  . S/P cardiac pacemaker procedure 01/11/2012   a. for symptomatic bradycardia. Followed by Dr. Lovena Le. Device changed out 2017 due to ppm infection.  . Stroke [facility])    a. evidence of prior strokes on MRI in 2018.  Marland Kitchen UTI (urinary tract infection) 04/2017  . Wears glasses     Patient Active Problem List   Diagnosis Date Noted  . Upper respiratory tract infection 07/07/2017  . Confusion 06/03/2017  . History of CVA (cerebrovascular accident)   . Hyponatremia   . Stage 3 chronic kidney disease   . AKI (acute kidney injury) ([place])   . Acute blood loss anemia   . UTI (urinary tract infection) 04/28/2017  . Acute metabolic encephalopathy 34/91/7915  . Acute lower UTI 04/28/2017  . Diabetes mellitus with diabetic nephropathy ([place]) 04/28/2017  . Dyslipidemia associated with  type 2 diabetes mellitus ([place]) 04/28/2017  . Chronic diastolic CHF (congestive heart failure) ([place]) 04/28/2017  . Altered mental status   . TIA (transient ischemic attack) 03/31/2017  . Thrombocytopenia ([place]) 03/31/2017  . Chest pain 02/14/2017  . Dysuria 09/21/2016  . Fall 06/25/2016  . Skin tear of elbow without complication 63/14/9702  . Injury of left shoulder and upper arm 06/24/2016  . Unequal blood pressure in upper extremities 06/03/2016  . Cardiac pacemaker in situ 04/12/2016  . Cough 03/08/2016  . Risk for falls 03/05/2016  . Insomnia 01/21/2016  .  Chronic systolic congestive heart failure, NYHA class 3 ([place]) 01/20/2016  . LFT elevation 11/21/2015  . Weakness 11/06/2015  . Dysphagia   . Esophageal dysmotility 08/05/2015  . Anticoagulated- Eliquis 07/31/2015  . Normochromic normocytic anemia 07/31/2015  . Atrial fibrillation - 07/11/15 by PPM interrogation, recurrent 10/19 after DCCV 10/14 07/18/2015  . Moderate mitral regurgitation 07/18/2015  . Moderate tricuspid regurgitation 07/18/2015  . History of pacemaker - Medtronic 07/18/2015  . CKD (chronic kidney disease), stage III   . Orthostatic hypotension   . Essential hypertension   . Pericardial effusion 07/15/2015  . Female stress incontinence 05/06/2015  . Knee pain 12/18/2014  . Low back pain 02/27/2014  . Obese 02/27/2014  . Depression 02/27/2014  . Pain in joint, shoulder region 11/22/2013  . Eczema 11/22/2013  . S/P cardiac pacemaker procedure, PPM Medtronic REVO placed 01/11/2012 01/11/2012  . COPD bronchitis by CXR 01/10/2012  . Dyslipidemia 01/10/2012  . Mild CAD - 20-30% RCA in 2013 01/10/2012  . Fatigue 01/10/2012  . Sinus bradycardia, HR as low As 35 01/10/2012  . Mild aortic stenosis 01/10/2012  . Chest wall pain 01/08/2012  . SOB (shortness of breath) 01/08/2012  . DM, type 2, with renal complications  63/78/5885    Past Surgical History:  Procedure Laterality Date  . CARDIAC CATHETERIZATION  12/03/2003   Dr Rex Kras  . CARDIOVERSION N/A 07/25/2015   Procedure: CARDIOVERSION;  Surgeon: Fay Records, MD;  Location: [facility] ENDOSCOPY;  Service: Cardiovascular;  Laterality: N/A;  . CARDIOVERSION N/A 08/06/2015   Procedure: CARDIOVERSION;  Surgeon: Larey Dresser, MD;  Location: [place];  Service: Cardiovascular;  Laterality: N/A;  . CATARACT EXTRACTION W/ INTRAOCULAR LENS  IMPLANT, BILATERAL Bilateral 03/2015-04/2015  . COLONOSCOPY     ?????  . EP IMPLANTABLE DEVICE N/A 04/15/2016   Procedure: Pacemaker Implant;  Surgeon: Evans Lance, MD;  Location: [place] CV LAB;  Service: Cardiovascular;  Laterality: N/A;  . ESOPHAGOGASTRODUODENOSCOPY N/A 08/08/2015   Procedure: ESOPHAGOGASTRODUODENOSCOPY (EGD);  Surgeon: Gatha Mayer, MD;  Location: [facility] ENDOSCOPY;  Service: Endoscopy;  Laterality: N/A;  . EXTREMITY CYST EXCISION     finger  . EYE SURGERY Bilateral    catartact  . INSERT / REPLACE / REMOVE PACEMAKER    . LEFT HEART CATHETERIZATION WITH CORONARY ANGIOGRAM N/A 01/10/2012   Procedure: LEFT HEART CATHETERIZATION WITH CORONARY ANGIOGRAM;  Surgeon: Pixie Casino, MD;  Location: [facility] CATH LAB;  Service: Cardiovascular;  Laterality: N/A;  . ORIF WRIST FRACTURE Right 04/09/2014   Procedure: OPEN REDUCTION INTERNAL FIXATION (ORIF) RIGHT DISPLACED  DISTAL RADIUS  FRACTURE;  Surgeon: Roseanne Kaufman, MD;  Location: [place];  Service: Orthopedics;  Laterality: Right;  . PACEMAKER LEAD REMOVAL  04/12/2016   TEMPORARY PACEMAKER INSERTION  . PACEMAKER LEAD REMOVAL  04/15/2016   Procedure: Pacemaker Lead Removal;  Surgeon: Evans Lance, MD;  Location: [place] CV LAB;  Service:  Cardiovascular;;  . PACEMAKER LEAD REMOVAL N/A 04/12/2016   Procedure: PACEMAKER LEAD REMOVAL;  Surgeon: Evans Lance, MD;  Location: [place];  Service: Cardiovascular;  Laterality: N/A;  . PERMANENT PACEMAKER INSERTION Left 01/11/2012   Procedure: PERMANENT PACEMAKER INSERTION;  Surgeon: Sanda Klein, MD;  Location: [place] CATH LAB;  Service: Cardiovascular;  Laterality: Left;  . TEE WITHOUT CARDIOVERSION N/A 07/25/2015   Procedure: TRANSESOPHAGEAL ECHOCARDIOGRAM (TEE);  Surgeon: Fay Records, MD;  Location: [facility] ENDOSCOPY;  Service: Cardiovascular;  Laterality: N/A;    OB History    No data available       Home Medications    Prior to Admission medications   Medication Sig Start Date End Date Taking? Authorizing Provider  albuterol (PROVENTIL HFA;VENTOLIN HFA) 108 (90 Base) MCG/ACT inhaler Inhale 2 puffs into the lungs every 4 (four) hours as needed for  wheezing or shortness of breath (cough, shortness of breath or wheezing.). 10/15/16  Yes Elby Beck, FNP  allopurinol (ZYLOPRIM) 300 MG tablet TAKE '300MG'$  BY MOUTH DAILY 05/04/17  Yes Tonia Ghent, MD  amiodarone (PACERONE) 200 MG tablet TAKE '200MG'$  BY MOUTH DAILY 05/04/17  Yes Tonia Ghent, MD  apixaban (ELIQUIS) 2.5 MG TABS tablet TAKE 2.'5MG'$  BY MOUTH TWICE A DAY 05/04/17  Yes Tonia Ghent, MD  atorvastatin (LIPITOR) 20 MG tablet Take 1 tablet (20 mg total) by mouth daily at 6 PM. 04/06/17  Yes Velvet Bathe, MD  Blood Glucose Monitoring Suppl (ONE TOUCH ULTRA 2) w/Device KIT Check blood sugar twice a day and as directed Dx E11.21 05/24/17  Yes Tonia Ghent, MD  buPROPion (WELLBUTRIN) 75 MG tablet Take 1 tablet (75 mg total) by mouth 2 (two) times daily. 07/01/17  Yes Tonia Ghent, MD  fluticasone [facility]) 50 MCG/ACT nasal spray Place 1 spray into both nostrils daily as needed for allergies or rhinitis.   Yes [provider]  GAS RELIEF 80 MG chewable tablet CHEW ONE TABLET BY MOUTH EVERY 6 HOURS AS NEEDED FOR GAS. 11/06/16  Yes Tonia Ghent, MD  glimepiride (AMARYL) 1 MG tablet TAKE '1MG'$  BY MOUTH EVERY MORNING WITH BREAKFAST 05/04/17  Yes Tonia Ghent, MD  glucose blood (ONE TOUCH ULTRA TEST) test strip CHECK BLOOD SUGAR TWICE A DAY AND AS DIRECTED;DX E11.21 05/24/17  Yes Tonia Ghent, MD  Insulin Glargine (LANTUS SOLOSTAR) 100 UNIT/ML Solostar Pen Inject 16 Units into the skin See admin instructions. Daily at 10pm. If blood sugar 100-150 give same units as last dose, if greater than 151 increase by 1 unit. 06/09/17  Yes Tonia Ghent, MD  Insulin Pen Needle (PEN NEEDLES) 30G X 5 MM MISC Inject 1 Dose as directed daily. Use with insulin pen 01/27/17  Yes Tonia Ghent, MD  loratadine (CLARITIN CHILDRENS) 5 MG chewable tablet Chew 1 tablet (5 mg total) by mouth daily. 01/26/17  Yes Tonia Ghent, MD  metoprolol succinate (TOPROL-XL) 25 MG 24 hr tablet Take 1  tablet (25 mg total) by mouth 2 (two) times daily. 04/05/17  Yes Velvet Bathe, MD  Multiple Vitamins-Minerals (ONE-A-DAY 50 PLUS PO) Take 1 capsule by mouth daily.   Yes [provider]  pantoprazole (PROTONIX) 40 MG tablet TAKE '40MG'$  BY MOUTH DAILY 05/04/17  Yes Tonia Ghent, MD  potassium chloride SA (KLOR-CON M20) 20 MEQ tablet Take 2 tablets (40 mEq total) by mouth daily. 06/15/17 09/13/17 Yes Larey Dresser, MD  senna-docusate (SENOKOT-S) 8.6-50 MG tablet Take 1 tablet by mouth  daily as needed for mild constipation. 07/28/15  Yes Barrett, Evelene Croon, PA-C  torsemide (DEMADEX) 20 MG tablet Take 4 tablets (80 mg total) by mouth daily. 06/14/17  Yes Larey Dresser, MD  traZODone (DESYREL) 50 MG tablet Take 0.5-1 tablets (25-50 mg total) by mouth at bedtime as needed for sleep. Don't take >'50mg'$  in a day. 01/21/16  Yes Tonia Ghent, MD    Family History Family History  Problem Relation Age of Onset  . Breast cancer Mother   . Cancer Mother   . Diabetes Daughter   . Hypertension Daughter   . Cancer Brother     Social History Social History  Substance Use Topics  . Smoking status: Never Smoker  . Smokeless tobacco: Never Used  . Alcohol use No     Allergies   Beta adrenergic blockers; Jardiance [empagliflozin]; Lexapro [escitalopram oxalate]; Spironolactone; and Zoloft [sertraline hcl]   Review of Systems Review of Systems  Constitutional:       Per HPI, otherwise negative  HENT:       Per HPI, otherwise negative  Respiratory:       Per HPI, otherwise negative  Cardiovascular:       Per HPI, otherwise negative  Gastrointestinal: Negative for vomiting.  Endocrine:       Negative aside from HPI  Genitourinary:       Neg aside from HPI   Musculoskeletal:       Per HPI, otherwise negative  Skin: Negative.   Neurological: Positive for weakness. Negative for syncope.     Physical Exam Updated Vital Signs BP 121/67 (BP Location: Right Arm)   Pulse 76   Temp  98.5 F (36.9 C) (Oral)   Resp 20   SpO2 100%   Physical Exam  Constitutional: She is oriented to person, place, and time. She appears well-developed and well-nourished. She has a sickly appearance.  Obese elderly female smiling, edentulous interactive.  HENT:  Head: Normocephalic and atraumatic.  Eyes: Conjunctivae and EOM are normal.  Cardiovascular: Normal rate and regular rhythm.   Pulmonary/Chest: Effort normal and breath sounds normal. No stridor. No respiratory distress.  Abdominal: She exhibits no distension.  Musculoskeletal: She exhibits no edema.       Legs: Neurological: She is alert and oriented to person, place, and time. She displays atrophy. She displays no tremor. No cranial nerve deficit. She displays no seizure activity.  Skin: Skin is warm and dry.  Psychiatric: She is slowed.  Nursing note and vitals reviewed.    ED Treatments / Results  Labs (all labs ordered are listed, but only abnormal results are displayed) Labs Reviewed  COMPREHENSIVE METABOLIC PANEL - Abnormal; Notable for the following:       Result Value   Chloride 97 (*)    Glucose, Bld 203 (*)    BUN 32 (*)    Creatinine, Ser 2.58 (*)    Albumin 2.5 (*)    AST 70 (*)    Alkaline Phosphatase 132 (*)    Total Bilirubin 1.7 (*)    GFR calc non Af Amer 16 (*)    GFR calc Af Amer 19 (*)    All other components within normal limits  CBC WITH DIFFERENTIAL/PLATELET - Abnormal; Notable for the following:    RBC 3.64 (*)    Hemoglobin 10.8 (*)    HCT 33.8 (*)    RDW 19.7 (*)    Platelets 101 (*)    All other components within normal limits  URINALYSIS, ROUTINE W REFLEX MICROSCOPIC - Abnormal; Notable for the following:    APPearance HAZY (*)    Leukocytes, UA LARGE (*)    Bacteria, UA MANY (*)    Squamous Epithelial / LPF 0-5 (*)    Non Squamous Epithelial 0-5 (*)    All other components within normal limits     Radiology Dg Chest 2 View  Result Date: 07/07/2017 CLINICAL DATA:  [place]  today with shortness of breath, left ankle pain and swelling EXAM: CHEST  2 VIEW COMPARISON:  Chest x-ray of 06/03/2017 FINDINGS: There is opacity posteriorly at the lung base on the lateral view probably representing pleural effusion rolling posteriorly, possibly on the left. The lungs are not well aerated. Minimal opacity is also noted in the right mid upper lung field. This could represent atelectasis, but developing pneumonia cannot be excluded. The left lung otherwise is clear. Heart size is stable with mild cardiomegaly present. Permanent pacemaker remains. There are degenerative changes throughout the thoracic spine. IMPRESSION: 1. Opacity posteriorly most consistent with pleural effusion possibly on the left. 2. Opacity in the right mid lung may represent atelectasis, but pneumonia cannot be excluded. Recommend followup. Electronically Signed   By: Ivar Drape M.D.   On: 07/07/2017 10:42   Dg Ankle Complete Left  Result Date: 07/07/2017 CLINICAL DATA:  Fall today with left ankle pain and swelling EXAM: LEFT ANKLE COMPLETE - 3+ VIEW COMPARISON:  02/23/2017 left ankle radiographs FINDINGS: No left ankle fracture or subluxation. No suspicious focal osseous lesion. Minimal diffuse left ankle soft tissue swelling. No radiopaque foreign body. Moderate degenerative changes in the tarsal joints. IMPRESSION: Minimal diffuse left ankle soft tissue swelling, with no fracture or subluxation. Electronically Signed   By: Ilona Sorrel M.D.   On: 07/07/2017 10:40   Dg Knee Complete 4 Views Right  Result Date: 07/07/2017 CLINICAL DATA:  Right knee pain after fall today. EXAM: RIGHT KNEE - COMPLETE 4+ VIEW COMPARISON:  Radiographs of March 27, 2010. FINDINGS: No evidence of fracture, dislocation, or joint effusion. Severe narrowing of medial joint space is noted with osteophyte formation. Chondrocalcinosis is noted laterally. Vascular calcifications are noted. IMPRESSION: Severe degenerative joint disease is noted  medially. No acute abnormality seen in the right knee. Electronically Signed   By: Marijo Conception, M.D.   On: 07/07/2017 10:51    Procedures Procedures (including critical care time)  Medications Ordered in ED Medications  0.9 %  sodium chloride infusion (not administered)  cefTRIAXone (ROCEPHIN) 1 g in dextrose 5 % 50 mL IVPB (not administered)  sodium chloride 0.9 % bolus 500 mL (500 mLs Intravenous New Bag/Given 07/07/17 0955)     Initial Impression / Assessment and Plan / ED Course  I have reviewed the triage vital signs and the nursing notes.  Pertinent labs & imaging results that were available during my care of the patient were reviewed by me and considered in my medical decision making (see chart for details).     11:19 AM After reviewing all x-rays, with reassuring findings, labs concerning for possible urinl as worseni renal function, discussed all findings with the patient and her daughter. Specifically we discussed concern for patient's decreased renal function, need for resuscitation, but with consideration of her congestive heart failure. We discussed risks and benefits of admission, given the worsening renal function, congestive heart failure, urinary tract infection and altered mental status, the patient was admitted after initiation of antibiotics, gentle fluid resuscitation.   Final Clinical Impressions(s) /  ED Diagnoses  Altered mental status Urinary tract infection Dehydration Fall, initial encounter   Carmin Muskrat, MD 07/07/17 1120

## 2017-07-07 NOTE — ED Notes (Signed)
Attempted report x1. 

## 2017-07-07 NOTE — ED Notes (Signed)
Attempted report x 2 

## 2017-07-07 NOTE — H&P (Addendum)
History and Physical    FREDDYE CARDAMONE YPP:[id number] DOB: 1932/06/15 DOA: 07/07/2017   PCP: Tonia Ghent, MD   Patient coming from:  Home    Chief Complaint: Confusion  HPI: Danielle Benitez is a 83 y.o. female with extensive medical history listed below, including CHF, CKD 3, PAF on Eliquis brought to the ED via EMS due to concern of increased confusion and fall without hitting her head. but did so to her knees. In addition, she had been less interactive, less verbal, decreased by mouth intake, nausea and vomiting. No apparent unilateral weakness or paresthesias. No dysarthria  Symptoms were preceded by an upper respiratory infection last Friday, treated supportively. No apparent fever or chills or thick sputum production or hemoptysis was reported. No apparent abdominal pain. She has had a couple of episodes of loose stools, but no frank diarrhea or hematochezia. No episodes here to the ER. On presentation, the patient was very confused.  At the ER, and the fluid was given, total of 1500 mL, at which time her confusion began to improve, and she was more verbal and overall status has improved   ED Course:  BP (!) 154/77   Pulse 70   Temp 98.5 F (36.9 C) (Oral)   Resp 20   SpO2 96%    Sodium 135 potassium 4.1 bicarb 26 glucose 203 creatinine 2.58 calcium 8.9 WBC count 9 hemoglobin 10.8 platelets 101 Urine with large leukocytes present Chest x-ray with possible infiltrate versus atelectasis on the right middle lobe, known  pleural effusion. Lactic acid is pending.  Review of Systems:  As per HPI otherwise all other systems reviewed and are negative  Past Medical History:  Diagnosis Date  . Abnormality of gait   . Anemia, unspecified   . Anticoagulated- Eliquis 07/31/2015  . Anxiety state, unspecified   . Aortic stenosis    a. Mild by echo 08/2013, not seen on repeat echo 02/2017.  Marland Kitchen Bifascicular block    afib  . Carotid artery disease (HCC)    a. Carotid dopplers (4/58) with  09-98% LICA stenosis (of note, prior ABI normal).  . Chronic diastolic CHF (congestive heart failure) ([place])    a. Dx 07/2015 - EF 35-40%, unclear etiology ? r/t afib or viral etiology. b. EF normal in 02/2017.  . CKD (chronic kidney disease), stage III   . Depressive disorder, not elsewhere classified   . DM (diabetes mellitus), type 2, uncontrolled, with renal complications ([place]) 3/38/2505  . Eczema 11/22/2013  . Esophageal dysmotility 08/05/2015  . Essential hypertension   . Female stress incontinence 05/06/2015  . Full dentures   . GERD (gastroesophageal reflux disease)   . Gout   . HOH (hard of hearing)   . Internal hemorrhoids without mention of complication   . Iron deficiency   . Lumbago   . Memory loss   . Mild CAD    a. Cath 2013: 20-30% prox RCA.  Marland Kitchen Myocardial infarction ([place]) 07/2015  . Nonspecific abnormal results of liver function study   . Obesity, unspecified   . Orthostatic hypotension   . Osteoarthritis   . Other and unspecified hyperlipidemia   . PAF (paroxysmal atrial fibrillation) ([place]) 07/11/2015   a. prior DCCVs, amiodarone, anticoagulation.  . Pain in joint, shoulder region 11/22/2013   left   . Personal history of fall   . Presence of permanent cardiac pacemaker   . Pruritus 11/22/2013  . S/P cardiac pacemaker procedure 01/11/2012   a. for symptomatic bradycardia.  Followed by Dr. Lovena Le. Device changed out 2017 due to ppm infection.  . Stroke [facility])    a. evidence of prior strokes on MRI in 2018.  Marland Kitchen UTI (urinary tract infection) 04/2017  . Wears glasses     Past Surgical History:  Procedure Laterality Date  . CARDIAC CATHETERIZATION  12/03/2003   Dr Rex Kras  . CARDIOVERSION N/A 07/25/2015   Procedure: CARDIOVERSION;  Surgeon: Fay Records, MD;  Location: [facility] ENDOSCOPY;  Service: Cardiovascular;  Laterality: N/A;  . CARDIOVERSION N/A 08/06/2015   Procedure: CARDIOVERSION;  Surgeon: Larey Dresser, MD;  Location: [place];  Service: Cardiovascular;   Laterality: N/A;  . CATARACT EXTRACTION W/ INTRAOCULAR LENS  IMPLANT, BILATERAL Bilateral 03/2015-04/2015  . COLONOSCOPY     ?????  . EP IMPLANTABLE DEVICE N/A 04/15/2016   Procedure: Pacemaker Implant;  Surgeon: Evans Lance, MD;  Location: [place] CV LAB;  Service: Cardiovascular;  Laterality: N/A;  . ESOPHAGOGASTRODUODENOSCOPY N/A 08/08/2015   Procedure: ESOPHAGOGASTRODUODENOSCOPY (EGD);  Surgeon: Gatha Mayer, MD;  Location: [facility] ENDOSCOPY;  Service: Endoscopy;  Laterality: N/A;  . EXTREMITY CYST EXCISION     finger  . EYE SURGERY Bilateral    catartact  . INSERT / REPLACE / REMOVE PACEMAKER    . LEFT HEART CATHETERIZATION WITH CORONARY ANGIOGRAM N/A 01/10/2012   Procedure: LEFT HEART CATHETERIZATION WITH CORONARY ANGIOGRAM;  Surgeon: Pixie Casino, MD;  Location[facility] CATH LAB;  Service: Cardiovascular;  Laterality: N/A;  . ORIF WRIST FRACTURE Right 04/09/2014   Procedure: OPEN REDUCTION INTERNAL FIXATION (ORIF) RIGHT DISPLACED  DISTAL RADIUS  FRACTURE;  Surgeon: Roseanne Kaufman, MD;  Location: [place];  Service: Orthopedics;  Laterality: Right;  . PACEMAKER LEAD REMOVAL  04/12/2016   TEMPORARY PACEMAKER INSERTION  . PACEMAKER LEAD REMOVAL  04/15/2016   Procedure: Pacemaker Lead Removal;  Surgeon: Evans Lance, MD;  Location: [place] CV LAB;  Service: Cardiovascular;;  . PACEMAKER LEAD REMOVAL N/A 04/12/2016   Procedure: PACEMAKER LEAD REMOVAL;  Surgeon: Evans Lance, MD;  Location: [place];  Service: Cardiovascular;  Laterality: N/A;  . PERMANENT PACEMAKER INSERTION Left 01/11/2012   Procedure: PERMANENT PACEMAKER INSERTION;  Surgeon: Sanda Klein, MD;  Location: [place] CATH LAB;  Service: Cardiovascular;  Laterality: Left;  . TEE WITHOUT CARDIOVERSION N/A 07/25/2015   Procedure: TRANSESOPHAGEAL ECHOCARDIOGRAM (TEE);  Surgeon: Fay Records, MD;  Location: [facility] ENDOSCOPY;  Service: Cardiovascular;  Laterality: N/A;    Social History Social History   Social History  .  Marital status: Widowed    Spouse name: N/A  . Number of children: N/A  . Years of education: N/A   Occupational History  . Not on file.   Social History Main Topics  . Smoking status: Never Smoker  . Smokeless tobacco: Never Used  . Alcohol use No  . Drug use: No  . Sexual activity: No   Other Topics Concern  . Not on file   Social History Narrative   Physically inactive. She says she hurts in her back and knees too much to do much walking.   9th grade   Worked in multiple mills   Living with daughter    Doesn't drive as of 86/7619     Allergies  Allergen Reactions  . Beta Adrenergic Blockers Other (See Comments)    Couldn't speak or hear Metoprolol seems ok  . Jardiance [Empagliflozin] Other (See Comments)    Rash, kidney issues  . Lexapro [Escitalopram Oxalate] Other (See Comments)  Lack of effect.   Marland Kitchen Spironolactone Other (See Comments)    Hyperkalemia  . Zoloft [Sertraline Hcl] Other (See Comments)    Lack of effect.     Family History  Problem Relation Age of Onset  . Breast cancer Mother   . Cancer Mother   . Diabetes Daughter   . Hypertension Daughter   . Cancer Brother       Prior to Admission medications   Medication Sig Start Date End Date Taking? Authorizing Provider  albuterol (PROVENTIL HFA;VENTOLIN HFA) 108 (90 Base) MCG/ACT inhaler Inhale 2 puffs into the lungs every 4 (four) hours as needed for wheezing or shortness of breath (cough, shortness of breath or wheezing.). 10/15/16  Yes Elby Beck, FNP  allopurinol (ZYLOPRIM) 300 MG tablet TAKE '300MG'$  BY MOUTH DAILY 05/04/17  Yes Tonia Ghent, MD  amiodarone (PACERONE) 200 MG tablet TAKE '200MG'$  BY MOUTH DAILY 05/04/17  Yes Tonia Ghent, MD  apixaban (ELIQUIS) 2.5 MG TABS tablet TAKE 2.'5MG'$  BY MOUTH TWICE A DAY 05/04/17  Yes Tonia Ghent, MD  atorvastatin (LIPITOR) 20 MG tablet Take 1 tablet (20 mg total) by mouth daily at 6 PM. 04/06/17  Yes Velvet Bathe, MD  Blood Glucose  Monitoring Suppl (ONE TOUCH ULTRA 2) w/Device KIT Check blood sugar twice a day and as directed Dx E11.21 05/24/17  Yes Tonia Ghent, MD  buPROPion (WELLBUTRIN) 75 MG tablet Take 1 tablet (75 mg total) by mouth 2 (two) times daily. 07/01/17  Yes Tonia Ghent, MD  fluticasone [employer]) 50 MCG/ACT nasal spray Place 1 spray into both nostrils daily as needed for allergies or rhinitis.   Yes [provider]  GAS RELIEF 80 MG chewable tablet CHEW ONE TABLET BY MOUTH EVERY 6 HOURS AS NEEDED FOR GAS. 11/06/16  Yes Tonia Ghent, MD  glimepiride (AMARYL) 1 MG tablet TAKE '1MG'$  BY MOUTH EVERY MORNING WITH BREAKFAST 05/04/17  Yes Tonia Ghent, MD  glucose blood (ONE TOUCH ULTRA TEST) test strip CHECK BLOOD SUGAR TWICE A DAY AND AS DIRECTED;DX E11.21 05/24/17  Yes Tonia Ghent, MD  Insulin Glargine (LANTUS SOLOSTAR) 100 UNIT/ML Solostar Pen Inject 16 Units into the skin See admin instructions. Daily at 10pm. If blood sugar 100-150 give same units as last dose, if greater than 151 increase by 1 unit. 06/09/17  Yes Tonia Ghent, MD  Insulin Pen Needle (PEN NEEDLES) 30G X 5 MM MISC Inject 1 Dose as directed daily. Use with insulin pen 01/27/17  Yes Tonia Ghent, MD  loratadine (CLARITIN CHILDRENS) 5 MG chewable tablet Chew 1 tablet (5 mg total) by mouth daily. 01/26/17  Yes Tonia Ghent, MD  metoprolol succinate (TOPROL-XL) 25 MG 24 hr tablet Take 1 tablet (25 mg total) by mouth 2 (two) times daily. 04/05/17  Yes Velvet Bathe, MD  Multiple Vitamins-Minerals (ONE-A-DAY 50 PLUS PO) Take 1 capsule by mouth daily.   Yes [provider]  pantoprazole (PROTONIX) 40 MG tablet TAKE '40MG'$  BY MOUTH DAILY 05/04/17  Yes Tonia Ghent, MD  potassium chloride SA (KLOR-CON M20) 20 MEQ tablet Take 2 tablets (40 mEq total) by mouth daily. 06/15/17 09/13/17 Yes Larey Dresser, MD  senna-docusate (SENOKOT-S) 8.6-50 MG tablet Take 1 tablet by mouth daily as needed for mild constipation. 07/28/15   Yes Barrett, Evelene Croon, PA-C  torsemide (DEMADEX) 20 MG tablet Take 4 tablets (80 mg total) by mouth daily. 06/14/17  Yes Larey Dresser, MD  traZODone (DESYREL) 50  MG tablet Take 0.5-1 tablets (25-50 mg total) by mouth at bedtime as needed for sleep. Don't take >'50mg'$  in a day. 01/21/16  Yes Tonia Ghent, MD    Physical Exam:  Vitals:   07/07/17 0910 07/07/17 1118  BP: 121/67 (!) 154/77  Pulse: 76 70  Resp: 20   Temp: 98.5 F (36.9 C)   TempSrc: Oral   SpO2: 100% 96%   Constitutional: NAD, calm, ill appearing.  Eyes: PERRL, lids and conjunctivae normal ENMT: Mucous membranes are dry , without exudate or lesions  Neck: normal, supple, no masses, no thyromegaly Respiratory: clear to auscultation bilaterally, no wheezing, no crackles. Normal respiratory effort  Cardiovascular: Regular rate and rhythm,  1/6 murmur, rubs or gallops. No extremity edema. 2+ pedal pulses. No carotid bruits.  Abdomen:  Obese Soft, no apparent tenderness  No hepatosplenomegaly. Bowel sounds positive.  Musculoskeletal: no clubbing / cyanosis. Moves all extremities. Some bruising at the right knee and left foot.  Skin: no jaundice, No lesions.  Neurologic: Sensation intact  Strength equal in all extremities. Aware of place, person, cannot recall year. Follows commands appropriately. Normal mood     Labs on Admission: I have personally reviewed following labs and imaging studies  CBC:  Recent Labs Lab 07/04/17 1118 07/07/17 0913  WBC 8.8 9.0  NEUTROABS 5.5 6.1  HGB 10.5* 10.8*  HCT 33.9* 33.8*  MCV 96.3 92.9  PLT 113.0* 101*    Basic Metabolic Panel:  Recent Labs Lab 07/04/17 1118 07/07/17 0913  NA 139 135  K 4.6 4.1  CL 101 97*  CO2 32 26  GLUCOSE 302* 203*  BUN 33* 32*  CREATININE 2.33* 2.58*  CALCIUM 9.8 8.9    GFR: CrCl cannot be calculated (Unknown ideal weight.).  Liver Function Tests:  Recent Labs Lab 07/07/17 0913  AST 70*  ALT 30  ALKPHOS 132*  BILITOT 1.7*    PROT 7.5  ALBUMIN 2.5*   No results for input(s): LIPASE, AMYLASE in the last 168 hours. No results for input(s): AMMONIA in the last 168 hours.  Coagulation Profile: No results for input(s): INR, PROTIME in the last 168 hours.  Cardiac Enzymes: No results for input(s): CKTOTAL, CKMB, CKMBINDEX, TROPONINI in the last 168 hours.  BNP (last 3 results)  Recent Labs  09/20/16 1458 03/16/17 1441 04/11/17 1345  PROBNP 140.0* 287.0* 215.0*    HbA1C: No results for input(s): HGBA1C in the last 72 hours.  CBG: No results for input(s): GLUCAP in the last 168 hours.  Lipid Profile: No results for input(s): CHOL, HDL, LDLCALC, TRIG, CHOLHDL, LDLDIRECT in the last 72 hours.  Thyroid Function Tests: No results for input(s): TSH, T4TOTAL, FREET4, T3FREE, THYROIDAB in the last 72 hours.  Anemia Panel: No results for input(s): VITAMINB12, FOLATE, FERRITIN, TIBC, IRON, RETICCTPCT in the last 72 hours.  Urine analysis:    Component Value Date/Time   COLORURINE YELLOW 07/07/2017 0945   APPEARANCEUR HAZY (A) 07/07/2017 0945   LABSPEC 1.011 07/07/2017 0945   PHURINE 6.0 07/07/2017 0945   GLUCOSEU NEGATIVE 07/07/2017 0945   HGBUR NEGATIVE 07/07/2017 0945   BILIRUBINUR NEGATIVE 07/07/2017 0945   BILIRUBINUR 1+ 09/20/2016 1424   KETONESUR NEGATIVE 07/07/2017 0945   PROTEINUR NEGATIVE 07/07/2017 0945   UROBILINOGEN 0.2 09/20/2016 1424   UROBILINOGEN 0.2 08/02/2015 0330   NITRITE NEGATIVE 07/07/2017 0945   LEUKOCYTESUR LARGE (A) 07/07/2017 0945    Sepsis Labs: '@LABRCNTIP'$ (procalcitonin:4,lacticidven:4) )No results found for this or any previous visit (from the past 240 hour(s)).  Radiological Exams on Admission: Dg Chest 2 View  Result Date: 07/07/2017 CLINICAL DATA:  [place] today with shortness of breath, left ankle pain and swelling EXAM: CHEST  2 VIEW COMPARISON:  Chest x-ray of 06/03/2017 FINDINGS: There is opacity posteriorly at the lung base on the lateral view probably  representing pleural effusion rolling posteriorly, possibly on the left. The lungs are not well aerated. Minimal opacity is also noted in the right mid upper lung field. This could represent atelectasis, but developing pneumonia cannot be excluded. The left lung otherwise is clear. Heart size is stable with mild cardiomegaly present. Permanent pacemaker remains. There are degenerative changes throughout the thoracic spine. IMPRESSION: 1. Opacity posteriorly most consistent with pleural effusion possibly on the left. 2. Opacity in the right mid lung may represent atelectasis, but pneumonia cannot be excluded. Recommend followup. Electronically Signed   By: Ivar Drape M.D.   On: 07/07/2017 10:42   Dg Ankle Complete Left  Result Date: 07/07/2017 CLINICAL DATA:  Fall today with left ankle pain and swelling EXAM: LEFT ANKLE COMPLETE - 3+ VIEW COMPARISON:  02/23/2017 left ankle radiographs FINDINGS: No left ankle fracture or subluxation. No suspicious focal osseous lesion. Minimal diffuse left ankle soft tissue swelling. No radiopaque foreign body. Moderate degenerative changes in the tarsal joints. IMPRESSION: Minimal diffuse left ankle soft tissue swelling, with no fracture or subluxation. Electronically Signed   By: Ilona Sorrel M.D.   On: 07/07/2017 10:40   Dg Knee Complete 4 Views Right  Result Date: 07/07/2017 CLINICAL DATA:  Right knee pain after fall today. EXAM: RIGHT KNEE - COMPLETE 4+ VIEW COMPARISON:  Radiographs of March 27, 2010. FINDINGS: No evidence of fracture, dislocation, or joint effusion. Severe narrowing of medial joint space is noted with osteophyte formation. Chondrocalcinosis is noted laterally. Vascular calcifications are noted. IMPRESSION: Severe degenerative joint disease is noted medially. No acute abnormality seen in the right knee. Electronically Signed   By: Marijo Conception, M.D.   On: 07/07/2017 10:51    EKG: Independently reviewed.  Assessment/Plan Active Problems:   Acute  lower UTI   Confusion   DM, type 2, with renal complications    Dyslipidemia   Mild CAD - 20-30% RCA in 2013   Fatigue   Mild aortic stenosis   S/P cardiac pacemaker procedure, PPM Medtronic REVO placed 01/11/2012   Depression   History of pacemaker - Medtronic   Anticoagulated- Eliquis   Normochromic normocytic anemia   Chronic systolic congestive heart failure, NYHA class 3 (HCC)   Fall   Thrombocytopenia ([place])   Acute metabolic encephalopathy   Diabetes mellitus with diabetic nephropathy (HCC)   Chronic diastolic CHF (congestive heart failure) ([place])   Stage 3 chronic kidney disease   Upper respiratory tract infection    Acute encephalopathy likely secondary to UTI and severe dehydration  Recurrent issue, last episode on 06/03/2017  VSS, Afebrile  UA + Leukocytes in urine. Cultures pending.  WBC 9. Lactic acid pending Symptoms improving after IV hydration, total 1.5 L . Receiving Rocephin IV . Of note, CXR  Suspicious RML ATX versus infiltrate Medsurg obs  IV fluids Hold Demadex today  IV Rocephin  Await cultures   If symptoms do not improve, we will perform CT of the head to rule out any acute intracranial abnormalities. CBC and symmetric in the morning   Recent URI. CXR suspicious for infiltrate RML. Afebrile. WBC normal Continue albuterol inhaler Will monitor the need for adding another antibiotic to the regimen, CXR  in am  CBC in a m     Atrial Fibrillation CHA2DS2-VASc score 7, on anticoagulation with 7 , s/p PMP  Rate controlled Continue Apixaban   Deconditioned with falls risk. Patient fell today, denies hitting her head. She is very weak. Last recent  carotid Doppler without significant stenosis. Last 2-D echo 18/56/3149 grade 1 diastolic dysfunction,  70% to 65%. Normal systolic.  Fall precautions PT/OT  Hypertension BP  154/77   Pulse 70    Controlled Continue home anti-hypertensive medications  Check orthostatics in view of her frequent falls due to  above   Type II Diabetes with Neuropathy Current blood sugar level is 203 Lab Results  Component Value Date   HGBA1C 8.3 (H) 04/29/2017  SSI  Continue Lantus at 25 U nightly Hold oral agents today    Hyperlipidemia Continue home statins   Chronic kidney disease stage 4  Cr 2.58, BL 2.3 In the setting of dehydration Lab Results  Component Value Date   CREATININE 2.58 (H) 07/07/2017   CREATININE 2.33 (H) 07/04/2017   CREATININE 2.34 (H) 06/28/2017  IVF Hold diuretics Repeat CMET in am    Thrombocytopenia, chronic  - Platelets are 101,000. Prior platelet count was 113,000. No apparent bleeding. No transfusion is indicated at this time Monitor counts closely Transfuse 1 unit of platelets if count is less or equal than 10,000 or 20,000 if the patient is acutely bleeding No indication to discontinue  anticoagulant this time.   Anemia of chronic disease Hemoglobin on admission 10.8, essentially stable. No acute bleeding issues are noted at this time.  Repeat CBC in am  No transfusion is indicated at this time   Chronic diastolic CHF  Appears compensated, wight 201 lbs Last 2-D echo 26/37/8588 grade 1 diastolic dysfunction,  50% to 65%. Normal systolic. Last BNP 9/4 was 236 Continue meds  monitor I/Os and daily weights PRN 02    Depression Continue home Wellbutrin  GERD, no acute symptoms Continue PPI  Gout, no acute flare Continue Allopurinol    DVT prophylaxis:  Eliquis Code Status:    Full  Family Communication:  Discussed with patient Disposition Plan: Expect patient to be discharged to home after condition improves Consults called:    None  Admission status: Medsurg Obs    WERTMAN,SARA E, PA-C [facility]   07/07/2017, 12:08 PM   I have evaluated the patient, spoken with her and the daughter, reviewed the case with Sharene Butters and reviewed the chart.   Admitted and discharged on Aug 25.Treated for UTI with acute encephalopathy. Admitted in  7/18 prior to that for same issues. Admitted for weakness in 6/18. Admitted for dysphagia in 5/18 and found to have dysmotility of the esophagus.   She has been weak with very poor oral intake for about 5-6 days now. She was walking to the bathroom with her daughter when she slowly fell to the floor due to weakness. She has been confused and this is common for her when she has infections. She has been battling a cough with brown sputum and has had brown nasal discharge as well but this has not been severe per daughter. She did see her  PCP who suggested her confusion could be related to this URI. RML infiltrate on CXR may or may not be a pneumonia.   She is at times incontinent and does have frequent UTIs. From history it is difficult to tell if this is a UTI or simply pyruia.  I think it is  reasonable to start Rocephin to treat a UTI. Confusion may be due to a URI.  More importantly, she needs hydration, assessment of her weakness and determination if she will eat or will we need to give her IV hydration for now. F/u mental status with treatment.  Of note, per the daughter, her Demedex was started recently by cardiology and Furosemide was discontinued because "her face looked puffy and she had too much fluid on her body". Her weight got down to 185 lb with this change. Now 201 but no fluid overloaded on exam. ECHO show a normal EF and only grade 1 dCHF and I would be careful with diuretics at this time.   Of note, her daughter also states that on Tues, the patient became mentally clear again and remained clear for the whole day but became confused again the following day. ? If medications are playing a part in her confusion. Of if she developed a UTI after this day.  Hold Trazodone which she takes occasionally per daughter.    Debbe Odea, MD

## 2017-07-08 ENCOUNTER — Observation Stay (HOSPITAL_COMMUNITY): Payer: Medicare Other

## 2017-07-08 DIAGNOSIS — D638 Anemia in other chronic diseases classified elsewhere: Secondary | ICD-10-CM | POA: Diagnosis present

## 2017-07-08 DIAGNOSIS — S299XXA Unspecified injury of thorax, initial encounter: Secondary | ICD-10-CM | POA: Diagnosis not present

## 2017-07-08 DIAGNOSIS — G9341 Metabolic encephalopathy: Secondary | ICD-10-CM

## 2017-07-08 DIAGNOSIS — E785 Hyperlipidemia, unspecified: Secondary | ICD-10-CM | POA: Diagnosis present

## 2017-07-08 DIAGNOSIS — D696 Thrombocytopenia, unspecified: Secondary | ICD-10-CM | POA: Diagnosis present

## 2017-07-08 DIAGNOSIS — E860 Dehydration: Secondary | ICD-10-CM | POA: Diagnosis present

## 2017-07-08 DIAGNOSIS — E1121 Type 2 diabetes mellitus with diabetic nephropathy: Secondary | ICD-10-CM

## 2017-07-08 DIAGNOSIS — E1122 Type 2 diabetes mellitus with diabetic chronic kidney disease: Secondary | ICD-10-CM | POA: Diagnosis present

## 2017-07-08 DIAGNOSIS — F329 Major depressive disorder, single episode, unspecified: Secondary | ICD-10-CM

## 2017-07-08 DIAGNOSIS — N390 Urinary tract infection, site not specified: Secondary | ICD-10-CM | POA: Diagnosis not present

## 2017-07-08 DIAGNOSIS — Z7901 Long term (current) use of anticoagulants: Secondary | ICD-10-CM

## 2017-07-08 DIAGNOSIS — E1140 Type 2 diabetes mellitus with diabetic neuropathy, unspecified: Secondary | ICD-10-CM | POA: Diagnosis present

## 2017-07-08 DIAGNOSIS — J189 Pneumonia, unspecified organism: Secondary | ICD-10-CM | POA: Diagnosis present

## 2017-07-08 DIAGNOSIS — I130 Hypertensive heart and chronic kidney disease with heart failure and stage 1 through stage 4 chronic kidney disease, or unspecified chronic kidney disease: Secondary | ICD-10-CM | POA: Diagnosis present

## 2017-07-08 DIAGNOSIS — M1990 Unspecified osteoarthritis, unspecified site: Secondary | ICD-10-CM | POA: Diagnosis present

## 2017-07-08 DIAGNOSIS — Z23 Encounter for immunization: Secondary | ICD-10-CM | POA: Diagnosis not present

## 2017-07-08 DIAGNOSIS — N184 Chronic kidney disease, stage 4 (severe): Secondary | ICD-10-CM | POA: Diagnosis present

## 2017-07-08 DIAGNOSIS — I5032 Chronic diastolic (congestive) heart failure: Secondary | ICD-10-CM

## 2017-07-08 DIAGNOSIS — I252 Old myocardial infarction: Secondary | ICD-10-CM | POA: Diagnosis not present

## 2017-07-08 DIAGNOSIS — R938 Abnormal findings on diagnostic imaging of other specified body structures: Secondary | ICD-10-CM | POA: Diagnosis present

## 2017-07-08 DIAGNOSIS — J13 Pneumonia due to Streptococcus pneumoniae: Secondary | ICD-10-CM

## 2017-07-08 DIAGNOSIS — I480 Paroxysmal atrial fibrillation: Secondary | ICD-10-CM | POA: Diagnosis present

## 2017-07-08 DIAGNOSIS — K219 Gastro-esophageal reflux disease without esophagitis: Secondary | ICD-10-CM | POA: Diagnosis present

## 2017-07-08 DIAGNOSIS — I482 Chronic atrial fibrillation: Secondary | ICD-10-CM | POA: Diagnosis present

## 2017-07-08 DIAGNOSIS — N183 Chronic kidney disease, stage 3 (moderate): Secondary | ICD-10-CM | POA: Diagnosis not present

## 2017-07-08 DIAGNOSIS — H9190 Unspecified hearing loss, unspecified ear: Secondary | ICD-10-CM | POA: Diagnosis present

## 2017-07-08 DIAGNOSIS — I350 Nonrheumatic aortic (valve) stenosis: Secondary | ICD-10-CM | POA: Diagnosis present

## 2017-07-08 DIAGNOSIS — B9689 Other specified bacterial agents as the cause of diseases classified elsewhere: Secondary | ICD-10-CM | POA: Diagnosis present

## 2017-07-08 DIAGNOSIS — I2510 Atherosclerotic heart disease of native coronary artery without angina pectoris: Secondary | ICD-10-CM | POA: Diagnosis present

## 2017-07-08 LAB — GLUCOSE, CAPILLARY
GLUCOSE-CAPILLARY: 144 mg/dL — AB (ref 65–99)
GLUCOSE-CAPILLARY: 113 mg/dL — AB (ref 65–99)
GLUCOSE-CAPILLARY: 217 mg/dL — AB (ref 65–99)
Glucose-Capillary: 263 mg/dL — ABNORMAL HIGH (ref 65–99)
Glucose-Capillary: 370 mg/dL — ABNORMAL HIGH (ref 65–99)

## 2017-07-08 LAB — CBC
HEMATOCRIT: 29.9 % — AB (ref 36.0–46.0)
Hemoglobin: 9.2 g/dL — ABNORMAL LOW (ref 12.0–15.0)
MCH: 28.8 pg (ref 26.0–34.0)
MCHC: 30.8 g/dL (ref 30.0–36.0)
MCV: 93.7 fL (ref 78.0–100.0)
Platelets: 85 10*3/uL — ABNORMAL LOW (ref 150–400)
RBC: 3.19 MIL/uL — AB (ref 3.87–5.11)
RDW: 19.8 % — AB (ref 11.5–15.5)
WBC: 8 10*3/uL (ref 4.0–10.5)

## 2017-07-08 LAB — COMPREHENSIVE METABOLIC PANEL
ALK PHOS: 123 U/L (ref 38–126)
ALT: 26 U/L (ref 14–54)
ANION GAP: 4 — AB (ref 5–15)
AST: 68 U/L — ABNORMAL HIGH (ref 15–41)
Albumin: 2.1 g/dL — ABNORMAL LOW (ref 3.5–5.0)
BILIRUBIN TOTAL: 1.3 mg/dL — AB (ref 0.3–1.2)
BUN: 31 mg/dL — AB (ref 6–20)
CO2: 28 mmol/L (ref 22–32)
Calcium: 8.3 mg/dL — ABNORMAL LOW (ref 8.9–10.3)
Chloride: 104 mmol/L (ref 101–111)
Creatinine, Ser: 2.24 mg/dL — ABNORMAL HIGH (ref 0.44–1.00)
GFR calc non Af Amer: 19 mL/min — ABNORMAL LOW (ref >60)
GFR, EST AFRICAN AMERICAN: 22 mL/min — AB (ref >60)
Glucose, Bld: 143 mg/dL — ABNORMAL HIGH (ref 65–99)
Potassium: 3.7 mmol/L (ref 3.5–5.1)
Sodium: 136 mmol/L (ref 135–145)
Total Protein: 6 g/dL — ABNORMAL LOW (ref 6.5–8.1)

## 2017-07-08 LAB — PROTIME-INR
INR: 1.98
Prothrombin Time: 22.3 seconds — ABNORMAL HIGH (ref 11.4–15.2)

## 2017-07-08 MED ORDER — ALLOPURINOL 100 MG PO TABS
200.0000 mg | ORAL_TABLET | Freq: Every day | ORAL | Status: DC
  Administered 2017-07-09: 200 mg via ORAL
  Filled 2017-07-08: qty 2

## 2017-07-08 MED ORDER — AZITHROMYCIN 250 MG PO TABS
500.0000 mg | ORAL_TABLET | Freq: Every day | ORAL | Status: DC
  Administered 2017-07-08 – 2017-07-09 (×2): 500 mg via ORAL
  Filled 2017-07-08 (×2): qty 2

## 2017-07-08 NOTE — Telephone Encounter (Signed)
Patient went to the ED on 07/07/2017

## 2017-07-08 NOTE — Discharge Instructions (Signed)

## 2017-07-08 NOTE — Plan of Care (Signed)
Problem: Safety: Goal: Ability to remain free from injury will improve Outcome: Progressing Bed in low position, bed alarm on, non skid socks on, call light and belongings in reach.

## 2017-07-08 NOTE — Care Management Note (Addendum)
Case Management Note  Patient Details  Name: MARIJAYNE HOLVECK MRN: [id number] Date of Birth: 19-Feb-1932  Subjective/Objective:                    Action/Plan:  Discussed discharge planning with patient and family at bedside. Patient had HHPT prior to admission through [facility] . Will ask for order and face to face to continue. Patient already has Rolator at home. No other needs identified.  Called [facility] Becky Sax) made them aware patient admitted.   Orders and clinical information faxed to [facility] Expected Discharge Date:                  Expected Discharge Plan:     In-House Referral:     Discharge planning Services  CM Consult  Post Acute Care Choice:  Durable Medical Equipment, Home Health Choice offered to:  Patient, Adult Children  DME Arranged:    DME Agency:     HH Arranged:  PT HH Agency:  [facility]  Status of Service:  Completed, signed off  If discussed at Long Length of Stay Meetings, dates discussed:    Additional Comments:  Kingsley Plan, RN 07/08/2017, 2:36 PM

## 2017-07-08 NOTE — Progress Notes (Signed)
Patient ID: Danielle Benitez, female   DOB: [birth year], 83 y.o.   MRN: [id number]  PROGRESS NOTE    Danielle Benitez  NWG:[id number] DOB: 05/23/1932 DOA: 07/07/2017 PCP: Joaquim Nam, MD   Brief Narrative:  83 year old female with history of CHF, CAD stage III, PAF on Eliquis, UTI, encephalopathy presented on 07/07/2017 with confusion. She was admitted with probable urinary tract infection and started on antibiotics and intravenous fluids.   Assessment & Plan:   Active Problems:   DM, type 2, with renal complications    Dyslipidemia   Mild CAD - 20-30% RCA in 2013   Fatigue   Mild aortic stenosis   S/P cardiac pacemaker procedure, PPM Medtronic REVO placed 01/11/2012   Depression   History of pacemaker - Medtronic   Anticoagulated- Eliquis   Normochromic normocytic anemia   Chronic systolic congestive heart failure, NYHA class 3 (HCC)   Fall   Thrombocytopenia (HCC)   UTI (urinary tract infection)   Acute metabolic encephalopathy   Acute lower UTI   Diabetes mellitus with diabetic nephropathy (HCC)   Chronic diastolic CHF (congestive heart failure) (HCC)   Stage 3 chronic kidney disease   Confusion   Upper respiratory tract infection   Acute metabolic encephalopathy likely secondary to UTI and severe dehydration Improving. Monitor mental status. Fall precautions   Discontinue IV fluids if patient tolerates diet  Hold Demadex today as well If mental status worsens, will get CT head  Probable UTI Continue Rocephin. Follow cultures. Await cultures   Repeat a.m. labs  Recent URI with probable commented acquired pneumonia Continue Rocephin. Add Zithromax. Follow cultures. Obtain urine Legionella and streptococcal antigen. Oxygen supplementation if needed.   Chronic Atrial Fibrillation CHA2DS2-VASc score 7  Rate controlled Continue Apixaban   Deconditioned with falls risk. Last recent  carotid Doppler without significant stenosis. Last 2-D echo  04/29/2017 grade 1  diastolic dysfunction, EF 60% to 65% Fall precautions PT/OT  Hypertension  Controlled Continue home anti-hypertensive medications    Type II Diabetes with Neuropathy  Continue Lantus at 25 U nightly; continue SSI Hold oral agents   Hyperlipidemia Continue home statins   Chronic kidney disease stage 4   Creatinine at baseline. Discontinue IV fluids. May consider restarting diuretics tomorrow probably at a lower dose Repeat a.m. labs   Thrombocytopenia, chronic  Stable. Monitor  Anemia of chronic disease Stable hemoglobin. Monitor  Chronic diastolic CHF  Appears compensated, Last 2-D echo 04/29/2017 grade 1 diastolic dysfunction, EF 60% to 65%  monitor I/Os and daily weights Diuretic plan as above   Depression Continue home Wellbutrin  GERD, no acute symptoms Continue PPI  Gout, no acute flare Decrease allopurinol to 200 mg daily because of renal function     DVT prophylaxis:  Eliquis Code Status:    Full  Family Communication:   none at bedside Disposition Plan: Expect patient to be discharged to home after condition improves in 1-2 days  Consultants: None  Procedures: None  Antimicrobials: Rocephin from 07/07/2017    Subjective: Patient seen and examined at bedside. She feels much better this morning and is more awake. No overnight fever nausea or vomiting  Objective: Vitals:   07/07/17 1631 07/07/17 1956 07/08/17 0600 07/08/17 1441  BP: 134/68 111/84 116/69 121/60  Pulse: 71 69 69 72  Resp: Temp: 97.7 F (36.5 C) 97.9 F (36.6 C) 97.7 F (36.5 C) 98 F (36.7 C)  TempSrc: Oral Oral Oral Oral  SpO2: 98% 98%  96% 100%  Weight: 84.8 kg (187 lb)       Intake/Output Summary (Last 24 hours) at 07/08/17 1509 Last data filed at 07/08/17 1441  Gross per 24 hour  Intake             2470 ml  Output              725 ml  Net             1745 ml   Filed Weights   07/07/17 1631  Weight: 84.8 kg (187 lb)     Examination:  General exam: Appears calm and comfortable  Respiratory system: Bilateral decreased breath sound at basesWith scattered crackles Cardiovascular system: S1 & S2 heard,Rate controlled  Gastrointestinal system: Abdomen is nondistended, soft and nontender. Normal bowel sounds heard. Central nervous system: Awake but seems slightly confused. No focal neurological deficits. Moving extremities Extremities: No cyanosis, clubbing, trace edema   Data Reviewed: I have personally reviewed following labs and imaging studies  CBC:  Recent Labs Lab 07/04/17 1118 07/07/17 0913 07/08/17 0524  WBC 8.8 9.0 8.0  NEUTROABS 5.5 6.1  --   HGB 10.5* 10.8* 9.2*  HCT 33.9* 33.8* 29.9*  MCV 96.3 92.9 93.7  PLT 113.0* 101* 85*   Basic Metabolic Panel:  Recent Labs Lab 07/04/17 1118 07/07/17 0913 07/08/17 0524  NA 139 135 136  K 4.6 4.1 3.7  CL 101 97* 104  CO2 32 26 28  GLUCOSE 302* 203* 143*  BUN 33* 32* 31*  CREATININE 2.33* 2.58* 2.24*  CALCIUM 9.8 8.9 8.3*   GFR: Estimated Creatinine Clearance: 18.8 mL/min (A) (by C-G formula based on SCr of 2.24 mg/dL (H)). Liver Function Tests:  Recent Labs Lab 07/07/17 0913 07/08/17 0524  AST 70* 68*  ALT 30 26  ALKPHOS 132* 123  BILITOT 1.7* 1.3*  PROT 7.5 6.0*  ALBUMIN 2.5* 2.1*   No results for input(s): LIPASE, AMYLASE in the last 168 hours. No results for input(s): AMMONIA in the last 168 hours. Coagulation Profile:  Recent Labs Lab 07/08/17 0524  INR 1.98   Cardiac Enzymes: No results for input(s): CKTOTAL, CKMB, CKMBINDEX, TROPONINI in the last 168 hours. BNP (last 3 results)  Recent Labs  09/20/16 1458 03/16/17 1441 04/11/17 1345  PROBNP 140.0* 287.0* 215.0*   HbA1C: No results for input(s): HGBA1C in the last 72 hours. CBG:  Recent Labs Lab 07/07/17 1719 07/07/17 2329 07/08/17 0614 07/08/17 0759 07/08/17 1227  GLUCAP 109* 202* 144* 113* 217*   Lipid Profile: No results for input(s):  CHOL, HDL, LDLCALC, TRIG, CHOLHDL, LDLDIRECT in the last 72 hours. Thyroid Function Tests: No results for input(s): TSH, T4TOTAL, FREET4, T3FREE, THYROIDAB in the last 72 hours. Anemia Panel: No results for input(s): VITAMINB12, FOLATE, FERRITIN, TIBC, IRON, RETICCTPCT in the last 72 hours. Sepsis Labs:  Recent Labs Lab 07/07/17 1310  LATICACIDVEN 2.3*    Recent Results (from the past 240 hour(s))  Urine culture     Status: Abnormal (Preliminary result)   Collection Time: 07/07/17  9:51 AM  Result Value Ref Range Status   Specimen Description URINE, RANDOM  Final   Special Requests NONE  Final   Culture >=100,000 COLONIES/mL CITROBACTER FREUNDII (A)  Final   Report Status PENDING  Incomplete         Radiology Studies: Dg Chest 2 View  Result Date: 07/08/2017 CLINICAL DATA:  Fall yesterday. Confusion. CHF. Diabetes. Follow-up of abnormal radiograph. EXAM: CHEST  2 VIEW COMPARISON:  07/07/2017  FINDINGS: Both views are degraded by patient size and positioning. Midline trachea. Cardiomegaly accentuated by AP portable technique. Posterior pleural fluid is again identified, likely left-sided. No pneumothorax. Low lung volumes with resultant pulmonary interstitial prominence. Mild left base airspace disease. Degenerative changes of both shoulders. IMPRESSION: Left base airspace disease and adjacent pleural fluid. The airspace disease could represent atelectasis or infection. Decreased sensitivity and specificity exam due to technique related factors, as described above. Cardiomegaly. Electronically Signed   By: Jeronimo Greaves M.D.   On: 07/08/2017 10:11   Dg Chest 2 View  Result Date: 07/07/2017 CLINICAL DATA:  Larey Seat today with shortness of breath, left ankle pain and swelling EXAM: CHEST  2 VIEW COMPARISON:  Chest x-ray of 06/03/2017 FINDINGS: There is opacity posteriorly at the lung base on the lateral view probably representing pleural effusion rolling posteriorly, possibly on the left.  The lungs are not well aerated. Minimal opacity is also noted in the right mid upper lung field. This could represent atelectasis, but developing pneumonia cannot be excluded. The left lung otherwise is clear. Heart size is stable with mild cardiomegaly present. Permanent pacemaker remains. There are degenerative changes throughout the thoracic spine. IMPRESSION: 1. Opacity posteriorly most consistent with pleural effusion possibly on the left. 2. Opacity in the right mid lung may represent atelectasis, but pneumonia cannot be excluded. Recommend followup. Electronically Signed   By: Dwyane Dee M.D.   On: 07/07/2017 10:42   Dg Ankle Complete Left  Result Date: 07/07/2017 CLINICAL DATA:  Fall today with left ankle pain and swelling EXAM: LEFT ANKLE COMPLETE - 3+ VIEW COMPARISON:  02/23/2017 left ankle radiographs FINDINGS: No left ankle fracture or subluxation. No suspicious focal osseous lesion. Minimal diffuse left ankle soft tissue swelling. No radiopaque foreign body. Moderate degenerative changes in the tarsal joints. IMPRESSION: Minimal diffuse left ankle soft tissue swelling, with no fracture or subluxation. Electronically Signed   By: Delbert Phenix M.D.   On: 07/07/2017 10:40   Dg Knee Complete 4 Views Right  Result Date: 07/07/2017 CLINICAL DATA:  Right knee pain after fall today. EXAM: RIGHT KNEE - COMPLETE 4+ VIEW COMPARISON:  Radiographs of March 27, 2010. FINDINGS: No evidence of fracture, dislocation, or joint effusion. Severe narrowing of medial joint space is noted with osteophyte formation. Chondrocalcinosis is noted laterally. Vascular calcifications are noted. IMPRESSION: Severe degenerative joint disease is noted medially. No acute abnormality seen in the right knee. Electronically Signed   By: Lupita Raider, M.D.   On: 07/07/2017 10:51        Scheduled Meds: . allopurinol  300 mg Oral Daily  . amiodarone  200 mg Oral Daily  . apixaban  2.5 mg Oral BID  . atorvastatin  20 mg  Oral q1800  . buPROPion  75 mg Oral BID  . insulin aspart  0-9 Units Subcutaneous TID WC  . insulin glargine  25 Units Subcutaneous QHS  . metoprolol succinate  25 mg Oral BID  . pantoprazole  40 mg Oral Daily   Continuous Infusions: . sodium chloride 100 mL/hr at 07/07/17 1418  . cefTRIAXone (ROCEPHIN)  IV 1 g (07/08/17 1311)     LOS: 0 days        Glade Lloyd, MD [facility] Pager [phone]  If 7PM-7AM, please contact night-coverage [email or web address] Password [email or web address] 07/08/2017, 3:09 PM

## 2017-07-08 NOTE — Evaluation (Signed)
Physical Therapy Evaluation Patient Details Name: Danielle Benitez MRN: [id number] DOB: May 04, 1932 Today's Date: 07/08/2017   History of Present Illness  Danielle Benitez is a 83 y/o female with PMH significant for DM type, CKD stage 3, chronic diastolic heart failure, HLD, HTN. A. Fib (on eliquis) and prior hx of stroke ( apparently w/o residual deficit); who came via EMS due to acute change in mentation, decreased PO intake. + for UTI, possible PNA. Pt with recent respiratory infection, has frequent UTIs.    Clinical Impression  Pt presents with generalized weakness and an overall decrease in functional mobility secondary to above. PTA, pt mod indep with rollator for household amb; daughter provides 24/7 supervision and assist for ADLs as needed. Today, pt able to amb 50' with RW and min guard for balance; required multiple cues for proper gait mechanics and safety with RW. Educ on rollator use and importance of fall risk reduction. Pt would benefit from continued acute PT services to maximize functional mobility and independence prior to d/c with continued HHPT.     Follow Up Recommendations Home health PT;Supervision for mobility/OOB    Equipment Recommendations  None recommended by PT    Recommendations for Other Services       Precautions / Restrictions Precautions Precautions: Fall Precaution Comments: hx of multiple falls Required Braces or Orthoses: Other Brace/Splint Other Brace/Splint: neoprene knee splint (pt reports for comfort & stability) Restrictions Weight Bearing Restrictions: No      Mobility  Bed Mobility Overal bed mobility: Modified Independent             General bed mobility comments: used rail, increased time  Transfers Overall transfer level: Needs assistance Equipment used: Rolling walker (2 wheeled) Transfers: Sit to/from Stand Sit to Stand: Min guard         General transfer comment: Min guard for balance; cues for hand  placement  Ambulation/Gait Ambulation/Gait assistance: Min guard Ambulation Distance (Feet): 50 Feet Assistive device: Rolling walker (2 wheeled) Gait Pattern/deviations: Shuffle;Trunk flexed Gait velocity: Decreased Gait velocity interpretation: <1.8 ft/sec, indicative of risk for recurrent falls General Gait Details: Slow, shuffling gait with RW, requiring min guard for balance and repeated cues to keep RW closer to body; intermittently required assist to keep RW center and cues for turning. Multimodal cues for upright posture, but pt not maintaining this secondary to c/o back pain.  Stairs            Wheelchair Mobility    Modified Rankin (Stroke Patients Only)       Balance Overall balance assessment: Needs assistance   Sitting balance-Leahy Scale: Good Sitting balance - Comments: no LOB with socks   Standing balance support: Bilateral upper extremity supported;During functional activity Standing balance-Leahy Scale: Poor Standing balance comment: Reliant on BUE support for balance                             Pertinent Vitals/Pain Pain Assessment: No/denies pain    Home Living Family/patient expects to be discharged to:: Private residence Living Arrangements: Children;Other relatives Available Help at Discharge: Family;Friend(s);Available 24 hours/day Type of Home: Apartment Home Access: Level entry     Home Layout: One level Home Equipment: Walker - 2 wheels;Cane - quad;Walker - 4 wheels;Shower seat;Bedside commode;Grab bars - tub/shower;Hand held shower head      Prior Function Level of Independence: Needs assistance   Gait / Transfers Assistance Needed: Reports mod indep for household amb with  rollator; pt and daughter reports she does not leave house often as community amb is limited. Multiple falls recently which daughter reports she thinks is because pt forgets to keep rollator close by  ADL's / Homemaking Assistance Needed: assist with  showering  Comments: Daughter works from home and provides 24/7 supervision; currently getting HHPT      Hand Dominance   Dominant Hand: Right    Extremity/Trunk Assessment   Upper Extremity Assessment Upper Extremity Assessment: Generalized weakness    Lower Extremity Assessment Lower Extremity Assessment: Generalized weakness    Cervical / Trunk Assessment Cervical / Trunk Assessment: Kyphotic  Communication   Communication: HOH  Cognition Arousal/Alertness: Awake/alert Behavior During Therapy: WFL for tasks assessed/performed Overall Cognitive Status: Impaired/Different from baseline Area of Impairment: Safety/judgement;Memory                     Memory: Decreased short-term memory   Safety/Judgement: Decreased awareness of safety     General Comments: frequently walks away from her walker      General Comments General comments (skin integrity, edema, etc.): Daughter present throughout session and very supportive    Exercises     Assessment/Plan    PT Assessment Patient needs continued PT services  PT Problem List Decreased strength;Decreased range of motion;Decreased activity tolerance;Decreased balance;Decreased mobility;Decreased knowledge of use of DME;Decreased safety awareness       PT Treatment Interventions DME instruction;Gait training;Stair training;Functional mobility training;Therapeutic activities;Therapeutic exercise;Balance training;Patient/family education    PT Goals (Current goals can be found in the Care Plan section)  Acute Rehab PT Goals Patient Stated Goal: to go home tomorrow PT Goal Formulation: With patient Time For Goal Achievement: 07/22/17 Potential to Achieve Goals: Good    Frequency Min 3X/week   Barriers to discharge        Co-evaluation               AM-PAC PT "6 Clicks" Daily Activity  Outcome Measure Difficulty turning over in bed (including adjusting bedclothes, sheets and blankets)?:  None Difficulty moving from lying on back to sitting on the side of the bed? : None Difficulty sitting down on and standing up from a chair with arms (e.g., wheelchair, bedside commode, etc,.)?: A Little Help needed moving to and from a bed to chair (including a wheelchair)?: A Little Help needed walking in hospital room?: A Little Help needed climbing 3-5 steps with a railing? : A Lot 6 Click Score: 19    End of Session Equipment Utilized During Treatment: Gait belt Activity Tolerance: Patient tolerated treatment well;Patient limited by fatigue Patient left: in chair;with call bell/phone within reach;with family/visitor present Nurse Communication: Mobility status PT Visit Diagnosis: Unsteadiness on feet (R26.81);Repeated falls (R29.6);Muscle weakness (generalized) (M62.81)    Time: 1165-7903 PT Time Calculation (min) (ACUTE ONLY): 17 min   Charges:   PT Evaluation $PT Eval Moderate Complexity: 1 Mod     PT G Codes:       Ina Homes, PT, DPT Acute Rehab Services  Pager: [phone]  Malachy Chamber 07/08/2017, 2:06 PM

## 2017-07-08 NOTE — Progress Notes (Signed)
OT Cancellation Note  Patient Details Name: SAHNNON DEMSKE MRN: [id number] DOB: 11-May-1932   Cancelled Treatment:    Reason Eval/Treat Not Completed: Patient at procedure or test/ unavailable. Will try back.  Evern Bio 07/08/2017, 9:41 AM  07/08/2017 Martie Round, OTR/L Pager: [phone]

## 2017-07-08 NOTE — Evaluation (Signed)
Occupational Therapy Evaluation Patient Details Name: Danielle Benitez MRN: [id number] DOB: 03-13-1932 Today's Date: 07/08/2017    History of Present Illness INDICA SOKOLSKI is a 83 y/o female with PMH significant for DM type, CKD stage 3, chronic diastolic heart failure, HLD, HTN. A. Fib (on eliquis) and prior hx of stroke ( apparently w/o residual deficit); who came via EMS due to acute change in mentation, decreased PO intake. + for UTI, possible PNA. Pt with recent respiratory infection, has frequent UTIs.   Clinical Impression   Pt is functioning near her baseline in ADL and mobility. She frequently uses her rollator unsafely, leading to falls. Will follow to educate her in use of toileting aid.     Follow Up Recommendations  No OT follow up    Equipment Recommendations  None recommended by OT    Recommendations for Other Services       Precautions / Restrictions Precautions Precautions: Fall Precaution Comments: hx of multiple falls Required Braces or Orthoses: Other Brace/Splint Other Brace/Splint: neoprene knee splint Restrictions Weight Bearing Restrictions: No      Mobility Bed Mobility Overal bed mobility: Modified Independent             General bed mobility comments: used rail, increased time  Transfers Overall transfer level: Needs assistance Equipment used: Rolling walker (2 wheeled) Transfers: Sit to/from Stand Sit to Stand: Min guard              Balance Overall balance assessment: Needs assistance   Sitting balance-Leahy Scale: Good Sitting balance - Comments: no LOB with socks     Standing balance-Leahy Scale: Poor                             ADL either performed or assessed with clinical judgement   ADL Overall ADL's : At baseline                                       General ADL Comments: pt with difficulty wiping front to back, may be contributing to UTI frequency, will educate in use of toilet tongs      Vision Patient Visual Report: No change from baseline       Perception     Praxis      Pertinent Vitals/Pain Pain Assessment: No/denies pain     Hand Dominance Right   Extremity/Trunk Assessment Upper Extremity Assessment Upper Extremity Assessment: Generalized weakness (longstanding shoulder limitations)   Lower Extremity Assessment Lower Extremity Assessment: Defer to PT evaluation       Communication Communication Communication: HOH   Cognition Arousal/Alertness: Awake/alert Behavior During Therapy: WFL for tasks assessed/performed Overall Cognitive Status: Impaired/Different from baseline Area of Impairment: Safety/judgement                         Safety/Judgement: Decreased awareness of safety     General Comments: frequently walks away from her walker   General Comments       Exercises     Shoulder Instructions      Home Living Family/patient expects to be discharged to:: Private residence Living Arrangements: Children;Other relatives Available Help at Discharge: Family;Friend(s);Available 24 hours/day Type of Home: Apartment Home Access: Level entry     Home Layout: One level     Bathroom Shower/Tub: IT trainer: Standard  Bathroom Accessibility: No   Home Equipment: Walker - 2 wheels;Cane - quad;Walker - 4 wheels;Shower seat;Bedside commode;Grab bars - tub/shower;Hand held shower head          Prior Functioning/Environment Level of Independence: Needs assistance  Gait / Transfers Assistance Needed: RW for ambulation  ADL's / Homemaking Assistance Needed: assist with showering   Comments: Daughter works from home and provides 24/7 supervision; currently getting HHPT         OT Problem List: Decreased knowledge of use of DME or AE;Impaired balance (sitting and/or standing);Obesity      OT Treatment/Interventions:      OT Goals(Current goals can be found in the care plan section) Acute  Rehab OT Goals Patient Stated Goal: to go home tomorrow OT Goal Formulation: With patient Time For Goal Achievement: 07/15/17 Potential to Achieve Goals: Good ADL Goals Pt Will Perform Toileting - Clothing Manipulation and hygiene: with modified independence;sit to/from stand;with adaptive equipment  OT Frequency:     Barriers to D/C:            Co-evaluation              AM-PAC PT "6 Clicks" Daily Activity     Outcome Measure Help from another person eating meals?: None Help from another person taking care of personal grooming?: A Little Help from another person toileting, which includes using toliet, bedpan, or urinal?: A Little Help from another person bathing (including washing, rinsing, drying)?: A Little Help from another person to put on and taking off regular upper body clothing?: None Help from another person to put on and taking off regular lower body clothing?: A Little 6 Click Score: 20   End of Session Equipment Utilized During Treatment: Gait belt;Rolling walker  Activity Tolerance: Patient tolerated treatment well Patient left: in bed;with call bell/phone within reach;with family/visitor present  OT Visit Diagnosis: Unsteadiness on feet (R26.81);Other abnormalities of gait and mobility (R26.89)                Time: 1610-9604 OT Time Calculation (min): 34 min Charges:  OT General Charges $OT Visit: 1 Visit OT Evaluation $OT Eval Moderate Complexity: 1 Mod G-Codes: OT G-codes **NOT FOR INPATIENT CLASS** Functional Assessment Tool Used: Clinical judgement Functional Limitation: Self care Self Care Current Status (V4098): At least 1 percent but less than 20 percent impaired, limited or restricted Self Care Goal Status (J1914): At least 1 percent but less than 20 percent impaired, limited or restricted   Evern Bio 07/08/2017, 1:05 PM  07/08/2017 Martie Round, OTR/L Pager: [phone]

## 2017-07-08 NOTE — Care Management Obs Status (Signed)
MEDICARE OBSERVATION STATUS NOTIFICATION   Patient Details  Name: Danielle Benitez MRN: [id number] Date of Birth: 10-04-1932   Medicare Observation Status Notification Given:  Yes    Kingsley Plan, RN 07/08/2017, 2:35 PM

## 2017-07-08 NOTE — Care Management Note (Signed)
Case Management Note  Patient Details  Name: Danielle Benitez MRN: [id number] Date of Birth: 1931-12-30  Subjective/Objective:                    Action/Plan:  Consult for home health needs. Await PT/OT evals Expected Discharge Date:                  Expected Discharge Plan:     In-House Referral:     Discharge planning Services  CM Consult  Post Acute Care Choice:  Durable Medical Equipment, Home Health Choice offered to:     DME Arranged:    DME Agency:     HH Arranged:    HH Agency:     Status of Service:  In process, will continue to follow  If discussed at Long Length of Stay Meetings, dates discussed:    Additional Comments:  Kingsley Plan, RN 07/08/2017, 10:02 AM

## 2017-07-09 DIAGNOSIS — J189 Pneumonia, unspecified organism: Secondary | ICD-10-CM

## 2017-07-09 LAB — CBC WITH DIFFERENTIAL/PLATELET
BASOS PCT: 0 %
Basophils Absolute: 0 10*3/uL (ref 0.0–0.1)
EOS PCT: 2 %
Eosinophils Absolute: 0.2 10*3/uL (ref 0.0–0.7)
HCT: 31.6 % — ABNORMAL LOW (ref 36.0–46.0)
Hemoglobin: 9.6 g/dL — ABNORMAL LOW (ref 12.0–15.0)
LYMPHS ABS: 1.8 10*3/uL (ref 0.7–4.0)
Lymphocytes Relative: 22 %
MCH: 28.4 pg (ref 26.0–34.0)
MCHC: 30.4 g/dL (ref 30.0–36.0)
MCV: 93.5 fL (ref 78.0–100.0)
Monocytes Absolute: 0.9 10*3/uL (ref 0.1–1.0)
Monocytes Relative: 10 %
Neutro Abs: 5.7 10*3/uL (ref 1.7–7.7)
Neutrophils Relative %: 66 %
PLATELETS: 92 10*3/uL — AB (ref 150–400)
RBC: 3.38 MIL/uL — AB (ref 3.87–5.11)
RDW: 19.3 % — AB (ref 11.5–15.5)
WBC: 8.6 10*3/uL (ref 4.0–10.5)

## 2017-07-09 LAB — COMPREHENSIVE METABOLIC PANEL
ALT: 28 U/L (ref 14–54)
AST: 72 U/L — ABNORMAL HIGH (ref 15–41)
Albumin: 2.1 g/dL — ABNORMAL LOW (ref 3.5–5.0)
Alkaline Phosphatase: 134 U/L — ABNORMAL HIGH (ref 38–126)
Anion gap: 9 (ref 5–15)
BUN: 29 mg/dL — ABNORMAL HIGH (ref 6–20)
CHLORIDE: 103 mmol/L (ref 101–111)
CO2: 25 mmol/L (ref 22–32)
CREATININE: 2.05 mg/dL — AB (ref 0.44–1.00)
Calcium: 8.5 mg/dL — ABNORMAL LOW (ref 8.9–10.3)
GFR, EST AFRICAN AMERICAN: 25 mL/min — AB (ref >60)
GFR, EST NON AFRICAN AMERICAN: 21 mL/min — AB (ref >60)
Glucose, Bld: 150 mg/dL — ABNORMAL HIGH (ref 65–99)
POTASSIUM: 3.7 mmol/L (ref 3.5–5.1)
Sodium: 137 mmol/L (ref 135–145)
Total Bilirubin: 1.2 mg/dL (ref 0.3–1.2)
Total Protein: 6.4 g/dL — ABNORMAL LOW (ref 6.5–8.1)

## 2017-07-09 LAB — GLUCOSE, CAPILLARY
GLUCOSE-CAPILLARY: 146 mg/dL — AB (ref 65–99)
GLUCOSE-CAPILLARY: 244 mg/dL — AB (ref 65–99)
Glucose-Capillary: 160 mg/dL — ABNORMAL HIGH (ref 65–99)

## 2017-07-09 LAB — URINE CULTURE: Culture: >=100000 — AB

## 2017-07-09 LAB — MAGNESIUM: MAGNESIUM: 1.9 mg/dL (ref 1.7–2.4)

## 2017-07-09 MED ORDER — INFLUENZA VAC SPLIT HIGH-DOSE 0.5 ML IM SUSY
0.5000 mL | PREFILLED_SYRINGE | INTRAMUSCULAR | Status: AC | PRN
  Administered 2017-07-09: 0.5 mL via INTRAMUSCULAR

## 2017-07-09 MED ORDER — CIPROFLOXACIN HCL 250 MG PO TABS
250.0000 mg | ORAL_TABLET | Freq: Two times a day (BID) | ORAL | Status: DC
  Administered 2017-07-09: 250 mg via ORAL
  Filled 2017-07-09: qty 1

## 2017-07-09 MED ORDER — TORSEMIDE 20 MG PO TABS: 20.0000 mg | ORAL_TABLET | Freq: Every day | ORAL | Status: DC

## 2017-07-09 MED ORDER — ALLOPURINOL 100 MG PO TABS: 200.0000 mg | ORAL_TABLET | Freq: Every day | ORAL | 0 refills | Status: AC

## 2017-07-09 MED ORDER — LEVOFLOXACIN 250 MG PO TABS: 250.0000 mg | ORAL_TABLET | Freq: Every day | ORAL | 0 refills | Status: AC

## 2017-07-09 NOTE — Progress Notes (Signed)
Discharge instructions gone over with patient and daughter. Home medications discussed. Follow up appointments made. Prescriptions already picked up by daughter.  Completion of antibiotic therapy discussed. Diet, activity, and reasons to call the doctor discussed. Proper perineum hygiene discussed. Patient verbalized understanding of instructions. Patients existing home health agency will continue to follow patient.

## 2017-07-09 NOTE — Progress Notes (Signed)
Occupational Therapy Treatment Patient Details Name: Danielle Benitez MRN: [id number] DOB: [birth year] Today's Date: 07/09/2017    History of present illness Danielle Benitez is a 83 y/o female with PMH significant for DM type, CKD stage 3, chronic diastolic heart failure, HLD, HTN. A. Fib (on eliquis) and prior hx of stroke ( apparently w/o residual deficit); who came via EMS due to acute change in mentation, decreased PO intake. + for UTI, possible PNA. Pt with recent respiratory infection, has frequent UTIs.   OT comments  OT session with focus on introduction and demo of toilet aide options to increase safety, independence, and improve hygiene after voiding.  Pt. Verbalized interest in A/E and states she also uses wet wipes at home that are helpful.  Next visit plan to review with dtr. Who is her primary caregiver.    Follow Up Recommendations  No OT follow up    Equipment Recommendations  None recommended by OT    Recommendations for Other Services      Precautions / Restrictions Precautions Precautions: Fall Precaution Comments: hx of multiple falls Required Braces or Orthoses: Other Brace/Splint Other Brace/Splint: neoprene knee splint (pt reports for comfort & stability)       Mobility Bed Mobility               General bed mobility comments: used rail, increased time  Transfers Overall transfer level: Needs assistance Equipment used: Rolling walker (2 wheeled) Transfers: Sit to/from Stand Sit to Stand: Min assist         General transfer comment: Min guard for balance; cues for hand placement    Balance                                           ADL either performed or assessed with clinical judgement   ADL Overall ADL's : Needs assistance/impaired                         Toilet Transfer: Min guard;Cueing for safety;Cueing for sequencing;Ambulation;RW;Grab Paramedic Details (indicate cue type and reason):  poor RW management, allows RW to get very far in front of her, lots of bumping into and difficulty negotiating around furniture and other in room obstacles Toileting- Clothing Manipulation and Hygiene: Sit to/from stand;Minimal assistance Toileting - Clothing Manipulation Details (indicate cue type and reason): able to perform pericare to buttocks after BM without difficulty.  pericare after voiding proves to be more of a challange due to having to bend far forward to get below stomach.  provided examples of toileting A/E ie: tongs with explanation and simulated use.  pt. verbalized understanding but would likely benefit from dtr. being shown the optins as she is primary caregiver     Functional mobility during ADLs: Minimal assistance;Rolling walker       Vision       Perception     Praxis      Cognition Arousal/Alertness: Awake/alert Behavior During Therapy: WFL for tasks assessed/performed Overall Cognitive Status: Impaired/Different from baseline Area of Impairment: Safety/judgement;Memory                     Memory: Decreased short-term memory   Safety/Judgement: Decreased awareness of safety     General Comments: frequently walks away from her walker        Exercises  Shoulder Instructions       General Comments      Pertinent Vitals/ Pain       Pain Assessment: No/denies pain  Home Living                                          Prior Functioning/Environment              Frequency           Progress Toward Goals  OT Goals(current goals can now be found in the care plan section)  Progress towards OT goals: Progressing toward goals     Plan Discharge plan remains appropriate    Co-evaluation                 AM-PAC PT "6 Clicks" Daily Activity     Outcome Measure   Help from another person eating meals?: None Help from another person taking care of personal grooming?: A Little Help from another person  toileting, which includes using toliet, bedpan, or urinal?: A Little Help from another person bathing (including washing, rinsing, drying)?: A Little Help from another person to put on and taking off regular upper body clothing?: None Help from another person to put on and taking off regular lower body clothing?: A Little 6 Click Score: 20    End of Session Equipment Utilized During Treatment: Gait belt;Rolling walker  OT Visit Diagnosis: Unsteadiness on feet (R26.81);Other abnormalities of gait and mobility (R26.89)   Activity Tolerance Patient tolerated treatment well   Patient Left in chair;with call bell/phone within reach   Nurse Communication Other (comment) (discussed with CNA if external urine catheter needed to be placed again now that pt. was oob)        Time: 6004-5997 OT Time Calculation (min): 32 min  Charges: OT General Charges $OT Visit: 1 Visit OT Treatments $Self Care/Home Management : 23-37 mins  Robet Leu, COTA/L 07/09/2017, 8:51 AM

## 2017-07-09 NOTE — Progress Notes (Addendum)
NCM contacted Elnita Maxwell with [facility] to notify her of patient's discharge, Soc will start tomorrow for HHPT/HHRN.

## 2017-07-09 NOTE — Discharge Summary (Signed)
Physician Discharge Summary  Danielle Benitez YWV:[id number] DOB: 07-08-1932 DOA: 07/07/2017  PCP: Tonia Ghent, MD  Admit date: 07/07/2017 Discharge date: 07/09/2017  Admitted From:Home Disposition:  home  Recommendations for Outpatient Follow-up:  1. Follow up with PCP in 1week with repeat BMP/CBC 2. Follow-up with cardiology/Dr. Aundra Dubin in 1-2 weeks 3. Follow up in the ED if symptoms worsen or new appear   Home Health: yes  Equipment/Devices: none  Discharge Condition: stable  CODE STATUS: full  Diet recommendation: Heart Healthy / Carb Modified  Brief/Interim Summary: 83 year old female with history of CHF, CAD stage III, PAF on Eliquis, UTI, encephalopathy presented on 07/07/2017 with confusion. She was admitted with probable urinary tract infection and started on antibiotics and intravenous fluids. Patient's condition improved. Urine culture grew Citrobacter. Blood cultures have been negative. Mental status has much improved and she wants to go home.  Discharge Diagnoses:  Active Problems:   DM, type 2, with renal complications    Dyslipidemia   Mild CAD - 20-30% RCA in 2013   Fatigue   Mild aortic stenosis   S/P cardiac pacemaker procedure, PPM Medtronic REVO placed 01/11/2012   Depression   History of pacemaker - Medtronic   Anticoagulated- Eliquis   Normochromic normocytic anemia   Chronic systolic congestive heart failure, NYHA class 3 (HCC)   Fall   Thrombocytopenia (HCC)   UTI (urinary tract infection)   Acute metabolic encephalopathy   Acute lower UTI   Diabetes mellitus with diabetic nephropathy (HCC)   Chronic diastolic CHF (congestive heart failure) (HCC)   Stage 3 chronic kidney disease   Confusion   Upper respiratory tract infection  Acute metabolic encephalopathy likely secondary to UTI and severe dehydration Improving. Mental status much improved Off IV fluids currently and tolerating diet  Probable UTI Currently on Rocephin. Urine culture is  growing Citrobacter resistant to Rocephin. Discharged home on Levaquin  Recent URI with probable commented acquired pneumonia Currently on Rocephin and Zithromax. Blood cultures negative so far. On room air. Discharge home on oral Levaquin renally dosed   Chronic Atrial Fibrillation CHA2DS2-VASc score 7 Rate controlled Continue Apixaban   Deconditioned withfalls risk. Last recent carotid Doppler without significant stenosis. Last 2-D echo  85/46/2703 grade 1 diastolic dysfunction, EF 50% to 65% Patient will need home PT and nursing evaluation and follow-up  Hypertension  Controlled Continue home anti-hypertensive medications    Type II Diabetes with Neuropathy  Continue home regimen. Outpatient follow-up  Hyperlipidemia Continue home statins   Chronic kidney disease stage 4  Creatinine improved and at baseline. Outpatient follow-up BMP  Thrombocytopenia, chronic  Stable. Monitor  Anemia of chronic disease Stable hemoglobin. Monitor  Chronic diastolic CHFAppears compensated, Last 2-D echo 09/38/1829HBZJI 1 diastolic dysfunction, EF 96% to 65%  restart torsemide but at 20 mg daily and follow-up with primary care provider and or cardiologist regarding up-titrating the dose.  Depression Continue home Wellbutrin  GERD,no acute symptoms Continue PPI  Gout, no acute flare Decrease allopurinol to 200 mg daily because of renal function as per pharmacy recommendations   Discharge Instructions  Discharge Instructions    Call MD for:  difficulty breathing, headache or visual disturbances    Complete by:  As directed    Call MD for:  extreme fatigue    Complete by:  As directed    Call MD for:  hives    Complete by:  As directed    Call MD for:  persistant dizziness or light-headedness    Complete  by:  As directed    Call MD for:  persistant nausea and vomiting    Complete by:  As directed    Call MD for:  severe uncontrolled pain     Complete by:  As directed    Call MD for:  temperature >100.4    Complete by:  As directed    Diet - low sodium heart healthy    Complete by:  As directed    Diet Carb Modified    Complete by:  As directed    Increase activity slowly    Complete by:  As directed      Allergies as of 07/09/2017      Reactions   Beta Adrenergic Blockers Other (See Comments)   Couldn't speak or hear Metoprolol seems ok   Jardiance [empagliflozin] Other (See Comments)   Rash, kidney issues   Lexapro [escitalopram Oxalate] Other (See Comments)   Lack of effect.    Spironolactone Other (See Comments)   Hyperkalemia   Zoloft [sertraline Hcl] Other (See Comments)   Lack of effect.       Medication List    TAKE these medications   albuterol 108 (90 Base) MCG/ACT inhaler Commonly known as:  PROVENTIL HFA;VENTOLIN HFA Inhale 2 puffs into the lungs every 4 (four) hours as needed for wheezing or shortness of breath (cough, shortness of breath or wheezing.).   allopurinol 100 MG tablet Commonly known as:  ZYLOPRIM Take 2 tablets (200 mg total) by mouth daily. What changed:  medication strength  how much to take  how to take this  when to take this  additional instructions   amiodarone 200 MG tablet Commonly known as:  PACERONE TAKE 200MG BY MOUTH DAILY   apixaban 2.5 MG Tabs tablet Commonly known as:  ELIQUIS TAKE 2.5MG BY MOUTH TWICE A DAY   atorvastatin 20 MG tablet Commonly known as:  LIPITOR Take 1 tablet (20 mg total) by mouth daily at 6 PM.   buPROPion 75 MG tablet Commonly known as:  WELLBUTRIN Take 1 tablet (75 mg total) by mouth 2 (two) times daily.   fluticasone 50 MCG/ACT nasal spray Commonly known as:  FLONASE Place 1 spray into both nostrils daily as needed for allergies or rhinitis.   GAS RELIEF 80 MG chewable tablet Generic drug:  simethicone CHEW ONE TABLET BY MOUTH EVERY 6 HOURS AS NEEDED FOR GAS.   glimepiride 1 MG tablet Commonly known as:  AMARYL TAKE  1MG BY MOUTH EVERY MORNING WITH BREAKFAST   glucose blood test strip Commonly known as:  ONE TOUCH ULTRA TEST CHECK BLOOD SUGAR TWICE A DAY AND AS DIRECTED;DX E11.21   Insulin Glargine 100 UNIT/ML Solostar Pen Commonly known as:  LANTUS SOLOSTAR Inject 16 Units into the skin See admin instructions. Daily at 10pm. If blood sugar 100-150 give same units as last dose, if greater than 151 increase by 1 unit.   levofloxacin 250 MG tablet Commonly known as:  LEVAQUIN Take 1 tablet (250 mg total) by mouth daily.   loratadine 5 MG chewable tablet Commonly known as:  CLARITIN CHILDRENS Chew 1 tablet (5 mg total) by mouth daily.   metoprolol succinate 25 MG 24 hr tablet Commonly known as:  TOPROL-XL Take 1 tablet (25 mg total) by mouth 2 (two) times daily.   ONE TOUCH ULTRA 2 w/Device Kit Check blood sugar twice a day and as directed Dx E11.21   ONE-A-DAY 50 PLUS PO Take 1 capsule by mouth daily.   pantoprazole  40 MG tablet Commonly known as:  PROTONIX TAKE 40MG BY MOUTH DAILY   Pen Needles 30G X 5 MM Misc Inject 1 Dose as directed daily. Use with insulin pen   potassium chloride SA 20 MEQ tablet Commonly known as:  KLOR-CON M20 Take 2 tablets (40 mEq total) by mouth daily.   senna-docusate 8.6-50 MG tablet Commonly known as:  Senokot-S Take 1 tablet by mouth daily as needed for mild constipation.   torsemide 20 MG tablet Commonly known as:  DEMADEX Take 1 tablet (20 mg total) by mouth daily. What changed:  how much to take   traZODone 50 MG tablet Commonly known as:  DESYREL Take 0.5-1 tablets (25-50 mg total) by mouth at bedtime as needed for sleep. Don't take >58m in a day.            Discharge Care Instructions        Start     Ordered   07/09/17 0000  allopurinol (ZYLOPRIM) 100 MG tablet  Daily     07/09/17 1046   07/09/17 0000  torsemide (DEMADEX) 20 MG tablet  Daily    Comments:  In place of Lasix   07/09/17 1046   07/09/17 0000  levofloxacin  (LEVAQUIN) 250 MG tablet  Daily     07/09/17 1046   07/09/17 0000  Increase activity slowly     07/09/17 1046   07/09/17 0000  Diet - low sodium heart healthy     07/09/17 1046   07/09/17 0000  Diet Carb Modified     07/09/17 1046   07/09/17 0000  Call MD for:  temperature >100.4     07/09/17 1046   07/09/17 0000  Call MD for:  persistant nausea and vomiting     07/09/17 1046   07/09/17 0000  Call MD for:  severe uncontrolled pain     07/09/17 1046   07/09/17 0000  Call MD for:  difficulty breathing, headache or visual disturbances     07/09/17 1046   07/09/17 0000  Call MD for:  hives     07/09/17 1046   07/09/17 0000  Call MD for:  persistant dizziness or light-headedness     07/09/17 1046   07/09/17 0000  Call MD for:  extreme fatigue     07/09/17 1046     Follow-up Information    DTonia Ghent MD Follow up in 1 week(s).   Specialty:  Family Medicine Why:  with repeat CBC/BMP Contact information: [phone]        MLarey Dresser MD Follow up in 1 week(s).   Specialty:  Cardiology Contact information: [address] [phone]         Allergies  Allergen Reactions  . Beta Adrenergic Blockers Other (See Comments)    Couldn't speak or hear Metoprolol seems ok  . Jardiance [Empagliflozin] Other (See Comments)    Rash, kidney issues  . Lexapro [Escitalopram Oxalate] Other (See Comments)    Lack of effect.   .Marland KitchenSpironolactone Other (See Comments)    Hyperkalemia  . Zoloft [Sertraline Hcl] Other (See Comments)    Lack of effect.     Consultations:  none   Procedures/Studies: Dg Chest 2 View  Result Date: 07/08/2017 CLINICAL DATA:  Fall yesterday. Confusion. CHF. Diabetes. Follow-up of abnormal radiograph. EXAM: CHEST  2 VIEW COMPARISON:  07/07/2017 FINDINGS: Both views are degraded by patient size and positioning.  Midline trachea. Cardiomegaly accentuated by  AP portable technique. Posterior pleural fluid is again identified, likely left-sided. No pneumothorax. Low lung volumes with resultant pulmonary interstitial prominence. Mild left base airspace disease. Degenerative changes of both shoulders. IMPRESSION: Left base airspace disease and adjacent pleural fluid. The airspace disease could represent atelectasis or infection. Decreased sensitivity and specificity exam due to technique related factors, as described above. Cardiomegaly. Electronically Signed   By: Abigail Miyamoto M.D.   On: 07/08/2017 10:11   Dg Chest 2 View  Result Date: 07/07/2017 CLINICAL DATA:  [place] today with shortness of breath, left ankle pain and swelling EXAM: CHEST  2 VIEW COMPARISON:  Chest x-ray of 06/03/2017 FINDINGS: There is opacity posteriorly at the lung base on the lateral view probably representing pleural effusion rolling posteriorly, possibly on the left. The lungs are not well aerated. Minimal opacity is also noted in the right mid upper lung field. This could represent atelectasis, but developing pneumonia cannot be excluded. The left lung otherwise is clear. Heart size is stable with mild cardiomegaly present. Permanent pacemaker remains. There are degenerative changes throughout the thoracic spine. IMPRESSION: 1. Opacity posteriorly most consistent with pleural effusion possibly on the left. 2. Opacity in the right mid lung may represent atelectasis, but pneumonia cannot be excluded. Recommend followup. Electronically Signed   By: Ivar Drape M.D.   On: 07/07/2017 10:42   Dg Ankle Complete Left  Result Date: 07/07/2017 CLINICAL DATA:  Fall today with left ankle pain and swelling EXAM: LEFT ANKLE COMPLETE - 3+ VIEW COMPARISON:  02/23/2017 left ankle radiographs FINDINGS: No left ankle fracture or subluxation. No suspicious focal osseous lesion. Minimal diffuse left ankle soft tissue swelling. No radiopaque foreign body. Moderate degenerative changes in the tarsal joints.  IMPRESSION: Minimal diffuse left ankle soft tissue swelling, with no fracture or subluxation. Electronically Signed   By: Ilona Sorrel M.D.   On: 07/07/2017 10:40   Dg Knee Complete 4 Views Right  Result Date: 07/07/2017 CLINICAL DATA:  Right knee pain after fall today. EXAM: RIGHT KNEE - COMPLETE 4+ VIEW COMPARISON:  Radiographs of March 27, 2010. FINDINGS: No evidence of fracture, dislocation, or joint effusion. Severe narrowing of medial joint space is noted with osteophyte formation. Chondrocalcinosis is noted laterally. Vascular calcifications are noted. IMPRESSION: Severe degenerative joint disease is noted medially. No acute abnormality seen in the right knee. Electronically Signed   By: Marijo Conception, M.D.   On: 07/07/2017 10:51       Subjective: Patient seen and examined at bedside. She feels much better. She wants to go home. She is tolerating diet  Discharge Exam: Vitals:   07/08/17 2203 07/09/17 0615  BP: (!) 138/59 (!) 122/54  Pulse: 70 69  Resp: 19 18  Temp: 98.3 F (36.8 C) 99.3 F (37.4 C)  SpO2: 99% 98%   Vitals:   07/08/17 2041 07/08/17 2203 07/09/17 0615 07/09/17 0900  BP: 113/69 (!) 138/59 (!) 122/54   Pulse: (!) 108 70 69   Resp: _0 Temp: 98.8 F (37.1 C) 98.3 F (36.8 C) 99.3 F (37.4 C)   TempSrc: Oral Oral    SpO2: 95% 99% 98%   Weight:    84.8 kg (186 lb 15.2 oz)  Height:    5' 1" (1.549 m)    General: Pt is alert, awake, not in acute distress Cardiovascular: rate controlled, S1/S2 + Respiratory: bilateral decreased breath sounds at bases  Abdominal: Soft, NT, ND, bowel sounds +  Extremities: trace edema, no cyanosis    The results of significant diagnostics from this hospitalization (including imaging, microbiology, ancillary and laboratory) are listed below for reference.     Microbiology: Recent Results (from the past 240 hour(s))  Urine culture     Status: Abnormal   Collection Time: 07/07/17  9:51 AM  Result Value Ref Range  Status   Specimen Description URINE, RANDOM  Final   Special Requests NONE  Final   Culture >=100,000 COLONIES/mL CITROBACTER FREUNDII (A)  Final   Report Status 07/09/2017 FINAL  Final   Organism ID, Bacteria CITROBACTER FREUNDII (A)  Final      Susceptibility   Citrobacter freundii - MIC*    CEFAZOLIN >=64 RESISTANT Resistant     CEFTRIAXONE >=64 RESISTANT Resistant     CIPROFLOXACIN <=0.25 SENSITIVE Sensitive     GENTAMICIN <=1 SENSITIVE Sensitive     IMIPENEM 2 SENSITIVE Sensitive     NITROFURANTOIN <=16 SENSITIVE Sensitive     TRIMETH/SULFA <=20 SENSITIVE Sensitive     PIP/TAZO 64 INTERMEDIATE Intermediate     * >=100,000 COLONIES/mL CITROBACTER FREUNDII     Labs: BNP (last 3 results)  Recent Labs  03/31/17 2000 06/03/17 1338 06/14/17 1545  BNP 398.4* 303.4* 572.6*   Basic Metabolic Panel:  Recent Labs Lab 07/04/17 1118 07/07/17 0913 07/08/17 0524 07/09/17 0441  NA 139 135 136 137  K 4.6 4.1 3.7 3.7  CL 101 97* 104 103  CO2 32 _0 GLUCOSE 302* 203* 143* 150*  BUN 33* 32* 31* 29*  CREATININE 2.33* 2.58* 2.24* 2.05*  CALCIUM 9.8 8.9 8.3* 8.5*  MG  --   --   --  1.9   Liver Function Tests:  Recent Labs Lab 07/07/17 0913 07/08/17 0524 07/09/17 0441  AST 70* 68* 72*  ALT _1 ALKPHOS 132* 123 134*  BILITOT 1.7* 1.3* 1.2  PROT 7.5 6.0* 6.4*  ALBUMIN 2.5* 2.1* 2.1*   No results for input(s): LIPASE, AMYLASE in the last 168 hours. No results for input(s): AMMONIA in the last 168 hours. CBC:  Recent Labs Lab 07/04/17 1118 07/07/17 0913 07/08/17 0524 07/09/17 0441  WBC 8.8 9.0 8.0 8.6  NEUTROABS 5.5 6.1  --  5.7  HGB 10.5* 10.8* 9.2* 9.6*  HCT 33.9* 33.8* 29.9* 31.6*  MCV 96.3 92.9 93.7 93.5  PLT 113.0* 101* 85* 92*   Cardiac Enzymes: No results for input(s): CKTOTAL, CKMB, CKMBINDEX, TROPONINI in the last 168 hours. BNP: Invalid input(s): POCBNP CBG:  Recent Labs Lab 07/08/17 1227 07/08/17 1800 07/08/17 2352 07/09/17 0017  07/09/17 0610  GLUCAP 217* 370* 263* 244* 146*   D-Dimer No results for input(s): DDIMER in the last 72 hours. Hgb A1c No results for input(s): HGBA1C in the last 72 hours. Lipid Profile No results for input(s): CHOL, HDL, LDLCALC, TRIG, CHOLHDL, LDLDIRECT in the last 72 hours. Thyroid function studies No results for input(s): TSH, T4TOTAL, T3FREE, THYROIDAB in the last 72 hours.  Invalid input(s): FREET3 Anemia work up No results for input(s): VITAMINB12, FOLATE, FERRITIN, TIBC, IRON, RETICCTPCT in the last 72 hours. Urinalysis    Component Value Date/Time   COLORURINE YELLOW 07/07/2017 0945   APPEARANCEUR HAZY (A) 07/07/2017 0945   LABSPEC 1.011 07/07/2017 0945   PHURINE 6.0 07/07/2017 0945   GLUCOSEU NEGATIVE 07/07/2017 0945   HGBUR NEGATIVE 07/07/2017 0945   BILIRUBINUR NEGATIVE 07/07/2017 0945   BILIRUBINUR 1+ 09/20/2016 1424   KETONESUR NEGATIVE 07/07/2017 0945   PROTEINUR NEGATIVE  07/07/2017 0945   UROBILINOGEN 0.2 09/20/2016 1424   UROBILINOGEN 0.2 08/02/2015 0330   NITRITE NEGATIVE 07/07/2017 0945   LEUKOCYTESUR LARGE (A) 07/07/2017 0945   Sepsis Labs Invalid input(s): PROCALCITONIN,  WBC,  LACTICIDVEN Microbiology Recent Results (from the past 240 hour(s))  Urine culture     Status: Abnormal   Collection Time: 07/07/17  9:51 AM  Result Value Ref Range Status   Specimen Description URINE, RANDOM  Final   Special Requests NONE  Final   Culture >=100,000 COLONIES/mL CITROBACTER FREUNDII (A)  Final   Report Status 07/09/2017 FINAL  Final   Organism ID, Bacteria CITROBACTER FREUNDII (A)  Final      Susceptibility   Citrobacter freundii - MIC*    CEFAZOLIN >=64 RESISTANT Resistant     CEFTRIAXONE >=64 RESISTANT Resistant     CIPROFLOXACIN <=0.25 SENSITIVE Sensitive     GENTAMICIN <=1 SENSITIVE Sensitive     IMIPENEM 2 SENSITIVE Sensitive     NITROFURANTOIN <=16 SENSITIVE Sensitive     TRIMETH/SULFA <=20 SENSITIVE Sensitive     PIP/TAZO 64 INTERMEDIATE  Intermediate     * >=100,000 COLONIES/mL CITROBACTER FREUNDII     Time coordinating discharge: 35 minutes  SIGNED:   Aline August, MD  [facility] 07/09/2017, 10:47 AM Pager: [phone]  If 7PM-7AM, please contact night-coverage [email or web address] Password [email or web address]

## 2017-07-11 ENCOUNTER — Telehealth (HOSPITAL_COMMUNITY): Payer: Self-pay | Admitting: *Deleted

## 2017-07-11 ENCOUNTER — Telehealth: Payer: Self-pay | Admitting: *Deleted

## 2017-07-11 DIAGNOSIS — I5032 Chronic diastolic (congestive) heart failure: Secondary | ICD-10-CM

## 2017-07-11 NOTE — Telephone Encounter (Signed)
Restart prior doses and get BMET on Friday.

## 2017-07-11 NOTE — Telephone Encounter (Signed)
Received pt's approval for Cardiomems, [id number].  Pt sch for implant Mon 10/8 at 9:30 am.  [name] daughter aware and agreeable, instructions reviewed w/her via phone including pt needs to hold Eliquis Sat and Sun before implant per Dr Shirlee Latch

## 2017-07-11 NOTE — Telephone Encounter (Signed)
[name] daughter called to let us know pt was in the hospital last week for UTI and dehydration and her Torsemide was held for 4 days and she was discharged on only 20 mg daily and KCL still at 40 meq daily.  Prior to hospitalization pt was on Torsemide 80 mg daily and KCL 40 meq daily  She would like Dr Alford Highland recommendation on what dose of Torsemide and KCL pt needs, will send to him for review and call her back.

## 2017-07-11 NOTE — Telephone Encounter (Signed)
Lm requesting return call to complete TCM and confirm hosp f/u appt  

## 2017-07-12 ENCOUNTER — Other Ambulatory Visit (HOSPITAL_COMMUNITY): Payer: Self-pay | Admitting: *Deleted

## 2017-07-12 DIAGNOSIS — I5032 Chronic diastolic (congestive) heart failure: Secondary | ICD-10-CM

## 2017-07-12 MED ORDER — TORSEMIDE 20 MG PO TABS: 80.0000 mg | ORAL_TABLET | Freq: Every day | ORAL | Status: AC

## 2017-07-12 NOTE — Telephone Encounter (Signed)
[name] daughter aware and agreeable, she states pt is seeing PCP on Friday and will get labs at his office, order placed

## 2017-07-13 DIAGNOSIS — I5022 Chronic systolic (congestive) heart failure: Secondary | ICD-10-CM | POA: Diagnosis not present

## 2017-07-13 DIAGNOSIS — E1142 Type 2 diabetes mellitus with diabetic polyneuropathy: Secondary | ICD-10-CM | POA: Diagnosis not present

## 2017-07-13 DIAGNOSIS — R531 Weakness: Secondary | ICD-10-CM | POA: Diagnosis not present

## 2017-07-13 DIAGNOSIS — I130 Hypertensive heart and chronic kidney disease with heart failure and stage 1 through stage 4 chronic kidney disease, or unspecified chronic kidney disease: Secondary | ICD-10-CM | POA: Diagnosis not present

## 2017-07-13 DIAGNOSIS — I2510 Atherosclerotic heart disease of native coronary artery without angina pectoris: Secondary | ICD-10-CM | POA: Diagnosis not present

## 2017-07-13 DIAGNOSIS — E1122 Type 2 diabetes mellitus with diabetic chronic kidney disease: Secondary | ICD-10-CM | POA: Diagnosis not present

## 2017-07-13 DIAGNOSIS — J449 Chronic obstructive pulmonary disease, unspecified: Secondary | ICD-10-CM | POA: Diagnosis not present

## 2017-07-13 DIAGNOSIS — Z794 Long term (current) use of insulin: Secondary | ICD-10-CM | POA: Diagnosis not present

## 2017-07-13 DIAGNOSIS — R269 Unspecified abnormalities of gait and mobility: Secondary | ICD-10-CM | POA: Diagnosis not present

## 2017-07-13 DIAGNOSIS — M1991 Primary osteoarthritis, unspecified site: Secondary | ICD-10-CM | POA: Diagnosis not present

## 2017-07-13 DIAGNOSIS — N183 Chronic kidney disease, stage 3 (moderate): Secondary | ICD-10-CM | POA: Diagnosis not present

## 2017-07-13 DIAGNOSIS — I4891 Unspecified atrial fibrillation: Secondary | ICD-10-CM | POA: Diagnosis not present

## 2017-07-13 DIAGNOSIS — Z9181 History of falling: Secondary | ICD-10-CM | POA: Diagnosis not present

## 2017-07-13 DIAGNOSIS — Z950 Presence of cardiac pacemaker: Secondary | ICD-10-CM | POA: Diagnosis not present

## 2017-07-15 ENCOUNTER — Encounter: Payer: Self-pay | Admitting: Family Medicine

## 2017-07-15 ENCOUNTER — Ambulatory Visit (INDEPENDENT_AMBULATORY_CARE_PROVIDER_SITE_OTHER): Payer: Medicare Other | Admitting: Family Medicine

## 2017-07-15 DIAGNOSIS — I5032 Chronic diastolic (congestive) heart failure: Secondary | ICD-10-CM | POA: Diagnosis not present

## 2017-07-15 DIAGNOSIS — I130 Hypertensive heart and chronic kidney disease with heart failure and stage 1 through stage 4 chronic kidney disease, or unspecified chronic kidney disease: Secondary | ICD-10-CM | POA: Diagnosis not present

## 2017-07-15 DIAGNOSIS — E1122 Type 2 diabetes mellitus with diabetic chronic kidney disease: Secondary | ICD-10-CM | POA: Diagnosis not present

## 2017-07-15 DIAGNOSIS — N183 Chronic kidney disease, stage 3 (moderate): Secondary | ICD-10-CM | POA: Diagnosis not present

## 2017-07-15 DIAGNOSIS — I2510 Atherosclerotic heart disease of native coronary artery without angina pectoris: Secondary | ICD-10-CM | POA: Diagnosis not present

## 2017-07-15 DIAGNOSIS — N390 Urinary tract infection, site not specified: Secondary | ICD-10-CM | POA: Diagnosis not present

## 2017-07-15 DIAGNOSIS — Z794 Long term (current) use of insulin: Secondary | ICD-10-CM | POA: Diagnosis not present

## 2017-07-15 DIAGNOSIS — M1991 Primary osteoarthritis, unspecified site: Secondary | ICD-10-CM | POA: Diagnosis not present

## 2017-07-15 DIAGNOSIS — J449 Chronic obstructive pulmonary disease, unspecified: Secondary | ICD-10-CM | POA: Diagnosis not present

## 2017-07-15 DIAGNOSIS — I4891 Unspecified atrial fibrillation: Secondary | ICD-10-CM | POA: Diagnosis not present

## 2017-07-15 DIAGNOSIS — Z950 Presence of cardiac pacemaker: Secondary | ICD-10-CM | POA: Diagnosis not present

## 2017-07-15 DIAGNOSIS — Z9181 History of falling: Secondary | ICD-10-CM | POA: Diagnosis not present

## 2017-07-15 DIAGNOSIS — R269 Unspecified abnormalities of gait and mobility: Secondary | ICD-10-CM | POA: Diagnosis not present

## 2017-07-15 DIAGNOSIS — R531 Weakness: Secondary | ICD-10-CM | POA: Diagnosis not present

## 2017-07-15 DIAGNOSIS — E1142 Type 2 diabetes mellitus with diabetic polyneuropathy: Secondary | ICD-10-CM | POA: Diagnosis not present

## 2017-07-15 DIAGNOSIS — I5022 Chronic systolic (congestive) heart failure: Secondary | ICD-10-CM | POA: Diagnosis not present

## 2017-07-15 LAB — BASIC METABOLIC PANEL
BUN/Creatinine Ratio: 14 (calc) (ref 6–22)
BUN: 32 mg/dL — ABNORMAL HIGH (ref 7–25)
CALCIUM: 8.7 mg/dL (ref 8.6–10.4)
CHLORIDE: 102 mmol/L (ref 98–110)
CO2: 26 mmol/L (ref 20–32)
CREATININE: 2.34 mg/dL — AB (ref 0.60–0.88)
Glucose, Bld: 245 mg/dL — ABNORMAL HIGH (ref 65–99)
Potassium: 5.1 mmol/L (ref 3.5–5.3)
Sodium: 139 mmol/L (ref 135–146)

## 2017-07-15 NOTE — Progress Notes (Signed)
Hospital follow-up. Admitted with UTI. She had been previously seen in the office but her condition worsened and she required ER evaluation and admission. Her mentation is clearly improved in the meantime. She is deconditioned overall. She has a history of heart failure at baseline. Her kidney function and weight are both greatly influenced by diuretic use. She is still at home with her daughter. She has cardiology follow-up pending. She is due for labs today.  She's had multiple urinary tract infections and she insulin getting deconditioned and/or debilitated with each episode. We talked about options going forward.  She doesn't have fevers or chest pain now. She has some lower extremity edema. She is not short of breath at the office visit today. No dysuria now. Still on antibiotics.  PMH and SH reviewed  ROS: Per HPI unless specifically indicated in ROS section   Meds, vitals, and allergies reviewed.   GEN: nad, alert and oriented, in wheelchair. Her affect is brighter today.  She can participate in the conversation. She is oriented. HEENT: mucous membranes moist NECK: supple w/o LA CV: rrr. PULM: ctab, no inc wob ABD: soft, +bs EXT: trace BLE edema SKIN: Senile purpura noted on the bilateral upper arms

## 2017-07-15 NOTE — Patient Instructions (Signed)
Don't change your meds for now.  Let me see about options for preventive antibiotics for urinary tract infections.  Take care.  Glad to see you.

## 2017-07-15 NOTE — Addendum Note (Signed)
Addended by: Felix Ahmadi A on: 07/15/2017 03:12 PM   Modules accepted: Orders

## 2017-07-16 ENCOUNTER — Other Ambulatory Visit (HOSPITAL_COMMUNITY): Payer: Self-pay | Admitting: Cardiology

## 2017-07-17 ENCOUNTER — Encounter: Payer: Self-pay | Admitting: Family Medicine

## 2017-07-17 NOTE — Assessment & Plan Note (Signed)
Status post admission. Improved now. No need to check urine culture this point since she is still on antibiotics. We talked about prophylactic antibiotics. It might make sense for patient for a relatively short period of time with prophylactic antibiotics. Rationale discussed with patient and daughter. I want to make sure cardiology is aware and okay with this.  Recheck labs today are pending. At this point still okay for outpatient follow-up. All questions answered. >25 minutes spent in face to face time with patient, >50% spent in counselling or coordination of care.

## 2017-07-18 ENCOUNTER — Other Ambulatory Visit (HOSPITAL_COMMUNITY): Payer: Self-pay

## 2017-07-18 ENCOUNTER — Encounter (HOSPITAL_COMMUNITY)
Admission: RE | Disposition: A | Discharge status: Home / Self Care | Payer: Self-pay | Source: Ambulatory Visit | Attending: Cardiology

## 2017-07-18 ENCOUNTER — Ambulatory Visit (HOSPITAL_COMMUNITY)
Admission: RE | Admit: 2017-07-18 | Discharge: 2017-07-18 | Disposition: A | Discharge status: Home / Self Care | Payer: Medicare Other | Source: Ambulatory Visit | Attending: Cardiology | Admitting: Cardiology

## 2017-07-18 ENCOUNTER — Telehealth (HOSPITAL_COMMUNITY): Payer: Self-pay | Admitting: *Deleted

## 2017-07-18 DIAGNOSIS — I130 Hypertensive heart and chronic kidney disease with heart failure and stage 1 through stage 4 chronic kidney disease, or unspecified chronic kidney disease: Secondary | ICD-10-CM | POA: Insufficient documentation

## 2017-07-18 DIAGNOSIS — Z7901 Long term (current) use of anticoagulants: Secondary | ICD-10-CM | POA: Diagnosis not present

## 2017-07-18 DIAGNOSIS — I6529 Occlusion and stenosis of unspecified carotid artery: Secondary | ICD-10-CM | POA: Insufficient documentation

## 2017-07-18 DIAGNOSIS — Z950 Presence of cardiac pacemaker: Secondary | ICD-10-CM | POA: Insufficient documentation

## 2017-07-18 DIAGNOSIS — E1142 Type 2 diabetes mellitus with diabetic polyneuropathy: Secondary | ICD-10-CM | POA: Diagnosis not present

## 2017-07-18 DIAGNOSIS — Z794 Long term (current) use of insulin: Secondary | ICD-10-CM | POA: Insufficient documentation

## 2017-07-18 DIAGNOSIS — R001 Bradycardia, unspecified: Secondary | ICD-10-CM | POA: Diagnosis not present

## 2017-07-18 DIAGNOSIS — F329 Major depressive disorder, single episode, unspecified: Secondary | ICD-10-CM | POA: Insufficient documentation

## 2017-07-18 DIAGNOSIS — I4891 Unspecified atrial fibrillation: Secondary | ICD-10-CM | POA: Diagnosis not present

## 2017-07-18 DIAGNOSIS — E785 Hyperlipidemia, unspecified: Secondary | ICD-10-CM | POA: Diagnosis not present

## 2017-07-18 DIAGNOSIS — I5022 Chronic systolic (congestive) heart failure: Secondary | ICD-10-CM

## 2017-07-18 DIAGNOSIS — I2510 Atherosclerotic heart disease of native coronary artery without angina pectoris: Secondary | ICD-10-CM | POA: Insufficient documentation

## 2017-07-18 DIAGNOSIS — I509 Heart failure, unspecified: Secondary | ICD-10-CM | POA: Diagnosis not present

## 2017-07-18 DIAGNOSIS — N183 Chronic kidney disease, stage 3 (moderate): Secondary | ICD-10-CM | POA: Diagnosis not present

## 2017-07-18 DIAGNOSIS — I5032 Chronic diastolic (congestive) heart failure: Secondary | ICD-10-CM | POA: Insufficient documentation

## 2017-07-18 DIAGNOSIS — J449 Chronic obstructive pulmonary disease, unspecified: Secondary | ICD-10-CM | POA: Diagnosis not present

## 2017-07-18 DIAGNOSIS — I350 Nonrheumatic aortic (valve) stenosis: Secondary | ICD-10-CM | POA: Insufficient documentation

## 2017-07-18 DIAGNOSIS — R269 Unspecified abnormalities of gait and mobility: Secondary | ICD-10-CM | POA: Diagnosis not present

## 2017-07-18 DIAGNOSIS — E1122 Type 2 diabetes mellitus with diabetic chronic kidney disease: Secondary | ICD-10-CM | POA: Diagnosis not present

## 2017-07-18 DIAGNOSIS — I480 Paroxysmal atrial fibrillation: Secondary | ICD-10-CM | POA: Insufficient documentation

## 2017-07-18 DIAGNOSIS — M1991 Primary osteoarthritis, unspecified site: Secondary | ICD-10-CM | POA: Diagnosis not present

## 2017-07-18 DIAGNOSIS — Z9181 History of falling: Secondary | ICD-10-CM | POA: Diagnosis not present

## 2017-07-18 DIAGNOSIS — R531 Weakness: Secondary | ICD-10-CM | POA: Diagnosis not present

## 2017-07-18 DIAGNOSIS — Z79899 Other long term (current) drug therapy: Secondary | ICD-10-CM | POA: Insufficient documentation

## 2017-07-18 LAB — POCT I-STAT 3, VENOUS BLOOD GAS (G3P V)
ACID-BASE EXCESS: 4 mmol/L — AB (ref 0.0–2.0)
ACID-BASE EXCESS: 5 mmol/L — AB (ref 0.0–2.0)
ACID-BASE EXCESS: 5 mmol/L — AB (ref 0.0–2.0)
BICARBONATE: 28.6 mmol/L — AB (ref 20.0–28.0)
BICARBONATE: 30 mmol/L — AB (ref 20.0–28.0)
Bicarbonate: 30.1 mmol/L — ABNORMAL HIGH (ref 20.0–28.0)
O2 SAT: 70 %
O2 SAT: 75 %
O2 Saturation: 75 %
PH VEN: 7.412 (ref 7.250–7.430)
PO2 VEN: 40 mmHg (ref 32.0–45.0)
PO2 VEN: 40 mmHg (ref 32.0–45.0)
TCO2: 30 mmol/L (ref 22–32)
TCO2: 31 mmol/L (ref 22–32)
TCO2: 32 mmol/L (ref 22–32)
pCO2, Ven: 44.6 mmHg (ref 44.0–60.0)
pCO2, Ven: 47.2 mmHg (ref 44.0–60.0)
pCO2, Ven: 47.2 mmHg (ref 44.0–60.0)
pH, Ven: 7.411 (ref 7.250–7.430)
pH, Ven: 7.415 (ref 7.250–7.430)
pO2, Ven: 36 mmHg (ref 32.0–45.0)

## 2017-07-18 LAB — GLUCOSE, CAPILLARY
GLUCOSE-CAPILLARY: 71 mg/dL (ref 65–99)
Glucose-Capillary: 94 mg/dL (ref 65–99)
Glucose-Capillary: 118 mg/dL — ABNORMAL HIGH (ref 65–99)

## 2017-07-18 SURGERY — PRESSURE SENSOR/CARDIOMEMS
Anesthesia: LOCAL

## 2017-07-18 MED ORDER — LIDOCAINE HCL (PF) 1 % IJ SOLN
INTRAMUSCULAR | Status: DC | PRN
  Administered 2017-07-18: 20 mL

## 2017-07-18 MED ORDER — SODIUM CHLORIDE 0.9% FLUSH: 3.0000 mL | Freq: Two times a day (BID) | INTRAVENOUS | Status: DC

## 2017-07-18 MED ORDER — HEPARIN (PORCINE) IN NACL 2-0.9 UNIT/ML-% IJ SOLN
INTRAMUSCULAR | Status: AC | PRN
  Administered 2017-07-18: 1000 mL
  Administered 2017-07-18: 500 mL

## 2017-07-18 MED ORDER — SODIUM CHLORIDE 0.9% FLUSH: 3.0000 mL | INTRAVENOUS | Status: DC | PRN

## 2017-07-18 MED ORDER — SODIUM CHLORIDE 0.9 % IV SOLN
INTRAVENOUS | Status: DC
  Administered 2017-07-18: 09:00:00 via INTRAVENOUS

## 2017-07-18 MED ORDER — ACETAMINOPHEN 325 MG PO TABS: 650.0000 mg | ORAL_TABLET | ORAL | Status: DC | PRN

## 2017-07-18 MED ORDER — HEPARIN (PORCINE) IN NACL 2-0.9 UNIT/ML-% IJ SOLN
INTRAMUSCULAR | Status: AC
  Filled 2017-07-18: qty 1000

## 2017-07-18 MED ORDER — HEPARIN (PORCINE) IN NACL 2-0.9 UNIT/ML-% IJ SOLN
INTRAMUSCULAR | Status: AC
  Filled 2017-07-18: qty 500

## 2017-07-18 MED ORDER — MIDAZOLAM HCL 2 MG/2ML IJ SOLN
INTRAMUSCULAR | Status: DC | PRN
  Administered 2017-07-18 (×2): 1 mg via INTRAVENOUS

## 2017-07-18 MED ORDER — SODIUM CHLORIDE 0.9 % IV SOLN: 250.0000 mL | INTRAVENOUS | Status: DC | PRN

## 2017-07-18 MED ORDER — FENTANYL CITRATE (PF) 100 MCG/2ML IJ SOLN
INTRAMUSCULAR | Status: AC
  Filled 2017-07-18: qty 2

## 2017-07-18 MED ORDER — FENTANYL CITRATE (PF) 100 MCG/2ML IJ SOLN
INTRAMUSCULAR | Status: DC | PRN
  Administered 2017-07-18 (×2): 25 ug via INTRAVENOUS

## 2017-07-18 MED ORDER — ASPIRIN 81 MG PO CHEW: 81.0000 mg | CHEWABLE_TABLET | ORAL | Status: DC

## 2017-07-18 MED ORDER — ONDANSETRON HCL 4 MG/2ML IJ SOLN
4.0000 mg | Freq: Four times a day (QID) | INTRAMUSCULAR | Status: DC | PRN
Start: 2017-07-18 — End: 2017-07-18

## 2017-07-18 MED ORDER — LIDOCAINE HCL 2 % IJ SOLN
INTRAMUSCULAR | Status: AC
  Filled 2017-07-18: qty 10

## 2017-07-18 MED ORDER — MIDAZOLAM HCL 2 MG/2ML IJ SOLN
INTRAMUSCULAR | Status: AC
  Filled 2017-07-18: qty 2

## 2017-07-18 MED ORDER — IOPAMIDOL (ISOVUE-370) INJECTION 76%
INTRAVENOUS | Status: AC
  Filled 2017-07-18: qty 50

## 2017-07-18 MED ORDER — IOPAMIDOL (ISOVUE-370) INJECTION 76%
INTRAVENOUS | Status: DC | PRN
  Administered 2017-07-18: 10 mL via INTRAVENOUS

## 2017-07-18 SURGICAL SUPPLY — 13 items
CARDIOMEMS PA SENSOR W/DELIVER (Prosthesis & Implant Heart) ×2 IMPLANT
CATH SWAN GANZ 7F STRAIGHT (CATHETERS) ×1 IMPLANT
DILATOR VESSEL 38 20CM 7FR (INTRODUCER) ×1 IMPLANT
DILATOR VESSEL 38 20CM 9FR (VASCULAR PRODUCTS) ×1 IMPLANT
PACK CARDIAC CATHETERIZATION (CUSTOM PROCEDURE TRAY) ×1 IMPLANT
PROTECTION STATION PRESSURIZED (MISCELLANEOUS) ×2
SENSOR CARDIOMEMS PA W/DELIVER (Prosthesis & Implant Heart) IMPLANT
SHEATH FAST CATH 12F 12CM (SHEATH) ×1 IMPLANT
SHEATH PINNACLE 7F 10CM (SHEATH) ×1 IMPLANT
STATION PROTECTION PRESSURIZED (MISCELLANEOUS) IMPLANT
TUBING ART PRESS 72  MALE/FEM (TUBING) ×1
TUBING ART PRESS 72 MALE/FEM (TUBING) IMPLANT
WIRE G V18X300CM (WIRE) ×1 IMPLANT

## 2017-07-18 NOTE — Progress Notes (Addendum)
Site area: RFV Site Prior to Removal:  Level 0 Pressure Applied For: 25 min Manual:   yes Patient Status During Pull:  stable Post Pull Site:  Level 0 Post Pull Instructions Given:  yes Post Pull Pulses Present: palpable Dressing Applied:  tegaderm Bedrest begins @  1100 till 1500 Comments:

## 2017-07-18 NOTE — H&P (View-Only) (Signed)
Patient ID: Danielle Benitez, female   DOB: 1932-04-18, 83 y.o.   MRN: [id number] PCP: Dr. Damita Dunnings HF Cardiology: Dr. Aundra Dubin  83 y.o. with history of HTN, orthostatic hypotension, symptomatic bradycardia with Medtronic PPM, paroxysmal atrial fibrillation, chronic diastolic CHF, CKD stage III, and iron deficiency anemia presents for cardiology followup. Around the beginning of 9/16, she began to develop exertional dyspnea. She was seen in the cardiology office on 07/25/15. She was in atrial fibrillation with RVR and echo showed EF 35-40% (had been 50-55% in past). PPM interrogation showed that atrial fibrillation had begun 07/11/15, so exertional dyspnea had preceded this by about 4 weeks. She was admitted from 10/7-10/17. She was anemic with hemoglobin 8.7. She was heme negative but Fe deficient. EGD was not done. She had TEE-guided DCCV to NSR. She also had AKI with creatinine up to 3.41, but it trended back down. She was sent home in NSR. She was readmitted 10/19-10/23 with recurrent atrial fibrillation and acute on chronic systolic CHF. She was diuresed about 10 lbs. It was decided to rate control her rather than repeat cardioversion, and she was sent home. she was only at home for about a day and developed increased dyspnea so came back to the ER and was re-admitted.  She was diuresed with IV Lasix and started on amiodarone.  She underwent DCCV on 10/26 but atrial fibrillation recurred on 10/29.  More amiodarone was loaded, and she went back into NSR.   Lexiscan Cardiolite in 11/16 showed basal inferior/inferolatearl fixed defect consistent with infarction but no ischemia.    Repeat echo was done 1/17.  This showed EF 45% with normal RV size and mildly decreased systolic function.   She was admitted in 7/17 with suspected infection of her device.  She had her PPM extracted and got a temporary-permanent device with later Medtronic PPM replacement. Last echo in 7/18 showed EF 60-65% with mild LVH,  mildly dilated RV.  She was admitted in 7/18 and again in 8/18 with complex UTIs with mental status changes.  She was given IV fluid during these admissions.   Presents today for HF follow up. Tearful, says that she is tired of watching her fluid and salt intake. Feels like a burden to her family. Weight down 5 pounds since last visit 2 weeks ago, but she remains SOB with 81, bathing. Says she cannot do anything without becoming SOB. She says that she drinks and eats ice all day due to mouth dryness. Endorses orthopnea. Denies chest pain, palpitations.   Labs (7/16): LDL 48, HDL 55 Labs (10/16): K 3.9, creatinine 2.08 Labs (11/16): K 4.9, creatinine 2.5, LFTs normal, TSH normal, HCT 43.6 Labs (12/16): K 5.2, creatinine 1.73, HCT 41.9, plts 123 Labs (3/17): LFTs normal Labs (7/17): creatinine 1.87 Labs (8/17): LDL 57, HDL 50 Labs (9/17): K 4.9, creatinine 1.74, BNP 253 Labs (11/17): TSH normal, AST 67, ALT 37 Labs (12/17): K 4.1, creatinine 1.9, BNP 140 Labs (8/18): K 3.1, creatinine 1.7, TSH normal, hgb 9.3, AST 62  PMH: 1. HTN but also with history of orthostatic hypotension. 2. CKD stage III 3. Aortic stenosis: Mild 4. Symptomatic bradycardia s/p Medtronic PPM. - PPM infection 7/17 with device replacement.  5. Hyperlipidemia 6. Atrial fibrillation: Paroxysmal. TEE-guided DCCV in 10/16. She had DCCV again latera in 10/16 and was started on amiodarone.   - PFTs (11/16) with normal spirometry, severe diffusion defect c/w pulmonary vascular disease.  7. Type II diabetes with diabetic neuropathy.  8. Gout 9. Fe-deficiency  anemia 10. Chronic diastolic CHF: Echo 11/14 with EF 50-55%. Echo (10/16) with EF 35-40%, moderate LV dilation, diffuse hypokinesis, mild AS, moderate MR, PASP 48 mmHg.  Lexiscan Cardiolite (11/16) with EF 31%, basal inferior and inferolateral fixed defect possibly consistent with infarction, no ischemia; degree of LV hypokinesis was out of proportion to the  perfusion defect.  Echo (1/17) with EF 45%, normal RV size with mildly decreased systolic function, aortic sclerosis without stenosis.  - Echo (8/17): EF 55-60%, mild LVH, normal RV size and systolic function.  - Echo (7/18): EF 60-65%, mildly dilated RV, PASP 36 mmHg.  11. Esophageal dysmotility 12. Carotid stenosis: Carotid dopplers (9/17) with 40-59% LICA stenosis.  - carotid dopplers (7/18) with mild disease bilaterally.  13. ABIs (12/17) normal.  14. Recurrent UTIs  SH: Lives alone but daughter is nearby.  Nonsmoker. No ETOH.    FH: No premature CAD.   ROS: All systems reviewed and negative except as per HPI.    Current Outpatient Prescriptions  Medication Sig Dispense Refill  . albuterol (PROVENTIL HFA;VENTOLIN HFA) 108 (90 Base) MCG/ACT inhaler Inhale 2 puffs into the lungs every 4 (four) hours as needed for wheezing or shortness of breath (cough, shortness of breath or wheezing.). 1 Inhaler 1  . allopurinol (ZYLOPRIM) 300 MG tablet TAKE 300MG BY MOUTH DAILY 90 tablet 1  . amiodarone (PACERONE) 200 MG tablet TAKE 200MG BY MOUTH DAILY    . apixaban (ELIQUIS) 2.5 MG TABS tablet TAKE 2.5MG BY MOUTH TWICE A DAY    . atorvastatin (LIPITOR) 20 MG tablet Take 1 tablet (20 mg total) by mouth daily at 6 PM. 30 tablet 0  . Blood Glucose Monitoring Suppl (ONE TOUCH ULTRA 2) w/Device KIT Check blood sugar twice a day and as directed Dx E11.21 1 each 0  . DULoxetine (CYMBALTA) 20 MG capsule TAKE 20MG BY MOUTH EVERY NIGHT AT BEDTIME    . fluticasone (FLONASE) 50 MCG/ACT nasal spray Place 1 spray into both nostrils daily as needed for allergies or rhinitis.    . GAS RELIEF 80 MG chewable tablet CHEW ONE TABLET BY MOUTH EVERY 6 HOURS AS NEEDED FOR GAS. 30 tablet 5  . glimepiride (AMARYL) 1 MG tablet TAKE 1MG BY MOUTH EVERY MORNING WITH BREAKFAST 90 tablet 1  . glucose blood (ONE TOUCH ULTRA TEST) test strip CHECK BLOOD SUGAR TWICE A DAY AND AS DIRECTED;DX E11.21 100 each 5  . Insulin Glargine  (LANTUS SOLOSTAR) 100 UNIT/ML Solostar Pen Inject 16 Units into the skin See admin instructions. Daily at 10pm. If blood sugar 100-150 give same units as last dose, if greater than 151 increase by 1 unit.    . Insulin Pen Needle (PEN NEEDLES) 30G X 5 MM MISC Inject 1 Dose as directed daily. Use with insulin pen 100 each 3  . loratadine (CLARITIN CHILDRENS) 5 MG chewable tablet Chew 1 tablet (5 mg total) by mouth daily.    . metoprolol succinate (TOPROL-XL) 25 MG 24 hr tablet Take 1 tablet (25 mg total) by mouth 2 (two) times daily. 60 tablet 0  . Multiple Vitamins-Minerals (ONE-A-DAY 50 PLUS PO) Take 1 capsule by mouth daily.    . pantoprazole (PROTONIX) 40 MG tablet TAKE 40MG BY MOUTH DAILY    . potassium chloride SA (KLOR-CON M20) 20 MEQ tablet Take 2 tablets (40 mEq total) by mouth daily. 60 tablet 3  . senna-docusate (SENOKOT-S) 8.6-50 MG tablet Take 1 tablet by mouth daily as needed for mild constipation.    .   torsemide (DEMADEX) 20 MG tablet Take 4 tablets (80 mg total) by mouth daily. 120 tablet 3  . traZODone (DESYREL) 50 MG tablet Take 0.5-1 tablets (25-50 mg total) by mouth at bedtime as needed for sleep. Don't take >78m in a day. 30 tablet 3   No current facility-administered medications for this encounter.    BP (!) 150/88 (BP Location: Left Arm, Patient Position: Sitting, Cuff Size: Normal)   Pulse 77   Wt 201 lb (91.2 kg)   SpO2 98%   BMI 37.98 kg/m  General: Elderly, fatigued appearing. No resp difficulty. HEENT: Normal Neck: Supple. JVP to jaw. Carotids 2+ bilat; no bruits. No thyromegaly or nodule noted. Cor: PMI nondisplaced. RRR, No M/G/R noted Lungs: CTAB, normal effort. Abdomen: Soft, non-tender, non-distended, no HSM. No bruits or masses. +BS  Extremities: No cyanosis, clubbing, rash. 2+ edema to knees.  Neuro: Alert & orientedx3, cranial nerves grossly intact. moves all 4 extremities w/o difficulty. Affect tearful    Assessment/Plan: 1. Chronic diastolic CHF:  EF 616-07% has been as low as 35% in the past. Etiology uncertain: could be tachy-mediated cardiomyopathy, but symptoms seemed to predate the onset of atrial fibrillation. Possible viral myocarditis. Cannot rule out ischemic cardiomyopathy, but no definite anginal symptoms. Cardiolite in 11/16 showed a small area possibly of prior infarction but EF and wall motion abnormalities were out of proportion to the perfusion defect.  - NYHA III - Volume elevated on exam. Increase torsemide to 80 mg BID for 3 days, also increase KCl to 463m BID for 3 days. Labs next week.  - Talked with her about reducing ice intake and fluid intake. She is tearful about this and says that she has to have it. I explained that we are increasing her torsemide to remove excess fluid. She will try to reduce ice.  - Continue Toprol XL 2533mID - Our office is working on [employer] 2. Atrial fibrillation: NSR today.  - Continue Eliquis - Toprol XL 25 mg bid.  - Continue Amio. Recent TSH and LFT's stable. Reminded her that she will need regular eye exams.   3. CKD Stage III:  - Check BMET next week.   4. Medtronic PPM for symptomatic bradycardia.   - Stable.   5. CAD: Possible old infarction on Cardiolite. Per Dr. McLAundra Dubin Would not cath with significant CKD and no chest pain.   - Continue statin.  - No ASA with Eliquis.   6. Carotid stenosis: Mild on 7/18 dopplers.   7. HTN:  - Well controlled on current regimen.    8. Diabetes: Follows with PCP.   9. Depression: Asked that she call her PCP. She is tearful, anxious may need to increase Cymbalta or switch to a different agent.   Follow up in 2 weeks. BMET, BNP today  EriArbutus Leas11/2018

## 2017-07-18 NOTE — Discharge Instructions (Signed)
RESTART ELIQUIS AT 10PM TONIGHT  Femoral Site Care Refer to this sheet in the next few weeks. These instructions provide you with information about caring for yourself after your procedure. Your health care provider may also give you more specific instructions. Your treatment has been planned according to current medical practices, but problems sometimes occur. Call your health care provider if you have any problems or questions after your procedure. What can I expect after the procedure? After your procedure, it is typical to have the following:  Bruising at the site that usually fades within 1-2 weeks.  Blood collecting in the tissue (hematoma) that may be painful to the touch. It should usually decrease in size and tenderness within 1-2 weeks.  Follow these instructions at home:  Take medicines only as directed by your health care provider.  You may shower 24-48 hours after the procedure or as directed by your health care provider. Remove the bandage (dressing) and gently wash the site with plain soap and water. Pat the area dry with a clean towel. Do not rub the site, because this may cause bleeding.  Do not take baths, swim, or use a hot tub until your health care provider approves.  Check your insertion site every day for redness, swelling, or drainage.  Do not apply powder or lotion to the site.  Limit use of stairs to twice a day for the first 2-3 days or as directed by your health care provider.  Do not squat for the first 2-3 days or as directed by your health care provider.  Do not lift over 10 lb (4.5 kg) for 5 days after your procedure or as directed by your health care provider.  Ask your health care provider when it is okay to: ? Return to work or school. ? Resume usual physical activities or sports. ? Resume sexual activity.  Do not drive home if you are discharged the same day as the procedure. Have someone else drive you.  You may drive 24 hours after the  procedure unless otherwise instructed by your health care provider.  Do not operate machinery or power tools for 24 hours after the procedure or as directed by your health care provider.  If your procedure was done as an outpatient procedure, which means that you went home the same day as your procedure, a responsible adult should be with you for the first 24 hours after you arrive home.  Keep all follow-up visits as directed by your health care provider. This is important. Contact a health care provider if:  You have a fever.  You have chills.  You have increased bleeding from the site. Hold pressure on the site. Get help right away if:  You have unusual pain at the site.  You have redness, warmth, or swelling at the site.  You have drainage (other than a small amount of blood on the dressing) from the site.  The site is bleeding, and the bleeding does not stop after 30 minutes of holding steady pressure on the site.  Your leg or foot becomes pale, cool, tingly, or numb. This information is not intended to replace advice given to you by your health care provider. Make sure you discuss any questions you have with your health care provider. Document Released: 05/31/2014 Document Revised: 03/04/2016 Document Reviewed: 04/16/2014 Elsevier Interactive Patient Education  [employer].

## 2017-07-18 NOTE — Telephone Encounter (Signed)
-----   Message from Laurey Morale, MD sent at 07/17/2017 10:38 PM EDT ----- Stop KCl, repeat BMET in 1 week.

## 2017-07-18 NOTE — Progress Notes (Addendum)
CBG 71. Asymptomatic. 8 oz orange juice po. Repeat CBG at 1105 - 118. Will transport to short stay.

## 2017-07-18 NOTE — Interval H&P Note (Signed)
History and Physical Interval Note:  07/18/2017 9:14 AM  Danielle Benitez  has presented today for surgery, with the diagnosis of CHF  The various methods of treatment have been discussed with the patient and family. After consideration of risks, benefits and other options for treatment, the patient has consented to  Procedure(s): PRESSURE SENSOR/CARDIOMEMS (N/A) as a surgical intervention .  The patient's history has been reviewed, patient examined, no change in status, stable for surgery.  I have reviewed the patient's chart and labs.  Questions were answered to the patient's satisfaction.     Dalton [employer]

## 2017-07-19 ENCOUNTER — Telehealth: Payer: Self-pay

## 2017-07-19 MED FILL — Lidocaine HCl Local Inj 2%: INTRAMUSCULAR | Qty: 10 | Status: AC

## 2017-07-19 NOTE — Telephone Encounter (Signed)
Vernona Rieger with [facility] left v/m requesting verbal orders for [facility] PT to be held this week due to pt having a procedure and then continue HH PT 2 x a week for 4 weeks, starting next wk.

## 2017-07-20 DIAGNOSIS — I130 Hypertensive heart and chronic kidney disease with heart failure and stage 1 through stage 4 chronic kidney disease, or unspecified chronic kidney disease: Secondary | ICD-10-CM | POA: Diagnosis not present

## 2017-07-20 DIAGNOSIS — M1991 Primary osteoarthritis, unspecified site: Secondary | ICD-10-CM | POA: Diagnosis not present

## 2017-07-20 DIAGNOSIS — Z950 Presence of cardiac pacemaker: Secondary | ICD-10-CM | POA: Diagnosis not present

## 2017-07-20 DIAGNOSIS — R269 Unspecified abnormalities of gait and mobility: Secondary | ICD-10-CM | POA: Diagnosis not present

## 2017-07-20 DIAGNOSIS — I4891 Unspecified atrial fibrillation: Secondary | ICD-10-CM | POA: Diagnosis not present

## 2017-07-20 DIAGNOSIS — E1122 Type 2 diabetes mellitus with diabetic chronic kidney disease: Secondary | ICD-10-CM | POA: Diagnosis not present

## 2017-07-20 DIAGNOSIS — R531 Weakness: Secondary | ICD-10-CM | POA: Diagnosis not present

## 2017-07-20 DIAGNOSIS — N183 Chronic kidney disease, stage 3 (moderate): Secondary | ICD-10-CM | POA: Diagnosis not present

## 2017-07-20 DIAGNOSIS — I2510 Atherosclerotic heart disease of native coronary artery without angina pectoris: Secondary | ICD-10-CM | POA: Diagnosis not present

## 2017-07-20 DIAGNOSIS — Z794 Long term (current) use of insulin: Secondary | ICD-10-CM | POA: Diagnosis not present

## 2017-07-20 DIAGNOSIS — J449 Chronic obstructive pulmonary disease, unspecified: Secondary | ICD-10-CM | POA: Diagnosis not present

## 2017-07-20 DIAGNOSIS — Z9181 History of falling: Secondary | ICD-10-CM | POA: Diagnosis not present

## 2017-07-20 DIAGNOSIS — E1142 Type 2 diabetes mellitus with diabetic polyneuropathy: Secondary | ICD-10-CM | POA: Diagnosis not present

## 2017-07-20 DIAGNOSIS — I5022 Chronic systolic (congestive) heart failure: Secondary | ICD-10-CM | POA: Diagnosis not present

## 2017-07-20 NOTE — Telephone Encounter (Signed)
Please give the order.  Thanks.   

## 2017-07-20 NOTE — Telephone Encounter (Signed)
Verbal order given to Laura by telephone as instructed. 

## 2017-07-21 ENCOUNTER — Other Ambulatory Visit: Payer: Medicare Other

## 2017-07-21 ENCOUNTER — Other Ambulatory Visit: Payer: Self-pay | Admitting: Family Medicine

## 2017-07-21 DIAGNOSIS — M1991 Primary osteoarthritis, unspecified site: Secondary | ICD-10-CM | POA: Diagnosis not present

## 2017-07-21 DIAGNOSIS — Z950 Presence of cardiac pacemaker: Secondary | ICD-10-CM | POA: Diagnosis not present

## 2017-07-21 DIAGNOSIS — I130 Hypertensive heart and chronic kidney disease with heart failure and stage 1 through stage 4 chronic kidney disease, or unspecified chronic kidney disease: Secondary | ICD-10-CM | POA: Diagnosis not present

## 2017-07-21 DIAGNOSIS — E1122 Type 2 diabetes mellitus with diabetic chronic kidney disease: Secondary | ICD-10-CM | POA: Diagnosis not present

## 2017-07-21 DIAGNOSIS — I5022 Chronic systolic (congestive) heart failure: Secondary | ICD-10-CM | POA: Diagnosis not present

## 2017-07-21 DIAGNOSIS — N183 Chronic kidney disease, stage 3 (moderate): Secondary | ICD-10-CM | POA: Diagnosis not present

## 2017-07-21 DIAGNOSIS — Z9181 History of falling: Secondary | ICD-10-CM | POA: Diagnosis not present

## 2017-07-21 DIAGNOSIS — R269 Unspecified abnormalities of gait and mobility: Secondary | ICD-10-CM | POA: Diagnosis not present

## 2017-07-21 DIAGNOSIS — I4891 Unspecified atrial fibrillation: Secondary | ICD-10-CM | POA: Diagnosis not present

## 2017-07-21 DIAGNOSIS — J449 Chronic obstructive pulmonary disease, unspecified: Secondary | ICD-10-CM | POA: Diagnosis not present

## 2017-07-21 DIAGNOSIS — I2510 Atherosclerotic heart disease of native coronary artery without angina pectoris: Secondary | ICD-10-CM | POA: Diagnosis not present

## 2017-07-21 DIAGNOSIS — E1142 Type 2 diabetes mellitus with diabetic polyneuropathy: Secondary | ICD-10-CM | POA: Diagnosis not present

## 2017-07-21 DIAGNOSIS — R531 Weakness: Secondary | ICD-10-CM | POA: Diagnosis not present

## 2017-07-21 DIAGNOSIS — Z794 Long term (current) use of insulin: Secondary | ICD-10-CM | POA: Diagnosis not present

## 2017-07-21 MED ORDER — CEPHALEXIN 250 MG PO CAPS: 250.0000 mg | ORAL_CAPSULE | Freq: Every day | ORAL | 1 refills | Status: DC

## 2017-07-21 NOTE — Progress Notes (Signed)
Call [name].  I talked to cardiology.  Reasonable for trial of UTI proph with keflex.  rx sent. 1 tab a day for 2 months (30 days with 1 rf).  Goal to prevent UTI.  Update me as needed.  Thanks.   [name] advised.

## 2017-07-22 ENCOUNTER — Other Ambulatory Visit: Payer: Medicare Other

## 2017-07-22 ENCOUNTER — Other Ambulatory Visit (HOSPITAL_COMMUNITY): Payer: Medicare Other

## 2017-07-22 DIAGNOSIS — E1142 Type 2 diabetes mellitus with diabetic polyneuropathy: Secondary | ICD-10-CM | POA: Diagnosis not present

## 2017-07-22 DIAGNOSIS — R531 Weakness: Secondary | ICD-10-CM | POA: Diagnosis not present

## 2017-07-22 DIAGNOSIS — N183 Chronic kidney disease, stage 3 (moderate): Secondary | ICD-10-CM | POA: Diagnosis not present

## 2017-07-22 DIAGNOSIS — I130 Hypertensive heart and chronic kidney disease with heart failure and stage 1 through stage 4 chronic kidney disease, or unspecified chronic kidney disease: Secondary | ICD-10-CM | POA: Diagnosis not present

## 2017-07-22 DIAGNOSIS — Z950 Presence of cardiac pacemaker: Secondary | ICD-10-CM | POA: Diagnosis not present

## 2017-07-22 DIAGNOSIS — R269 Unspecified abnormalities of gait and mobility: Secondary | ICD-10-CM | POA: Diagnosis not present

## 2017-07-22 DIAGNOSIS — I5022 Chronic systolic (congestive) heart failure: Secondary | ICD-10-CM | POA: Diagnosis not present

## 2017-07-22 DIAGNOSIS — I4891 Unspecified atrial fibrillation: Secondary | ICD-10-CM | POA: Diagnosis not present

## 2017-07-22 DIAGNOSIS — Z9181 History of falling: Secondary | ICD-10-CM | POA: Diagnosis not present

## 2017-07-22 DIAGNOSIS — I2510 Atherosclerotic heart disease of native coronary artery without angina pectoris: Secondary | ICD-10-CM | POA: Diagnosis not present

## 2017-07-22 DIAGNOSIS — Z794 Long term (current) use of insulin: Secondary | ICD-10-CM | POA: Diagnosis not present

## 2017-07-22 DIAGNOSIS — E1122 Type 2 diabetes mellitus with diabetic chronic kidney disease: Secondary | ICD-10-CM | POA: Diagnosis not present

## 2017-07-22 DIAGNOSIS — J449 Chronic obstructive pulmonary disease, unspecified: Secondary | ICD-10-CM | POA: Diagnosis not present

## 2017-07-22 DIAGNOSIS — M1991 Primary osteoarthritis, unspecified site: Secondary | ICD-10-CM | POA: Diagnosis not present

## 2017-07-25 ENCOUNTER — Other Ambulatory Visit (INDEPENDENT_AMBULATORY_CARE_PROVIDER_SITE_OTHER): Payer: Medicare Other

## 2017-07-25 DIAGNOSIS — I5022 Chronic systolic (congestive) heart failure: Secondary | ICD-10-CM

## 2017-07-25 LAB — BASIC METABOLIC PANEL
BUN: 37 mg/dL — AB (ref 6–23)
CALCIUM: 8.7 mg/dL (ref 8.4–10.5)
CO2: 31 mEq/L (ref 19–32)
CREATININE: 2.36 mg/dL — AB (ref 0.40–1.20)
Chloride: 101 mEq/L (ref 96–112)
GFR: 20.86 mL/min — AB (ref >60.00)
GLUCOSE: 124 mg/dL — AB (ref 70–99)
POTASSIUM: 3.5 meq/L (ref 3.5–5.1)
SODIUM: 141 meq/L (ref 135–145)

## 2017-07-26 ENCOUNTER — Ambulatory Visit (INDEPENDENT_AMBULATORY_CARE_PROVIDER_SITE_OTHER): Payer: Medicare Other | Admitting: Family Medicine

## 2017-07-26 ENCOUNTER — Encounter: Payer: Self-pay | Admitting: Family Medicine

## 2017-07-26 DIAGNOSIS — I2510 Atherosclerotic heart disease of native coronary artery without angina pectoris: Secondary | ICD-10-CM | POA: Diagnosis not present

## 2017-07-26 DIAGNOSIS — Z794 Long term (current) use of insulin: Secondary | ICD-10-CM | POA: Diagnosis not present

## 2017-07-26 DIAGNOSIS — R269 Unspecified abnormalities of gait and mobility: Secondary | ICD-10-CM | POA: Diagnosis not present

## 2017-07-26 DIAGNOSIS — Z950 Presence of cardiac pacemaker: Secondary | ICD-10-CM | POA: Diagnosis not present

## 2017-07-26 DIAGNOSIS — R05 Cough: Secondary | ICD-10-CM | POA: Diagnosis not present

## 2017-07-26 DIAGNOSIS — R531 Weakness: Secondary | ICD-10-CM | POA: Diagnosis not present

## 2017-07-26 DIAGNOSIS — Z9181 History of falling: Secondary | ICD-10-CM | POA: Diagnosis not present

## 2017-07-26 DIAGNOSIS — E1122 Type 2 diabetes mellitus with diabetic chronic kidney disease: Secondary | ICD-10-CM | POA: Diagnosis not present

## 2017-07-26 DIAGNOSIS — M1991 Primary osteoarthritis, unspecified site: Secondary | ICD-10-CM | POA: Diagnosis not present

## 2017-07-26 DIAGNOSIS — N183 Chronic kidney disease, stage 3 (moderate): Secondary | ICD-10-CM | POA: Diagnosis not present

## 2017-07-26 DIAGNOSIS — J449 Chronic obstructive pulmonary disease, unspecified: Secondary | ICD-10-CM | POA: Diagnosis not present

## 2017-07-26 DIAGNOSIS — E1142 Type 2 diabetes mellitus with diabetic polyneuropathy: Secondary | ICD-10-CM | POA: Diagnosis not present

## 2017-07-26 DIAGNOSIS — I130 Hypertensive heart and chronic kidney disease with heart failure and stage 1 through stage 4 chronic kidney disease, or unspecified chronic kidney disease: Secondary | ICD-10-CM | POA: Diagnosis not present

## 2017-07-26 DIAGNOSIS — I4891 Unspecified atrial fibrillation: Secondary | ICD-10-CM | POA: Diagnosis not present

## 2017-07-26 DIAGNOSIS — R059 Cough, unspecified: Secondary | ICD-10-CM

## 2017-07-26 DIAGNOSIS — I5022 Chronic systolic (congestive) heart failure: Secondary | ICD-10-CM | POA: Diagnosis not present

## 2017-07-26 MED ORDER — INSULIN GLARGINE 100 UNIT/ML SOLOSTAR PEN: 20.0000 [IU] | PEN_INJECTOR | Freq: Every day | SUBCUTANEOUS | Status: AC

## 2017-07-26 MED ORDER — DOXYCYCLINE HYCLATE 100 MG PO TABS: 100.0000 mg | ORAL_TABLET | Freq: Two times a day (BID) | ORAL | 0 refills | Status: AC

## 2017-07-26 MED ORDER — LORATADINE 5 MG PO CHEW: 5.0000 mg | CHEWABLE_TABLET | Freq: Every evening | ORAL | Status: AC

## 2017-07-26 MED ORDER — SENNOSIDES-DOCUSATE SODIUM 8.6-50 MG PO TABS: 0.5000 | ORAL_TABLET | Freq: Every day | ORAL | Status: AC

## 2017-07-26 MED ORDER — SIMETHICONE 80 MG PO CHEW: CHEWABLE_TABLET | ORAL | 5 refills | Status: AC

## 2017-07-26 NOTE — Progress Notes (Signed)
This episode started last night.  Burping, gagging, dry heaving.  Nauseated.  Cough with some sputum, yellow, last night.  No vomiting.    This AM with gagging on sputum but not vomiting.    Rhinorrhea noted.  No fevers.  No burning with urination.  Not SOB at time of OV.  She is walking only a few steps and gets fatigued easily.  Fell last night, tripped and went to ground.  No injury other than bruising her buttocks.    Family noted that her breath had a fouler/abnomal odor.    She continues to have weakness in her legs related to deconditioning. She has difficulty walking and needs to use a wheelchair now. Fall risk noted. History of falls.  Meds, vitals, and allergies reviewed.   ROS: Per HPI unless specifically indicated in ROS section   In wheelchair. No apparent distress. Speech is fluent. Alert and oriented. nad ncat Dried bloody nasal discharge noted B, no active bleeding.   OP wnl Ctab, no focal dec in BS rrr abd soft, not ttp, normal BS Ext with trace ankle edema B She has hip flexor weakness noted bilaterally.

## 2017-07-26 NOTE — Patient Instructions (Signed)
Presumed bronchitis.  Stop keflex for now, start doxycycline.   When done with doxy, go back to keflex daily.  Update me as needed later this week.  Take care.  Glad to see you.

## 2017-07-27 DIAGNOSIS — E1142 Type 2 diabetes mellitus with diabetic polyneuropathy: Secondary | ICD-10-CM | POA: Diagnosis not present

## 2017-07-27 DIAGNOSIS — E1122 Type 2 diabetes mellitus with diabetic chronic kidney disease: Secondary | ICD-10-CM | POA: Diagnosis not present

## 2017-07-27 DIAGNOSIS — I5022 Chronic systolic (congestive) heart failure: Secondary | ICD-10-CM | POA: Diagnosis not present

## 2017-07-27 DIAGNOSIS — Z950 Presence of cardiac pacemaker: Secondary | ICD-10-CM | POA: Diagnosis not present

## 2017-07-27 DIAGNOSIS — R269 Unspecified abnormalities of gait and mobility: Secondary | ICD-10-CM | POA: Diagnosis not present

## 2017-07-27 DIAGNOSIS — R531 Weakness: Secondary | ICD-10-CM | POA: Diagnosis not present

## 2017-07-27 DIAGNOSIS — N183 Chronic kidney disease, stage 3 (moderate): Secondary | ICD-10-CM | POA: Diagnosis not present

## 2017-07-27 DIAGNOSIS — J449 Chronic obstructive pulmonary disease, unspecified: Secondary | ICD-10-CM | POA: Diagnosis not present

## 2017-07-27 DIAGNOSIS — I130 Hypertensive heart and chronic kidney disease with heart failure and stage 1 through stage 4 chronic kidney disease, or unspecified chronic kidney disease: Secondary | ICD-10-CM | POA: Diagnosis not present

## 2017-07-27 DIAGNOSIS — I4891 Unspecified atrial fibrillation: Secondary | ICD-10-CM | POA: Diagnosis not present

## 2017-07-27 DIAGNOSIS — Z9181 History of falling: Secondary | ICD-10-CM | POA: Diagnosis not present

## 2017-07-27 DIAGNOSIS — I2510 Atherosclerotic heart disease of native coronary artery without angina pectoris: Secondary | ICD-10-CM | POA: Diagnosis not present

## 2017-07-27 DIAGNOSIS — M1991 Primary osteoarthritis, unspecified site: Secondary | ICD-10-CM | POA: Diagnosis not present

## 2017-07-27 DIAGNOSIS — Z794 Long term (current) use of insulin: Secondary | ICD-10-CM | POA: Diagnosis not present

## 2017-07-27 NOTE — Assessment & Plan Note (Signed)
Presumed bronchitis causing the cough. This may have caused some of the GI upset with burping in gagging and dry heaving if she swallowed some mucous. Recent creatinine was stable. Discussed with her and daughter about options. Stop Keflex for now. Change to doxycycline. When done with doxycycline and then restart Keflex for urinary tract infection prophylaxis. Supportive care otherwise. Update me as needed. All agree with the plan.  Documented flexor weakness noted. They will check on getting a wheelchair and will update me if I need to fill out any papers for them.

## 2017-07-28 DIAGNOSIS — Z9181 History of falling: Secondary | ICD-10-CM | POA: Diagnosis not present

## 2017-07-28 DIAGNOSIS — M1991 Primary osteoarthritis, unspecified site: Secondary | ICD-10-CM | POA: Diagnosis not present

## 2017-07-28 DIAGNOSIS — N183 Chronic kidney disease, stage 3 (moderate): Secondary | ICD-10-CM | POA: Diagnosis not present

## 2017-07-28 DIAGNOSIS — I5022 Chronic systolic (congestive) heart failure: Secondary | ICD-10-CM | POA: Diagnosis not present

## 2017-07-28 DIAGNOSIS — R531 Weakness: Secondary | ICD-10-CM | POA: Diagnosis not present

## 2017-07-28 DIAGNOSIS — J449 Chronic obstructive pulmonary disease, unspecified: Secondary | ICD-10-CM | POA: Diagnosis not present

## 2017-07-28 DIAGNOSIS — Z794 Long term (current) use of insulin: Secondary | ICD-10-CM | POA: Diagnosis not present

## 2017-07-28 DIAGNOSIS — I130 Hypertensive heart and chronic kidney disease with heart failure and stage 1 through stage 4 chronic kidney disease, or unspecified chronic kidney disease: Secondary | ICD-10-CM | POA: Diagnosis not present

## 2017-07-28 DIAGNOSIS — E1122 Type 2 diabetes mellitus with diabetic chronic kidney disease: Secondary | ICD-10-CM | POA: Diagnosis not present

## 2017-07-28 DIAGNOSIS — E1142 Type 2 diabetes mellitus with diabetic polyneuropathy: Secondary | ICD-10-CM | POA: Diagnosis not present

## 2017-07-28 DIAGNOSIS — R269 Unspecified abnormalities of gait and mobility: Secondary | ICD-10-CM | POA: Diagnosis not present

## 2017-07-28 DIAGNOSIS — Z950 Presence of cardiac pacemaker: Secondary | ICD-10-CM | POA: Diagnosis not present

## 2017-07-28 DIAGNOSIS — I2510 Atherosclerotic heart disease of native coronary artery without angina pectoris: Secondary | ICD-10-CM | POA: Diagnosis not present

## 2017-07-28 DIAGNOSIS — I4891 Unspecified atrial fibrillation: Secondary | ICD-10-CM | POA: Diagnosis not present

## 2017-07-29 DIAGNOSIS — R531 Weakness: Secondary | ICD-10-CM | POA: Diagnosis not present

## 2017-07-29 DIAGNOSIS — E1142 Type 2 diabetes mellitus with diabetic polyneuropathy: Secondary | ICD-10-CM | POA: Diagnosis not present

## 2017-07-29 DIAGNOSIS — E1122 Type 2 diabetes mellitus with diabetic chronic kidney disease: Secondary | ICD-10-CM | POA: Diagnosis not present

## 2017-07-29 DIAGNOSIS — I130 Hypertensive heart and chronic kidney disease with heart failure and stage 1 through stage 4 chronic kidney disease, or unspecified chronic kidney disease: Secondary | ICD-10-CM | POA: Diagnosis not present

## 2017-07-29 DIAGNOSIS — Z9181 History of falling: Secondary | ICD-10-CM | POA: Diagnosis not present

## 2017-07-29 DIAGNOSIS — Z794 Long term (current) use of insulin: Secondary | ICD-10-CM | POA: Diagnosis not present

## 2017-07-29 DIAGNOSIS — Z950 Presence of cardiac pacemaker: Secondary | ICD-10-CM | POA: Diagnosis not present

## 2017-07-29 DIAGNOSIS — N183 Chronic kidney disease, stage 3 (moderate): Secondary | ICD-10-CM | POA: Diagnosis not present

## 2017-07-29 DIAGNOSIS — J449 Chronic obstructive pulmonary disease, unspecified: Secondary | ICD-10-CM | POA: Diagnosis not present

## 2017-07-29 DIAGNOSIS — I4891 Unspecified atrial fibrillation: Secondary | ICD-10-CM | POA: Diagnosis not present

## 2017-07-29 DIAGNOSIS — I2510 Atherosclerotic heart disease of native coronary artery without angina pectoris: Secondary | ICD-10-CM | POA: Diagnosis not present

## 2017-07-29 DIAGNOSIS — R269 Unspecified abnormalities of gait and mobility: Secondary | ICD-10-CM | POA: Diagnosis not present

## 2017-07-29 DIAGNOSIS — M1991 Primary osteoarthritis, unspecified site: Secondary | ICD-10-CM | POA: Diagnosis not present

## 2017-07-29 DIAGNOSIS — I5022 Chronic systolic (congestive) heart failure: Secondary | ICD-10-CM | POA: Diagnosis not present

## 2017-08-01 ENCOUNTER — Encounter: Payer: Self-pay | Admitting: Family Medicine

## 2017-08-01 DIAGNOSIS — I5022 Chronic systolic (congestive) heart failure: Secondary | ICD-10-CM | POA: Diagnosis not present

## 2017-08-01 DIAGNOSIS — Z9181 History of falling: Secondary | ICD-10-CM | POA: Diagnosis not present

## 2017-08-01 DIAGNOSIS — I4891 Unspecified atrial fibrillation: Secondary | ICD-10-CM | POA: Diagnosis not present

## 2017-08-01 DIAGNOSIS — M1991 Primary osteoarthritis, unspecified site: Secondary | ICD-10-CM | POA: Diagnosis not present

## 2017-08-01 DIAGNOSIS — R269 Unspecified abnormalities of gait and mobility: Secondary | ICD-10-CM | POA: Diagnosis not present

## 2017-08-01 DIAGNOSIS — E1142 Type 2 diabetes mellitus with diabetic polyneuropathy: Secondary | ICD-10-CM | POA: Diagnosis not present

## 2017-08-01 DIAGNOSIS — N183 Chronic kidney disease, stage 3 (moderate): Secondary | ICD-10-CM | POA: Diagnosis not present

## 2017-08-01 DIAGNOSIS — I2510 Atherosclerotic heart disease of native coronary artery without angina pectoris: Secondary | ICD-10-CM | POA: Diagnosis not present

## 2017-08-01 DIAGNOSIS — R531 Weakness: Secondary | ICD-10-CM | POA: Diagnosis not present

## 2017-08-01 DIAGNOSIS — I130 Hypertensive heart and chronic kidney disease with heart failure and stage 1 through stage 4 chronic kidney disease, or unspecified chronic kidney disease: Secondary | ICD-10-CM | POA: Diagnosis not present

## 2017-08-01 DIAGNOSIS — E1122 Type 2 diabetes mellitus with diabetic chronic kidney disease: Secondary | ICD-10-CM | POA: Diagnosis not present

## 2017-08-01 DIAGNOSIS — Z794 Long term (current) use of insulin: Secondary | ICD-10-CM | POA: Diagnosis not present

## 2017-08-01 DIAGNOSIS — Z950 Presence of cardiac pacemaker: Secondary | ICD-10-CM | POA: Diagnosis not present

## 2017-08-01 DIAGNOSIS — J449 Chronic obstructive pulmonary disease, unspecified: Secondary | ICD-10-CM | POA: Diagnosis not present

## 2017-08-02 ENCOUNTER — Other Ambulatory Visit: Payer: Self-pay | Admitting: Family Medicine

## 2017-08-02 NOTE — Telephone Encounter (Signed)
Received refill request electronically Last 07/09/17 #60 Last office visit 07/16/17 acute

## 2017-08-03 ENCOUNTER — Encounter: Payer: Self-pay | Admitting: *Deleted

## 2017-08-03 ENCOUNTER — Other Ambulatory Visit: Payer: Self-pay | Admitting: *Deleted

## 2017-08-03 ENCOUNTER — Telehealth: Payer: Self-pay | Admitting: Family Medicine

## 2017-08-03 DIAGNOSIS — I6523 Occlusion and stenosis of bilateral carotid arteries: Secondary | ICD-10-CM

## 2017-08-03 DIAGNOSIS — R29898 Other symptoms and signs involving the musculoskeletal system: Secondary | ICD-10-CM

## 2017-08-03 DIAGNOSIS — N183 Chronic kidney disease, stage 3 (moderate): Secondary | ICD-10-CM | POA: Diagnosis not present

## 2017-08-03 DIAGNOSIS — Z950 Presence of cardiac pacemaker: Secondary | ICD-10-CM | POA: Diagnosis not present

## 2017-08-03 DIAGNOSIS — I2510 Atherosclerotic heart disease of native coronary artery without angina pectoris: Secondary | ICD-10-CM | POA: Diagnosis not present

## 2017-08-03 DIAGNOSIS — I130 Hypertensive heart and chronic kidney disease with heart failure and stage 1 through stage 4 chronic kidney disease, or unspecified chronic kidney disease: Secondary | ICD-10-CM | POA: Diagnosis not present

## 2017-08-03 DIAGNOSIS — I5022 Chronic systolic (congestive) heart failure: Secondary | ICD-10-CM | POA: Diagnosis not present

## 2017-08-03 DIAGNOSIS — Z794 Long term (current) use of insulin: Secondary | ICD-10-CM | POA: Diagnosis not present

## 2017-08-03 DIAGNOSIS — R531 Weakness: Secondary | ICD-10-CM | POA: Diagnosis not present

## 2017-08-03 DIAGNOSIS — I4891 Unspecified atrial fibrillation: Secondary | ICD-10-CM | POA: Diagnosis not present

## 2017-08-03 DIAGNOSIS — E1142 Type 2 diabetes mellitus with diabetic polyneuropathy: Secondary | ICD-10-CM | POA: Diagnosis not present

## 2017-08-03 DIAGNOSIS — J449 Chronic obstructive pulmonary disease, unspecified: Secondary | ICD-10-CM | POA: Diagnosis not present

## 2017-08-03 DIAGNOSIS — Z9181 History of falling: Secondary | ICD-10-CM | POA: Diagnosis not present

## 2017-08-03 DIAGNOSIS — M1991 Primary osteoarthritis, unspecified site: Secondary | ICD-10-CM | POA: Diagnosis not present

## 2017-08-03 DIAGNOSIS — E1122 Type 2 diabetes mellitus with diabetic chronic kidney disease: Secondary | ICD-10-CM | POA: Diagnosis not present

## 2017-08-03 DIAGNOSIS — R269 Unspecified abnormalities of gait and mobility: Secondary | ICD-10-CM | POA: Diagnosis not present

## 2017-08-03 NOTE — Telephone Encounter (Signed)
Please print it in whatever way they will accept it and I'll sign it when I get the office. Thanks.

## 2017-08-03 NOTE — Telephone Encounter (Signed)
Please see the note from [facility] requesting orders for medical equipment and Occupational Therapy.

## 2017-08-03 NOTE — Telephone Encounter (Signed)
Will need diagnosis for each item.

## 2017-08-03 NOTE — Telephone Encounter (Signed)
Verbal order given to Vernona Rieger as instructed by telephone. Vernona Rieger stated that she needed two different scripts faxed to her at [phone] for the wheelchair and elevated bathroom seat.

## 2017-08-03 NOTE — Telephone Encounter (Signed)
Sent. Thanks.   

## 2017-08-03 NOTE — Telephone Encounter (Signed)
Copied from CRM #[id number]. Topic: Quick Communication - See Telephone Encounter >> Aug 03, 2017 10:43 AM Cipriano Bunker wrote: CRM for notification. See Telephone encounter for:  08/03/17. [facility] asking for Order: Wheelchair with removal arms and elevated Bathroom Seat with arms. Also verbal order for occupational therapy consult. Also wants to verbal order for PT frequency to 3 times a week for 2 weeks.

## 2017-08-03 NOTE — Telephone Encounter (Signed)
Please give the order.  Thanks.   

## 2017-08-04 ENCOUNTER — Encounter: Payer: Self-pay | Admitting: *Deleted

## 2017-08-04 DIAGNOSIS — M1991 Primary osteoarthritis, unspecified site: Secondary | ICD-10-CM | POA: Diagnosis not present

## 2017-08-04 DIAGNOSIS — E1122 Type 2 diabetes mellitus with diabetic chronic kidney disease: Secondary | ICD-10-CM | POA: Diagnosis not present

## 2017-08-04 DIAGNOSIS — E1142 Type 2 diabetes mellitus with diabetic polyneuropathy: Secondary | ICD-10-CM | POA: Diagnosis not present

## 2017-08-04 DIAGNOSIS — Z950 Presence of cardiac pacemaker: Secondary | ICD-10-CM | POA: Diagnosis not present

## 2017-08-04 DIAGNOSIS — R531 Weakness: Secondary | ICD-10-CM | POA: Diagnosis not present

## 2017-08-04 DIAGNOSIS — N183 Chronic kidney disease, stage 3 (moderate): Secondary | ICD-10-CM | POA: Diagnosis not present

## 2017-08-04 DIAGNOSIS — J449 Chronic obstructive pulmonary disease, unspecified: Secondary | ICD-10-CM | POA: Diagnosis not present

## 2017-08-04 DIAGNOSIS — Z794 Long term (current) use of insulin: Secondary | ICD-10-CM | POA: Diagnosis not present

## 2017-08-04 DIAGNOSIS — I2510 Atherosclerotic heart disease of native coronary artery without angina pectoris: Secondary | ICD-10-CM | POA: Diagnosis not present

## 2017-08-04 DIAGNOSIS — R269 Unspecified abnormalities of gait and mobility: Secondary | ICD-10-CM | POA: Diagnosis not present

## 2017-08-04 DIAGNOSIS — R29898 Other symptoms and signs involving the musculoskeletal system: Secondary | ICD-10-CM | POA: Insufficient documentation

## 2017-08-04 DIAGNOSIS — I130 Hypertensive heart and chronic kidney disease with heart failure and stage 1 through stage 4 chronic kidney disease, or unspecified chronic kidney disease: Secondary | ICD-10-CM | POA: Diagnosis not present

## 2017-08-04 DIAGNOSIS — Z9181 History of falling: Secondary | ICD-10-CM | POA: Diagnosis not present

## 2017-08-04 DIAGNOSIS — I4891 Unspecified atrial fibrillation: Secondary | ICD-10-CM | POA: Diagnosis not present

## 2017-08-04 DIAGNOSIS — I5022 Chronic systolic (congestive) heart failure: Secondary | ICD-10-CM | POA: Diagnosis not present

## 2017-08-04 NOTE — Telephone Encounter (Signed)
All would apply to both: leg weakness R29.898, fall risk Z91.81, deconditioning R29.898.   Thanks.

## 2017-08-04 NOTE — Telephone Encounter (Signed)
Printed, signed and faxed.  

## 2017-08-07 ENCOUNTER — Emergency Department (HOSPITAL_COMMUNITY): Payer: Medicare Other

## 2017-08-07 ENCOUNTER — Inpatient Hospital Stay (HOSPITAL_COMMUNITY)
Admission: EM | Admit: 2017-08-07 | Discharge: 2017-09-10 | DRG: 193 | Disposition: E | Payer: Medicare Other | Attending: Internal Medicine | Admitting: Internal Medicine

## 2017-08-07 ENCOUNTER — Encounter (HOSPITAL_COMMUNITY): Payer: Self-pay | Admitting: Emergency Medicine

## 2017-08-07 DIAGNOSIS — D631 Anemia in chronic kidney disease: Secondary | ICD-10-CM | POA: Diagnosis present

## 2017-08-07 DIAGNOSIS — Z7901 Long term (current) use of anticoagulants: Secondary | ICD-10-CM | POA: Diagnosis not present

## 2017-08-07 DIAGNOSIS — Y9301 Activity, walking, marching and hiking: Secondary | ICD-10-CM | POA: Diagnosis present

## 2017-08-07 DIAGNOSIS — F329 Major depressive disorder, single episode, unspecified: Secondary | ICD-10-CM | POA: Diagnosis not present

## 2017-08-07 DIAGNOSIS — R188 Other ascites: Secondary | ICD-10-CM | POA: Diagnosis not present

## 2017-08-07 DIAGNOSIS — S62002A Unspecified fracture of navicular [scaphoid] bone of left wrist, initial encounter for closed fracture: Secondary | ICD-10-CM | POA: Diagnosis not present

## 2017-08-07 DIAGNOSIS — Z87440 Personal history of urinary (tract) infections: Secondary | ICD-10-CM | POA: Diagnosis not present

## 2017-08-07 DIAGNOSIS — N179 Acute kidney failure, unspecified: Secondary | ICD-10-CM | POA: Diagnosis not present

## 2017-08-07 DIAGNOSIS — I2510 Atherosclerotic heart disease of native coronary artery without angina pectoris: Secondary | ICD-10-CM | POA: Diagnosis not present

## 2017-08-07 DIAGNOSIS — S62102A Fracture of unspecified carpal bone, left wrist, initial encounter for closed fracture: Secondary | ICD-10-CM

## 2017-08-07 DIAGNOSIS — I10 Essential (primary) hypertension: Secondary | ICD-10-CM | POA: Diagnosis not present

## 2017-08-07 DIAGNOSIS — Z6832 Body mass index (BMI) 32.0-32.9, adult: Secondary | ICD-10-CM

## 2017-08-07 DIAGNOSIS — Y9289 Other specified places as the place of occurrence of the external cause: Secondary | ICD-10-CM

## 2017-08-07 DIAGNOSIS — Z515 Encounter for palliative care: Secondary | ICD-10-CM | POA: Diagnosis not present

## 2017-08-07 DIAGNOSIS — E1165 Type 2 diabetes mellitus with hyperglycemia: Secondary | ICD-10-CM | POA: Diagnosis not present

## 2017-08-07 DIAGNOSIS — L8990 Pressure ulcer of unspecified site, unspecified stage: Secondary | ICD-10-CM | POA: Diagnosis present

## 2017-08-07 DIAGNOSIS — F411 Generalized anxiety disorder: Secondary | ICD-10-CM | POA: Diagnosis present

## 2017-08-07 DIAGNOSIS — E1121 Type 2 diabetes mellitus with diabetic nephropathy: Secondary | ICD-10-CM | POA: Diagnosis present

## 2017-08-07 DIAGNOSIS — N19 Unspecified kidney failure: Secondary | ICD-10-CM | POA: Diagnosis not present

## 2017-08-07 DIAGNOSIS — R112 Nausea with vomiting, unspecified: Secondary | ICD-10-CM

## 2017-08-07 DIAGNOSIS — Z8249 Family history of ischemic heart disease and other diseases of the circulatory system: Secondary | ICD-10-CM

## 2017-08-07 DIAGNOSIS — K219 Gastro-esophageal reflux disease without esophagitis: Secondary | ICD-10-CM | POA: Diagnosis present

## 2017-08-07 DIAGNOSIS — Z950 Presence of cardiac pacemaker: Secondary | ICD-10-CM | POA: Diagnosis not present

## 2017-08-07 DIAGNOSIS — G9341 Metabolic encephalopathy: Secondary | ICD-10-CM | POA: Diagnosis not present

## 2017-08-07 DIAGNOSIS — Z833 Family history of diabetes mellitus: Secondary | ICD-10-CM | POA: Diagnosis not present

## 2017-08-07 DIAGNOSIS — S52592A Other fractures of lower end of left radius, initial encounter for closed fracture: Secondary | ICD-10-CM | POA: Diagnosis not present

## 2017-08-07 DIAGNOSIS — E1129 Type 2 diabetes mellitus with other diabetic kidney complication: Secondary | ICD-10-CM | POA: Diagnosis present

## 2017-08-07 DIAGNOSIS — E1122 Type 2 diabetes mellitus with diabetic chronic kidney disease: Secondary | ICD-10-CM | POA: Diagnosis present

## 2017-08-07 DIAGNOSIS — I4891 Unspecified atrial fibrillation: Secondary | ICD-10-CM | POA: Diagnosis not present

## 2017-08-07 DIAGNOSIS — S4992XA Unspecified injury of left shoulder and upper arm, initial encounter: Secondary | ICD-10-CM | POA: Diagnosis not present

## 2017-08-07 DIAGNOSIS — W19XXXS Unspecified fall, sequela: Secondary | ICD-10-CM | POA: Diagnosis not present

## 2017-08-07 DIAGNOSIS — I350 Nonrheumatic aortic (valve) stenosis: Secondary | ICD-10-CM | POA: Diagnosis present

## 2017-08-07 DIAGNOSIS — J189 Pneumonia, unspecified organism: Principal | ICD-10-CM | POA: Diagnosis present

## 2017-08-07 DIAGNOSIS — D649 Anemia, unspecified: Secondary | ICD-10-CM | POA: Diagnosis present

## 2017-08-07 DIAGNOSIS — R195 Other fecal abnormalities: Secondary | ICD-10-CM | POA: Diagnosis present

## 2017-08-07 DIAGNOSIS — R627 Adult failure to thrive: Secondary | ICD-10-CM | POA: Diagnosis present

## 2017-08-07 DIAGNOSIS — Z66 Do not resuscitate: Secondary | ICD-10-CM | POA: Diagnosis present

## 2017-08-07 DIAGNOSIS — IMO0002 Reserved for concepts with insufficient information to code with codable children: Secondary | ICD-10-CM | POA: Diagnosis present

## 2017-08-07 DIAGNOSIS — Z7189 Other specified counseling: Secondary | ICD-10-CM | POA: Diagnosis not present

## 2017-08-07 DIAGNOSIS — Z794 Long term (current) use of insulin: Secondary | ICD-10-CM

## 2017-08-07 DIAGNOSIS — S52502A Unspecified fracture of the lower end of left radius, initial encounter for closed fracture: Secondary | ICD-10-CM | POA: Diagnosis not present

## 2017-08-07 DIAGNOSIS — E669 Obesity, unspecified: Secondary | ICD-10-CM | POA: Diagnosis present

## 2017-08-07 DIAGNOSIS — D509 Iron deficiency anemia, unspecified: Secondary | ICD-10-CM | POA: Diagnosis not present

## 2017-08-07 DIAGNOSIS — R51 Headache: Secondary | ICD-10-CM | POA: Diagnosis not present

## 2017-08-07 DIAGNOSIS — I252 Old myocardial infarction: Secondary | ICD-10-CM | POA: Diagnosis not present

## 2017-08-07 DIAGNOSIS — Z9181 History of falling: Secondary | ICD-10-CM | POA: Diagnosis not present

## 2017-08-07 DIAGNOSIS — E785 Hyperlipidemia, unspecified: Secondary | ICD-10-CM | POA: Diagnosis present

## 2017-08-07 DIAGNOSIS — I132 Hypertensive heart and chronic kidney disease with heart failure and with stage 5 chronic kidney disease, or end stage renal disease: Secondary | ICD-10-CM | POA: Diagnosis present

## 2017-08-07 DIAGNOSIS — T148XXA Other injury of unspecified body region, initial encounter: Secondary | ICD-10-CM

## 2017-08-07 DIAGNOSIS — F32A Depression, unspecified: Secondary | ICD-10-CM | POA: Diagnosis present

## 2017-08-07 DIAGNOSIS — K7460 Unspecified cirrhosis of liver: Secondary | ICD-10-CM | POA: Diagnosis present

## 2017-08-07 DIAGNOSIS — R945 Abnormal results of liver function studies: Secondary | ICD-10-CM | POA: Diagnosis not present

## 2017-08-07 DIAGNOSIS — W19XXXA Unspecified fall, initial encounter: Secondary | ICD-10-CM

## 2017-08-07 DIAGNOSIS — D696 Thrombocytopenia, unspecified: Secondary | ICD-10-CM | POA: Diagnosis present

## 2017-08-07 DIAGNOSIS — D6959 Other secondary thrombocytopenia: Secondary | ICD-10-CM | POA: Diagnosis present

## 2017-08-07 DIAGNOSIS — Y95 Nosocomial condition: Secondary | ICD-10-CM | POA: Diagnosis present

## 2017-08-07 DIAGNOSIS — R0681 Apnea, not elsewhere classified: Secondary | ICD-10-CM | POA: Diagnosis not present

## 2017-08-07 DIAGNOSIS — S0083XA Contusion of other part of head, initial encounter: Secondary | ICD-10-CM | POA: Diagnosis present

## 2017-08-07 DIAGNOSIS — K922 Gastrointestinal hemorrhage, unspecified: Secondary | ICD-10-CM | POA: Diagnosis present

## 2017-08-07 DIAGNOSIS — R1310 Dysphagia, unspecified: Secondary | ICD-10-CM

## 2017-08-07 DIAGNOSIS — N184 Chronic kidney disease, stage 4 (severe): Secondary | ICD-10-CM | POA: Diagnosis not present

## 2017-08-07 DIAGNOSIS — E861 Hypovolemia: Secondary | ICD-10-CM | POA: Diagnosis present

## 2017-08-07 DIAGNOSIS — S52602A Unspecified fracture of lower end of left ulna, initial encounter for closed fracture: Secondary | ICD-10-CM | POA: Diagnosis not present

## 2017-08-07 DIAGNOSIS — N185 Chronic kidney disease, stage 5: Secondary | ICD-10-CM | POA: Diagnosis present

## 2017-08-07 DIAGNOSIS — Z8673 Personal history of transient ischemic attack (TIA), and cerebral infarction without residual deficits: Secondary | ICD-10-CM | POA: Diagnosis not present

## 2017-08-07 DIAGNOSIS — K5900 Constipation, unspecified: Secondary | ICD-10-CM | POA: Diagnosis not present

## 2017-08-07 DIAGNOSIS — W1781XA Fall down embankment (hill), initial encounter: Secondary | ICD-10-CM

## 2017-08-07 DIAGNOSIS — S20219A Contusion of unspecified front wall of thorax, initial encounter: Secondary | ICD-10-CM | POA: Diagnosis not present

## 2017-08-07 DIAGNOSIS — I5032 Chronic diastolic (congestive) heart failure: Secondary | ICD-10-CM | POA: Diagnosis present

## 2017-08-07 DIAGNOSIS — I480 Paroxysmal atrial fibrillation: Secondary | ICD-10-CM | POA: Diagnosis present

## 2017-08-07 DIAGNOSIS — R1110 Vomiting, unspecified: Secondary | ICD-10-CM

## 2017-08-07 DIAGNOSIS — R7989 Other specified abnormal findings of blood chemistry: Secondary | ICD-10-CM

## 2017-08-07 DIAGNOSIS — M25512 Pain in left shoulder: Secondary | ICD-10-CM | POA: Diagnosis not present

## 2017-08-07 LAB — CBC WITH DIFFERENTIAL/PLATELET
BASOS ABS: 0 10*3/uL (ref 0.0–0.1)
Basophils Relative: 0 %
Eosinophils Absolute: 0.1 10*3/uL (ref 0.0–0.7)
Eosinophils Relative: 1 %
HEMATOCRIT: 28.6 % — AB (ref 36.0–46.0)
Hemoglobin: 9.1 g/dL — ABNORMAL LOW (ref 12.0–15.0)
LYMPHS ABS: 2.4 10*3/uL (ref 0.7–4.0)
Lymphocytes Relative: 19 %
MCH: 30.4 pg (ref 26.0–34.0)
MCHC: 31.8 g/dL (ref 30.0–36.0)
MCV: 95.7 fL (ref 78.0–100.0)
MONO ABS: 1.4 10*3/uL — AB (ref 0.1–1.0)
MONOS PCT: 11 %
NEUTROS ABS: 8.5 10*3/uL — AB (ref 1.7–7.7)
Neutrophils Relative %: 69 %
PLATELETS: 135 10*3/uL — AB (ref 150–400)
RBC: 2.99 MIL/uL — ABNORMAL LOW (ref 3.87–5.11)
RDW: 22.6 % — AB (ref 11.5–15.5)
WBC: 12.4 10*3/uL — ABNORMAL HIGH (ref 4.0–10.5)

## 2017-08-07 LAB — URINALYSIS, ROUTINE W REFLEX MICROSCOPIC
BACTERIA UA: NONE SEEN
Bilirubin Urine: NEGATIVE
Glucose, UA: NEGATIVE mg/dL
Hgb urine dipstick: NEGATIVE
KETONES UR: NEGATIVE mg/dL
NITRITE: NEGATIVE
PROTEIN: NEGATIVE mg/dL
Specific Gravity, Urine: 1.013 (ref 1.005–1.030)
pH: 5 (ref 5.0–8.0)

## 2017-08-07 LAB — POC OCCULT BLOOD, ED: Fecal Occult Bld: POSITIVE — AB

## 2017-08-07 MED ORDER — DEXTROSE 5 % IV SOLN: 500.0000 mg | Freq: Once | INTRAVENOUS | Status: DC

## 2017-08-07 MED ORDER — SODIUM CHLORIDE 0.9 % IV BOLUS (SEPSIS)
500.0000 mL | Freq: Once | INTRAVENOUS | Status: AC
  Administered 2017-08-07: 500 mL via INTRAVENOUS

## 2017-08-07 MED ORDER — DEXTROSE 5 % IV SOLN: 1.0000 g | Freq: Once | INTRAVENOUS | Status: DC

## 2017-08-07 MED ORDER — SODIUM CHLORIDE 0.9 % IV BOLUS (SEPSIS)
1000.0000 mL | Freq: Once | INTRAVENOUS | Status: AC
  Administered 2017-08-08: 1000 mL via INTRAVENOUS

## 2017-08-07 MED ORDER — MORPHINE SULFATE (PF) 4 MG/ML IV SOLN
1.0000 mg | Freq: Once | INTRAVENOUS | Status: AC
  Administered 2017-08-08: 1 mg via INTRAVENOUS
  Filled 2017-08-07: qty 1

## 2017-08-07 NOTE — ED Notes (Signed)
Per lab recollect cmp

## 2017-08-07 NOTE — ED Notes (Signed)
Delay in lab draw,  Pt not in room at this time. 

## 2017-08-07 NOTE — Progress Notes (Signed)
Orthopedic Tech Progress Note Patient Details:  Danielle Benitez 10/23/1931 [id number]  Ortho Devices Type of Ortho Device: Ace wrap, Arm sling, Sugartong splint Ortho Device/Splint Location: LUE Ortho Device/Splint Interventions: Ordered, Application   Jennye Moccasin 07/21/2017, 10:52 PM

## 2017-08-07 NOTE — ED Provider Notes (Signed)
[facility] EMERGENCY DEPARTMENT Provider Note   CSN: [id number] Arrival date & time: 07/15/2017  2106     History   Chief Complaint Chief Complaint  Patient presents with  . Fall  . Wrist Pain    HPI Danielle Benitez is a 83 y.o. female.  HPI  83 year old female with history of multiple falls presents today after falling twice today.  Per the patient and her family both were due to balance and symptoms that have been previously present without any syncope.  Primary concern is pain to the left wrist which was x-rayed prior to my evaluation and shows a distal radius ulna fracture impacted with angulation and displacement.  However, she also has a contusion to her face.  She has multiple contusions on throughout her body including her left shoulder, trunk, and extremities.  The daughter states she has been seen previously for most of these injuries.  She has not been eating or drinking very well and states that she has multiple frequent urinary tract infections and has been generally in poor health over the past several months.  They deny any new fever, chills, chest pain, or dyspnea.   Past Medical History:  Diagnosis Date  . Abnormality of gait   . Anemia, unspecified   . Anticoagulated- Eliquis 07/31/2015  . Anxiety state, unspecified   . Aortic stenosis    a. Mild by echo 08/2013, not seen on repeat echo 02/2017.  Marland Kitchen Bifascicular block    afib  . Carotid artery disease (HCC)    a. Carotid dopplers (5/27) with 78-24% LICA stenosis (of note, prior ABI normal).  . Chronic diastolic CHF (congestive heart failure) ([place])    a. Dx 07/2015 - EF 35-40%, unclear etiology ? r/t afib or viral etiology. b. EF normal in 02/2017.  . CKD (chronic kidney disease), stage III ([place])   . Depressive disorder, not elsewhere classified   . DM (diabetes mellitus), type 2, uncontrolled, with renal complications ([place]) 2/35/3614  . Eczema 11/22/2013  . Esophageal dysmotility 08/05/2015  .  Essential hypertension   . Female stress incontinence 05/06/2015  . Full dentures   . GERD (gastroesophageal reflux disease)   . Gout   . HOH (hard of hearing)   . Internal hemorrhoids without mention of complication   . Iron deficiency   . Lumbago   . Memory loss   . Mild CAD    a. Cath 2013: 20-30% prox RCA.  Marland Kitchen Myocardial infarction ([place]) 07/2015  . Nonspecific abnormal results of liver function study   . Obesity, unspecified   . Orthostatic hypotension   . Osteoarthritis   . Other and unspecified hyperlipidemia   . PAF (paroxysmal atrial fibrillation) ([place]) 07/11/2015   a. prior DCCVs, amiodarone, anticoagulation.  . Pain in joint, shoulder region 11/22/2013   left   . Personal history of fall   . Presence of permanent cardiac pacemaker   . Pruritus 11/22/2013  . S/P cardiac pacemaker procedure 01/11/2012   a. for symptomatic bradycardia. Followed by Dr. Lovena Le. Device changed out 2017 due to ppm infection.  . Stroke [facility])    a. evidence of prior strokes on MRI in 2018.  Marland Kitchen UTI (urinary tract infection) 04/2017  . Wears glasses     Patient Active Problem List   Diagnosis Date Noted  . Leg weakness 08/04/2017  . Muscular deconditioning 08/04/2017  . Upper respiratory tract infection 07/07/2017  . Confusion 06/03/2017  . History of CVA (cerebrovascular accident)   .  Hyponatremia   . Stage 3 chronic kidney disease ([place])   . AKI (acute kidney injury) ([place])   . Acute blood loss anemia   . UTI (urinary tract infection) 04/28/2017  . Acute metabolic encephalopathy 29/56/2130  . Acute lower UTI 04/28/2017  . Diabetes mellitus with diabetic nephropathy ([place]) 04/28/2017  . Dyslipidemia associated with type 2 diabetes mellitus ([place]) 04/28/2017  . Chronic diastolic CHF (congestive heart failure) ([place]) 04/28/2017  . Altered mental status   . TIA (transient ischemic attack) 03/31/2017  . Thrombocytopenia ([place]) 03/31/2017  . Chest pain 02/14/2017  . Dysuria 09/21/2016  . At  high risk for falls 06/25/2016  . Skin tear of elbow without complication 86/57/8469  . Injury of left shoulder and upper arm 06/24/2016  . Unequal blood pressure in upper extremities 06/03/2016  . Cardiac pacemaker in situ 04/12/2016  . Cough 03/08/2016  . Risk for falls 03/05/2016  . Insomnia 01/21/2016  . Chronic systolic congestive heart failure, NYHA class 3 ([place]) 01/20/2016  . LFT elevation 11/21/2015  . Weakness 11/06/2015  . Dysphagia   . Esophageal dysmotility 08/05/2015  . Anticoagulated- Eliquis 07/31/2015  . Normochromic normocytic anemia 07/31/2015  . Atrial fibrillation - 07/11/15 by PPM interrogation, recurrent 10/19 after DCCV 10/14 07/18/2015  . Moderate mitral regurgitation 07/18/2015  . Moderate tricuspid regurgitation 07/18/2015  . History of pacemaker - Medtronic 07/18/2015  . CKD (chronic kidney disease), stage III ([place])   . Orthostatic hypotension   . Essential hypertension   . Pericardial effusion 07/15/2015  . Female stress incontinence 05/06/2015  . Knee pain 12/18/2014  . Low back pain 02/27/2014  . Obese 02/27/2014  . Depression 02/27/2014  . Pain in joint, shoulder region 11/22/2013  . Eczema 11/22/2013  . S/P cardiac pacemaker procedure, PPM Medtronic REVO placed 01/11/2012 01/11/2012  . COPD bronchitis by CXR 01/10/2012  . Dyslipidemia 01/10/2012  . Mild CAD - 20-30% RCA in 2013 01/10/2012  . Fatigue 01/10/2012  . Sinus bradycardia, HR as low As 35 01/10/2012  . Mild aortic stenosis 01/10/2012  . Chest wall pain 01/08/2012  . SOB (shortness of breath) 01/08/2012  . DM, type 2, with renal complications  62/95/2841    Past Surgical History:  Procedure Laterality Date  . CARDIAC CATHETERIZATION  12/03/2003   Dr Rex Kras  . CARDIOVERSION N/A 07/25/2015   Procedure: CARDIOVERSION;  Surgeon: Fay Records, MD;  Location: [facility] ENDOSCOPY;  Service: Cardiovascular;  Laterality: N/A;  . CARDIOVERSION N/A 08/06/2015   Procedure: CARDIOVERSION;  Surgeon:  Larey Dresser, MD;  Location: [facility];  Service: Cardiovascular;  Laterality: N/A;  . CATARACT EXTRACTION W/ INTRAOCULAR LENS  IMPLANT, BILATERAL Bilateral 03/2015-04/2015  . COLONOSCOPY     ?????  . EP IMPLANTABLE DEVICE N/A 04/15/2016   Procedure: Pacemaker Implant;  Surgeon: Evans Lance, MD;  Location: [place] CV LAB;  Service: Cardiovascular;  Laterality: N/A;  . ESOPHAGOGASTRODUODENOSCOPY N/A 08/08/2015   Procedure: ESOPHAGOGASTRODUODENOSCOPY (EGD);  Surgeon: Gatha Mayer, MD;  Location: [facility] ENDOSCOPY;  Service: Endoscopy;  Laterality: N/A;  . EXTREMITY CYST EXCISION     finger  . EYE SURGERY Bilateral    catartact  . INSERT / REPLACE / REMOVE PACEMAKER    . LEFT HEART CATHETERIZATION WITH CORONARY ANGIOGRAM N/A 01/10/2012   Procedure: LEFT HEART CATHETERIZATION WITH CORONARY ANGIOGRAM;  Surgeon: Pixie Casino, MD;  Location: [facility] CATH LAB;  Service: Cardiovascular;  Laterality: N/A;  . ORIF WRIST FRACTURE Right 04/09/2014   Procedure: OPEN REDUCTION INTERNAL FIXATION (  ORIF) RIGHT DISPLACED  DISTAL RADIUS  FRACTURE;  Surgeon: Roseanne Kaufman, MD;  Location: [facility];  Service: Orthopedics;  Laterality: Right;  . PACEMAKER LEAD REMOVAL  04/12/2016   TEMPORARY PACEMAKER INSERTION  . PACEMAKER LEAD REMOVAL  04/15/2016   Procedure: Pacemaker Lead Removal;  Surgeon: Evans Lance, MD;  Location: [place] CV LAB;  Service: Cardiovascular;;  . PACEMAKER LEAD REMOVAL N/A 04/12/2016   Procedure: PACEMAKER LEAD REMOVAL;  Surgeon: Evans Lance, MD;  Location: [facility];  Service: Cardiovascular;  Laterality: N/A;  . PERMANENT PACEMAKER INSERTION Left 01/11/2012   Procedure: PERMANENT PACEMAKER INSERTION;  Surgeon: Sanda Klein, MD;  Location: [facility] CATH LAB;  Service: Cardiovascular;  Laterality: Left;  . TEE WITHOUT CARDIOVERSION N/A 07/25/2015   Procedure: TRANSESOPHAGEAL ECHOCARDIOGRAM (TEE);  Surgeon: Fay Records, MD;  Location: [facility] ENDOSCOPY;  Service: Cardiovascular;   Laterality: N/A;    OB History    No data available       Home Medications    Prior to Admission medications   Medication Sig Start Date End Date Taking? Authorizing Provider  albuterol (PROVENTIL HFA;VENTOLIN HFA) 108 (90 Base) MCG/ACT inhaler Inhale 2 puffs into the lungs every 4 (four) hours as needed for wheezing or shortness of breath (cough, shortness of breath or wheezing.). 10/15/16   Elby Beck, FNP  allopurinol (ZYLOPRIM) 100 MG tablet Take 2 tablets (200 mg total) by mouth daily. 07/09/17   Aline August, MD  allopurinol (ZYLOPRIM) 300 MG tablet TAKE 1 TABLET BY MOUTH DAILY 08/03/17   Tonia Ghent, MD  amiodarone (PACERONE) 200 MG tablet TAKE '200MG'$  BY MOUTH DAILY 05/04/17   Tonia Ghent, MD  atorvastatin (LIPITOR) 20 MG tablet Take 1 tablet (20 mg total) by mouth daily at 6 PM. 04/06/17   Velvet Bathe, MD  Blood Glucose Monitoring Suppl (ONE TOUCH ULTRA 2) w/Device KIT Check blood sugar twice a day and as directed Dx E11.21 05/24/17   Tonia Ghent, MD  buPROPion (WELLBUTRIN) 75 MG tablet Take 1 tablet (75 mg total) by mouth 2 (two) times daily. 07/01/17   Tonia Ghent, MD  cephALEXin (KEFLEX) 250 MG capsule Take 1 capsule (250 mg total) by mouth daily. 07/21/17   Tonia Ghent, MD  doxycycline (VIBRA-TABS) 100 MG tablet Take 1 tablet (100 mg total) by mouth 2 (two) times daily. 07/26/17   Tonia Ghent, MD  ELIQUIS 2.5 MG TABS tablet TAKE 1 TABLET BY MOUTH TWICE DAILY 07/18/17   Bensimhon, Shaune Pascal, MD  fluticasone [facility]) 50 MCG/ACT nasal spray Place 1 spray into both nostrils daily as needed for allergies or rhinitis.    [provider]  glimepiride (AMARYL) 1 MG tablet TAKE '1MG'$  BY MOUTH EVERY MORNING WITH BREAKFAST 05/04/17   Tonia Ghent, MD  glucose blood (ONE TOUCH ULTRA TEST) test strip CHECK BLOOD SUGAR TWICE A DAY AND AS DIRECTED;DX E11.21 05/24/17   Tonia Ghent, MD  Insulin Glargine (LANTUS SOLOSTAR) 100 UNIT/ML Solostar Pen  Inject 20 Units into the skin at bedtime. 07/26/17   Tonia Ghent, MD  Insulin Pen Needle (PEN NEEDLES) 30G X 5 MM MISC Inject 1 Dose as directed daily. Use with insulin pen 01/27/17   Tonia Ghent, MD  loratadine (CLARITIN CHILDRENS) 5 MG chewable tablet Chew 1 tablet (5 mg total) by mouth every evening. 07/26/17   Tonia Ghent, MD  metoprolol succinate (TOPROL-XL) 25 MG 24 hr tablet Take 1 tablet (25 mg total)  by mouth 2 (two) times daily. 04/05/17   Velvet Bathe, MD  Multiple Vitamins-Minerals (ONE-A-DAY 50 PLUS PO) Take 1 capsule by mouth daily.    [provider]  pantoprazole (PROTONIX) 40 MG tablet TAKE '40MG'$  BY MOUTH DAILY 05/04/17   Tonia Ghent, MD  senna-docusate (SENOKOT-S) 8.6-50 MG tablet Take 0.5 tablets by mouth daily. 07/26/17   Tonia Ghent, MD  simethicone (GAS RELIEF) 80 MG chewable tablet CHEW 80 MG BY MOUTH EVERY 6 HOURS AS NEEDED FOR GAS 07/26/17   Tonia Ghent, MD  torsemide (DEMADEX) 20 MG tablet Take 4 tablets (80 mg total) by mouth daily. 07/12/17   Larey Dresser, MD  traZODone (DESYREL) 50 MG tablet Take 0.5-1 tablets (25-50 mg total) by mouth at bedtime as needed for sleep. Don't take >'50mg'$  in a day. 01/21/16   Tonia Ghent, MD    Family History Family History  Problem Relation Age of Onset  . Breast cancer Mother   . Cancer Mother   . Diabetes Daughter   . Hypertension Daughter   . Cancer Brother     Social History Social History  Substance Use Topics  . Smoking status: Never Smoker  . Smokeless tobacco: Never Used  . Alcohol use No     Allergies   Beta adrenergic blockers; Jardiance [empagliflozin]; Lexapro [escitalopram oxalate]; Spironolactone; and Zoloft [sertraline hcl]   Review of Systems Review of Systems  All other systems reviewed and are negative.    Physical Exam Updated Vital Signs BP (!) 127/55   Pulse 73   Temp (!) 97.3 F (36.3 C) (Rectal)   Resp 15   Ht 1.575 m ('5\' 2"'$ )   Wt 80.8 kg (178  lb 3.2 oz)   SpO2 99%   BMI 32.59 kg/m   Physical Exam  Constitutional: She is oriented to person, place, and time. She appears well-developed.  HENT:  Head: Normocephalic.  Contusion left cheek  Eyes: Pupils are equal, round, and reactive to light. EOM are normal.  Neck: Normal range of motion. Neck supple.  Cardiovascular: Normal rate and regular rhythm.   Pulmonary/Chest: Effort normal and breath sounds normal.      Abdominal: Soft. Bowel sounds are normal.  Musculoskeletal: Normal range of motion. She exhibits deformity.       Arms: Multiple contusions different ages extremities with largest contusion left upper arm Left wrist with deformity- radial pulse intact, finger pale but with good cap refill sensation intact and able to move fingers.  Skin is intact bilateral small knee contusions-bilateral lower extremties ranged without c.o. Pain Spine palpated without ttp or step off  Neurological: She is alert and oriented to person, place, and time. No cranial nerve deficit. Coordination normal.  Skin: Skin is warm and dry. Capillary refill takes less than 2 seconds.  Psychiatric: She has a normal mood and affect. Her behavior is normal.  Nursing note and vitals reviewed.    ED Treatments / Results  Labs (all labs ordered are listed, but only abnormal results are displayed) Labs Reviewed  CBC WITH DIFFERENTIAL/PLATELET - Abnormal; Notable for the following:       Result Value   WBC 12.4 (*)    RBC 2.99 (*)    Hemoglobin 9.1 (*)    HCT 28.6 (*)    RDW 22.6 (*)    Platelets 135 (*)    Neutro Abs 8.5 (*)    Monocytes Absolute 1.4 (*)    All other components within normal limits  OCCULT BLOOD X 1 CARD TO LAB, STOOL  URINALYSIS, ROUTINE W REFLEX MICROSCOPIC    EKG  EKG Interpretation None       Radiology Dg Wrist Complete Left  Result Date: 07/29/2017 CLINICAL DATA:  83 year old female with fall and left wrist injury. EXAM: LEFT WRIST - COMPLETE 3+ VIEW  COMPARISON:  None. FINDINGS: There is a displaced transverse fracture of the distal ulna. There is a comminuted appearing transverse fracture of the distal radial metaphysis with mild dorsal angulation and approximately 1/2 shaft width dorsal displacement. No definite intra-articular extension identified. There is no dislocation. The bones are osteopenic. There are degenerative changes of the carpal joints and osteoarthritic changes of the base of the thumb. There is diffuse soft tissue swelling of the wrist. IMPRESSION: Dorsally displaced and angulated fracture of the distal radial metaphysis as well as mildly displaced fracture of the distal ulna. Electronically Signed   By: Anner Crete M.D.   On: 08/02/2017 21:58   Dg Chest Port 1 View  Result Date: 07/18/2017 CLINICAL DATA:  Chest contusion EXAM: PORTABLE CHEST 1 VIEW COMPARISON:  07/08/2017, 5 7,018 FINDINGS: Very low lung volumes. Patchy atelectasis or infiltrate at the left base. No large effusion. Stable enlarged cardiomediastinal silhouette. No pneumothorax. Right-sided pacing device as before. IMPRESSION: Low lung volumes with patchy atelectasis or infiltrate at the left lung base. Cardiomegaly. Electronically Signed   By: Donavan Foil M.D.   On: 08/05/2017 23:17    Procedures Procedures (including critical care time)  Medications Ordered in ED Medications  sodium chloride 0.9 % bolus 500 mL (500 mLs Intravenous New Bag/Given 07/13/2017 2243)  morphine 4 MG/ML injection 1 mg (not administered)     Initial Impression / Assessment and Plan / ED Course  I have reviewed the triage vital signs and the nursing notes.  Pertinent labs & imaging results that were available during my care of the patient were reviewed by me and considered in my medical decision making (see chart for details).    1- hcap-- blood cultures ordered , lactic acid pending, maxipime and vanc ordered  2- left wrist fracture- d.w Dr. Doreatha Martin and he will see in am,    3-anemia--stable 4- gi bleed- hemocult positive no gross bleeding or co or gi bleeding-  5-cmet pending 6- head ct pending. 7- chronic anticoagulation  Discussed with Danielle Benitez and plan admission to hospitalist  Vitals:   07/14/2017 2300 08/08/17 0000  BP: 121/61 109/62  Pulse: 74 70  Resp: 17 17  Temp:    SpO2: 97% 97%     Final Clinical Impressions(s) / ED Diagnoses   Final diagnoses:  HCAP (healthcare-associated pneumonia)  Closed fracture of left wrist, initial encounter    New Prescriptions New Prescriptions   No medications on file     Pattricia Boss, MD 08/08/17 0012

## 2017-08-07 NOTE — ED Notes (Signed)
Patient transported to CT 

## 2017-08-07 NOTE — ED Notes (Signed)
ED Provider at bedside. Ray, MD 

## 2017-08-07 NOTE — ED Triage Notes (Signed)
Pt had multiple falls recently today she was walking with her rolling walker out side the house and lost control on a small hill and fell, pt having left wrist 9/10 pain, wrist braise applied by family pta to ED.

## 2017-08-08 ENCOUNTER — Telehealth: Payer: Self-pay | Admitting: Family Medicine

## 2017-08-08 ENCOUNTER — Encounter (HOSPITAL_COMMUNITY): Payer: Self-pay | Admitting: Family Medicine

## 2017-08-08 ENCOUNTER — Other Ambulatory Visit: Payer: Self-pay

## 2017-08-08 ENCOUNTER — Inpatient Hospital Stay (HOSPITAL_COMMUNITY): Payer: Medicare Other

## 2017-08-08 ENCOUNTER — Emergency Department (HOSPITAL_COMMUNITY): Payer: Medicare Other

## 2017-08-08 DIAGNOSIS — W19XXXS Unspecified fall, sequela: Secondary | ICD-10-CM | POA: Diagnosis not present

## 2017-08-08 DIAGNOSIS — Y9301 Activity, walking, marching and hiking: Secondary | ICD-10-CM | POA: Diagnosis present

## 2017-08-08 DIAGNOSIS — W19XXXA Unspecified fall, initial encounter: Secondary | ICD-10-CM | POA: Diagnosis not present

## 2017-08-08 DIAGNOSIS — Z87440 Personal history of urinary (tract) infections: Secondary | ICD-10-CM | POA: Diagnosis not present

## 2017-08-08 DIAGNOSIS — S62002A Unspecified fracture of navicular [scaphoid] bone of left wrist, initial encounter for closed fracture: Secondary | ICD-10-CM | POA: Diagnosis not present

## 2017-08-08 DIAGNOSIS — D649 Anemia, unspecified: Secondary | ICD-10-CM | POA: Diagnosis not present

## 2017-08-08 DIAGNOSIS — R1310 Dysphagia, unspecified: Secondary | ICD-10-CM | POA: Diagnosis not present

## 2017-08-08 DIAGNOSIS — I10 Essential (primary) hypertension: Secondary | ICD-10-CM

## 2017-08-08 DIAGNOSIS — F329 Major depressive disorder, single episode, unspecified: Secondary | ICD-10-CM

## 2017-08-08 DIAGNOSIS — R945 Abnormal results of liver function studies: Secondary | ICD-10-CM | POA: Diagnosis not present

## 2017-08-08 DIAGNOSIS — Z9181 History of falling: Secondary | ICD-10-CM | POA: Diagnosis not present

## 2017-08-08 DIAGNOSIS — K219 Gastro-esophageal reflux disease without esophagitis: Secondary | ICD-10-CM | POA: Diagnosis present

## 2017-08-08 DIAGNOSIS — R195 Other fecal abnormalities: Secondary | ICD-10-CM | POA: Diagnosis present

## 2017-08-08 DIAGNOSIS — I132 Hypertensive heart and chronic kidney disease with heart failure and with stage 5 chronic kidney disease, or end stage renal disease: Secondary | ICD-10-CM | POA: Diagnosis present

## 2017-08-08 DIAGNOSIS — E1165 Type 2 diabetes mellitus with hyperglycemia: Secondary | ICD-10-CM

## 2017-08-08 DIAGNOSIS — Z833 Family history of diabetes mellitus: Secondary | ICD-10-CM | POA: Diagnosis not present

## 2017-08-08 DIAGNOSIS — J189 Pneumonia, unspecified organism: Secondary | ICD-10-CM | POA: Diagnosis present

## 2017-08-08 DIAGNOSIS — Y95 Nosocomial condition: Secondary | ICD-10-CM | POA: Diagnosis present

## 2017-08-08 DIAGNOSIS — I4891 Unspecified atrial fibrillation: Secondary | ICD-10-CM | POA: Diagnosis not present

## 2017-08-08 DIAGNOSIS — I5032 Chronic diastolic (congestive) heart failure: Secondary | ICD-10-CM | POA: Diagnosis not present

## 2017-08-08 DIAGNOSIS — Z515 Encounter for palliative care: Secondary | ICD-10-CM | POA: Diagnosis not present

## 2017-08-08 DIAGNOSIS — Z7189 Other specified counseling: Secondary | ICD-10-CM | POA: Diagnosis not present

## 2017-08-08 DIAGNOSIS — Z950 Presence of cardiac pacemaker: Secondary | ICD-10-CM | POA: Diagnosis not present

## 2017-08-08 DIAGNOSIS — K922 Gastrointestinal hemorrhage, unspecified: Secondary | ICD-10-CM | POA: Diagnosis present

## 2017-08-08 DIAGNOSIS — R188 Other ascites: Secondary | ICD-10-CM | POA: Diagnosis not present

## 2017-08-08 DIAGNOSIS — R112 Nausea with vomiting, unspecified: Secondary | ICD-10-CM | POA: Diagnosis not present

## 2017-08-08 DIAGNOSIS — Y9289 Other specified places as the place of occurrence of the external cause: Secondary | ICD-10-CM | POA: Diagnosis not present

## 2017-08-08 DIAGNOSIS — D631 Anemia in chronic kidney disease: Secondary | ICD-10-CM | POA: Diagnosis present

## 2017-08-08 DIAGNOSIS — S62102A Fracture of unspecified carpal bone, left wrist, initial encounter for closed fracture: Secondary | ICD-10-CM | POA: Diagnosis not present

## 2017-08-08 DIAGNOSIS — K56609 Unspecified intestinal obstruction, unspecified as to partial versus complete obstruction: Secondary | ICD-10-CM | POA: Diagnosis not present

## 2017-08-08 DIAGNOSIS — R1011 Right upper quadrant pain: Secondary | ICD-10-CM | POA: Diagnosis not present

## 2017-08-08 DIAGNOSIS — N19 Unspecified kidney failure: Secondary | ICD-10-CM | POA: Diagnosis not present

## 2017-08-08 DIAGNOSIS — E861 Hypovolemia: Secondary | ICD-10-CM | POA: Diagnosis not present

## 2017-08-08 DIAGNOSIS — I959 Hypotension, unspecified: Secondary | ICD-10-CM | POA: Diagnosis not present

## 2017-08-08 DIAGNOSIS — K3184 Gastroparesis: Secondary | ICD-10-CM | POA: Diagnosis not present

## 2017-08-08 DIAGNOSIS — E1129 Type 2 diabetes mellitus with other diabetic kidney complication: Secondary | ICD-10-CM

## 2017-08-08 DIAGNOSIS — E785 Hyperlipidemia, unspecified: Secondary | ICD-10-CM | POA: Diagnosis present

## 2017-08-08 DIAGNOSIS — S4992XA Unspecified injury of left shoulder and upper arm, initial encounter: Secondary | ICD-10-CM | POA: Diagnosis not present

## 2017-08-08 DIAGNOSIS — Z7901 Long term (current) use of anticoagulants: Secondary | ICD-10-CM | POA: Diagnosis not present

## 2017-08-08 DIAGNOSIS — R51 Headache: Secondary | ICD-10-CM | POA: Diagnosis not present

## 2017-08-08 DIAGNOSIS — N184 Chronic kidney disease, stage 4 (severe): Secondary | ICD-10-CM | POA: Diagnosis not present

## 2017-08-08 DIAGNOSIS — I2510 Atherosclerotic heart disease of native coronary artery without angina pectoris: Secondary | ICD-10-CM | POA: Diagnosis not present

## 2017-08-08 DIAGNOSIS — F411 Generalized anxiety disorder: Secondary | ICD-10-CM | POA: Diagnosis present

## 2017-08-08 DIAGNOSIS — D696 Thrombocytopenia, unspecified: Secondary | ICD-10-CM | POA: Diagnosis not present

## 2017-08-08 DIAGNOSIS — Z8249 Family history of ischemic heart disease and other diseases of the circulatory system: Secondary | ICD-10-CM | POA: Diagnosis not present

## 2017-08-08 DIAGNOSIS — G9341 Metabolic encephalopathy: Secondary | ICD-10-CM | POA: Diagnosis not present

## 2017-08-08 DIAGNOSIS — N179 Acute kidney failure, unspecified: Secondary | ICD-10-CM | POA: Diagnosis not present

## 2017-08-08 DIAGNOSIS — K7460 Unspecified cirrhosis of liver: Secondary | ICD-10-CM | POA: Diagnosis not present

## 2017-08-08 DIAGNOSIS — M25512 Pain in left shoulder: Secondary | ICD-10-CM | POA: Diagnosis not present

## 2017-08-08 DIAGNOSIS — Z8673 Personal history of transient ischemic attack (TIA), and cerebral infarction without residual deficits: Secondary | ICD-10-CM | POA: Diagnosis not present

## 2017-08-08 DIAGNOSIS — I252 Old myocardial infarction: Secondary | ICD-10-CM | POA: Diagnosis not present

## 2017-08-08 DIAGNOSIS — N185 Chronic kidney disease, stage 5: Secondary | ICD-10-CM | POA: Diagnosis present

## 2017-08-08 DIAGNOSIS — D509 Iron deficiency anemia, unspecified: Secondary | ICD-10-CM | POA: Diagnosis present

## 2017-08-08 DIAGNOSIS — K5900 Constipation, unspecified: Secondary | ICD-10-CM | POA: Diagnosis not present

## 2017-08-08 DIAGNOSIS — S52592A Other fractures of lower end of left radius, initial encounter for closed fracture: Secondary | ICD-10-CM | POA: Diagnosis present

## 2017-08-08 DIAGNOSIS — E1122 Type 2 diabetes mellitus with diabetic chronic kidney disease: Secondary | ICD-10-CM | POA: Diagnosis present

## 2017-08-08 DIAGNOSIS — S52602A Unspecified fracture of lower end of left ulna, initial encounter for closed fracture: Secondary | ICD-10-CM | POA: Diagnosis present

## 2017-08-08 DIAGNOSIS — S52532A Colles' fracture of left radius, initial encounter for closed fracture: Secondary | ICD-10-CM | POA: Diagnosis not present

## 2017-08-08 DIAGNOSIS — W1781XA Fall down embankment (hill), initial encounter: Secondary | ICD-10-CM | POA: Diagnosis not present

## 2017-08-08 LAB — GLUCOSE, CAPILLARY
GLUCOSE-CAPILLARY: 226 mg/dL — AB (ref 65–99)
GLUCOSE-CAPILLARY: 145 mg/dL — AB (ref 65–99)
GLUCOSE-CAPILLARY: 103 mg/dL — AB (ref 65–99)
Glucose-Capillary: 104 mg/dL — ABNORMAL HIGH (ref 65–99)
Glucose-Capillary: 99 mg/dL (ref 65–99)

## 2017-08-08 LAB — TYPE AND SCREEN
ABO/RH(D): A NEG
Antibody Screen: NEGATIVE

## 2017-08-08 LAB — PROCALCITONIN: PROCALCITONIN: 0.22 ng/mL

## 2017-08-08 LAB — CBC WITH DIFFERENTIAL/PLATELET
Basophils Absolute: 0 10*3/uL (ref 0.0–0.1)
Basophils Relative: 0 %
EOS PCT: 1 %
Eosinophils Absolute: 0.1 10*3/uL (ref 0.0–0.7)
HEMATOCRIT: 24.1 % — AB (ref 36.0–46.0)
Hemoglobin: 7.6 g/dL — ABNORMAL LOW (ref 12.0–15.0)
LYMPHS ABS: 2.2 10*3/uL (ref 0.7–4.0)
Lymphocytes Relative: 24 %
MCH: 29.7 pg (ref 26.0–34.0)
MCHC: 31.5 g/dL (ref 30.0–36.0)
MCV: 94.1 fL (ref 78.0–100.0)
Monocytes Absolute: 0.7 10*3/uL (ref 0.1–1.0)
Monocytes Relative: 8 %
NEUTROS ABS: 6.2 10*3/uL (ref 1.7–7.7)
Neutrophils Relative %: 67 %
PLATELETS: 67 10*3/uL — AB (ref 150–400)
RBC: 2.56 MIL/uL — AB (ref 3.87–5.11)
RDW: 21.5 % — AB (ref 11.5–15.5)
WBC: 9.2 10*3/uL (ref 4.0–10.5)

## 2017-08-08 LAB — COMPREHENSIVE METABOLIC PANEL
ALBUMIN: 1.9 g/dL — AB (ref 3.5–5.0)
ALK PHOS: 191 U/L — AB (ref 38–126)
ALT: 34 U/L (ref 14–54)
ALT: 29 U/L (ref 14–54)
ANION GAP: 12 (ref 5–15)
AST: 88 U/L — ABNORMAL HIGH (ref 15–41)
AST: 81 U/L — AB (ref 15–41)
Albumin: 2.2 g/dL — ABNORMAL LOW (ref 3.5–5.0)
Alkaline Phosphatase: 158 U/L — ABNORMAL HIGH (ref 38–126)
Anion gap: 9 (ref 5–15)
BILIRUBIN TOTAL: 2.5 mg/dL — AB (ref 0.3–1.2)
BUN: 60 mg/dL — ABNORMAL HIGH (ref 6–20)
BUN: 60 mg/dL — AB (ref 6–20)
CALCIUM: 8.4 mg/dL — AB (ref 8.9–10.3)
CHLORIDE: 101 mmol/L (ref 101–111)
CHLORIDE: 99 mmol/L — AB (ref 101–111)
CO2: 26 mmol/L (ref 22–32)
CO2: 27 mmol/L (ref 22–32)
CREATININE: 3.51 mg/dL — AB (ref 0.44–1.00)
Calcium: 7.8 mg/dL — ABNORMAL LOW (ref 8.9–10.3)
Creatinine, Ser: 3.76 mg/dL — ABNORMAL HIGH (ref 0.44–1.00)
GFR calc Af Amer: 13 mL/min — ABNORMAL LOW (ref >60)
GFR, EST AFRICAN AMERICAN: 12 mL/min — AB (ref >60)
GFR, EST NON AFRICAN AMERICAN: 10 mL/min — AB (ref >60)
GFR, EST NON AFRICAN AMERICAN: 11 mL/min — AB (ref >60)
GLUCOSE: 284 mg/dL — AB (ref 65–99)
Glucose, Bld: 243 mg/dL — ABNORMAL HIGH (ref 65–99)
Potassium: 3.9 mmol/L (ref 3.5–5.1)
Potassium: 4.1 mmol/L (ref 3.5–5.1)
SODIUM: 138 mmol/L (ref 135–145)
Sodium: 136 mmol/L (ref 135–145)
Total Bilirubin: 3 mg/dL — ABNORMAL HIGH (ref 0.3–1.2)
Total Protein: 6.5 g/dL (ref 6.5–8.1)
Total Protein: 5.3 g/dL — ABNORMAL LOW (ref 6.5–8.1)

## 2017-08-08 LAB — STREP PNEUMONIAE URINARY ANTIGEN: STREP PNEUMO URINARY ANTIGEN: NEGATIVE

## 2017-08-08 LAB — SODIUM, URINE, RANDOM: Sodium, Ur: 24 mmol/L

## 2017-08-08 LAB — LACTIC ACID, PLASMA
LACTIC ACID, VENOUS: 2 mmol/L — AB (ref 0.5–1.9)
LACTIC ACID, VENOUS: 2.7 mmol/L — AB (ref 0.5–1.9)

## 2017-08-08 LAB — CREATININE, URINE, RANDOM: Creatinine, Urine: 79.97 mg/dL

## 2017-08-08 LAB — I-STAT CG4 LACTIC ACID, ED: Lactic Acid, Venous: 2.53 mmol/L (ref 0.5–1.9)

## 2017-08-08 MED ORDER — SODIUM CHLORIDE 0.9% FLUSH
3.0000 mL | Freq: Two times a day (BID) | INTRAVENOUS | Status: DC
  Administered 2017-08-08 – 2017-08-19 (×9): 3 mL via INTRAVENOUS

## 2017-08-08 MED ORDER — ONDANSETRON HCL 4 MG/2ML IJ SOLN
4.0000 mg | Freq: Four times a day (QID) | INTRAMUSCULAR | Status: DC | PRN
  Administered 2017-08-09 – 2017-08-10 (×2): 4 mg via INTRAVENOUS
  Filled 2017-08-08 (×2): qty 2

## 2017-08-08 MED ORDER — TRAZODONE HCL 50 MG PO TABS
25.0000 mg | ORAL_TABLET | Freq: Every evening | ORAL | Status: DC | PRN
  Administered 2017-08-10: 50 mg via ORAL
  Filled 2017-08-08: qty 1

## 2017-08-08 MED ORDER — SODIUM CHLORIDE 0.9 % IV SOLN: 250.0000 mL | INTRAVENOUS | Status: DC | PRN

## 2017-08-08 MED ORDER — VANCOMYCIN HCL IN DEXTROSE 1-5 GM/200ML-% IV SOLN
1000.0000 mg | Freq: Once | INTRAVENOUS | Status: AC
  Administered 2017-08-08: 1000 mg via INTRAVENOUS
  Filled 2017-08-08: qty 200

## 2017-08-08 MED ORDER — MORPHINE SULFATE (PF) 4 MG/ML IV SOLN
2.0000 mg | INTRAVENOUS | Status: DC | PRN
  Administered 2017-08-08 – 2017-08-14 (×2): 2 mg via INTRAVENOUS
  Filled 2017-08-08 (×2): qty 1

## 2017-08-08 MED ORDER — BUPROPION HCL 75 MG PO TABS
75.0000 mg | ORAL_TABLET | Freq: Two times a day (BID) | ORAL | Status: DC
  Administered 2017-08-08 – 2017-08-13 (×11): 75 mg via ORAL
  Filled 2017-08-08 (×15): qty 1

## 2017-08-08 MED ORDER — PANTOPRAZOLE SODIUM 40 MG PO TBEC
40.0000 mg | DELAYED_RELEASE_TABLET | Freq: Every day | ORAL | Status: DC
  Administered 2017-08-08 – 2017-08-13 (×6): 40 mg via ORAL
  Filled 2017-08-08 (×7): qty 1

## 2017-08-08 MED ORDER — SIMETHICONE 80 MG PO CHEW: 80.0000 mg | CHEWABLE_TABLET | Freq: Four times a day (QID) | ORAL | Status: DC | PRN

## 2017-08-08 MED ORDER — ALLOPURINOL 300 MG PO TABS
300.0000 mg | ORAL_TABLET | Freq: Every day | ORAL | Status: DC
  Administered 2017-08-08: 300 mg via ORAL
  Filled 2017-08-08: qty 1

## 2017-08-08 MED ORDER — SENNOSIDES-DOCUSATE SODIUM 8.6-50 MG PO TABS
0.5000 | ORAL_TABLET | Freq: Every day | ORAL | Status: DC
  Administered 2017-08-08 – 2017-08-12 (×5): 0.5 via ORAL
  Filled 2017-08-08 (×7): qty 1

## 2017-08-08 MED ORDER — ACETAMINOPHEN 650 MG RE SUPP
650.0000 mg | Freq: Four times a day (QID) | RECTAL | Status: DC | PRN
Start: 2017-08-08 — End: 2017-08-15

## 2017-08-08 MED ORDER — ACETAMINOPHEN 325 MG PO TABS: 650.0000 mg | ORAL_TABLET | Freq: Four times a day (QID) | ORAL | Status: DC | PRN

## 2017-08-08 MED ORDER — DEXTROSE 5 % IV SOLN
1.0000 g | INTRAVENOUS | Status: DC
  Administered 2017-08-08 – 2017-08-14 (×7): 1 g via INTRAVENOUS
  Filled 2017-08-08 (×7): qty 1

## 2017-08-08 MED ORDER — BISACODYL 5 MG PO TBEC: 5.0000 mg | DELAYED_RELEASE_TABLET | Freq: Every day | ORAL | Status: DC | PRN

## 2017-08-08 MED ORDER — SODIUM CHLORIDE 0.9% FLUSH: 3.0000 mL | INTRAVENOUS | Status: DC | PRN

## 2017-08-08 MED ORDER — FLUTICASONE PROPIONATE 50 MCG/ACT NA SUSP
1.0000 | Freq: Every day | NASAL | Status: DC | PRN
  Filled 2017-08-08: qty 16

## 2017-08-08 MED ORDER — ATORVASTATIN CALCIUM 20 MG PO TABS
20.0000 mg | ORAL_TABLET | Freq: Every day | ORAL | Status: DC
  Administered 2017-08-08 – 2017-08-13 (×5): 20 mg via ORAL
  Filled 2017-08-08 (×6): qty 1

## 2017-08-08 MED ORDER — INSULIN ASPART 100 UNIT/ML ~~LOC~~ SOLN
0.0000 [IU] | SUBCUTANEOUS | Status: DC
  Administered 2017-08-08: 3 [IU] via SUBCUTANEOUS
  Administered 2017-08-09 – 2017-08-10 (×3): 1 [IU] via SUBCUTANEOUS
  Administered 2017-08-13 – 2017-08-14 (×4): 2 [IU] via SUBCUTANEOUS
  Administered 2017-08-14: 1 [IU] via SUBCUTANEOUS

## 2017-08-08 MED ORDER — AMIODARONE HCL 100 MG PO TABS
200.0000 mg | ORAL_TABLET | Freq: Every day | ORAL | Status: DC
  Administered 2017-08-08 – 2017-08-13 (×6): 200 mg via ORAL
  Filled 2017-08-08 (×7): qty 2

## 2017-08-08 MED ORDER — DEXTROSE 5 % IV SOLN
2.0000 g | Freq: Once | INTRAVENOUS | Status: AC
  Administered 2017-08-08: 2 g via INTRAVENOUS
  Filled 2017-08-08: qty 2

## 2017-08-08 MED ORDER — DEXTROSE 50 % IV SOLN
INTRAVENOUS | Status: AC
  Administered 2017-08-08: 25 mL
  Filled 2017-08-08: qty 50

## 2017-08-08 MED ORDER — LORATADINE 10 MG PO TABS
5.0000 mg | ORAL_TABLET | Freq: Every evening | ORAL | Status: DC
  Administered 2017-08-08 – 2017-08-13 (×5): 5 mg via ORAL
  Filled 2017-08-08 (×6): qty 1

## 2017-08-08 MED ORDER — DEXTROSE 50 % IV SOLN
25.0000 mL | Freq: Once | INTRAVENOUS | Status: AC
  Administered 2017-08-10: 25 mL via INTRAVENOUS
  Filled 2017-08-08: qty 50

## 2017-08-08 MED ORDER — ONDANSETRON HCL 4 MG PO TABS: 4.0000 mg | ORAL_TABLET | Freq: Four times a day (QID) | ORAL | Status: DC | PRN

## 2017-08-08 MED ORDER — SENNOSIDES-DOCUSATE SODIUM 8.6-50 MG PO TABS: 1.0000 | ORAL_TABLET | Freq: Every evening | ORAL | Status: DC | PRN

## 2017-08-08 MED ORDER — INSULIN GLARGINE 100 UNIT/ML ~~LOC~~ SOLN
20.0000 [IU] | Freq: Every day | SUBCUTANEOUS | Status: DC
  Administered 2017-08-09 – 2017-08-10 (×2): 20 [IU] via SUBCUTANEOUS
  Filled 2017-08-08 (×5): qty 0.2

## 2017-08-08 MED ORDER — VANCOMYCIN HCL IN DEXTROSE 1-5 GM/200ML-% IV SOLN
1000.0000 mg | INTRAVENOUS | Status: DC
  Administered 2017-08-10 – 2017-08-12 (×2): 1000 mg via INTRAVENOUS
  Filled 2017-08-08 (×2): qty 200

## 2017-08-08 MED ORDER — HYDROCODONE-ACETAMINOPHEN 5-325 MG PO TABS
1.0000 | ORAL_TABLET | ORAL | Status: DC | PRN
  Administered 2017-08-08 – 2017-08-11 (×8): 1 via ORAL
  Administered 2017-08-12: 2 via ORAL
  Administered 2017-08-13 (×2): 1 via ORAL
  Filled 2017-08-08: qty 1
  Filled 2017-08-08: qty 2
  Filled 2017-08-08 (×4): qty 1
  Filled 2017-08-08: qty 2
  Filled 2017-08-08 (×4): qty 1

## 2017-08-08 MED ORDER — METOPROLOL SUCCINATE ER 25 MG PO TB24
25.0000 mg | ORAL_TABLET | Freq: Two times a day (BID) | ORAL | Status: DC
  Administered 2017-08-08 – 2017-08-11 (×7): 25 mg via ORAL
  Filled 2017-08-08 (×7): qty 1

## 2017-08-08 MED ORDER — APIXABAN 2.5 MG PO TABS: 2.5000 mg | ORAL_TABLET | Freq: Two times a day (BID) | ORAL | Status: DC

## 2017-08-08 MED ORDER — SODIUM CHLORIDE 0.9 % IV SOLN: 500.0000 mg | INTRAVENOUS | Status: DC

## 2017-08-08 NOTE — Progress Notes (Signed)
Pharmacy Antibiotic Note  Danielle Benitez is a 83 y.o. female with CKD admitted on 20-Aug-2017 after fall, found to have HCAP.  Pharmacy has been consulted for Vancomycin dosing.  Plan: Vancomycin 1000 mg IV x 1, then 500 mg IV q48h  Height: 5\' 2"  (157.5 cm) Weight: 178 lb 3.2 oz (80.8 kg) IBW/kg (Calculated) : 50.1  Temp (24hrs), Avg:97.6 F (36.4 C), Min:97.3 F (36.3 C), Max:97.9 F (36.6 C)   Recent Labs Lab 20-Aug-2017 2231 08/08/17 0043 08/08/17 0058  WBC 12.4*  --   --   CREATININE  --  3.76*  --   LATICACIDVEN  --   --  2.53*    Estimated Creatinine Clearance: 11.2 mL/min (A) (by C-G formula based on SCr of 3.76 mg/dL (H)).    Allergies  Allergen Reactions  . Beta Adrenergic Blockers Other (See Comments)    Couldn't speak or hear Metoprolol seems ok  . Jardiance [Empagliflozin] Other (See Comments)    Rash, kidney issues  . Lexapro [Escitalopram Oxalate] Other (See Comments)    Lack of effect.   Marland Kitchen Spironolactone Other (See Comments)    Hyperkalemia  . Zoloft [Sertraline Hcl] Other (See Comments)    Lack of effect.     Eddie Candle 08/08/2017 3:43 AM

## 2017-08-08 NOTE — ED Notes (Signed)
CMP redrawn and sent to lab. One set of cultures drawn at this time.

## 2017-08-08 NOTE — Progress Notes (Signed)
Pharmacy Antibiotic Note  Danielle Benitez is a 83 y.o. female with CKD admitted on 08/13/2017 after fall, found to have HCAP.  Pharmacy has been consulted for vancomycin dosing.  Patient is also on cefepime.  Renal function is starting to improve.  Afebrile, WBC WNL.   Plan: Change vanc to 1gm IV Q48H, x 8 days total Continue cefepime 1gm IV Q24H per MD Monitor renal fxn, micro data, vanc trough as indicated F/U resume Eliquis if indicated   Height: 5\' 2"  (157.5 cm) Weight: 178 lb 3.2 oz (80.8 kg) IBW/kg (Calculated) : 50.1  Temp (24hrs), Avg:97.7 F (36.5 C), Min:97.3 F (36.3 C), Max:98 F (36.7 C)   Recent Labs Lab 08/13/2017 2231 08/08/17 0043 08/08/17 0058 08/08/17 0309 08/08/17 0721  WBC 12.4*  --   --  9.2  --   CREATININE  --  3.76*  --  3.51*  --   LATICACIDVEN  --   --  2.53* 2.0* 2.7*    Estimated Creatinine Clearance: 12 mL/min (A) (by C-G formula based on SCr of 3.51 mg/dL (H)).    Allergies  Allergen Reactions  . Beta Adrenergic Blockers Other (See Comments)    Couldn't speak or hear Metoprolol seems ok  . Jardiance [Empagliflozin] Other (See Comments)    Rash, kidney issues  . Lexapro [Escitalopram Oxalate] Other (See Comments)    Lack of effect.   Marland Kitchen Spironolactone Other (See Comments)    Hyperkalemia  . Zoloft [Sertraline Hcl] Other (See Comments)    Lack of effect.     Vanc 10/29 >> Cefepime 10/29 >>  10/29 BCx -    Danielle Benitez D. Laney Potash, PharmD, BCPS Pager:  [phone] 08/08/2017, 10:48 AM

## 2017-08-08 NOTE — Telephone Encounter (Signed)
Copied from CRM [id number]. Topic: Inquiry >> Aug 08, 2017 12:21 PM Alexander Bergeron B wrote: Reason for CRM: [name] BREWER (DAUGHTER) CALLED on behalf of PT to discuss her meds, the Dr. From the hospital gave her allopurinol for 200mg  but when she just got the prescription today she saw that it had bee prescribed for 300mg  by Dr. Para March please contact PT to make sure that she has the right dose

## 2017-08-08 NOTE — H&P (Signed)
History and Physical    Danielle Benitez ZOX:[id number] DOB: 17-Jul-1932 DOA: 08/09/2017  PCP: Tonia Ghent, MD   Patient coming from: Home  Chief Complaint: Left wrist pain   HPI: Danielle Benitez is a 83 y.o. female with medical history significant for insulin-dependent diabetes mellitus, hypertension, history of CVA, atrial fibrillation on Eliquis, chronic diastolic CHF, and chronic kidney disease stage IV, now presenting to the emergency department with severe left wrist pain after a mechanical fall.  The patient reports being in poor state of health in recent months with recurrent UTI and general malaise.  She was using her walker on a hill today, lost control and fell, landing on an outstretched left arm.  She experienced immediate and severe pain at the left wrist and was brought in for evaluation.  Family reports some mild cough recently.  Denies chest pain or palpitations and denies melena or hematochezia.  There was no head strike or loss of consciousness associated with the fall.  ED Course: Upon arrival to the ED, patient is found to be afebrile, saturating well on room air, and with vitals otherwise stable.  EKG features a paced rhythm with chronic RBBB and chronic LAFB.  Chest x-ray features low lung volumes with patchy atelectasis versus infiltrate in the left base.  Radiographs of the left wrist demonstrate a dorsally displaced and angulated fracture of the distal radial metaphysis and mildly displaced fracture of the distal ulna.  Noncontrast head CT is negative for acute intracranial abnormality or skull fracture.  Chemistry panel reveals a BUN of 60 and creatinine of 3.76, up from 2.36 two weeks ago.  CBC is notable for a leukocytosis 12,400, chronic stable thrombocytopenia with platelets 135,000, and a hemoglobin of 9.1, slightly down from priors.  Lactic acid is elevated to 2.53 and urinalysis is abnormal.  DRE produces brown stool that tests positive for occult blood.  Hand surgery was  consulted by the ED physician and states that they plan to see the patient in the morning.  Patient was treated with 1.5 L of normal saline,, and empiric cefepime and vancomycin.  Blood cultures were collected.  Patient remains hemodynamically stable and has not been in acute respiratory distress.  She will be admitted to the telemetry unit for ongoing evaluation and management of left wrist fracture, pneumonia, acute kidney injury superimposed on CKD stage IV, and occult GI bleeding.  Review of Systems:  All other systems reviewed and apart from HPI, are negative.  Past Medical History:  Diagnosis Date  . Abnormality of gait   . Anemia, unspecified   . Anticoagulated- Eliquis 07/31/2015  . Anxiety state, unspecified   . Aortic stenosis    a. Mild by echo 08/2013, not seen on repeat echo 02/2017.  Marland Kitchen Bifascicular block    afib  . Carotid artery disease (HCC)    a. Carotid dopplers (8/11) with 91-47% LICA stenosis (of note, prior ABI normal).  . Chronic diastolic CHF (congestive heart failure) ([place])    a. Dx 07/2015 - EF 35-40%, unclear etiology ? r/t afib or viral etiology. b. EF normal in 02/2017.  . CKD (chronic kidney disease), stage III ([place])   . Depressive disorder, not elsewhere classified   . DM (diabetes mellitus), type 2, uncontrolled, with renal complications ([place]) 06/08/5620  . Eczema 11/22/2013  . Esophageal dysmotility 08/05/2015  . Essential hypertension   . Female stress incontinence 05/06/2015  . Full dentures   . GERD (gastroesophageal reflux disease)   . Gout   .  HOH (hard of hearing)   . Internal hemorrhoids without mention of complication   . Iron deficiency   . Lumbago   . Memory loss   . Mild CAD    a. Cath 2013: 20-30% prox RCA.  Marland Kitchen Myocardial infarction ([place]) 07/2015  . Nonspecific abnormal results of liver function study   . Obesity, unspecified   . Orthostatic hypotension   . Osteoarthritis   . Other and unspecified hyperlipidemia   . PAF (paroxysmal  atrial fibrillation) ([place]) 07/11/2015   a. prior DCCVs, amiodarone, anticoagulation.  . Pain in joint, shoulder region 11/22/2013   left   . Personal history of fall   . Presence of permanent cardiac pacemaker   . Pruritus 11/22/2013  . S/P cardiac pacemaker procedure 01/11/2012   a. for symptomatic bradycardia. Followed by Dr. Lovena Le. Device changed out 2017 due to ppm infection.  . Stroke [facility])    a. evidence of prior strokes on MRI in 2018.  Marland Kitchen UTI (urinary tract infection) 04/2017  . Wears glasses     Past Surgical History:  Procedure Laterality Date  . CARDIAC CATHETERIZATION  12/03/2003   Dr Rex Kras  . CARDIOVERSION N/A 07/25/2015   Procedure: CARDIOVERSION;  Surgeon: Fay Records, MD;  Location: [facility] ENDOSCOPY;  Service: Cardiovascular;  Laterality: N/A;  . CARDIOVERSION N/A 08/06/2015   Procedure: CARDIOVERSION;  Surgeon: Larey Dresser, MD;  Location: [place];  Service: Cardiovascular;  Laterality: N/A;  . CATARACT EXTRACTION W/ INTRAOCULAR LENS  IMPLANT, BILATERAL Bilateral 03/2015-04/2015  . COLONOSCOPY     ?????  . EP IMPLANTABLE DEVICE N/A 04/15/2016   Procedure: Pacemaker Implant;  Surgeon: Evans Lance, MD;  Location: [place] CV LAB;  Service: Cardiovascular;  Laterality: N/A;  . ESOPHAGOGASTRODUODENOSCOPY N/A 08/08/2015   Procedure: ESOPHAGOGASTRODUODENOSCOPY (EGD);  Surgeon: Gatha Mayer, MD;  Location: [facility] ENDOSCOPY;  Service: Endoscopy;  Laterality: N/A;  . EXTREMITY CYST EXCISION     finger  . EYE SURGERY Bilateral    catartact  . INSERT / REPLACE / REMOVE PACEMAKER    . LEFT HEART CATHETERIZATION WITH CORONARY ANGIOGRAM N/A 01/10/2012   Procedure: LEFT HEART CATHETERIZATION WITH CORONARY ANGIOGRAM;  Surgeon: Pixie Casino, MD;  Location: [facility] CATH LAB;  Service: Cardiovascular;  Laterality: N/A;  . ORIF WRIST FRACTURE Right 04/09/2014   Procedure: OPEN REDUCTION INTERNAL FIXATION (ORIF) RIGHT DISPLACED  DISTAL RADIUS  FRACTURE;  Surgeon: Roseanne Kaufman, MD;   Location: [place];  Service: Orthopedics;  Laterality: Right;  . PACEMAKER LEAD REMOVAL  04/12/2016   TEMPORARY PACEMAKER INSERTION  . PACEMAKER LEAD REMOVAL  04/15/2016   Procedure: Pacemaker Lead Removal;  Surgeon: Evans Lance, MD;  Location: [place] CV LAB;  Service: Cardiovascular;;  . PACEMAKER LEAD REMOVAL N/A 04/12/2016   Procedure: PACEMAKER LEAD REMOVAL;  Surgeon: Evans Lance, MD;  Location: [place];  Service: Cardiovascular;  Laterality: N/A;  . PERMANENT PACEMAKER INSERTION Left 01/11/2012   Procedure: PERMANENT PACEMAKER INSERTION;  Surgeon: Sanda Klein, MD;  Location: [place] CATH LAB;  Service: Cardiovascular;  Laterality: Left;  . TEE WITHOUT CARDIOVERSION N/A 07/25/2015   Procedure: TRANSESOPHAGEAL ECHOCARDIOGRAM (TEE);  Surgeon: Fay Records, MD;  Location: [facility] ENDOSCOPY;  Service: Cardiovascular;  Laterality: N/A;     reports that she has never smoked. She has never used smokeless tobacco. She reports that she does not drink alcohol or use drugs.  Allergies  Allergen Reactions  . Beta Adrenergic Blockers Other (See Comments)    Couldn't  speak or hear Metoprolol seems ok  . Jardiance [Empagliflozin] Other (See Comments)    Rash, kidney issues  . Lexapro [Escitalopram Oxalate] Other (See Comments)    Lack of effect.   Marland Kitchen Spironolactone Other (See Comments)    Hyperkalemia  . Zoloft [Sertraline Hcl] Other (See Comments)    Lack of effect.     Family History  Problem Relation Age of Onset  . Breast cancer Mother   . Cancer Mother   . Diabetes Daughter   . Hypertension Daughter   . Cancer Brother      Prior to Admission medications   Medication Sig Start Date End Date Taking? Authorizing Provider  albuterol (PROVENTIL HFA;VENTOLIN HFA) 108 (90 Base) MCG/ACT inhaler Inhale 2 puffs into the lungs every 4 (four) hours as needed for wheezing or shortness of breath (cough, shortness of breath or wheezing.). 10/15/16   Elby Beck, FNP  allopurinol  (ZYLOPRIM) 100 MG tablet Take 2 tablets (200 mg total) by mouth daily. 07/09/17   Aline August, MD  allopurinol (ZYLOPRIM) 300 MG tablet TAKE 1 TABLET BY MOUTH DAILY 08/03/17   Tonia Ghent, MD  amiodarone (PACERONE) 200 MG tablet TAKE '200MG'$  BY MOUTH DAILY 05/04/17   Tonia Ghent, MD  atorvastatin (LIPITOR) 20 MG tablet Take 1 tablet (20 mg total) by mouth daily at 6 PM. 04/06/17   Velvet Bathe, MD  Blood Glucose Monitoring Suppl (ONE TOUCH ULTRA 2) w/Device KIT Check blood sugar twice a day and as directed Dx E11.21 05/24/17   Tonia Ghent, MD  buPROPion (WELLBUTRIN) 75 MG tablet Take 1 tablet (75 mg total) by mouth 2 (two) times daily. 07/01/17   Tonia Ghent, MD  cephALEXin (KEFLEX) 250 MG capsule Take 1 capsule (250 mg total) by mouth daily. 07/21/17   Tonia Ghent, MD  doxycycline (VIBRA-TABS) 100 MG tablet Take 1 tablet (100 mg total) by mouth 2 (two) times daily. 07/26/17   Tonia Ghent, MD  ELIQUIS 2.5 MG TABS tablet TAKE 1 TABLET BY MOUTH TWICE DAILY 07/18/17   Bensimhon, Shaune Pascal, MD  fluticasone [facility]) 50 MCG/ACT nasal spray Place 1 spray into both nostrils daily as needed for allergies or rhinitis.    [provider]  glimepiride (AMARYL) 1 MG tablet TAKE '1MG'$  BY MOUTH EVERY MORNING WITH BREAKFAST 05/04/17   Tonia Ghent, MD  glucose blood (ONE TOUCH ULTRA TEST) test strip CHECK BLOOD SUGAR TWICE A DAY AND AS DIRECTED;DX E11.21 05/24/17   Tonia Ghent, MD  Insulin Glargine (LANTUS SOLOSTAR) 100 UNIT/ML Solostar Pen Inject 20 Units into the skin at bedtime. 07/26/17   Tonia Ghent, MD  Insulin Pen Needle (PEN NEEDLES) 30G X 5 MM MISC Inject 1 Dose as directed daily. Use with insulin pen 01/27/17   Tonia Ghent, MD  loratadine (CLARITIN CHILDRENS) 5 MG chewable tablet Chew 1 tablet (5 mg total) by mouth every evening. 07/26/17   Tonia Ghent, MD  metoprolol succinate (TOPROL-XL) 25 MG 24 hr tablet Take 1 tablet (25 mg total) by mouth 2 (two)  times daily. 04/05/17   Velvet Bathe, MD  Multiple Vitamins-Minerals (ONE-A-DAY 50 PLUS PO) Take 1 capsule by mouth daily.    [provider]  pantoprazole (PROTONIX) 40 MG tablet TAKE '40MG'$  BY MOUTH DAILY 05/04/17   Tonia Ghent, MD  senna-docusate (SENOKOT-S) 8.6-50 MG tablet Take 0.5 tablets by mouth daily. 07/26/17   Tonia Ghent, MD  simethicone (GAS RELIEF) 80 MG  chewable tablet CHEW 80 MG BY MOUTH EVERY 6 HOURS AS NEEDED FOR GAS 07/26/17   Tonia Ghent, MD  torsemide (DEMADEX) 20 MG tablet Take 4 tablets (80 mg total) by mouth daily. 07/12/17   Larey Dresser, MD  traZODone (DESYREL) 50 MG tablet Take 0.5-1 tablets (25-50 mg total) by mouth at bedtime as needed for sleep. Don't take >'50mg'$  in a day. 01/21/16   Tonia Ghent, MD    Physical Exam: Vitals:   08/02/2017 2300 08/08/17 0000 08/08/17 0015 08/08/17 0100  BP: 121/61 109/62 104/60 116/72  Pulse: 74 70 70 70  Resp: '17 17 14 '$ (!) 22  Temp:      TempSrc:      SpO2: 97% 97% 98% 98%  Weight:      Height:          Constitutional: NAD, calm, in apparent discomfort, obese Eyes: PERTLA, lids and conjunctivae normal ENMT: Mucous membranes are dry. Posterior pharynx clear of any exudate or lesions.   Neck: normal, supple, no masses, no thyromegaly Respiratory: Coarse rales at left base. No tachypnea. No accessory muscle use.  Cardiovascular: S1 & S2 heard, regular rate and rhythm. No significant JVD. Abdomen: No distension, no tenderness, no masses palpated. Bowel sounds normal.  Musculoskeletal: no clubbing / cyanosis. Left forearm/wrist immobilized in sugartong splint; good cap-refill, sensation, and motor function in left fingers.   Skin: no significant rashes, lesions, ulcers. Warm, dry, well-perfused. Neurologic: CN 2-12 grossly intact. Sensation intact.  Psychiatric: Alert and oriented x 3. Pleasant, cooperative.     Labs on Admission: I have personally reviewed following labs and imaging  studies  CBC:  Recent Labs Lab 07/16/2017 2231  WBC 12.4*  NEUTROABS 8.5*  HGB 9.1*  HCT 28.6*  MCV 95.7  PLT 992*   Basic Metabolic Panel:  Recent Labs Lab 08/08/17 0043  NA 138  K 3.9  CL 99*  CO2 27  GLUCOSE 243*  BUN 60*  CREATININE 3.76*  CALCIUM 8.4*   GFR: Estimated Creatinine Clearance: 11.2 mL/min (A) (by C-G formula based on SCr of 3.76 mg/dL (H)). Liver Function Tests:  Recent Labs Lab 08/08/17 0043  AST 88*  ALT 34  ALKPHOS 191*  BILITOT 3.0*  PROT 6.5  ALBUMIN 2.2*   No results for input(s): LIPASE, AMYLASE in the last 168 hours. No results for input(s): AMMONIA in the last 168 hours. Coagulation Profile: No results for input(s): INR, PROTIME in the last 168 hours. Cardiac Enzymes: No results for input(s): CKTOTAL, CKMB, CKMBINDEX, TROPONINI in the last 168 hours. BNP (last 3 results)  Recent Labs  09/20/16 1458 03/16/17 1441 04/11/17 1345  PROBNP 140.0* 287.0* 215.0*   HbA1C: No results for input(s): HGBA1C in the last 72 hours. CBG: No results for input(s): GLUCAP in the last 168 hours. Lipid Profile: No results for input(s): CHOL, HDL, LDLCALC, TRIG, CHOLHDL, LDLDIRECT in the last 72 hours. Thyroid Function Tests: No results for input(s): TSH, T4TOTAL, FREET4, T3FREE, THYROIDAB in the last 72 hours. Anemia Panel: No results for input(s): VITAMINB12, FOLATE, FERRITIN, TIBC, IRON, RETICCTPCT in the last 72 hours. Urine analysis:    Component Value Date/Time   COLORURINE YELLOW 07/13/2017 2313   APPEARANCEUR CLEAR 08/01/2017 2313   LABSPEC 1.013 07/21/2017 2313   PHURINE 5.0 07/29/2017 2313   GLUCOSEU NEGATIVE 07/25/2017 2313   HGBUR NEGATIVE 08/09/2017 2313   BILIRUBINUR NEGATIVE 07/15/2017 2313   BILIRUBINUR 1+ 09/20/2016 1424   KETONESUR NEGATIVE 07/30/2017 2313   PROTEINUR NEGATIVE  08/02/2017 2313   UROBILINOGEN 0.2 09/20/2016 1424   UROBILINOGEN 0.2 08/02/2015 0330   NITRITE NEGATIVE 07/27/2017 2313   LEUKOCYTESUR  TRACE (A) 07/14/2017 2313   Sepsis Labs: '@LABRCNTIP'$ (procalcitonin:4,lacticidven:4) )No results found for this or any previous visit (from the past 240 hour(s)).   Radiological Exams on Admission: Dg Wrist Complete Left  Result Date: 07/11/2017 CLINICAL DATA:  83 year old female with fall and left wrist injury. EXAM: LEFT WRIST - COMPLETE 3+ VIEW COMPARISON:  None. FINDINGS: There is a displaced transverse fracture of the distal ulna. There is a comminuted appearing transverse fracture of the distal radial metaphysis with mild dorsal angulation and approximately 1/2 shaft width dorsal displacement. No definite intra-articular extension identified. There is no dislocation. The bones are osteopenic. There are degenerative changes of the carpal joints and osteoarthritic changes of the base of the thumb. There is diffuse soft tissue swelling of the wrist. IMPRESSION: Dorsally displaced and angulated fracture of the distal radial metaphysis as well as mildly displaced fracture of the distal ulna. Electronically Signed   By: Anner Crete M.D.   On: 07/20/2017 21:58   Ct Head Wo Contrast  Result Date: 08/08/2017 CLINICAL DATA:  Posttraumatic headache. Fall striking right side of face. On anticoagulation. EXAM: CT HEAD WITHOUT CONTRAST TECHNIQUE: Contiguous axial images were obtained from the base of the skull through the vertex without intravenous contrast. COMPARISON:  Head CT and brain MRI 06/03/2017 FINDINGS: Brain: Stable degree of atrophy and chronic small vessel ischemia. Remote lacunar infarcts in the right basal ganglia, many of which are better appreciated on prior MRI. No intracranial hemorrhage, mass effect, or midline shift. No hydrocephalus. The basilar cisterns are patent. No evidence of territorial infarct or acute ischemia. No extra-axial or intracranial fluid collection. Vascular: Atherosclerosis of skullbase vasculature without hyperdense vessel or abnormal calcification. Skull: No  fracture or focal lesion. Sinuses/Orbits: Paranasal sinuses and mastoid air cells are clear. The visualized orbits are unremarkable. Bilateral cataract resection. Other: None. IMPRESSION: 1.  No acute intracranial abnormality.  No skull fracture. 2. Stable atrophy and chronic small vessel ischemia. Electronically Signed   By: Jeb Levering M.D.   On: 08/08/2017 01:58   Dg Chest Port 1 View  Result Date: 07/15/2017 CLINICAL DATA:  Chest contusion EXAM: PORTABLE CHEST 1 VIEW COMPARISON:  07/08/2017, 5 7,018 FINDINGS: Very low lung volumes. Patchy atelectasis or infiltrate at the left base. No large effusion. Stable enlarged cardiomediastinal silhouette. No pneumothorax. Right-sided pacing device as before. IMPRESSION: Low lung volumes with patchy atelectasis or infiltrate at the left lung base. Cardiomegaly. Electronically Signed   By: Donavan Foil M.D.   On: 07/26/2017 23:17   Dg Shoulder Left Portable  Result Date: 08/08/2017 CLINICAL DATA:  Shoulder pain, fall EXAM: LEFT SHOULDER - 1 VIEW COMPARISON:  06/24/2016 FINDINGS: No dislocation. Mild AC joint degenerative change. No definitive fracture. Marked arthritis at the glenohumeral interval. Slightly high riding appearance of the humeral head. IMPRESSION: 1. No definite acute osseous abnormality 2. Advanced arthritis at the left shoulder. Narrowed subacromial space consistent with rotator cuff disease Electronically Signed   By: Donavan Foil M.D.   On: 08/08/2017 00:52    EKG: Independently reviewed. Atrial-paced rhythm with chronic RBBB and LAFB.   Assessment/Plan  1. Left distal radius and ulna fractures  - Pt presents with severe left wrist pain after a ground-level mechanical fall  - Radiographs confirm fractures to distal ulna and radius with displacement  - Hand surgery is consulting and much appreciated, will follow-up  on recommendation  - Plan to keep arm elevated, continue prn analgesia, hold Eliquis pending surgical eval (last  dose was am of 10/28)  2. HCAP  - Pt has been increasingly weak generally and lethargic, reports some mild cough and chest congestion  - CXR suggests pneumonia in left base  - Blood cultures collected in ED, will add sputum culture and check for strep pneumo antigen  - Continue cefepime and vancomycin, trend procalcitonin, follow cultures and clinical course    3. Acute kidney injury superimposed on CKD stage IV  - SCr is 3.76 on admission, up from 2.36 two weeks earlier  - She was treated in ED with 1.5 liters NS  - Check urine studies, hold torsemide, repeat chem panel in am   4. Chronic diastolic CHF  - Pt appears roughly euvolemic on admission  - She was given 1.5 liters NS in ED in setting of AKI on CKD and acute illness  - Plan to SLIV, hold torsemide initially, follow daily wts and I/O's, continue beta-blocker   5. Occult GI bleeding  - Pt has chronic GI blood-loss and takes daily PPI which will be continued  - Stool was positive for occult blood in ED, Hgb is at lower end of her usual range  - Eliquis is held pending surgical eval as above, type and screen will be performed and CBC repeated tomorrow   6. Anemia, thrombocytopenia  - Hgb is 9.1 on admission and platelets 135k  - Platelet count stable and Hgb at lower-end of her usual range  - Type & screen and repeat CBC in am as above   7. Hyperbilirubinemia  - Total bilirubin in 3.0 on admission and AST is 80; both indices creeping up last few months   - No abd tenderness   - Will fractionate bilirubin, repeat LFT's in am    8. Atrial fibrillation  - CHADS-VASc is the full 18 (age x2, gender, CVA x2, DM, CHF, HTN, CAD) - Continue amiodarone and metoprolol  - Eliquis held pending eval by hand surgeon    9. Hx of CVA  - No acute deficits, head CT negative  - Continue statin and resume Eliquis if fractures will be managed non-surgically  10. Insulin-dependent DM  - A1c was 8.3% in July  - Managed at home with Lantus 20  units qHS  - Continue Lantus 20 units qHS and add a low-intensity SSI    DVT prophylaxis: Eliquis, held on admission pending surgical eval; SCD's for now Code Status: Full  Family Communication: Daughter updated at bedside  Disposition Plan: Admit to telemetry Consults called: Hand surgery Admission status: Inpatient    Vianne Bulls, MD [facility] Pager [phone]  If 7PM-7AM, please contact night-coverage [email or web address] Password [email or web address]  08/08/2017, 2:54 AM

## 2017-08-08 NOTE — Progress Notes (Signed)
CRITICAL VALUE ALERT  Critical Value:  Lactic Acid 2.7  Date & Time Notied:  08/08/17 @ 0806  Provider Notified: Dr. Cena Benton  Orders Received/Actions taken: Text paged, no new orders at this time.

## 2017-08-08 NOTE — ED Provider Notes (Signed)
Care assumed at shift change from Dr. Margarita Grizzle, pending CT head results, shoulder xray, EKG, and CMP. Plan for admission for HCAP, left wrist fracture, and anemia with stable hgb and positive hemoccult.   Vitals:   08/08/17 0015 08/08/17 0100  BP: 104/60 116/72  Pulse: 70 70  Resp: 14 (!) 22  Temp:    SpO2: 98% 98%    Pt multiple comorbidities, on anticoagulation, presenting with multiple recent falls, 2 today, with left wrist injury. Pt with multiple bruises in different stages of healing from multiple falls. Family reports she has been having problems with balance lately, and is overall in poor health lately.  X-ray of left wrist showing closed dorsally displaced and angulated fracture of the distal radius is mildly displaced fracture of the distal ulna.  On exam, hand appears mildly pale compared to right, however with normal capillary refill and sensation.  Placed in splint and hand surgery was consulted by Dr. Rosalia Hammers, Dr. Jena Gauss to see patient in the morning.  Xray of shoulder negative for acute pathology.  CT head negative for acute pathology.  White Count 12.4.  CMP showing acute on chronic renal failure; elev bili likely 2/t GI bleed. UA without evidence of UTI.  Lactic 2.53. patient given fluids, vancomycin and cefipime. Blood cultures sent. Dr. Antionette Char with [facility] accepting admission for HCAP pneumonia, GI bleed with stable hemoglobin, wrist fracture, and acute on chronic renal failure.    [place], [place] N, PA-C 08/08/17 0272    Margarita Grizzle, MD 08/09/17 [phone]

## 2017-08-08 NOTE — Telephone Encounter (Signed)
Daughter advised.

## 2017-08-08 NOTE — Consult Note (Signed)
Orthopaedic Trauma Service (OTS) Consult   Patient ID: Danielle Benitez MRN: [id number] DOB/AGE: [birth year] 83 y.o.  Reason for Consult:Left distal radius and ulna fracture Referring Physician: Dr. Pattricia Boss, MD [facility]  HPI: Danielle Benitez is an 83 y.o. female who is being seen in consultation at the request of Dr. Jeanell Sparrow for evaluation of left distal radius and ulna fracture.  Danielle Benitez has a history of multiple falls.  She lost her balance earlier today and fell on her left wrist.  She had immediate pain and deformity.  She was brought to the emergency room which showed a distal radius fracture and distal ulna fracture.  She was splinted by Dr. Jeanell Sparrow.  According to daughter the patient has been not eating or drinking very well and she has had multiple frequent urinary tract infections.  And she also has been in poor health over the last few months.  She was found to have a pneumonia and was brought into the hospital for treatment of that.  I was called yesterday evening and a splint was placed on her left wrist.  Past Medical History:  Diagnosis Date  . Abnormality of gait   . Anemia, unspecified   . Anticoagulated- Eliquis 07/31/2015  . Anxiety state, unspecified   . Aortic stenosis    a. Mild by echo 08/2013, not seen on repeat echo 02/2017.  Marland Kitchen Bifascicular block    afib  . Carotid artery disease (HCC)    a. Carotid dopplers (0/93) with 26-71% LICA stenosis (of note, prior ABI normal).  . Chronic diastolic CHF (congestive heart failure) ([place])    a. Dx 07/2015 - EF 35-40%, unclear etiology ? r/t afib or viral etiology. b. EF normal in 02/2017.  . CKD (chronic kidney disease), stage III ([place])   . Depressive disorder, not elsewhere classified   . DM (diabetes mellitus), type 2, uncontrolled, with renal complications ([place]) 2/45/8099  . Eczema 11/22/2013  . Esophageal dysmotility 08/05/2015  . Essential hypertension   . Female stress incontinence 05/06/2015  . Full dentures   . GERD  (gastroesophageal reflux disease)   . Gout   . HOH (hard of hearing)   . Internal hemorrhoids without mention of complication   . Iron deficiency   . Lumbago   . Memory loss   . Mild CAD    a. Cath 2013: 20-30% prox RCA.  Marland Kitchen Myocardial infarction ([place]) 07/2015  . Nonspecific abnormal results of liver function study   . Obesity, unspecified   . Orthostatic hypotension   . Osteoarthritis   . Other and unspecified hyperlipidemia   . PAF (paroxysmal atrial fibrillation) ([place]) 07/11/2015   a. prior DCCVs, amiodarone, anticoagulation.  . Pain in joint, shoulder region 11/22/2013   left   . Personal history of fall   . Presence of permanent cardiac pacemaker   . Pruritus 11/22/2013  . S/P cardiac pacemaker procedure 01/11/2012   a. for symptomatic bradycardia. Followed by Dr. Lovena Le. Device changed out 2017 due to ppm infection.  . Stroke [facility])    a. evidence of prior strokes on MRI in 2018.  Marland Kitchen UTI (urinary tract infection) 04/2017  . Wears glasses     Past Surgical History:  Procedure Laterality Date  . CARDIAC CATHETERIZATION  12/03/2003   Dr Rex Kras  . CARDIOVERSION N/A 07/25/2015   Procedure: CARDIOVERSION;  Surgeon: Fay Records, MD;  Location: [facility] ENDOSCOPY;  Service: Cardiovascular;  Laterality: N/A;  . CARDIOVERSION N/A 08/06/2015   Procedure: CARDIOVERSION;  Surgeon: Larey Dresser, MD;  Location: [place];  Service: Cardiovascular;  Laterality: N/A;  . CATARACT EXTRACTION W/ INTRAOCULAR LENS  IMPLANT, BILATERAL Bilateral 03/2015-04/2015  . COLONOSCOPY     ?????  . EP IMPLANTABLE DEVICE N/A 04/15/2016   Procedure: Pacemaker Implant;  Surgeon: Evans Lance, MD;  Location: [place] CV LAB;  Service: Cardiovascular;  Laterality: N/A;  . ESOPHAGOGASTRODUODENOSCOPY N/A 08/08/2015   Procedure: ESOPHAGOGASTRODUODENOSCOPY (EGD);  Surgeon: Gatha Mayer, MD;  Location: [facility] ENDOSCOPY;  Service: Endoscopy;  Laterality: N/A;  . EXTREMITY CYST EXCISION     finger  . EYE SURGERY  Bilateral    catartact  . INSERT / REPLACE / REMOVE PACEMAKER    . LEFT HEART CATHETERIZATION WITH CORONARY ANGIOGRAM N/A 01/10/2012   Procedure: LEFT HEART CATHETERIZATION WITH CORONARY ANGIOGRAM;  Surgeon: Pixie Casino, MD;  Location: [facility] CATH LAB;  Service: Cardiovascular;  Laterality: N/A;  . ORIF WRIST FRACTURE Right 04/09/2014   Procedure: OPEN REDUCTION INTERNAL FIXATION (ORIF) RIGHT DISPLACED  DISTAL RADIUS  FRACTURE;  Surgeon: Roseanne Kaufman, MD;  Location: [place];  Service: Orthopedics;  Laterality: Right;  . PACEMAKER LEAD REMOVAL  04/12/2016   TEMPORARY PACEMAKER INSERTION  . PACEMAKER LEAD REMOVAL  04/15/2016   Procedure: Pacemaker Lead Removal;  Surgeon: Evans Lance, MD;  Location: [place] CV LAB;  Service: Cardiovascular;;  . PACEMAKER LEAD REMOVAL N/A 04/12/2016   Procedure: PACEMAKER LEAD REMOVAL;  Surgeon: Evans Lance, MD;  Location: [place];  Service: Cardiovascular;  Laterality: N/A;  . PERMANENT PACEMAKER INSERTION Left 01/11/2012   Procedure: PERMANENT PACEMAKER INSERTION;  Surgeon: Sanda Klein, MD;  Location: [place] CATH LAB;  Service: Cardiovascular;  Laterality: Left;  . TEE WITHOUT CARDIOVERSION N/A 07/25/2015   Procedure: TRANSESOPHAGEAL ECHOCARDIOGRAM (TEE);  Surgeon: Fay Records, MD;  Location: [facility] ENDOSCOPY;  Service: Cardiovascular;  Laterality: N/A;    Family History  Problem Relation Age of Onset  . Breast cancer Mother   . Cancer Mother   . Diabetes Daughter   . Hypertension Daughter   . Cancer Brother     Social History:  reports that she has never smoked. She has never used smokeless tobacco. She reports that she does not drink alcohol or use drugs.  Allergies:  Allergies  Allergen Reactions  . Beta Adrenergic Blockers Other (See Comments)    Couldn't speak or hear Metoprolol seems ok  . Jardiance [Empagliflozin] Other (See Comments)    Rash, kidney issues  . Lexapro [Escitalopram Oxalate] Other (See Comments)    Lack of  effect.   Marland Kitchen Spironolactone Other (See Comments)    Hyperkalemia  . Zoloft [Sertraline Hcl] Other (See Comments)    Lack of effect.     Medications:  No current facility-administered medications on file prior to encounter.    Current Outpatient Prescriptions on File Prior to Encounter  Medication Sig Dispense Refill  . albuterol (PROVENTIL HFA;VENTOLIN HFA) 108 (90 Base) MCG/ACT inhaler Inhale 2 puffs into the lungs every 4 (four) hours as needed for wheezing or shortness of breath (cough, shortness of breath or wheezing.). 1 Inhaler 1  . allopurinol (ZYLOPRIM) 100 MG tablet Take 2 tablets (200 mg total) by mouth daily. 60 tablet 0  . amiodarone (PACERONE) 200 MG tablet TAKE 200MG BY MOUTH DAILY    . atorvastatin (LIPITOR) 20 MG tablet Take 1 tablet (20 mg total) by mouth daily at 6 PM. 30 tablet 0  . Blood Glucose Monitoring Suppl (ONE TOUCH ULTRA  2) w/Device KIT Check blood sugar twice a day and as directed Dx E11.21 1 each 0  . buPROPion (WELLBUTRIN) 75 MG tablet Take 1 tablet (75 mg total) by mouth 2 (two) times daily. 60 tablet 1  . doxycycline (VIBRA-TABS) 100 MG tablet Take 1 tablet (100 mg total) by mouth 2 (two) times daily. (Patient taking differently: Take 100 mg by mouth every morning. ) 20 tablet 0  . ELIQUIS 2.5 MG TABS tablet TAKE 1 TABLET BY MOUTH TWICE DAILY 180 tablet 3  . fluticasone (FLONASE) 50 MCG/ACT nasal spray Place 1 spray into both nostrils daily as needed for allergies or rhinitis.    Marland Kitchen glimepiride (AMARYL) 1 MG tablet TAKE 1MG BY MOUTH EVERY MORNING WITH BREAKFAST 90 tablet 1  . glucose blood (ONE TOUCH ULTRA TEST) test strip CHECK BLOOD SUGAR TWICE A DAY AND AS DIRECTED;DX E11.21 100 each 5  . Insulin Glargine (LANTUS SOLOSTAR) 100 UNIT/ML Solostar Pen Inject 20 Units into the skin at bedtime. (Patient taking differently: Inject 18 Units into the skin at bedtime. )    . Insulin Pen Needle (PEN NEEDLES) 30G X 5 MM MISC Inject 1 Dose as directed daily. Use with  insulin pen 100 each 3  . loratadine (CLARITIN CHILDRENS) 5 MG chewable tablet Chew 1 tablet (5 mg total) by mouth every evening.    . metoprolol succinate (TOPROL-XL) 25 MG 24 hr tablet Take 1 tablet (25 mg total) by mouth 2 (two) times daily. 60 tablet 0  . Multiple Vitamins-Minerals (ONE-A-DAY 50 PLUS PO) Take 1 capsule by mouth daily.    . pantoprazole (PROTONIX) 40 MG tablet TAKE 40MG BY MOUTH DAILY    . senna-docusate (SENOKOT-S) 8.6-50 MG tablet Take 0.5 tablets by mouth daily.    . simethicone (GAS RELIEF) 80 MG chewable tablet CHEW 80 MG BY MOUTH EVERY 6 HOURS AS NEEDED FOR GAS 30 tablet 5  . torsemide (DEMADEX) 20 MG tablet Take 4 tablets (80 mg total) by mouth daily.    . traZODone (DESYREL) 50 MG tablet Take 0.5-1 tablets (25-50 mg total) by mouth at bedtime as needed for sleep. Don't take >53m in a day. 30 tablet 3    ROS: Constitutional: +general malasie Vision: No changes in vision ENT: No difficulty swallowing CV: No chest pain Pulm: +SOB GI: No nausea or vomiting GU: Some increased frequenct Skin: No poor wound healing Neurologic: No numbness or tingling Psychiatric: No depression or anxiety Heme: No bruising Allergic: No reaction to medications or food   Exam: Blood pressure 102/60, pulse 74, temperature 98 F (36.7 C), temperature source Oral, resp. rate 18, height _0  (1.575 m), weight 80.8 kg (178 lb 3.2 oz), SpO2 100 %. General:Sleeping  Orientation: Not fully oriented to place or time Mood and Affect: Cooperative and pleasant Gait: Not assessed Coordination and balance: Difficult to assess due to current situation.  Left upper extremity: Left upper extremity: Reveals a splint that is clean dry and intact.  She is warm well-perfused digits.  She has intact motor and sensory function to median, radial and ulnar nerve distribution. No instability noted about elbow or shoulder. Soft and compressible compartments  Right upper extremity: Skin without lesions.  No tenderness to palpation. Full painless ROM, full strength in each muscle groups without evidence of instability.   Medical Decision Making: Imaging: X-rays from yesterday evening of the left wrist show a displaced left distal radius fracture and distal ulnar fracture.  There is some mild volar translation.  There  is not a significant amount of angulation.  Labs: Lactic acid of 2.7 hemoglobin of 7.6, white blood cell count of 9.2, glucose of 284  Medical history and chart was reviewed  Assessment/Plan: 83 year old female with significant past medical history for aortic stenosis, coronary artery disease, congestive heart failure, chronic kidney disease, diabetes type 2-insulin-dependent with left distal radius and ulna fracture.  I reviewed the imaging I feel that this would likely be able to be treated nonoperatively.  I will plan to get x-rays now that she has been splinted overnight.  Depending on the x-ray findings I will discuss further with her and her daughter regarding treatment options.  She is a relatively poor health and has acute exacerbation and pneumonia so likely a surgery during this hospitalization would not be appropriate.  Update: Post splint x-rays are reviewed which show good alignment and will continue to plan to treat nonoperatively   Shona Needles, MD [facility] [phone] (phone)

## 2017-08-08 NOTE — Progress Notes (Signed)
Patient seen earlier this am by my associate. Please refer to H and P for details history assessment and plan.  Patient was not in the room when I was rounding. Chart reviewed and stable.  Will continue plan as outlined by admitting physician.  [facility]  Will reassess next am after procedure complete  VSS  Jaislyn Blinn, Pamala Hurry

## 2017-08-08 NOTE — Telephone Encounter (Signed)
Hold the rx for now- she'll likely need med dose adjustment based on her creatinine after discharge- currently inpatient.  Thanks.

## 2017-08-08 NOTE — Progress Notes (Signed)
Hypoglycemic Event  CBG: 62 @ 1615  Treatment: D50 IV 25 mL  Symptoms: None  Follow-up CBG: Time: 1656 CBG Result: 103  Possible Reasons for Event: Inadequate meal intake - pt is NPO for possible surgery.      [name]

## 2017-08-09 LAB — GLUCOSE, CAPILLARY
GLUCOSE-CAPILLARY: 104 mg/dL — AB (ref 65–99)
GLUCOSE-CAPILLARY: 84 mg/dL (ref 65–99)
GLUCOSE-CAPILLARY: 93 mg/dL (ref 65–99)
GLUCOSE-CAPILLARY: 99 mg/dL (ref 65–99)
Glucose-Capillary: 92 mg/dL (ref 65–99)
Glucose-Capillary: 118 mg/dL — ABNORMAL HIGH (ref 65–99)

## 2017-08-09 LAB — PROCALCITONIN: PROCALCITONIN: 0.26 ng/mL

## 2017-08-09 LAB — UREA NITROGEN, URINE: UREA NITROGEN UR: 313 mg/dL

## 2017-08-09 LAB — LACTIC ACID, PLASMA: LACTIC ACID, VENOUS: 2.1 mmol/L — AB (ref 0.5–1.9)

## 2017-08-09 MED ORDER — ALLOPURINOL 100 MG PO TABS
200.0000 mg | ORAL_TABLET | Freq: Every day | ORAL | Status: DC
  Administered 2017-08-09 – 2017-08-13 (×5): 200 mg via ORAL
  Filled 2017-08-09 (×6): qty 2

## 2017-08-09 MED ORDER — APIXABAN 2.5 MG PO TABS
2.5000 mg | ORAL_TABLET | Freq: Two times a day (BID) | ORAL | Status: DC
  Administered 2017-08-09 – 2017-08-13 (×9): 2.5 mg via ORAL
  Filled 2017-08-09 (×11): qty 1

## 2017-08-09 NOTE — Care Management Note (Addendum)
Case Management Note  Patient Details  Name: JAYDENCE MONTANO MRN: [id number] Date of Birth: 28-Mar-1932  Subjective/Objective:                 Patient from home, fell using RW and broke wrist. Splinted, plan is for non surgical treatment. Requested PT OT eval to assist w gait training and needs as patient will not be able to use RW w broken wrist. Spoke w patient and daughter at the bedside. They wish to return home with [facility] continueing through [facility]. Referral placed to Becky Sax, she will schedule SOC tentative for late tomorrow. Spoke w Nida Boatman w [facility] for specifications for ordering WC with movable arms as requested by daugter. Order placed. No other CM needs identified at this time.     Action/Plan:   Expected Discharge Date:                  Expected Discharge Plan:     In-House Referral:     Discharge planning Services  CM Consult  Post Acute Care Choice:    Choice offered to:     DME Arranged:    DME Agency:     HH Arranged:    HH Agency:     Status of Service:  In process, will continue to follow  If discussed at Long Length of Stay Meetings, dates discussed:    Additional Comments:  Lawerance Sabal, RN 08/09/2017, 12:14 PM

## 2017-08-09 NOTE — Care Management (Signed)
    Durable Medical Equipment        Start     Ordered   08/09/17 1242  For home use only DME lightweight manual wheelchair with seat cushion  Once    Comments:  Patient suffers from broken wrist which impairs their ability to perform daily activities like walking with rolling walker in the home.  A walker will not resolve  issue with performing activities of daily living. A wheelchair will allow patient to safely perform daily activities. Patient is not able to propel themselves in the home using a standard weight wheelchair due to weakness. Patient can self propel in the lightweight wheelchair.  Accessories: elevating leg rests (ELRs), wheel locks, extensions, and anti-tippers and gel seat cushion, removable arms.   08/09/17 1244

## 2017-08-09 NOTE — Progress Notes (Signed)
CRITICAL VALUE ALERT  Critical Value:  Lactic acid 2.1  Date & Time Notied:  08/09/17  2000  Provider Notified: Janyth Contes  Orders Received/Actions taken:none

## 2017-08-09 NOTE — Progress Notes (Signed)
PROGRESS NOTE    Danielle Benitez  ZOX:[id number] DOB: 09-14-1932 DOA: 02-13-2017 PCP: Joaquim Namuncan, [name], MD   Brief Narrative:  83 y.o. female with medical history significant for insulin-dependent diabetes mellitus, hypertension, history of CVA, atrial fibrillation on Eliquis, chronic diastolic CHF, and chronic kidney disease stage IV, now presenting to the emergency department with severe left wrist pain after a mechanical fall.  Orthopedic surgery consulted. Plan is to treat nonoperatively   Assessment & Plan:   Principal Problem:   Left wrist fracture, closed, initial encounter - Plans to treat nonoperatively - Eliquis is being held until decision was made.    HCAP (healthcare-associated pneumonia) - Daughter states the patient has been more fatigued in the last few weeks and has had increasing shortness of breath and cough. - Pt is on Cefepime and vancomycin  Active Problems:   DM, type 2, with renal complications  - Lantus - SSI    Mild CAD - 20-30% RCA in 2013 - Pt on statin    Depression - Wellbutrin    CKD (chronic kidney disease), stage IV (HCC) - stable currently    Essential hypertension - Pt on metoprolol    Normocytic anemia    Atrial fibrillation - 07/11/15 by PPM interrogation, recurrent 10/19 after DCCV 10/14 - We'll continue eliquis - Pt on amiodarone, toprol    Thrombocytopenia (HCC)   Chronic diastolic CHF (congestive heart failure) (HCC) - Compensated currently. Patient is on Toprol    History of CVA (cerebrovascular accident) - stable, continue eliquis - no new deficits reported.   DVT prophylaxis: Eliquis Code Status: Full Family Communication: Discussed with patient and family at bedside. Disposition Plan: pending improvement in condition   Consultants:   Orthopaedic surgeon: Dr. Jena GaussHaddix   Procedures: None   Antimicrobials:  Cefepime  Vancomycin   Subjective: Pt has no new complaints.   Objective: Vitals:   08/08/17  2133 08/08/17 2140 08/09/17 0516 08/09/17 1252  BP: (!) 99/44 (!) 132/39 (!) 111/39 (!) 122/53  Pulse: 70 71 70 68  Resp: 16  16 18   Temp: (!) 97.4 F (36.3 C)  (!) 97.5 F (36.4 C) (!) 97.1 F (36.2 C)  TempSrc: Oral  Oral Oral  SpO2: 96%  95% 96%  Weight:      Height:        Intake/Output Summary (Last 24 hours) at 08/09/17 1616 Last data filed at 08/09/17 0700  Gross per 24 hour  Intake              170 ml  Output              600 ml  Net             -430 ml   Filed Weights   28-Sep-2017 2110  Weight: 80.8 kg (178 lb 3.2 oz)    Examination:   General exam: Appears calm and comfortable, in nad.  Respiratory system: Clear to auscultation. Respiratory effort normal. No wheezes Cardiovascular system: S1 & S2 heard, RRR. No JVD, murmurs, rubs  Gastrointestinal system: Abdomen is nondistended, soft and nontender. No organomegaly or masses felt. Normal bowel sounds heard. Central nervous system: awake and alert, no facial asymmetry Extremities:  Reveals a splint at left upper extremities Skin: No rashes,or ulcers, on limited exam. On limited exam. Psychiatry: Mood & affect appropriate.   Data Reviewed: I have personally reviewed following labs and imaging studies  CBC:  Recent Labs Lab 28-Sep-2017 2231 08/08/17 0309  WBC 12.4* 9.2  NEUTROABS 8.5* 6.2  HGB 9.1* 7.6*  HCT 28.6* 24.1*  MCV 95.7 94.1  PLT 135* 67*   Basic Metabolic Panel:  Recent Labs Lab 08/08/17 0043 08/08/17 0309  NA 138 136  K 3.9 4.1  CL 99* 101  CO2 27 26  GLUCOSE 243* 284*  BUN 60* 60*  CREATININE 3.76* 3.51*  CALCIUM 8.4* 7.8*   GFR: Estimated Creatinine Clearance: 12 mL/min (A) (by C-G formula based on SCr of 3.51 mg/dL (H)). Liver Function Tests:  Recent Labs Lab 08/08/17 0043 08/08/17 0309  AST 88* 81*  ALT 34 29  ALKPHOS 191* 158*  BILITOT 3.0* 2.5*  PROT 6.5 5.3*  ALBUMIN 2.2* 1.9*   No results for input(s): LIPASE, AMYLASE in the last 168 hours. No results for  input(s): AMMONIA in the last 168 hours. Coagulation Profile: No results for input(s): INR, PROTIME in the last 168 hours. Cardiac Enzymes: No results for input(s): CKTOTAL, CKMB, CKMBINDEX, TROPONINI in the last 168 hours. BNP (last 3 results)  Recent Labs  09/20/16 1458 03/16/17 1441 04/11/17 1345  PROBNP 140.0* 287.0* 215.0*   HbA1C: No results for input(s): HGBA1C in the last 72 hours. CBG:  Recent Labs Lab 08/09/17 0004 08/09/17 0425 08/09/17 0509 08/09/17 0824 08/09/17 1248  GLUCAP 99 92 104* 93 84   Lipid Profile: No results for input(s): CHOL, HDL, LDLCALC, TRIG, CHOLHDL, LDLDIRECT in the last 72 hours. Thyroid Function Tests: No results for input(s): TSH, T4TOTAL, FREET4, T3FREE, THYROIDAB in the last 72 hours. Anemia Panel: No results for input(s): VITAMINB12, FOLATE, FERRITIN, TIBC, IRON, RETICCTPCT in the last 72 hours. Sepsis Labs:  Recent Labs Lab 08/08/17 0058 08/08/17 0309 08/08/17 0721 08/09/17 0552  PROCALCITON  --  0.22  --  0.26  LATICACIDVEN 2.53* 2.0* 2.7*  --     Recent Results (from the past 240 hour(s))  Blood culture (routine x 2)     Status: None (Preliminary result)   Collection Time: 08/08/17 12:43 AM  Result Value Ref Range Status   Specimen Description BLOOD RIGHT ARM  Final   Special Requests   Final    BOTTLES DRAWN AEROBIC AND ANAEROBIC Blood Culture adequate volume   Culture NO GROWTH 1 DAY  Final   Report Status PENDING  Incomplete  Blood culture (routine x 2)     Status: None (Preliminary result)   Collection Time: 08/08/17  1:44 AM  Result Value Ref Range Status   Specimen Description BLOOD RIGHT HAND  Final   Special Requests   Final    BOTTLES DRAWN AEROBIC ONLY Blood Culture adequate volume   Culture NO GROWTH 1 DAY  Final   Report Status PENDING  Incomplete         Radiology Studies: Dg Wrist Complete Left  Result Date: 08/08/2017 CLINICAL DATA:  Left wrist fracture. EXAM: LEFT WRIST - COMPLETE 3+ VIEW  COMPARISON:  Radiographs of August 07, 2017. FINDINGS: The left wrist has been casted and immobilized. No significant changes seen involving moderately displaced distal left radial and ulnar fractures. Osteoarthritis of first carpometacarpal joint is again noted. IMPRESSION: Status post casting and immobilization of moderately displaced distal left radial and ulnar fractures. Electronically Signed   By: Lupita Raider, M.D.   On: 08/08/2017 11:28   Dg Wrist Complete Left  Result Date: 07/23/2017 CLINICAL DATA:  83 year old female with fall and left wrist injury. EXAM: LEFT WRIST - COMPLETE 3+ VIEW COMPARISON:  None. FINDINGS: There is a displaced transverse fracture of the  distal ulna. There is a comminuted appearing transverse fracture of the distal radial metaphysis with mild dorsal angulation and approximately 1/2 shaft width dorsal displacement. No definite intra-articular extension identified. There is no dislocation. The bones are osteopenic. There are degenerative changes of the carpal joints and osteoarthritic changes of the base of the thumb. There is diffuse soft tissue swelling of the wrist. IMPRESSION: Dorsally displaced and angulated fracture of the distal radial metaphysis as well as mildly displaced fracture of the distal ulna. Electronically Signed   By: Elgie Collard M.D.   On: 07/11/2017 21:58   Ct Head Wo Contrast  Result Date: 08/08/2017 CLINICAL DATA:  Posttraumatic headache. Fall striking right side of face. On anticoagulation. EXAM: CT HEAD WITHOUT CONTRAST TECHNIQUE: Contiguous axial images were obtained from the base of the skull through the vertex without intravenous contrast. COMPARISON:  Head CT and brain MRI 06/03/2017 FINDINGS: Brain: Stable degree of atrophy and chronic small vessel ischemia. Remote lacunar infarcts in the right basal ganglia, many of which are better appreciated on prior MRI. No intracranial hemorrhage, mass effect, or midline shift. No hydrocephalus.  The basilar cisterns are patent. No evidence of territorial infarct or acute ischemia. No extra-axial or intracranial fluid collection. Vascular: Atherosclerosis of skullbase vasculature without hyperdense vessel or abnormal calcification. Skull: No fracture or focal lesion. Sinuses/Orbits: Paranasal sinuses and mastoid air cells are clear. The visualized orbits are unremarkable. Bilateral cataract resection. Other: None. IMPRESSION: 1.  No acute intracranial abnormality.  No skull fracture. 2. Stable atrophy and chronic small vessel ischemia. Electronically Signed   By: Rubye Oaks M.D.   On: 08/08/2017 01:58   Dg Chest Port 1 View  Result Date: 07/22/2017 CLINICAL DATA:  Chest contusion EXAM: PORTABLE CHEST 1 VIEW COMPARISON:  07/08/2017, 5 7,018 FINDINGS: Very low lung volumes. Patchy atelectasis or infiltrate at the left base. No large effusion. Stable enlarged cardiomediastinal silhouette. No pneumothorax. Right-sided pacing device as before. IMPRESSION: Low lung volumes with patchy atelectasis or infiltrate at the left lung base. Cardiomegaly. Electronically Signed   By: Jasmine Pang M.D.   On: 07/29/2017 23:17   Dg Shoulder Left Portable  Result Date: 08/08/2017 CLINICAL DATA:  Shoulder pain, fall EXAM: LEFT SHOULDER - 1 VIEW COMPARISON:  06/24/2016 FINDINGS: No dislocation. Mild AC joint degenerative change. No definitive fracture. Marked arthritis at the glenohumeral interval. Slightly high riding appearance of the humeral head. IMPRESSION: 1. No definite acute osseous abnormality 2. Advanced arthritis at the left shoulder. Narrowed subacromial space consistent with rotator cuff disease Electronically Signed   By: Jasmine Pang M.D.   On: 08/08/2017 00:52        Scheduled Meds: . allopurinol  200 mg Oral Daily  . amiodarone  200 mg Oral Daily  . atorvastatin  20 mg Oral q1800  . buPROPion  75 mg Oral BID  . dextrose  25 mL Intravenous Once  . insulin aspart  0-9 Units  Subcutaneous Q4H  . insulin glargine  20 Units Subcutaneous QHS  . loratadine  5 mg Oral QPM  . metoprolol succinate  25 mg Oral BID  . pantoprazole  40 mg Oral Daily  . senna-docusate  0.5 tablet Oral Daily  . sodium chloride flush  3 mL Intravenous Q12H   Continuous Infusions: . sodium chloride    . ceFEPime (MAXIPIME) IV Stopped (08/08/17 2216)  . [START ON 08/10/2017] vancomycin       LOS: 1 day    Time spent: > 35 minutes  Haik Mahoney,  Pamala Hurry, MD [facility] Pager [phone]  If 7PM-7AM, please contact night-coverage [email or web address] Password [email or web address] 08/09/2017, 4:16 PM

## 2017-08-10 DIAGNOSIS — I2510 Atherosclerotic heart disease of native coronary artery without angina pectoris: Secondary | ICD-10-CM

## 2017-08-10 DIAGNOSIS — Z8673 Personal history of transient ischemic attack (TIA), and cerebral infarction without residual deficits: Secondary | ICD-10-CM

## 2017-08-10 LAB — BASIC METABOLIC PANEL
ANION GAP: 13 (ref 5–15)
Anion gap: 12 (ref 5–15)
BUN: 67 mg/dL — ABNORMAL HIGH (ref 6–20)
BUN: 70 mg/dL — ABNORMAL HIGH (ref 6–20)
CALCIUM: 8.3 mg/dL — AB (ref 8.9–10.3)
CALCIUM: 8.4 mg/dL — AB (ref 8.9–10.3)
CO2: 23 mmol/L (ref 22–32)
CO2: 21 mmol/L — ABNORMAL LOW (ref 22–32)
CREATININE: 3.67 mg/dL — AB (ref 0.44–1.00)
Chloride: 102 mmol/L (ref 101–111)
Chloride: 102 mmol/L (ref 101–111)
Creatinine, Ser: 3.94 mg/dL — ABNORMAL HIGH (ref 0.44–1.00)
GFR, EST AFRICAN AMERICAN: 12 mL/min — AB (ref >60)
GFR, EST AFRICAN AMERICAN: 11 mL/min — AB (ref >60)
GFR, EST NON AFRICAN AMERICAN: 11 mL/min — AB (ref >60)
GFR, EST NON AFRICAN AMERICAN: 10 mL/min — AB (ref >60)
Glucose, Bld: 105 mg/dL — ABNORMAL HIGH (ref 65–99)
Glucose, Bld: 112 mg/dL — ABNORMAL HIGH (ref 65–99)
Potassium: 4.1 mmol/L (ref 3.5–5.1)
Potassium: 4.2 mmol/L (ref 3.5–5.1)
Sodium: 137 mmol/L (ref 135–145)
Sodium: 136 mmol/L (ref 135–145)

## 2017-08-10 LAB — CBC
HEMATOCRIT: 30.3 % — AB (ref 36.0–46.0)
Hemoglobin: 9.8 g/dL — ABNORMAL LOW (ref 12.0–15.0)
MCH: 30.6 pg (ref 26.0–34.0)
MCHC: 32.3 g/dL (ref 30.0–36.0)
MCV: 94.7 fL (ref 78.0–100.0)
PLATELETS: 67 10*3/uL — AB (ref 150–400)
RBC: 3.2 MIL/uL — ABNORMAL LOW (ref 3.87–5.11)
RDW: 21.7 % — AB (ref 11.5–15.5)
WBC: 13.9 10*3/uL — ABNORMAL HIGH (ref 4.0–10.5)

## 2017-08-10 LAB — GLUCOSE, CAPILLARY
GLUCOSE-CAPILLARY: 59 mg/dL — AB (ref 65–99)
GLUCOSE-CAPILLARY: 55 mg/dL — AB (ref 65–99)
GLUCOSE-CAPILLARY: 107 mg/dL — AB (ref 65–99)
Glucose-Capillary: 121 mg/dL — ABNORMAL HIGH (ref 65–99)
Glucose-Capillary: 47 mg/dL — ABNORMAL LOW (ref 65–99)
Glucose-Capillary: 107 mg/dL — ABNORMAL HIGH (ref 65–99)

## 2017-08-10 MED ORDER — DEXTROSE 50 % IV SOLN
INTRAVENOUS | Status: AC
  Filled 2017-08-10: qty 50

## 2017-08-10 MED ORDER — SODIUM CHLORIDE 0.9% FLUSH
3.0000 mL | Freq: Two times a day (BID) | INTRAVENOUS | Status: DC
  Administered 2017-08-10 – 2017-08-20 (×10): 3 mL via INTRAVENOUS

## 2017-08-10 MED ORDER — SODIUM CHLORIDE 0.9 % IV BOLUS (SEPSIS)
500.0000 mL | Freq: Once | INTRAVENOUS | Status: AC
  Administered 2017-08-10: 500 mL via INTRAVENOUS

## 2017-08-10 MED ORDER — SODIUM CHLORIDE 0.9% FLUSH
3.0000 mL | INTRAVENOUS | Status: DC | PRN
  Administered 2017-08-11: 3 mL via INTRAVENOUS
  Filled 2017-08-10: qty 3

## 2017-08-10 NOTE — Progress Notes (Addendum)
PROGRESS NOTE    Danielle Benitez  ZOX:[id number] DOB: Jan 19, 1932 DOA: 08/11/2017 PCP: Joaquim Nam, MD   Brief Narrative: Danielle Benitez is a 84 y.o. female with a medical history of insulin-dependent diabetes mellitus, hypertension, history of CVA, fibrillation on Eliquis, chronic diastolic heart failure, chronic kidney disease stage IV.  She presented with severe left wrist pain found to have a left wrist fracture.  Orthopedic surgery was consulted and opted for nonoperative management.  PT and OT pending.   Assessment & Plan:   Principal Problem:   Left wrist fracture, closed, initial encounter Active Problems:   DM, type 2, with renal complications    Mild CAD - 20-30% RCA in 2013   Depression   CKD (chronic kidney disease), stage IV (HCC)   Essential hypertension   Normocytic anemia   Atrial fibrillation - 07/11/15 by PPM interrogation, recurrent 10/19 after DCCV 10/14   Thrombocytopenia (HCC)   Chronic diastolic CHF (congestive heart failure) (HCC)   History of CVA (cerebrovascular accident)   Occult GI bleeding   HCAP (healthcare-associated pneumonia)   Closed fracture of left wrist   Left wrist fracture Closed. Pain improved. -orthopedic surgery recommendations: non-operative management  HCAP Blood cultures negative to date. Sputum gram stain/culture not available. -continue cefepime and vancomycin -MRSA PCR  Diabetes mellitus, type 2 -continue Lantus -continue SSI  CAD 20-30% RCA -continue atorvastatin  Depression -continue Wellbutrin  CKD stage IV -stable  Essential hypertension -continue metoprolol  Normocytic anemia Stable. Chronic.  Atrial fibrillation Patient with PPM. -continue Eliquis -continue amiodarone and Toprol  Thrombocytopenia Stable. Chronic. Unknown etiology.   Chronic diastolic heart failure Last echo from 04/2017 significant for an EF of 60-65% with grade 1 diastolic dysfunction  History of CVA Stable -continue  Eliquis    DVT prophylaxis: Eliquis Code Status: Full code Family Communication: Daughter and son-in-law at bedside Disposition Plan: Pending PT/OT recommendations   Consultants:   Orthopedic surgery  Procedures:   None  Antimicrobials:  Vancomycin (10/28>>  Cefepime (10/28>>    Subjective: Improvement of pain.  Objective: Vitals:   08/09/17 1252 08/09/17 2047 08/10/17 0430 08/10/17 1329  BP: (!) 122/53 (!) 104/40 (!) 101/52 137/72  Pulse: 68 67 70 69  Resp: 18 17 12    Temp: (!) 97.1 F (36.2 C) (!) 97.5 F (36.4 C) 97.6 F (36.4 C) 97.6 F (36.4 C)  TempSrc: Oral Oral Axillary Oral  SpO2: 96% 97% 96% 100%  Weight:      Height:        Intake/Output Summary (Last 24 hours) at 08/10/17 1459 Last data filed at 08/10/17 0400  Gross per 24 hour  Intake              490 ml  Output             1000 ml  Net             -510 ml   Filed Weights   Aug 11, 2017 2110  Weight: 80.8 kg (178 lb 3.2 oz)    Examination:  General exam: Appears calm and comfortable Respiratory system: Clear to auscultation. Respiratory effort normal. Cardiovascular system: S1 & S2 heard, RRR. No murmurs, rubs, gallops or clicks. Gastrointestinal system: Abdomen is nondistended, soft and nontender. No organomegaly or masses felt. Normal bowel sounds heard. Central nervous system: Alert and oriented to person and place. No focal neurological deficits. Extremities: No edema. No calf tenderness. Left arm in cast Skin: No cyanosis. No rashes Psychiatry: Judgement and  insight appear impaired. Mood & affect appropriate.     Data Reviewed: I have personally reviewed following labs and imaging studies  CBC:  Recent Labs Lab 07/12/2017 2231 08/08/17 0309 08/10/17 1125  WBC 12.4* 9.2 13.9*  NEUTROABS 8.5* 6.2  --   HGB 9.1* 7.6* 9.8*  HCT 28.6* 24.1* 30.3*  MCV 95.7 94.1 94.7  PLT 135* 67* 67*   Basic Metabolic Panel:  Recent Labs Lab 08/08/17 0043 08/08/17 0309 08/10/17 0526   NA 138 136 137  K 3.9 4.1 4.1  CL 99* 101 102  CO2 27 26 23   GLUCOSE 243* 284* 105*  BUN 60* 60* 67*  CREATININE 3.76* 3.51* 3.67*  CALCIUM 8.4* 7.8* 8.3*   GFR: Estimated Creatinine Clearance: 11.4 mL/min (A) (by C-G formula based on SCr of 3.67 mg/dL (H)). Liver Function Tests:  Recent Labs Lab 08/08/17 0043 08/08/17 0309  AST 88* 81*  ALT 34 29  ALKPHOS 191* 158*  BILITOT 3.0* 2.5*  PROT 6.5 5.3*  ALBUMIN 2.2* 1.9*   No results for input(s): LIPASE, AMYLASE in the last 168 hours. No results for input(s): AMMONIA in the last 168 hours. Coagulation Profile: No results for input(s): INR, PROTIME in the last 168 hours. Cardiac Enzymes: No results for input(s): CKTOTAL, CKMB, CKMBINDEX, TROPONINI in the last 168 hours. BNP (last 3 results)  Recent Labs  09/20/16 1458 03/16/17 1441 04/11/17 1345  PROBNP 140.0* 287.0* 215.0*   HbA1C: No results for input(s): HGBA1C in the last 72 hours. CBG:  Recent Labs Lab 08/10/17 0427 08/10/17 0849 08/10/17 1232 08/10/17 1318 08/10/17 1455  GLUCAP 121* 59* 47* 55* 107*   Lipid Profile: No results for input(s): CHOL, HDL, LDLCALC, TRIG, CHOLHDL, LDLDIRECT in the last 72 hours. Thyroid Function Tests: No results for input(s): TSH, T4TOTAL, FREET4, T3FREE, THYROIDAB in the last 72 hours. Anemia Panel: No results for input(s): VITAMINB12, FOLATE, FERRITIN, TIBC, IRON, RETICCTPCT in the last 72 hours. Sepsis Labs:  Recent Labs Lab 08/08/17 0058 08/08/17 0309 08/08/17 0721 08/09/17 0552 08/09/17 1846  PROCALCITON  --  0.22  --  0.26  --   LATICACIDVEN 2.53* 2.0* 2.7*  --  2.1*    Recent Results (from the past 240 hour(s))  Blood culture (routine x 2)     Status: None (Preliminary result)   Collection Time: 08/08/17 12:43 AM  Result Value Ref Range Status   Specimen Description BLOOD RIGHT ARM  Final   Special Requests   Final    BOTTLES DRAWN AEROBIC AND ANAEROBIC Blood Culture adequate volume   Culture NO  GROWTH 2 DAYS  Final   Report Status PENDING  Incomplete  Blood culture (routine x 2)     Status: None (Preliminary result)   Collection Time: 08/08/17  1:44 AM  Result Value Ref Range Status   Specimen Description BLOOD RIGHT HAND  Final   Special Requests   Final    BOTTLES DRAWN AEROBIC ONLY Blood Culture adequate volume   Culture NO GROWTH 2 DAYS  Final   Report Status PENDING  Incomplete         Radiology Studies: No results found.      Scheduled Meds: . allopurinol  200 mg Oral Daily  . amiodarone  200 mg Oral Daily  . apixaban  2.5 mg Oral BID  . atorvastatin  20 mg Oral q1800  . buPROPion  75 mg Oral BID  . dextrose      . insulin aspart  0-9 Units Subcutaneous Q4H  .  insulin glargine  20 Units Subcutaneous QHS  . loratadine  5 mg Oral QPM  . metoprolol succinate  25 mg Oral BID  . pantoprazole  40 mg Oral Daily  . senna-docusate  0.5 tablet Oral Daily  . sodium chloride flush  3 mL Intravenous Q12H  . sodium chloride flush  3 mL Intravenous Q12H   Continuous Infusions: . sodium chloride    . ceFEPime (MAXIPIME) IV Stopped (08/09/17 2249)  . vancomycin Stopped (08/10/17 0244)     LOS: 2 days     Jacquelin Hawking, MD [facility] 08/10/2017, 2:59 PM Pager: [phone]  If 7PM-7AM, please contact night-coverage [email or web address] Password [email or web address] 08/10/2017, 2:59 PM

## 2017-08-10 NOTE — Progress Notes (Signed)
Patient doing well. Splint clean dry and intact. Plan for nonoperative treatment. Follow up with me in 2 weeks for cast and x-rays in office.  Roby Lofts, MD [facility] [phone] (phone)

## 2017-08-10 NOTE — Care Management Note (Signed)
Case Management Note Original Note by: Lawerance Sabal, RN 08/09/2017, 12:14 PM   Patient Details  Name: Danielle Benitez MRN: [id number] Date of Birth: 08-22-1932  Subjective/Objective:                 Patient from home, fell using RW and broke wrist. Splinted, plan is for non surgical treatment. Requested PT OT eval to assist w gait training and needs as patient will not be able to use RW w broken wrist. Spoke w patient and daughter at the bedside. They wish to return home with [facility] continueing through [facility]. Referral placed to Becky Sax, she will schedule SOC tentative for late tomorrow. Spoke w Nida Boatman w [facility] for specifications for ordering WC with movable arms as requested by daugter. Order placed. No other CM needs identified at this time.     Action/Plan:   Expected Discharge Date:                  Expected Discharge Plan:  Home w Home Health Services  In-House Referral:     Discharge planning Services  CM Consult  Post Acute Care Choice:  Resumption of Svcs/PTA Provider Choice offered to:  Adult Children  DME Arranged:  Government social research officer DME Agency:  [facility]  HH Arranged:  RN, PT, OT, Nurse's Aide HH Agency:  [facility]  Status of Service:  Completed, signed off  If discussed at Long Length of Stay Meetings, dates discussed:    Additional Comments:  08/10/17 J. Kidus Delman, RN, BSN Noted ortho planning non-operative tx of wrist fracture, possible dc on 08/10/17.  Will notify [facility] agency when dc established and ensure delivery of WC to room.    Quintella Baton, RN, BSN  Trauma/Neuro ICU Case Manager [phone]

## 2017-08-10 NOTE — Evaluation (Signed)
Physical Therapy Evaluation Patient Details Name: Ned GraceLula V Christine MRN: [id number] DOB: [birth year] Today's Date: 08/10/2017   History of Present Illness  Patient is an 83 y/o female admitted s/p fall with L wrist fx (non-op).  PMH positive for DM, CKD, dCHF, HLD, HTN, A-fib on eliquis, prior h/o stroke, recent admissions with UTI/resp infection.   Clinical Impression  Patient presents with decreased mobility due to pain, limited UE use for mobility with h/o weakness and falls at home.  She will benefit from skilled PT in the acute setting and follow up CIR level rehab to allow maximized mobility and reduced burden of care prior to d/c home with family support.   Follow Up Recommendations CIR    Equipment Recommendations  Wheelchair (measurements PT);Wheelchair cushion (measurements PT)    Recommendations for Other Services Rehab consult     Precautions / Restrictions Precautions Precautions: Fall Precaution Comments: multiple falls at home Required Braces or Orthoses: Sling Restrictions Weight Bearing Restrictions: Yes LUE Weight Bearing: Non weight bearing      Mobility  Bed Mobility Overal bed mobility: Needs Assistance Bed Mobility: Supine to Sit     Supine to sit: HOB elevated;Max assist     General bed mobility comments: assist to scoot to EOB and lift trunk  Transfers Overall transfer level: Needs assistance   Transfers: Sit to/from Stand;Stand Pivot Transfers Sit to Stand: Mod assist   Squat pivot transfers: Mod assist     General transfer comment: assist to pivot with support at waist to step to chair  Ambulation/Gait                Stairs            Wheelchair Mobility    Modified Rankin (Stroke Patients Only)       Balance Overall balance assessment: Needs assistance   Sitting balance-Leahy Scale: Poor Sitting balance - Comments: leans to R     Standing balance-Leahy Scale: Poor                                Pertinent Vitals/Pain Pain Assessment: 0-10 Pain Score: 5  Pain Location: left arm Pain Descriptors / Indicators: Sore Pain Intervention(s): Monitored during session    Home Living Family/patient expects to be discharged to:: Private residence Living Arrangements: Children Available Help at Discharge: Family;Available 24 hours/day Type of Home: Apartment Home Access: Level entry     Home Layout: One level Home Equipment: Walker - 4 wheels;Shower seat;Grab bars - tub/shower;Walker - 2 wheels;Cane - quad;Bedside commode;Hand held shower head Additional Comments: has been sponge bathing and has had HH aide, was getting HHPT    Prior Function Level of Independence: Needs assistance   Gait / Transfers Assistance Needed: supervision for household ambulation with rollator, has a bad knee with arthritis that slips and she falls  ADL's / Homemaking Assistance Needed: assist with showering  Comments: Daughter works from home and provides 24/7 supervision; currently getting HHPT      Hand Dominance   Dominant Hand: Right    Extremity/Trunk Assessment   Upper Extremity Assessment Upper Extremity Assessment: LUE deficits/detail LUE Deficits / Details: casted hand to above elbow, difficutly lifting cast, utilized sling    Lower Extremity Assessment Lower Extremity Assessment: Generalized weakness       Communication   Communication: HOH  Cognition Arousal/Alertness: Awake/alert Behavior During Therapy: WFL for tasks assessed/performed Overall Cognitive Status: Within Functional Limits for tasks  assessed                                        General Comments General comments (skin integrity, edema, etc.): daughter in room and giving history as well as discussed plans for d/c thoughts about CIR or home    Exercises     Assessment/Plan    PT Assessment Patient needs continued PT services  PT Problem List Decreased strength;Decreased activity  tolerance;Decreased balance;Decreased knowledge of use of DME;Pain;Decreased mobility;Decreased safety awareness       PT Treatment Interventions DME instruction;Therapeutic exercise;Patient/family education;Therapeutic activities;Gait training;Balance training;Functional mobility training;Wheelchair mobility training    PT Goals (Current goals can be found in the Care Plan section)  Acute Rehab PT Goals Patient Stated Goal: To potentially go to rehab PT Goal Formulation: With patient/family Time For Goal Achievement: 08/17/17 Potential to Achieve Goals: Fair    Frequency Min 3X/week   Barriers to discharge        Co-evaluation               AM-PAC PT "6 Clicks" Daily Activity  Outcome Measure Difficulty turning over in bed (including adjusting bedclothes, sheets and blankets)?: A Lot Difficulty moving from lying on back to sitting on the side of the bed? : Unable Difficulty sitting down on and standing up from a chair with arms (e.g., wheelchair, bedside commode, etc,.)?: Unable Help needed moving to and from a bed to chair (including a wheelchair)?: A Lot Help needed walking in hospital room?: A Lot Help needed climbing 3-5 steps with a railing? : Total 6 Click Score: 9    End of Session Equipment Utilized During Treatment: Gait belt Activity Tolerance: Patient limited by fatigue Patient left: in chair;with call bell/phone within reach;with family/visitor present Nurse Communication: Mobility status PT Visit Diagnosis: Unsteadiness on feet (R26.81);History of falling (Z91.81);Other abnormalities of gait and mobility (R26.89);Pain Pain - Right/Left: Left Pain - part of body: Arm    Time: 1700-1735 PT Time Calculation (min) (ACUTE ONLY): 35 min   Charges:   PT Evaluation $PT Eval Moderate Complexity: 1 Mod PT Treatments $Therapeutic Activity: 8-22 mins   PT G CodesSheran Lawless, [place] [phone] 08/10/2017   Elray Mcgregor 08/10/2017, 5:57 PM

## 2017-08-11 ENCOUNTER — Inpatient Hospital Stay (HOSPITAL_COMMUNITY): Payer: Medicare Other

## 2017-08-11 DIAGNOSIS — S62102A Fracture of unspecified carpal bone, left wrist, initial encounter for closed fracture: Secondary | ICD-10-CM

## 2017-08-11 DIAGNOSIS — W19XXXA Unspecified fall, initial encounter: Secondary | ICD-10-CM

## 2017-08-11 DIAGNOSIS — N179 Acute kidney failure, unspecified: Secondary | ICD-10-CM

## 2017-08-11 DIAGNOSIS — I5032 Chronic diastolic (congestive) heart failure: Secondary | ICD-10-CM

## 2017-08-11 LAB — BASIC METABOLIC PANEL
ANION GAP: 17 — AB (ref 5–15)
BUN: 75 mg/dL — ABNORMAL HIGH (ref 6–20)
CHLORIDE: 105 mmol/L (ref 101–111)
CO2: 16 mmol/L — AB (ref 22–32)
Calcium: 8.1 mg/dL — ABNORMAL LOW (ref 8.9–10.3)
Creatinine, Ser: 3.99 mg/dL — ABNORMAL HIGH (ref 0.44–1.00)
GFR calc non Af Amer: 10 mL/min — ABNORMAL LOW (ref >60)
GFR, EST AFRICAN AMERICAN: 11 mL/min — AB (ref >60)
Glucose, Bld: 81 mg/dL (ref 65–99)
POTASSIUM: 4.6 mmol/L (ref 3.5–5.1)
Sodium: 138 mmol/L (ref 135–145)

## 2017-08-11 LAB — GLUCOSE, CAPILLARY
GLUCOSE-CAPILLARY: 105 mg/dL — AB (ref 65–99)
GLUCOSE-CAPILLARY: 87 mg/dL (ref 65–99)
GLUCOSE-CAPILLARY: 74 mg/dL (ref 65–99)
GLUCOSE-CAPILLARY: 102 mg/dL — AB (ref 65–99)
Glucose-Capillary: 95 mg/dL (ref 65–99)
Glucose-Capillary: 56 mg/dL — ABNORMAL LOW (ref 65–99)
Glucose-Capillary: 62 mg/dL — ABNORMAL LOW (ref 65–99)
Glucose-Capillary: 84 mg/dL (ref 65–99)

## 2017-08-11 LAB — SODIUM, URINE, RANDOM: Sodium, Ur: <10 mmol/L

## 2017-08-11 LAB — CREATININE, URINE, RANDOM: Creatinine, Urine: 124.33 mg/dL

## 2017-08-11 LAB — MRSA PCR SCREENING: MRSA by PCR: NEGATIVE

## 2017-08-11 MED ORDER — BISACODYL 10 MG RE SUPP: 10.0000 mg | Freq: Every day | RECTAL | Status: DC | PRN

## 2017-08-11 MED ORDER — ONDANSETRON HCL 4 MG/2ML IJ SOLN
4.0000 mg | Freq: Three times a day (TID) | INTRAMUSCULAR | Status: DC | PRN
  Administered 2017-08-11 – 2017-08-15 (×4): 4 mg via INTRAVENOUS
  Filled 2017-08-11 (×5): qty 2

## 2017-08-11 MED ORDER — SODIUM CHLORIDE 0.9 % IV SOLN
INTRAVENOUS | Status: DC
  Administered 2017-08-11 – 2017-08-12 (×2): via INTRAVENOUS

## 2017-08-11 MED ORDER — METOPROLOL SUCCINATE ER 25 MG PO TB24: 25.0000 mg | ORAL_TABLET | Freq: Every day | ORAL | Status: DC

## 2017-08-11 MED ORDER — POLYETHYLENE GLYCOL 3350 17 G PO PACK
17.0000 g | PACK | Freq: Every day | ORAL | Status: DC
  Administered 2017-08-11 – 2017-08-12 (×2): 17 g via ORAL
  Filled 2017-08-11 (×4): qty 1

## 2017-08-11 NOTE — Progress Notes (Signed)
Pharmacy Antibiotic Note  Danielle Benitez is a 83 y.o. female with CKD admitted on 07/19/2017 after fall, found to have HCAP.  Pharmacy has been consulted for vancomycin dosing.  Patient is also on cefepime.  Day #4 of abx for PNA. Has had increased cough but no other signs of infection. CXR shows atelectasis vs infiltrate. PCT is not impressive. Afebrile, WBC 13.9  Plan: Continue vancomycin 1000mg  IV Q48H Continue cefepime 1gm IV Q24H per MD Monitor clinical picture, renal function, VT at Css F/U C&S, abx deescalation / LOT  Consider de-escalating abx today to PO for remainder of 7 day course?   Height: 5\' 2"  (157.5 cm) Weight: 178 lb 3.2 oz (80.8 kg) IBW/kg (Calculated) : 50.1  Temp (24hrs), Avg:97.6 F (36.4 C), Min:97.6 F (36.4 C), Max:97.7 F (36.5 C)   Recent Labs Lab 07/12/2017 2231 08/08/17 0043 08/08/17 0058 08/08/17 0309 08/08/17 0721 08/09/17 1846 08/10/17 0526 08/10/17 1125 08/10/17 1852 08/11/17 0603  WBC 12.4*  --   --  9.2  --   --   --  13.9*  --   --   CREATININE  --  3.76*  --  3.51*  --   --  3.67*  --  3.94* 3.99*  LATICACIDVEN  --   --  2.53* 2.0* 2.7* 2.1*  --   --   --   --     Estimated Creatinine Clearance: 10.5 mL/min (A) (by C-G formula based on SCr of 3.99 mg/dL (H)).    Allergies  Allergen Reactions  . Beta Adrenergic Blockers Other (See Comments)    Couldn't speak or hear Metoprolol seems ok  . Jardiance [Empagliflozin] Other (See Comments)    Rash, kidney issues  . Lexapro [Escitalopram Oxalate] Other (See Comments)    Lack of effect.   Marland Kitchen Spironolactone Other (See Comments)    Hyperkalemia  . Zoloft [Sertraline Hcl] Other (See Comments)    Lack of effect.     Enzo Bi, PharmD, BCPS Clinical Pharmacist Pager [phone] 08/11/2017 8:39 AM

## 2017-08-11 NOTE — Evaluation (Signed)
Clinical/Bedside Swallow Evaluation Patient Details  Name: RUPINDER VICKNAIR MRN: [id number] Date of Birth: [birth year]  Today's Date: 08/11/2017 Time: SLP Start Time (ACUTE ONLY): 1500 SLP Stop Time (ACUTE ONLY): 1515 SLP Time Calculation (min) (ACUTE ONLY): 15 min  Past Medical History:  Past Medical History:  Diagnosis Date  . Abnormality of gait   . Anemia, unspecified   . Anticoagulated- Eliquis 07/31/2015  . Anxiety state, unspecified   . Aortic stenosis    a. Mild by echo 08/2013, not seen on repeat echo 02/2017.  Marland Kitchen Bifascicular block    afib  . Carotid artery disease (HCC)    a. Carotid dopplers (9/17) with 40-59% LICA stenosis (of note, prior ABI normal).  . Chronic diastolic CHF (congestive heart failure) (HCC)    a. Dx 07/2015 - EF 35-40%, unclear etiology ? r/t afib or viral etiology. b. EF normal in 02/2017.  . CKD (chronic kidney disease), stage III (HCC)   . Depressive disorder, not elsewhere classified   . DM (diabetes mellitus), type 2, uncontrolled, with renal complications (HCC) 01/08/2012  . Eczema 11/22/2013  . Esophageal dysmotility 08/05/2015  . Essential hypertension   . Female stress incontinence 05/06/2015  . Full dentures   . GERD (gastroesophageal reflux disease)   . Gout   . HOH (hard of hearing)   . Internal hemorrhoids without mention of complication   . Iron deficiency   . Lumbago   . Memory loss   . Mild CAD    a. Cath 2013: 20-30% prox RCA.  Marland Kitchen Myocardial infarction (HCC) 07/2015  . Nonspecific abnormal results of liver function study   . Obesity, unspecified   . Orthostatic hypotension   . Osteoarthritis   . Other and unspecified hyperlipidemia   . PAF (paroxysmal atrial fibrillation) (HCC) 07/11/2015   a. prior DCCVs, amiodarone, anticoagulation.  . Pain in joint, shoulder region 11/22/2013   left   . Personal history of fall   . Presence of permanent cardiac pacemaker   . Pruritus 11/22/2013  . S/P cardiac pacemaker procedure 01/11/2012   a.  for symptomatic bradycardia. Followed by Dr. Ladona Ridgel. Device changed out 2017 due to ppm infection.  . Stroke [facility])    a. evidence of prior strokes on MRI in 2018.  Marland Kitchen UTI (urinary tract infection) 04/2017  . Wears glasses    Past Surgical History:  Past Surgical History:  Procedure Laterality Date  . CARDIAC CATHETERIZATION  12/03/2003   Dr Clarene Duke  . CARDIOVERSION N/A 07/25/2015   Procedure: CARDIOVERSION;  Surgeon: Pricilla Riffle, MD;  Location: [facility] ENDOSCOPY;  Service: Cardiovascular;  Laterality: N/A;  . CARDIOVERSION N/A 08/06/2015   Procedure: CARDIOVERSION;  Surgeon: Laurey Morale, MD;  Location: [facility] ENDOSCOPY;  Service: Cardiovascular;  Laterality: N/A;  . CATARACT EXTRACTION W/ INTRAOCULAR LENS  IMPLANT, BILATERAL Bilateral 03/2015-04/2015  . COLONOSCOPY     ?????  . EP IMPLANTABLE DEVICE N/A 04/15/2016   Procedure: Pacemaker Implant;  Surgeon: Marinus Maw, MD;  Location: [facility] INVASIVE CV LAB;  Service: Cardiovascular;  Laterality: N/A;  . ESOPHAGOGASTRODUODENOSCOPY N/A 08/08/2015   Procedure: ESOPHAGOGASTRODUODENOSCOPY (EGD);  Surgeon: Iva Boop, MD;  Location: [facility] ENDOSCOPY;  Service: Endoscopy;  Laterality: N/A;  . EXTREMITY CYST EXCISION     finger  . EYE SURGERY Bilateral    catartact  . INSERT / REPLACE / REMOVE PACEMAKER    . LEFT HEART CATHETERIZATION WITH CORONARY ANGIOGRAM N/A 01/10/2012   Procedure: LEFT HEART CATHETERIZATION WITH CORONARY ANGIOGRAM;  Surgeon: Iantha Fallen  C. Hilty, MD;  Location: [facility] CATH LAB;  Service: Cardiovascular;  Laterality: N/A;  . ORIF WRIST FRACTURE Right 04/09/2014   Procedure: OPEN REDUCTION INTERNAL FIXATION (ORIF) RIGHT DISPLACED  DISTAL RADIUS  FRACTURE;  Surgeon: Dominica Severin, MD;  Location: [facility];  Service: Orthopedics;  Laterality: Right;  . PACEMAKER LEAD REMOVAL  04/12/2016   TEMPORARY PACEMAKER INSERTION  . PACEMAKER LEAD REMOVAL  04/15/2016   Procedure: Pacemaker Lead Removal;  Surgeon: Marinus Maw, MD;  Location:  [facility] INVASIVE CV LAB;  Service: Cardiovascular;;  . PACEMAKER LEAD REMOVAL N/A 04/12/2016   Procedure: PACEMAKER LEAD REMOVAL;  Surgeon: Marinus Maw, MD;  Location: [facility] OR;  Service: Cardiovascular;  Laterality: N/A;  . PERMANENT PACEMAKER INSERTION Left 01/11/2012   Procedure: PERMANENT PACEMAKER INSERTION;  Surgeon: Thurmon Fair, MD;  Location: [facility] CATH LAB;  Service: Cardiovascular;  Laterality: Left;  . TEE WITHOUT CARDIOVERSION N/A 07/25/2015   Procedure: TRANSESOPHAGEAL ECHOCARDIOGRAM (TEE);  Surgeon: Pricilla Riffle, MD;  Location: [facility] ENDOSCOPY;  Service: Cardiovascular;  Laterality: N/A;   HPI:  Cierria Height Dunnis a 83 y.o.female with a medical history of insulin-dependent diabetes mellitus, hypertension, history of CVA, fibrillation on Eliquis, chronic diastolic heart failure, chronic kidney disease stage IV. She presented with severe left wrist pain found to have a left wrist fracture. Orthopedic surgery was consulted and opted for nonoperative management. Now with HCAP (10/28 CXR: low lung volumes with patchy atelectasis or infiltrate a the left lung base), emesis with oral intake. Improved with Zofran. Per MD, likely contributing to weight loss. esophageal ordered and complete 11/1-diffuse esophageal spasm, no esophageal stricture, normal pharyngeal anatomy and motility. Seen at bedside by SLP in 03/2017 with recommendations for a regular diet, thin liquid, noting normal oropharyngeal swallowing function, globus.    Assessment / Plan / Recommendation Clinical Impression  Patient presents with a suspected primary esophageal dysphagia characterized by c/o globus, frequent belching, and occassional complaints of gagging/regurgitation consistent with h/o esophageal spasms and GER. Oropharyngeal stages of swallow appear WFL with no overt indication of aspiration. If PNAs become recurrent and unexplained in nature, may be beneficial to complete MBS to evaluate esophageal function on pharyngeal phase of  swallow however at this time not deemed necessary. Education complete with patient and family.  SLP Visit Diagnosis: Dysphagia, unspecified (R13.10)    Aspiration Risk  Mild aspiration risk    Diet Recommendation Regular;Thin liquid   Liquid Administration via: Cup;Straw Medication Administration: Whole meds with puree Supervision: Patient able to self feed Compensations: Slow rate;Small sips/bites;Follow solids with liquid Postural Changes: Remain upright for at least 30 minutes after po intake;Seated upright at 90 degrees    Other  Recommendations Oral Care Recommendations: Oral care BID     Swallow Study   General HPI: Eylin Pontarelli Dunnis a 83 y.o.female with a medical history of insulin-dependent diabetes mellitus, hypertension, history of CVA, fibrillation on Eliquis, chronic diastolic heart failure, chronic kidney disease stage IV. She presented with severe left wrist pain found to have a left wrist fracture. Orthopedic surgery was consulted and opted for nonoperative management. Now with HCAP (10/28 CXR: low lung volumes with patchy atelectasis or infiltrate a the left lung base), emesis with oral intake. Improved with Zofran. Per MD, likely contributing to weight loss. esophageal ordered and complete 11/1-diffuse esophageal spasm, no esophageal stricture, normal pharyngeal anatomy and motility. Seen at bedside by SLP in 03/2017 with recommendations for a regular diet, thin liquid, noting normal oropharyngeal swallowing function, globus.  Type of Study: Bedside Swallow Evaluation Previous Swallow Assessment: see HPI Diet Prior to this Study: Regular;Thin liquids Temperature Spikes Noted: No Respiratory Status: Room air History of Recent Intubation: No Behavior/Cognition: Alert;Cooperative;Pleasant mood Oral Cavity Assessment: Within Functional Limits Oral Care Completed by SLP: Recent completion by staff Oral Cavity - Dentition: Dentures, top;Dentures, bottom Vision: Functional for  self-feeding Self-Feeding Abilities: Able to feed self Patient Positioning: Upright in bed Baseline Vocal Quality: Normal Volitional Cough: Strong Volitional Swallow: Able to elicit    Oral/Motor/Sensory Function Overall Oral Motor/Sensory Function: Within functional limits   Ice Chips Ice chips: Not tested   Thin Liquid Thin Liquid: Within functional limits Presentation: Self Fed;Straw    Nectar Thick Nectar Thick Liquid: Not tested   Honey Thick Honey Thick Liquid: Not tested   Puree Puree: Not tested   Solid   Marili Vader MA, CCC-SLP [phone]    Solid: Within functional limits Presentation: Self Fed        Lalia Loudon Meryl 08/11/2017,3:16 PM

## 2017-08-11 NOTE — Progress Notes (Signed)
OT Cancellation Note  Patient Details Name: Danielle Benitez MRN: [id number] DOB: 10-26-1931   Cancelled Treatment:    Reason Eval/Treat Not Completed: Patient at procedure or test/ unavailable; Pt currently off unit for ultrasound, will plan to follow up this afternoon for OT eval.   Marcy Siren, OT Pager [phone] 08/11/2017   [name] 08/11/2017, 12:02 PM

## 2017-08-11 NOTE — Progress Notes (Signed)
PROGRESS NOTE    Danielle GraceLula V Ballowe  RUE:[id number] DOB: January 11, 1932 DOA: 08/06/2017 PCP: Joaquim Namuncan, [name], MD   Brief Narrative: Danielle Benitez is a 83 y.o. female with a medical history of insulin-dependent diabetes mellitus, hypertension, history of CVA, fibrillation on Eliquis, chronic diastolic heart failure, chronic kidney disease stage IV.  She presented with severe left wrist pain found to have a left wrist fracture.  Orthopedic surgery was consulted and opted for nonoperative management.  PT and OT pending.   Assessment & Plan:   Principal Problem:   Left wrist fracture, closed, initial encounter Active Problems:   DM, type 2, with renal complications    Mild CAD - 20-30% RCA in 2013   Depression   CKD (chronic kidney disease), stage IV (HCC)   Essential hypertension   Normocytic anemia   Atrial fibrillation - 07/11/15 by PPM interrogation, recurrent 10/19 after DCCV 10/14   Thrombocytopenia (HCC)   Chronic diastolic CHF (congestive heart failure) (HCC)   History of CVA (cerebrovascular accident)   Occult GI bleeding   HCAP (healthcare-associated pneumonia)   Closed fracture of left wrist   Fall   Left wrist fracture Closed fracture. Pain improved. -orthopedic surgery recommendations: non-operative management -continue Norco prn  HCAP Blood cultures negative to date. Sputum gram stain/culture not available. -continue cefepime and vancomycin -MRSA PCR; discontinue vancomycin if negative.  Diabetes mellitus, type 2 -continue Lantus -continue SSI  CAD 20-30% RCA -continue atorvastatin  Depression -continue Wellbutrin  Acute kidney injury on CKD stage IV No significant signs of uremia. Baseline creatinine of 2.2-2.5. Urine output about 500 mL over the last 24 hours. This is likely secondary to hypovolemia in the setting of recurrent emesis, diuresis and decreased oral intake. Creatinine continues to rise, in addition to BUN. -nephrology consult -IV fluids -renal  ultrasound to rule out hydronephrosis  Essential hypertension -continue metoprolol  Normocytic anemia Stable. Chronic.  Atrial fibrillation Patient with PPM. -continue Eliquis -continue amiodarone and Toprol  Thrombocytopenia Stable. Chronic. Unknown etiology.   Chronic diastolic heart failure Last echo from 04/2017 significant for an EF of 60-65% with grade 1 diastolic dysfunction  History of CVA Stable -continue Eliquis  History of dysphagia Emesis Patient with an EGD back in 2016 significant for dysmotility. Dysphagia 3 diet recommended at that time. Now with symptoms of emesis with oral intake. Improved with Zofran. This appears to be contributing to her overall recent weight loss. -continue Zofran -continue Protonix -SLP eval -DG esophagram   DVT prophylaxis: Eliquis Code Status: Full code Family Communication: Daughter and son-in-law at bedside Disposition Plan: CIR/SNF   Consultants:   Orthopedic surgery  Procedures:   None  Antimicrobials:  Vancomycin (10/28>>  Cefepime (10/28>>    Subjective: Recurrent emesis with food intake. Some epigastric pain.  Objective: Vitals:   08/10/17 2206 08/11/17 0636 08/11/17 0933 08/11/17 0942  BP: (!) 118/56 (!) 111/57 (!) 114/92   Pulse: 67 70 69 69  Resp: 16 16 18    Temp: 97.7 F (36.5 C) 97.6 F (36.4 C) 97.6 F (36.4 C)   TempSrc: Oral Oral Oral   SpO2: 92% 93% 100%   Weight:      Height:        Intake/Output Summary (Last 24 hours) at 08/11/17 1145 Last data filed at 08/10/17 1900  Gross per 24 hour  Intake              480 ml  Output  200 ml  Net              280 ml   Filed Weights   07/20/2017 2110  Weight: 80.8 kg (178 lb 3.2 oz)    Examination:  General exam: Appears calm and comfortable Respiratory system: Clear to auscultation. Respiratory effort normal. Cardiovascular system: Regular rate and rhythm. Normal S1 and S2. No heart murmurs present. No extra heart  sounds Gastrointestinal system: Abdomen is nondistended, soft and nontender. No organomegaly or masses felt. Normal bowel sounds heard. Central nervous system: Alert and oriented to person and place. No focal neurological deficits. Extremities: No edema. No calf tenderness. Left arm in cast Skin: No cyanosis. No rashes Psychiatry: Judgement and insight appear impaired. Mood & affect appropriate.     Data Reviewed: I have personally reviewed following labs and imaging studies  CBC:  Recent Labs Lab 07/13/2017 2231 08/08/17 0309 08/10/17 1125  WBC 12.4* 9.2 13.9*  NEUTROABS 8.5* 6.2  --   HGB 9.1* 7.6* 9.8*  HCT 28.6* 24.1* 30.3*  MCV 95.7 94.1 94.7  PLT 135* 67* 67*   Basic Metabolic Panel:  Recent Labs Lab 08/08/17 0043 08/08/17 0309 08/10/17 0526 08/10/17 1852 08/11/17 0603  NA 138 136 137 136 138  K 3.9 4.1 4.1 4.2 4.6  CL 99* 101 102 102 105  CO2 27 26 23  21* 16*  GLUCOSE 243* 284* 105* 112* 81  BUN 60* 60* 67* 70* 75*  CREATININE 3.76* 3.51* 3.67* 3.94* 3.99*  CALCIUM 8.4* 7.8* 8.3* 8.4* 8.1*   GFR: Estimated Creatinine Clearance: 10.5 mL/min (A) (by C-G formula based on SCr of 3.99 mg/dL (H)). Liver Function Tests:  Recent Labs Lab 08/08/17 0043 08/08/17 0309  AST 88* 81*  ALT 34 29  ALKPHOS 191* 158*  BILITOT 3.0* 2.5*  PROT 6.5 5.3*  ALBUMIN 2.2* 1.9*   No results for input(s): LIPASE, AMYLASE in the last 168 hours. No results for input(s): AMMONIA in the last 168 hours. Coagulation Profile: No results for input(s): INR, PROTIME in the last 168 hours. Cardiac Enzymes: No results for input(s): CKTOTAL, CKMB, CKMBINDEX, TROPONINI in the last 168 hours. BNP (last 3 results)  Recent Labs  09/20/16 1458 03/16/17 1441 04/11/17 1345  PROBNP 140.0* 287.0* 215.0*   HbA1C: No results for input(s): HGBA1C in the last 72 hours. CBG:  Recent Labs Lab 08/10/17 1739 08/10/17 2046 08/11/17 0223 08/11/17 0427 08/11/17 0747  GLUCAP 105* 107* 95  87 74   Lipid Profile: No results for input(s): CHOL, HDL, LDLCALC, TRIG, CHOLHDL, LDLDIRECT in the last 72 hours. Thyroid Function Tests: No results for input(s): TSH, T4TOTAL, FREET4, T3FREE, THYROIDAB in the last 72 hours. Anemia Panel: No results for input(s): VITAMINB12, FOLATE, FERRITIN, TIBC, IRON, RETICCTPCT in the last 72 hours. Sepsis Labs:  Recent Labs Lab 08/08/17 0058 08/08/17 0309 08/08/17 0721 08/09/17 0552 08/09/17 1846  PROCALCITON  --  0.22  --  0.26  --   LATICACIDVEN 2.53* 2.0* 2.7*  --  2.1*    Recent Results (from the past 240 hour(s))  Blood culture (routine x 2)     Status: None (Preliminary result)   Collection Time: 08/08/17 12:43 AM  Result Value Ref Range Status   Specimen Description BLOOD RIGHT ARM  Final   Special Requests   Final    BOTTLES DRAWN AEROBIC AND ANAEROBIC Blood Culture adequate volume   Culture NO GROWTH 3 DAYS  Final   Report Status PENDING  Incomplete  Blood culture (routine x  2)     Status: None (Preliminary result)   Collection Time: 08/08/17  1:44 AM  Result Value Ref Range Status   Specimen Description BLOOD RIGHT HAND  Final   Special Requests   Final    BOTTLES DRAWN AEROBIC ONLY Blood Culture adequate volume   Culture NO GROWTH 2 DAYS  Final   Report Status PENDING  Incomplete         Radiology Studies: No results found.      Scheduled Meds: . allopurinol  200 mg Oral Daily  . amiodarone  200 mg Oral Daily  . apixaban  2.5 mg Oral BID  . atorvastatin  20 mg Oral q1800  . buPROPion  75 mg Oral BID  . insulin aspart  0-9 Units Subcutaneous Q4H  . insulin glargine  20 Units Subcutaneous QHS  . loratadine  5 mg Oral QPM  . metoprolol succinate  25 mg Oral BID  . pantoprazole  40 mg Oral Daily  . polyethylene glycol  17 g Oral Daily  . senna-docusate  0.5 tablet Oral Daily  . sodium chloride flush  3 mL Intravenous Q12H  . sodium chloride flush  3 mL Intravenous Q12H   Continuous Infusions: . sodium  chloride    . sodium chloride    . ceFEPime (MAXIPIME) IV 1 g (08/11/17 0221)  . vancomycin Stopped (08/10/17 0244)     LOS: 3 days     Jacquelin Hawking, MD [facility] 08/11/2017, 11:45 AM Pager: [phone]  If 7PM-7AM, please contact night-coverage [email or web address] Password [email or web address] 08/11/2017, 11:45 AM

## 2017-08-11 NOTE — Progress Notes (Signed)
Orthopedic Tech Progress Note Patient Details:  Danielle Benitez 10-18-1931 [id number]  Ortho Devices Type of Ortho Device: Arm sling Ortho Device/Splint Location: Left Arm,   floor nurse requested another arm sling for pt because pt soiled previous arm sling.  Arm sling applied to left arm pt toleraed application.  Ortho Device/Splint Interventions: Application, Adjustment   Alvina Chou 08/11/2017, 9:57 PM

## 2017-08-11 NOTE — Progress Notes (Signed)
Rehab Admissions Coordinator Note:  Patient was screened by Trish Mage for appropriateness for an Inpatient Acute Rehab Consult.  At this time, we are recommending Skilled Nursing Facility or [facility].  It is doubtful that [facility] insurance carrier would authorize an inpatient rehab admission given current diagnosis.  Trish Mage 08/11/2017, 8:31 AM  I can be reached at [phone].

## 2017-08-11 NOTE — Evaluation (Signed)
Occupational Therapy Evaluation Patient Details Name: Danielle Benitez MRN: [id number] DOB: 1931-12-20 Today's Date: 08/11/2017    History of Present Illness Patient is an 83 y/o female admitted s/p fall with L wrist fx (non-op).  PMH positive for DM, CKD, dCHF, HLD, HTN, A-fib on eliquis, prior h/o stroke, recent admissions with UTI/resp infection.    Clinical Impression   This 83 y/o F presents with the above. At baseline Pt was using rollator for functional mobility, receiving assistance from daughter for ADL completion with a history of falling. [name] family present during session and very supportive. Pt presents with increased weakness, pain in LUE, decreased sitting and standing balance. Pt completed stand pivot transfer with MaxA+2 this session, requires MaxA for ADLs. Pt will benefit from continued acute OT services and feel Pt is appropriate for CIR services at discharge to progress [name] safety and independence with ADLs and mobility.     Follow Up Recommendations  CIR;Supervision/Assistance - 24 hour    Equipment Recommendations  Other (comment) (defer to next venue )           Precautions / Restrictions Precautions Precautions: Fall Precaution Comments: multiple falls at home Required Braces or Orthoses: Sling Restrictions Weight Bearing Restrictions: Yes LUE Weight Bearing: Non weight bearing      Mobility Bed Mobility Overal bed mobility: Needs Assistance Bed Mobility: Supine to Sit     Supine to sit: HOB elevated;Max assist     General bed mobility comments: assist to scoot to EOB and lift trunk  Transfers Overall transfer level: Needs assistance   Transfers: Sit to/from Stand;Stand Pivot Transfers Sit to Stand: Max assist;+2 physical assistance;+2 safety/equipment Stand pivot transfers: Max assist Squat pivot transfers: +2 physical assistance;+2 safety/equipment     General transfer comment: assist to rise and take small pivotal steps to recliner; Pt not  able to bring trunk fully upright during transfer    Balance Overall balance assessment: Needs assistance   Sitting balance-Leahy Scale: Poor Sitting balance - Comments: leans to R     Standing balance-Leahy Scale: Poor                             ADL either performed or assessed with clinical judgement   ADL Overall ADL's : Needs assistance/impaired Eating/Feeding: Set up;Sitting   Grooming: Sitting;Minimal assistance   Upper Body Bathing: Minimal assistance;Sitting   Lower Body Bathing: Maximal assistance;Sit to/from stand;+2 for physical assistance;+2 for safety/equipment   Upper Body Dressing : Moderate assistance;Sitting   Lower Body Dressing: Maximal assistance;+2 for physical assistance;+2 for safety/equipment;Sit to/from stand   Toilet Transfer: Maximal assistance;+2 for physical assistance;+2 for safety/equipment;Stand-pivot Toilet Transfer Details (indicate cue type and reason): simulated in transfer to recliner  Toileting- Clothing Manipulation and Hygiene: Maximal assistance;+2 for physical assistance;+2 for safety/equipment;Sit to/from stand       Functional mobility during ADLs: Maximal assistance;+2 for physical assistance;+2 for safety/equipment General ADL Comments: educated Pt/[name] family on compensatory techniques for completing ADLs; [name] family appears very supportive; required +2 max assist for stand pivot to recliner; educated on edema reduction techniques                          Pertinent Vitals/Pain Pain Assessment: Faces Faces Pain Scale: Hurts even more Pain Location: left arm Pain Descriptors / Indicators: Sore;Grimacing Pain Intervention(s): Limited activity within patient's tolerance;Monitored during session     Hand Dominance Right  Extremity/Trunk Assessment Upper Extremity Assessment Upper Extremity Assessment: LUE deficits/detail LUE Deficits / Details: casted hand to above elbow, difficutly lifting cast,  utilized sling   Lower Extremity Assessment Lower Extremity Assessment: Defer to PT evaluation       Communication Communication Communication: HOH   Cognition Arousal/Alertness: Awake/alert Behavior During Therapy: WFL for tasks assessed/performed Overall Cognitive Status: Within Functional Limits for tasks assessed                                                      Home Living Family/patient expects to be discharged to:: Private residence Living Arrangements: Children Available Help at Discharge: Family;Available 24 hours/day Type of Home: Apartment Home Access: Level entry     Home Layout: One level     Bathroom Shower/Tub: Tub/shower unit;Curtain   Bathroom Toilet: Standard Bathroom Accessibility: No   Home Equipment: Walker - 4 wheels;Shower seat;Grab bars - tub/shower;Walker - 2 wheels;Cane - quad;Bedside commode;Hand held shower head   Additional Comments: has been sponge bathing and has had HH aide, was getting HHPT      Prior Functioning/Environment Level of Independence: Needs assistance  Gait / Transfers Assistance Needed: supervision for household ambulation with rollator, has a bad knee with arthritis that slips and she falls ADL's / Homemaking Assistance Needed: assist with showering from daughter    Comments: Daughter works from home and provides 24/7 supervision; currently getting HHPT         OT Problem List: Impaired UE functional use;Decreased activity tolerance;Decreased range of motion;Decreased strength;Impaired balance (sitting and/or standing);Decreased knowledge of precautions;Pain      OT Treatment/Interventions: Self-care/ADL training;DME and/or AE instruction;Therapeutic activities;Balance training;Energy conservation;Patient/family education    OT Goals(Current goals can be found in the care plan section) Acute Rehab OT Goals Patient Stated Goal: To potentially go to rehab OT Goal Formulation: With  patient/family Time For Goal Achievement: 08/25/17 Potential to Achieve Goals: Good  OT Frequency: Min 2X/week                             AM-PAC PT "6 Clicks" Daily Activity     Outcome Measure Help from another person eating meals?: None Help from another person taking care of personal grooming?: A Lot Help from another person toileting, which includes using toliet, bedpan, or urinal?: A Lot Help from another person bathing (including washing, rinsing, drying)?: A Lot Help from another person to put on and taking off regular upper body clothing?: A Lot Help from another person to put on and taking off regular lower body clothing?: A Lot 6 Click Score: 14   End of Session Equipment Utilized During Treatment: Gait belt;Other (comment) (sling) Nurse Communication: Mobility status  Activity Tolerance: Patient tolerated treatment well Patient left: in chair;with call bell/phone within reach;with family/visitor present  OT Visit Diagnosis: Unsteadiness on feet (R26.81);Other abnormalities of gait and mobility (R26.89);Muscle weakness (generalized) (M62.81)                Time: 2353-6144 OT Time Calculation (min): 39 min Charges:  OT General Charges $OT Visit: 1 Visit OT Evaluation $OT Eval Moderate Complexity: 1 Mod OT Treatments $Self Care/Home Management : 8-22 mins G-Codes:     Marcy Siren, OT Pager [phone] 08/11/2017   Danielle Benitez 08/11/2017, 5:30 PM

## 2017-08-11 NOTE — Consult Note (Signed)
HPI: Danielle Benitez is a 83 y.o. female with medical history significant for insulin-dependent diabetes mellitus, hypertension, history of CVA, p atrial fibrillation on Eliquis, chronic diastolic CHF, PMP and chronic kidney disease stage IV followed by her PCP.  Baseline creat is about 2.47m/dl. She had a fall with a left wrist fracture on 10/28.  Admission creat was 3.730mdl.   On 9/29 after admission for UTI, torsemide dose was 20 mg/d, increased to 80 mg/d 2 days later.  The daughter reports BPs lower than before over than past year.  Apparently Afib recurred per PM interrogation on 07/29/17.  No ACE-I or ARB on admission.  BP was soft on admission to ED 104/60 and 116/72.  Pt has received some fluids.  Renal [place].9cm bilat kidneys with increased echogenicity, mild ascites and small left pl effusion. CXR sm l eff/atx.  Diuretics are on hold and fluids ordered by cardiology.  She does c/o no appetite and nausea and vomiting.   Past Medical History:  Diagnosis Date  . Abnormality of gait   . Anemia, unspecified   . Anticoagulated- Eliquis 07/31/2015  . Anxiety state, unspecified   . Aortic stenosis    a. Mild by echo 08/2013, not seen on repeat echo 02/2017.  . Marland Kitchenifascicular block    afib  . Carotid artery disease (HCC)    a. Carotid dopplers (9/2/94with 4076-54%ICA stenosis (of note, prior ABI normal).  . Chronic diastolic CHF (congestive heart failure) ([place]   a. Dx 07/2015 - EF 35-40%, unclear etiology ? r/t afib or viral etiology. b. EF normal in 02/2017.  . CKD (chronic kidney disease), stage III (HCG[place]  . Depressive disorder, not elsewhere classified   . DM (diabetes mellitus), type 2, uncontrolled, with renal complications (HCLane3/6/50/3546. Eczema 11/22/2013  . Esophageal dysmotility 08/05/2015  . Essential hypertension   . Female stress incontinence 05/06/2015  . Full dentures   . GERD (gastroesophageal reflux disease)   . Gout   . HOH (hard of hearing)   . Internal hemorrhoids  without mention of complication   . Iron deficiency   . Lumbago   . Memory loss   . Mild CAD    a. Cath 2013: 20-30% prox RCA.  . Marland Kitchenyocardial infarction (HCMinneapolis10/2016  . Nonspecific abnormal results of liver function study   . Obesity, unspecified   . Orthostatic hypotension   . Osteoarthritis   . Other and unspecified hyperlipidemia   . PAF (paroxysmal atrial fibrillation) (HCFort Morgan09/30/2016   a. prior DCCVs, amiodarone, anticoagulation.  . Pain in joint, shoulder region 11/22/2013   left   . Personal history of fall   . Presence of permanent cardiac pacemaker   . Pruritus 11/22/2013  . S/P cardiac pacemaker procedure 01/11/2012   a. for symptomatic bradycardia. Followed by Dr. TaLovena LeDevice changed out 2017 due to ppm infection.  . Stroke ([facility]   a. evidence of prior strokes on MRI in 2018.  . Marland KitchenTI (urinary tract infection) 04/2017  . Wears glasses    Past Surgical History:  Procedure Laterality Date  . CARDIAC CATHETERIZATION  12/03/2003   Dr LiRex Kras. CARDIOVERSION N/A 07/25/2015   Procedure: CARDIOVERSION;  Surgeon: PaFay RecordsMD;  [facility];  Service: Cardiovascular;  Laterality: N/A;  . CARDIOVERSION N/A 08/06/2015   Procedure: CARDIOVERSION;  Surgeon: DaLarey DresserMD;  [place] Service: Cardiovascular;  Laterality: N/A;  . CATARACT EXTRACTION W/ INTRAOCULAR LENS  IMPLANT, BILATERAL Bilateral 03/2015-04/2015  . COLONOSCOPY     ?????  . EP IMPLANTABLE DEVICE N/A 04/15/2016   Procedure: Pacemaker Implant;  Surgeon: Evans Lance, MD;  Location: [place] CV LAB;  Service: Cardiovascular;  Laterality: N/A;  . ESOPHAGOGASTRODUODENOSCOPY N/A 08/08/2015   Procedure: ESOPHAGOGASTRODUODENOSCOPY (EGD);  Surgeon: Gatha Mayer, MD;  Location[facility] ENDOSCOPY;  Service: Endoscopy;  Laterality: N/A;  . EXTREMITY CYST EXCISION     finger  . EYE SURGERY Bilateral    catartact  . INSERT / REPLACE / REMOVE PACEMAKER    . LEFT HEART CATHETERIZATION WITH  CORONARY ANGIOGRAM N/A 01/10/2012   Procedure: LEFT HEART CATHETERIZATION WITH CORONARY ANGIOGRAM;  Surgeon: Pixie Casino, MD;  Location: [facility] CATH LAB;  Service: Cardiovascular;  Laterality: N/A;  . ORIF WRIST FRACTURE Right 04/09/2014   Procedure: OPEN REDUCTION INTERNAL FIXATION (ORIF) RIGHT DISPLACED  DISTAL RADIUS  FRACTURE;  Surgeon: Roseanne Kaufman, MD;  Location: [place];  Service: Orthopedics;  Laterality: Right;  . PACEMAKER LEAD REMOVAL  04/12/2016   TEMPORARY PACEMAKER INSERTION  . PACEMAKER LEAD REMOVAL  04/15/2016   Procedure: Pacemaker Lead Removal;  Surgeon: Evans Lance, MD;  Location: [place] CV LAB;  Service: Cardiovascular;;  . PACEMAKER LEAD REMOVAL N/A 04/12/2016   Procedure: PACEMAKER LEAD REMOVAL;  Surgeon: Evans Lance, MD;  Location: [place];  Service: Cardiovascular;  Laterality: N/A;  . PERMANENT PACEMAKER INSERTION Left 01/11/2012   Procedure: PERMANENT PACEMAKER INSERTION;  Surgeon: Sanda Klein, MD;  Location: [place] CATH LAB;  Service: Cardiovascular;  Laterality: Left;  . TEE WITHOUT CARDIOVERSION N/A 07/25/2015   Procedure: TRANSESOPHAGEAL ECHOCARDIOGRAM (TEE);  Surgeon: Fay Records, MD;  Location[facility] END[facility]COPY;  Service: Cardiovascular;  Laterality: N/A;   Social History:  reports that she has never smoked. She has never used smokeless tobacco. She reports that she does not drink alcohol or use drugs. Allergies:  Allergies  Allergen Reactions  . Beta Adrenergic Blockers Other (See Comments)    Couldn't speak or hear Metoprolol seems ok  . Jardiance [Empagliflozin] Other (See Comments)    Rash, kidney issues  . Lexapro [Escitalopram Oxalate] Other (See Comments)    Lack of effect.   Marland Kitchen Spironolactone Other (See Comments)    Hyperkalemia  . Zoloft [Sertraline Hcl] Other (See Comments)    Lack of effect.    Family History  Problem Relation Age of Onset  . Breast cancer Mother   . Cancer Mother   . Diabetes Daughter   . Hypertension  Daughter   . Cancer Brother     Medications:  Prior to Admission:  Prescriptions Prior to Admission  Medication Sig Dispense Refill Last Dose  . albuterol (PROVENTIL HFA;VENTOLIN HFA) 108 (90 Base) MCG/ACT inhaler Inhale 2 puffs into the lungs every 4 (four) hours as needed for wheezing or shortness of breath (cough, shortness of breath or wheezing.). 1 Inhaler 1 unk  . allopurinol (ZYLOPRIM) 100 MG tablet Take 2 tablets (200 mg total) by mouth daily. 60 tablet 0 08/04/2017 at Unknown time  . amiodarone (PACERONE) 200 MG tablet TAKE 200MG BY MOUTH DAILY   08/05/2017 at Unknown time  . atorvastatin (LIPITOR) 20 MG tablet Take 1 tablet (20 mg total) by mouth daily at 6 PM. 30 tablet 0 08/06/2017  . Blood Glucose Monitoring Suppl (ONE TOUCH ULTRA 2) w/Device KIT Check blood sugar twice a day and as directed Dx E11.21 1 each 0 unk  . buPROPion (WELLBUTRIN) 75 MG tablet Take  1 tablet (75 mg total) by mouth 2 (two) times daily. 60 tablet 1 07/29/2017 at am  . doxycycline (VIBRA-TABS) 100 MG tablet Take 1 tablet (100 mg total) by mouth 2 (two) times daily. (Patient taking differently: Take 100 mg by mouth every morning. ) 20 tablet 0 07/22/2017 at Unknown time  . ELIQUIS 2.5 MG TABS tablet TAKE 1 TABLET BY MOUTH TWICE DAILY 180 tablet 3 07/30/2017 at 1030  . fluticasone (FLONASE) 50 MCG/ACT nasal spray Place 1 spray into both nostrils daily as needed for allergies or rhinitis.   unk  . glimepiride (AMARYL) 1 MG tablet TAKE 1MG BY MOUTH EVERY MORNING WITH BREAKFAST 90 tablet 1 07/20/2017 at Unknown time  . glucose blood (ONE TOUCH ULTRA TEST) test strip CHECK BLOOD SUGAR TWICE A DAY AND AS DIRECTED;DX E11.21 100 each 5 unk  . Insulin Glargine (LANTUS SOLOSTAR) 100 UNIT/ML Solostar Pen Inject 20 Units into the skin at bedtime. (Patient taking differently: Inject 18 Units into the skin at bedtime. )   08/06/2017  . Insulin Pen Needle (PEN NEEDLES) 30G X 5 MM MISC Inject 1 Dose as directed daily. Use with  insulin pen 100 each 3 unk  . loratadine (CLARITIN CHILDRENS) 5 MG chewable tablet Chew 1 tablet (5 mg total) by mouth every evening.   08/06/2017  . metoprolol succinate (TOPROL-XL) 25 MG 24 hr tablet Take 1 tablet (25 mg total) by mouth 2 (two) times daily. 60 tablet 0 07/11/2017 at 1030  . Multiple Vitamins-Minerals (ONE-A-DAY 50 PLUS PO) Take 1 capsule by mouth daily.   08/05/2017 at Unknown time  . pantoprazole (PROTONIX) 40 MG tablet TAKE 40MG BY MOUTH DAILY   07/16/2017 at Unknown time  . potassium chloride SA (K-DUR,KLOR-CON) 20 MEQ tablet Take 20 mEq by mouth daily.   08/04/2017 at Unknown time  . senna-docusate (SENOKOT-S) 8.6-50 MG tablet Take 0.5 tablets by mouth daily.   07/16/2017 at Unknown time  . simethicone (GAS RELIEF) 80 MG chewable tablet CHEW 80 MG BY MOUTH EVERY 6 HOURS AS NEEDED FOR GAS 30 tablet 5 unk  . torsemide (DEMADEX) 20 MG tablet Take 4 tablets (80 mg total) by mouth daily.   08/06/2017 at Unknown time  . traZODone (DESYREL) 50 MG tablet Take 0.5-1 tablets (25-50 mg total) by mouth at bedtime as needed for sleep. Don't take >24m in a day. 30 tablet 3 07/25/2017 at Unknown time   Scheduled: . allopurinol  200 mg Oral Daily  . amiodarone  200 mg Oral Daily  . apixaban  2.5 mg Oral BID  . atorvastatin  20 mg Oral q1800  . buPROPion  75 mg Oral BID  . insulin aspart  0-9 Units Subcutaneous Q4H  . insulin glargine  20 Units Subcutaneous QHS  . loratadine  5 mg Oral QPM  . [START ON 08/12/2017] metoprolol succinate  25 mg Oral Daily  . pantoprazole  40 mg Oral Daily  . polyethylene glycol  17 g Oral Daily  . senna-docusate  0.5 tablet Oral Daily  . sodium chloride flush  3 mL Intravenous Q12H  . sodium chloride flush  3 mL Intravenous Q12H    ROS: as per HPI, some diff with swallowing but UGI shows aspiration Blood pressure (!) 114/92, pulse 69, temperature 97.6 F (36.4 C), temperature source Oral, resp. rate 18, height 5' 2" (1.575 m), weight 80.8 kg (178 lb  3.2 oz), SpO2 100 %.  General appearance: alert, cooperative and no oxygen Head: Normocephalic, without obvious abnormality, atraumatic  Eyes: negative Ears: normal TM's and external ear canals both ears Nose: Nares normal. Septum midline. Mucosa normal. No drainage or sinus tenderness. Throat: edentulous Resp: clear to auscultation bilaterally Chest wall: no tenderness Cardio: regular rate and rhythm, S1, S2 normal, no murmur, click, rub or gallop GI: soft, non-tender; bowel sounds normal; no masses,  no organomegaly Extremities: extremities normal, atraumatic, no cyanosis or edema Skin: multiple areas of ecchymosis Neurologic: Grossly normal Results for orders placed or performed during the hospital encounter of 07/12/2017 (from the past 48 hour(s))  Glucose, capillary     Status: Abnormal   Collection Time: 08/09/17  5:30 PM  Result Value Ref Range   Glucose-Capillary 118 (H) 65 - 99 mg/dL   Comment 1 Notify RN    Comment 2 Document in Chart   Lactic acid, plasma     Status: Abnormal   Collection Time: 08/09/17  6:46 PM  Result Value Ref Range   Lactic Acid, Venous 2.1 (HH) 0.5 - 1.9 mmol/L    Comment: CRITICAL RESULT CALLED TO, READ BACK BY AND VERIFIED WITH: Lawernce Pitts 1610 08/09/17 D BRADLEY   Sodium, urine, random     Status: None   Collection Time: 08/10/17  2:43 AM  Result Value Ref Range   Sodium, Ur <10 mmol/L  Creatinine, urine, random     Status: None   Collection Time: 08/10/17  2:43 AM  Result Value Ref Range   Creatinine, Urine 124.33 mg/dL  Glucose, capillary     Status: Abnormal   Collection Time: 08/10/17  4:27 AM  Result Value Ref Range   Glucose-Capillary 121 (H) 65 - 99 mg/dL  Basic metabolic panel     Status: Abnormal   Collection Time: 08/10/17  5:26 AM  Result Value Ref Range   Sodium 137 135 - 145 mmol/L   Potassium 4.1 3.5 - 5.1 mmol/L   Chloride 102 101 - 111 mmol/L   CO2 23 22 - 32 mmol/L   Glucose, Bld 105 (H) 65 - 99 mg/dL   BUN 67 (H) 6 -  20 mg/dL   Creatinine, Ser 3.67 (H) 0.44 - 1.00 mg/dL   Calcium 8.3 (L) 8.9 - 10.3 mg/dL   GFR calc non Af Amer 11 (L) >60 mL/min   GFR calc Af Amer 12 (L) >60 mL/min    Comment: (NOTE) The eGFR has been calculated using the CKD EPI equation. This calculation has not been validated in all clinical situations. eGFR's persistently <60 mL/min signify possible Chronic Kidney Disease.    Anion gap 12 5 - 15  Glucose, capillary     Status: Abnormal   Collection Time: 08/10/17  8:49 AM  Result Value Ref Range   Glucose-Capillary 59 (L) 65 - 99 mg/dL  CBC     Status: Abnormal   Collection Time: 08/10/17 11:25 AM  Result Value Ref Range   WBC 13.9 (H) 4.0 - 10.5 K/uL   RBC 3.20 (L) 3.87 - 5.11 MIL/uL   Hemoglobin 9.8 (L) 12.0 - 15.0 g/dL   HCT 30.3 (L) 36.0 - 46.0 %   MCV 94.7 78.0 - 100.0 fL   MCH 30.6 26.0 - 34.0 pg   MCHC 32.3 30.0 - 36.0 g/dL   RDW 21.7 (H) 11.5 - 15.5 %   Platelets 67 (L) 150 - 400 K/uL    Comment: CONSISTENT WITH PREVIOUS RESULT REPEATED TO VERIFY   Glucose, capillary     Status: Abnormal   Collection Time: 08/10/17 12:32 PM  Result Value  Ref Range   Glucose-Capillary 47 (L) 65 - 99 mg/dL  Glucose, capillary     Status: Abnormal   Collection Time: 08/10/17  1:18 PM  Result Value Ref Range   Glucose-Capillary 55 (L) 65 - 99 mg/dL  Glucose, capillary     Status: Abnormal   Collection Time: 08/10/17  2:55 PM  Result Value Ref Range   Glucose-Capillary 107 (H) 65 - 99 mg/dL  Glucose, capillary     Status: Abnormal   Collection Time: 08/10/17  5:39 PM  Result Value Ref Range   Glucose-Capillary 105 (H) 65 - 99 mg/dL  Basic metabolic panel     Status: Abnormal   Collection Time: 08/10/17  6:52 PM  Result Value Ref Range   Sodium 136 135 - 145 mmol/L   Potassium 4.2 3.5 - 5.1 mmol/L   Chloride 102 101 - 111 mmol/L   CO2 21 (L) 22 - 32 mmol/L   Glucose, Bld 112 (H) 65 - 99 mg/dL   BUN 70 (H) 6 - 20 mg/dL   Creatinine, Ser 3.94 (H) 0.44 - 1.00 mg/dL    Calcium 8.4 (L) 8.9 - 10.3 mg/dL   GFR calc non Af Amer 10 (L) >60 mL/min   GFR calc Af Amer 11 (L) >60 mL/min    Comment: (NOTE) The eGFR has been calculated using the CKD EPI equation. This calculation has not been validated in all clinical situations. eGFR's persistently <60 mL/min signify possible Chronic Kidney Disease.    Anion gap 13 5 - 15  Glucose, capillary     Status: Abnormal   Collection Time: 08/10/17  8:46 PM  Result Value Ref Range   Glucose-Capillary 107 (H) 65 - 99 mg/dL  Glucose, capillary     Status: None   Collection Time: 08/11/17  2:23 AM  Result Value Ref Range   Glucose-Capillary 95 65 - 99 mg/dL  Glucose, capillary     Status: None   Collection Time: 08/11/17  4:27 AM  Result Value Ref Range   Glucose-Capillary 87 65 - 99 mg/dL  Basic metabolic panel     Status: Abnormal   Collection Time: 08/11/17  6:03 AM  Result Value Ref Range   Sodium 138 135 - 145 mmol/L   Potassium 4.6 3.5 - 5.1 mmol/L   Chloride 105 101 - 111 mmol/L   CO2 16 (L) 22 - 32 mmol/L   Glucose, Bld 81 65 - 99 mg/dL   BUN 75 (H) 6 - 20 mg/dL   Creatinine, Ser 3.99 (H) 0.44 - 1.00 mg/dL   Calcium 8.1 (L) 8.9 - 10.3 mg/dL   GFR calc non Af Amer 10 (L) >60 mL/min   GFR calc Af Amer 11 (L) >60 mL/min    Comment: (NOTE) The eGFR has been calculated using the CKD EPI equation. This calculation has not been validated in all clinical situations. eGFR's persistently <60 mL/min signify possible Chronic Kidney Disease.    Anion gap 17 (H) 5 - 15  Glucose, capillary     Status: None   Collection Time: 08/11/17  7:47 AM  Result Value Ref Range   Glucose-Capillary 74 65 - 99 mg/dL  Glucose, capillary     Status: Abnormal   Collection Time: 08/11/17 12:30 PM  Result Value Ref Range   Glucose-Capillary 56 (L) 65 - 99 mg/dL  Glucose, capillary     Status: Abnormal   Collection Time: 08/11/17  2:45 PM  Result Value Ref Range   Glucose-Capillary 62 (L) 65 -  99 mg/dL   Dg  Esophagus  Result Date: 08/11/2017 CLINICAL DATA:  3 week history of nausea and vomiting.  Weight loss. EXAM: ESOPHOGRAM/BARIUM SWALLOW TECHNIQUE: Single contrast examination was performed using  thin barium. FLUOROSCOPY TIME:  Fluoroscopy Time:  1.4 minutes Radiation Exposure Index (if provided by the fluoroscopic device): 12.8 mGy Number of Acquired Spot Images: 0 COMPARISON:  None. FINDINGS: Limited examination secondary to decreased patient mobility. Entire examination was performed in the semi upright position. There was normal pharyngeal anatomy and motility. Contrast flowed freely through the esophagus without evidence of stricture or mass. There was normal esophageal mucosa without evidence of irregularity or ulceration. Diffuse tertiary contractions throughout the esophagus consistent with spasm. No evidence of reflux. No definite hiatal hernia was demonstrated. IMPRESSION: 1. Diffuse esophageal spasm. 2. No esophageal stricture. Electronically Signed   By: Kathreen Devoid   On: 08/11/2017 14:22   US Renal  Result Date: 08/11/2017 CLINICAL DATA:  Acute renal injury. EXAM: RENAL / URINARY TRACT ULTRASOUND COMPLETE COMPARISON:  Ultrasound 07/21/2015 . FINDINGS: Right Kidney: Length: 7.9 cm. Right kidney is small and echogenic consistent with chronic medical renal disease and atrophy. No mass. No hydronephrosis . Left Kidney: Length: 7.9 cm. Left kidney is small and echogenic consistent with chronic medical renal disease and atrophy. No mass. No hydronephrosis. Bladder: Appears normal for degree of bladder distention. Mild ascites. Left pleural effusion noted. IMPRESSION: 1. Kidneys are small and echogenic consistent chronic medical renal disease and atrophy. 2.  No hydronephrosis.  No bladder distention. 3.  Mild ascites.  Left pleural effusion noted. Electronically Signed   By: Marcello Moores  Register   On: 08/11/2017 11:57    Assessment:  1  Oliguric AKI, hemodynamically mediated (low BP, A fib, ?mild AS)  -anorexia and nausea certainly worrisome re: early uremia 2  CKD 4, prob nephrosclerosis 3  Hypotension, poss due to hypovolemia-check cortisol  Plan: 1 AM Cortisol level 2 Optimize BP (? DC metoprolol) 3 Supportive therapy at this time 4 Judicious volume replacement 5 Dialysis intervention, if entertained, will be complicated by the hypotension  Estanislado Emms 08/11/2017, 3:27 PM

## 2017-08-11 NOTE — Consult Note (Addendum)
Advanced Heart Failure Team Consult Note   Primary Physician: Dr. Damita Dunnings Primary Cardiologist: Dr. Aundra Dubin  Reason for Consultation: h/o CHF, AKI  HPI:    Danielle Benitez is seen today for evaluation of CHF/AKI at the request of Dr. Lonny Prude.   83 yo with history of HTN, orthostatic hypotension, symptomatic bradycardia with Medtronic PPM, paroxysmal atrial fibrillation, chronic diastolic CHF, CKD stage III, and iron deficiency anemia. Around the beginning of 9/16, she began to develop exertional dyspnea. She was seen in the cardiology office on 07/25/15. She was in atrial fibrillation with RVR and echo showed EF 35-40% (had been 50-55% in past). PPM interrogation showed that atrial fibrillation had begun 07/11/15, so exertional dyspnea had preceded this by about 4 weeks. She was admitted from 10/7-10/17/16. She was anemic with hemoglobin 8.7. She was heme negative but Fe deficient. EGD was not done. She had TEE-guided DCCV to NSR. She also had AKI with creatinine up to 3.41, but it trended back down. She was sent home in NSR. She was readmitted 10/19-10/23/16 with recurrent atrial fibrillation and acute on chronic systolic CHF. She was diuresed about 10 lbs. It was decided to rate control her rather than repeat cardioversion, and she was sent home. she was only at home for about a day and developed increased dyspnea so came back to the ER and was re-admitted.  She was diuresed with IV Lasix and started on amiodarone. She underwent DCCV on 08/06/15 but atrial fibrillation recurred on 10/29.  More amiodarone was loaded, and she went back into NSR.   Lexiscan Cardiolite in 11/16 showed basal inferior/inferolatearl fixed defect consistent with infarction but no ischemia.    Repeat echo was done 1/17.  This showed EF 45% with normal RV size and mildly decreased systolic function.   She was admitted in 7/17 with suspected infection of her device.  She had her PPM extracted and got a  temporary-permanent device with later Medtronic PPM replacement. Last echo in 7/18 showed EF 60-65% with mild LVH, mildly dilated RV.  She was admitted in 7/18 and again in 8/18 with complex UTIs with mental status changes.  She was given IV fluid during these admissions.   She had a CardioMems device placed.    Prior to this admission, she had felt weak x 2 weeks.  Poor po intake.  She has been nauseated with meals and vomits after eating.  No chest pain.  +LH with standing and daughter says BP has been low.  She fell at home and came to the ER on 10/29. There was concern for PNA on CXR and she was noted to have fractured her left wrist.  She had AKI with creatinine up to 3.76 from 2.36.  Last PADP reading from Cardiomems was 16 mmHg, which is not significantly elevated (from 10/27).   She was admitted and torsemide was held.  Creatinine has stayed up, 3.99 today.  She has been gently hydrated.  Still nauseated with meals.  Started on vancomycin/cefepime for suspected PNA.   Review of Systems: All systems reviewed and negative except as per HPI.   Home Medications Prior to Admission medications   Medication Sig Start Date End Date Taking? Authorizing Provider  albuterol (PROVENTIL HFA;VENTOLIN HFA) 108 (90 Base) MCG/ACT inhaler Inhale 2 puffs into the lungs every 4 (four) hours as needed for wheezing or shortness of breath (cough, shortness of breath or wheezing.). 10/15/16  Yes Elby Beck, FNP  allopurinol (ZYLOPRIM) 100 MG tablet Take  2 tablets (200 mg total) by mouth daily. 07/09/17  Yes Aline August, MD  amiodarone (PACERONE) 200 MG tablet TAKE 200MG BY MOUTH DAILY 05/04/17  Yes Tonia Ghent, MD  atorvastatin (LIPITOR) 20 MG tablet Take 1 tablet (20 mg total) by mouth daily at 6 PM. 04/06/17  Yes Velvet Bathe, MD  Blood Glucose Monitoring Suppl (ONE TOUCH ULTRA 2) w/Device KIT Check blood sugar twice a day and as directed Dx E11.21 05/24/17  Yes Tonia Ghent, MD  buPROPion  (WELLBUTRIN) 75 MG tablet Take 1 tablet (75 mg total) by mouth 2 (two) times daily. 07/01/17  Yes Tonia Ghent, MD  doxycycline (VIBRA-TABS) 100 MG tablet Take 1 tablet (100 mg total) by mouth 2 (two) times daily. Patient taking differently: Take 100 mg by mouth every morning.  07/26/17  Yes Tonia Ghent, MD  ELIQUIS 2.5 MG TABS tablet TAKE 1 TABLET BY MOUTH TWICE DAILY 07/18/17  Yes Bensimhon, Shaune Pascal, MD  fluticasone (FLONASE) 50 MCG/ACT nasal spray Place 1 spray into both nostrils daily as needed for allergies or rhinitis.   Yes [provider]  glimepiride (AMARYL) 1 MG tablet TAKE 1MG BY MOUTH EVERY MORNING WITH BREAKFAST 05/04/17  Yes Tonia Ghent, MD  glucose blood (ONE TOUCH ULTRA TEST) test strip CHECK BLOOD SUGAR TWICE A DAY AND AS DIRECTED;DX E11.21 05/24/17  Yes Tonia Ghent, MD  Insulin Glargine (LANTUS SOLOSTAR) 100 UNIT/ML Solostar Pen Inject 20 Units into the skin at bedtime. Patient taking differently: Inject 18 Units into the skin at bedtime.  07/26/17  Yes Tonia Ghent, MD  Insulin Pen Needle (PEN NEEDLES) 30G X 5 MM MISC Inject 1 Dose as directed daily. Use with insulin pen 01/27/17  Yes Tonia Ghent, MD  loratadine (CLARITIN CHILDRENS) 5 MG chewable tablet Chew 1 tablet (5 mg total) by mouth every evening. 07/26/17  Yes Tonia Ghent, MD  metoprolol succinate (TOPROL-XL) 25 MG 24 hr tablet Take 1 tablet (25 mg total) by mouth 2 (two) times daily. 04/05/17  Yes Velvet Bathe, MD  Multiple Vitamins-Minerals (ONE-A-DAY 50 PLUS PO) Take 1 capsule by mouth daily.   Yes [provider]  pantoprazole (PROTONIX) 40 MG tablet TAKE 40MG BY MOUTH DAILY 05/04/17  Yes Tonia Ghent, MD  potassium chloride SA (K-DUR,KLOR-CON) 20 MEQ tablet Take 20 mEq by mouth daily.   Yes [provider]  senna-docusate (SENOKOT-S) 8.6-50 MG tablet Take 0.5 tablets by mouth daily. 07/26/17  Yes Tonia Ghent, MD  simethicone (GAS RELIEF) 80 MG chewable  tablet CHEW 80 MG BY MOUTH EVERY 6 HOURS AS NEEDED FOR GAS 07/26/17  Yes Tonia Ghent, MD  torsemide (DEMADEX) 20 MG tablet Take 4 tablets (80 mg total) by mouth daily. 07/12/17  Yes Larey Dresser, MD  traZODone (DESYREL) 50 MG tablet Take 0.5-1 tablets (25-50 mg total) by mouth at bedtime as needed for sleep. Don't take >8m in a day. 01/21/16  Yes DTonia Ghent MD   Past Medical History: 1. HTN but also with history of orthostatic hypotension. 2. CKD stage III 3. Aortic stenosis: Mild 4. Symptomatic bradycardia s/p Medtronic PPM. - PPM infection 7/17 with device replacement.  5. Hyperlipidemia 6. Atrial fibrillation: Paroxysmal. TEE-guided DCCV in 10/16. She had DCCV again latera in 10/16 and was started on amiodarone.   - PFTs (11/16) with normal spirometry, severe diffusion defect c/w pulmonary vascular disease.  7. Type II diabetes with diabetic neuropathy.  8. Gout 9.  Fe-deficiency anemia 10. Chronic diastolic CHF: Echo 32/20 with EF 50-55%. Echo (10/16) with EF 35-40%, moderate LV dilation, diffuse hypokinesis, mild AS, moderate MR, PASP 48 mmHg.  Lexiscan Cardiolite (11/16) with EF 31%, basal inferior and inferolateral fixed defect possibly consistent with infarction, no ischemia; degree of LV hypokinesis was out of proportion to the perfusion defect.  Echo (1/17) with EF 45%, normal RV size with mildly decreased systolic function, aortic sclerosis without stenosis.  - Echo (8/17): EF 55-60%, mild LVH, normal RV size and systolic function.  - Echo (7/18): EF 60-65%, mildly dilated RV, PASP 36 mmHg.  - CardioMems sensor in place.  11. Esophageal dysmotility 12. Carotid stenosis: Carotid dopplers (2/54) with 27-06% LICA stenosis.  - carotid dopplers (7/18) with mild disease bilaterally.  13. ABIs (12/17) normal.  14. Recurrent UTIs  Past Surgical History: Past Surgical History:  Procedure Laterality Date  . CARDIAC CATHETERIZATION  12/03/2003   Dr Rex Kras  .  CARDIOVERSION N/A 07/25/2015   Procedure: CARDIOVERSION;  Surgeon: Fay Records, MD;  Location: [facility] ENDOSCOPY;  Service: Cardiovascular;  Laterality: N/A;  . CARDIOVERSION N/A 08/06/2015   Procedure: CARDIOVERSION;  Surgeon: Larey Dresser, MD;  Location: [place];  Service: Cardiovascular;  Laterality: N/A;  . CATARACT EXTRACTION W/ INTRAOCULAR LENS  IMPLANT, BILATERAL Bilateral 03/2015-04/2015  . COLONOSCOPY     ?????  . EP IMPLANTABLE DEVICE N/A 04/15/2016   Procedure: Pacemaker Implant;  Surgeon: Evans Lance, MD;  Location: [place] CV LAB;  Service: Cardiovascular;  Laterality: N/A;  . ESOPHAGOGASTRODUODENOSCOPY N/A 08/08/2015   Procedure: ESOPHAGOGASTRODUODENOSCOPY (EGD);  Surgeon: Gatha Mayer, MD;  Location: [facility] ENDOSCOPY;  Service: Endoscopy;  Laterality: N/A;  . EXTREMITY CYST EXCISION     finger  . EYE SURGERY Bilateral    catartact  . INSERT / REPLACE / REMOVE PACEMAKER    . LEFT HEART CATHETERIZATION WITH CORONARY ANGIOGRAM N/A 01/10/2012   Procedure: LEFT HEART CATHETERIZATION WITH CORONARY ANGIOGRAM;  Surgeon: Pixie Casino, MD;  Location: [facility] CATH LAB;  Service: Cardiovascular;  Laterality: N/A;  . ORIF WRIST FRACTURE Right 04/09/2014   Procedure: OPEN REDUCTION INTERNAL FIXATION (ORIF) RIGHT DISPLACED  DISTAL RADIUS  FRACTURE;  Surgeon: Roseanne Kaufman, MD;  Location: [place];  Service: Orthopedics;  Laterality: Right;  . PACEMAKER LEAD REMOVAL  04/12/2016   TEMPORARY PACEMAKER INSERTION  . PACEMAKER LEAD REMOVAL  04/15/2016   Procedure: Pacemaker Lead Removal;  Surgeon: Evans Lance, MD;  Location: [place] CV LAB;  Service: Cardiovascular;;  . PACEMAKER LEAD REMOVAL N/A 04/12/2016   Procedure: PACEMAKER LEAD REMOVAL;  Surgeon: Evans Lance, MD;  Location: [place];  Service: Cardiovascular;  Laterality: N/A;  . PERMANENT PACEMAKER INSERTION Left 01/11/2012   Procedure: PERMANENT PACEMAKER INSERTION;  Surgeon: Sanda Klein, MD;  Location: [place] CATH LAB;   Service: Cardiovascular;  Laterality: Left;  . TEE WITHOUT CARDIOVERSION N/A 07/25/2015   Procedure: TRANSESOPHAGEAL ECHOCARDIOGRAM (TEE);  Surgeon: Fay Records, MD;  Location: [facility] ENDOSCOPY;  Service: Cardiovascular;  Laterality: N/A;    Family History: Family History  Problem Relation Age of Onset  . Breast cancer Mother   . Cancer Mother   . Diabetes Daughter   . Hypertension Daughter   . Cancer Brother     Social History: Social History   Social History  . Marital status: Widowed    Spouse name: N/A  . Number of children: N/A  . Years of education: N/A   Social History Main Topics  .  Smoking status: Never Smoker  . Smokeless tobacco: Never Used  . Alcohol use No  . Drug use: No  . Sexual activity: No   Other Topics Concern  . None   Social History Narrative   Physically inactive. She says she hurts in her back and knees too much to do much walking.   9th grade   Worked in multiple mills   Living with daughter    Doesn't drive as of 00/8676    Allergies:  Allergies  Allergen Reactions  . Beta Adrenergic Blockers Other (See Comments)    Couldn't speak or hear Metoprolol seems ok  . Jardiance [Empagliflozin] Other (See Comments)    Rash, kidney issues  . Lexapro [Escitalopram Oxalate] Other (See Comments)    Lack of effect.   Marland Kitchen Spironolactone Other (See Comments)    Hyperkalemia  . Zoloft [Sertraline Hcl] Other (See Comments)    Lack of effect.     Objective:    Vital Signs:   Temp:  [97.6 F (36.4 C)-97.7 F (36.5 C)] 97.6 F (36.4 C) (11/01 0933) Pulse Rate:  [67-70] 69 (11/01 0942) Resp:  [16-18] 18 (11/01 0933) BP: (111-137)/(56-92) 114/92 (11/01 0933) SpO2:  [92 %-100 %] 100 % (11/01 0933) Last BM Date: 07/24/2017  Weight change: Filed Weights   08/05/2017 2110  Weight: 178 lb 3.2 oz (80.8 kg)    Intake/Output:   Intake/Output Summary (Last 24 hours) at 08/11/17 0959 Last data filed at 08/10/17 1900  Gross per 24 hour  Intake               480 ml  Output              200 ml  Net              280 ml      Physical Exam    General:  Chronically ill-appearing HEENT: normal Neck: supple. JVP not elevated. Carotids 2+ bilat; no bruits. No lymphadenopathy or thyromegaly appreciated. Cor: PMI nondisplaced. Regular rate & rhythm. No rubs, gallops or murmurs. Lungs: Decreased BS at bases bilaterally.  Abdomen: soft, nontender, nondistended. No hepatosplenomegaly. No bruits or masses. Good bowel sounds. Extremities: no cyanosis, clubbing, rash, edema.  Left wrist in splint.  Neuro: alert & orientedx3, cranial nerves grossly intact. moves all 4 extremities w/o difficulty. Affect pleasant   Telemetry   A-paced, v-sensed (personally reviewed)  EKG    A-paced, RBBB, LAFB (personally reviewed)  Labs   Basic Metabolic Panel:  Recent Labs Lab 08/08/17 0043 08/08/17 0309 08/10/17 0526 08/10/17 1852 08/11/17 0603  NA 138 136 137 136 138  K 3.9 4.1 4.1 4.2 4.6  CL 99* 101 102 102 105  CO2 _0 21* 16*  GLUCOSE 243* 284* 105* 112* 81  BUN 60* 60* 67* 70* 75*  CREATININE 3.76* 3.51* 3.67* 3.94* 3.99*  CALCIUM 8.4* 7.8* 8.3* 8.4* 8.1*    Liver Function Tests:  Recent Labs Lab 08/08/17 0043 08/08/17 0309  AST 88* 81*  ALT 34 29  ALKPHOS 191* 158*  BILITOT 3.0* 2.5*  PROT 6.5 5.3*  ALBUMIN 2.2* 1.9*   No results for input(s): LIPASE, AMYLASE in the last 168 hours. No results for input(s): AMMONIA in the last 168 hours.  CBC:  Recent Labs Lab 08/09/2017 2231 08/08/17 0309 08/10/17 1125  WBC 12.4* 9.2 13.9*  NEUTROABS 8.5* 6.2  --   HGB 9.1* 7.6* 9.8*  HCT 28.6* 24.1* 30.3*  MCV 95.7 94.1 94.7  PLT  135* 67* 67*    Cardiac Enzymes: No results for input(s): CKTOTAL, CKMB, CKMBINDEX, TROPONINI in the last 168 hours.  BNP: BNP (last 3 results)  Recent Labs  03/31/17 2000 06/03/17 1338 06/14/17 1545  BNP 398.4* 303.4* 236.7*    ProBNP (last 3 results)  Recent Labs  09/20/16 1458  03/16/17 1441 04/11/17 1345  PROBNP 140.0* 287.0* 215.0*     CBG:  Recent Labs Lab 08/10/17 1739 08/10/17 2046 08/11/17 0223 08/11/17 0427 08/11/17 0747  GLUCAP 105* 107* 95 87 74    Coagulation Studies: No results for input(s): LABPROT, INR in the last 72 hours.   Imaging    No results found.   Medications:     Current Medications: . allopurinol  200 mg Oral Daily  . amiodarone  200 mg Oral Daily  . apixaban  2.5 mg Oral BID  . atorvastatin  20 mg Oral q1800  . buPROPion  75 mg Oral BID  . insulin aspart  0-9 Units Subcutaneous Q4H  . insulin glargine  20 Units Subcutaneous QHS  . loratadine  5 mg Oral QPM  . metoprolol succinate  25 mg Oral BID  . pantoprazole  40 mg Oral Daily  . senna-docusate  0.5 tablet Oral Daily  . sodium chloride flush  3 mL Intravenous Q12H  . sodium chloride flush  3 mL Intravenous Q12H     Infusions: . sodium chloride    . ceFEPime (MAXIPIME) IV 1 g (08/11/17 0221)  . vancomycin Stopped (08/10/17 0244)       Patient Profile   83 yo with history of HTN, orthostatic hypotension, symptomatic bradycardia with Medtronic PPM, paroxysmal atrial fibrillation, chronic diastolic CHF, CKD stage IV, and iron deficiency anemia was admitted after a fall with fractured wrist and possible PNA, also AKI on CKD stage IV.   Assessment/Plan   1. Fall: Mechanical, likely due to hypotension/lightheadedness.  Daughter says BP has been low at home.  SBP now in 110s in bed.  Possibly triggered by PNA, being treated.  Has wrist fracture that will be treated conservatively (splint).  2. Chronic diastolic CHF: Cardiolite in 11/16 showed a small area possibly of prior infarction but EF and wall motion abnormalities were out of proportion to the perfusion defect. Last echo in 7/18 showed EF up to 60-65%. Main complaint now is profound weakness.  Possible dehydration + PNA.  On exam, she does not appear volume overloaded and creatinine is up.  Last  Cardiomems reading showed stable fluid level.  - Continue to hold torsemide for now until creatinine starts to come down.  With decreased po intake, she probably will not need as high a dose of diuretic.     - Decrease Toprol XL to once daily with low BP.  She is in NSR.   3. Atrial fibrillation: Tolerates poorly. NSR today.   - Continue Eliquis2.5 mg bid.  - Toprol XL to be decreased to 25 mg daily.  - Continue amiodarone for maintenance of NSR.   4. AKI on CKD stage 3: Creatinine to 3.99 from around 2.3.  Possible dehydration due to poor po intake in setting of possible PNA and failure to thrive.  Likely will not need as high a dose of torsemide in the future.  5. Medtronic PPM for symptomatic bradycardia.   6. CAD: Possible old infarction on Cardiolite.  No aspirin given Eliquis use.  7. ID: Concern for possible PNA on CXR.  She is on broad spectrum abx.   I  suspect she will need a rehab stay after discharge.   Length of Stay: 3  Loralie Champagne, MD  08/11/2017, 9:59 AM  Advanced Heart Failure Team Pager [phone] (M-F; 7a - 4p)  Please contact [facility] for night-coverage after hours (4p -7a ) and weekends on [email or web address]

## 2017-08-11 DEATH — deceased

## 2017-08-12 ENCOUNTER — Inpatient Hospital Stay (HOSPITAL_COMMUNITY): Payer: Medicare Other

## 2017-08-12 DIAGNOSIS — R112 Nausea with vomiting, unspecified: Secondary | ICD-10-CM

## 2017-08-12 DIAGNOSIS — K5900 Constipation, unspecified: Secondary | ICD-10-CM

## 2017-08-12 DIAGNOSIS — D649 Anemia, unspecified: Secondary | ICD-10-CM

## 2017-08-12 DIAGNOSIS — R945 Abnormal results of liver function studies: Secondary | ICD-10-CM

## 2017-08-12 DIAGNOSIS — R1310 Dysphagia, unspecified: Secondary | ICD-10-CM

## 2017-08-12 DIAGNOSIS — N19 Unspecified kidney failure: Secondary | ICD-10-CM

## 2017-08-12 DIAGNOSIS — Z7901 Long term (current) use of anticoagulants: Secondary | ICD-10-CM

## 2017-08-12 DIAGNOSIS — D696 Thrombocytopenia, unspecified: Secondary | ICD-10-CM

## 2017-08-12 LAB — BASIC METABOLIC PANEL
Anion gap: 13 (ref 5–15)
BUN: 76 mg/dL — AB (ref 6–20)
CHLORIDE: 103 mmol/L (ref 101–111)
CO2: 20 mmol/L — AB (ref 22–32)
Calcium: 8.2 mg/dL — ABNORMAL LOW (ref 8.9–10.3)
Creatinine, Ser: 4.22 mg/dL — ABNORMAL HIGH (ref 0.44–1.00)
GFR calc Af Amer: 10 mL/min — ABNORMAL LOW (ref >60)
GFR calc non Af Amer: 9 mL/min — ABNORMAL LOW (ref >60)
Glucose, Bld: 99 mg/dL (ref 65–99)
POTASSIUM: 3.9 mmol/L (ref 3.5–5.1)
SODIUM: 136 mmol/L (ref 135–145)

## 2017-08-12 LAB — CBC
HEMATOCRIT: 27.6 % — AB (ref 36.0–46.0)
HEMOGLOBIN: 8.7 g/dL — AB (ref 12.0–15.0)
MCH: 29.9 pg (ref 26.0–34.0)
MCHC: 31.5 g/dL (ref 30.0–36.0)
MCV: 94.8 fL (ref 78.0–100.0)
Platelets: 52 10*3/uL — ABNORMAL LOW (ref 150–400)
RBC: 2.91 MIL/uL — ABNORMAL LOW (ref 3.87–5.11)
RDW: 21.4 % — ABNORMAL HIGH (ref 11.5–15.5)
WBC: 10.2 10*3/uL (ref 4.0–10.5)

## 2017-08-12 LAB — GLUCOSE, CAPILLARY
GLUCOSE-CAPILLARY: 115 mg/dL — AB (ref 65–99)
GLUCOSE-CAPILLARY: 135 mg/dL — AB (ref 65–99)
GLUCOSE-CAPILLARY: 39 mg/dL — AB (ref 65–99)
GLUCOSE-CAPILLARY: 59 mg/dL — AB (ref 65–99)
Glucose-Capillary: 103 mg/dL — ABNORMAL HIGH (ref 65–99)
Glucose-Capillary: 116 mg/dL — ABNORMAL HIGH (ref 65–99)
Glucose-Capillary: 58 mg/dL — ABNORMAL LOW (ref 65–99)
Glucose-Capillary: 73 mg/dL (ref 65–99)
Glucose-Capillary: 79 mg/dL (ref 65–99)
Glucose-Capillary: 98 mg/dL (ref 65–99)

## 2017-08-12 LAB — CORTISOL: CORTISOL PLASMA: 13.2 ug/dL

## 2017-08-12 MED ORDER — BISACODYL 10 MG RE SUPP
10.0000 mg | Freq: Once | RECTAL | Status: AC
  Administered 2017-08-12: 10 mg via RECTAL
  Filled 2017-08-12: qty 1

## 2017-08-12 MED ORDER — DEXTROSE 50 % IV SOLN
INTRAVENOUS | Status: AC
  Administered 2017-08-12: 25 mL via INTRAVENOUS
  Filled 2017-08-12: qty 50

## 2017-08-12 MED ORDER — DEXTROSE 50 % IV SOLN
25.0000 mL | Freq: Once | INTRAVENOUS | Status: AC
  Administered 2017-08-12: 25 mL via INTRAVENOUS

## 2017-08-12 MED ORDER — DEXTROSE-NACL 5-0.45 % IV SOLN
INTRAVENOUS | Status: DC
  Administered 2017-08-12: 17:00:00 via INTRAVENOUS

## 2017-08-12 MED ORDER — ONDANSETRON HCL 4 MG/2ML IJ SOLN
4.0000 mg | Freq: Three times a day (TID) | INTRAMUSCULAR | Status: DC
  Administered 2017-08-12 – 2017-08-15 (×8): 4 mg via INTRAVENOUS
  Filled 2017-08-12 (×8): qty 2

## 2017-08-12 NOTE — Care Management Important Message (Signed)
Important Message  Patient Details  Name: Danielle Benitez MRN: [id number] Date of Birth: 04-12-1932   Medicare Important Message Given:  Yes    Clarance Bollard Stefan Church 08/12/2017, 11:48 AM

## 2017-08-12 NOTE — Progress Notes (Signed)
PT Cancellation Note  Patient Details Name: Danielle Benitez MRN: [id number] DOB: 10-08-1932   Cancelled Treatment:    Reason Eval/Treat Not Completed: Medical issues which prohibited therapy; have attempted twice to see pt today, she reports N&V after meals and just finished getting sick.  Second attempt awaiting x-ray for KUB and RN reports needs to stay in bed.  Will attempt to see another day.   Elray Mcgregor 08/12/2017, 12:27 PM  Sheran Lawless, PT [phone] 08/12/2017

## 2017-08-12 NOTE — Progress Notes (Signed)
  [facility] Progress Note   Assessment/ Plan:   Assessment:  1  Oliguric AKI, hemodynamically mediated (low BP, A fib, ?mild AS): I believe she's uremic.  Discussed with pt and family that she's a poor candidate for dialysis.  I have discussed this with her and her family.  Pall care c/s. 2  CKD 4, prob nephrosclerosis 3  Hypotension, poss due to hypovolemia-check cortisol (normal)  Subjective:    Cr up, having n/v.  Discussed with pt and family.  She hasn't been eating or drinking for the past 2 weeks.  She is frail and is a poor candidate for dialysis.  UOP still poor   Objective:   BP 116/66 (BP Location: Left Leg)   Pulse 70   Temp 97.8 F (36.6 C) (Oral)   Resp 14   Ht 5\' 2"  (1.575 m)   Wt 80.8 kg (178 lb 3.2 oz)   SpO2 95%   BMI 32.59 kg/m   Physical Exam: GEN NAD, intermittently burping HEENT sclerae anicteric NECK no JVD PULM clear anteriorly CV irregular, II/VI systolic mrumur ABD hypoactive BS EXT no LE edema NEURO AAO x 3 SKIN: mult ecchymoses  Labs: BMET  Recent Labs Lab 08/08/17 0043 08/08/17 0309 08/10/17 0526 08/10/17 1852 08/11/17 0603 08/12/17 0352  NA 138 136 137 136 138 136  K 3.9 4.1 4.1 4.2 4.6 3.9  CL 99* 101 102 102 105 103  CO2 27 26 23  21* 16* 20*  GLUCOSE 243* 284* 105* 112* 81 99  BUN 60* 60* 67* 70* 75* 76*  CREATININE 3.76* 3.51* 3.67* 3.94* 3.99* 4.22*  CALCIUM 8.4* 7.8* 8.3* 8.4* 8.1* 8.2*   CBC  Recent Labs Lab 07/25/2017 2231 08/08/17 0309 08/10/17 1125 08/12/17 0352  WBC 12.4* 9.2 13.9* 10.2  NEUTROABS 8.5* 6.2  --   --   HGB 9.1* 7.6* 9.8* 8.7*  HCT 28.6* 24.1* 30.3* 27.6*  MCV 95.7 94.1 94.7 94.8  PLT 135* 67* 67* 52*    @IMGRELPRIORS @ Medications:    . allopurinol  200 mg Oral Daily  . amiodarone  200 mg Oral Daily  . apixaban  2.5 mg Oral BID  . atorvastatin  20 mg Oral q1800  . buPROPion  75 mg Oral BID  . insulin aspart  0-9 Units Subcutaneous Q4H  . insulin glargine  20 Units  Subcutaneous QHS  . loratadine  5 mg Oral QPM  . ondansetron  4 mg Intravenous Q8H  . pantoprazole  40 mg Oral Daily  . polyethylene glycol  17 g Oral Daily  . senna-docusate  0.5 tablet Oral Daily  . sodium chloride flush  3 mL Intravenous Q12H  . sodium chloride flush  3 mL Intravenous Q12H     Bufford Buttner, MD [facility] pgr [phone] 08/12/2017, 3:49 PM

## 2017-08-12 NOTE — Progress Notes (Signed)
  Hypoglycemic Event  CBG: 37  Treatment: D50 IV 25 mL  Symptoms: None  Follow-up CBG: Time:1254 CBG Result: 73  Possible Reasons for Event: Other: NPO  Comments/MD notified: Dr. Caleb Popp notified, no new orders.    Rolanda Lundborg

## 2017-08-12 NOTE — Consult Note (Signed)
[facility] Consult: 11:22 AM 08/12/2017  LOS: 4 days    Referring Provider: Dr Lonny Prude  Primary Care Physician:  Tonia Ghent, MD Primary Gastroenterologist:  Dr. Carlean Purl as inpt only.      Reason for Consultation:  Nausea and vomiting.     HPI: Danielle Benitez is a 83 y.o. female.  PMH bradycardia, s/p pacemaker.  CVA.  Orthostatic hypotension.  Atrial fib, failed previous DCCVs, latest on 07/24/17, chronic Eliquis.  CKD stage 4.  CHF, EF 60 to 65% (previously 30%) and grade 1 diastolic dysfunction by 03/5680 echo.  Iron deficiency anemia, chronic dating back to 2013.  IDDM.  Chronic dysphagia.  Thrombocytopenia dates to 2011  07/2015 Esophagram:  Pharyngeal imaging without aspiration or obstruction. The thoracic esophagus has normal distensibility and course with no evidence of mass or stricture. Small hiatal hernia. Esophageal motility could not be adequately assessed due to positioning, but primary propulsion wave was effective and there was no significant stasis. No gastroesophageal reflux. The 13 mm barium tablet successfully traversed the esophagus. EGD 07/2015 for dysphagia revealed tortuous esophagus consistent with dysmotility, small hiatal hernia.  Colonoscopy was never pursued for workup of unexplained iron deficiency anemia due to multiple comorbidities.  Hospital admissions x 5  02/2017 through 06/2017.  Problems including noncardiac chest pain.  Weakness.  Diastolic heart failure.  A. fib, failed DCCV.  Metabolic encephalopathy.  Recurrent UTIs.  AKI.  Probable CAP thrombocytopenia attributed to apixaban.    Admitted after sustaining a distal left radial and ulnar fracture following fall.  Ortho is managing this nonoperatively with splint.  Patient receiving antibiotics for possible HCAP versus pleural  effusion.   Renal service following her for progressive, oliguric, kidney disease. For about 3 weeks the patient has had nausea and non-bloody emesis.  This is gotten worse since her admission.  She is not taking significant amounts of p.o.  Prior to all of this she did have dysphagia which was managed with cutting her food up into small pieces and eating a softer diet.  Zofran slightly helpful.  She tends to be constipated.  She has not had a bowel movement for 5 days despite daily po Senokot and 2 days daily Miralax.  Prior to that the stools were not bloody or brown.  She feels a bit bloated and constipated. Use nonsteroidal medications.  She is never been a drinker.  No known history of liver or gallbladder issues.  Daily Protonix at home.   Labs reviewed.  Show elevation of LFTs with maximum T bili 191, alk phos 191, AST/ALT 88/34.Marland Kitchen LFT elevations date back to 10/2015 (T bili 2.3, alk phos 340, AST/ALT 116/57 then) and have persisted more or less since then.  Does not appear to have had LFT elevation addressed.  Finding no abdominal US or abd/pelvic CTs.   Hgb 8.7, up from 7.6 4 days ago.  Baseline 9 - 10.5. PT 22.3, INR 1.9.  Platelets 50 2K, her previous baseline in the last few months is 80 -100.  FOBT + 08/06/2017.   08/08/17  CT head: stable atrophy and small vessel ischemia.  No trauma.     08/11/2017 Esophagram:  Diffuse esophageal spasm, no stricture.   11/1 renal ultrasound: shrunken kidneys.  Mild ascites, left pleural effusion.   08/11/17 bedside swallow evaluation: Suspected primary esophageal dysphagia with complaint of globus, frequent belching, intermittent gagging regurgitation.  Oropharyngeal stages of swallow appear normal and no indication of aspiration. 08/12/17 portable KUB: NO BGP, contrast seen in SB and colon.  Left abdominal calcification within fecal stream or potentially nephrolithiasis.    Pain meds consist of Hydrocodone prn, taking 3 to 2 mg daily in last 3 days, none  today.  Initially treated for 1 days with Morphine.   Weight 178#, 187# 07/15/17, 196# 05/04/17.    Past Medical History:  Diagnosis Date  . Abnormality of gait   . Anemia, unspecified   . Anticoagulated- Eliquis 07/31/2015  . Anxiety state, unspecified   . Aortic stenosis    a. Mild by echo 08/2013, not seen on repeat echo 02/2017.  Marland Kitchen Bifascicular block    afib  . Carotid artery disease (HCC)    a. Carotid dopplers (3/41) with 93-79% LICA stenosis (of note, prior ABI normal).  . Chronic diastolic CHF (congestive heart failure) ([place])    a. Dx 07/2015 - EF 35-40%, unclear etiology ? r/t afib or viral etiology. b. EF normal in 02/2017.  . CKD (chronic kidney disease), stage III ([place])   . Depressive disorder, not elsewhere classified   . DM (diabetes mellitus), type 2, uncontrolled, with renal complications ([place]) 0/24/0973  . Eczema 11/22/2013  . Esophageal dysmotility 08/05/2015  . Essential hypertension   . Female stress incontinence 05/06/2015  . Full dentures   . GERD (gastroesophageal reflux disease)   . Gout   . HOH (hard of hearing)   . Internal hemorrhoids without mention of complication   . Iron deficiency   . Lumbago   . Memory loss   . Mild CAD    a. Cath 2013: 20-30% prox RCA.  Marland Kitchen Myocardial infarction ([place]) 07/2015  . Nonspecific abnormal results of liver function study   . Obesity, unspecified   . Orthostatic hypotension   . Osteoarthritis   . Other and unspecified hyperlipidemia   . PAF (paroxysmal atrial fibrillation) ([place]) 07/11/2015   a. prior DCCVs, amiodarone, anticoagulation.  . Pain in joint, shoulder region 11/22/2013   left   . Personal history of fall   . Presence of permanent cardiac pacemaker   . Pruritus 11/22/2013  . S/P cardiac pacemaker procedure 01/11/2012   a. for symptomatic bradycardia. Followed by Dr. Lovena Le. Device changed out 2017 due to ppm infection.  . Stroke [facility])    a. evidence of prior strokes on MRI in 2018.  Marland Kitchen UTI (urinary tract  infection) 04/2017  . Wears glasses     Past Surgical History:  Procedure Laterality Date  . CARDIAC CATHETERIZATION  12/03/2003   Dr Rex Kras  . CARDIOVERSION N/A 07/25/2015   Procedure: CARDIOVERSION;  Surgeon: Fay Records, MD;  Location: [facility] ENDOSCOPY;  Service: Cardiovascular;  Laterality: N/A;  . CARDIOVERSION N/A 08/06/2015   Procedure: CARDIOVERSION;  Surgeon: Larey Dresser, MD;  Location: [place];  Service: Cardiovascular;  Laterality: N/A;  . CATARACT EXTRACTION W/ INTRAOCULAR LENS  IMPLANT, BILATERAL Bilateral 03/2015-04/2015  . COLONOSCOPY     ?????  . EP IMPLANTABLE DEVICE N/A 04/15/2016   Procedure: Pacemaker Implant;  Surgeon: Evans Lance, MD;  Location: [place] CV LAB;  Service: Cardiovascular;  Laterality: N/A;  . ESOPHAGOGASTRODUODENOSCOPY N/A 08/08/2015   Procedure: ESOPHAGOGASTRODUODENOSCOPY (EGD);  Surgeon: Gatha Mayer, MD;  Location: [facility] ENDOSCOPY;  Service: Endoscopy;  Laterality: N/A;  . EXTREMITY CYST EXCISION     finger  . EYE SURGERY Bilateral    catartact  . INSERT / REPLACE / REMOVE PACEMAKER    . LEFT HEART CATHETERIZATION WITH CORONARY ANGIOGRAM N/A 01/10/2012   Procedure: LEFT HEART CATHETERIZATION WITH CORONARY ANGIOGRAM;  Surgeon: Pixie Casino, MD;  Location: [facility] CATH LAB;  Service: Cardiovascular;  Laterality: N/A;  . ORIF WRIST FRACTURE Right 04/09/2014   Procedure: OPEN REDUCTION INTERNAL FIXATION (ORIF) RIGHT DISPLACED  DISTAL RADIUS  FRACTURE;  Surgeon: Roseanne Kaufman, MD;  Location: [place];  Service: Orthopedics;  Laterality: Right;  . PACEMAKER LEAD REMOVAL  04/12/2016   TEMPORARY PACEMAKER INSERTION  . PACEMAKER LEAD REMOVAL  04/15/2016   Procedure: Pacemaker Lead Removal;  Surgeon: Evans Lance, MD;  Location: [place] CV LAB;  Service: Cardiovascular;;  . PACEMAKER LEAD REMOVAL N/A 04/12/2016   Procedure: PACEMAKER LEAD REMOVAL;  Surgeon: Evans Lance, MD;  Location: [place];  Service: Cardiovascular;  Laterality:  N/A;  . PERMANENT PACEMAKER INSERTION Left 01/11/2012   Procedure: PERMANENT PACEMAKER INSERTION;  Surgeon: Sanda Klein, MD;  Location: [place] CATH LAB;  Service: Cardiovascular;  Laterality: Left;  . TEE WITHOUT CARDIOVERSION N/A 07/25/2015   Procedure: TRANSESOPHAGEAL ECHOCARDIOGRAM (TEE);  Surgeon: Fay Records, MD;  Location: [facility] ENDOSCOPY;  Service: Cardiovascular;  Laterality: N/A;    Prior to Admission medications   Medication Sig Start Date End Date Taking? Authorizing Provider  albuterol (PROVENTIL HFA;VENTOLIN HFA) 108 (90 Base) MCG/ACT inhaler Inhale 2 puffs into the lungs every 4 (four) hours as needed for wheezing or shortness of breath (cough, shortness of breath or wheezing.). 10/15/16  Yes Elby Beck, FNP  allopurinol (ZYLOPRIM) 100 MG tablet Take 2 tablets (200 mg total) by mouth daily. 07/09/17  Yes Aline August, MD  amiodarone (PACERONE) 200 MG tablet TAKE 200MG BY MOUTH DAILY 05/04/17  Yes Tonia Ghent, MD  atorvastatin (LIPITOR) 20 MG tablet Take 1 tablet (20 mg total) by mouth daily at 6 PM. 04/06/17  Yes Velvet Bathe, MD  Blood Glucose Monitoring Suppl (ONE TOUCH ULTRA 2) w/Device KIT Check blood sugar twice a day and as directed Dx E11.21 05/24/17  Yes Tonia Ghent, MD  buPROPion (WELLBUTRIN) 75 MG tablet Take 1 tablet (75 mg total) by mouth 2 (two) times daily. 07/01/17  Yes Tonia Ghent, MD  doxycycline (VIBRA-TABS) 100 MG tablet Take 1 tablet (100 mg total) by mouth 2 (two) times daily. Patient taking differently: Take 100 mg by mouth every morning.  07/26/17  Yes Tonia Ghent, MD  ELIQUIS 2.5 MG TABS tablet TAKE 1 TABLET BY MOUTH TWICE DAILY 07/18/17  Yes Bensimhon, Shaune Pascal, MD  fluticasone (FLONASE) 50 MCG/ACT nasal spray Place 1 spray into both nostrils daily as needed for allergies or rhinitis.   Yes [provider]  glimepiride (AMARYL) 1 MG tablet TAKE 1MG BY MOUTH EVERY MORNING WITH BREAKFAST 05/04/17  Yes Tonia Ghent, MD  glucose  blood (ONE TOUCH ULTRA TEST) test strip CHECK BLOOD SUGAR TWICE A DAY AND AS DIRECTED;DX E11.21 05/24/17  Yes Tonia Ghent, MD  Insulin Glargine (LANTUS SOLOSTAR) 100 UNIT/ML Solostar Pen Inject 20 Units into the skin at bedtime. Patient taking differently: Inject 18 Units into the skin at bedtime.  07/26/17  Yes Elsie Stain  S, MD  Insulin Pen Needle (PEN NEEDLES) 30G X 5 MM MISC Inject 1 Dose as directed daily. Use with insulin pen 01/27/17  Yes Duncan, [name], MD  loratadine (CLARITIN CHILDRENS) 5 MG chewable tablet Chew 1 tablet (5 mg total) by mouth every evening. 07/26/17  Yes Duncan, [name], MD  metoprolol succinate (TOPROL-XL) 25 MG 24 hr tablet Take 1 tablet (25 mg total) by mouth 2 (two) times daily. 04/05/17  Yes Vega, Orlando, MD  Multiple Vitamins-Minerals (ONE-A-DAY 50 PLUS PO) Take 1 capsule by mouth daily.   Yes [provider]  pantoprazole (PROTONIX) 40 MG tablet TAKE 40MG BY MOUTH DAILY 05/04/17  Yes Duncan, [name], MD  potassium chloride SA (K-DUR,KLOR-CON) 20 MEQ tablet Take 20 mEq by mouth daily.   Yes [provider]  senna-docusate (SENOKOT-S) 8.6-50 MG tablet Take 0.5 tablets by mouth daily. 07/26/17  Yes Duncan, [name], MD  simethicone (GAS RELIEF) 80 MG chewable tablet CHEW 80 MG BY MOUTH EVERY 6 HOURS AS NEEDED FOR GAS 07/26/17  Yes Duncan, [name], MD  torsemide (DEMADEX) 20 MG tablet Take 4 tablets (80 mg total) by mouth daily. 07/12/17  Yes McLean, Dalton S, MD  traZODone (DESYREL) 50 MG tablet Take 0.5-1 tablets (25-50 mg total) by mouth at bedtime as needed for sleep. Don't take >50mg in a day. 01/21/16  Yes Duncan, [name], MD    Scheduled Meds: . allopurinol  200 mg Oral Daily  . amiodarone  200 mg Oral Daily  . apixaban  2.5 mg Oral BID  . atorvastatin  20 mg Oral q1800  . buPROPion  75 mg Oral BID  . insulin aspart  0-9 Units Subcutaneous Q4H  . insulin glargine  20 Units Subcutaneous QHS  . loratadine  5 mg Oral QPM  .  pantoprazole  40 mg Oral Daily  . polyethylene glycol  17 g Oral Daily  . senna-docusate  0.5 tablet Oral Daily  . sodium chloride flush  3 mL Intravenous Q12H  . sodium chloride flush  3 mL Intravenous Q12H   Infusions: . sodium chloride    . sodium chloride 100 mL/hr at 08/12/17 0715  . ceFEPime (MAXIPIME) IV Stopped (08/11/17 2331)  . vancomycin Stopped (08/12/17 0256)   PRN Meds: sodium chloride, acetaminophen **OR** acetaminophen, bisacodyl, fluticasone, HYDROcodone-acetaminophen, morphine injection, ondansetron (ZOFRAN) IV, ondansetron **OR** [DISCONTINUED] ondansetron (ZOFRAN) IV, senna-docusate, simethicone, sodium chloride flush, sodium chloride flush, traZODone   Allergies as of 07/16/2017 - Review Complete 08/02/2017  Allergen Reaction Noted  . Beta adrenergic blockers Other (See Comments) 02/14/2008  . Jardiance [empagliflozin] Other (See Comments) 01/23/2015  . Lexapro [escitalopram oxalate] Other (See Comments) 06/22/2017  . Spironolactone Other (See Comments) 04/03/2017  . Zoloft [sertraline hcl] Other (See Comments) 06/22/2017    Family History  Problem Relation Age of Onset  . Breast cancer Mother   . Cancer Mother   . Diabetes Daughter   . Hypertension Daughter   . Cancer Brother     Social History   Social History  . Marital status: Widowed    Spouse name: N/A  . Number of children: N/A  . Years of education: N/A   Occupational History  . Not on file.   Social History Main Topics  . Smoking status: Never Smoker  . Smokeless tobacco: Never Used  . Alcohol use No  . Drug use: No  . Sexual activity: No   Other Topics Concern  . Not on file   Social History   Narrative   Physically inactive. She says she hurts in her back and knees too much to do much walking.   9th grade   Worked in multiple mills   Living with daughter    Doesn't drive as of 17/7116    REVIEW OF SYSTEMS: Constitutional: Malaise, weakness. ENT:  No nose bleeds Pulm: No  dyspnea or cough. CV:  No palpitations, no LE edema.  GU:  No hematuria, no frequency GI:  Per HPI Heme: Bruises and bleeds easily as she takes Eliquis. Transfusions: No transfusion records found in epic. Neuro:  No headaches, no peripheral tingling or numbness Derm:  No itching, no rash or sores.  Lots of bruises from her recent fall on the shoulders and her back. Endocrine:  No sweats or chills.  No polyuria or dysuria Immunization: Reviewed her multiple vaccination.  Current on her flu shot, Pneumovax and multiple other vaccinations Travel:  None beyond local counties in last few months.    PHYSICAL EXAM: Vital signs in last 24 hours: Vitals:   08/12/17 0500 08/12/17 0900  BP: (!) 94/45 116/66  Pulse: 70 70  Resp: 15 14  Temp: 97.7 F (36.5 C) 97.8 F (36.6 C)  SpO2: 95%    Wt Readings from Last 3 Encounters:  08/05/2017 80.8 kg (178 lb 3.2 oz)  07/26/17 83.1 kg (183 lb 2 oz)  07/18/17 84.5 kg (186 lb 6 oz)    General: Pale, chronically ill, obese, elderly WF.  Comfortable at rest. Head: No facial asymmetry or swelling.  No signs of head trauma. Eyes: No scleral icterus.  Conjunctiva is pale.  EOMI. Ears: Not hard of hearing. Nose: No discharge or congestion. Mouth: Edentulous.  Oral mucosa moist and clear.  Tongue midline. Neck: No masses.  No JVD.  No thyromegaly. Lungs: Diminished but clear bilaterally.  No labored breathing or cough. Heart: Paced regular rhythm.  No MRG.  S1, S2 present. Abdomen: Obese.  Soft.  Nontender.  Bowel sounds normal but hypoactive.  Fullness with possible hepatomegaly in the right upper quadrant.  No bruits.  No hernias..   Rectal: Deferred rectal exam and it is very painful and problematic for the patient to turn on her side. Musc/Skeltl: Ace wrapping the left arm. Heme: Bruising apparent across the shoulders bilaterally and in the right, rear flank. Extremities: Slightly pitting edema, anasarca into the thighs. Neurologic: Alert.   Oriented x3.  Moves all 4 limbs, strength not tested.  No tremor or asterixis. Skin: Bruising as above.  No open sores or rashes.  No suspicious lesions. Nodes: No cervical adenopathy. Psych: Cooperative, pleasant, engaged.  Intake/Output from previous day: 11/01 0701 - 11/02 0700 In: 360 [P.O.:360] Out: 400 [Urine:400] Intake/Output this shift: No intake/output data recorded.  LAB RESULTS:  Recent Labs  08/10/17 1125 08/12/17 0352  WBC 13.9* 10.2  HGB 9.8* 8.7*  HCT 30.3* 27.6*  PLT 67* 52*   BMET Lab Results  Component Value Date   NA 136 08/12/2017   NA 138 08/11/2017   NA 136 08/10/2017   K 3.9 08/12/2017   K 4.6 08/11/2017   K 4.2 08/10/2017   CL 103 08/12/2017   CL 105 08/11/2017   CL 102 08/10/2017   CO2 20 (L) 08/12/2017   CO2 16 (L) 08/11/2017   CO2 21 (L) 08/10/2017   GLUCOSE 99 08/12/2017   GLUCOSE 81 08/11/2017   GLUCOSE 112 (H) 08/10/2017   BUN 76 (H) 08/12/2017   BUN 75 (H) 08/11/2017   BUN  70 (H) 08/10/2017   CREATININE 4.22 (H) 08/12/2017   CREATININE 3.99 (H) 08/11/2017   CREATININE 3.94 (H) 08/10/2017   CALCIUM 8.2 (L) 08/12/2017   CALCIUM 8.1 (L) 08/11/2017   CALCIUM 8.4 (L) 08/10/2017   LFT No results for input(s): PROT, ALBUMIN, AST, ALT, ALKPHOS, BILITOT, BILIDIR, IBILI in the last 72 hours. PT/INR Lab Results  Component Value Date   INR 1.98 07/08/2017   INR 1.82 06/03/2017   INR 1.54 03/31/2017   Hepatitis Panel No results for input(s): HEPBSAG, HCVAB, HEPAIGM, HEPBIGM in the last 72 hours. C-Diff No components found for: CDIFF Lipase  No results found for: LIPASE  Drugs of Abuse     Component Value Date/Time   LABOPIA NONE DETECTED 03/31/2017 1256   COCAINSCRNUR NONE DETECTED 03/31/2017 1256   LABBENZ NONE DETECTED 03/31/2017 1256   AMPHETMU NONE DETECTED 03/31/2017 1256   THCU NONE DETECTED 03/31/2017 1256   LABBARB NONE DETECTED 03/31/2017 1256     RADIOLOGY STUDIES: Dg Esophagus  Result Date:  08/11/2017 CLINICAL DATA:  3 week history of nausea and vomiting.  Weight loss. EXAM: ESOPHOGRAM/BARIUM SWALLOW TECHNIQUE: Single contrast examination was performed using  thin barium. FLUOROSCOPY TIME:  Fluoroscopy Time:  1.4 minutes Radiation Exposure Index (if provided by the fluoroscopic device): 12.8 mGy Number of Acquired Spot Images: 0 COMPARISON:  None. FINDINGS: Limited examination secondary to decreased patient mobility. Entire examination was performed in the semi upright position. There was normal pharyngeal anatomy and motility. Contrast flowed freely through the esophagus without evidence of stricture or mass. There was normal esophageal mucosa without evidence of irregularity or ulceration. Diffuse tertiary contractions throughout the esophagus consistent with spasm. No evidence of reflux. No definite hiatal hernia was demonstrated. IMPRESSION: 1. Diffuse esophageal spasm. 2. No esophageal stricture. Electronically Signed   By: Hetal  Patel   On: 08/11/2017 14:22   Us Renal  Result Date: 08/11/2017 CLINICAL DATA:  Acute renal injury. EXAM: RENAL / URINARY TRACT ULTRASOUND COMPLETE COMPARISON:  Ultrasound 07/21/2015 . FINDINGS: Right Kidney: Length: 7.9 cm. Right kidney is small and echogenic consistent with chronic medical renal disease and atrophy. No mass. No hydronephrosis . Left Kidney: Length: 7.9 cm. Left kidney is small and echogenic consistent with chronic medical renal disease and atrophy. No mass. No hydronephrosis. Bladder: Appears normal for degree of bladder distention. Mild ascites. Left pleural effusion noted. IMPRESSION: 1. Kidneys are small and echogenic consistent chronic medical renal disease and atrophy. 2.  No hydronephrosis.  No bladder distention. 3.  Mild ascites.  Left pleural effusion noted. Electronically Signed   By: Thomas  Register   On: 08/11/2017 11:57    IMPRESSION:   *  Nausea and vomiting.  Elevated LFTs.  Mild ascites noted on renal ultrasound.  R/o  liver/biliary disease.  R/o gastroparesis in type 1 diabetic. R/o due to worsening renal function and associated uremia.    *  Dysphagia.  Chronic, stable.     *  Anemia of chronic disease and CKD, iron deficient in 07/2015 but no iron assays since then.  MCV currently normal.   FOBT + but no actual stools and no BPR.  Significant bruising may account for acute Hgb decline.    *  Constipation.  Acute on chronic.  Has taken little in way of po in last several days.  Senekot and Miralax not helping.     *  Long standing thrombocytopenia.  However, current level of 52 is below recent 80s - low 100s.    *    Left wrist fx, non-op mgt.    *  AKI, stage 4, now stage 5 CKD. Oliguric. Renal following.   *  Chronic Eliquis still in place.     PLAN:     *  Ultrasound abdomen, today hopefully.  Ordered NPO to allow for this, sipping on soda and OJ in last few hours will delay ultrasound for several hours.    *  Repeat LFTs, CBC, Lipase in AM.  .    *  Dulcolax PR.  Adding scheduled Zofran. Ideally would hold all narcotics but with recent wrist fx, this may not be possible.  Using small daily amounts of prn Hydrocodone so will not d/c.    Sarah Gribbin  08/12/2017, 11:22 AM Pager: [phone]  GI ATTENDING  History, laboratories, x-rays, prior endoscopy reports reviewed. Patient personally seen and examined. Family in room. Incredibly complicated 85-year-old with multiple significant acute and chronic medical problems who we're asked to see regarding problems with nausea and vomiting. She has multiple potential etiologies. These include uremia, multiple medical problems, multiple medications, diabetic gastroparesis, worsening reflux. Agree with pantoprazole and running doses of antiemetics Zofran. Also agree with abdominal ultrasound to rule out obvious gallbladder disease as an etiology. Discussed with family. Dr. Hung will follow up for GI this weekend  John N. Perry, Jr., M.D. [place]  [facility] Division of Gastroenterology   

## 2017-08-12 NOTE — Progress Notes (Signed)
Hypoglycemic Event  CBG: 58  Treatment: D50 IV 25 mL  Symptoms: None  Follow-up CBG: Time:1749 CBG Result:116  Possible Reasons for Event: Other: NPO/No appetite  Comments/MD notified: Dr. Caleb Popp notified. Orders for D5-1/2 NS fluids.     Rolanda Lundborg

## 2017-08-12 NOTE — Progress Notes (Addendum)
PROGRESS NOTE    Danielle Benitez  RUE:[id number] DOB: 1931/11/09 DOA: 08/09/2017 PCP: Joaquim Namuncan, [name], MD   Brief Narrative: Danielle GraceLula V Farro is a 83 y.o. female with a medical history of insulin-dependent diabetes mellitus, hypertension, history of CVA, fibrillation on Eliquis, chronic diastolic heart failure, chronic kidney disease stage IV.  She presented with severe left wrist pain found to have a left wrist fracture.  Orthopedic surgery was consulted and opted for nonoperative management.  PT and OT pending.   Assessment & Plan:   Principal Problem:   Left wrist fracture, closed, initial encounter Active Problems:   DM, type 2, with renal complications    Mild CAD - 20-30% RCA in 2013   Depression   CKD (chronic kidney disease), stage IV (HCC)   Essential hypertension   Normocytic anemia   Atrial fibrillation - 07/11/15 by PPM interrogation, recurrent 10/19 after DCCV 10/14   Thrombocytopenia (HCC)   Chronic diastolic CHF (congestive heart failure) (HCC)   History of CVA (cerebrovascular accident)   Occult GI bleeding   HCAP (healthcare-associated pneumonia)   Closed fracture of left wrist   Fall   Left wrist fracture Closed fracture. Pain improved. -orthopedic surgery recommendations: non-operative management -continue Norco prn  HCAP Blood cultures negative to date. Sputum gram stain/culture not available. -continue cefepime -discontinue vancomycin  Diabetes mellitus, type 2 -continue Lantus -continue SSI  CAD 20-30% RCA -continue atorvastatin  Depression -continue Wellbutrin  Acute kidney injury on CKD stage IV No significant signs of uremia. Baseline creatinine of 2.2-2.5. Urine output about 500 mL over the last 24 hours. This is likely secondary to hypovolemia in the setting of recurrent emesis, diuresis and decreased oral intake. Creatinine continues to rise, in addition to BUN. -nephrology consult -IV fluids -renal ultrasound to rule out  hydronephrosis  Essential hypertension -discontinued metoprolol secondary to hypotension  Normocytic anemia Stable. Chronic.  Atrial fibrillation Patient with PPM. -continue Eliquis -continue amiodarone  -discontinued Toprol secondary to hypotension  Thrombocytopenia Stable. Chronic. Unknown etiology. Chart review shows no workup. No spontaneous bleeding. -smear review -trend CBC  Chronic diastolic heart failure Last echo from 04/2017 significant for an EF of 60-65% with grade 1 diastolic dysfunction  History of CVA Stable -continue Eliquis  History of dysphagia Emesis Patient with an EGD back in 2016 significant for dysmotility. Dysphagia 3 diet recommended at that time. Now with symptoms of emesis with oral intake. Improved with Zofran. This appears to be contributing to her overall recent weight loss. Esophagram significant for esophageal dysmotility. Doubt this is related to uremia since it has been present for the last few weeks and doubt secondary to narcotics for the same reason. No bowel movement. No history of gastroparesis. -abdominal x-ray -continue Zofran -continue Protonix -GI consult  DVT prophylaxis: Eliquis Code Status: Full code Family Communication: Daughter and son-in-law at bedside Disposition Plan: CIR/SNF   Consultants:   Orthopedic surgery  Procedures:   None  Antimicrobials:  Vancomycin (10/28>>  Cefepime (10/28>>    Subjective: Continued emesis. Feels weak.  Objective: Vitals:   08/11/17 1543 08/11/17 2034 08/12/17 0500 08/12/17 0900  BP: (!) 99/47 (!) 110/50 (!) 94/45 116/66  Pulse: 70 69 70 70  Resp: 16 15 15 14   Temp: (!) 97.4 F (36.3 C) 98.3 F (36.8 C) 97.7 F (36.5 C) 97.8 F (36.6 C)  TempSrc: Oral Oral Oral Oral  SpO2: 100% 97% 95%   Weight:      Height:  Intake/Output Summary (Last 24 hours) at 08/12/17 1102 Last data filed at 08/11/17 1928  Gross per 24 hour  Intake              240 ml  Output               400 ml  Net             -160 ml   Filed Weights   08/24/17 2110  Weight: 80.8 kg (178 lb 3.2 oz)    Examination:  General exam: Appears calm and comfortable Respiratory system: Clear to auscultation. Respiratory effort normal. Cardiovascular system: Regular rate and rhythm. Normal S1 and S2. No heart murmurs present. No extra heart sounds Gastrointestinal system: Abdomen is nondistended, soft and nontender. No organomegaly or masses felt. Normal bowel sounds heard. Central nervous system: Alert and oriented to person and place. No focal neurological deficits. Extremities: No edema. No calf tenderness. Left arm in cast Skin: No cyanosis. No rashes Psychiatry: Judgement and insight appear impaired. Mood & affect appropriate.     Data Reviewed: I have personally reviewed following labs and imaging studies  CBC:  Recent Labs Lab 08-24-17 2231 08/08/17 0309 08/10/17 1125 08/12/17 0352  WBC 12.4* 9.2 13.9* 10.2  NEUTROABS 8.5* 6.2  --   --   HGB 9.1* 7.6* 9.8* 8.7*  HCT 28.6* 24.1* 30.3* 27.6*  MCV 95.7 94.1 94.7 94.8  PLT 135* 67* 67* 52*   Basic Metabolic Panel:  Recent Labs Lab 08/08/17 0309 08/10/17 0526 08/10/17 1852 08/11/17 0603 08/12/17 0352  NA 136 137 136 138 136  K 4.1 4.1 4.2 4.6 3.9  CL 101 102 102 105 103  CO2 26 23 21* 16* 20*  GLUCOSE 284* 105* 112* 81 99  BUN 60* 67* 70* 75* 76*  CREATININE 3.51* 3.67* 3.94* 3.99* 4.22*  CALCIUM 7.8* 8.3* 8.4* 8.1* 8.2*   GFR: Estimated Creatinine Clearance: 10 mL/min (A) (by C-G formula based on SCr of 4.22 mg/dL (H)). Liver Function Tests:  Recent Labs Lab 08/08/17 0043 08/08/17 0309  AST 88* 81*  ALT 34 29  ALKPHOS 191* 158*  BILITOT 3.0* 2.5*  PROT 6.5 5.3*  ALBUMIN 2.2* 1.9*   No results for input(s): LIPASE, AMYLASE in the last 168 hours. No results for input(s): AMMONIA in the last 168 hours. Coagulation Profile: No results for input(s): INR, PROTIME in the last 168 hours. Cardiac  Enzymes: No results for input(s): CKTOTAL, CKMB, CKMBINDEX, TROPONINI in the last 168 hours. BNP (last 3 results)  Recent Labs  09/20/16 1458 03/16/17 1441 04/11/17 1345  PROBNP 140.0* 287.0* 215.0*   HbA1C: No results for input(s): HGBA1C in the last 72 hours. CBG:  Recent Labs Lab 08/11/17 1445 08/11/17 1541 08/11/17 2030 08/12/17 0006 08/12/17 0400  GLUCAP 62* 84 102* 103* 115*   Lipid Profile: No results for input(s): CHOL, HDL, LDLCALC, TRIG, CHOLHDL, LDLDIRECT in the last 72 hours. Thyroid Function Tests: No results for input(s): TSH, T4TOTAL, FREET4, T3FREE, THYROIDAB in the last 72 hours. Anemia Panel: No results for input(s): VITAMINB12, FOLATE, FERRITIN, TIBC, IRON, RETICCTPCT in the last 72 hours. Sepsis Labs:  Recent Labs Lab 08/08/17 0058 08/08/17 0309 08/08/17 0721 08/09/17 0552 08/09/17 1846  PROCALCITON  --  0.22  --  0.26  --   LATICACIDVEN 2.53* 2.0* 2.7*  --  2.1*    Recent Results (from the past 240 hour(s))  Blood culture (routine x 2)     Status: None (Preliminary result)  Collection Time: 08/08/17 12:43 AM  Result Value Ref Range Status   Specimen Description BLOOD RIGHT ARM  Final   Special Requests   Final    BOTTLES DRAWN AEROBIC AND ANAEROBIC Blood Culture adequate volume   Culture NO GROWTH 4 DAYS  Final   Report Status PENDING  Incomplete  Blood culture (routine x 2)     Status: None (Preliminary result)   Collection Time: 08/08/17  1:44 AM  Result Value Ref Range Status   Specimen Description BLOOD RIGHT HAND  Final   Special Requests   Final    BOTTLES DRAWN AEROBIC ONLY Blood Culture adequate volume   Culture NO GROWTH 3 DAYS  Final   Report Status PENDING  Incomplete  MRSA PCR Screening     Status: None   Collection Time: 08/11/17 11:48 AM  Result Value Ref Range Status   MRSA by PCR NEGATIVE NEGATIVE Final    Comment:        The GeneXpert MRSA Assay (FDA approved for NASAL specimens only), is one component of  a comprehensive MRSA colonization surveillance program. It is not intended to diagnose MRSA infection nor to guide or monitor treatment for MRSA infections.          Radiology Studies: Dg Esophagus  Result Date: 08/11/2017 CLINICAL DATA:  3 week history of nausea and vomiting.  Weight loss. EXAM: ESOPHOGRAM/BARIUM SWALLOW TECHNIQUE: Single contrast examination was performed using  thin barium. FLUOROSCOPY TIME:  Fluoroscopy Time:  1.4 minutes Radiation Exposure Index (if provided by the fluoroscopic device): 12.8 mGy Number of Acquired Spot Images: 0 COMPARISON:  None. FINDINGS: Limited examination secondary to decreased patient mobility. Entire examination was performed in the semi upright position. There was normal pharyngeal anatomy and motility. Contrast flowed freely through the esophagus without evidence of stricture or mass. There was normal esophageal mucosa without evidence of irregularity or ulceration. Diffuse tertiary contractions throughout the esophagus consistent with spasm. No evidence of reflux. No definite hiatal hernia was demonstrated. IMPRESSION: 1. Diffuse esophageal spasm. 2. No esophageal stricture. Electronically Signed   By: Elige Ko   On: 08/11/2017 14:22   US Renal  Result Date: 08/11/2017 CLINICAL DATA:  Acute renal injury. EXAM: RENAL / URINARY TRACT ULTRASOUND COMPLETE COMPARISON:  Ultrasound 07/21/2015 . FINDINGS: Right Kidney: Length: 7.9 cm. Right kidney is small and echogenic consistent with chronic medical renal disease and atrophy. No mass. No hydronephrosis . Left Kidney: Length: 7.9 cm. Left kidney is small and echogenic consistent with chronic medical renal disease and atrophy. No mass. No hydronephrosis. Bladder: Appears normal for degree of bladder distention. Mild ascites. Left pleural effusion noted. IMPRESSION: 1. Kidneys are small and echogenic consistent chronic medical renal disease and atrophy. 2.  No hydronephrosis.  No bladder distention. 3.   Mild ascites.  Left pleural effusion noted. Electronically Signed   By: Maisie Fus  Register   On: 08/11/2017 11:57        Scheduled Meds: . allopurinol  200 mg Oral Daily  . amiodarone  200 mg Oral Daily  . apixaban  2.5 mg Oral BID  . atorvastatin  20 mg Oral q1800  . buPROPion  75 mg Oral BID  . insulin aspart  0-9 Units Subcutaneous Q4H  . insulin glargine  20 Units Subcutaneous QHS  . loratadine  5 mg Oral QPM  . pantoprazole  40 mg Oral Daily  . polyethylene glycol  17 g Oral Daily  . senna-docusate  0.5 tablet Oral Daily  . sodium  chloride flush  3 mL Intravenous Q12H  . sodium chloride flush  3 mL Intravenous Q12H   Continuous Infusions: . sodium chloride    . sodium chloride 100 mL/hr at 08/12/17 0715  . ceFEPime (MAXIPIME) IV Stopped (08/11/17 2331)  . vancomycin Stopped (08/12/17 0256)     LOS: 4 days     Jacquelin Hawking, MD [facility] 08/12/2017, 11:02 AM Pager: [phone]  If 7PM-7AM, please contact night-coverage [email or web address] Password [email or web address] 08/12/2017, 11:02 AM

## 2017-08-13 ENCOUNTER — Inpatient Hospital Stay (HOSPITAL_COMMUNITY): Payer: Medicare Other

## 2017-08-13 DIAGNOSIS — Z7189 Other specified counseling: Secondary | ICD-10-CM

## 2017-08-13 DIAGNOSIS — Z515 Encounter for palliative care: Secondary | ICD-10-CM

## 2017-08-13 DIAGNOSIS — K7460 Unspecified cirrhosis of liver: Secondary | ICD-10-CM

## 2017-08-13 DIAGNOSIS — R188 Other ascites: Secondary | ICD-10-CM

## 2017-08-13 LAB — HEPATIC FUNCTION PANEL
ALK PHOS: 190 U/L — AB (ref 38–126)
ALT: 28 U/L (ref 14–54)
AST: 73 U/L — ABNORMAL HIGH (ref 15–41)
Albumin: 1.6 g/dL — ABNORMAL LOW (ref 3.5–5.0)
BILIRUBIN INDIRECT: 1.2 mg/dL — AB (ref 0.3–0.9)
Bilirubin, Direct: 1.1 mg/dL — ABNORMAL HIGH (ref 0.1–0.5)
TOTAL PROTEIN: 5.8 g/dL — AB (ref 6.5–8.1)
Total Bilirubin: 2.3 mg/dL — ABNORMAL HIGH (ref 0.3–1.2)

## 2017-08-13 LAB — BASIC METABOLIC PANEL
Anion gap: 12 (ref 5–15)
BUN: 74 mg/dL — AB (ref 6–20)
CALCIUM: 7.9 mg/dL — AB (ref 8.9–10.3)
CO2: 18 mmol/L — ABNORMAL LOW (ref 22–32)
Chloride: 104 mmol/L (ref 101–111)
Creatinine, Ser: 4.12 mg/dL — ABNORMAL HIGH (ref 0.44–1.00)
GFR calc Af Amer: 11 mL/min — ABNORMAL LOW (ref >60)
GFR, EST NON AFRICAN AMERICAN: 9 mL/min — AB (ref >60)
GLUCOSE: 158 mg/dL — AB (ref 65–99)
POTASSIUM: 4.6 mmol/L (ref 3.5–5.1)
Sodium: 134 mmol/L — ABNORMAL LOW (ref 135–145)

## 2017-08-13 LAB — LIPASE, BLOOD: Lipase: 20 U/L (ref 11–51)

## 2017-08-13 LAB — CULTURE, BLOOD (ROUTINE X 2)
Culture: NO GROWTH
Culture: NO GROWTH
SPECIAL REQUESTS: ADEQUATE
Special Requests: ADEQUATE

## 2017-08-13 LAB — CBC
HEMATOCRIT: 26.9 % — AB (ref 36.0–46.0)
Hemoglobin: 8.5 g/dL — ABNORMAL LOW (ref 12.0–15.0)
MCH: 30.2 pg (ref 26.0–34.0)
MCHC: 31.6 g/dL (ref 30.0–36.0)
MCV: 95.7 fL (ref 78.0–100.0)
Platelets: 45 10*3/uL — ABNORMAL LOW (ref 150–400)
RBC: 2.81 MIL/uL — ABNORMAL LOW (ref 3.87–5.11)
RDW: 21.8 % — AB (ref 11.5–15.5)
WBC: 9.9 10*3/uL (ref 4.0–10.5)

## 2017-08-13 LAB — GLUCOSE, CAPILLARY
GLUCOSE-CAPILLARY: 169 mg/dL — AB (ref 65–99)
GLUCOSE-CAPILLARY: 161 mg/dL — AB (ref 65–99)
GLUCOSE-CAPILLARY: 166 mg/dL — AB (ref 65–99)
GLUCOSE-CAPILLARY: 159 mg/dL — AB (ref 65–99)
Glucose-Capillary: 137 mg/dL — ABNORMAL HIGH (ref 65–99)

## 2017-08-13 LAB — PHOSPHORUS: PHOSPHORUS: 5.2 mg/dL — AB (ref 2.5–4.6)

## 2017-08-13 MED ORDER — INSULIN GLARGINE 100 UNIT/ML ~~LOC~~ SOLN
10.0000 [IU] | Freq: Every day | SUBCUTANEOUS | Status: DC
  Administered 2017-08-13: 10 [IU] via SUBCUTANEOUS
  Filled 2017-08-13 (×2): qty 0.1

## 2017-08-13 MED ORDER — SODIUM CHLORIDE 0.9 % IV SOLN: INTRAVENOUS | Status: DC

## 2017-08-13 MED ORDER — DEXTROSE-NACL 5-0.45 % IV SOLN
INTRAVENOUS | Status: DC
  Administered 2017-08-13 (×2): via INTRAVENOUS

## 2017-08-13 NOTE — Progress Notes (Signed)
Progress Note for [facility]  Subjective: No complaints of nausea at this time.  Objective: Vital signs in last 24 hours: Temp:  [97.4 F (36.3 C)-97.9 F (36.6 C)] 97.9 F (36.6 C) (11/03 0403) Pulse Rate:  [66-70] 66 (11/03 0403) BP: (104-116)/(51-60) 115/60 (11/03 0403) SpO2:  [93 %-100 %] 93 % (11/03 0403) Last BM Date: 08-19-17  Intake/Output from previous day: No intake/output data recorded. Intake/Output this shift: No intake/output data recorded.  General appearance: no distress GI: soft, non-tender; bowel sounds normal; no masses,  no organomegaly  Lab Results:  Recent Labs  08/12/17 0352 08/13/17 0612  WBC 10.2 9.9  HGB 8.7* 8.5*  HCT 27.6* 26.9*  PLT 52* 45*   BMET  Recent Labs  08/11/17 0603 08/12/17 0352 08/13/17 0612  NA 138 136 134*  K 4.6 3.9 4.6  CL 105 103 104  CO2 16* 20* 18*  GLUCOSE 81 99 158*  BUN 75* 76* 74*  CREATININE 3.99* 4.22* 4.12*  CALCIUM 8.1* 8.2* 7.9*   LFT  Recent Labs  08/13/17 0612  PROT 5.8*  ALBUMIN 1.6*  AST 73*  ALT 28  ALKPHOS 190*  BILITOT 2.3*  BILIDIR 1.1*  IBILI 1.2*   PT/INR No results for input(s): LABPROT, INR in the last 72 hours. Hepatitis Panel No results for input(s): HEPBSAG, HCVAB, HEPAIGM, HEPBIGM in the last 72 hours. C-Diff No results for input(s): CDIFFTOX in the last 72 hours. Fecal Lactopherrin No results for input(s): FECLLACTOFRN in the last 72 hours.  Studies/Results: Dg Esophagus  Result Date: 08/11/2017 CLINICAL DATA:  3 week history of nausea and vomiting.  Weight loss. EXAM: ESOPHOGRAM/BARIUM SWALLOW TECHNIQUE: Single contrast examination was performed using  thin barium. FLUOROSCOPY TIME:  Fluoroscopy Time:  1.4 minutes Radiation Exposure Index (if provided by the fluoroscopic device): 12.8 mGy Number of Acquired Spot Images: 0 COMPARISON:  None. FINDINGS: Limited examination secondary to decreased patient mobility. Entire examination was performed in the semi upright  position. There was normal pharyngeal anatomy and motility. Contrast flowed freely through the esophagus without evidence of stricture or mass. There was normal esophageal mucosa without evidence of irregularity or ulceration. Diffuse tertiary contractions throughout the esophagus consistent with spasm. No evidence of reflux. No definite hiatal hernia was demonstrated. IMPRESSION: 1. Diffuse esophageal spasm. 2. No esophageal stricture. Electronically Signed   By: Elige Ko   On: 08/11/2017 14:22   US Abdomen Limited  Result Date: 08/12/2017 CLINICAL DATA:  Elevated LFTs.  Nausea and vomiting.  Ascites. EXAM: ULTRASOUND ABDOMEN LIMITED RIGHT UPPER QUADRANT COMPARISON:  None. FINDINGS: Gallbladder: Gallbladder is physiologically distended. No gallstones or wall thickening visualized. No sonographic Murphy sign noted by sonographer. Common bile duct: Diameter: 2 mm, normal. Liver: Shrunken with nodular contours. There is increased coarsened echogenicity. No focal lesion demonstrated sonographically. Portal vein is patent on color Doppler imaging with normal direction of blood flow towards the liver. Intra-abdominal ascites greatest in the right abdomen, small to moderate in degree. IMPRESSION: 1. Cirrhotic hepatic morphology. No focal lesion demonstrated sonographically. 2. Normal sonographic appearance of the gallbladder and biliary tree. 3. Ascites. Electronically Signed   By: Rubye Oaks M.D.   On: 08/12/2017 21:41   Dg Abd Portable 1v  Result Date: 08/12/2017 CLINICAL DATA:  83 year old female with a history of vomiting EXAM: PORTABLE ABDOMEN - 1 VIEW COMPARISON:  07/30/2017, 07/08/2017 FINDINGS: Gas within stomach, small bowel, colon. Small amount of retained enteric contrast within small bowel and colon. No abnormal distention. The gas extends  to the rectum. Multilevel degenerative changes of the spine with associated scoliotic curvature. No unexpected soft tissue density. No unexpected  radiopaque foreign body. Pelvic calcifications. Calcification the left abdomen may reflect nephrolithiasis or calcification within the fecal stream. IMPRESSION: Nonobstructive bowel gas pattern with retained enteric contrast within small bowel and colon. Rounded calcification in the left abdomen may reflect calcification within the fecal stream or potentially nephrolithiasis. Electronically Signed   By: Gilmer MorJaime  Wagner D.O.   On: 08/12/2017 12:58    Medications:  Scheduled: . allopurinol  200 mg Oral Daily  . amiodarone  200 mg Oral Daily  . apixaban  2.5 mg Oral BID  . atorvastatin  20 mg Oral q1800  . buPROPion  75 mg Oral BID  . insulin aspart  0-9 Units Subcutaneous Q4H  . insulin glargine  10 Units Subcutaneous QHS  . loratadine  5 mg Oral QPM  . ondansetron  4 mg Intravenous Q8H  . pantoprazole  40 mg Oral Daily  . polyethylene glycol  17 g Oral Daily  . senna-docusate  0.5 tablet Oral Daily  . sodium chloride flush  3 mL Intravenous Q12H  . sodium chloride flush  3 mL Intravenous Q12H   Continuous: . sodium chloride    . ceFEPime (MAXIPIME) IV Stopped (08/13/17 0425)  . dextrose 5 % and 0.45% NaCl 75 mL/hr at 08/13/17 1206    Assessment/Plan: 1) Nausea and vomiting. 2) Renal failure. 3) Multiple medical problems.   It is a high likelihood that her symptoms are as a result of her uremia.  She also has a significant list of comorbidities that also contribute to her symptoms.  There is no evidence of any gallstones.  From the GI standpoint no intervention is recommended at this time.  Her symptoms will most likely wax and wane.  Plan: 1) Continue with medical therapy. 2) Signing off.  LOS: 5 days   Soren Pigman D 08/13/2017, 12:32 PM

## 2017-08-13 NOTE — Progress Notes (Signed)
PROGRESS NOTE    Danielle Benitez  CYE:[id number] DOB: Aug 19, 1932 DOA: 08/06/2017 PCP: Joaquim Nam, MD   Brief Narrative: Danielle Benitez is a 83 y.o. female with a medical history of insulin-dependent diabetes mellitus, hypertension, history of CVA, fibrillation on Eliquis, chronic diastolic heart failure, chronic kidney disease stage IV.  She presented with severe left wrist pain found to have a left wrist fracture.  Orthopedic surgery was consulted and opted for nonoperative management.  PT and OT pending.   Assessment & Plan:   Principal Problem:   Left wrist fracture, closed, initial encounter Active Problems:   DM, type 2, with renal complications    Mild CAD - 20-30% RCA in 2013   Depression   CKD (chronic kidney disease), stage IV (HCC)   Essential hypertension   Normocytic anemia   Atrial fibrillation - 07/11/15 by PPM interrogation, recurrent 10/19 after DCCV 10/14   Thrombocytopenia (HCC)   Chronic diastolic CHF (congestive heart failure) (HCC)   History of CVA (cerebrovascular accident)   Occult GI bleeding   HCAP (healthcare-associated pneumonia)   Closed fracture of left wrist   Fall   Palliative care by specialist   Goals of care, counseling/discussion   Left wrist fracture Closed fracture. Pain improved. -orthopedic surgery recommendations: non-operative management -continue Norco prn  HCAP Blood cultures negative to date. Sputum gram stain/culture not available. -continue cefepime -discontinue vancomycin  Diabetes mellitus, type 2 -continue Lantus -continue SSI  CAD 20-30% RCA -continue atorvastatin  Depression -continue Wellbutrin  Acute kidney injury on CKD stage IV No significant signs of uremia. Baseline creatinine of 2.2-2.5. Urine output about 500 mL over the last 24 hours. This is likely secondary to hypovolemia in the setting of recurrent emesis, diuresis and decreased oral intake. Creatinine has reached nadir.  -nephrology consult -IV  fluids  Essential hypertension -discontinued metoprolol secondary to hypotension  Normocytic anemia Stable. Chronic.  Atrial fibrillation Patient with PPM. -continue Eliquis -continue amiodarone  -discontinued Toprol secondary to hypotension  Thrombocytopenia Worsening. Chronic. Unknown etiology. Chart review shows no workup. No spontaneous bleeding. Discussed with hematology and recommends to continue Eliquis at current dose. Likely contributed to be new diagnosis of cirrhosis -smear review pending -trend CBC -LUQ ultrasound to assess for splenomegaly  Chronic diastolic heart failure Last echo from 04/2017 significant for an EF of 60-65% with grade 1 diastolic dysfunction  History of CVA Stable -continue Eliquis  History of dysphagia Emesis Patient with an EGD back in 2016 significant for dysmotility. Dysphagia 3 diet recommended at that time. Now with symptoms of emesis with oral intake. Improved with Zofran. This appears to be contributing to her overall recent weight loss. Esophagram significant for esophageal dysmotility. Unsure if this is related to uremia since it has been present for the last few weeks and doubt secondary to narcotics for the same reason but appears to be worsened in current setting. Small bowel movement. No history of gastroparesis. -continue Zofran -continue Protonix -GI consult -will consider Reglan pending GI note today  Cirrhosis As seen on ultrasound. Likely causing thrombocytopenia. Unknown etiology -hepatitis panel  DVT prophylaxis: Eliquis Code Status: Full code Family Communication: Daughter and son-in-law at bedside Disposition Plan: Possibly CIR vs Residential hospice depending on disease trajectory    Consultants:   Orthopedic surgery  Procedures:   None  Antimicrobials:  Vancomycin (10/28>>  Cefepime (10/28>>    Subjective: Feels better today. Still vomiting.  Objective: Vitals:   08/12/17 0900 08/12/17 1705  08/12/17 2002 08/13/17 0403  BP: 116/66 (!) 116/51 (!) 104/59 115/60  Pulse: 70 70 70 66  Resp: 14     Temp: 97.8 F (36.6 C) (!) 97.4 F (36.3 C) 97.6 F (36.4 C) 97.9 F (36.6 C)  TempSrc: Oral Oral Oral Axillary  SpO2:  99% 100% 93%  Weight:      Height:       No intake or output data in the 24 hours ending 08/13/17 1229 Filed Weights   07/24/2017 2110  Weight: 80.8 kg (178 lb 3.2 oz)    Examination:  General exam: Appears calm and comfortable. Appears fatigued Respiratory system: Clear to auscultation. Respiratory effort normal. Cardiovascular system: Regular rate and rhythm. Normal S1 and S2. No heart murmurs present. No extra heart sounds Gastrointestinal system: Abdomen is nondistended, soft and nontender. Normal bowel sounds heard. Central nervous system: Alert and oriented to person and place. No focal neurological deficits. Extremities: No edema. No calf tenderness. Left arm in cast Skin: No cyanosis. No rashes Psychiatry: Judgement and insight appear impaired. Mood & affect appropriate.     Data Reviewed: I have personally reviewed following labs and imaging studies  CBC:  Recent Labs Lab 07/15/2017 2231 08/08/17 0309 08/10/17 1125 08/12/17 0352 08/13/17 0612  WBC 12.4* 9.2 13.9* 10.2 9.9  NEUTROABS 8.5* 6.2  --   --   --   HGB 9.1* 7.6* 9.8* 8.7* 8.5*  HCT 28.6* 24.1* 30.3* 27.6* 26.9*  MCV 95.7 94.1 94.7 94.8 95.7  PLT 135* 67* 67* 52* 45*   Basic Metabolic Panel:  Recent Labs Lab 08/10/17 0526 08/10/17 1852 08/11/17 0603 08/12/17 0352 08/13/17 0612 08/13/17 0834  NA 137 136 138 136 134*  --   K 4.1 4.2 4.6 3.9 4.6  --   CL 102 102 105 103 104  --   CO2 23 21* 16* 20* 18*  --   GLUCOSE 105* 112* 81 99 158*  --   BUN 67* 70* 75* 76* 74*  --   CREATININE 3.67* 3.94* 3.99* 4.22* 4.12*  --   CALCIUM 8.3* 8.4* 8.1* 8.2* 7.9*  --   PHOS  --   --   --   --   --  5.2*   GFR: Estimated Creatinine Clearance: 10.2 mL/min (A) (by C-G formula based  on SCr of 4.12 mg/dL (H)). Liver Function Tests:  Recent Labs Lab 08/08/17 0043 08/08/17 0309 08/13/17 0612  AST 88* 81* 73*  ALT 34 29 28  ALKPHOS 191* 158* 190*  BILITOT 3.0* 2.5* 2.3*  PROT 6.5 5.3* 5.8*  ALBUMIN 2.2* 1.9* 1.6*    Recent Labs Lab 08/13/17 0612  LIPASE 20   No results for input(s): AMMONIA in the last 168 hours. Coagulation Profile: No results for input(s): INR, PROTIME in the last 168 hours. Cardiac Enzymes: No results for input(s): CKTOTAL, CKMB, CKMBINDEX, TROPONINI in the last 168 hours. BNP (last 3 results)  Recent Labs  09/20/16 1458 03/16/17 1441 04/11/17 1345  PROBNP 140.0* 287.0* 215.0*   HbA1C: No results for input(s): HGBA1C in the last 72 hours. CBG:  Recent Labs Lab 08/12/17 1749 08/12/17 2013 08/13/17 0002 08/13/17 0409 08/13/17 0813  GLUCAP 116* 98 135* 137* 169*   Lipid Profile: No results for input(s): CHOL, HDL, LDLCALC, TRIG, CHOLHDL, LDLDIRECT in the last 72 hours. Thyroid Function Tests: No results for input(s): TSH, T4TOTAL, FREET4, T3FREE, THYROIDAB in the last 72 hours. Anemia Panel: No results for input(s): VITAMINB12, FOLATE, FERRITIN, TIBC, IRON, RETICCTPCT in the last 72 hours. Sepsis  Labs:  Recent Labs Lab 08/08/17 0058 08/08/17 0309 08/08/17 0721 08/09/17 0552 08/09/17 1846  PROCALCITON  --  0.22  --  0.26  --   LATICACIDVEN 2.53* 2.0* 2.7*  --  2.1*    Recent Results (from the past 240 hour(s))  Blood culture (routine x 2)     Status: None (Preliminary result)   Collection Time: 08/08/17 12:43 AM  Result Value Ref Range Status   Specimen Description BLOOD RIGHT ARM  Final   Special Requests   Final    BOTTLES DRAWN AEROBIC AND ANAEROBIC Blood Culture adequate volume   Culture NO GROWTH 4 DAYS  Final   Report Status PENDING  Incomplete  Blood culture (routine x 2)     Status: None (Preliminary result)   Collection Time: 08/08/17  1:44 AM  Result Value Ref Range Status   Specimen Description  BLOOD RIGHT HAND  Final   Special Requests   Final    BOTTLES DRAWN AEROBIC ONLY Blood Culture adequate volume   Culture NO GROWTH 4 DAYS  Final   Report Status PENDING  Incomplete  MRSA PCR Screening     Status: None   Collection Time: 08/11/17 11:48 AM  Result Value Ref Range Status   MRSA by PCR NEGATIVE NEGATIVE Final    Comment:        The GeneXpert MRSA Assay (FDA approved for NASAL specimens only), is one component of a comprehensive MRSA colonization surveillance program. It is not intended to diagnose MRSA infection nor to guide or monitor treatment for MRSA infections.          Radiology Studies: Dg Esophagus  Result Date: 08/11/2017 CLINICAL DATA:  3 week history of nausea and vomiting.  Weight loss. EXAM: ESOPHOGRAM/BARIUM SWALLOW TECHNIQUE: Single contrast examination was performed using  thin barium. FLUOROSCOPY TIME:  Fluoroscopy Time:  1.4 minutes Radiation Exposure Index (if provided by the fluoroscopic device): 12.8 mGy Number of Acquired Spot Images: 0 COMPARISON:  None. FINDINGS: Limited examination secondary to decreased patient mobility. Entire examination was performed in the semi upright position. There was normal pharyngeal anatomy and motility. Contrast flowed freely through the esophagus without evidence of stricture or mass. There was normal esophageal mucosa without evidence of irregularity or ulceration. Diffuse tertiary contractions throughout the esophagus consistent with spasm. No evidence of reflux. No definite hiatal hernia was demonstrated. IMPRESSION: 1. Diffuse esophageal spasm. 2. No esophageal stricture. Electronically Signed   By: Elige KoHetal  Patel   On: 08/11/2017 14:22   [place] Abdomen Limited  Result Date: 08/12/2017 CLINICAL DATA:  Elevated LFTs.  Nausea and vomiting.  Ascites. EXAM: ULTRASOUND ABDOMEN LIMITED RIGHT UPPER QUADRANT COMPARISON:  None. FINDINGS: Gallbladder: Gallbladder is physiologically distended. No gallstones or wall thickening  visualized. No sonographic Murphy sign noted by sonographer. Common bile duct: Diameter: 2 mm, normal. Liver: Shrunken with nodular contours. There is increased coarsened echogenicity. No focal lesion demonstrated sonographically. Portal vein is patent on color Doppler imaging with normal direction of blood flow towards the liver. Intra-abdominal ascites greatest in the right abdomen, small to moderate in degree. IMPRESSION: 1. Cirrhotic hepatic morphology. No focal lesion demonstrated sonographically. 2. Normal sonographic appearance of the gallbladder and biliary tree. 3. Ascites. Electronically Signed   By: Rubye OaksMelanie  Ehinger M.D.   On: 08/12/2017 21:41   Dg Abd Portable 1v  Result Date: 08/12/2017 CLINICAL DATA:  83 year old female with a history of vomiting EXAM: PORTABLE ABDOMEN - 1 VIEW COMPARISON:  07/30/2017, 07/08/2017 FINDINGS: Gas within stomach, small  bowel, colon. Small amount of retained enteric contrast within small bowel and colon. No abnormal distention. The gas extends to the rectum. Multilevel degenerative changes of the spine with associated scoliotic curvature. No unexpected soft tissue density. No unexpected radiopaque foreign body. Pelvic calcifications. Calcification the left abdomen may reflect nephrolithiasis or calcification within the fecal stream. IMPRESSION: Nonobstructive bowel gas pattern with retained enteric contrast within small bowel and colon. Rounded calcification in the left abdomen may reflect calcification within the fecal stream or potentially nephrolithiasis. Electronically Signed   By: Gilmer Mor D.O.   On: 08/12/2017 12:58        Scheduled Meds: . allopurinol  200 mg Oral Daily  . amiodarone  200 mg Oral Daily  . apixaban  2.5 mg Oral BID  . atorvastatin  20 mg Oral q1800  . buPROPion  75 mg Oral BID  . insulin aspart  0-9 Units Subcutaneous Q4H  . insulin glargine  10 Units Subcutaneous QHS  . loratadine  5 mg Oral QPM  . ondansetron  4 mg  Intravenous Q8H  . pantoprazole  40 mg Oral Daily  . polyethylene glycol  17 g Oral Daily  . senna-docusate  0.5 tablet Oral Daily  . sodium chloride flush  3 mL Intravenous Q12H  . sodium chloride flush  3 mL Intravenous Q12H   Continuous Infusions: . sodium chloride    . ceFEPime (MAXIPIME) IV Stopped (08/13/17 0425)  . dextrose 5 % and 0.45% NaCl 75 mL/hr at 08/13/17 1206     LOS: 5 days     Jacquelin Hawking, MD [facility] 08/13/2017, 12:29 PM Pager: [phone]  If 7PM-7AM, please contact night-coverage [email or web address] Password [facility] 08/13/2017, 12:29 PM

## 2017-08-13 NOTE — Progress Notes (Signed)
Physical Therapy Treatment Patient Details Name: Danielle GraceLula V Marner MRN: [id number] DOB: 09/24/1932 Today's Date: 08/13/2017    History of Present Illness Patient is an 83 y/o female admitted s/p fall with L wrist fx (non-op).  PMH positive for DM, CKD, dCHF, HLD, HTN, A-fib on eliquis, prior h/o stroke, recent admissions with UTI/resp infection.     PT Comments    Family present in room upon PT arrival. Daughter emotional and stating patient is likely in kidney failure with Palliative Care to be consulted. PT attempting to assist with patient sitting EOB, however, patient with very low arousal status likely to only increase her fall risk. Transfers deferred due to arousal state. Able to perform some B LE active/active assist movements. Will continue to follow acutely to maximize functional mobility.    Follow Up Recommendations  CIR     Equipment Recommendations  Wheelchair (measurements PT);Wheelchair cushion (measurements PT)    Recommendations for Other Services       Precautions / Restrictions Restrictions Weight Bearing Restrictions: Yes LUE Weight Bearing: Non weight bearing    Mobility  Bed Mobility Overal bed mobility: Needs Assistance Bed Mobility: Supine to Sit     Supine to sit: HOB elevated;Max assist     General bed mobility comments: attempted to sit EOB, however, patient with poor arousal, thus deferred  Transfers Overall transfer level: Needs assistance                  Ambulation/Gait                 Stairs            Wheelchair Mobility    Modified Rankin (Stroke Patients Only)       Balance Overall balance assessment: Needs assistance (unable to sit EOB today due to poor arousal)                                          Cognition Arousal/Alertness: Lethargic   Overall Cognitive Status: Within Functional Limits for tasks assessed                                 General Comments: patient  seemingly sleepy during visit. patient reporting she has not been sleeping well.       Exercises General Exercises - Lower Extremity Ankle Circles/Pumps: Both;10 reps Heel Slides: Both;10 reps;AAROM Hip ABduction/ADduction: Both;10 reps;AAROM    General Comments        Pertinent Vitals/Pain Pain Assessment: No/denies pain    Home Living                      Prior Function            PT Goals (current goals can now be found in the care plan section) Acute Rehab PT Goals Patient Stated Goal: To potentially go to rehab PT Goal Formulation: With patient/family Time For Goal Achievement: 08/17/17 Potential to Achieve Goals: Fair    Frequency    Min 3X/week      PT Plan Current plan remains appropriate    Co-evaluation              AM-PAC PT "6 Clicks" Daily Activity  Outcome Measure  Difficulty turning over in bed (including adjusting bedclothes, sheets and blankets)?: A Lot Difficulty moving from  lying on back to sitting on the side of the bed? : Unable Difficulty sitting down on and standing up from a chair with arms (e.g., wheelchair, bedside commode, etc,.)?: Unable Help needed moving to and from a bed to chair (including a wheelchair)?: Total Help needed walking in hospital room?: Total Help needed climbing 3-5 steps with a railing? : Total 6 Click Score: 7    End of Session   Activity Tolerance: Patient limited by fatigue;Patient limited by lethargy Patient left: in bed;with call bell/phone within reach;with family/visitor present;with SCD's reapplied Nurse Communication: Mobility status PT Visit Diagnosis: Unsteadiness on feet (R26.81);History of falling (Z91.81);Other abnormalities of gait and mobility (R26.89);Pain     Time: 1048-1101 PT Time Calculation (min) (ACUTE ONLY): 13 min  Charges:  $Therapeutic Exercise: 8-22 mins                    G Codes:      Kipp Laurence, PT, DPT 08/13/17 11:13 AM

## 2017-08-13 NOTE — Consult Note (Signed)
Consultation Note Date: 08/13/2017   Patient Name: Danielle Benitez  DOB: 1932-05-14  MRN: [id number]  Age / Sex: 83 y.o., female  PCP: Danielle Ghent, MD Referring Physician: Mariel Aloe, MD  Reason for Consultation: Establishing goals of care  HPI/Patient Profile: 83 y.o. female admitted on 08/06/2017    Clinical Assessment and Goals of Care:  83 year old lady who lives at home with her daughter Danielle Benitez in [place], [place]. She has a past medical history significant for diabetes, hypertension history of stroke, atrial fibrillation she was on Eliquis, also with chronic diastolic heart failure stage IV chronic kidney disease. It is noted that the patient has had gradual progressive decline for the past few months. She is noted to have been feeling poorly. She started not eating and having vomiting for 3 weeks now. She presented with fall and left wrist pain found to have left wrist fracture for which orthopedic surgery was consultative and nonoperative splint management was done. Subsequently, gastroenterology was consultative in this hospitalization for nausea vomiting possible constipation, history of esophageal dysmotility.   Patient also with worsening kidney failure, acute kidney injury only daily on top of stage IV chronic kidney disease, hypotension, possible nephrosclerosis. Patient with declining oral intake, ongoing weakness, deemed likely not an appropriate dialysis candidate.  Renal function continues to worsen. Palliative consultation for goals of care discussions.  Patient is an elderly, weak appearing lady resting in bed. Physical therapy is in the room. Patient falls asleep very quickly. She does not appear to be in any distress. She has a sling.  Discussed extensively with daughter, healthcare power of attorney and son-in-law with whom the patient lives. Goals wishes and values discussed. Patient's  daughter is extremely tearful. She states that the patient herself, even prior to this hospitalization, had decided that she did not want to be on dialysis and that she did not want a feeding tube. We discussed about CODE STATUS and now her CODE STATUS is established as DO NOT RESUSCITATE.   We discussed about patient's disease trajectory, possible prognosis, appropriate disposition planning. Patient's family does not want hospice of [facility] for personal reasons. We discussed about appropriate disposition options.  If the patient has ongoing renal function decline, if she continues to have declining oral intake, less alertness and awakeness, I fear that she may become residential hospice appropriate very soon. I have discussed this frankly and compassionately with family. Daughter becomes very tearful and states that she is afraid of losing the patient but also realizes how sick she is becoming and how rapidly she is declining even in this hospitalization.  HCPOA  daughter is Psychiatrist.    SUMMARY OF RECOMMENDATIONS    CODE STATUS now clarified as DO NOT RESUSCITATE/DO NOT INTUBATE.  time-limited trial of current therapies for the next 24-48 hours.   Consider residential hospice if ongoing decline in renal function, ongoing decline in awakeness and alertness, ongoing decline in oral intake. Discussed with family above  No PEG tube, no dialysis as has been decided  by the patient.  Code Status/Advance Care Planning:  DNR    Symptom Management:     As above  Palliative Prophylaxis:   Bowel Regimen  Psycho-social/Spiritual:   Desire for further Chaplaincy support:yes  Additional Recommendations: Education on Hospice  Prognosis:   Guarded.   Discharge Planning: ?residential hospice, if renal function and PO intake declines further.      Primary Diagnoses: Present on Admission: . DM, type 2, with renal complications  . Mild CAD - 20-30% RCA in 2013 . CKD (chronic  kidney disease), stage IV ([place]) . Chronic diastolic CHF (congestive heart failure) ([place]) . Depression . Atrial fibrillation - 07/11/15 by PPM interrogation, recurrent 10/19 after DCCV 10/14 . Thrombocytopenia ([place]) . Essential hypertension . Normocytic anemia . Occult GI bleeding . Left wrist fracture, closed, initial encounter . HCAP (healthcare-associated pneumonia)   I have reviewed the medical record, interviewed the patient and family, and examined the patient. The following aspects are pertinent.  Past Medical History:  Diagnosis Date  . Abnormality of gait   . Anemia, unspecified   . Anticoagulated- Eliquis 07/31/2015  . Anxiety state, unspecified   . Aortic stenosis    a. Mild by echo 08/2013, not seen on repeat echo 02/2017.  Marland Kitchen Bifascicular block    afib  . Carotid artery disease (HCC)    a. Carotid dopplers (0/25) with 42-70% LICA stenosis (of note, prior ABI normal).  . Chronic diastolic CHF (congestive heart failure) ([place])    a. Dx 07/2015 - EF 35-40%, unclear etiology ? r/t afib or viral etiology. b. EF normal in 02/2017.  . CKD (chronic kidney disease), stage III ([place])   . Depressive disorder, not elsewhere classified   . DM (diabetes mellitus), type 2, uncontrolled, with renal complications ([place]) 04/03/7627  . Eczema 11/22/2013  . Esophageal dysmotility 08/05/2015  . Essential hypertension   . Female stress incontinence 05/06/2015  . Full dentures   . GERD (gastroesophageal reflux disease)   . Gout   . HOH (hard of hearing)   . Internal hemorrhoids without mention of complication   . Iron deficiency   . Lumbago   . Memory loss   . Mild CAD    a. Cath 2013: 20-30% prox RCA.  Marland Kitchen Myocardial infarction () 07/2015  . Nonspecific abnormal results of liver function study   . Obesity, unspecified   . Orthostatic hypotension   . Osteoarthritis   . Other and unspecified hyperlipidemia   . PAF (paroxysmal atrial fibrillation) ([place]) 07/11/2015   a. prior DCCVs,  amiodarone, anticoagulation.  . Pain in joint, shoulder region 11/22/2013   left   . Personal history of fall   . Presence of permanent cardiac pacemaker   . Pruritus 11/22/2013  . S/P cardiac pacemaker procedure 01/11/2012   a. for symptomatic bradycardia. Followed by Dr. Lovena Le. Device changed out 2017 due to ppm infection.  . Stroke [facility])    a. evidence of prior strokes on MRI in 2018.  Marland Kitchen UTI (urinary tract infection) 04/2017  . Wears glasses    Social History   Social History  . Marital status: Widowed    Spouse name: N/A  . Number of children: N/A  . Years of education: N/A   Social History Main Topics  . Smoking status: Never Smoker  . Smokeless tobacco: Never Used  . Alcohol use No  . Drug use: No  . Sexual activity: No   Other Topics Concern  . None   Social History Narrative  Physically inactive. She says she hurts in her back and knees too much to do much walking.   9th grade   Worked in multiple [place] with daughter    Doesn't drive as of 16/1096   Family History  Problem Relation Age of Onset  . Breast cancer Mother   . Cancer Mother   . Diabetes Daughter   . Hypertension Daughter   . Cancer Brother    Scheduled Meds: . allopurinol  200 mg Oral Daily  . amiodarone  200 mg Oral Daily  . apixaban  2.5 mg Oral BID  . atorvastatin  20 mg Oral q1800  . buPROPion  75 mg Oral BID  . insulin aspart  0-9 Units Subcutaneous Q4H  . insulin glargine  10 Units Subcutaneous QHS  . loratadine  5 mg Oral QPM  . ondansetron  4 mg Intravenous Q8H  . pantoprazole  40 mg Oral Daily  . polyethylene glycol  17 g Oral Daily  . senna-docusate  0.5 tablet Oral Daily  . sodium chloride flush  3 mL Intravenous Q12H  . sodium chloride flush  3 mL Intravenous Q12H   Continuous Infusions: . sodium chloride    . ceFEPime (MAXIPIME) IV Stopped (08/13/17 0425)  . dextrose 5 % and 0.45% NaCl 100 mL/hr at 08/13/17 0930   PRN Meds:.sodium chloride, acetaminophen  **OR** acetaminophen, bisacodyl, fluticasone, HYDROcodone-acetaminophen, morphine injection, ondansetron (ZOFRAN) IV, senna-docusate, simethicone, sodium chloride flush, sodium chloride flush, traZODone Medications Prior to Admission:  Prior to Admission medications   Medication Sig Start Date End Date Taking? Authorizing Provider  albuterol (PROVENTIL HFA;VENTOLIN HFA) 108 (90 Base) MCG/ACT inhaler Inhale 2 puffs into the lungs every 4 (four) hours as needed for wheezing or shortness of breath (cough, shortness of breath or wheezing.). 10/15/16  Yes Elby Beck, FNP  allopurinol (ZYLOPRIM) 100 MG tablet Take 2 tablets (200 mg total) by mouth daily. 07/09/17  Yes Aline August, MD  amiodarone (PACERONE) 200 MG tablet TAKE '200MG'$  BY MOUTH DAILY 05/04/17  Yes Danielle Ghent, MD  atorvastatin (LIPITOR) 20 MG tablet Take 1 tablet (20 mg total) by mouth daily at 6 PM. 04/06/17  Yes Velvet Bathe, MD  Blood Glucose Monitoring Suppl (ONE TOUCH ULTRA 2) w/Device KIT Check blood sugar twice a day and as directed Dx E11.21 05/24/17  Yes Danielle Ghent, MD  buPROPion (WELLBUTRIN) 75 MG tablet Take 1 tablet (75 mg total) by mouth 2 (two) times daily. 07/01/17  Yes Danielle Ghent, MD  doxycycline (VIBRA-TABS) 100 MG tablet Take 1 tablet (100 mg total) by mouth 2 (two) times daily. Patient taking differently: Take 100 mg by mouth every morning.  07/26/17  Yes Danielle Ghent, MD  ELIQUIS 2.5 MG TABS tablet TAKE 1 TABLET BY MOUTH TWICE DAILY 07/18/17  Yes Bensimhon, Shaune Pascal, MD  fluticasone (FLONASE) 50 MCG/ACT nasal spray Place 1 spray into both nostrils daily as needed for allergies or rhinitis.   Yes [provider]  glimepiride (AMARYL) 1 MG tablet TAKE '1MG'$  BY MOUTH EVERY MORNING WITH BREAKFAST 05/04/17  Yes Danielle Ghent, MD  glucose blood (ONE TOUCH ULTRA TEST) test strip CHECK BLOOD SUGAR TWICE A DAY AND AS DIRECTED;DX E11.21 05/24/17  Yes Danielle Ghent, MD  Insulin Glargine (LANTUS  SOLOSTAR) 100 UNIT/ML Solostar Pen Inject 20 Units into the skin at bedtime. Patient taking differently: Inject 18 Units into the skin at bedtime.  07/26/17  Yes Danielle Ghent, MD  Insulin  Pen Needle (PEN NEEDLES) 30G X 5 MM MISC Inject 1 Dose as directed daily. Use with insulin pen 01/27/17  Yes Danielle Ghent, MD  loratadine (CLARITIN CHILDRENS) 5 MG chewable tablet Chew 1 tablet (5 mg total) by mouth every evening. 07/26/17  Yes Danielle Ghent, MD  metoprolol succinate (TOPROL-XL) 25 MG 24 hr tablet Take 1 tablet (25 mg total) by mouth 2 (two) times daily. 04/05/17  Yes Velvet Bathe, MD  Multiple Vitamins-Minerals (ONE-A-DAY 50 PLUS PO) Take 1 capsule by mouth daily.   Yes [provider]  pantoprazole (PROTONIX) 40 MG tablet TAKE '40MG'$  BY MOUTH DAILY 05/04/17  Yes Danielle Ghent, MD  potassium chloride SA (K-DUR,KLOR-CON) 20 MEQ tablet Take 20 mEq by mouth daily.   Yes [provider]  senna-docusate (SENOKOT-S) 8.6-50 MG tablet Take 0.5 tablets by mouth daily. 07/26/17  Yes Danielle Ghent, MD  simethicone (GAS RELIEF) 80 MG chewable tablet CHEW 80 MG BY MOUTH EVERY 6 HOURS AS NEEDED FOR GAS 07/26/17  Yes Danielle Ghent, MD  torsemide (DEMADEX) 20 MG tablet Take 4 tablets (80 mg total) by mouth daily. 07/12/17  Yes Larey Dresser, MD  traZODone (DESYREL) 50 MG tablet Take 0.5-1 tablets (25-50 mg total) by mouth at bedtime as needed for sleep. Don't take >'50mg'$  in a day. 01/21/16  Yes Danielle Ghent, MD   Allergies  Allergen Reactions  . Beta Adrenergic Blockers Other (See Comments)    Couldn't speak or hear Metoprolol seems ok  . Jardiance [Empagliflozin] Other (See Comments)    Rash, kidney issues  . Lexapro [Escitalopram Oxalate] Other (See Comments)    Lack of effect.   Marland Kitchen Spironolactone Other (See Comments)    Hyperkalemia  . Zoloft [Sertraline Hcl] Other (See Comments)    Lack of effect.    Review of Systems generalized weakness, frailty Physical  Exam Weak elderly lady resting in bed has a sling on her left arm Awake answers a few questions S1-S2 Shallow clear Bruises Trace edema  Vital Signs: BP 115/60 (BP Location: Right Arm)   Pulse 66   Temp 97.9 F (36.6 C) (Axillary)   Resp 14   Ht '5\' 2"'$  (1.575 m)   Wt 80.8 kg (178 lb 3.2 oz)   SpO2 93%   BMI 32.59 kg/m  Pain Assessment: 0-10 POSS *See Group Information*: S-Acceptable,Sleep, easy to arouse Pain Score: 5    SpO2: SpO2: 93 % O2 Device:SpO2: 93 % O2 Flow Rate: .   IO: Intake/output summary: No intake or output data in the 24 hours ending 08/13/17 1139  LBM: Last BM Date: 07/20/2017 Baseline Weight: Weight: 80.8 kg (178 lb 3.2 oz) Most recent weight: Weight: 80.8 kg (178 lb 3.2 oz)     Palliative Assessment/Data:   Flowsheet Rows     Most Recent Value  Intake Tab  Referral Department  Hospitalist  Unit at Time of Referral  Med/Surg Unit  Palliative Care Primary Diagnosis  Nephrology  Palliative Care Type  New Palliative care  Reason for referral  Clarify Goals of Care, Counsel Regarding Hospice  Date first seen by Palliative Care  08/13/17  Clinical Assessment  Palliative Performance Scale Score  30%  Pain Max last 24 hours  4  Pain Min Last 24 hours  3  Dyspnea Max Last 24 Hours  3  Dyspnea Min Last 24 hours  2  Nausea Max Last 24 Hours  3  Nausea Min Last 24 Hours  2  Anxiety  Max Last 24 Hours  3  Anxiety Min Last 24 Hours  2  Psychosocial & Spiritual Assessment  Palliative Care Outcomes  Patient/Family meeting held?  Yes  Who was at the meeting?  patient daughter son in law.   Palliative Care Outcomes  Clarified goals of care     Palliative performance scale 30% Time In:  11 Time Out:  12.10 Time Total:  70 Greater than 50%  of this time was spent counseling and coordinating care related to the above assessment and plan.  Signed by: Loistine Chance, MD  [phone] Please contact Palliative Medicine Team phone at [phone] for questions and  concerns.  For individual provider: See Shea Evans

## 2017-08-13 NOTE — Progress Notes (Signed)
  [facility] Progress Note   Assessment/ Plan:   Assessment:  1  Oliguric AKI, hemodynamically mediated (low BP, A fib, ?mild AS): I believe she's uremic.  Discussed with pt and family that she's a poor candidate for dialysis.  I have discussed this with her and her family.  Pall care c/s, greatly appreciate.  Have decreased fluids to 75 mL/ hr. 2  CKD 4, prob nephrosclerosis 3  Hypotension, poss due to hypovolemia- cortisol (normal)  Subjective:    With family.  Denies pain or discomfort.   Objective:   BP 115/60 (BP Location: Right Arm)   Pulse 66   Temp 97.9 F (36.6 C) (Axillary)   Resp 14   Ht 5\' 2"  (1.575 m)   Wt 80.8 kg (178 lb 3.2 oz)   SpO2 93%   BMI 32.59 kg/m   Physical Exam: GEN NAD, lying in bed HEENT sclerae anicteric NECK no JVD PULM clear anteriorly CV irregular, II/VI systolic mrumur ABD hypoactive BS EXT no LE edema NEURO AAO x 3 SKIN: mult ecchymoses  Labs: BMET  Recent Labs Lab 08/08/17 0043 08/08/17 0309 08/10/17 0526 08/10/17 1852 08/11/17 0603 08/12/17 0352 08/13/17 0612 08/13/17 0834  NA 138 136 137 136 138 136 134*  --   K 3.9 4.1 4.1 4.2 4.6 3.9 4.6  --   CL 99* 101 102 102 105 103 104  --   CO2 27 26 23  21* 16* 20* 18*  --   GLUCOSE 243* 284* 105* 112* 81 99 158*  --   BUN 60* 60* 67* 70* 75* 76* 74*  --   CREATININE 3.76* 3.51* 3.67* 3.94* 3.99* 4.22* 4.12*  --   CALCIUM 8.4* 7.8* 8.3* 8.4* 8.1* 8.2* 7.9*  --   PHOS  --   --   --   --   --   --   --  5.2*   CBC  Recent Labs Lab 07/17/2017 2231 08/08/17 0309 08/10/17 1125 08/12/17 0352 08/13/17 0612  WBC 12.4* 9.2 13.9* 10.2 9.9  NEUTROABS 8.5* 6.2  --   --   --   HGB 9.1* 7.6* 9.8* 8.7* 8.5*  HCT 28.6* 24.1* 30.3* 27.6* 26.9*  MCV 95.7 94.1 94.7 94.8 95.7  PLT 135* 67* 67* 52* 45*    @IMGRELPRIORS @ Medications:    . allopurinol  200 mg Oral Daily  . amiodarone  200 mg Oral Daily  . apixaban  2.5 mg Oral BID  . atorvastatin  20 mg Oral q1800   . buPROPion  75 mg Oral BID  . insulin aspart  0-9 Units Subcutaneous Q4H  . insulin glargine  10 Units Subcutaneous QHS  . loratadine  5 mg Oral QPM  . ondansetron  4 mg Intravenous Q8H  . pantoprazole  40 mg Oral Daily  . polyethylene glycol  17 g Oral Daily  . senna-docusate  0.5 tablet Oral Daily  . sodium chloride flush  3 mL Intravenous Q12H  . sodium chloride flush  3 mL Intravenous Q12H     Bufford Buttner, MD [facility] pgr [phone] 08/13/2017, 12:53 PM

## 2017-08-14 LAB — RENAL FUNCTION PANEL
ALBUMIN: 1.6 g/dL — AB (ref 3.5–5.0)
ANION GAP: 12 (ref 5–15)
BUN: 74 mg/dL — ABNORMAL HIGH (ref 6–20)
CHLORIDE: 103 mmol/L (ref 101–111)
CO2: 18 mmol/L — AB (ref 22–32)
CREATININE: 4.29 mg/dL — AB (ref 0.44–1.00)
Calcium: 8.1 mg/dL — ABNORMAL LOW (ref 8.9–10.3)
GFR calc non Af Amer: 9 mL/min — ABNORMAL LOW (ref >60)
GFR, EST AFRICAN AMERICAN: 10 mL/min — AB (ref >60)
Glucose, Bld: 138 mg/dL — ABNORMAL HIGH (ref 65–99)
Phosphorus: 5.1 mg/dL — ABNORMAL HIGH (ref 2.5–4.6)
Potassium: 4 mmol/L (ref 3.5–5.1)
SODIUM: 133 mmol/L — AB (ref 135–145)

## 2017-08-14 LAB — CBC
HCT: 27.5 % — ABNORMAL LOW (ref 36.0–46.0)
HEMOGLOBIN: 8.8 g/dL — AB (ref 12.0–15.0)
MCH: 30.2 pg (ref 26.0–34.0)
MCHC: 32 g/dL (ref 30.0–36.0)
MCV: 94.5 fL (ref 78.0–100.0)
Platelets: 52 10*3/uL — ABNORMAL LOW (ref 150–400)
RBC: 2.91 MIL/uL — AB (ref 3.87–5.11)
RDW: 22 % — ABNORMAL HIGH (ref 11.5–15.5)
WBC: 9.6 10*3/uL (ref 4.0–10.5)

## 2017-08-14 LAB — GLUCOSE, CAPILLARY
GLUCOSE-CAPILLARY: 133 mg/dL — AB (ref 65–99)
GLUCOSE-CAPILLARY: 109 mg/dL — AB (ref 65–99)
GLUCOSE-CAPILLARY: 127 mg/dL — AB (ref 65–99)
GLUCOSE-CAPILLARY: 54 mg/dL — AB (ref 65–99)
GLUCOSE-CAPILLARY: 118 mg/dL — AB (ref 65–99)
Glucose-Capillary: 107 mg/dL — ABNORMAL HIGH (ref 65–99)
Glucose-Capillary: 127 mg/dL — ABNORMAL HIGH (ref 65–99)
Glucose-Capillary: 152 mg/dL — ABNORMAL HIGH (ref 65–99)
Glucose-Capillary: 74 mg/dL (ref 65–99)

## 2017-08-14 LAB — HEPATITIS PANEL, ACUTE
HCV Ab: <0.1 s/co ratio (ref 0.0–0.9)
HEP B S AG: NEGATIVE
Hep A IgM: NEGATIVE
Hep B C IgM: NEGATIVE

## 2017-08-14 MED ORDER — FENTANYL CITRATE (PF) 100 MCG/2ML IJ SOLN
25.0000 ug | INTRAMUSCULAR | Status: DC | PRN
  Administered 2017-08-17: 25 ug via INTRAVENOUS
  Filled 2017-08-14: qty 2

## 2017-08-14 MED ORDER — DEXTROSE 50 % IV SOLN
INTRAVENOUS | Status: AC
  Filled 2017-08-14: qty 50

## 2017-08-14 MED ORDER — METOCLOPRAMIDE HCL 5 MG PO TABS
5.0000 mg | ORAL_TABLET | Freq: Two times a day (BID) | ORAL | Status: DC
  Filled 2017-08-14: qty 1

## 2017-08-14 MED ORDER — DEXTROSE 50 % IV SOLN
50.0000 mL | Freq: Once | INTRAVENOUS | Status: AC
  Administered 2017-08-14: 50 mL via INTRAVENOUS

## 2017-08-14 MED ORDER — MORPHINE SULFATE (PF) 4 MG/ML IV SOLN: 0.5000 mg | INTRAVENOUS | Status: DC | PRN

## 2017-08-14 MED ORDER — SODIUM CHLORIDE 0.9% FLUSH: 10.0000 mL | INTRAVENOUS | Status: DC | PRN

## 2017-08-14 MED ORDER — SODIUM CHLORIDE 0.9% FLUSH
10.0000 mL | Freq: Two times a day (BID) | INTRAVENOUS | Status: DC
  Administered 2017-08-14 – 2017-08-19 (×7): 10 mL

## 2017-08-14 NOTE — Progress Notes (Signed)
PROGRESS NOTE    Danielle Benitez  BVQ:[id number] DOB: October 27, 1931 DOA: Sep 02, 2017 PCP: Joaquim Nam, MD   Brief Narrative: Danielle LAUGHLIN is a 83 y.o. female with a medical history of insulin-dependent diabetes mellitus, hypertension, history of CVA, fibrillation on Eliquis, chronic diastolic heart failure, chronic kidney disease stage IV.  She presented with severe left wrist pain found to have a left wrist fracture.  Orthopedic surgery was consulted and opted for nonoperative management.  Patient developed oliguric AKI and found to have cirrhosis. Patient declining fairly quickly. Plan is for hospice if no improvement in 1-2 days.   Assessment & Plan:   Principal Problem:   Left wrist fracture, closed, initial encounter Active Problems:   DM, type 2, with renal complications    Mild CAD - 20-30% RCA in 2013   Depression   CKD (chronic kidney disease), stage IV (HCC)   Essential hypertension   Normocytic anemia   Atrial fibrillation - 07/11/15 by PPM interrogation, recurrent 10/19 after DCCV 10/14   Thrombocytopenia (HCC)   Chronic diastolic CHF (congestive heart failure) (HCC)   History of CVA (cerebrovascular accident)   Occult GI bleeding   HCAP (healthcare-associated pneumonia)   Closed fracture of left wrist   Fall   Palliative care by specialist   Goals of care, counseling/discussion   Left wrist fracture Closed fracture. Pain improved. -orthopedic surgery recommendations: non-operative management -continue Norco prn  HCAP Blood cultures negative to date. Sputum gram stain/culture not available. -continue cefepime  Acute encephalopathy Unsure if this is secondary to medication vs uremia.  -watch mental status today to see if any improvement. If not, likely secondary to uremia and will likely not improve in the setting of anuria. -hold oral medications while unable to take them safely  Diabetes mellitus, type 2 -discontinue Lantus -continue SSI  CAD 20-30%  RCA -continue atorvastatin  Depression -continue Wellbutrin  Acute kidney injury on CKD stage IV No significant signs of uremia. Baseline creatinine of 2.2-2.5. Urine output about 500 mL over the last 24 hours. This is likely secondary to hypovolemia in the setting of recurrent emesis, diuresis and decreased oral intake. Creatinine has reached nadir.  -nephrology consult -IV fluids  Essential hypertension -discontinued metoprolol secondary to hypotension  Normocytic anemia Stable. Chronic.  Atrial fibrillation Patient with PPM. -continue Eliquis -continue amiodarone  -discontinued Toprol secondary to hypotension -will not start IV anticoagulation while unable to take PO. -deactivate pacer if choosing comfort care  Thrombocytopenia Chronic. Stabilized. Unknown etiology. Chart review shows no workup. No spontaneous bleeding. Discussed with hematology and recommends to continue Eliquis at current dose. Likely contributed to be new diagnosis of cirrhosis. Spleen normal. -smear review pending -trend CBC  Chronic diastolic heart failure Last echo from 04/2017 significant for an EF of 60-65% with grade 1 diastolic dysfunction  History of CVA Stable -continue Eliquis  History of dysphagia Emesis Patient with an EGD back in 2016 significant for dysmotility. Dysphagia 3 diet recommended at that time. Now with symptoms of emesis with oral intake. Improved with Zofran. This appears to be contributing to her overall recent weight loss. Esophagram significant for esophageal dysmotility. Unsure if this is related to uremia since it has been present for the last few weeks and doubt secondary to narcotics for the same reason but appears to be worsened in current setting. Small bowel movement. No history of gastroparesis. -continue Zofran -continue Protonix -GI consult -will consider Reglan pending GI note today  Cirrhosis As seen on ultrasound. Likely  causing thrombocytopenia. Unknown  etiology. Hepatitis panel negative.  DVT prophylaxis: Eliquis Code Status: Full code Family Communication: Daughter and son-in-law at bedside Disposition Plan: Home hospice vs hospital death likely   Consultants:   Orthopedic surgery  Procedures:   None  Antimicrobials:  Vancomycin (10/28>>  Cefepime (10/28>>    Subjective: Somnolent today. No emesis.  Objective: Vitals:   08/13/17 1530 08/13/17 2107 08/14/17 0432 08/14/17 1300  BP: (!) 109/55 (!) 108/51 (!) 118/59 (!) 127/44  Pulse: 69 73 70 70  Resp: 16   16  Temp: (!) 97.5 F (36.4 C) 98.4 F (36.9 C) (!) 97.5 F (36.4 C) (!) 97.5 F (36.4 C)  TempSrc: Axillary Oral Oral Oral  SpO2: 100% 100% 100% 99%  Weight:      Height:       No intake or output data in the 24 hours ending 08/14/17 1445 Filed Weights   12/24/16 2110  Weight: 80.8 kg (178 lb 3.2 oz)    Examination:  General exam: Appears calm and comfortable. Appears fatigued Respiratory system: Respiratory effort normal. Central nervous system: Somnolent. No focal neurological deficits. Extremities: No edema. No calf tenderness. Left arm in cast Skin: No cyanosis. No rashes. Appears jaundiced. Psychiatry: Judgement and insight appear impaired.    Data Reviewed: I have personally reviewed following labs and imaging studies  CBC: Recent Labs  Lab 12/24/16 2231 08/08/17 0309 08/10/17 1125 08/12/17 0352 08/13/17 0612 08/14/17 0841  WBC 12.4* 9.2 13.9* 10.2 9.9 9.6  NEUTROABS 8.5* 6.2  --   --   --   --   HGB 9.1* 7.6* 9.8* 8.7* 8.5* 8.8*  HCT 28.6* 24.1* 30.3* 27.6* 26.9* 27.5*  MCV 95.7 94.1 94.7 94.8 95.7 94.5  PLT 135* 67* 67* 52* 45* 52*   Basic Metabolic Panel: Recent Labs  Lab 08/10/17 1852 08/11/17 0603 08/12/17 0352 08/13/17 0612 08/13/17 0834 08/14/17 0536  NA 136 138 136 134*  --  133*  K 4.2 4.6 3.9 4.6  --  4.0  CL 102 105 103 104  --  103  CO2 21* 16* 20* 18*  --  18*  GLUCOSE 112* 81 99 158*  --  138*  BUN 70*  75* 76* 74*  --  74*  CREATININE 3.94* 3.99* 4.22* 4.12*  --  4.29*  CALCIUM 8.4* 8.1* 8.2* 7.9*  --  8.1*  PHOS  --   --   --   --  5.2* 5.1*   GFR: Estimated Creatinine Clearance: 9.8 mL/min (A) (by C-G formula based on SCr of 4.29 mg/dL (H)). Liver Function Tests: Recent Labs  Lab 08/08/17 0043 08/08/17 0309 08/13/17 0612 08/14/17 0536  AST 88* 81* 73*  --   ALT 34 29 28  --   ALKPHOS 191* 158* 190*  --   BILITOT 3.0* 2.5* 2.3*  --   PROT 6.5 5.3* 5.8*  --   ALBUMIN 2.2* 1.9* 1.6* 1.6*   Recent Labs  Lab 08/13/17 0612  LIPASE 20   No results for input(s): AMMONIA in the last 168 hours. Coagulation Profile: No results for input(s): INR, PROTIME in the last 168 hours. Cardiac Enzymes: No results for input(s): CKTOTAL, CKMB, CKMBINDEX, TROPONINI in the last 168 hours. BNP (last 3 results) Recent Labs    09/20/16 1458 03/16/17 1441 04/11/17 1345  PROBNP 140.0* 287.0* 215.0*   HbA1C: No results for input(s): HGBA1C in the last 72 hours. CBG: Recent Labs  Lab 08/13/17 2115 08/14/17 0031 08/14/17 0434 08/14/17 0828 08/14/17  1227  GLUCAP 159* 152* 133* 127* 74   Lipid Profile: No results for input(s): CHOL, HDL, LDLCALC, TRIG, CHOLHDL, LDLDIRECT in the last 72 hours. Thyroid Function Tests: No results for input(s): TSH, T4TOTAL, FREET4, T3FREE, THYROIDAB in the last 72 hours. Anemia Panel: No results for input(s): VITAMINB12, FOLATE, FERRITIN, TIBC, IRON, RETICCTPCT in the last 72 hours. Sepsis Labs: Recent Labs  Lab 08/08/17 0058 08/08/17 0309 08/08/17 0721 08/09/17 0552 08/09/17 1846  PROCALCITON  --  0.22  --  0.26  --   LATICACIDVEN 2.53* 2.0* 2.7*  --  2.1*    Recent Results (from the past 240 hour(s))  Blood culture (routine x 2)     Status: None   Collection Time: 08/08/17 12:43 AM  Result Value Ref Range Status   Specimen Description BLOOD RIGHT ARM  Final   Special Requests   Final    BOTTLES DRAWN AEROBIC AND ANAEROBIC Blood Culture  adequate volume   Culture NO GROWTH 5 DAYS  Final   Report Status 08/13/2017 FINAL  Final  Blood culture (routine x 2)     Status: None   Collection Time: 08/08/17  1:44 AM  Result Value Ref Range Status   Specimen Description BLOOD RIGHT HAND  Final   Special Requests   Final    BOTTLES DRAWN AEROBIC ONLY Blood Culture adequate volume   Culture NO GROWTH 5 DAYS  Final   Report Status 08/13/2017 FINAL  Final  MRSA PCR Screening     Status: None   Collection Time: 08/11/17 11:48 AM  Result Value Ref Range Status   MRSA by PCR NEGATIVE NEGATIVE Final    Comment:        The GeneXpert MRSA Assay (FDA approved for NASAL specimens only), is one component of a comprehensive MRSA colonization surveillance program. It is not intended to diagnose MRSA infection nor to guide or monitor treatment for MRSA infections.          Radiology Studies: US Abdomen Limited  Result Date: 08/13/2017 CLINICAL DATA:  83 year old female with limited left upper quadrant ultrasound to evaluate for splenomegaly. EXAM: ULTRASOUND ABDOMEN LIMITED COMPARISON:  None. FINDINGS: The spleen appears unremarkable and demonstrates normal echotexture. It measures 8.1 x 10.4 x 5.2 cm. Overall splenic volume is 230 cc. This is within normal limits. No focal masses are identified. Incidental note is made of a left pleural effusion. IMPRESSION: 1. Normal size and appearance of the spleen. 2. Incidental left pleural effusion. Electronically Signed   By: Sande Brothers M.D.   On: 08/13/2017 13:10   US Abdomen Limited  Result Date: 08/12/2017 CLINICAL DATA:  Elevated LFTs.  Nausea and vomiting.  Ascites. EXAM: ULTRASOUND ABDOMEN LIMITED RIGHT UPPER QUADRANT COMPARISON:  None. FINDINGS: Gallbladder: Gallbladder is physiologically distended. No gallstones or wall thickening visualized. No sonographic Murphy sign noted by sonographer. Common bile duct: Diameter: 2 mm, normal. Liver: Shrunken with nodular contours. There is  increased coarsened echogenicity. No focal lesion demonstrated sonographically. Portal vein is patent on color Doppler imaging with normal direction of blood flow towards the liver. Intra-abdominal ascites greatest in the right abdomen, small to moderate in degree. IMPRESSION: 1. Cirrhotic hepatic morphology. No focal lesion demonstrated sonographically. 2. Normal sonographic appearance of the gallbladder and biliary tree. 3. Ascites. Electronically Signed   By: Rubye Oaks M.D.   On: 08/12/2017 21:41        Scheduled Meds: . allopurinol  200 mg Oral Daily  . amiodarone  200 mg Oral Daily  .  apixaban  2.5 mg Oral BID  . atorvastatin  20 mg Oral q1800  . buPROPion  75 mg Oral BID  . insulin aspart  0-9 Units Subcutaneous Q4H  . insulin glargine  10 Units Subcutaneous QHS  . loratadine  5 mg Oral QPM  . ondansetron  4 mg Intravenous Q8H  . pantoprazole  40 mg Oral Daily  . polyethylene glycol  17 g Oral Daily  . senna-docusate  0.5 tablet Oral Daily  . sodium chloride flush  10-40 mL Intracatheter Q12H  . sodium chloride flush  3 mL Intravenous Q12H  . sodium chloride flush  3 mL Intravenous Q12H   Continuous Infusions: . sodium chloride    . ceFEPime (MAXIPIME) IV Stopped (08/14/17 0057)  . dextrose 5 % and 0.45% NaCl 75 mL/hr at 08/13/17 1401     LOS: 6 days     Jacquelin Hawking, MD [facility] 08/14/2017, 2:45 PM Pager: [phone]  If 7PM-7AM, please contact night-coverage [email or web address] Password [email or web address] 08/14/2017, 2:45 PM

## 2017-08-14 NOTE — Progress Notes (Signed)
Daily Progress Note   Patient Name: Danielle Benitez       Date: 08/14/2017 DOB: Jan 18, 1932  Age: 83 y.o. MRN#: [id number] Attending Physician: Narda Bonds, MD Primary Care Physician: Joaquim Nam, MD Admit Date: 08-19-17  Reason for Consultation/Follow-up: Establishing goals of care  Subjective:  patient appears more weak, she is pale, has a lot of 3rd spacing, weeping/oozing fluid RUE Now has a mid line IV inserted Was started on D5 for low blood sugars Does not open eyes Does not follow commands, does not verbalize with me Son daughter in law, daughter and son in law are at the bedside See below:   Length of Stay: 6  Current Medications: Scheduled Meds:  . allopurinol  200 mg Oral Daily  . amiodarone  200 mg Oral Daily  . apixaban  2.5 mg Oral BID  . atorvastatin  20 mg Oral q1800  . buPROPion  75 mg Oral BID  . insulin aspart  0-9 Units Subcutaneous Q4H  . insulin glargine  10 Units Subcutaneous QHS  . loratadine  5 mg Oral QPM  . ondansetron  4 mg Intravenous Q8H  . pantoprazole  40 mg Oral Daily  . polyethylene glycol  17 g Oral Daily  . senna-docusate  0.5 tablet Oral Daily  . sodium chloride flush  10-40 mL Intracatheter Q12H  . sodium chloride flush  3 mL Intravenous Q12H  . sodium chloride flush  3 mL Intravenous Q12H    Continuous Infusions: . sodium chloride    . ceFEPime (MAXIPIME) IV Stopped (08/14/17 0057)  . dextrose 5 % and 0.45% NaCl 75 mL/hr at 08/13/17 1401    PRN Meds: sodium chloride, acetaminophen **OR** acetaminophen, bisacodyl, fentaNYL (SUBLIMAZE) injection, fluticasone, HYDROcodone-acetaminophen, ondansetron (ZOFRAN) IV, senna-docusate, simethicone, sodium chloride flush, sodium chloride flush, sodium chloride flush, traZODone  Physical  Exam         Weak pale elderly lady S1 S2 Shallow clear breath sounds, diminished bases Abdomen distended Has 3+edema and bruises scattered both UE and LE Some coolness no mottling of LE also evident  Vital Signs: BP (!) 127/44   Pulse 70   Temp (!) 97.5 F (36.4 C) (Oral)   Resp 16   Ht 5\' 2"  (1.575 m)   Wt 80.8 kg (178 lb 3.2 oz)   SpO2  99%   BMI 32.59 kg/m  SpO2: SpO2: 99 % O2 Device: O2 Device: Not Delivered O2 Flow Rate:    Intake/output summary: No intake or output data in the 24 hours ending 08/14/17 1441 LBM: Last BM Date: 07/23/2017 Baseline Weight: Weight: 80.8 kg (178 lb 3.2 oz) Most recent weight: Weight: 80.8 kg (178 lb 3.2 oz)      PPS 20% Palliative Assessment/Data:    Flowsheet Rows     Most Recent Value  Intake Tab  Referral Department  Hospitalist  Unit at Time of Referral  Med/Surg Unit  Palliative Care Primary Diagnosis  Nephrology  Palliative Care Type  New Palliative care  Reason for referral  Clarify Goals of Care, Counsel Regarding Hospice  Date first seen by Palliative Care  08/13/17  Clinical Assessment  Palliative Performance Scale Score  30%  Pain Max last 24 hours  4  Pain Min Last 24 hours  3  Dyspnea Max Last 24 Hours  3  Dyspnea Min Last 24 hours  2  Nausea Max Last 24 Hours  3  Nausea Min Last 24 Hours  2  Anxiety Max Last 24 Hours  3  Anxiety Min Last 24 Hours  2  Psychosocial & Spiritual Assessment  Palliative Care Outcomes  Patient/Family meeting held?  Yes  Who was at the meeting?  patient daughter son in law.   Palliative Care Outcomes  Clarified goals of care      Patient Active Problem List   Diagnosis Date Noted  . Palliative care by specialist   . Goals of care, counseling/discussion   . Fall 08/11/2017  . Occult GI bleeding 08/08/2017  . Left wrist fracture, closed, initial encounter 08/08/2017  . HCAP (healthcare-associated pneumonia) 08/08/2017  . Closed fracture of left wrist   . Leg weakness 08/04/2017    . Muscular deconditioning 08/04/2017  . Upper respiratory tract infection 07/07/2017  . Confusion 06/03/2017  . History of CVA (cerebrovascular accident)   . Hyponatremia   . Stage 3 chronic kidney disease (HCC)   . AKI (acute kidney injury) (HCC)   . Acute blood loss anemia   . UTI (urinary tract infection) 04/28/2017  . Acute metabolic encephalopathy 04/28/2017  . Acute lower UTI 04/28/2017  . Diabetes mellitus with diabetic nephropathy (HCC) 04/28/2017  . Dyslipidemia associated with type 2 diabetes mellitus (HCC) 04/28/2017  . Chronic diastolic CHF (congestive heart failure) (HCC) 04/28/2017  . Altered mental status   . TIA (transient ischemic attack) 03/31/2017  . Thrombocytopenia (HCC) 03/31/2017  . Chest pain 02/14/2017  . Dysuria 09/21/2016  . At high risk for falls 06/25/2016  . Skin tear of elbow without complication 06/24/2016  . Injury of left shoulder and upper arm 06/24/2016  . Unequal blood pressure in upper extremities 06/03/2016  . Cardiac pacemaker in situ 04/12/2016  . Cough 03/08/2016  . Risk for falls 03/05/2016  . Insomnia 01/21/2016  . Chronic systolic congestive heart failure, NYHA class 3 (HCC) 01/20/2016  . LFT elevation 11/21/2015  . Weakness 11/06/2015  . Dysphagia   . Esophageal dysmotility 08/05/2015  . Anticoagulated- Eliquis 07/31/2015  . Normochromic normocytic anemia 07/31/2015  . Normocytic anemia 07/18/2015  . Atrial fibrillation - 07/11/15 by PPM interrogation, recurrent 10/19 after DCCV 10/14 07/18/2015  . Moderate mitral regurgitation 07/18/2015  . Moderate tricuspid regurgitation 07/18/2015  . History of pacemaker - Medtronic 07/18/2015  . CKD (chronic kidney disease), stage IV (HCC)   . Orthostatic hypotension   . Essential hypertension   .  Pericardial effusion 07/15/2015  . Female stress incontinence 05/06/2015  . Knee pain 12/18/2014  . Low back pain 02/27/2014  . Obese 02/27/2014  . Depression 02/27/2014  . Pain in joint,  shoulder region 11/22/2013  . Eczema 11/22/2013  . S/P cardiac pacemaker procedure, PPM Medtronic REVO placed 01/11/2012 01/11/2012  . COPD bronchitis by CXR 01/10/2012  . Dyslipidemia 01/10/2012  . Mild CAD - 20-30% RCA in 2013 01/10/2012  . Fatigue 01/10/2012  . Sinus bradycardia, HR as low As 35 01/10/2012  . Mild aortic stenosis 01/10/2012  . Chest wall pain 01/08/2012  . SOB (shortness of breath) 01/08/2012  . DM, type 2, with renal complications  01/08/2012    Palliative Care Assessment & Plan   Patient Profile:    Assessment:  AKI, IV CKD, possible nephrosclerosis Nausea, with vomiting earlier in this hospitalization Low blood sugars, DM HTN, CVA history, A fib history.  Recent fall, fracture L wrist.   Recommendations/Plan:    Transitioning towards comfort measures, son wishes to continue current mode of care for now.   Add low dose Fentanyl IV PRN for pain or dyspnea  Anticipated hospital death, prognosis could be as short as few hours to few days, frankly and compassionately discussed with son and daughter.   Continue current mode of care.   Code Status:    Code Status Orders  (From admission, onward)        Start     Ordered   08/13/17 1139  Do not attempt resuscitation (DNR)  Continuous    Question Answer Comment  In the event of cardiac or respiratory ARREST Do not call a "code blue"   In the event of cardiac or respiratory ARREST Do not perform Intubation, CPR, defibrillation or ACLS   In the event of cardiac or respiratory ARREST Use medication by any route, position, wound care, and other measures to relive pain and suffering. May use oxygen, suction and manual treatment of airway obstruction as needed for comfort.      08/13/17 1138    Code Status History    Date Active Date Inactive Code Status Order ID Comments User Context   08/08/2017 02:54 08/13/2017 11:38 Full Code [id number]  Briscoe Deutscher, MD ED   08/08/2017 02:54 08/08/2017 02:54 Full  Code [id number]  Briscoe Deutscher, MD ED   07/18/2017 11:41 07/18/2017 18:19 Full Code [id number]  Laurey Morale, MD Inpatient   07/07/2017 12:37 07/09/2017 17:52 Full Code [id number]  Marcos Eke, PA-C ED   06/03/2017 17:08 06/04/2017 19:32 Full Code [id number]  Vassie Loll, MD ED   04/28/2017 20:46 05/02/2017 16:33 Full Code [id number]  Alison Murray, MD Inpatient   04/02/2017 12:17 04/05/2017 21:34 Partial Code [id number]  Laurey Morale, MD Inpatient   04/01/2017 00:01 04/02/2017 12:17 Partial Code [id number]  Alberteen Sam, MD Inpatient   02/14/2017 22:13 02/16/2017 21:13 Full Code [id number]  Therisa Doyne, MD Inpatient   04/12/2016 11:30 04/16/2016 19:58 Full Code [id number]  Marinus Maw, MD Inpatient   08/05/2015 00:41 08/11/2015 19:15 Full Code [id number]  Laurey Morale, MD Inpatient   07/30/2015 17:44 08/03/2015 17:25 Full Code [id number]  Ellsworth Lennox, PA Inpatient   07/18/2015 16:50 07/28/2015 20:50 Full Code [id number]  Laurann Montana, PA-C Inpatient   04/09/2014 15:50 04/10/2014 10:24 Full Code [id number]  Glendora Score Inpatient   01/11/2012 11:32 01/12/2012 19:49 Full Code [id number]  Toniann Ket, RN Inpatient   01/08/2012 21:05 01/11/2012 11:32 Full  Code [id number]  Alonza Bogusuvall, Deborah Gray, RN ED    Advance Directive Documentation     Most Recent Value  Type of Advance Directive  Healthcare Power of Attorney  Pre-existing out of facility DNR order (yellow form or pink MOST form)  No data  "MOST" Form in Place?  No data       Prognosis:   Hours - Days  Discharge Planning:  Anticipated Hospital Death  Care plan was discussed with  Patient's family listed above.   Thank you for allowing the Palliative Medicine Team to assist in the care of this patient.   Time In: 1400 Time Out: 1435 Total Time 35 Prolonged Time Billed  no       Greater than 50%  of this time was spent counseling and coordinating care related to the above assessment and plan.  Rosalin HawkingZeba Kristiana Jacko,  MD [phone]  Please contact Palliative Medicine Team phone at [phone] for questions and concerns.

## 2017-08-14 NOTE — Progress Notes (Signed)
  Chart reviewed. Patient continues to decline and is progressing to comfort care.  We will sign off. Please call if we can help.   Arvilla Meres, MD  3:17 PM

## 2017-08-14 NOTE — Progress Notes (Signed)
Chart briefly reviewed.  Still with marginal UOP.    She is not a dialysis candidate.  Cr not improving.  I anticipate that if the trajectory does not change she will expire fairly soon.   I appreciate very much palliative care involvement.    I will sign off.  Please don't hesitate to contact with questions.    Bufford Buttner MD [facility] pgr [phone]

## 2017-08-14 NOTE — Plan of Care (Signed)
  Physical Regulation: Ability to maintain clinical measurements within normal limits will improve 08/14/2017 1547 - Progressing by Darrow Bussing, RN Will remain free from infection 08/14/2017 1547 - Progressing by Darrow Bussing, RN   Bowel/Gastric: Will not experience complications related to bowel motility 08/14/2017 1547 - Progressing by Darrow Bussing, RN

## 2017-08-15 LAB — GLUCOSE, CAPILLARY
GLUCOSE-CAPILLARY: 88 mg/dL (ref 65–99)
Glucose-Capillary: 108 mg/dL — ABNORMAL HIGH (ref 65–99)
Glucose-Capillary: 81 mg/dL (ref 65–99)

## 2017-08-15 LAB — CBC
HEMATOCRIT: 28.2 % — AB (ref 36.0–46.0)
HEMOGLOBIN: 9.1 g/dL — AB (ref 12.0–15.0)
MCH: 30.7 pg (ref 26.0–34.0)
MCHC: 32.3 g/dL (ref 30.0–36.0)
MCV: 95.3 fL (ref 78.0–100.0)
Platelets: 55 10*3/uL — ABNORMAL LOW (ref 150–400)
RBC: 2.96 MIL/uL — ABNORMAL LOW (ref 3.87–5.11)
RDW: 22.1 % — ABNORMAL HIGH (ref 11.5–15.5)
WBC: 10.2 10*3/uL (ref 4.0–10.5)

## 2017-08-15 LAB — RENAL FUNCTION PANEL
ALBUMIN: 1.6 g/dL — AB (ref 3.5–5.0)
ANION GAP: 11 (ref 5–15)
BUN: 80 mg/dL — ABNORMAL HIGH (ref 6–20)
CHLORIDE: 104 mmol/L (ref 101–111)
CO2: 19 mmol/L — AB (ref 22–32)
Calcium: 8.2 mg/dL — ABNORMAL LOW (ref 8.9–10.3)
Creatinine, Ser: 4.4 mg/dL — ABNORMAL HIGH (ref 0.44–1.00)
GFR calc Af Amer: 10 mL/min — ABNORMAL LOW (ref >60)
GFR calc non Af Amer: 8 mL/min — ABNORMAL LOW (ref >60)
GLUCOSE: 117 mg/dL — AB (ref 65–99)
PHOSPHORUS: 5.2 mg/dL — AB (ref 2.5–4.6)
POTASSIUM: 4 mmol/L (ref 3.5–5.1)
Sodium: 134 mmol/L — ABNORMAL LOW (ref 135–145)

## 2017-08-15 MED ORDER — ONDANSETRON HCL 4 MG/2ML IJ SOLN
4.0000 mg | Freq: Three times a day (TID) | INTRAMUSCULAR | Status: DC
  Administered 2017-08-15 – 2017-08-21 (×16): 4 mg via INTRAVENOUS
  Filled 2017-08-15 (×16): qty 2

## 2017-08-15 MED ORDER — DEXTROSE-NACL 5-0.45 % IV SOLN: INTRAVENOUS | Status: DC

## 2017-08-15 MED ORDER — MORPHINE SULFATE (PF) 4 MG/ML IV SOLN
1.0000 mg | INTRAVENOUS | Status: DC | PRN
  Administered 2017-08-17 – 2017-08-20 (×4): 1 mg via INTRAVENOUS
  Filled 2017-08-15 (×4): qty 1

## 2017-08-15 MED ORDER — WHITE PETROLATUM EX OINT
TOPICAL_OINTMENT | CUTANEOUS | Status: AC
  Administered 2017-08-15: 12:00:00
  Filled 2017-08-15: qty 28.35

## 2017-08-15 MED ORDER — LORAZEPAM 2 MG/ML IJ SOLN: 1.0000 mg | INTRAMUSCULAR | Status: DC | PRN

## 2017-08-15 MED ORDER — ORAL CARE MOUTH RINSE
15.0000 mL | Freq: Two times a day (BID) | OROMUCOSAL | Status: DC
  Administered 2017-08-15 – 2017-08-20 (×11): 15 mL via OROMUCOSAL

## 2017-08-15 MED ORDER — METOCLOPRAMIDE HCL 5 MG/ML IJ SOLN
5.0000 mg | Freq: Two times a day (BID) | INTRAMUSCULAR | Status: DC
  Administered 2017-08-15 – 2017-08-20 (×10): 5 mg via INTRAVENOUS
  Filled 2017-08-15 (×10): qty 2

## 2017-08-15 NOTE — Progress Notes (Signed)
Physical Therapy Discharge Patient Details Name: Danielle Benitez MRN: [id number] DOB: [birth year] Today's Date: 08/15/2017 Time:  -     Patient discharged from PT services secondary to medical decline - will need to re-order PT to resume therapy services.  Please see latest therapy progress note for current level of functioning and progress toward goals.    Progress and discharge plan discussed with patient and/or caregiver: Patient/Caregiver agrees with plan. Patient on comfort care.   GP     Derek Mound, PTA Pager: [phone]   08/15/2017, 11:33 AM

## 2017-08-15 NOTE — Progress Notes (Signed)
Patient continues with not wanting to eat or take any pills by mouth. Dr. Mal Misty and palliative care aware per day shift nurse and per both physicians progress reports. After mulitple attempts and after routine mouth care, I was still unable to get patient to take PO eliquis. Patient would shut her mouth tight and shook head no when asking if she wanted to take it. Informed charge nurse and will inform on coming nurse to let day shift provider know about possible replacement for PO eliquis related to patients change in condition and family's concerns about patient not being able to take the medication.

## 2017-08-15 NOTE — Progress Notes (Signed)
Pt just vomitted large amount of tan emesis, noted corn in emesis.It has been several days since pt ate.  Family upset, reassured, pt cleaned, suctioned minimal out of mouth. Pt was turned on her side when she started vomitting, most all rolled out of mouth.  Pt given zofran IV.  Family at bedside.  Will notify Dr Caleb Popp.

## 2017-08-15 NOTE — Progress Notes (Signed)
Daily Progress Note   Patient Name: Danielle Benitez       Date: 08/15/2017 DOB: 08/26/1932  Age: 83 y.o. MRN#: [id number] Attending Physician: Narda Bonds, MD Primary Care Physician: Joaquim Nam, MD Admit Date: 08/03/2017  Reason for Consultation/Follow-up: Establishing goals of care  Subjective:  Patient appears unresponsive at this point, she does not appear to be in distress, at time, she is having apneic spells, now on comfort measures, see below, family holsing vigil at the bedside  Son daughter in law, daughter and son in law are at the bedside See below:   Length of Stay: 7  Current Medications: Scheduled Meds:  . mouth rinse  15 mL Mouth Rinse BID  . sodium chloride flush  10-40 mL Intracatheter Q12H  . sodium chloride flush  3 mL Intravenous Q12H  . sodium chloride flush  3 mL Intravenous Q12H    Continuous Infusions: . sodium chloride    . dextrose 5 % and 0.45% NaCl 20 mL/hr (08/15/17 1045)    PRN Meds: sodium chloride, bisacodyl, fentaNYL (SUBLIMAZE) injection, LORazepam, morphine injection, ondansetron (ZOFRAN) IV, sodium chloride flush, sodium chloride flush, sodium chloride flush  Physical Exam         Weak pale elderly lady S1 S2 Shallow clear breath sounds, diminished bases Abdomen distended Has 3+edema and bruises scattered both UE and LE Some coolness no mottling of LE also evident Essentially unresponsive at this point, does not appear to be in distress  Vital Signs: BP (!) 134/56 (BP Location: Left Leg)   Pulse 70   Temp 97.8 F (36.6 C) (Axillary)   Resp 16   Ht 5\' 2"  (1.575 m)   Wt 80.8 kg (178 lb 3.2 oz)   SpO2 99%   BMI 32.59 kg/m  SpO2: SpO2: 99 % O2 Device: O2 Device: Not Delivered O2 Flow Rate:    Intake/output summary: No intake  or output data in the 24 hours ending 08/15/17 1058 LBM: Last BM Date: 07/27/2017 Baseline Weight: Weight: 80.8 kg (178 lb 3.2 oz) Most recent weight: Weight: 80.8 kg (178 lb 3.2 oz)      PPS 10% Palliative Assessment/Data:    Flowsheet Rows     Most Recent Value  Intake Tab  Referral Department  Hospitalist  Unit at Time of Referral  Med/Surg  Unit  Palliative Care Primary Diagnosis  Nephrology  Palliative Care Type  New Palliative care  Reason for referral  Clarify Goals of Care, Counsel Regarding Hospice  Date first seen by Palliative Care  08/13/17  Clinical Assessment  Palliative Performance Scale Score  30%  Pain Max last 24 hours  4  Pain Min Last 24 hours  3  Dyspnea Max Last 24 Hours  3  Dyspnea Min Last 24 hours  2  Nausea Max Last 24 Hours  3  Nausea Min Last 24 Hours  2  Anxiety Max Last 24 Hours  3  Anxiety Min Last 24 Hours  2  Psychosocial & Spiritual Assessment  Palliative Care Outcomes  Patient/Family meeting held?  Yes  Who was at the meeting?  patient daughter son in law.   Palliative Care Outcomes  Clarified goals of care      Patient Active Problem List   Diagnosis Date Noted  . Palliative care by specialist   . Goals of care, counseling/discussion   . Fall 08/11/2017  . Occult GI bleeding 08/08/2017  . Left wrist fracture, closed, initial encounter 08/08/2017  . HCAP (healthcare-associated pneumonia) 08/08/2017  . Closed fracture of left wrist   . Leg weakness 08/04/2017  . Muscular deconditioning 08/04/2017  . Upper respiratory tract infection 07/07/2017  . Confusion 06/03/2017  . History of CVA (cerebrovascular accident)   . Hyponatremia   . Stage 3 chronic kidney disease (HCC)   . AKI (acute kidney injury) (HCC)   . Acute blood loss anemia   . UTI (urinary tract infection) 04/28/2017  . Acute metabolic encephalopathy 04/28/2017  . Acute lower UTI 04/28/2017  . Diabetes mellitus with diabetic nephropathy (HCC) 04/28/2017  . Dyslipidemia  associated with type 2 diabetes mellitus (HCC) 04/28/2017  . Chronic diastolic CHF (congestive heart failure) (HCC) 04/28/2017  . Altered mental status   . TIA (transient ischemic attack) 03/31/2017  . Thrombocytopenia (HCC) 03/31/2017  . Chest pain 02/14/2017  . Dysuria 09/21/2016  . At high risk for falls 06/25/2016  . Skin tear of elbow without complication 06/24/2016  . Injury of left shoulder and upper arm 06/24/2016  . Unequal blood pressure in upper extremities 06/03/2016  . Cardiac pacemaker in situ 04/12/2016  . Cough 03/08/2016  . Risk for falls 03/05/2016  . Insomnia 01/21/2016  . Chronic systolic congestive heart failure, NYHA class 3 (HCC) 01/20/2016  . LFT elevation 11/21/2015  . Weakness 11/06/2015  . Dysphagia   . Esophageal dysmotility 08/05/2015  . Anticoagulated- Eliquis 07/31/2015  . Normochromic normocytic anemia 07/31/2015  . Normocytic anemia 07/18/2015  . Atrial fibrillation - 07/11/15 by PPM interrogation, recurrent 10/19 after DCCV 10/14 07/18/2015  . Moderate mitral regurgitation 07/18/2015  . Moderate tricuspid regurgitation 07/18/2015  . History of pacemaker - [employer]tronic 07/18/2015  . CKD (chronic kidney disease), stage IV (HCC)   . Orthostatic hypotension   . Essential hypertension   . Pericardial effusion 07/15/2015  . Female stress incontinence 05/06/2015  . Knee pain 12/18/2014  . Low back pain 02/27/2014  . Obese 02/27/2014  . Depression 02/27/2014  . Pain in joint, shoulder region 11/22/2013  . Eczema 11/22/2013  . S/P cardiac pacemaker procedure, PPM [employer]onic REVO placed 01/11/2012 01/11/2012  . COPD bronchitis by CXR 01/10/2012  . Dyslipidemia 01/10/2012  . Mild CAD - 20-30% RCA in 2013 01/10/2012  . Fatigue 01/10/2012  . Sinus bradycardia, HR as low As 35 01/10/2012  . Mild aortic stenosis 01/10/2012  . Chest wall  pain 01/08/2012  . SOB (shortness of breath) 01/08/2012  . DM, type 2, with renal complications  01/08/2012     Palliative Care Assessment & Plan   Patient Profile:    Assessment:  AKI, IV CKD, possible nephrosclerosis Nausea, with vomiting earlier in this hospitalization Low blood sugars, DM HTN, CVA history, A fib history.  Recent fall, fracture L wrist.  Now actively dying   Recommendations/Plan:    Transitioning towards comfort measures,     low dose Fentanyl IV PRN for pain or dyspnea  Anticipated hospital death, prognosis could be as short as few hours to few days, frankly and compassionately discussed with son and daughter.   Add low dose IV Ativan PRN for comfort  Comfort cart for family  Chaplain consult for offering family additional support at this time  Discussed with family about no need for PPM de activation at end of life usually, continue current comfort measures.   Discussed with family about apneic spells at end of life, skin changes of coolness/mottling at end of life, fluid shifts etc.   Code Status:    Code Status Orders  (From admission, onward)        Start     Ordered   08/13/17 1139  Do not attempt resuscitation (DNR)  Continuous    Question Answer Comment  In the event of cardiac or respiratory ARREST Do not call a "code blue"   In the event of cardiac or respiratory ARREST Do not perform Intubation, CPR, defibrillation or ACLS   In the event of cardiac or respiratory ARREST Use medication by any route, position, wound care, and other measures to relive pain and suffering. May use oxygen, suction and manual treatment of airway obstruction as needed for comfort.      08/13/17 1138    Code Status History    Date Active Date Inactive Code Status Order ID Comments User Context   08/08/2017 02:54 08/13/2017 11:38 Full Code [id number]  Briscoe Deutscher, MD ED   08/08/2017 02:54 08/08/2017 02:54 Full Code [id number]  Briscoe Deutscher, MD ED   07/18/2017 11:41 07/18/2017 18:19 Full Code [id number]  Laurey Morale, MD Inpatient   07/07/2017 12:37 07/09/2017  17:52 Full Code [id number]  Marcos Eke, PA-C ED   06/03/2017 17:08 06/04/2017 19:32 Full Code [id number]  Vassie Loll, MD ED   04/28/2017 20:46 05/02/2017 16:33 Full Code [id number]  Alison Murray, MD Inpatient   04/02/2017 12:17 04/05/2017 21:34 Partial Code [id number]  Laurey Morale, MD Inpatient   04/01/2017 00:01 04/02/2017 12:17 Partial Code [id number]  Alberteen Sam, MD Inpatient   02/14/2017 22:13 02/16/2017 21:13 Full Code [id number]  Therisa Doyne, MD Inpatient   04/12/2016 11:30 04/16/2016 19:58 Full Code [id number]  Marinus Maw, MD Inpatient   08/05/2015 00:41 08/11/2015 19:15 Full Code [id number]  Laurey Morale, MD Inpatient   07/30/2015 17:44 08/03/2015 17:25 Full Code [id number]  Ellsworth Lennox, PA Inpatient   07/18/2015 16:50 07/28/2015 20:50 Full Code [id number]  Laurann Montana, PA-C Inpatient   04/09/2014 15:50 04/10/2014 10:24 Full Code [id number]  Glendora Score Inpatient   01/11/2012 11:32 01/12/2012 19:49 Full Code [id number]  Toniann Ket, RN Inpatient   01/08/2012 21:05 01/11/2012 11:32 Full Code [id number]  Alonza Bogus, RN ED    Advance Directive Documentation     Most Recent Value  Type of Advance Directive  Healthcare Power of Attorney  Pre-existing out of facility DNR order (yellow form  or pink MOST form)  No data  "MOST" Form in Place?  No data       Prognosis:   Hours - Days  Discharge Planning:  Anticipated Hospital Death  Care plan was discussed with  Patient's family listed above.   Thank you for allowing the Palliative Medicine Team to assist in the care of this patient.   Time In: 10 Time Out: 10.35 Total Time 35 Prolonged Time Billed  no       Greater than 50%  of this time was spent counseling and coordinating care related to the above assessment and plan.  Rosalin Hawking, MD [phone]  Please contact Palliative Medicine Team phone at [phone] for questions and concerns.

## 2017-08-15 NOTE — Progress Notes (Signed)
Patients evening blood sugar was 54. Patient was in no acute distress and had no s/s of hypoglycemia. Gave patient dextrose 5% 50 ml per hypoglycemic protocol as well as rechecked later with blood sugar then noted to be 125. Night on call md paged about new events and verbal instruction to recheck then blood sugar in 1 hr and if becomes hypoglycemic again to put her on d5 with 1/2 NS at a rate of 50 ml/hr. Rechecked blood sugar per verbal order and noted to be at 107. Patient continues to have stable blood sugars at this time. Will continue with q4 cbs and monitoring for any changes.

## 2017-08-15 NOTE — Progress Notes (Signed)
PROGRESS NOTE    Danielle Benitez  ZOX:[id number] DOB: [birth year] DOA: 08/09/2017 PCP: Joaquim Nam, MD   Brief Narrative: Danielle Benitez is a 83 y.o. female with a medical history of insulin-dependent diabetes mellitus, hypertension, history of CVA, fibrillation on Eliquis, chronic diastolic heart failure, chronic kidney disease stage IV.  She presented with severe left wrist pain found to have a left wrist fracture.  Orthopedic surgery was consulted and opted for nonoperative management.  Patient developed oliguric AKI and found to have cirrhosis. Patient declining fairly quickly. Plan is for hospice if no improvement in 1-2 days.   Assessment & Plan:   Principal Problem:   Left wrist fracture, closed, initial encounter Active Problems:   DM, type 2, with renal complications    Mild CAD - 20-30% RCA in 2013   Depression   CKD (chronic kidney disease), stage IV (HCC)   Essential hypertension   Normocytic anemia   Atrial fibrillation - 07/11/15 by PPM interrogation, recurrent 10/19 after DCCV 10/14   Thrombocytopenia (HCC)   Chronic diastolic CHF (congestive heart failure) (HCC)   History of CVA (cerebrovascular accident)   Occult GI bleeding   HCAP (healthcare-associated pneumonia)   Closed fracture of left wrist   Fall   Palliative care by specialist   Goals of care, counseling/discussion   Left wrist fracture Closed fracture. Pain improved. Comfort care.  HCAP Blood cultures negative to date. Sputum gram stain/culture not available. -discontinue cefepime -Comfort care  Acute metabolic encephalopathy Appears secondary to uremia. Will likely not improve. -Comfort care  Diabetes mellitus, type 2 -discontinue SSI  CAD 20-30% RCA -discontinue atorvastatin -Comfort care  Depression -discontinue Wellbutrin -Comfort care  Acute kidney injury on CKD stage IV Worsening signs of uremia with mental status changes. Nephrology signed off. Patient transitioned to comfort  care.  Essential hypertension -discontinued metoprolol secondary to hypotension  Normocytic anemia Stable. Chronic.  Atrial fibrillation Patient with PPM. Per notes ICD vs PPM. Appears to most likely be PPM but will check with cardiology. -discontinue eliquis and amiodarone -discontinue telemetry -continue Eliquis -continue amiodarone  -discontinued Toprol secondary to hypotension -will not start IV anticoagulation while unable to take PO.  Thrombocytopenia Chronic. Stabilized. Unknown etiology. Chart review shows no workup. No spontaneous bleeding. Discussed with hematology and recommends to continue Eliquis at current dose. Likely contributed to be new diagnosis of cirrhosis and worsened likely by iatrogenic means. Spleen normal. Comfort measures  Chronic diastolic heart failure Last echo from 04/2017 significant for an EF of 60-65% with grade 1 diastolic dysfunction  History of CVA Stable -discontinue Eliquis secondary to comfort measures  History of dysphagia Emesis Patient with an EGD back in 2016 significant for dysmotility. Dysphagia 3 diet recommended at that time. Now with symptoms of emesis with oral intake. Improved with Zofran. This appears to be contributing to her overall recent weight loss. Esophagram significant for esophageal dysmotility. Unsure if this is related to uremia since it has been present for the last few weeks and doubt secondary to narcotics for the same reason but appears to be worsened in current setting. Small bowel movement. No history of gastroparesis. Now, symptoms resolved.   Cirrhosis As seen on ultrasound. Likely causing thrombocytopenia. Unknown etiology. Hepatitis panel negative.  DVT prophylaxis: Eliquis Code Status: Full code Family Communication: Daughter and son-in-law at bedside Disposition Plan: Home hospice vs hospital death likely   Consultants:   Orthopedic surgery  Procedures:   None  Antimicrobials:  Vancomycin  (10/28>>  Cefepime (  10/28>>    Subjective: Lethargic  Objective: Vitals:   08/14/17 0432 08/14/17 1300 08/14/17 2113 08/15/17 0402  BP: (!) 118/59 (!) 127/44 (!) 143/45 (!) 134/56  Pulse: 70 70 69 70  Resp:  16    Temp: (!) 97.5 F (36.4 C) (!) 97.5 F (36.4 C) 97.6 F (36.4 C) 97.8 F (36.6 C)  TempSrc: Oral Oral Axillary Axillary  SpO2: 100% 99% 98% 99%  Weight:      Height:       No intake or output data in the 24 hours ending 08/15/17 1043 Filed Weights   August 11, 2017 2110  Weight: 80.8 kg (178 lb 3.2 oz)    Examination:  General exam: Appears calm and comfortable. Respiratory system: Respiratory effort normal. Central nervous system: Lethargic. Extremities: No edema. No calf tenderness. Left arm in cast Skin: No cyanosis. No rashes. Appears jaundiced. Psychiatry: Judgement and insight appear impaired.    Data Reviewed: I have personally reviewed following labs and imaging studies  CBC: Recent Labs  Lab 08/10/17 1125 08/12/17 0352 08/13/17 0612 08/14/17 0841 08/15/17 0339  WBC 13.9* 10.2 9.9 9.6 10.2  HGB 9.8* 8.7* 8.5* 8.8* 9.1*  HCT 30.3* 27.6* 26.9* 27.5* 28.2*  MCV 94.7 94.8 95.7 94.5 95.3  PLT 67* 52* 45* 52* 55*   Basic Metabolic Panel: Recent Labs  Lab 08/11/17 0603 08/12/17 0352 08/13/17 0612 08/13/17 0834 08/14/17 0536 08/15/17 0339  NA 138 136 134*  --  133* 134*  K 4.6 3.9 4.6  --  4.0 4.0  CL 105 103 104  --  103 104  CO2 16* 20* 18*  --  18* 19*  GLUCOSE 81 99 158*  --  138* 117*  BUN 75* 76* 74*  --  74* 80*  CREATININE 3.99* 4.22* 4.12*  --  4.29* 4.40*  CALCIUM 8.1* 8.2* 7.9*  --  8.1* 8.2*  PHOS  --   --   --  5.2* 5.1* 5.2*   GFR: Estimated Creatinine Clearance: 9.5 mL/min (A) (by C-G formula based on SCr of 4.4 mg/dL (H)). Liver Function Tests: Recent Labs  Lab 08/13/17 0612 08/14/17 0536 08/15/17 0339  AST 73*  --   --   ALT 28  --   --   ALKPHOS 190*  --   --   BILITOT 2.3*  --   --   PROT 5.8*  --   --     ALBUMIN 1.6* 1.6* 1.6*   Recent Labs  Lab 08/13/17 0612  LIPASE 20   No results for input(s): AMMONIA in the last 168 hours. Coagulation Profile: No results for input(s): INR, PROTIME in the last 168 hours. Cardiac Enzymes: No results for input(s): CKTOTAL, CKMB, CKMBINDEX, TROPONINI in the last 168 hours. BNP (last 3 results) Recent Labs    09/20/16 1458 03/16/17 1441 04/11/17 1345  PROBNP 140.0* 287.0* 215.0*   HbA1C: No results for input(s): HGBA1C in the last 72 hours. CBG: Recent Labs  Lab 08/14/17 2216 08/14/17 2320 08/15/17 0408 08/15/17 0653 08/15/17 0805  GLUCAP 107* 118* 108* 88 81   Lipid Profile: No results for input(s): CHOL, HDL, LDLCALC, TRIG, CHOLHDL, LDLDIRECT in the last 72 hours. Thyroid Function Tests: No results for input(s): TSH, T4TOTAL, FREET4, T3FREE, THYROIDAB in the last 72 hours. Anemia Panel: No results for input(s): VITAMINB12, FOLATE, FERRITIN, TIBC, IRON, RETICCTPCT in the last 72 hours. Sepsis Labs: Recent Labs  Lab 08/09/17 0552 08/09/17 1846  PROCALCITON 0.26  --   LATICACIDVEN  --  2.1*    Recent Results (from the past 240 hour(s))  Blood culture (routine x 2)     Status: None   Collection Time: 08/08/17 12:43 AM  Result Value Ref Range Status   Specimen Description BLOOD RIGHT ARM  Final   Special Requests   Final    BOTTLES DRAWN AEROBIC AND ANAEROBIC Blood Culture adequate volume   Culture NO GROWTH 5 DAYS  Final   Report Status 08/13/2017 FINAL  Final  Blood culture (routine x 2)     Status: None   Collection Time: 08/08/17  1:44 AM  Result Value Ref Range Status   Specimen Description BLOOD RIGHT HAND  Final   Special Requests   Final    BOTTLES DRAWN AEROBIC ONLY Blood Culture adequate volume   Culture NO GROWTH 5 DAYS  Final   Report Status 08/13/2017 FINAL  Final  MRSA PCR Screening     Status: None   Collection Time: 08/11/17 11:48 AM  Result Value Ref Range Status   MRSA by PCR NEGATIVE NEGATIVE Final     Comment:        The GeneXpert MRSA Assay (FDA approved for NASAL specimens only), is one component of a comprehensive MRSA colonization surveillance program. It is not intended to diagnose MRSA infection nor to guide or monitor treatment for MRSA infections.          Radiology Studies: [place] Abdomen Limited  Result Date: 08/13/2017 CLINICAL DATA:  83 year old female with limited left upper quadrant ultrasound to evaluate for splenomegaly. EXAM: ULTRASOUND ABDOMEN LIMITED COMPARISON:  None. FINDINGS: The spleen appears unremarkable and demonstrates normal echotexture. It measures 8.1 x 10.4 x 5.2 cm. Overall splenic volume is 230 cc. This is within normal limits. No focal masses are identified. Incidental note is made of a left pleural effusion. IMPRESSION: 1. Normal size and appearance of the spleen. 2. Incidental left pleural effusion. Electronically Signed   By: Sande BrothersSerena  Chacko M.D.   On: 08/13/2017 13:10        Scheduled Meds: . insulin aspart  0-9 Units Subcutaneous Q4H  . mouth rinse  15 mL Mouth Rinse BID  . sodium chloride flush  10-40 mL Intracatheter Q12H  . sodium chloride flush  3 mL Intravenous Q12H  . sodium chloride flush  3 mL Intravenous Q12H   Continuous Infusions: . sodium chloride    . dextrose 5 % and 0.45% NaCl       LOS: 7 days     Jacquelin Hawkingalph Kamaljit Hizer, MD [facility] 08/15/2017, 10:43 AM Pager: [phone]  If 7PM-7AM, please contact night-coverage [email or web address] Password [email or web address] 08/15/2017, 10:43 AM

## 2017-08-15 NOTE — Progress Notes (Signed)
Occupational Therapy Discharge Patient Details Name: Danielle Benitez MRN: [id number] DOB: 08-04-1932 Today's Date: 08/15/2017    Patient discharged from OT services secondary to medical decline - will need to re-order OT to resume therapy services.  Please see latest therapy progress note for current level of functioning and progress toward goals.    Progress and discharge plan discussed with patient and/or caregiver: Patient/Caregiver agrees with plan.  GO     Marcy Siren, OT Pager [phone] 08/15/2017   Danielle Benitez 08/15/2017, 12:14 PM

## 2017-08-15 NOTE — Progress Notes (Signed)
Spoke with [employer] representative about pacemaker --pt has order to have device if ICD to be cut off, unclear if pacemaker needs to be cut off. After speaking with [employer] rep, pacemakers are not cut off, only ICDs.

## 2017-08-16 DIAGNOSIS — Z515 Encounter for palliative care: Secondary | ICD-10-CM

## 2017-08-16 DIAGNOSIS — N184 Chronic kidney disease, stage 4 (severe): Secondary | ICD-10-CM

## 2017-08-16 LAB — GLUCOSE, CAPILLARY: Glucose-Capillary: 117 mg/dL — ABNORMAL HIGH (ref 65–99)

## 2017-08-16 NOTE — Consult Note (Signed)
   [facility] CM Inpatient Consult   08/16/2017  Danielle Benitez [birth year] [id number]   Chart reviewed for multiple admissions in the [facility] ACO. Patient with a previous history with [facility] Care Management.  Review reveals that the patient is currently for Hospice Care. No [facility] Care Management needs at this time as the patient will receive community care management under Hospice/Palliative Care.   For questions, please contact:  Charlesetta Shanks, RN BSN CCM [facility]  [phone] business mobile phone Toll free office [phone]

## 2017-08-16 NOTE — Care Management (Signed)
Case manager following for disposition, patient is comfort care.

## 2017-08-16 NOTE — Progress Notes (Signed)
PROGRESS NOTE    Danielle Benitez  ZOX:[id number] DOB: 1931/11/01 DOA: 07/21/2017 PCP: Joaquim Nam, MD   Brief Narrative: Danielle Benitez is a 82 y.o. female with a medical history of insulin-dependent diabetes mellitus, hypertension, history of CVA, fibrillation on Eliquis, chronic diastolic heart failure, chronic kidney disease stage IV.  She presented with severe left wrist pain found to have a left wrist fracture.  Orthopedic surgery was consulted and opted for nonoperative management.  Patient developed oliguric AKI and found to have cirrhosis. Patient declining fairly quickly. Plan is for hospice if no improvement in 1-2 days.   Assessment & Plan:   Principal Problem:   Left wrist fracture, closed, initial encounter Active Problems:   DM, type 2, with renal complications    Mild CAD - 20-30% RCA in 2013   Depression   CKD (chronic kidney disease), stage IV (HCC)   Essential hypertension   Normocytic anemia   Atrial fibrillation - 07/11/15 by PPM interrogation, recurrent 10/19 after DCCV 10/14   Thrombocytopenia (HCC)   Chronic diastolic CHF (congestive heart failure) (HCC)   History of CVA (cerebrovascular accident)   Occult GI bleeding   HCAP (healthcare-associated pneumonia)   Closed fracture of left wrist   Fall   Palliative care by specialist   Goals of care, counseling/discussion   Terminal care   Left wrist fracture Closed fracture. Pain improved. Comfort care.  HCAP Blood cultures negative to date. Sputum gram stain/culture not available. -discontinue cefepime -Comfort care  Acute metabolic encephalopathy Appears secondary to uremia. Will likely not improve. -Comfort care  Diabetes mellitus, type 2 -discontinue SSI  CAD 20-30% RCA -discontinue atorvastatin -Comfort care  Depression -discontinue Wellbutrin -Comfort care  Acute kidney injury on CKD stage IV Worsening signs of uremia with mental status changes. Nephrology signed off. Patient  transitioned to comfort care.  Essential hypertension -discontinued metoprolol secondary to hypotension  Normocytic anemia Stable. Chronic.  Atrial fibrillation Patient with PPM. -discontinue eliquis and amiodarone -discontinue telemetry  Thrombocytopenia Chronic. Stabilized. Unknown etiology. Chart review shows no workup. No spontaneous bleeding. Discussed with hematology and recommends to continue Eliquis at current dose. Likely contributed to be new diagnosis of cirrhosis and worsened likely by iatrogenic means. Spleen normal. Comfort measures  Chronic diastolic heart failure Last echo from 04/2017 significant for an EF of 60-65% with grade 1 diastolic dysfunction  History of CVA Stable -discontinue Eliquis secondary to comfort measures  History of dysphagia Emesis Patient with an EGD back in 2016 significant for dysmotility. Dysphagia 3 diet recommended at that time. Now with symptoms of emesis with oral intake. Improved with Zofran. This appears to be contributing to her overall recent weight loss. Esophagram significant for esophageal dysmotility. Unsure if this is related to uremia since it has been present for the last few weeks and doubt secondary to narcotics for the same reason but appears to be worsened in current setting. Small bowel movement. No history of gastroparesis. Episode yesterday. -Reglan and Zofran scheduled   Cirrhosis As seen on ultrasound. Likely causing thrombocytopenia. Unknown etiology. Hepatitis panel negative.  DVT prophylaxis: Eliquis Code Status: Full code Family Communication: Daughter and son-in-law at bedside Disposition Plan: Home hospice vs hospital death likely   Consultants:   Orthopedic surgery  Procedures:   None  Antimicrobials:  Vancomycin (10/28>>11/1)  Cefepime (10/28>>11/4)   Subjective: Large volume emesis yesterday. Not opening her eyes. Some apneic spells  Objective: Vitals:   08/14/17 1300 08/14/17 2113  08/15/17 0402 08/16/17 0500  BP: (!) 127/44 (!) 143/45 (!) 134/56 (!) 104/43  Pulse: 70 69 70 69  Resp: 16     Temp: (!) 97.5 F (36.4 C) 97.6 F (36.4 C) 97.8 F (36.6 C) 98 F (36.7 C)  TempSrc: Oral Axillary Axillary Axillary  SpO2: 99% 98% 99% 97%  Weight:      Height:        Intake/Output Summary (Last 24 hours) at 08/16/2017 1112 Last data filed at 08/15/2017 1700 Gross per 24 hour  Intake 0 ml  Output -  Net 0 ml   Filed Weights   08/10/2017 2110  Weight: 80.8 kg (178 lb 3.2 oz)    Examination:  General exam: Appears calm and comfortable. Central nervous system: Stupor Skin: No cyanosis. No rashes. Appears jaundiced.    Data Reviewed: I have personally reviewed following labs and imaging studies  CBC: Recent Labs  Lab 08/10/17 1125 08/12/17 0352 08/13/17 0612 08/14/17 0841 08/15/17 0339  WBC 13.9* 10.2 9.9 9.6 10.2  HGB 9.8* 8.7* 8.5* 8.8* 9.1*  HCT 30.3* 27.6* 26.9* 27.5* 28.2*  MCV 94.7 94.8 95.7 94.5 95.3  PLT 67* 52* 45* 52* 55*   Basic Metabolic Panel: Recent Labs  Lab 08/11/17 0603 08/12/17 0352 08/13/17 0612 08/13/17 0834 08/14/17 0536 08/15/17 0339  NA 138 136 134*  --  133* 134*  K 4.6 3.9 4.6  --  4.0 4.0  CL 105 103 104  --  103 104  CO2 16* 20* 18*  --  18* 19*  GLUCOSE 81 99 158*  --  138* 117*  BUN 75* 76* 74*  --  74* 80*  CREATININE 3.99* 4.22* 4.12*  --  4.29* 4.40*  CALCIUM 8.1* 8.2* 7.9*  --  8.1* 8.2*  PHOS  --   --   --  5.2* 5.1* 5.2*   GFR: Estimated Creatinine Clearance: 9.5 mL/min (A) (by C-G formula based on SCr of 4.4 mg/dL (H)). Liver Function Tests: Recent Labs  Lab 08/13/17 0612 08/14/17 0536 08/15/17 0339  AST 73*  --   --   ALT 28  --   --   ALKPHOS 190*  --   --   BILITOT 2.3*  --   --   PROT 5.8*  --   --   ALBUMIN 1.6* 1.6* 1.6*   Recent Labs  Lab 08/13/17 0612  LIPASE 20   No results for input(s): AMMONIA in the last 168 hours. Coagulation Profile: No results for input(s): INR, PROTIME  in the last 168 hours. Cardiac Enzymes: No results for input(s): CKTOTAL, CKMB, CKMBINDEX, TROPONINI in the last 168 hours. BNP (last 3 results) Recent Labs    09/20/16 1458 03/16/17 1441 04/11/17 1345  PROBNP 140.0* 287.0* 215.0*   HbA1C: No results for input(s): HGBA1C in the last 72 hours. CBG: Recent Labs  Lab 08/14/17 2320 08/15/17 0408 08/15/17 0653 08/15/17 0805 08/16/17 0502  GLUCAP 118* 108* 88 81 117*   Lipid Profile: No results for input(s): CHOL, HDL, LDLCALC, TRIG, CHOLHDL, LDLDIRECT in the last 72 hours. Thyroid Function Tests: No results for input(s): TSH, T4TOTAL, FREET4, T3FREE, THYROIDAB in the last 72 hours. Anemia Panel: No results for input(s): VITAMINB12, FOLATE, FERRITIN, TIBC, IRON, RETICCTPCT in the last 72 hours. Sepsis Labs: Recent Labs  Lab 08/09/17 1846  LATICACIDVEN 2.1*    Recent Results (from the past 240 hour(s))  Blood culture (routine x 2)     Status: None   Collection Time: 08/08/17 12:43 AM  Result Value Ref  Range Status   Specimen Description BLOOD RIGHT ARM  Final   Special Requests   Final    BOTTLES DRAWN AEROBIC AND ANAEROBIC Blood Culture adequate volume   Culture NO GROWTH 5 DAYS  Final   Report Status 08/13/2017 FINAL  Final  Blood culture (routine x 2)     Status: None   Collection Time: 08/08/17  1:44 AM  Result Value Ref Range Status   Specimen Description BLOOD RIGHT HAND  Final   Special Requests   Final    BOTTLES DRAWN AEROBIC ONLY Blood Culture adequate volume   Culture NO GROWTH 5 DAYS  Final   Report Status 08/13/2017 FINAL  Final  MRSA PCR Screening     Status: None   Collection Time: 08/11/17 11:48 AM  Result Value Ref Range Status   MRSA by PCR NEGATIVE NEGATIVE Final    Comment:        The GeneXpert MRSA Assay (FDA approved for NASAL specimens only), is one component of a comprehensive MRSA colonization surveillance program. It is not intended to diagnose MRSA infection nor to guide or monitor  treatment for MRSA infections.          Radiology Studies: No results found.      Scheduled Meds: . mouth rinse  15 mL Mouth Rinse BID  . metoCLOPramide (REGLAN) injection  5 mg Intravenous Q12H  . ondansetron (ZOFRAN) IV  4 mg Intravenous Q8H  . sodium chloride flush  10-40 mL Intracatheter Q12H  . sodium chloride flush  3 mL Intravenous Q12H  . sodium chloride flush  3 mL Intravenous Q12H   Continuous Infusions: . sodium chloride    . dextrose 5 % and 0.45% NaCl 20 mL/hr (08/15/17 1045)     LOS: 8 days     Jacquelin Hawkingalph Maeson Purohit, MD [facility] 08/16/2017, 11:12 AM Pager: [phone]  If 7PM-7AM, please contact night-coverage [email or web address] Password [email or web address] 08/16/2017, 11:12 AM

## 2017-08-16 NOTE — Progress Notes (Signed)
Daily Progress Note   Patient Name: Danielle Benitez       Date: 08/16/2017 DOB: [birth year]  Age: 83 y.o. MRN#: [id number] Attending Physician: Narda Bonds, MD Primary Care Physician: Joaquim Nam, MD Admit Date: 07/14/2017  Reason for Consultation/Follow-up: Establishing goals of care  Subjective/GOC: Patient unresponsive to sternal rub. Shallow respirations with periods of apnea. No signs or symptoms of discomfort. Episode of vomiting last night. Patient is receiving scheduled Zofran and Reglan.   Daughter [name]), son Freida Busman), son-in-law, daughter-in-law, and grandchildren at bedside. Answered questions and concerns regarding EOL expectations. Discussed medications as needed for symptom management and prognosis a few hours-day.   Therapeutic listening and emotional/spiritual support provided as family shares stories of Ms. Dehart. Their pastor has been to bedside.   Length of Stay: 8  Current Medications: Scheduled Meds:  . mouth rinse  15 mL Mouth Rinse BID  . metoCLOPramide (REGLAN) injection  5 mg Intravenous Q12H  . ondansetron (ZOFRAN) IV  4 mg Intravenous Q8H  . sodium chloride flush  10-40 mL Intracatheter Q12H  . sodium chloride flush  3 mL Intravenous Q12H  . sodium chloride flush  3 mL Intravenous Q12H    Continuous Infusions: . sodium chloride    . dextrose 5 % and 0.45% NaCl 20 mL/hr (08/15/17 1045)    PRN Meds: sodium chloride, bisacodyl, fentaNYL (SUBLIMAZE) injection, LORazepam, morphine injection, sodium chloride flush, sodium chloride flush, sodium chloride flush  Physical Exam  Cardiovascular: Regular rhythm.  Pulmonary/Chest: She has decreased breath sounds.  Shallow, periods of apnea  Neurological: She is unresponsive.  Skin: Skin is warm and dry. There  is pallor.  Nursing note and vitals reviewed.          Vital Signs: BP (!) 104/43 (BP Location: Left Leg)   Pulse 69   Temp 98 F (36.7 C) (Axillary)   Resp 16   Ht 5\' 2"  (1.575 m)   Wt 80.8 kg (178 lb 3.2 oz)   SpO2 97%   BMI 32.59 kg/m  SpO2: SpO2: 97 % O2 Device: O2 Device: Not Delivered O2 Flow Rate:    Intake/output summary:   Intake/Output Summary (Last 24 hours) at 08/16/2017 1021 Last data filed at 08/15/2017 1700 Gross per 24 hour  Intake 0 ml  Output -  Net 0 ml  LBM: Last BM Date: 08/13/17 Baseline Weight: Weight: 80.8 kg (178 lb 3.2 oz) Most recent weight: Weight: 80.8 kg (178 lb 3.2 oz)  Palliative Assessment/Data: PPS 10%    Patient Active Problem List   Diagnosis Date Noted  . Palliative care by specialist   . Goals of care, counseling/discussion   . Fall 08/11/2017  . Occult GI bleeding 08/08/2017  . Left wrist fracture, closed, initial encounter 08/08/2017  . HCAP (healthcare-associated pneumonia) 08/08/2017  . Closed fracture of left wrist   . Leg weakness 08/04/2017  . Muscular deconditioning 08/04/2017  . Upper respiratory tract infection 07/07/2017  . Confusion 06/03/2017  . History of CVA (cerebrovascular accident)   . Hyponatremia   . Stage 3 chronic kidney disease (HCC)   . AKI (acute kidney injury) (HCC)   . Acute blood loss anemia   . UTI (urinary tract infection) 04/28/2017  . Acute metabolic encephalopathy 04/28/2017  . Acute lower UTI 04/28/2017  . Diabetes mellitus with diabetic nephropathy (HCC) 04/28/2017  . Dyslipidemia associated with type 2 diabetes mellitus (HCC) 04/28/2017  . Chronic diastolic CHF (congestive heart failure) (HCC) 04/28/2017  . Altered mental status   . TIA (transient ischemic attack) 03/31/2017  . Thrombocytopenia (HCC) 03/31/2017  . Chest pain 02/14/2017  . Dysuria 09/21/2016  . At high risk for falls 06/25/2016  . Skin tear of elbow without complication 06/24/2016  . Injury of left shoulder and  upper arm 06/24/2016  . Unequal blood pressure in upper extremities 06/03/2016  . Cardiac pacemaker in situ 04/12/2016  . Cough 03/08/2016  . Risk for falls 03/05/2016  . Insomnia 01/21/2016  . Chronic systolic congestive heart failure, NYHA class 3 (HCC) 01/20/2016  . LFT elevation 11/21/2015  . Weakness 11/06/2015  . Dysphagia   . Esophageal dysmotility 08/05/2015  . Anticoagulated- Eliquis 07/31/2015  . Normochromic normocytic anemia 07/31/2015  . Normocytic anemia 07/18/2015  . Atrial fibrillation - 07/11/15 by PPM interrogation, recurrent 10/19 after DCCV 10/14 07/18/2015  . Moderate mitral regurgitation 07/18/2015  . Moderate tricuspid regurgitation 07/18/2015  . History of pacemaker - Medtronic 07/18/2015  . CKD (chronic kidney disease), stage IV (HCC)   . Orthostatic hypotension   . Essential hypertension   . Pericardial effusion 07/15/2015  . Female stress incontinence 05/06/2015  . Knee pain 12/18/2014  . Low back pain 02/27/2014  . Obese 02/27/2014  . Depression 02/27/2014  . Pain in joint, shoulder region 11/22/2013  . Eczema 11/22/2013  . S/P cardiac pacemaker procedure, PPM Medtronic REVO placed 01/11/2012 01/11/2012  . COPD bronchitis by CXR 01/10/2012  . Dyslipidemia 01/10/2012  . Mild CAD - 20-30% RCA in 2013 01/10/2012  . Fatigue 01/10/2012  . Sinus bradycardia, HR as low As 35 01/10/2012  . Mild aortic stenosis 01/10/2012  . Chest wall pain 01/08/2012  . SOB (shortness of breath) 01/08/2012  . DM, type 2, with renal complications  01/08/2012    Palliative Care Assessment & Plan    Assessment:  AKI, IV CKD, possible nephrosclerosis Nausea, with vomiting earlier in this hospitalization Low blood sugars, DM HTN, CVA history, A fib history.  Recent fall, fracture L wrist.  Now actively dying   Recommendations/Plan:  Comfort measures only.   Continue medications as needed for s/s of distress.   Discontinue pulse ox at bedside. Notified RN.    Discussed EOL expecations.   Anticipate prognosis a few hours to few days. Getting closer to EOL.   Code Status:    Code Status Orders  (  From admission, onward)        Start     Ordered   08/13/17 1139  Do not attempt resuscitation (DNR)  Continuous    Question Answer Comment  In the event of cardiac or respiratory ARREST Do not call a "code blue"   In the event of cardiac or respiratory ARREST Do not perform Intubation, CPR, defibrillation or ACLS   In the event of cardiac or respiratory ARREST Use medication by any route, position, wound care, and other measures to relive pain and suffering. May use oxygen, suction and manual treatment of airway obstruction as needed for comfort.      08/13/17 1138    Code Status History    Date Active Date Inactive Code Status Order ID Comments User Context   08/08/2017 02:54 08/13/2017 11:38 Full Code [id number]  Briscoe Deutscherpyd, Timothy S, MD ED   08/08/2017 02:54 08/08/2017 02:54 Full Code [id number]  Briscoe Deutscherpyd, Timothy S, MD ED   07/18/2017 11:41 07/18/2017 18:19 Full Code [id number]  Laurey MoraleMcLean, Dalton S, MD Inpatient   07/07/2017 12:37 07/09/2017 17:52 Full Code [id number]  Marcos EkeWertman, Sara E, PA-C ED   06/03/2017 17:08 06/04/2017 19:32 Full Code [id number]  Vassie LollMadera, Carlos, MD ED   04/28/2017 20:46 05/02/2017 16:33 Full Code [id number]  Alison Murrayevine, Alma M, MD Inpatient   04/02/2017 12:17 04/05/2017 21:34 Partial Code [id number]  Laurey MoraleMcLean, Dalton S, MD Inpatient   04/01/2017 00:01 04/02/2017 12:17 Partial Code [id number]  Alberteen Samanford, Christopher P, MD Inpatient   02/14/2017 22:13 02/16/2017 21:13 Full Code [id number]  Therisa Doyneoutova, Anastassia, MD Inpatient   04/12/2016 11:30 04/16/2016 19:58 Full Code [id number]  Marinus Mawaylor, Gregg W, MD Inpatient   08/05/2015 00:41 08/11/2015 19:15 Full Code [id number]  Laurey MoraleMcLean, Dalton S, MD Inpatient   07/30/2015 17:44 08/03/2015 17:25 Full Code [id number]  Ellsworth LennoxStrader, Brittany M, PA Inpatient   07/18/2015 16:50 07/28/2015 20:50 Full Code [id number]  Laurann Montanaunn, Dayna N, PA-C Inpatient    04/09/2014 15:50 04/10/2014 10:24 Full Code [id number]  Glendora ScoreBuchanan, Brian L, PA-C Inpatient   01/11/2012 11:32 01/12/2012 19:49 Full Code [id number]  Toniann KetMach, Linda L, RN Inpatient   01/08/2012 21:05 01/11/2012 11:32 Full Code [id number]  Alonza Bogusuvall, Deborah Gray, RN ED    Advance Directive Documentation     Most Recent Value  Type of Advance Directive  Healthcare Power of Attorney  Pre-existing out of facility DNR order (yellow form or pink MOST form)  No data  "MOST" Form in Place?  No data       Prognosis:   Hours - Days  Discharge Planning:  Anticipated Hospital Death  Care plan was discussed with  Daughter, son, DIL, SIL, RN  Thank you for allowing the Palliative Medicine Team to assist in the care of this patient.   Time In: 0950 Time Out: 1025 Total Time 35min Prolonged Time Billed  no       Greater than 50%  of this time was spent counseling and coordinating care related to the above assessment and plan.  Vennie HomansMegan Katora Fini, FNP-C Palliative Medicine Team  Phone: [phone] Fax: [phone]  Please contact Palliative Medicine Team phone at [phone] for questions and concerns.

## 2017-08-16 NOTE — Plan of Care (Signed)
  Physical Regulation: Ability to maintain clinical measurements within normal limits will improve 08/16/2017 1351 - Progressing by Darrow Bussing, RN   Tissue Perfusion: Risk factors for ineffective tissue perfusion will decrease 08/16/2017 1351 - Progressing by Darrow Bussing, RN

## 2017-08-17 DIAGNOSIS — T148XXA Other injury of unspecified body region, initial encounter: Secondary | ICD-10-CM

## 2017-08-17 LAB — GLUCOSE, CAPILLARY
Glucose-Capillary: 164 mg/dL — ABNORMAL HIGH (ref 65–99)
Glucose-Capillary: 166 mg/dL — ABNORMAL HIGH (ref 65–99)

## 2017-08-17 MED ORDER — PROCHLORPERAZINE EDISYLATE 5 MG/ML IJ SOLN
5.0000 mg | Freq: Once | INTRAMUSCULAR | Status: AC
  Administered 2017-08-17: 5 mg via INTRAVENOUS
  Filled 2017-08-17: qty 1

## 2017-08-17 MED ORDER — PROMETHAZINE HCL 25 MG/ML IJ SOLN: 12.5000 mg | Freq: Four times a day (QID) | INTRAMUSCULAR | Status: DC | PRN

## 2017-08-17 MED ORDER — GLYCOPYRROLATE 0.2 MG/ML IJ SOLN
0.2000 mg | INTRAMUSCULAR | Status: DC | PRN
  Administered 2017-08-17 – 2017-08-21 (×7): 0.2 mg via INTRAVENOUS
  Filled 2017-08-17 (×10): qty 1

## 2017-08-17 NOTE — Progress Notes (Signed)
PROGRESS NOTE    Danielle Benitez  ZOX:[id number] DOB: April 21, 1932 DOA: 03/15/17 PCP: Joaquim Namuncan, [name], MD   Brief Narrative:  Danielle Benitez is a 83 y.o. female with a medical history of insulin-dependent diabetes mellitus, hypertension, history of CVA, fibrillation on Eliquis, chronic diastolic heart failure, chronic kidney disease stage IV.  She presented with severe left wrist pain found to have a left wrist fracture.  Orthopedic surgery was consulted and opted for nonoperative management.  Patient developed oliguric AKI and found to have cirrhosis. Patient declined fairly quickly and Palliative Care Consulted and patient now Comfort Care. High Risk for Decompensation and In Hospital Death is expected.   Assessment & Plan:   Principal Problem:   Left wrist fracture, closed, initial encounter Active Problems:   DM, type 2, with renal complications    Mild CAD - 20-30% RCA in 2013   Depression   CKD (chronic kidney disease), stage IV (HCC)   Essential hypertension   Normocytic anemia   Atrial fibrillation - 07/11/15 by PPM interrogation, recurrent 10/19 after DCCV 10/14   Thrombocytopenia (HCC)   Chronic diastolic CHF (congestive heart failure) (HCC)   History of CVA (cerebrovascular accident)   Occult GI bleeding   HCAP (healthcare-associated pneumonia)   Closed fracture of left wrist   Fall   Palliative care by specialist   Goals of care, counseling/discussion   Terminal care  Left wrist fracture -Closed fracture. Pain improved. Comfort care order set utilized and patient .  HCAP -Blood cultures negative to date.  -Sputum gram stain/culture not available. -Discontinue Cefepime -Comfort care  Acute metabolic encephalopathy -Appears secondary to uremia. Will likely not improve. -Comfort care  Diabetes mellitus, type 2 -Discontinued SSI  CAD -20-30% RCA -Discontinue atorvastatin -Comfort care  Depression -Discontinue Wellbutrin -Comfort care  Acute kidney  injury on CKD stage IV -Worsening signs of uremia with mental status changes. -Nephrology signed off. Patient transitioned to comfort care.  Essential hypertension -Discontinued metoprolol secondary to hypotension  Normocytic anemia -Last Hb/Hct was 9.1/28.2 -Will not Repeat Labs as patient is Comfort Care  Atrial fibrillation -Patient with PPM. -Discontinue eliquis and amiodarone -Discontinue telemetry  Thrombocytopenia -Chronic. Stabilized. Unknown etiology. Chart review shows no workup. No spontaneous bleeding.  -Discussed with hematology and recommended to continue Eliquis at current dose. Likely contributed to be new diagnosis of cirrhosis and worsened likely by iatrogenic means. Spleen normal. Comfort measures enacted so Eliquis discontinued   Chronic diastolic heart failure -Last echo from 04/2017 significant for an EF of 60-65% with grade 1 diastolic dysfunction  History of CVA -Discontinue Eliquis secondary to comfort measures  History of dysphagia Emesis -Patient with an EGD back in 2016 significant for dysmotility. -Dysphagia 3 diet recommended at that time. Now with symptoms of emesis with oral intake. Improved with Zofran. This appears to be contributing to her overall recent weight loss. Esophagram significant for esophageal dysmotility. Unsure if this is related to uremia since it has been present for the last few weeks and doubt secondary to narcotics for the same reason but appears to be worsened in current setting. Small bowel movement. No history of gastroparesis. Episode yesterday. -Reglan and Zofran scheduled -Comfort Measures   Cirrhosis As seen on ultrasound. Likely causing thrombocytopenia. Unknown etiology. Hepatitis panel negative. -Will not work up further now that patient is Comfort Measures   DVT prophylaxis: None. Patient is Comfort Care Code Status: DO NOT RESUSCITATE  Family Communication: Discussed with Family at Bedside Disposition Plan:  Anticipate In  Hospital Death   Consultants:   Palliative Care Medicine   Orthopedic Surgery  Procedures: None   Antimicrobials:  Anti-infectives (From admission, onward)   Start     Dose/Rate Route Frequency Ordered Stop   08/10/17 0600  vancomycin (VANCOCIN) 500 mg in sodium chloride 0.9 % 100 mL IVPB  Status:  Discontinued     500 mg 100 mL/hr over 60 Minutes Intravenous Every 48 hours 08/08/17 0349 08/08/17 1048   08/10/17 0200  vancomycin (VANCOCIN) IVPB 1000 mg/200 mL premix  Status:  Discontinued     1,000 mg 200 mL/hr over 60 Minutes Intravenous Every 48 hours 08/08/17 1048 08/12/17 1206   08/08/17 2200  ceFEPIme (MAXIPIME) 1 g in dextrose 5 % 50 mL IVPB  Status:  Discontinued     1 g 100 mL/hr over 30 Minutes Intravenous Every 24 hours 08/08/17 0253 08/15/17 0928   08/08/17 0045  vancomycin (VANCOCIN) IVPB 1000 mg/200 mL premix     1,000 mg 200 mL/hr over 60 Minutes Intravenous  Once 08/08/17 0035 08/08/17 0312   08/08/17 0015  ceFEPIme (MAXIPIME) 2 g in dextrose 5 % 50 mL IVPB     2 g 100 mL/hr over 30 Minutes Intravenous  Once 08/08/17 0006 08/08/17 0202   24-Aug-2017 2345  cefTRIAXone (ROCEPHIN) 1 g in dextrose 5 % 50 mL IVPB  Status:  Discontinued     1 g 100 mL/hr over 30 Minutes Intravenous  Once 24-Aug-2017 2342 08/08/17 0006   08-24-17 2345  azithromycin (ZITHROMAX) 500 mg in dextrose 5 % 250 mL IVPB  Status:  Discontinued     500 mg 250 mL/hr over 60 Minutes Intravenous  Once 08-24-17 2342 08/08/17 0006     Subjective: Seen and examined and would not respond to verbal of physical stimuli. Resting comfortably. Family states she had a rough night last night and had some gagging and wheezing. High risk for decompensation.   Objective: Vitals:   08/15/17 0402 08/16/17 0500 08/16/17 2349 08/17/17 0548  BP: (!) 134/56 (!) 104/43 (!) 123/35 (!) 104/26  Pulse: 70 69 70 70  Resp:    10  Temp: 97.8 F (36.6 C) 98 F (36.7 C)  98.6 F (37 C)  TempSrc: Axillary  Axillary  Axillary  SpO2: 99% 97% 96% 96%  Weight:      Height:        Intake/Output Summary (Last 24 hours) at 08/17/2017 1812 Last data filed at 08/17/2017 0900 Gross per 24 hour  Intake 925 ml  Output -  Net 925 ml   Filed Weights   24-Aug-2017 2110  Weight: 80.8 kg (178 lb 3.2 oz)   Examination: Physical Exam:  Constitutional: WN/WD obese Caucasian female in NAD and appears calm and is not responsive to physical or verbal stimuli Eyes: Had eyes closed entire encounter ENMT: External Ears, Nose appear normal. Does not respond to verbal stimuli Neck: Appears normal, supple, no cervical masses, normal ROM, no appreciable thyromegaly  Respiratory: Diminished with some mild wheezing and crackles.  Has had shallow breathing. Not wearing Supplemental O2.  Cardiovascular: RRR, no murmurs / rubs / gallops. S1 and S2 auscultated. Some Lower extremity edema.   Abdomen: Soft, non-tender, non-distended. No masses palpated. No appreciable hepatosplenomegaly. Bowel sounds positive.  GU: Deferred. Musculoskeletal: No clubbing / cyanosis of digits/nails. No joint deformity upper and lower extremities.  Skin: No rashes, lesions, ulcers on a limited skin eval. No induration; Warm and dry.  Neurologic: Not awake or alert. Does not respond  to physicial or verbal stimuli. Psychiatric: Unable to assess due to current condition.    Data Reviewed: I have personally reviewed following labs and imaging studies  CBC: Recent Labs  Lab 08/12/17 0352 08/13/17 0612 08/14/17 0841 08/15/17 0339  WBC 10.2 9.9 9.6 10.2  HGB 8.7* 8.5* 8.8* 9.1*  HCT 27.6* 26.9* 27.5* 28.2*  MCV 94.8 95.7 94.5 95.3  PLT 52* 45* 52* 55*   Basic Metabolic Panel: Recent Labs  Lab 08/11/17 0603 08/12/17 0352 08/13/17 0612 08/13/17 0834 08/14/17 0536 08/15/17 0339  NA 138 136 134*  --  133* 134*  K 4.6 3.9 4.6  --  4.0 4.0  CL 105 103 104  --  103 104  CO2 16* 20* 18*  --  18* 19*  GLUCOSE 81 99 158*  --  138* 117*    BUN 75* 76* 74*  --  74* 80*  CREATININE 3.99* 4.22* 4.12*  --  4.29* 4.40*  CALCIUM 8.1* 8.2* 7.9*  --  8.1* 8.2*  PHOS  --   --   --  5.2* 5.1* 5.2*   GFR: Estimated Creatinine Clearance: 9.5 mL/min (A) (by C-G formula based on SCr of 4.4 mg/dL (H)). Liver Function Tests: Recent Labs  Lab 08/13/17 0612 08/14/17 0536 08/15/17 0339  AST 73*  --   --   ALT 28  --   --   ALKPHOS 190*  --   --   BILITOT 2.3*  --   --   PROT 5.8*  --   --   ALBUMIN 1.6* 1.6* 1.6*   Recent Labs  Lab 08/13/17 0612  LIPASE 20   No results for input(s): AMMONIA in the last 168 hours. Coagulation Profile: No results for input(s): INR, PROTIME in the last 168 hours. Cardiac Enzymes: No results for input(s): CKTOTAL, CKMB, CKMBINDEX, TROPONINI in the last 168 hours. BNP (last 3 results) Recent Labs    09/20/16 1458 03/16/17 1441 04/11/17 1345  PROBNP 140.0* 287.0* 215.0*   HbA1C: No results for input(s): HGBA1C in the last 72 hours. CBG: Recent Labs  Lab 08/15/17 0653 08/15/17 0805 08/16/17 0502 08/16/17 2353 08/17/17 0546  GLUCAP 88 81 117* 164* 166*   Lipid Profile: No results for input(s): CHOL, HDL, LDLCALC, TRIG, CHOLHDL, LDLDIRECT in the last 72 hours. Thyroid Function Tests: No results for input(s): TSH, T4TOTAL, FREET4, T3FREE, THYROIDAB in the last 72 hours. Anemia Panel: No results for input(s): VITAMINB12, FOLATE, FERRITIN, TIBC, IRON, RETICCTPCT in the last 72 hours. Sepsis Labs: No results for input(s): PROCALCITON, LATICACIDVEN in the last 168 hours.  Recent Results (from the past 240 hour(s))  Blood culture (routine x 2)     Status: None   Collection Time: 08/08/17 12:43 AM  Result Value Ref Range Status   Specimen Description BLOOD RIGHT ARM  Final   Special Requests   Final    BOTTLES DRAWN AEROBIC AND ANAEROBIC Blood Culture adequate volume   Culture NO GROWTH 5 DAYS  Final   Report Status 08/13/2017 FINAL  Final  Blood culture (routine x 2)     Status:  None   Collection Time: 08/08/17  1:44 AM  Result Value Ref Range Status   Specimen Description BLOOD RIGHT HAND  Final   Special Requests   Final    BOTTLES DRAWN AEROBIC ONLY Blood Culture adequate volume   Culture NO GROWTH 5 DAYS  Final   Report Status 08/13/2017 FINAL  Final  MRSA PCR Screening     Status:  None   Collection Time: 08/11/17 11:48 AM  Result Value Ref Range Status   MRSA by PCR NEGATIVE NEGATIVE Final    Comment:        The GeneXpert MRSA Assay (FDA approved for NASAL specimens only), is one component of a comprehensive MRSA colonization surveillance program. It is not intended to diagnose MRSA infection nor to guide or monitor treatment for MRSA infections.     Radiology Studies: No results found.   Scheduled Meds: . mouth rinse  15 mL Mouth Rinse BID  . metoCLOPramide (REGLAN) injection  5 mg Intravenous Q12H  . ondansetron (ZOFRAN) IV  4 mg Intravenous Q8H  . sodium chloride flush  10-40 mL Intracatheter Q12H  . sodium chloride flush  3 mL Intravenous Q12H  . sodium chloride flush  3 mL Intravenous Q12H   Continuous Infusions: . sodium chloride    . dextrose 5 % and 0.45% NaCl 20 mL/hr (08/15/17 1045)    LOS: 9 days   Merlene Laughter, DO [facility] Pager [phone]  If 7PM-7AM, please contact night-coverage [email or web address] Password [facility] 08/17/2017, 6:12 PM

## 2017-08-17 NOTE — Progress Notes (Signed)
Daily Progress Note   Patient Name: Danielle Benitez       Date: 08/17/2017 DOB: 01/10/1932  Age: 83 y.o. MRN#: [id number] Attending Physician: Merlene LaughterSheikh, Omair Latif, DO Primary Care Physician: Joaquim Namuncan, [name], MD Admit Date: 07/11/2017  Reason for Consultation/Follow-up: Establishing goals of care  Subjective/GOC: Patient unresponsive to sternal rub. Shallow respirations with periods of apnea. No signs or symptoms of discomfort. Daughter speaks of episode of wheezing, vomiting and gagging last night not relieved by Zofran/Reglan. Compazine x1 was given. Also fentanyl which relieved symptoms. Discussed adding prn robinul and promethazine. Daughter agreeable.   Answered questions and concerns regarding EOL expectations. Discussed all medications as needed for symptom management and prognosis a few hours-few days.   Therapeutic listening and emotional/spiritual support provided as family shares stories of Danielle Benitez.   Length of Stay: 9  Current Medications: Scheduled Meds:  . mouth rinse  15 mL Mouth Rinse BID  . metoCLOPramide (REGLAN) injection  5 mg Intravenous Q12H  . ondansetron (ZOFRAN) IV  4 mg Intravenous Q8H  . sodium chloride flush  10-40 mL Intracatheter Q12H  . sodium chloride flush  3 mL Intravenous Q12H  . sodium chloride flush  3 mL Intravenous Q12H    Continuous Infusions: . sodium chloride    . dextrose 5 % and 0.45% NaCl 20 mL/hr (08/15/17 1045)    PRN Meds: sodium chloride, bisacodyl, fentaNYL (SUBLIMAZE) injection, glycopyrrolate, LORazepam, morphine injection, promethazine, sodium chloride flush, sodium chloride flush, sodium chloride flush  Physical Exam  Cardiovascular: Regular rhythm.  Pulmonary/Chest: She has decreased breath sounds.  Shallow, periods of apnea    Musculoskeletal: She exhibits edema (generalized).  Neurological: She is unresponsive.  Skin: Skin is warm and dry. There is pallor.  Nursing note and vitals reviewed.          Vital Signs: BP (!) 104/26 (BP Location: Left Leg)   Pulse 70   Temp 98.6 F (37 C) (Axillary)   Resp 10   Ht 5\' 2"  (1.575 m)   Wt 80.8 kg (178 lb 3.2 oz)   SpO2 96%   BMI 32.59 kg/m  SpO2: SpO2: 96 % O2 Device: O2 Device: Not Delivered O2 Flow Rate:    Intake/output summary:   Intake/Output Summary (Last 24 hours) at 08/17/2017 0930 Last data filed at 08/17/2017 0300  Gross per 24 hour  Intake 805 ml  Output -  Net 805 ml   LBM: Last BM Date: 08/13/17 Baseline Weight: Weight: 80.8 kg (178 lb 3.2 oz) Most recent weight: Weight: 80.8 kg (178 lb 3.2 oz)  Palliative Assessment/Data: PPS 10%    Patient Active Problem List   Diagnosis Date Noted  . Terminal care   . Palliative care by specialist   . Goals of care, counseling/discussion   . Fall 08/11/2017  . Occult GI bleeding 08/08/2017  . Left wrist fracture, closed, initial encounter 08/08/2017  . HCAP (healthcare-associated pneumonia) 08/08/2017  . Closed fracture of left wrist   . Leg weakness 08/04/2017  . Muscular deconditioning 08/04/2017  . Upper respiratory tract infection 07/07/2017  . Confusion 06/03/2017  . History of CVA (cerebrovascular accident)   . Hyponatremia   . Stage 3 chronic kidney disease (HCC)   . AKI (acute kidney injury) (HCC)   . Acute blood loss anemia   . UTI (urinary tract infection) 04/28/2017  . Acute metabolic encephalopathy 04/28/2017  . Acute lower UTI 04/28/2017  . Diabetes mellitus with diabetic nephropathy (HCC) 04/28/2017  . Dyslipidemia associated with type 2 diabetes mellitus (HCC) 04/28/2017  . Chronic diastolic CHF (congestive heart failure) (HCC) 04/28/2017  . Altered mental status   . TIA (transient ischemic attack) 03/31/2017  . Thrombocytopenia (HCC) 03/31/2017  . Chest pain 02/14/2017   . Dysuria 09/21/2016  . At high risk for falls 06/25/2016  . Skin tear of elbow without complication 06/24/2016  . Injury of left shoulder and upper arm 06/24/2016  . Unequal blood pressure in upper extremities 06/03/2016  . Cardiac pacemaker in situ 04/12/2016  . Cough 03/08/2016  . Risk for falls 03/05/2016  . Insomnia 01/21/2016  . Chronic systolic congestive heart failure, NYHA class 3 (HCC) 01/20/2016  . LFT elevation 11/21/2015  . Weakness 11/06/2015  . Dysphagia   . Esophageal dysmotility 08/05/2015  . Anticoagulated- Eliquis 07/31/2015  . Normochromic normocytic anemia 07/31/2015  . Normocytic anemia 07/18/2015  . Atrial fibrillation - 07/11/15 by PPM interrogation, recurrent 10/19 after DCCV 10/14 07/18/2015  . Moderate mitral regurgitation 07/18/2015  . Moderate tricuspid regurgitation 07/18/2015  . History of pacemaker - Medtronic 07/18/2015  . CKD (chronic kidney disease), stage IV (HCC)   . Orthostatic hypotension   . Essential hypertension   . Pericardial effusion 07/15/2015  . Female stress incontinence 05/06/2015  . Knee pain 12/18/2014  . Low back pain 02/27/2014  . Obese 02/27/2014  . Depression 02/27/2014  . Pain in joint, shoulder region 11/22/2013  . Eczema 11/22/2013  . S/P cardiac pacemaker procedure, PPM Medtronic REVO placed 01/11/2012 01/11/2012  . COPD bronchitis by CXR 01/10/2012  . Dyslipidemia 01/10/2012  . Mild CAD - 20-30% RCA in 2013 01/10/2012  . Fatigue 01/10/2012  . Sinus bradycardia, HR as low As 35 01/10/2012  . Mild aortic stenosis 01/10/2012  . Chest wall pain 01/08/2012  . SOB (shortness of breath) 01/08/2012  . DM, type 2, with renal complications  01/08/2012    Palliative Care Assessment & Plan    Assessment:  AKI, IV CKD, possible nephrosclerosis Nausea, with vomiting earlier in this hospitalization Low blood sugars, DM HTN, CVA history, A fib history.  Recent fall, fracture L wrist.  Now actively dying    Recommendations/Plan:  Comfort measures only.   Continue medications as needed for s/s of distress.   Robinul 0.2mg  IV q4h prn secretions  Promethazine 12.5mg  IV q6h prn n/v not relieved  by zofran/reglan.   Discussed EOL expecations.   Anticipate prognosis a few hours to few days. Nearing the EOL. Not stable for transfer.   Code Status:    Code Status Orders  (From admission, onward)        Start     Ordered   08/13/17 1139  Do not attempt resuscitation (DNR)  Continuous    Question Answer Comment  In the event of cardiac or respiratory ARREST Do not call a "code blue"   In the event of cardiac or respiratory ARREST Do not perform Intubation, CPR, defibrillation or ACLS   In the event of cardiac or respiratory ARREST Use medication by any route, position, wound care, and other measures to relive pain and suffering. May use oxygen, suction and manual treatment of airway obstruction as needed for comfort.      08/13/17 1138    Code Status History    Date Active Date Inactive Code Status Order ID Comments User Context   08/08/2017 02:54 08/13/2017 11:38 Full Code [id number]  Briscoe Deutscher, MD ED   08/08/2017 02:54 08/08/2017 02:54 Full Code [id number]  Briscoe Deutscher, MD ED   07/18/2017 11:41 07/18/2017 18:19 Full Code [id number]  Laurey Morale, MD Inpatient   07/07/2017 12:37 07/09/2017 17:52 Full Code [id number]  Marcos Eke, PA-C ED   06/03/2017 17:08 06/04/2017 19:32 Full Code [id number]  Vassie Loll, MD ED   04/28/2017 20:46 05/02/2017 16:33 Full Code [id number]  Alison Murray, MD Inpatient   04/02/2017 12:17 04/05/2017 21:34 Partial Code [id number]  Laurey Morale, MD Inpatient   04/01/2017 00:01 04/02/2017 12:17 Partial Code [id number]  Alberteen Sam, MD Inpatient   02/14/2017 22:13 02/16/2017 21:13 Full Code [id number]  Therisa Doyne, MD Inpatient   04/12/2016 11:30 04/16/2016 19:58 Full Code [id number]  Marinus Maw, MD Inpatient   08/05/2015 00:41 08/11/2015  19:15 Full Code [id number]  Laurey Morale, MD Inpatient   07/30/2015 17:44 08/03/2015 17:25 Full Code [id number]  Ellsworth Lennox, PA Inpatient   07/18/2015 16:50 07/28/2015 20:50 Full Code [id number]  Laurann Montana, PA-C Inpatient   04/09/2014 15:50 04/10/2014 10:24 Full Code [id number]  Glendora Score Inpatient   01/11/2012 11:32 01/12/2012 19:49 Full Code [id number]  Toniann Ket, RN Inpatient   01/08/2012 21:05 01/11/2012 11:32 Full Code [id number]  Alonza Bogus, RN ED    Advance Directive Documentation     Most Recent Value  Type of Advance Directive  Healthcare Power of Attorney  Pre-existing out of facility DNR order (yellow form or pink MOST form)  No data  "MOST" Form in Place?  No data       Prognosis:   Hours - Days  Discharge Planning:  Anticipated Hospital Death  Care plan was discussed with  Daughter, son, SIL, RN  Thank you for allowing the Palliative Medicine Team to assist in the care of this patient.   Time In: 0910 Time Out: 0935 Total Time Prolonged Time Billed  no       Greater than 50%  of this time was spent counseling and coordinating care related to the above assessment and plan.  Vennie Homans, FNP-C Palliative Medicine Team  Phone: [phone] Fax: [phone]  Please contact Palliative Medicine Team phone at [phone] for questions and concerns.

## 2017-08-17 NOTE — Care Management Note (Signed)
Case Management Note  Patient Details  Name: Danielle Benitez MRN: [id number] Date of Birth: Feb 07, 1932  Subjective/Objective:             Patient is comfort care. Per Palliative note today, they anticipate hours to days and state that patient is not stable for transport to residential hospice. Patient is experiencing shallow respirations with periods of apnea. Hospital death expected. CM signing off.        Action/Plan:   Expected Discharge Date:                  Expected Discharge Plan:  (hospital death expected)  In-House Referral:     Discharge planning Services  CM Consult  Post Acute Care Choice:    Choice offered to:     DME Arranged:    DME Agency:     HH Arranged:    HH Agency:     Status of Service:  Completed, signed off  If discussed at [employer] of Stay Meetings, dates discussed:    Additional Comments:  Lawerance Sabal, RN 08/17/2017, 1:09 PM

## 2017-08-17 NOTE — Progress Notes (Addendum)
[name] family came into hallway to tell nurse about pt having gagging sounds and wheezing. Nurse assessed pt and noticed new onset of wheezing in pt upper right lobe. Nurse also noticed gagging from patient. Nurse Texted paged [facility]. Will cont. To monitor and keep pt as comfortable as possible.

## 2017-08-18 ENCOUNTER — Other Ambulatory Visit: Payer: Self-pay

## 2017-08-18 DIAGNOSIS — L8990 Pressure ulcer of unspecified site, unspecified stage: Secondary | ICD-10-CM

## 2017-08-18 LAB — GLUCOSE, CAPILLARY: GLUCOSE-CAPILLARY: 170 mg/dL — AB (ref 65–99)

## 2017-08-18 NOTE — Progress Notes (Signed)
PROGRESS NOTE    Danielle Benitez  WUJ:[id number] DOB: January 08, 1932 DOA: 08/01/2017 PCP: Joaquim Nam, MD   Brief Narrative:  Danielle Benitez is a 83 y.o. female with a medical history of insulin-dependent diabetes mellitus, hypertension, history of CVA, fibrillation on Eliquis, chronic diastolic heart failure, chronic kidney disease stage IV.  She presented with severe left wrist pain found to have a left wrist fracture.  Orthopedic surgery was consulted and opted for nonoperative management.  Patient developed oliguric AKI and found to have cirrhosis. Patient declined fairly quickly and Palliative Care Consulted and patient now Comfort Care. High Risk for Decompensation and In Hospital Death is expected.   Assessment & Plan:   Principal Problem:   Left wrist fracture, closed, initial encounter Active Problems:   DM, type 2, with renal complications    Mild CAD - 20-30% RCA in 2013   Depression   CKD (chronic kidney disease), stage IV (HCC)   Essential hypertension   Normocytic anemia   Atrial fibrillation - 07/11/15 by PPM interrogation, recurrent 10/19 after DCCV 10/14   Thrombocytopenia (HCC)   Chronic diastolic CHF (congestive heart failure) (HCC)   History of CVA (cerebrovascular accident)   Occult GI bleeding   HCAP (healthcare-associated pneumonia)   Closed fracture of left wrist   Fall   Palliative care by specialist   Goals of care, counseling/discussion   Terminal care   Pressure injury of skin  Left wrist fracture -Closed fracture. Pain improved. Comfort care order set utilized and patient .  HCAP -Blood cultures negative to date.  -Sputum gram stain/culture not available. -Discontinue Cefepime -Comfort care  Acute metabolic encephalopathy -Appears secondary to uremia. Will likely not improve. -Comfort care  Diabetes mellitus, type 2 -Discontinued SSI  CAD -20-30% RCA -Discontinue atorvastatin -Comfort care  Depression -Discontinue Wellbutrin -Comfort  care  Acute kidney injury on CKD stage IV -Worsening signs of uremia with mental status changes. -Nephrology signed off. Patient transitioned to comfort care.  Essential hypertension -Discontinued metoprolol secondary to hypotension  Normocytic anemia -Last Hb/Hct was 9.1/28.2 -Will not Repeat Labs as patient is Comfort Care  Atrial fibrillation -Patient with PPM. -Discontinue eliquis and amiodarone -Discontinue telemetry  Thrombocytopenia -Chronic. Stabilized. Unknown etiology. Chart review shows no workup. No spontaneous bleeding.  -Discussed with hematology and recommended to continue Eliquis at current dose. Likely contributed to be new diagnosis of cirrhosis and worsened likely by iatrogenic means. Spleen normal. Comfort measures enacted so Eliquis discontinued   Chronic diastolic heart failure -Last echo from 04/2017 significant for an EF of 60-65% with grade 1 diastolic dysfunction  History of CVA -Discontinue Eliquis secondary to comfort measures  History of dysphagia Emesis -Patient with an EGD back in 2016 significant for dysmotility. -Dysphagia 3 diet recommended at that time. Now with symptoms of emesis with oral intake. Improved with Zofran. This appears to be contributing to her overall recent weight loss. Esophagram significant for esophageal dysmotility. Unsure if this is related to uremia since it has been present for the last few weeks and doubt secondary to narcotics for the same reason but appears to be worsened in current setting. Small bowel movement. No history of gastroparesis. Episode yesterday. -Reglan and Zofran scheduled -Comfort Measures   Cirrhosis As seen on ultrasound. Likely causing thrombocytopenia. Unknown etiology. Hepatitis panel negative. -Will not work up further now that patient is Comfort Measures  DVT prophylaxis: None. Patient is Comfort Care Code Status: DO NOT RESUSCITATE  Family Communication: Discussed with Family at  Bedside Disposition Plan: Anticipate In Hospital Death   Consultants:   Palliative Care Medicine   Orthopedic Surgery  Procedures: None   Antimicrobials:  Anti-infectives (From admission, onward)   Start     Dose/Rate Route Frequency Ordered Stop   08/10/17 0600  vancomycin (VANCOCIN) 500 mg in sodium chloride 0.9 % 100 mL IVPB  Status:  Discontinued     500 mg 100 mL/hr over 60 Minutes Intravenous Every 48 hours 08/08/17 0349 08/08/17 1048   08/10/17 0200  vancomycin (VANCOCIN) IVPB 1000 mg/200 mL premix  Status:  Discontinued     1,000 mg 200 mL/hr over 60 Minutes Intravenous Every 48 hours 08/08/17 1048 08/12/17 1206   08/08/17 2200  ceFEPIme (MAXIPIME) 1 g in dextrose 5 % 50 mL IVPB  Status:  Discontinued     1 g 100 mL/hr over 30 Minutes Intravenous Every 24 hours 08/08/17 0253 08/15/17 0928   08/08/17 0045  vancomycin (VANCOCIN) IVPB 1000 mg/200 mL premix     1,000 mg 200 mL/hr over 60 Minutes Intravenous  Once 08/08/17 0035 08/08/17 0312   08/08/17 0015  ceFEPIme (MAXIPIME) 2 g in dextrose 5 % 50 mL IVPB     2 g 100 mL/hr over 30 Minutes Intravenous  Once 08/08/17 0006 08/08/17 0202   07/25/2017 2345  cefTRIAXone (ROCEPHIN) 1 g in dextrose 5 % 50 mL IVPB  Status:  Discontinued     1 g 100 mL/hr over 30 Minutes Intravenous  Once 07/13/2017 2342 08/08/17 0006   07/15/2017 2345  azithromycin (ZITHROMAX) 500 mg in dextrose 5 % 250 mL IVPB  Status:  Discontinued     500 mg 250 mL/hr over 60 Minutes Intravenous  Once 07/20/2017 2342 08/08/17 0006     Subjective: Seen and examined and did not awaken as she was resting comfortably. Daughter states she had a better night and was not as restless. Daughter removed SCD's because she thought it was bothering her mother. Per daughter mother would awake slightly and open her eyes and seem as if she was in a "daze."   Objective: Vitals:   08/16/17 0500 08/16/17 2349 08/17/17 0548 08/18/17 0455  BP: (!) 104/43 (!) 123/35 (!) 104/26 (!)  124/28  Pulse: 69 70 70 70  Resp:   10 (!) 8  Temp: 98 F (36.7 C)  98.6 F (37 C)   TempSrc: Axillary  Axillary   SpO2: 97% 96% 96% 97%  Weight:      Height:       No intake or output data in the 24 hours ending 08/18/17 1731 Filed Weights   07/11/2017 2110  Weight: 80.8 kg (178 lb 3.2 oz)   Examination: Physical Exam:  Constitutional: WN/WD obese Caucasian female resting comfortably in NAD Eyes: Did not open eyes for the encounter. Lids appear normal. ENMT: External Ears and Nose appear normal Neck: No appreciable JVD Respiratory: Diminished with some mild crackles with Right worse than Left. Shallow breathing but not labored Cardiovascular: RRR; No m/r/g; no appreciable LE Edema Abdomen: Soft, NT, ND. Bowel sounds present GU: Deferred Musculoskeletal: No clubbing or cyanosis Skin: Has some LE bruising. No rashes or lesions on a limited skin eval Neurologic: Not awake or alert. Does not respond to physical or verbal stimuli Psychiatric: Unable to assess due to current condition.  Data Reviewed: I have personally reviewed following labs and imaging studies  CBC: Recent Labs  Lab 08/12/17 0352 08/13/17 0612 08/14/17 0841 08/15/17 0339  WBC 10.2 9.9 9.6 10.2  HGB 8.7* 8.5* 8.8* 9.1*  HCT 27.6* 26.9* 27.5* 28.2*  MCV 94.8 95.7 94.5 95.3  PLT 52* 45* 52* 55*   Basic Metabolic Panel: Recent Labs  Lab 08/12/17 0352 08/13/17 0612 08/13/17 0834 08/14/17 0536 08/15/17 0339  NA 136 134*  --  133* 134*  K 3.9 4.6  --  4.0 4.0  CL 103 104  --  103 104  CO2 20* 18*  --  18* 19*  GLUCOSE 99 158*  --  138* 117*  BUN 76* 74*  --  74* 80*  CREATININE 4.22* 4.12*  --  4.29* 4.40*  CALCIUM 8.2* 7.9*  --  8.1* 8.2*  PHOS  --   --  5.2* 5.1* 5.2*   GFR: Estimated Creatinine Clearance: 9.5 mL/min (A) (by C-G formula based on SCr of 4.4 mg/dL (H)). Liver Function Tests: Recent Labs  Lab 08/13/17 0612 08/14/17 0536 08/15/17 0339  AST 73*  --   --   ALT 28  --   --     ALKPHOS 190*  --   --   BILITOT 2.3*  --   --   PROT 5.8*  --   --   ALBUMIN 1.6* 1.6* 1.6*   Recent Labs  Lab 08/13/17 0612  LIPASE 20   No results for input(s): AMMONIA in the last 168 hours. Coagulation Profile: No results for input(s): INR, PROTIME in the last 168 hours. Cardiac Enzymes: No results for input(s): CKTOTAL, CKMB, CKMBINDEX, TROPONINI in the last 168 hours. BNP (last 3 results) Recent Labs    09/20/16 1458 03/16/17 1441 04/11/17 1345  PROBNP 140.0* 287.0* 215.0*   HbA1C: No results for input(s): HGBA1C in the last 72 hours. CBG: Recent Labs  Lab 08/15/17 0805 08/16/17 0502 08/16/17 2353 08/17/17 0546 08/18/17 0456  GLUCAP 81 117* 164* 166* 170*   Lipid Profile: No results for input(s): CHOL, HDL, LDLCALC, TRIG, CHOLHDL, LDLDIRECT in the last 72 hours. Thyroid Function Tests: No results for input(s): TSH, T4TOTAL, FREET4, T3FREE, THYROIDAB in the last 72 hours. Anemia Panel: No results for input(s): VITAMINB12, FOLATE, FERRITIN, TIBC, IRON, RETICCTPCT in the last 72 hours. Sepsis Labs: No results for input(s): PROCALCITON, LATICACIDVEN in the last 168 hours.  Recent Results (from the past 240 hour(s))  MRSA PCR Screening     Status: None   Collection Time: 08/11/17 11:48 AM  Result Value Ref Range Status   MRSA by PCR NEGATIVE NEGATIVE Final    Comment:        The GeneXpert MRSA Assay (FDA approved for NASAL specimens only), is one component of a comprehensive MRSA colonization surveillance program. It is not intended to diagnose MRSA infection nor to guide or monitor treatment for MRSA infections.     Radiology Studies: No results found.   Scheduled Meds: . mouth rinse  15 mL Mouth Rinse BID  . metoCLOPramide (REGLAN) injection  5 mg Intravenous Q12H  . ondansetron (ZOFRAN) IV  4 mg Intravenous Q8H  . sodium chloride flush  10-40 mL Intracatheter Q12H  . sodium chloride flush  3 mL Intravenous Q12H  . sodium chloride flush  3 mL  Intravenous Q12H   Continuous Infusions: . sodium chloride    . dextrose 5 % and 0.45% NaCl 20 mL/hr (08/15/17 1045)    LOS: 10 days   Merlene Laughter, DO [facility] Pager [phone]  If 7PM-7AM, please contact night-coverage [email or web address] Password [facility] 08/18/2017, 5:31 PM

## 2017-08-18 NOTE — Care Management Important Message (Signed)
Important Message  Patient Details  Name: Danielle Benitez MRN: [id number] Date of Birth: 1932/02/05   Medicare Important Message Given:  Yes    Kyla Balzarine 08/18/2017, 12:59 PM

## 2017-08-18 NOTE — Progress Notes (Signed)
PT Cancellation Note  Patient Details Name: Danielle Benitez MRN: [id number] DOB: 05/02/1932   Cancelled Treatment:     Pt not visited by PT today. Per palliative care pt actively dying within hours-days. Patient is with family requesting privacy today. PT to sign off.   Etta Grandchild, PT, DPT Acute Rehab Services Pager: [phone]     Etta Grandchild 08/18/2017, 4:44 PM

## 2017-08-19 LAB — GLUCOSE, CAPILLARY: GLUCOSE-CAPILLARY: 165 mg/dL — AB (ref 65–99)

## 2017-08-19 MED ORDER — KETOTIFEN FUMARATE 0.025 % OP SOLN
1.0000 [drp] | Freq: Two times a day (BID) | OPHTHALMIC | Status: DC | PRN
  Administered 2017-08-20: 1 [drp] via OPHTHALMIC
  Filled 2017-08-19: qty 5

## 2017-08-19 NOTE — Progress Notes (Signed)
Nutrition Brief Note  Pt identified on the </= 12 braden score report. Goal of care is comfort; anticipated hospital death. No nutrition interventions warranted at this time.  Please consult as needed.   Maureen Chatters, RD, LDN Pager #: [phone] After-Hours Pager #: [phone]

## 2017-08-19 NOTE — Progress Notes (Signed)
PROGRESS NOTE    Danielle GraceLula V Burgo  ZOX:[id number] DOB: 02/22/1932 DOA: 07/20/2017 PCP: Joaquim Namuncan, [name], MD   Brief Narrative:  Danielle Benitez is a 83 y.o. female with a medical history of insulin-dependent diabetes mellitus, hypertension, history of CVA, fibrillation on Eliquis, chronic diastolic heart failure, chronic kidney disease stage IV.  She presented with severe left wrist pain found to have a left wrist fracture.  Orthopedic surgery was consulted and opted for nonoperative management.  Patient developed oliguric AKI and found to have cirrhosis. Patient declined fairly quickly and Palliative Care Consulted and patient now Comfort Care. High Risk for Decompensation and In Hospital Death is expected.   Assessment & Plan:   Principal Problem:   Left wrist fracture, closed, initial encounter Active Problems:   DM, type 2, with renal complications    Mild CAD - 20-30% RCA in 2013   Depression   CKD (chronic kidney disease), stage IV (HCC)   Essential hypertension   Normocytic anemia   Atrial fibrillation - 07/11/15 by PPM interrogation, recurrent 10/19 after DCCV 10/14   Thrombocytopenia (HCC)   Chronic diastolic CHF (congestive heart failure) (HCC)   History of CVA (cerebrovascular accident)   Occult GI bleeding   HCAP (healthcare-associated pneumonia)   Closed fracture of left wrist   Fall   Palliative care by specialist   Goals of care, counseling/discussion   Terminal care   Pressure injury of skin  Left wrist fracture -Closed fracture. Pain improved. Comfort care order set utilized and patient. -Patient's daughter concerned about wrist splint/ACE Bandage causing bruising. Discussed with Dr. Jena GaussHaddix and he will evaluate and may remove and current brace.   HCAP -Blood cultures negative to date.  -Sputum gram stain/culture not available. -Discontinue Cefepime -Comfort Care  Acute metabolic encephalopathy -Appears secondary to uremia. Will likely not improve. -Comfort  care  Diabetes mellitus, type 2 -Discontinued SSI  CAD -20-30% RCA -Discontinue atorvastatin -Comfort care  Depression -Discontinue Wellbutrin -Comfort care  Acute kidney injury on CKD stage IV -Worsening signs of uremia with mental status changes. -Nephrology signed off. Patient transitioned to comfort care.  Essential hypertension -Discontinued metoprolol secondary to hypotension  Normocytic anemia -Last Hb/Hct was 9.1/28.2 -Will not Repeat Labs as patient is Comfort Care  Atrial Fibrillation -Patient with PPM. -Discontinue eliquis and amiodarone -Discontinue telemetry  Thrombocytopenia -Chronic. Unknown etiology. Chart review shows no workup. No spontaneous bleeding however has some bruising now -Discussed with hematology and recommended to continue Eliquis at current dose. Likely contributed to be new diagnosis of cirrhosis and worsened likely by iatrogenic means. Spleen normal. Comfort measures enacted so Eliquis discontinued   Chronic diastolic heart failure -Last echo from 04/2017 significant for an EF of 60-65% with grade 1 diastolic dysfunction  History of CVA -Discontinue Eliquis secondary to comfort measures  History of dysphagia Emesis -Patient with an EGD back in 2016 significant for dysmotility. -Dysphagia 3 diet recommended at that time. Now with symptoms of emesis with oral intake. Improved with Zofran. This appears to be contributing to her overall recent weight loss. Esophagram significant for esophageal dysmotility. Unsure if this is related to uremia since it has been present for the last few weeks and doubt secondary to narcotics for the same reason but appears to be worsened in current setting. Small bowel movement. No history of gastroparesis. Episode yesterday. -Reglan and Zofran scheduled -Comfort Measures   Cirrhosis As seen on ultrasound. Likely causing thrombocytopenia. Unknown etiology. Hepatitis panel negative. -Will not work up  further  now that patient is Comfort Measures  DVT prophylaxis: None. Patient is Comfort Care Code Status: DO NOT RESUSCITATE  Family Communication: Discussed with Family at Bedside Disposition Plan: Anticipate In Hospital Death   Consultants:   Palliative Care Medicine   Orthopedic Surgery  Procedures: None   Antimicrobials:  Anti-infectives (From admission, onward)   Start     Dose/Rate Route Frequency Ordered Stop   08/10/17 0600  vancomycin (VANCOCIN) 500 mg in sodium chloride 0.9 % 100 mL IVPB  Status:  Discontinued     500 mg 100 mL/hr over 60 Minutes Intravenous Every 48 hours 08/08/17 0349 08/08/17 1048   08/10/17 0200  vancomycin (VANCOCIN) IVPB 1000 mg/200 mL premix  Status:  Discontinued     1,000 mg 200 mL/hr over 60 Minutes Intravenous Every 48 hours 08/08/17 1048 08/12/17 1206   08/08/17 2200  ceFEPIme (MAXIPIME) 1 g in dextrose 5 % 50 mL IVPB  Status:  Discontinued     1 g 100 mL/hr over 30 Minutes Intravenous Every 24 hours 08/08/17 0253 08/15/17 0928   08/08/17 0045  vancomycin (VANCOCIN) IVPB 1000 mg/200 mL premix     1,000 mg 200 mL/hr over 60 Minutes Intravenous  Once 08/08/17 0035 08/08/17 0312   08/08/17 0015  ceFEPIme (MAXIPIME) 2 g in dextrose 5 % 50 mL IVPB     2 g 100 mL/hr over 30 Minutes Intravenous  Once 08/08/17 0006 08/08/17 0202   07/15/2017 2345  cefTRIAXone (ROCEPHIN) 1 g in dextrose 5 % 50 mL IVPB  Status:  Discontinued     1 g 100 mL/hr over 30 Minutes Intravenous  Once 07/30/2017 2342 08/08/17 0006   08/06/2017 2345  azithromycin (ZITHROMAX) 500 mg in dextrose 5 % 250 mL IVPB  Status:  Discontinued     500 mg 250 mL/hr over 60 Minutes Intravenous  Once 07/25/2017 2342 08/08/17 0006     Subjective: Seen and examined and was not responsive to physical and verbal stimuli. Resting comfortably. Family stated she had a restful night. Daughter was concerned about bruising and wanted wrist brace that went up to the elbow to be evaluated. No other  complaints.   Objective: Vitals:   08/16/17 2349 08/17/17 0548 08/18/17 0455 08/19/17 0625  BP: (!) 123/35 (!) 104/26 (!) 124/28 (!) 106/26  Pulse: 70 70 70 70  Resp:  10 (!) 8   Temp:  98.6 F (37 C)    TempSrc:  Axillary    SpO2: 96% 96% 97% 93%  Weight:      Height:       No intake or output data in the 24 hours ending 08/19/17 1646 Filed Weights   08/03/2017 2110  Weight: 80.8 kg (178 lb 3.2 oz)   Examination: Physical Exam:  Constitutional: WN/WD obese Caucasian female resting comfortably  Eyes: Would not open eyes during the encounter ENMT: External Ears and Nose appear normal Neck: No appreciable JVD Respiratory: Diminished with some mild crackles with Right worse than Left. Breathing unlabored and not as shallow as yesterday Cardiovascular: RRR; No appreciable m/r/g. No LE Edema Abdomen: Soft, NT, ND. Bowel sounds present GU: Deferred Musculoskeletal: No clubbing or appreciable cyanosis Skin: Has lower extremity and upper extremity ecchymosis with Left Leg worse than Right Leg. No rashes appreciated  Neurologic: Not awake or alert. Resting and did not respond to physical or verbal stimuli Psychiatric: Unable to assess due to current condition.  Data Reviewed: I have personally reviewed following labs and imaging studies  CBC: Recent  Labs  Lab 08/13/17 0612 08/14/17 0841 08/15/17 0339  WBC 9.9 9.6 10.2  HGB 8.5* 8.8* 9.1*  HCT 26.9* 27.5* 28.2*  MCV 95.7 94.5 95.3  PLT 45* 52* 55*   Basic Metabolic Panel: Recent Labs  Lab 08/13/17 0612 08/13/17 0834 08/14/17 0536 08/15/17 0339  NA 134*  --  133* 134*  K 4.6  --  4.0 4.0  CL 104  --  103 104  CO2 18*  --  18* 19*  GLUCOSE 158*  --  138* 117*  BUN 74*  --  74* 80*  CREATININE 4.12*  --  4.29* 4.40*  CALCIUM 7.9*  --  8.1* 8.2*  PHOS  --  5.2* 5.1* 5.2*   GFR: Estimated Creatinine Clearance: 9.5 mL/min (A) (by C-G formula based on SCr of 4.4 mg/dL (H)). Liver Function Tests: Recent Labs  Lab  08/13/17 0612 08/14/17 0536 08/15/17 0339  AST 73*  --   --   ALT 28  --   --   ALKPHOS 190*  --   --   BILITOT 2.3*  --   --   PROT 5.8*  --   --   ALBUMIN 1.6* 1.6* 1.6*   Recent Labs  Lab 08/13/17 0612  LIPASE 20   No results for input(s): AMMONIA in the last 168 hours. Coagulation Profile: No results for input(s): INR, PROTIME in the last 168 hours. Cardiac Enzymes: No results for input(s): CKTOTAL, CKMB, CKMBINDEX, TROPONINI in the last 168 hours. BNP (last 3 results) Recent Labs    09/20/16 1458 03/16/17 1441 04/11/17 1345  PROBNP 140.0* 287.0* 215.0*   HbA1C: No results for input(s): HGBA1C in the last 72 hours. CBG: Recent Labs  Lab 08/16/17 0502 08/16/17 2353 08/17/17 0546 08/18/17 0456 08/19/17 0640  GLUCAP 117* 164* 166* 170* 165*   Lipid Profile: No results for input(s): CHOL, HDL, LDLCALC, TRIG, CHOLHDL, LDLDIRECT in the last 72 hours. Thyroid Function Tests: No results for input(s): TSH, T4TOTAL, FREET4, T3FREE, THYROIDAB in the last 72 hours. Anemia Panel: No results for input(s): VITAMINB12, FOLATE, FERRITIN, TIBC, IRON, RETICCTPCT in the last 72 hours. Sepsis Labs: No results for input(s): PROCALCITON, LATICACIDVEN in the last 168 hours.  Recent Results (from the past 240 hour(s))  MRSA PCR Screening     Status: None   Collection Time: 08/11/17 11:48 AM  Result Value Ref Range Status   MRSA by PCR NEGATIVE NEGATIVE Final    Comment:        The GeneXpert MRSA Assay (FDA approved for NASAL specimens only), is one component of a comprehensive MRSA colonization surveillance program. It is not intended to diagnose MRSA infection nor to guide or monitor treatment for MRSA infections.     Radiology Studies: No results found.   Scheduled Meds: . mouth rinse  15 mL Mouth Rinse BID  . metoCLOPramide (REGLAN) injection  5 mg Intravenous Q12H  . ondansetron (ZOFRAN) IV  4 mg Intravenous Q8H  . sodium chloride flush  10-40 mL Intracatheter  Q12H  . sodium chloride flush  3 mL Intravenous Q12H  . sodium chloride flush  3 mL Intravenous Q12H   Continuous Infusions: . sodium chloride    . dextrose 5 % and 0.45% NaCl 20 mL/hr (08/15/17 1045)    LOS: 11 days   Merlene Laughter, DO [facility] Pager [phone]  If 7PM-7AM, please contact night-coverage [email or web address] Password [facility] 08/19/2017, 4:46 PM

## 2017-08-20 ENCOUNTER — Other Ambulatory Visit: Payer: Self-pay

## 2017-08-20 DIAGNOSIS — W19XXXS Unspecified fall, sequela: Secondary | ICD-10-CM

## 2017-08-20 LAB — GLUCOSE, CAPILLARY
GLUCOSE-CAPILLARY: 176 mg/dL — AB (ref 65–99)
Glucose-Capillary: 166 mg/dL — ABNORMAL HIGH (ref 65–99)

## 2017-08-20 MED ORDER — MORPHINE SULFATE (PF) 4 MG/ML IV SOLN
1.0000 mg | Freq: Four times a day (QID) | INTRAVENOUS | Status: AC
  Administered 2017-08-20 – 2017-08-21 (×3): 1 mg via INTRAVENOUS
  Filled 2017-08-20 (×4): qty 1

## 2017-08-20 MED ORDER — MORPHINE SULFATE (PF) 4 MG/ML IV SOLN
1.0000 mg | INTRAVENOUS | Status: DC | PRN
  Administered 2017-08-20: 1 mg via INTRAVENOUS
  Filled 2017-08-20: qty 1

## 2017-08-20 NOTE — Progress Notes (Signed)
PROGRESS NOTE    Danielle GraceLula V Benitez  WUJ:[id number] DOB: 1932-04-17 DOA: 2017-07-11 PCP: Joaquim Namuncan, [name], MD   Brief Narrative:  Danielle Benitez is a 83 y.o. female with a medical history of insulin-dependent diabetes mellitus, hypertension, history of CVA, fibrillation on Eliquis, chronic diastolic heart failure, chronic kidney disease stage IV.  She presented with severe left wrist pain found to have a left wrist fracture.  Orthopedic surgery was consulted and opted for nonoperative management.  Patient developed oliguric AKI and found to have cirrhosis. Patient declined fairly quickly and Palliative Care Consulted and patient now Comfort Care. All medications not related to patient's comfort have been discontinued. High Risk for Decompensation and In Hospital Death is expected.   Assessment & Plan:   Principal Problem:   Left wrist fracture, closed, initial encounter Active Problems:   DM, type 2, with renal complications    Mild CAD - 20-30% RCA in 2013   Depression   CKD (chronic kidney disease), stage IV (HCC)   Essential hypertension   Normocytic anemia   Atrial fibrillation - 07/11/15 by PPM interrogation, recurrent 10/19 after DCCV 10/14   Thrombocytopenia (HCC)   Chronic diastolic CHF (congestive heart failure) (HCC)   History of CVA (cerebrovascular accident)   Occult GI bleeding   HCAP (healthcare-associated pneumonia)   Closed fracture of left wrist   Fall   Palliative care by specialist   Goals of care, counseling/discussion   Terminal care   Pressure injury of skin  Left wrist fracture -Closed fracture. Pain improved. Comfort care order set utilized and patient. -Patient's daughter concerned about wrist splint/ACE Bandage causing bruising. Discussed with Dr. Jena GaussHaddix and he will evaluate and may remove and current brace.  -Dr. Jena GaussHaddix removed arm cast and placed on velcro wrist splinit  HCAP -Blood cultures negative to date.  -Sputum gram stain/culture not  available. -Discontinue Cefepime -Comfort Care; Palliative Care scheduling morphine overnight and recommending continuing PRN Doses.   Acute metabolic encephalopathy -Appears secondary to uremia. Will likely not improve. -Comfort care  Diabetes mellitus, type 2 -Discontinued SSI  CAD -20-30% RCA -Discontinue atorvastatin -Comfort care  Depression -Discontinue Wellbutrin -Comfort care  Acute kidney injury on CKD stage IV -Worsening signs of uremia with mental status changes. -Nephrology signed off. Patient transitioned to comfort care. -KVO Fluid's D/C'd   Essential hypertension -Discontinued metoprolol secondary to hypotension  Normocytic anemia -Last Hb/Hct was 9.1/28.2 -Will not Repeat Labs as patient is Comfort Care  Atrial Fibrillation -Patient with PPM. -Discontinue eliquis and amiodarone -Discontinue Telemetry  Thrombocytopenia -Chronic. Unknown etiology. Chart review shows no workup. No spontaneous bleeding however has some bruising now -Discussed with hematology and recommended to continue Eliquis at current dose. Likely contributed to be new diagnosis of cirrhosis and worsened likely by iatrogenic means. Spleen normal. Comfort measures enacted so Eliquis discontinued   Chronic diastolic heart failure -Last echo from 04/2017 significant for an EF of 60-65% with grade 1 diastolic dysfunction  History of CVA -Discontinue Eliquis secondary to comfort measures  History of dysphagia Emesis -Patient with an EGD back in 2016 significant for dysmotility. -Dysphagia 3 diet recommended at that time. Now with symptoms of emesis with oral intake. Improved with Zofran. This appears to be contributing to her overall recent weight loss. Esophagram significant for esophageal dysmotility. Unsure if this is related to uremia since it has been present for the last few weeks and doubt secondary to narcotics for the same reason but appears to be worsened in current  setting. Small bowel movement. No history of gastroparesis. Episode yesterday. -Reglan and Zofran scheduled -Comfort Measures   Cirrhosis As seen on ultrasound. Likely causing thrombocytopenia. Unknown etiology. Hepatitis panel negative. -Will not work up further now that patient is Comfort Measures  DVT prophylaxis: None. Patient is Comfort Care Code Status: DO NOT RESUSCITATE  Family Communication: Discussed with Family at Bedside Disposition Plan: Anticipate In Hospital Death and is Unstable for Transfer  Consultants:   Palliative Care Medicine   Orthopedic Surgery  Procedures: None   Antimicrobials:  Anti-infectives (From admission, onward)   Start     Dose/Rate Route Frequency Ordered Stop   08/10/17 0600  vancomycin (VANCOCIN) 500 mg in sodium chloride 0.9 % 100 mL IVPB  Status:  Discontinued     500 mg 100 mL/hr over 60 Minutes Intravenous Every 48 hours 08/08/17 0349 08/08/17 1048   08/10/17 0200  vancomycin (VANCOCIN) IVPB 1000 mg/200 mL premix  Status:  Discontinued     1,000 mg 200 mL/hr over 60 Minutes Intravenous Every 48 hours 08/08/17 1048 08/12/17 1206   08/08/17 2200  ceFEPIme (MAXIPIME) 1 g in dextrose 5 % 50 mL IVPB  Status:  Discontinued     1 g 100 mL/hr over 30 Minutes Intravenous Every 24 hours 08/08/17 0253 08/15/17 0928   08/08/17 0045  vancomycin (VANCOCIN) IVPB 1000 mg/200 mL premix     1,000 mg 200 mL/hr over 60 Minutes Intravenous  Once 08/08/17 0035 08/08/17 0312   08/08/17 0015  ceFEPIme (MAXIPIME) 2 g in dextrose 5 % 50 mL IVPB     2 g 100 mL/hr over 30 Minutes Intravenous  Once 08/08/17 0006 08/08/17 0202   08-14-17 2345  cefTRIAXone (ROCEPHIN) 1 g in dextrose 5 % 50 mL IVPB  Status:  Discontinued     1 g 100 mL/hr over 30 Minutes Intravenous  Once 08-14-2017 2342 08/08/17 0006   2017-08-14 2345  azithromycin (ZITHROMAX) 500 mg in dextrose 5 % 250 mL IVPB  Status:  Discontinued     500 mg 250 mL/hr over 60 Minutes Intravenous  Once 08/14/17  2342 08/08/17 0006     Subjective: Seen and examined and was not awake or responsive. Daughter states she had some restlessness that was improved with morphine. No other concerns or complaints at this time.   Objective: Vitals:   08/17/17 0548 08/18/17 0455 08/19/17 0625 08/20/17 0602  BP: (!) 104/26 (!) 124/28 (!) 106/26 (!) 113/17  Pulse: 70 70 70 72  Resp: 10 (!) 8  (!) 8  Temp: 98.6 F (37 C)   97.9 F (36.6 C)  TempSrc: Axillary   Axillary  SpO2: 96% 97% 93% 97%  Weight:      Height:        Intake/Output Summary (Last 24 hours) at 08/20/2017 1538 Last data filed at 08/19/2017 2211 Gross per 24 hour  Intake 13 ml  Output -  Net 13 ml   Filed Weights   08-14-17 2110  Weight: 80.8 kg (178 lb 3.2 oz)   Examination: Physical Exam:  Constitutional: WN/WD obese Caucasian female in NAD; resting comfortably Eyes: Did not open eyes during the encounter ENMT: External Ears and nose appear normal Neck: No appreciable JVD Respiratory: Shallow breathing with diminished breaths sounds. Crackles auscultated Cardiovascular: RRR Abdomen: Soft, NT, ND. Bowel sounds present GU: Deferred Musculoskeletal: No clubbing. Started to have some mottling of skin Skin: Has lower ecchymosis on Left worse than right.  Neurologic: Not awake or alert. Not responsive to  physical or verbal stimuli Psychiatric: Unable to assess due to current condition.   Data Reviewed: I have personally reviewed following labs and imaging studies  CBC: Recent Labs  Lab 08/14/17 0841 08/15/17 0339  WBC 9.6 10.2  HGB 8.8* 9.1*  HCT 27.5* 28.2*  MCV 94.5 95.3  PLT 52* 55*   Basic Metabolic Panel: Recent Labs  Lab 08/14/17 0536 08/15/17 0339  NA 133* 134*  K 4.0 4.0  CL 103 104  CO2 18* 19*  GLUCOSE 138* 117*  BUN 74* 80*  CREATININE 4.29* 4.40*  CALCIUM 8.1* 8.2*  PHOS 5.1* 5.2*   GFR: Estimated Creatinine Clearance: 9.5 mL/min (A) (by C-G formula based on SCr of 4.4 mg/dL (H)). Liver  Function Tests: Recent Labs  Lab 08/14/17 0536 08/15/17 0339  ALBUMIN 1.6* 1.6*   No results for input(s): LIPASE, AMYLASE in the last 168 hours. No results for input(s): AMMONIA in the last 168 hours. Coagulation Profile: No results for input(s): INR, PROTIME in the last 168 hours. Cardiac Enzymes: No results for input(s): CKTOTAL, CKMB, CKMBINDEX, TROPONINI in the last 168 hours. BNP (last 3 results) Recent Labs    09/20/16 1458 03/16/17 1441 04/11/17 1345  PROBNP 140.0* 287.0* 215.0*   HbA1C: No results for input(s): HGBA1C in the last 72 hours. CBG: Recent Labs  Lab 08/17/17 0546 08/18/17 0456 08/19/17 0640 08/20/17 0555 08/20/17 1214  GLUCAP 166* 170* 165* 166* 176*   Lipid Profile: No results for input(s): CHOL, HDL, LDLCALC, TRIG, CHOLHDL, LDLDIRECT in the last 72 hours. Thyroid Function Tests: No results for input(s): TSH, T4TOTAL, FREET4, T3FREE, THYROIDAB in the last 72 hours. Anemia Panel: No results for input(s): VITAMINB12, FOLATE, FERRITIN, TIBC, IRON, RETICCTPCT in the last 72 hours. Sepsis Labs: No results for input(s): PROCALCITON, LATICACIDVEN in the last 168 hours.  Recent Results (from the past 240 hour(s))  MRSA PCR Screening     Status: None   Collection Time: 08/11/17 11:48 AM  Result Value Ref Range Status   MRSA by PCR NEGATIVE NEGATIVE Final    Comment:        The GeneXpert MRSA Assay (FDA approved for NASAL specimens only), is one component of a comprehensive MRSA colonization surveillance program. It is not intended to diagnose MRSA infection nor to guide or monitor treatment for MRSA infections.     Radiology Studies: No results found.   Scheduled Meds: . mouth rinse  15 mL Mouth Rinse BID  . metoCLOPramide (REGLAN) injection  5 mg Intravenous Q12H  .  morphine injection  1 mg Intravenous Q6H  . ondansetron (ZOFRAN) IV  4 mg Intravenous Q8H  . sodium chloride flush  10-40 mL Intracatheter Q12H  . sodium chloride flush  3  mL Intravenous Q12H  . sodium chloride flush  3 mL Intravenous Q12H   Continuous Infusions: . sodium chloride      LOS: 12 days   Merlene Laughter, DO [facility] Pager [phone]  If 7PM-7AM, please contact night-coverage [email or web address] Password [facility] 08/20/2017, 3:38 PM

## 2017-08-20 NOTE — Progress Notes (Signed)
Palliative Care   I followed-up today at the family's request. [name] has been transitioned to full comfort measures and is in the dying process. I had a good conversation with the patient's family today; we talked through the dying process, signs to monitor for, and how symptoms were being managed. I also provided education on ways they could participate in ensuring her comfort and being her advocate if they observed new symptoms. Her daughter was particularly concerned about her experiencing pain. Overnight the patient had a period of restlessness and it took some time to get the medication and for her to become comfortable again. We talked through options to manage this, with [name] ultimately wanting to schedule pain medication overnight to ensure some base pain coverage. Finally, we also talked about time. [name] is clearly actively dying. During my time with her she had periods of apnea, shallow respirations, and is now no longer responsive. She does continue to have warm extremities, but there is now mottling present. I continued to give the hours to days timeframe and reinforced our commitment to ensuring her comfort and dignity for whatever time she had left.   Plan -Start scheduled Morphine overnight, continue PRN doses -Will stop KVO fluids as line flushing should suffice to maintain access -Pt should remain here. She is not stable for transfer and I anticipate a Hospital death  Time in: 0915 Time out: 0935 Total time: 25 minutes  Esta [place], [facility] Palliative Care [phone]

## 2017-08-20 NOTE — Progress Notes (Signed)
Orthopedic Tech Progress Note Patient Details:  Danielle Benitez 06/13/1932 [id number]  Ortho Devices Type of Ortho Device: Velcro wrist splint Ortho Device/Splint Location: lue Ortho Device/Splint Interventions: Application   Cecile Guevara 08/20/2017, 9:48 AM

## 2017-08-21 DIAGNOSIS — R188 Other ascites: Secondary | ICD-10-CM

## 2017-08-21 DIAGNOSIS — K7460 Unspecified cirrhosis of liver: Secondary | ICD-10-CM

## 2017-08-21 DIAGNOSIS — T148XXA Other injury of unspecified body region, initial encounter: Secondary | ICD-10-CM

## 2017-08-24 ENCOUNTER — Telehealth: Payer: Self-pay | Admitting: Family Medicine

## 2017-08-24 NOTE — Telephone Encounter (Signed)
Called patient's daughter to offer my condolences.  She thanked me for the call.   I was always glad to see this kind lady in clinic and will miss seeing her.

## 2017-08-29 ENCOUNTER — Encounter (HOSPITAL_COMMUNITY): Payer: Medicare Other

## 2017-09-10 NOTE — Progress Notes (Signed)
Palliative Care   I arrived to the unit to check-on Mrs. Slatten. On my way to the room the patient's care nurse, Elita Quick, reported she had passed and was in the process of obtaining a second RN for confirmatory declaration. I arrived to the room and confirmed the absence  of pulse and respirations. Death declared at 08-26-2017 at 8:35am. I sat with the family briefly and provided emotional support. I also shared the process of next steps. They did not want a hospital chaplain called and wished to reach-out to their own community. I emphasized the allowance to sit with Mrs. Godwin and, when ready, alert the care nurse when they were ready for her to be bathed. Death certificate to be completed by primary team, who I paged.   Total time: 15 minutes  Greater than 50%  of this time was spent counseling and coordinating care related to the above assessment and plan.  Desmond Schuknecht [facility] Palliative Care [phone]

## 2017-09-10 NOTE — Death Summary Note (Signed)
DEATH SUMMARY   Patient Details  Name: Danielle Benitez MRN: [id number] DOB: June 02, 1932  Admission/Discharge Information   Admit Date:  2017-08-08  Date of Death: Date of Death: Aug 22, 2017  Time of Death: Time of Death: 0835  Length of Stay: 02/22/23  Referring Physician: Joaquim Nam, MD   Reason(s) for Hospitalization  Mechanical Fall with Left Wrist Pain, Pneumonia, AKI and GI Bleeding  Diagnoses  Preliminary cause of death:   Acute Cardiopulmonary Arrest 2/2 worsening Uremia and HCAP in the setting of New Liver Cirrhosis  Secondary Diagnoses (including complications and co-morbidities):   Left wrist fracture  HCAP  Acute metabolic encephalopathy  Diabetes Mellitus, type 2  CAD  Depression  Acute kidney injury on CKD stage IV  Essential hypertension  Normocytic anemia  Atrial Fibrillation  Thrombocytopenia  Chronic diastolic heart failure  History of CVA  History of dysphagia Emesis  Cirrhosis  Principal Problem:   Left wrist fracture, closed, initial encounter Active Problems:   DM, type 2, with renal complications    Mild CAD - 20-30% RCA in 02/18/2012   Depression   CKD (chronic kidney disease), stage IV (HCC)   Essential hypertension   Normocytic anemia   Atrial fibrillation - 07/11/15 by PPM interrogation, recurrent 10/19 after DCCV 10/14   Thrombocytopenia (HCC)   Chronic diastolic CHF (congestive heart failure) (HCC)   History of CVA (cerebrovascular accident)   Occult GI bleeding   HCAP (healthcare-associated pneumonia)   Closed fracture of left wrist   Fall   Palliative care by specialist   Goals of care, counseling/discussion   Terminal care   Pressure injury of skin   Brief Hospital Course (including significant findings, care, treatment, and services provided and events leading to death)  Danielle Benitez is a 83 y.o. year old female with medical history significant for insulin-dependent diabetes mellitus, hypertension, history of CVA,  atrial fibrillation on Eliquis, chronic diastolic CHF, and chronic kidney disease stage IV, and other comorbids who presented to the emergency department with severe left wrist pain after a mechanical fall.  The patient reports being in poor state of health in recent months with recurrent UTI and general malaise.  She was using her walker on a hill, lost control and fell, landing on an outstretched left arm.  She experienced immediate and severe pain at the left wrist and was brought in for evaluation.  Family reports some mild cough recently.  Denies chest pain or palpitations and denies melena or hematochezia.  There was no head strike or loss of consciousness associated with the fall.  Upon arrival to the ED, patient was found to be afebrile, saturating well on room air, and with vitals otherwise stable.  EKG features a paced rhythm with chronic RBBB and chronic LAFB.  Chest x-ray features low lung volumes with patchy atelectasis versus infiltrate in the left base.  Radiographs of the left wrist demonstrate a dorsally displaced and angulated fracture of the distal radial metaphysis and mildly displaced fracture of the distal ulna.  Noncontrast head CT is negative for acute intracranial abnormality or skull fracture. DRE produced brown stool that tests positive for occult blood.  Hand surgery was consulted by the ED physician and states that they plan to see the patient in the morning.  Patient was treated with 1.5 L of normal saline,, and empiric cefepime and vancomycin.  Blood cultures were collected. She was admitted to the telemetry unit for ongoing evaluation and management of left wrist fracture, pneumonia, acute  kidney injury superimposed on CKD stage IV, and occult GI bleeding.  Orthopedic surgery was consulted and opted for nonoperative management. Patient developed oliguric AKI and found to have cirrhosis with further workup. Patient declined fairly quickly and Palliative Care was Consulted. After  much discussion the goals of Care changed and the paatient was made Comfort Care. All medications not related to patient's comfort were discontinued. She was at High Risk for Decompensation. She remained unresponsive for several days and this morning she passed on 01/05/17 at 0835 AM. Family was present at bedside and have selected a funeral home. Condolences were relayed and family declined Hospital Chaplain visit.   Pertinent Labs and Studies  Significant Diagnostic Studies Dg Wrist Complete Left  Result Date: 08/08/2017 CLINICAL DATA:  Left wrist fracture. EXAM: LEFT WRIST - COMPLETE 3+ VIEW COMPARISON:  Radiographs of August 07, 2017. FINDINGS: The left wrist has been casted and immobilized. No significant changes seen involving moderately displaced distal left radial and ulnar fractures. Osteoarthritis of first carpometacarpal joint is again noted. IMPRESSION: Status post casting and immobilization of moderately displaced distal left radial and ulnar fractures. Electronically Signed   By: Lupita RaiderJames  Green Jr, M.D.   On: 08/08/2017 11:28   Dg Wrist Complete Left  Result Date: 07/18/2017 CLINICAL DATA:  83 year old female with fall and left wrist injury. EXAM: LEFT WRIST - COMPLETE 3+ VIEW COMPARISON:  None. FINDINGS: There is a displaced transverse fracture of the distal ulna. There is a comminuted appearing transverse fracture of the distal radial metaphysis with mild dorsal angulation and approximately 1/2 shaft width dorsal displacement. No definite intra-articular extension identified. There is no dislocation. The bones are osteopenic. There are degenerative changes of the carpal joints and osteoarthritic changes of the base of the thumb. There is diffuse soft tissue swelling of the wrist. IMPRESSION: Dorsally displaced and angulated fracture of the distal radial metaphysis as well as mildly displaced fracture of the distal ulna. Electronically Signed   By: Elgie CollardArash  Radparvar M.D.   On: 07/22/2017  21:58   Ct Head Wo Contrast  Result Date: 08/08/2017 CLINICAL DATA:  Posttraumatic headache. Fall striking right side of face. On anticoagulation. EXAM: CT HEAD WITHOUT CONTRAST TECHNIQUE: Contiguous axial images were obtained from the base of the skull through the vertex without intravenous contrast. COMPARISON:  Head CT and brain MRI 06/03/2017 FINDINGS: Brain: Stable degree of atrophy and chronic small vessel ischemia. Remote lacunar infarcts in the right basal ganglia, many of which are better appreciated on prior MRI. No intracranial hemorrhage, mass effect, or midline shift. No hydrocephalus. The basilar cisterns are patent. No evidence of territorial infarct or acute ischemia. No extra-axial or intracranial fluid collection. Vascular: Atherosclerosis of skullbase vasculature without hyperdense vessel or abnormal calcification. Skull: No fracture or focal lesion. Sinuses/Orbits: Paranasal sinuses and mastoid air cells are clear. The visualized orbits are unremarkable. Bilateral cataract resection. Other: None. IMPRESSION: 1.  No acute intracranial abnormality.  No skull fracture. 2. Stable atrophy and chronic small vessel ischemia. Electronically Signed   By: Rubye OaksMelanie  Ehinger M.D.   On: 08/08/2017 01:58   Dg Esophagus  Result Date: 08/11/2017 CLINICAL DATA:  3 week history of nausea and vomiting.  Weight loss. EXAM: ESOPHOGRAM/BARIUM SWALLOW TECHNIQUE: Single contrast examination was performed using  thin barium. FLUOROSCOPY TIME:  Fluoroscopy Time:  1.4 minutes Radiation Exposure Index (if provided by the fluoroscopic device): 12.8 mGy Number of Acquired Spot Images: 0 COMPARISON:  None. FINDINGS: Limited examination secondary to decreased patient mobility. Entire examination  was performed in the semi upright position. There was normal pharyngeal anatomy and motility. Contrast flowed freely through the esophagus without evidence of stricture or mass. There was normal esophageal mucosa without  evidence of irregularity or ulceration. Diffuse tertiary contractions throughout the esophagus consistent with spasm. No evidence of reflux. No definite hiatal hernia was demonstrated. IMPRESSION: 1. Diffuse esophageal spasm. 2. No esophageal stricture. Electronically Signed   By: Elige Ko   On: 08/11/2017 14:22   US Renal  Result Date: 08/11/2017 CLINICAL DATA:  Acute renal injury. EXAM: RENAL / URINARY TRACT ULTRASOUND COMPLETE COMPARISON:  Ultrasound 07/21/2015 . FINDINGS: Right Kidney: Length: 7.9 cm. Right kidney is small and echogenic consistent with chronic medical renal disease and atrophy. No mass. No hydronephrosis . Left Kidney: Length: 7.9 cm. Left kidney is small and echogenic consistent with chronic medical renal disease and atrophy. No mass. No hydronephrosis. Bladder: Appears normal for degree of bladder distention. Mild ascites. Left pleural effusion noted. IMPRESSION: 1. Kidneys are small and echogenic consistent chronic medical renal disease and atrophy. 2.  No hydronephrosis.  No bladder distention. 3.  Mild ascites.  Left pleural effusion noted. Electronically Signed   By: Maisie Fus  Register   On: 08/11/2017 11:57   US Abdomen Limited  Result Date: 08/13/2017 CLINICAL DATA:  83 year old female with limited left upper quadrant ultrasound to evaluate for splenomegaly. EXAM: ULTRASOUND ABDOMEN LIMITED COMPARISON:  None. FINDINGS: The spleen appears unremarkable and demonstrates normal echotexture. It measures 8.1 x 10.4 x 5.2 cm. Overall splenic volume is 230 cc. This is within normal limits. No focal masses are identified. Incidental note is made of a left pleural effusion. IMPRESSION: 1. Normal size and appearance of the spleen. 2. Incidental left pleural effusion. Electronically Signed   By: Sande Brothers M.D.   On: 08/13/2017 13:10   US Abdomen Limited  Result Date: 08/12/2017 CLINICAL DATA:  Elevated LFTs.  Nausea and vomiting.  Ascites. EXAM: ULTRASOUND ABDOMEN LIMITED RIGHT  UPPER QUADRANT COMPARISON:  None. FINDINGS: Gallbladder: Gallbladder is physiologically distended. No gallstones or wall thickening visualized. No sonographic Murphy sign noted by sonographer. Common bile duct: Diameter: 2 mm, normal. Liver: Shrunken with nodular contours. There is increased coarsened echogenicity. No focal lesion demonstrated sonographically. Portal vein is patent on color Doppler imaging with normal direction of blood flow towards the liver. Intra-abdominal ascites greatest in the right abdomen, small to moderate in degree. IMPRESSION: 1. Cirrhotic hepatic morphology. No focal lesion demonstrated sonographically. 2. Normal sonographic appearance of the gallbladder and biliary tree. 3. Ascites. Electronically Signed   By: Rubye Oaks M.D.   On: 08/12/2017 21:41   Dg Chest Port 1 View  Result Date: 07/29/2017 CLINICAL DATA:  Chest contusion EXAM: PORTABLE CHEST 1 VIEW COMPARISON:  07/08/2017, 5 7,018 FINDINGS: Very low lung volumes. Patchy atelectasis or infiltrate at the left base. No large effusion. Stable enlarged cardiomediastinal silhouette. No pneumothorax. Right-sided pacing device as before. IMPRESSION: Low lung volumes with patchy atelectasis or infiltrate at the left lung base. Cardiomegaly. Electronically Signed   By: Jasmine Pang M.D.   On: 07/18/2017 23:17   Dg Shoulder Left Portable  Result Date: 08/08/2017 CLINICAL DATA:  Shoulder pain, fall EXAM: LEFT SHOULDER - 1 VIEW COMPARISON:  06/24/2016 FINDINGS: No dislocation. Mild AC joint degenerative change. No definitive fracture. Marked arthritis at the glenohumeral interval. Slightly high riding appearance of the humeral head. IMPRESSION: 1. No definite acute osseous abnormality 2. Advanced arthritis at the left shoulder. Narrowed subacromial space consistent with  rotator cuff disease Electronically Signed   By: Jasmine Pang M.D.   On: 08/08/2017 00:52   Dg Abd Portable 1v  Result Date: 08/23/2017 CLINICAL DATA:   83 year old female with a history of vomiting EXAM: PORTABLE ABDOMEN - 1 VIEW COMPARISON:  07/30/2017, 07/08/2017 FINDINGS: Gas within stomach, small bowel, colon. Small amount of retained enteric contrast within small bowel and colon. No abnormal distention. The gas extends to the rectum. Multilevel degenerative changes of the spine with associated scoliotic curvature. No unexpected soft tissue density. No unexpected radiopaque foreign body. Pelvic calcifications. Calcification the left abdomen may reflect nephrolithiasis or calcification within the fecal stream. IMPRESSION: Nonobstructive bowel gas pattern with retained enteric contrast within small bowel and colon. Rounded calcification in the left abdomen may reflect calcification within the fecal stream or potentially nephrolithiasis. Electronically Signed   By: Gilmer Mor D.O.   On: August 23, 2017 12:58   DEATH EXAM: Eyes: Pupils fixed and dilated. No response to threat and Pupils not reactive Cardiovascular: No ausculted Heart Sounds when listening for over 1 minute. No palpable pulses Lungs: No spontaneous respirations, Lung sounds Absent Skin: Cyanotic and Mottled  Microbiology No results found for this or any previous visit (from the past 240 hour(s)).  Lab Basic Metabolic Panel: Recent Labs  Lab 08/15/17 0339  NA 134*  K 4.0  CL 104  CO2 19*  GLUCOSE 117*  BUN 80*  CREATININE 4.40*  CALCIUM 8.2*  PHOS 5.2*   Liver Function Tests: Recent Labs  Lab 08/15/17 0339  ALBUMIN 1.6*   No results for input(s): LIPASE, AMYLASE in the last 168 hours. No results for input(s): AMMONIA in the last 168 hours. CBC: Recent Labs  Lab 08/15/17 0339  WBC 10.2  HGB 9.1*  HCT 28.2*  MCV 95.3  PLT 55*   Cardiac Enzymes: No results for input(s): CKTOTAL, CKMB, CKMBINDEX, TROPONINI in the last 168 hours. Sepsis Labs: Recent Labs  Lab 08/15/17 0339  WBC 10.2    Procedures/Operations  None  [facility] Sep 01, 2017, 12:17  PM

## 2017-09-10 DEATH — deceased

## 2019-02-26 ENCOUNTER — Other Ambulatory Visit: Payer: Self-pay

## 2019-05-12 IMAGING — CT CT HEAD W/O CM
4 series · 17 of 47 positions shown, 19 images · non-contrast
Comparison: MRI 03/31/2017, CT 03/31/2017

CLINICAL DATA: 85-year-old female with a history of altered mental
status

EXAM:
CT HEAD WITHOUT CONTRAST
TECHNIQUE: Contiguous axial images were obtained from the base of the skull
through the vertex without intravenous contrast.

[Series 3: head wo · axial · 0.39mm/px · z∈[+1184,+1298]mm · 7 of 31 slices shown, 9 images]
[im 4/31  brain]
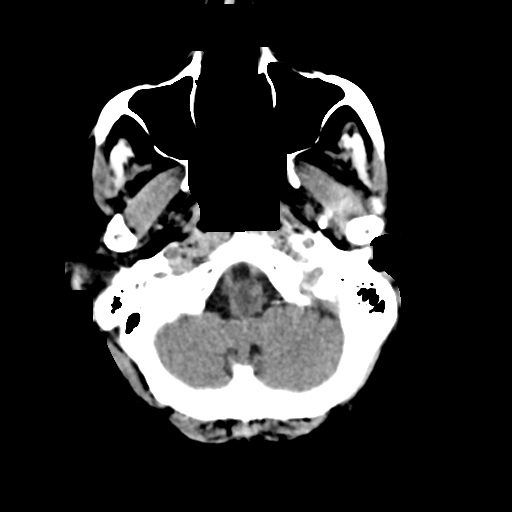
[im 4/31  bone]
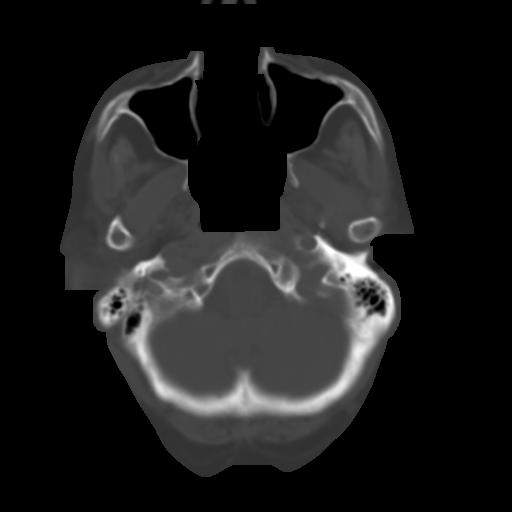
[im 8/31  brain]
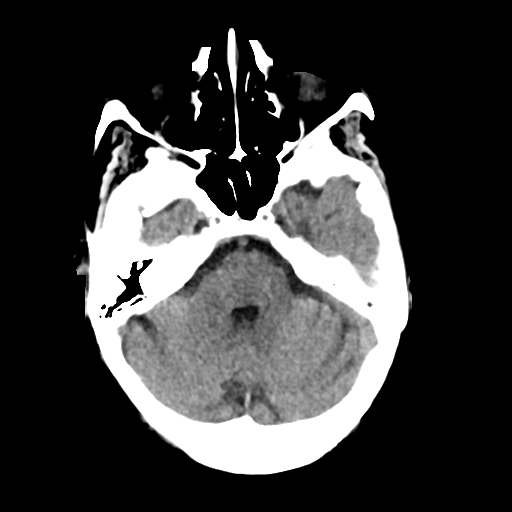
[im 12/31  brain]
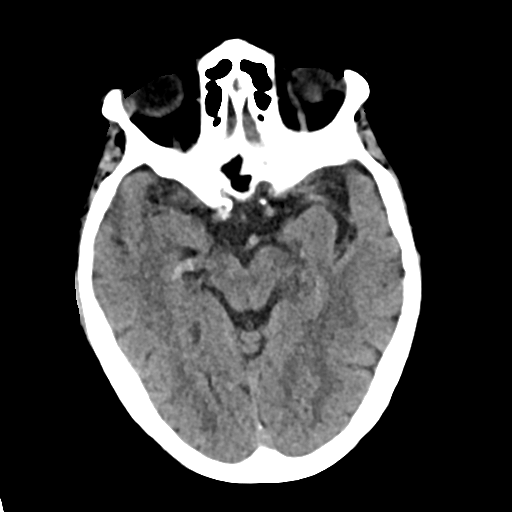
[im 16/31  brain]
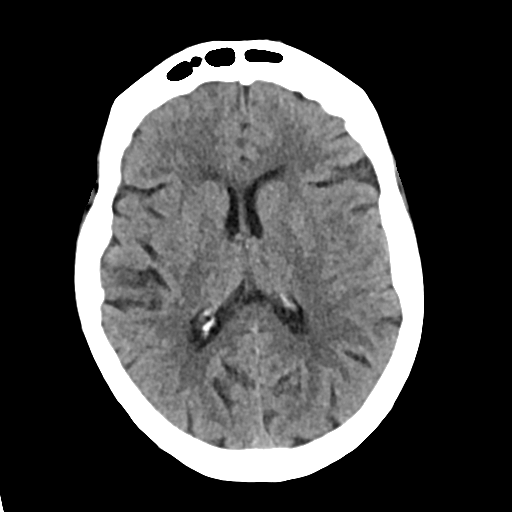
[im 19/31  brain]
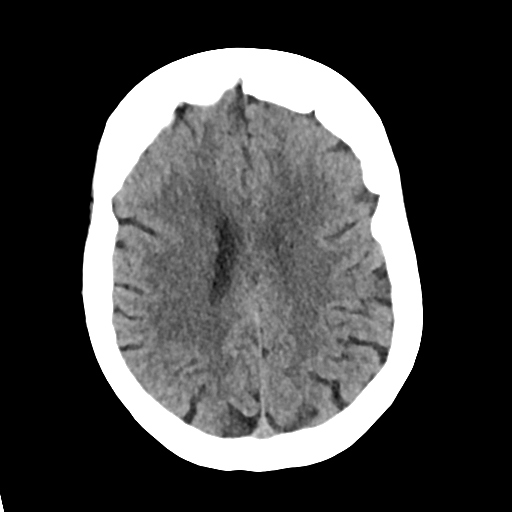
[im 19/31  bone]
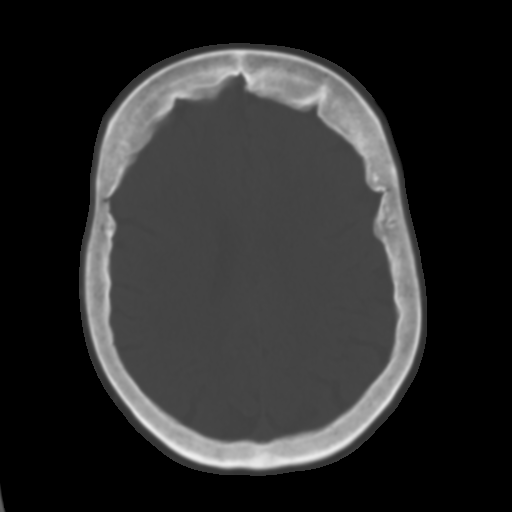
[im 23/31  brain]
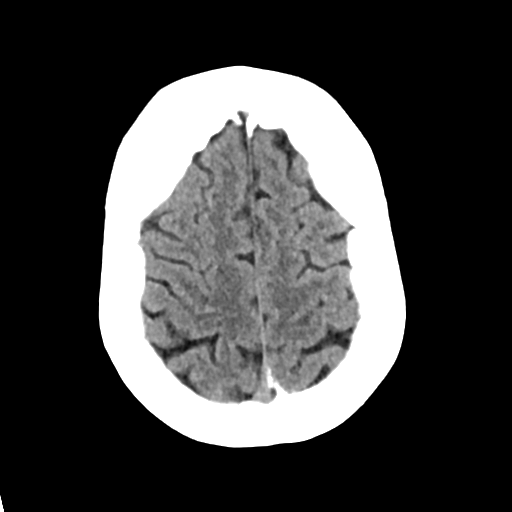
[im 27/31  brain]
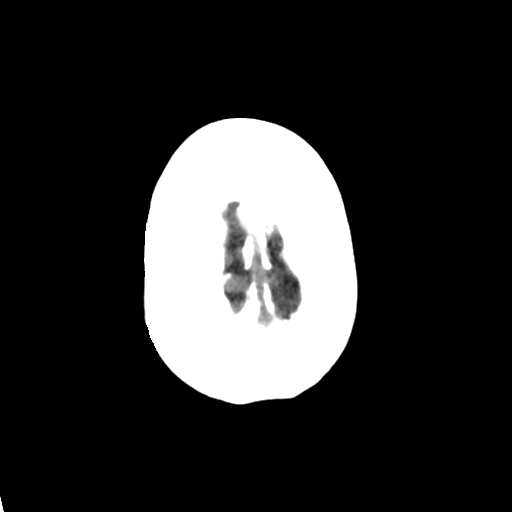

[Series 4: head bone · axial · 0.39mm/px · z∈[+1182,+1236]mm · 4 of 77 slices shown]
[im 8/77  bone]
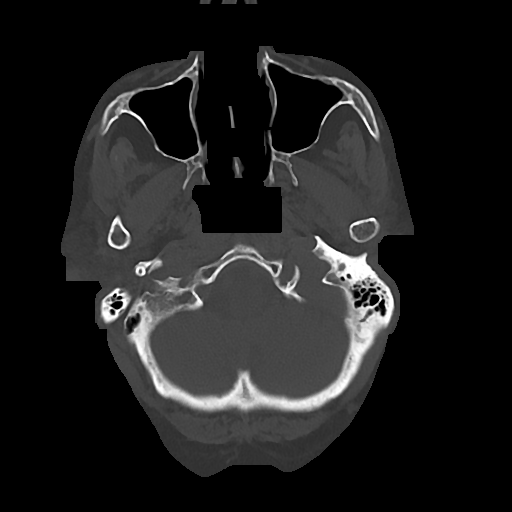
[im 16/77  bone]
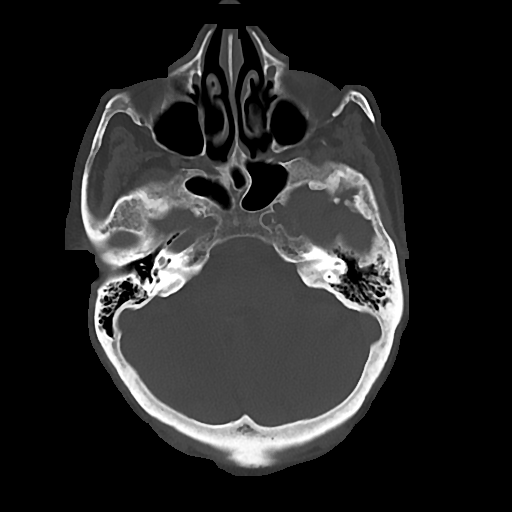
[im 23/77  bone]
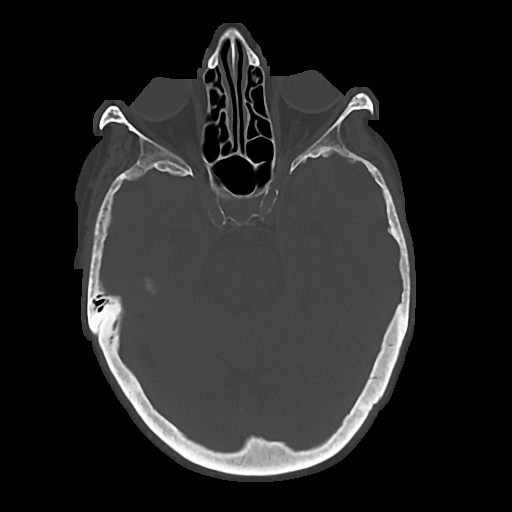
[im 35/77  bone]
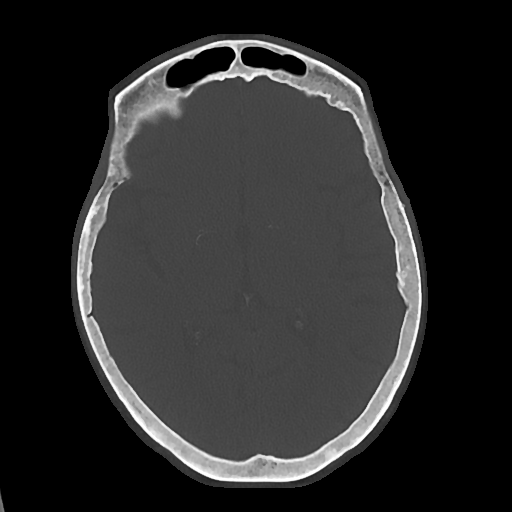

[Series 5: cor soft · coronal · 0.30mm/px · 3 of 63 slices shown]
[im 21/63  brain]
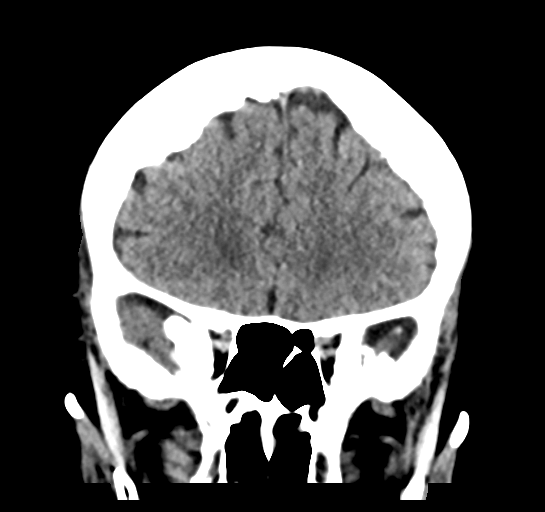
[im 28/63  brain]
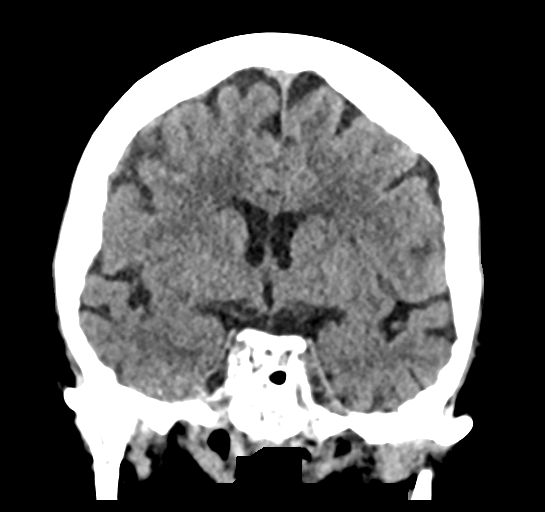
[im 35/63  brain]
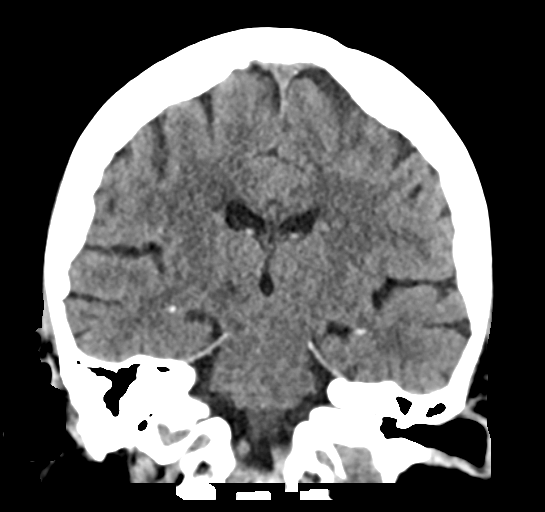

[Series 6: sag soft · sagittal · 0.30mm/px · 3 of 54 slices shown]
[im 18/54  brain]
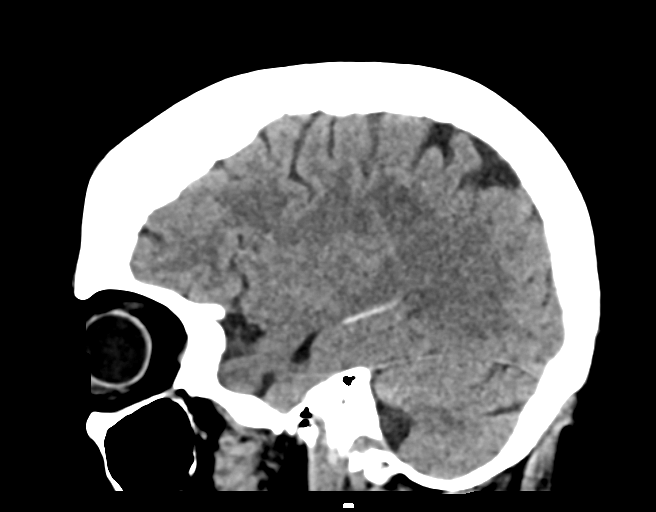
[im 27/54  brain]
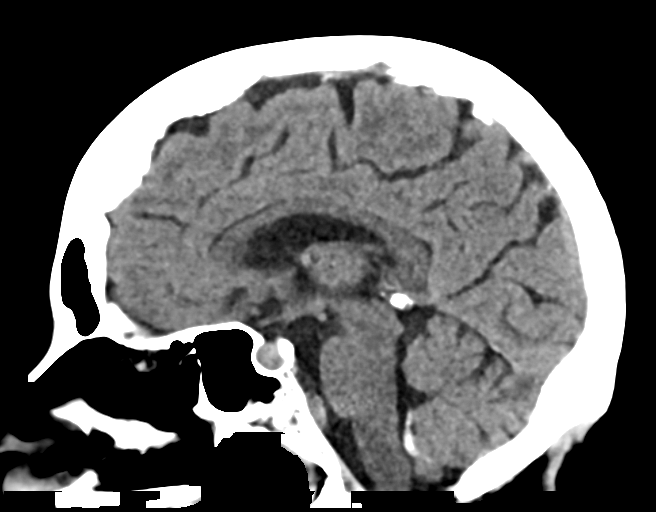
[im 36/54  brain]
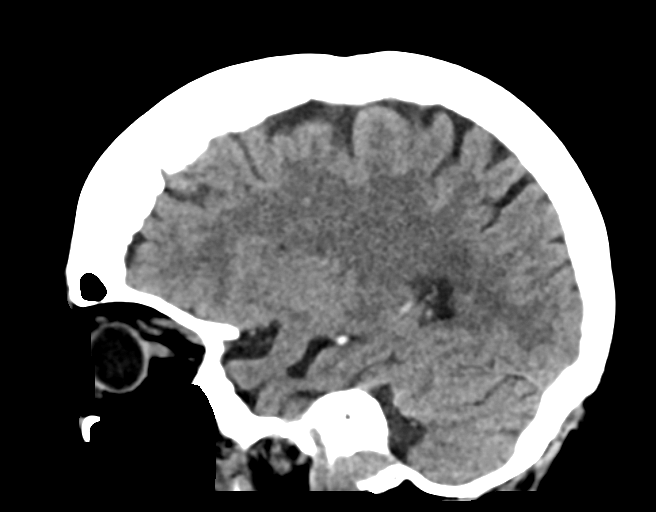

[17 of 47 positions shown; findings below may reference images not displayed]

FINDINGS: Brain: No acute intracranial hemorrhage. No midline shift or mass
effect. Unremarkable configuration the ventricles. Gray-white
differentiation maintained.

Vascular: Atherosclerotic calcification of anterior and posterior
circulation. Senescent calcifications of the basal ganglia

Skull: No aggressive bony lesion.

Sinuses/Orbits: No acute fracture.

Other: None
IMPRESSION: No CT evidence of acute intracranial abnormality.
